# Patient Record
Sex: Male | Born: 1955
Health system: Southern US, Community
[De-identification: ages and names within clinical notes are randomized; demographics above are authoritative.]

## PROBLEM LIST (undated history)

## (undated) DIAGNOSIS — I420 Dilated cardiomyopathy: Secondary | ICD-10-CM

## (undated) DIAGNOSIS — R51 Headache: Secondary | ICD-10-CM

## (undated) DIAGNOSIS — Z9289 Personal history of other medical treatment: Secondary | ICD-10-CM

## (undated) DIAGNOSIS — F329 Major depressive disorder, single episode, unspecified: Secondary | ICD-10-CM

## (undated) DIAGNOSIS — R519 Headache, unspecified: Secondary | ICD-10-CM

## (undated) DIAGNOSIS — F32A Depression, unspecified: Secondary | ICD-10-CM

## (undated) DIAGNOSIS — E7800 Pure hypercholesterolemia, unspecified: Secondary | ICD-10-CM

## (undated) DIAGNOSIS — N184 Chronic kidney disease, stage 4 (severe): Secondary | ICD-10-CM

## (undated) DIAGNOSIS — Z5900 Homelessness unspecified: Secondary | ICD-10-CM

## (undated) DIAGNOSIS — Z9119 Patient's noncompliance with other medical treatment and regimen: Secondary | ICD-10-CM

## (undated) DIAGNOSIS — R3911 Hesitancy of micturition: Secondary | ICD-10-CM

## (undated) DIAGNOSIS — Z91199 Patient's noncompliance with other medical treatment and regimen due to unspecified reason: Secondary | ICD-10-CM

## (undated) DIAGNOSIS — J189 Pneumonia, unspecified organism: Secondary | ICD-10-CM

## (undated) DIAGNOSIS — F419 Anxiety disorder, unspecified: Secondary | ICD-10-CM

## (undated) DIAGNOSIS — M1990 Unspecified osteoarthritis, unspecified site: Secondary | ICD-10-CM

## (undated) DIAGNOSIS — I214 Non-ST elevation (NSTEMI) myocardial infarction: Secondary | ICD-10-CM

## (undated) DIAGNOSIS — F1910 Other psychoactive substance abuse, uncomplicated: Secondary | ICD-10-CM

## (undated) DIAGNOSIS — I255 Ischemic cardiomyopathy: Secondary | ICD-10-CM

## (undated) DIAGNOSIS — I219 Acute myocardial infarction, unspecified: Secondary | ICD-10-CM

## (undated) DIAGNOSIS — I10 Essential (primary) hypertension: Secondary | ICD-10-CM

## (undated) HISTORY — DX: Dilated cardiomyopathy: I42.0

---

## 2013-02-18 ENCOUNTER — Encounter (HOSPITAL_COMMUNITY): Payer: Self-pay | Admitting: *Deleted

## 2013-02-18 ENCOUNTER — Emergency Department (HOSPITAL_COMMUNITY)
Admission: EM | Admit: 2013-02-18 | Discharge: 2013-02-18 | Disposition: A | Discharge status: Home / Self Care | Payer: Self-pay | Attending: Emergency Medicine | Admitting: Emergency Medicine

## 2013-02-18 DIAGNOSIS — Z87891 Personal history of nicotine dependence: Secondary | ICD-10-CM | POA: Insufficient documentation

## 2013-02-18 DIAGNOSIS — R079 Chest pain, unspecified: Secondary | ICD-10-CM | POA: Insufficient documentation

## 2013-02-18 DIAGNOSIS — M25519 Pain in unspecified shoulder: Secondary | ICD-10-CM | POA: Insufficient documentation

## 2013-02-18 DIAGNOSIS — H9209 Otalgia, unspecified ear: Secondary | ICD-10-CM | POA: Insufficient documentation

## 2013-02-18 DIAGNOSIS — H9201 Otalgia, right ear: Secondary | ICD-10-CM

## 2013-02-18 DIAGNOSIS — M542 Cervicalgia: Secondary | ICD-10-CM | POA: Insufficient documentation

## 2013-02-18 DIAGNOSIS — I10 Essential (primary) hypertension: Secondary | ICD-10-CM | POA: Insufficient documentation

## 2013-02-18 DIAGNOSIS — Z7982 Long term (current) use of aspirin: Secondary | ICD-10-CM | POA: Insufficient documentation

## 2013-02-18 DIAGNOSIS — M25512 Pain in left shoulder: Secondary | ICD-10-CM

## 2013-02-18 DIAGNOSIS — Z79899 Other long term (current) drug therapy: Secondary | ICD-10-CM | POA: Insufficient documentation

## 2013-02-18 HISTORY — DX: Essential (primary) hypertension: I10

## 2013-02-18 LAB — POCT I-STAT TROPONIN I

## 2013-02-18 MED ORDER — HYDROCODONE-ACETAMINOPHEN 5-325 MG PO TABS
2.0000 | ORAL_TABLET | Freq: Once | ORAL | Status: AC
  Administered 2013-02-18: 2 via ORAL
  Filled 2013-02-18: qty 2

## 2013-02-18 MED ORDER — NAPROXEN 375 MG PO TABS: 375.0000 mg | ORAL_TABLET | Freq: Two times a day (BID) | ORAL | Status: DC

## 2013-02-18 MED ORDER — ONDANSETRON 4 MG PO TBDP
4.0000 mg | ORAL_TABLET | Freq: Once | ORAL | Status: AC
  Administered 2013-02-18: 4 mg via ORAL
  Filled 2013-02-18: qty 1

## 2013-02-18 NOTE — ED Provider Notes (Signed)
CSN: [id number]     Arrival date & time 02/18/13  1703 History     First MD Initiated Contact with Patient 02/18/13 1905     Chief Complaint  Patient presents with  . Otalgia  . Neck Pain  . Chest Pain   (Consider location/radiation/quality/duration/timing/severity/associated sxs/prior Treatment) HPI   [name] is a 57 y.o.male with a significant PMH of hypertension presents to the ER with complaints of pt complains of possible foreign body in ear from 4 weeks ago and left shoulder pain for 3-4 weeks.  Ear pain- patient was eating in his right ear with a plastic fork and a piece of it broke off. He did not have any bleeding him a discharge or decrease in hearing. He says that he does not know what happened to it intermittently his ear has pain.  Shoulder pain- patient is a Retail buyer and says that for the past 3-4 weeks his left side of his neck, shoulder, left arm, left back has been causing him discomfort only when he moves. He has no pain or discomfort when he is at rest. He is tried Aleve with some relief. He denies ever having this before but says he gets real sore after work. Denies any shortness of breath, wheezing, chest pain, nausea, diaphoresis, lethargy.  Past Medical History  Diagnosis Date  . Hypertension    History reviewed. No pertinent past surgical history. No family history on file. History  Substance Use Topics  . Smoking status: Former Smoker    Quit date: 02/19/2004  . Smokeless tobacco: Not on file  . Alcohol Use: Yes    Review of Systems ROS is negative unless otherwise stated in the HPI\  Allergies  Review of patient's allergies indicates no known allergies.  Home Medications   Current Outpatient Rx  Name  Route  Sig  Dispense  Refill  . aspirin EC 81 MG tablet   Oral   Take 81 mg by mouth daily.         . hydrochlorothiazide (HYDRODIURIL) 25 MG tablet   Oral   Take 25 mg by mouth daily.         . naproxen (NAPROSYN) 375 MG tablet  Oral   Take 1 tablet (375 mg total) by mouth 2 (two) times daily with a meal.   40 tablet   0    BP 178/97  Pulse 58  Temp(Src) 98 F (36.7 C) (Oral)  Resp 16  SpO2 100% Physical Exam  Nursing note and vitals reviewed. Constitutional: He appears well-developed and well-nourished. No distress.  HENT:  Head: Normocephalic and atraumatic.  Eyes: Pupils are equal, round, and reactive to light.  Neck: Normal range of motion. Neck supple.  Cardiovascular: Normal rate and regular rhythm.   Pulmonary/Chest: Effort normal.  Abdominal: Soft.  Musculoskeletal:       Left shoulder: He exhibits pain. He exhibits normal range of motion, no tenderness, no bony tenderness, no swelling, no effusion, no crepitus, no deformity, no laceration, no spasm, normal pulse and normal strength.  Pain with ROM  Neurological: He is alert.  Skin: Skin is warm and dry.    ED Course   Procedures (including critical care time)  Labs Reviewed  POCT I-STAT TROPONIN I   No results found. 1. Otalgia of right ear   2. Shoulder joint pain, left     MDM   Date: 02/18/2013  Rate: 60  Rhythm: sinus rhythm  QRS Axis: normal  Intervals: normal  ST/T Wave  abnormalities: normal  Conduction Disutrbances: right bundle branch block  Narrative Interpretation:   Old EKG Reviewed: unavailable  Patient given 2 Vicodin in the emergency department he said he has significantly helped his pain. Will start him on Naprosyn twice a day and he's been told to use food when he takes this otherwise he is at risk for stomach upset.   57 y.o.[name]'s evaluation in the Emergency Department is complete. It has been determined that no acute conditions requiring further emergency intervention are present at this time. The patient/guardian have been advised of the diagnosis and plan. We have discussed signs and symptoms that warrant return to the ED, such as changes or worsening in symptoms.  Vital signs are stable at  discharge. Filed Vitals:   02/18/13 2114  BP: 178/97  Pulse: 60  Temp:   Resp: 17    Patient/guardian has voiced understanding and agreed to follow-up with the PCP or specialist.    Linus Mako, PA-C 02/19/13 0012  Linus Mako, PA-C 02/19/13 [id number]

## 2013-02-18 NOTE — ED Notes (Signed)
Pt BP elevated; pt stated that he ran out of BP medication since Saturday. Will advise EDP

## 2013-02-18 NOTE — ED Notes (Signed)
Pt c/o R ear pain r/t when he was digging in his hear with the broken-off prong of a fork and it lodged in his ear (several weeks ago).  Pt also c/o L neck pain that radiates down shoulder and to L back (pain increases with movement).

## 2013-02-19 NOTE — ED Provider Notes (Signed)
Medical screening examination/treatment/procedure(s) were performed by non-physician practitioner and as supervising physician I was immediately available for consultation/collaboration.  Hoy Morn, MD 02/19/13 [phone]

## 2014-07-18 DIAGNOSIS — I214 Non-ST elevation (NSTEMI) myocardial infarction: Secondary | ICD-10-CM

## 2014-07-18 DIAGNOSIS — I420 Dilated cardiomyopathy: Secondary | ICD-10-CM

## 2014-07-18 DIAGNOSIS — J189 Pneumonia, unspecified organism: Secondary | ICD-10-CM

## 2014-07-18 HISTORY — DX: Pneumonia, unspecified organism: J18.9

## 2014-07-18 HISTORY — DX: Non-ST elevation (NSTEMI) myocardial infarction: I21.4

## 2014-07-18 HISTORY — DX: Dilated cardiomyopathy: I42.0

## 2014-08-13 ENCOUNTER — Encounter (HOSPITAL_COMMUNITY): Payer: Self-pay | Admitting: *Deleted

## 2014-08-13 ENCOUNTER — Emergency Department (HOSPITAL_COMMUNITY): Payer: Medicaid Other

## 2014-08-13 ENCOUNTER — Inpatient Hospital Stay (HOSPITAL_COMMUNITY)
Admission: EM | Admit: 2014-08-13 | Discharge: 2014-08-16 | DRG: 287 | Disposition: A | Discharge status: Home / Self Care | Payer: Medicaid Other | Attending: Cardiology | Admitting: Cardiology

## 2014-08-13 DIAGNOSIS — R0602 Shortness of breath: Secondary | ICD-10-CM

## 2014-08-13 DIAGNOSIS — Z590 Homelessness: Secondary | ICD-10-CM | POA: Diagnosis not present

## 2014-08-13 DIAGNOSIS — I249 Acute ischemic heart disease, unspecified: Secondary | ICD-10-CM | POA: Diagnosis present

## 2014-08-13 DIAGNOSIS — F1010 Alcohol abuse, uncomplicated: Secondary | ICD-10-CM | POA: Diagnosis present

## 2014-08-13 DIAGNOSIS — I739 Peripheral vascular disease, unspecified: Secondary | ICD-10-CM | POA: Diagnosis present

## 2014-08-13 DIAGNOSIS — F1410 Cocaine abuse, uncomplicated: Secondary | ICD-10-CM | POA: Diagnosis present

## 2014-08-13 DIAGNOSIS — I4510 Unspecified right bundle-branch block: Secondary | ICD-10-CM | POA: Diagnosis present

## 2014-08-13 DIAGNOSIS — I129 Hypertensive chronic kidney disease with stage 1 through stage 4 chronic kidney disease, or unspecified chronic kidney disease: Secondary | ICD-10-CM | POA: Diagnosis present

## 2014-08-13 DIAGNOSIS — R7989 Other specified abnormal findings of blood chemistry: Secondary | ICD-10-CM

## 2014-08-13 DIAGNOSIS — Z87891 Personal history of nicotine dependence: Secondary | ICD-10-CM | POA: Diagnosis not present

## 2014-08-13 DIAGNOSIS — F419 Anxiety disorder, unspecified: Secondary | ICD-10-CM | POA: Diagnosis present

## 2014-08-13 DIAGNOSIS — R778 Other specified abnormalities of plasma proteins: Secondary | ICD-10-CM

## 2014-08-13 DIAGNOSIS — N184 Chronic kidney disease, stage 4 (severe): Secondary | ICD-10-CM | POA: Diagnosis present

## 2014-08-13 DIAGNOSIS — N179 Acute kidney failure, unspecified: Secondary | ICD-10-CM | POA: Diagnosis present

## 2014-08-13 DIAGNOSIS — I420 Dilated cardiomyopathy: Secondary | ICD-10-CM | POA: Diagnosis present

## 2014-08-13 DIAGNOSIS — E119 Type 2 diabetes mellitus without complications: Secondary | ICD-10-CM | POA: Diagnosis present

## 2014-08-13 LAB — CBC WITH DIFFERENTIAL/PLATELET
Basophils Absolute: 0 10*3/uL (ref 0.0–0.1)
Basophils Relative: 1 % (ref 0–1)
EOS ABS: 0.1 10*3/uL (ref 0.0–0.7)
Eosinophils Relative: 1 % (ref 0–5)
HEMATOCRIT: 44.7 % (ref 39.0–52.0)
HEMOGLOBIN: 15.1 g/dL (ref 13.0–17.0)
LYMPHS ABS: 1.9 10*3/uL (ref 0.7–4.0)
LYMPHS PCT: 35 % (ref 12–46)
MCH: 34.4 pg — ABNORMAL HIGH (ref 26.0–34.0)
MCHC: 33.8 g/dL (ref 30.0–36.0)
MCV: 101.8 fL — ABNORMAL HIGH (ref 78.0–100.0)
MONOS PCT: 8 % (ref 3–12)
Monocytes Absolute: 0.5 10*3/uL (ref 0.1–1.0)
NEUTROS PCT: 55 % (ref 43–77)
Neutro Abs: 3.1 10*3/uL (ref 1.7–7.7)
PLATELETS: 215 10*3/uL (ref 150–400)
RBC: 4.39 MIL/uL (ref 4.22–5.81)
RDW: 12.3 % (ref 11.5–15.5)
WBC: 5.6 10*3/uL (ref 4.0–10.5)

## 2014-08-13 LAB — BASIC METABOLIC PANEL
Anion gap: 7 (ref 5–15)
Anion gap: 9 (ref 5–15)
Anion gap: 9 (ref 5–15)
BUN: 25 mg/dL — AB (ref 6–23)
BUN: 24 mg/dL — ABNORMAL HIGH (ref 6–23)
BUN: 27 mg/dL — AB (ref 6–23)
CO2: 23 mmol/L (ref 19–32)
CO2: 20 mmol/L (ref 19–32)
CO2: 20 mmol/L (ref 19–32)
CREATININE: 1.93 mg/dL — AB (ref 0.50–1.35)
CREATININE: 1.92 mg/dL — AB (ref 0.50–1.35)
Calcium: 8.9 mg/dL (ref 8.4–10.5)
Calcium: 8.8 mg/dL (ref 8.4–10.5)
Calcium: 8.8 mg/dL (ref 8.4–10.5)
Chloride: 107 mmol/L (ref 96–112)
Chloride: 108 mmol/L (ref 96–112)
Chloride: 108 mmol/L (ref 96–112)
Creatinine, Ser: 1.94 mg/dL — ABNORMAL HIGH (ref 0.50–1.35)
GFR calc Af Amer: 42 mL/min — ABNORMAL LOW (ref >90)
GFR calc Af Amer: 42 mL/min — ABNORMAL LOW (ref >90)
GFR calc Af Amer: 42 mL/min — ABNORMAL LOW (ref >90)
GFR calc non Af Amer: 36 mL/min — ABNORMAL LOW (ref >90)
GFR calc non Af Amer: 36 mL/min — ABNORMAL LOW (ref >90)
GFR, EST NON AFRICAN AMERICAN: 36 mL/min — AB (ref >90)
GLUCOSE: 108 mg/dL — AB (ref 70–99)
Glucose, Bld: 174 mg/dL — ABNORMAL HIGH (ref 70–99)
Glucose, Bld: 151 mg/dL — ABNORMAL HIGH (ref 70–99)
POTASSIUM: 4 mmol/L (ref 3.5–5.1)
POTASSIUM: 4.6 mmol/L (ref 3.5–5.1)
POTASSIUM: 4.6 mmol/L (ref 3.5–5.1)
SODIUM: 137 mmol/L (ref 135–145)
SODIUM: 137 mmol/L (ref 135–145)
SODIUM: 137 mmol/L (ref 135–145)

## 2014-08-13 LAB — APTT: aPTT: 26 seconds (ref 24–37)

## 2014-08-13 LAB — TROPONIN I
TROPONIN I: 1.74 ng/mL — AB (ref <0.031)
Troponin I: 1.64 ng/mL (ref <0.031)
Troponin I: 0.64 ng/mL (ref <0.031)

## 2014-08-13 LAB — HEPARIN LEVEL (UNFRACTIONATED): Heparin Unfractionated: <0.1 IU/mL — ABNORMAL LOW (ref 0.30–0.70)

## 2014-08-13 LAB — PROTIME-INR
INR: 1.16 (ref 0.00–1.49)
Prothrombin Time: 14.9 seconds (ref 11.6–15.2)

## 2014-08-13 LAB — MRSA PCR SCREENING: MRSA by PCR: NEGATIVE

## 2014-08-13 MED ORDER — NITROGLYCERIN 0.4 MG SL SUBL: 0.4000 mg | SUBLINGUAL_TABLET | SUBLINGUAL | Status: DC | PRN

## 2014-08-13 MED ORDER — MORPHINE SULFATE 2 MG/ML IJ SOLN: 2.0000 mg | Freq: Four times a day (QID) | INTRAMUSCULAR | Status: DC | PRN

## 2014-08-13 MED ORDER — METOPROLOL TARTRATE 50 MG PO TABS
50.0000 mg | ORAL_TABLET | Freq: Two times a day (BID) | ORAL | Status: DC
  Administered 2014-08-13 – 2014-08-14 (×2): 50 mg via ORAL
  Filled 2014-08-13 (×2): qty 1

## 2014-08-13 MED ORDER — HEPARIN (PORCINE) IN NACL 100-0.45 UNIT/ML-% IJ SOLN
950.0000 [IU]/h | INTRAMUSCULAR | Status: DC
  Administered 2014-08-13: 950 [IU]/h via INTRAVENOUS
  Filled 2014-08-13 (×2): qty 250

## 2014-08-13 MED ORDER — ASPIRIN 81 MG PO CHEW
324.0000 mg | CHEWABLE_TABLET | Freq: Once | ORAL | Status: DC
  Filled 2014-08-13: qty 4

## 2014-08-13 MED ORDER — ASPIRIN EC 81 MG PO TBEC
81.0000 mg | DELAYED_RELEASE_TABLET | Freq: Every day | ORAL | Status: DC
  Administered 2014-08-16: 81 mg via ORAL
  Filled 2014-08-13: qty 1

## 2014-08-13 MED ORDER — HEPARIN BOLUS VIA INFUSION
4000.0000 [IU] | Freq: Once | INTRAVENOUS | Status: AC
  Administered 2014-08-13: 4000 [IU] via INTRAVENOUS
  Filled 2014-08-13: qty 4000

## 2014-08-13 MED ORDER — SODIUM CHLORIDE 0.9 % IV SOLN
1.0000 mL/kg/h | INTRAVENOUS | Status: DC
  Administered 2014-08-13: 1 mL/kg/h via INTRAVENOUS

## 2014-08-13 MED ORDER — METOPROLOL TARTRATE 25 MG PO TABS
25.0000 mg | ORAL_TABLET | Freq: Two times a day (BID) | ORAL | Status: DC
  Administered 2014-08-13: 25 mg via ORAL
  Filled 2014-08-13: qty 1

## 2014-08-13 MED ORDER — HEPARIN (PORCINE) IN NACL 100-0.45 UNIT/ML-% IJ SOLN
1150.0000 [IU]/h | INTRAMUSCULAR | Status: DC
  Administered 2014-08-13 – 2014-08-14 (×2): 1150 [IU]/h via INTRAVENOUS
  Filled 2014-08-13: qty 250

## 2014-08-13 MED ORDER — ASPIRIN 81 MG PO CHEW
324.0000 mg | CHEWABLE_TABLET | ORAL | Status: AC
  Filled 2014-08-13: qty 4

## 2014-08-13 MED ORDER — LORAZEPAM 1 MG PO TABS
1.0000 mg | ORAL_TABLET | Freq: Four times a day (QID) | ORAL | Status: DC | PRN
  Administered 2014-08-13 (×2): 1 mg via ORAL
  Filled 2014-08-13 (×2): qty 1

## 2014-08-13 MED ORDER — ACETAMINOPHEN 325 MG PO TABS: 650.0000 mg | ORAL_TABLET | ORAL | Status: DC | PRN

## 2014-08-13 MED ORDER — NITROGLYCERIN IN D5W 200-5 MCG/ML-% IV SOLN
5.0000 ug/min | INTRAVENOUS | Status: DC
  Administered 2014-08-13 – 2014-08-14 (×2): 5 ug/min via INTRAVENOUS
  Filled 2014-08-13 (×2): qty 250

## 2014-08-13 MED ORDER — ASPIRIN 300 MG RE SUPP: 300.0000 mg | RECTAL | Status: AC

## 2014-08-13 MED ORDER — ASPIRIN 81 MG PO CHEW
81.0000 mg | CHEWABLE_TABLET | ORAL | Status: AC
  Administered 2014-08-14: 81 mg via ORAL
  Filled 2014-08-13: qty 1

## 2014-08-13 MED ORDER — CLOPIDOGREL BISULFATE 75 MG PO TABS
75.0000 mg | ORAL_TABLET | Freq: Every day | ORAL | Status: DC
Start: 2014-08-14 — End: 2014-08-15
  Administered 2014-08-14 – 2014-08-15 (×2): 75 mg via ORAL
  Filled 2014-08-13 (×3): qty 1

## 2014-08-13 MED ORDER — ONDANSETRON HCL 4 MG/2ML IJ SOLN: 4.0000 mg | Freq: Four times a day (QID) | INTRAMUSCULAR | Status: DC | PRN

## 2014-08-13 MED ORDER — SODIUM CHLORIDE 0.9 % IJ SOLN
3.0000 mL | Freq: Two times a day (BID) | INTRAMUSCULAR | Status: DC
  Administered 2014-08-14: 3 mL via INTRAVENOUS

## 2014-08-13 MED ORDER — ASPIRIN 81 MG PO CHEW
324.0000 mg | CHEWABLE_TABLET | Freq: Once | ORAL | Status: AC
  Administered 2014-08-13: 324 mg via ORAL

## 2014-08-13 MED ORDER — HEPARIN BOLUS VIA INFUSION
2000.0000 [IU] | Freq: Once | INTRAVENOUS | Status: AC
  Administered 2014-08-13: 2000 [IU] via INTRAVENOUS
  Filled 2014-08-13: qty 2000

## 2014-08-13 MED ORDER — SODIUM CHLORIDE 0.9 % IV SOLN: 250.0000 mL | INTRAVENOUS | Status: DC | PRN

## 2014-08-13 MED ORDER — ATORVASTATIN CALCIUM 80 MG PO TABS
80.0000 mg | ORAL_TABLET | Freq: Every day | ORAL | Status: DC
  Administered 2014-08-13 – 2014-08-15 (×3): 80 mg via ORAL
  Filled 2014-08-13 (×4): qty 1

## 2014-08-13 MED ORDER — ALPRAZOLAM 0.25 MG PO TABS
0.2500 mg | ORAL_TABLET | Freq: Two times a day (BID) | ORAL | Status: DC | PRN
  Administered 2014-08-13 (×2): 0.25 mg via ORAL
  Filled 2014-08-13 (×3): qty 1

## 2014-08-13 MED ORDER — SODIUM CHLORIDE 0.9 % IV SOLN
INTRAVENOUS | Status: DC
  Administered 2014-08-13: 10:00:00 via INTRAVENOUS

## 2014-08-13 MED ORDER — CLOPIDOGREL BISULFATE 75 MG PO TABS
300.0000 mg | ORAL_TABLET | Freq: Once | ORAL | Status: AC
  Administered 2014-08-13: 300 mg via ORAL
  Filled 2014-08-13: qty 4

## 2014-08-13 MED ORDER — SODIUM CHLORIDE 0.9 % IJ SOLN: 3.0000 mL | INTRAMUSCULAR | Status: DC | PRN

## 2014-08-13 MED ORDER — PANTOPRAZOLE SODIUM 40 MG PO TBEC
40.0000 mg | DELAYED_RELEASE_TABLET | Freq: Every day | ORAL | Status: DC
  Administered 2014-08-13 – 2014-08-16 (×3): 40 mg via ORAL
  Filled 2014-08-13 (×3): qty 1

## 2014-08-13 NOTE — ED Provider Notes (Signed)
CSN: [id number]     Arrival date & time 08/13/14  [id number] History   First MD Initiated Contact with Patient 08/13/14 [phone]     Chief Complaint  Patient presents with  . Shortness of Breath     (Consider location/radiation/quality/duration/timing/severity/associated sxs/prior Treatment) HPI Comments: This is 59 year old male.  He is been homeless for the past 6 months.  He's been intermittently staying with a friend or living in the car in the yard reports that for the past week he's had some shortness of breath, cough.  Denies any fever.  He states before being homeless.  He was taking blood pressure medicine on a regular basis.  He has not taken this in over 3 years.  Tonight presents with cough, shortness of breath with exertion, anxiety.  She is tearful, stating I'm afraid I'm going to die."  Patient is a 59 y.o. male presenting with anxiety. The history is provided by the patient.  Anxiety This is a new problem. The current episode started today. The problem occurs constantly. The problem has been unchanged. Associated symptoms include congestion and coughing. Pertinent negatives include no abdominal pain, chills, fever, headaches, nausea, rash or weakness. Associated symptoms comments: Cough, SOB. The symptoms are aggravated by exertion. He has tried nothing for the symptoms. The treatment provided no relief.    Past Medical History  Diagnosis Date  . Hypertension    History reviewed. No pertinent past surgical history. No family history on file. History  Substance Use Topics  . Smoking status: Former Smoker    Quit date: 02/19/2004  . Smokeless tobacco: Not on file  . Alcohol Use: Yes    Review of Systems  Constitutional: Negative for fever and chills.  HENT: Positive for congestion.   Respiratory: Positive for cough, chest tightness and shortness of breath. Negative for wheezing.   Cardiovascular: Negative for leg swelling.  Gastrointestinal: Negative for nausea and abdominal  pain.  Skin: Negative for rash.  Neurological: Negative for weakness and headaches.  Psychiatric/Behavioral: The patient is nervous/anxious.   All other systems reviewed and are negative.     Allergies  Review of patient's allergies indicates no known allergies.  Home Medications   Prior to Admission medications   Medication Sig Start Date End Date Taking? Authorizing Provider  aspirin 325 MG tablet Take 325 mg by mouth every 4 (four) hours as needed (for pain.).   Yes Historical Provider, MD  naproxen (NAPROSYN) 375 MG tablet Take 1 tablet (375 mg total) by mouth 2 (two) times daily with a meal. Patient not taking: Reported on 08/13/2014 02/18/13   Linus Mako, PA-C   BP 105/80 mmHg  Pulse 110  Temp(Src) 97.8 F (36.6 C) (Oral)  Resp 26  Ht 5\' 8"  (1.727 m)  Wt 174 lb 4.8 oz (79.062 kg)  BMI 26.51 kg/m2  SpO2 97% Physical Exam  Constitutional: He is oriented to person, place, and time. He appears well-developed and well-nourished.  HENT:  Head: Normocephalic.  Eyes: Pupils are equal, round, and reactive to light.  Neck: Normal range of motion.  Cardiovascular: Regular rhythm.  Tachycardia present.   Pulmonary/Chest: Effort normal and breath sounds normal. No respiratory distress. He has no wheezes. He exhibits no tenderness.  Musculoskeletal: He exhibits no edema or tenderness.  Neurological: He is alert and oriented to person, place, and time.  Skin: Skin is warm and dry.  Psychiatric: His speech is normal and behavior is normal. Judgment and thought content normal. His mood appears anxious. Cognition  and memory are normal.  Nursing note and vitals reviewed.   ED Course  Procedures (including critical care time) Labs Review Labs Reviewed  CBC WITH DIFFERENTIAL/PLATELET - Abnormal; Notable for the following:    MCV 101.8 (*)    MCH 34.4 (*)    All other components within normal limits  TROPONIN I - Abnormal; Notable for the following:    Troponin I 1.74 (*)     All other components within normal limits  BASIC METABOLIC PANEL - Abnormal; Notable for the following:    Glucose, Bld 108 (*)    BUN 25 (*)    Creatinine, Ser 1.93 (*)    GFR calc non Af Amer 36 (*)    GFR calc Af Amer 42 (*)    All other components within normal limits  TROPONIN I - Abnormal; Notable for the following:    Troponin I 1.64 (*)    All other components within normal limits  HEPARIN LEVEL (UNFRACTIONATED) - Abnormal; Notable for the following:    Heparin Unfractionated <0.10 (*)    All other components within normal limits  BASIC METABOLIC PANEL - Abnormal; Notable for the following:    Glucose, Bld 174 (*)    BUN 24 (*)    Creatinine, Ser 1.92 (*)    GFR calc non Af Amer 36 (*)    GFR calc Af Amer 42 (*)    All other components within normal limits  TROPONIN I - Abnormal; Notable for the following:    Troponin I 0.64 (*)    All other components within normal limits  MRSA PCR SCREENING  APTT  PROTIME-INR  HEPARIN LEVEL (UNFRACTIONATED)  COMPREHENSIVE METABOLIC PANEL  CBC WITH DIFFERENTIAL/PLATELET  HEMOGLOBIN A1C  HEPARIN LEVEL (UNFRACTIONATED)  CBC  BASIC METABOLIC PANEL  LIPID PANEL  HEPARIN LEVEL (UNFRACTIONATED)  BASIC METABOLIC PANEL    Imaging Review Dg Chest 2 View  08/13/2014   CLINICAL DATA:  Acute onset of shortness of breath and chest tightness. Initial encounter.  EXAM: CHEST  2 VIEW  COMPARISON:  None.  FINDINGS: The lungs are well-aerated. Peribronchial thickening is noted. Minimal bibasilar atelectasis is seen. There is no evidence of pleural effusion or pneumothorax.  The heart is mildly enlarged. No acute osseous abnormalities are seen.  IMPRESSION: Peribronchial thickening noted. Minimal bibasilar atelectasis seen. Mild cardiomegaly.   Electronically Signed   By: Garald Balding M.D.   On: 08/13/2014 03:09     EKG Interpretation   Date/Time:  Wednesday August 13 2014 05:36:57 EST Ventricular Rate:  98 PR Interval:  203 QRS Duration:  162 QT Interval:  397 QTC Calculation: 507 R Axis:   -112 Text Interpretation:  Sinus rhythm Borderline prolonged PR interval Left  atrial enlargement Right bundle branch block Anterior infarct, age  indeterminate Confirmed by Lita Mains  MD, DAVID ([id number]) on 08/13/2014  5:40:13 AM      MDM   Final diagnoses:  SOB (shortness of breath)  Elevated troponin         Garald Balding, NP 08/13/14 1956  Julianne Rice, MD 08/16/14 [phone]

## 2014-08-13 NOTE — Progress Notes (Addendum)
ANTICOAGULATION CONSULT NOTE - Follow Up Consult  Pharmacy Consult for heparin Indication: chest pain/ACS  No Known Allergies  Patient Measurements: Height: 5\' 8"  (172.7 cm) Weight: 174 lb 4.8 oz (79.062 kg) IBW/kg (Calculated) : 68.4  Vital Signs: BP: 141/95 mmHg (01/27 1225) Pulse Rate: 110 (01/27 1226)  Labs:  Recent Labs  08/13/14 0506 08/13/14 0622 08/13/14 0702 08/13/14 1400 08/13/14 1500  HGB 15.1  --   --   --   --   HCT 44.7  --   --   --   --   PLT 215  --   --   --   --   APTT  --   --  26  --   --   LABPROT  --   --  14.9  --   --   INR  --   --  1.16  --   --   HEPARINUNFRC  --   --   --   --  <0.10*  CREATININE 1.93*  --   --  1.92*  --   TROPONINI 1.74* 1.64*  --   --   --     Estimated Creatinine Clearance: 39.6 mL/min (by C-G formula based on Cr of 1.92).   Medications:  Scheduled:  . aspirin  324 mg Oral NOW   Or  . aspirin  300 mg Rectal NOW  . [START ON 08/14/2014] aspirin  81 mg Oral Pre-Cath  . [START ON 08/14/2014] aspirin EC  81 mg Oral Daily  . atorvastatin  80 mg Oral q1800  . [START ON 08/14/2014] clopidogrel  75 mg Oral Q breakfast  . metoprolol tartrate  50 mg Oral BID  . pantoprazole  40 mg Oral Q0600  . sodium chloride  3 mL Intravenous Q12H   Infusions:  . sodium chloride    . sodium chloride 75 mL/hr at 08/13/14 1013  . heparin 950 Units/hr (08/13/14 1200)  . nitroGLYCERIN 5 mcg/min (08/13/14 1211)    Assessment: 59 yo male with CP and positive troponins on heparin and at 950 units/hr and initial level is undetectable. Plans note for cath on 08/14/14. RN reports that the IV line has occluded a number of times due to frequent patient movement.  Goal of Therapy:  Heparin level 0.3-0.7 units/ml Monitor platelets by anticoagulation protocol: Yes   Plan:  -Heparin bolus 2000 units then increase infusion to 1150 units/hr -Heparin level in 8 hours and daily wth CBC daily  Hildred Laser, Pharm D 08/13/2014 4:54 PM

## 2014-08-13 NOTE — ED Notes (Signed)
Report given to [facility] 

## 2014-08-13 NOTE — ED Notes (Signed)
Per EMS, pt reports anxiety attack.  Has been homeless for 6 months now and has been going through a lot of stress.  Per EMS, pt was hyperventilating.

## 2014-08-13 NOTE — ED Notes (Signed)
Bed: [id number] Expected date:  Expected time:  Means of arrival:  Comments: EMS 59yo M, anxiety attack, increased stress

## 2014-08-13 NOTE — ED Provider Notes (Signed)
Pt care assumed from [place] at shift change. Pt with months of chest discomfort. EKG unchanged. XR negative for acute findings. Awaiting labs results.  Pt with positive troponin 1.74. Dr. Lita Mains to consult cardiologist and will admit.   Results for orders placed or performed during the hospital encounter of 08/13/14  CBC with Differential  Result Value Ref Range   WBC 5.6 4.0 - 10.5 K/uL   RBC 4.39 4.22 - 5.81 MIL/uL   Hemoglobin 15.1 13.0 - 17.0 g/dL   HCT 44.7 39.0 - 52.0 %   MCV 101.8 (H) 78.0 - 100.0 fL   MCH 34.4 (H) 26.0 - 34.0 pg   MCHC 33.8 30.0 - 36.0 g/dL   RDW 12.3 11.5 - 15.5 %   Platelets 215 150 - 400 K/uL   Neutrophils Relative % 55 43 - 77 %   Neutro Abs 3.1 1.7 - 7.7 K/uL   Lymphocytes Relative 35 12 - 46 %   Lymphs Abs 1.9 0.7 - 4.0 K/uL   Monocytes Relative 8 3 - 12 %   Monocytes Absolute 0.5 0.1 - 1.0 K/uL   Eosinophils Relative 1 0 - 5 %   Eosinophils Absolute 0.1 0.0 - 0.7 K/uL   Basophils Relative 1 0 - 1 %   Basophils Absolute 0.0 0.0 - 0.1 K/uL  Troponin I  Result Value Ref Range   Troponin I 1.74 (HH) <0.031 ng/mL  Basic metabolic panel  Result Value Ref Range   Sodium 137 135 - 145 mmol/L   Potassium 4.0 3.5 - 5.1 mmol/L   Chloride 107 96 - 112 mmol/L   CO2 23 19 - 32 mmol/L   Glucose, Bld 108 (H) 70 - 99 mg/dL   BUN 25 (H) 6 - 23 mg/dL   Creatinine, Ser 1.93 (H) 0.50 - 1.35 mg/dL   Calcium 8.9 8.4 - 10.5 mg/dL   GFR calc non Af Amer 36 (L) >90 mL/min   GFR calc Af Amer 42 (L) >90 mL/min   Anion gap 7 5 - 15   Dg Chest 2 View  08/13/2014   CLINICAL DATA:  Acute onset of shortness of breath and chest tightness. Initial encounter.  EXAM: CHEST  2 VIEW  COMPARISON:  None.  FINDINGS: The lungs are well-aerated. Peribronchial thickening is noted. Minimal bibasilar atelectasis is seen. There is no evidence of pleural effusion or pneumothorax.  The heart is mildly enlarged. No acute osseous abnormalities are seen.  IMPRESSION: Peribronchial  thickening noted. Minimal bibasilar atelectasis seen. Mild cardiomegaly.   Electronically Signed   By: Garald Balding M.D.   On: 08/13/2014 03:09    Harvie Heck, PA-C 08/13/14 0700  Julianne Rice, MD 08/13/14 [phone]

## 2014-08-13 NOTE — Progress Notes (Signed)
ANTICOAGULATION CONSULT NOTE - Initial Consult  Pharmacy Consult for heparin Indication: chest pain/ACS  No Known Allergies  Patient Measurements: Height: 5\' 8"  (172.7 cm) Weight: 170 lb (77.111 kg) IBW/kg (Calculated) : 68.4 Heparin Dosing Weight:   Vital Signs: Temp: 97.8 F (36.6 C) (01/27 0237) Temp Source: Oral (01/27 0237) BP: 134/93 mmHg (01/27 0626) Pulse Rate: 107 (01/27 0626)  Labs:  Recent Labs  08/13/14 0506  HGB 15.1  HCT 44.7  PLT 215  CREATININE 1.93*  TROPONINI 1.74*    Estimated Creatinine Clearance: 39.4 mL/min (by C-G formula based on Cr of 1.93).   Medical History: Past Medical History  Diagnosis Date  . Hypertension     Medications:  Infusions:  . heparin    . heparin      Assessment: Patient with + CE, and anxiety attack.  No oral anticoagulants noted on med rec.   Goal of Therapy:  Heparin level 0.3-0.7 units/ml Monitor platelets by anticoagulation protocol: Yes   Plan:  Heparin bolus 4000 units iv x1 Heparin drip at 950 units/hr Daily CBC Next heparin level at  [place], Myton Crowford 08/13/2014,6:31 AM

## 2014-08-13 NOTE — ED Notes (Signed)
[facility] notified of need for transport. °

## 2014-08-13 NOTE — H&P (Signed)
[name] is an 59 y.o. male.   Chief Complaint: Retrosternal chest pain/numbness associated with anxiety feeling HPI: Patient is 59 year old male with past mental history significant for hypertension history of tobacco abuse EtOH abuse cocaine abuse came to the ER complaining of retrosternal chest pain described as numbness yesterday morning which lasted approximately 40 minutes associated with mild shortness of breath and anxiety feeling and did not seek any medical attention yesterday came to the ER by EMS EKG done in the ED showed sinus tachycardia with right bundle branch block with questionable anterior infarct age undetermined and secondary ST-T wave changes. Patient was noted to have mildly elevated troponin I. Patient states he has been smoking cocaine off and on for a few years last cocaine use was yesterday. States he used to take blood pressure medicine which he has stopped as he cannot afford to buy and is homeless.  Past Medical History  Diagnosis Date  . Hypertension     History reviewed. No pertinent past surgical history.  No family history on file. Social History:  reports that he quit smoking about 10 years ago. He does not have any smokeless tobacco history on file. He reports that he drinks alcohol. He reports that he uses illicit drugs (Cocaine).  Allergies: No Known Allergies   (Not in a hospital admission)  Results for orders placed or performed during the hospital encounter of 08/13/14 (from the past 48 hour(s))  CBC with Differential     Status: Abnormal   Collection Time: 08/13/14  5:06 AM  Result Value Ref Range   WBC 5.6 4.0 - 10.5 K/uL   RBC 4.39 4.22 - 5.81 MIL/uL   Hemoglobin 15.1 13.0 - 17.0 g/dL   HCT 44.7 39.0 - 52.0 %   MCV 101.8 (H) 78.0 - 100.0 fL   MCH 34.4 (H) 26.0 - 34.0 pg   MCHC 33.8 30.0 - 36.0 g/dL   RDW 12.3 11.5 - 15.5 %   Platelets 215 150 - 400 K/uL   Neutrophils Relative % 55 43 - 77 %   Neutro Abs 3.1 1.7 - 7.7 K/uL    Lymphocytes Relative 35 12 - 46 %   Lymphs Abs 1.9 0.7 - 4.0 K/uL   Monocytes Relative 8 3 - 12 %   Monocytes Absolute 0.5 0.1 - 1.0 K/uL   Eosinophils Relative 1 0 - 5 %   Eosinophils Absolute 0.1 0.0 - 0.7 K/uL   Basophils Relative 1 0 - 1 %   Basophils Absolute 0.0 0.0 - 0.1 K/uL  Troponin I     Status: Abnormal   Collection Time: 08/13/14  5:06 AM  Result Value Ref Range   Troponin I 1.74 (HH) <0.031 ng/mL    Comment:        POSSIBLE MYOCARDIAL ISCHEMIA. SERIAL TESTING RECOMMENDED. CRITICAL RESULT CALLED TO, READ BACK BY AND VERIFIED WITH: Loleta Books [id number] @ 0600 BY J SCOTTON   Basic metabolic panel     Status: Abnormal   Collection Time: 08/13/14  5:06 AM  Result Value Ref Range   Sodium 137 135 - 145 mmol/L   Potassium 4.0 3.5 - 5.1 mmol/L   Chloride 107 96 - 112 mmol/L   CO2 23 19 - 32 mmol/L   Glucose, Bld 108 (H) 70 - 99 mg/dL   BUN 25 (H) 6 - 23 mg/dL   Creatinine, Ser 1.93 (H) 0.50 - 1.35 mg/dL   Calcium 8.9 8.4 - 10.5 mg/dL   GFR calc non Af Wyvonnia Lora  36 (L) >90 mL/min   GFR calc Af Amer 42 (L) >90 mL/min    Comment: (NOTE) The eGFR has been calculated using the CKD EPI equation. This calculation has not been validated in all clinical situations. eGFR's persistently <90 mL/min signify possible Chronic Kidney Disease.    Anion gap 7 5 - 15  Troponin I     Status: Abnormal   Collection Time: 08/13/14  6:22 AM  Result Value Ref Range   Troponin I 1.64 (HH) <0.031 ng/mL    Comment:        POSSIBLE MYOCARDIAL ISCHEMIA. SERIAL TESTING RECOMMENDED. CRITICAL VALUE NOTED.  VALUE IS CONSISTENT WITH PREVIOUSLY REPORTED AND CALLED VALUE.   APTT     Status: None   Collection Time: 08/13/14  7:02 AM  Result Value Ref Range   aPTT 26 24 - 37 seconds  Protime-INR     Status: None   Collection Time: 08/13/14  7:02 AM  Result Value Ref Range   Prothrombin Time 14.9 11.6 - 15.2 seconds   INR 1.16 0.00 - 1.49   Dg Chest 2 View  08/13/2014   CLINICAL DATA:  Acute  onset of shortness of breath and chest tightness. Initial encounter.  EXAM: CHEST  2 VIEW  COMPARISON:  None.  FINDINGS: The lungs are well-aerated. Peribronchial thickening is noted. Minimal bibasilar atelectasis is seen. There is no evidence of pleural effusion or pneumothorax.  The heart is mildly enlarged. No acute osseous abnormalities are seen.  IMPRESSION: Peribronchial thickening noted. Minimal bibasilar atelectasis seen. Mild cardiomegaly.   Electronically Signed   By: Garald Balding M.D.   On: 08/13/2014 03:09    Review of Systems  Constitutional: Negative for fever and chills.  Eyes: Negative for double vision and photophobia.  Respiratory: Positive for shortness of breath. Negative for cough, hemoptysis and sputum production.   Cardiovascular: Positive for chest pain. Negative for palpitations, orthopnea, claudication and leg swelling.  Gastrointestinal: Positive for nausea and vomiting. Negative for abdominal pain.  Genitourinary: Negative for dysuria.  Neurological: Negative for dizziness and headaches.    Blood pressure 134/93, pulse 107, temperature 97.8 F (36.6 C), temperature source Oral, resp. rate 30, height _0  (1.727 m), weight 77.111 kg (170 lb), SpO2 95 %. Physical Exam  Constitutional: He is oriented to person, place, and time.  HENT:  Head: Normocephalic and atraumatic.  Eyes: Conjunctivae are normal. Pupils are equal, round, and reactive to light. Left eye exhibits no discharge.  Neck: Normal range of motion. Neck supple. No JVD present. No tracheal deviation present. No thyromegaly present.  Cardiovascular: Normal rate and regular rhythm.   Murmur (soft systolic murmur and S4 gallop noted) heard. Respiratory: Effort normal and breath sounds normal. No respiratory distress. He has no wheezes. He has no rales.  GI: Soft. Bowel sounds are normal. He exhibits no distension. There is no tenderness.  Musculoskeletal: He exhibits no edema or tenderness.   Neurological: He is alert and oriented to person, place, and time.     Assessment/Plan Acute coronary syndrome in the setting of cocaine abuse Hypertension Tobacco abuse Cocaine abuse EtOH abuse Acute renal injury Plan As per orders Discussed with patient regarding cardiac catheterization and possible PTCA stenting its risk and benefits i.e. death MI stroke need for emergency CABG risk of restenosis local vascular complications worsening renal function etc. and consents for PCI. We'll monitor renal function closely today and schedule him for tomorrow Discussed with patient  Clent Demark 08/13/2014, 7:39 AM

## 2014-08-13 NOTE — ED Notes (Signed)
Bed: WA03 Expected date:  Expected time:  Means of arrival:  Comments: 

## 2014-08-13 NOTE — ED Notes (Signed)
Troponin 1.74 [name] from [employer]

## 2014-08-13 NOTE — Progress Notes (Signed)
CRITICAL VALUE ALERT  Critical value received:  Troponin   Date of notification:  08/13/14  Time of notification:  [id number]  Critical value read back:Yes.    Nurse who received alert:  Mickey Farber  MD notified (1st page):  Harwani  Time of first page:  18  MD notified (2nd page):  Time of second page:  Responding MD:    Time MD responded:

## 2014-08-14 LAB — BASIC METABOLIC PANEL
ANION GAP: 12 (ref 5–15)
Anion gap: 13 (ref 5–15)
BUN: 31 mg/dL — AB (ref 6–23)
BUN: 39 mg/dL — ABNORMAL HIGH (ref 6–23)
CALCIUM: 9.2 mg/dL (ref 8.4–10.5)
CHLORIDE: 106 mmol/L (ref 96–112)
CO2: 21 mmol/L (ref 19–32)
CO2: 18 mmol/L — ABNORMAL LOW (ref 19–32)
CREATININE: 2.06 mg/dL — AB (ref 0.50–1.35)
Calcium: 8.9 mg/dL (ref 8.4–10.5)
Chloride: 110 mmol/L (ref 96–112)
Creatinine, Ser: 2.27 mg/dL — ABNORMAL HIGH (ref 0.50–1.35)
GFR calc Af Amer: 39 mL/min — ABNORMAL LOW (ref >90)
GFR calc Af Amer: 34 mL/min — ABNORMAL LOW (ref >90)
GFR calc non Af Amer: 33 mL/min — ABNORMAL LOW (ref >90)
GFR calc non Af Amer: 30 mL/min — ABNORMAL LOW (ref >90)
GLUCOSE: 101 mg/dL — AB (ref 70–99)
Glucose, Bld: 119 mg/dL — ABNORMAL HIGH (ref 70–99)
Potassium: 4.9 mmol/L (ref 3.5–5.1)
Potassium: 5.4 mmol/L — ABNORMAL HIGH (ref 3.5–5.1)
Sodium: 139 mmol/L (ref 135–145)
Sodium: 141 mmol/L (ref 135–145)

## 2014-08-14 LAB — CBC
HCT: 48.6 % (ref 39.0–52.0)
HEMOGLOBIN: 16.3 g/dL (ref 13.0–17.0)
MCH: 33.7 pg (ref 26.0–34.0)
MCHC: 33.5 g/dL (ref 30.0–36.0)
MCV: 100.4 fL — ABNORMAL HIGH (ref 78.0–100.0)
PLATELETS: 235 10*3/uL (ref 150–400)
RBC: 4.84 MIL/uL (ref 4.22–5.81)
RDW: 12.3 % (ref 11.5–15.5)
WBC: 7.2 10*3/uL (ref 4.0–10.5)

## 2014-08-14 LAB — TROPONIN I: Troponin I: 0.59 ng/mL (ref <0.031)

## 2014-08-14 LAB — HEPARIN LEVEL (UNFRACTIONATED): Heparin Unfractionated: 0.51 IU/mL (ref 0.30–0.70)

## 2014-08-14 MED ORDER — HEPARIN BOLUS VIA INFUSION
2000.0000 [IU] | Freq: Once | INTRAVENOUS | Status: AC
  Administered 2014-08-14: 2000 [IU] via INTRAVENOUS
  Filled 2014-08-14: qty 2000

## 2014-08-14 MED ORDER — LORAZEPAM 2 MG/ML IJ SOLN: 1.0000 mg | Freq: Once | INTRAMUSCULAR | Status: DC

## 2014-08-14 MED ORDER — SODIUM CHLORIDE 0.9 % IV SOLN
INTRAVENOUS | Status: DC
  Administered 2014-08-14 (×2): via INTRAVENOUS

## 2014-08-14 MED ORDER — CARVEDILOL 3.125 MG PO TABS
3.1250 mg | ORAL_TABLET | Freq: Two times a day (BID) | ORAL | Status: DC
  Administered 2014-08-14 – 2014-08-16 (×4): 3.125 mg via ORAL
  Filled 2014-08-14 (×4): qty 1

## 2014-08-14 MED ORDER — LORAZEPAM 1 MG PO TABS
1.0000 mg | ORAL_TABLET | Freq: Four times a day (QID) | ORAL | Status: DC | PRN
  Administered 2014-08-15 – 2014-08-16 (×2): 1 mg via ORAL
  Filled 2014-08-14 (×2): qty 1

## 2014-08-14 MED ORDER — LORAZEPAM 2 MG/ML IJ SOLN: 1.0000 mg | Freq: Four times a day (QID) | INTRAMUSCULAR | Status: DC | PRN

## 2014-08-14 MED ORDER — LEVALBUTEROL HCL 1.25 MG/0.5ML IN NEBU
1.2500 mg | INHALATION_SOLUTION | Freq: Four times a day (QID) | RESPIRATORY_TRACT | Status: DC
  Filled 2014-08-14: qty 0.5

## 2014-08-14 MED ORDER — HALOPERIDOL LACTATE 5 MG/ML IJ SOLN
INTRAMUSCULAR | Status: AC
  Filled 2014-08-14: qty 1

## 2014-08-14 MED ORDER — SODIUM CHLORIDE 0.9 % IV SOLN: 250.0000 mL | INTRAVENOUS | Status: DC | PRN

## 2014-08-14 MED ORDER — HALOPERIDOL LACTATE 5 MG/ML IJ SOLN
1.0000 mg | INTRAMUSCULAR | Status: DC | PRN
  Administered 2014-08-14: 2 mg via INTRAMUSCULAR

## 2014-08-14 MED ORDER — RAMIPRIL 1.25 MG PO CAPS
1.2500 mg | ORAL_CAPSULE | Freq: Every day | ORAL | Status: DC
  Administered 2014-08-14 – 2014-08-16 (×3): 1.25 mg via ORAL
  Filled 2014-08-14 (×3): qty 1

## 2014-08-14 MED ORDER — LEVALBUTEROL HCL 1.25 MG/0.5ML IN NEBU
1.2500 mg | INHALATION_SOLUTION | Freq: Four times a day (QID) | RESPIRATORY_TRACT | Status: DC | PRN
  Administered 2014-08-14: 1.25 mg via RESPIRATORY_TRACT

## 2014-08-14 MED ORDER — ADULT MULTIVITAMIN W/MINERALS CH
1.0000 | ORAL_TABLET | Freq: Every day | ORAL | Status: DC
  Administered 2014-08-14 – 2014-08-16 (×3): 1 via ORAL
  Filled 2014-08-14 (×3): qty 1

## 2014-08-14 MED ORDER — FOLIC ACID 1 MG PO TABS
1.0000 mg | ORAL_TABLET | Freq: Every day | ORAL | Status: DC
  Administered 2014-08-14 – 2014-08-16 (×3): 1 mg via ORAL
  Filled 2014-08-14 (×3): qty 1

## 2014-08-14 MED ORDER — THIAMINE HCL 100 MG/ML IJ SOLN: 100.0000 mg | Freq: Every day | INTRAMUSCULAR | Status: DC

## 2014-08-14 MED ORDER — VITAMIN B-1 100 MG PO TABS
100.0000 mg | ORAL_TABLET | Freq: Every day | ORAL | Status: DC
  Administered 2014-08-14 – 2014-08-16 (×3): 100 mg via ORAL
  Filled 2014-08-14 (×3): qty 1

## 2014-08-14 NOTE — Progress Notes (Signed)
UR Completed Teriann Livingood Graves-Bigelow, RN,BSN [phone]  

## 2014-08-14 NOTE — Progress Notes (Signed)
  Echocardiogram 2D Echocardiogram has been performed.  Lysle Rubens 08/14/2014, 4:55 PM

## 2014-08-14 NOTE — Progress Notes (Signed)
Subjective:  Patient presently, denies any chest pain or shortness of breath had episodes of restlessness and agitation last night pulled out his IVs and refused for telemetry monitor did not receive IV hydration his creatinine has increased to 2.06. Cardiac enzymes are trending down   Objective:  Vital Signs in the last 24 hours: Temp:  [95.2 F (35.1 C)-96 F (35.6 C)] 96 F (35.6 C) (01/28 0648) Pulse Rate:  [67-112] 82 (01/28 0648) Resp:  [19-39] 22 (01/28 0648) BP: (105-141)/(63-95) 119/81 mmHg (01/28 0648) SpO2:  [93 %-99 %] 93 % (01/28 0648)  Intake/Output from previous day: 01/27 0701 - 01/28 0700 In: 705 [P.O.:480; I.V.:225] Out: -  Intake/Output from this shift:    Physical Exam: Neck: no adenopathy, no carotid bruit, no JVD and supple, symmetrical, trachea midline Lungs: clear to auscultation bilaterally Heart: regular rate and rhythm, S1, S2 normal and Soft systolic murmur noted Abdomen: soft, non-tender; bowel sounds normal; no masses,  no organomegaly Extremities: extremities normal, atraumatic, no cyanosis or edema  Lab Results:  Recent Labs  08/13/14 0506 08/14/14 0700  WBC 5.6 7.2  HGB 15.1 16.3  PLT 215 235    Recent Labs  08/13/14 2157 08/14/14 0700  NA 137 139  K 4.6 4.9  CL 108 106  CO2 20 21  GLUCOSE 151* 101*  BUN 27* 31*  CREATININE 1.94* 2.06*    Recent Labs  08/13/14 0622 08/13/14 1524  TROPONINI 1.64* 0.64*   Hepatic Function Panel No results for input(s): PROT, ALBUMIN, AST, ALT, ALKPHOS, BILITOT, BILIDIR, IBILI in the last 72 hours. No results for input(s): CHOL in the last 72 hours. No results for input(s): PROTIME in the last 72 hours.  Imaging: Imaging results have been reviewed and Dg Chest 2 View  08/13/2014   CLINICAL DATA:  Acute onset of shortness of breath and chest tightness. Initial encounter.  EXAM: CHEST  2 VIEW  COMPARISON:  None.  FINDINGS: The lungs are well-aerated. Peribronchial thickening is noted.  Minimal bibasilar atelectasis is seen. There is no evidence of pleural effusion or pneumothorax.  The heart is mildly enlarged. No acute osseous abnormalities are seen.  IMPRESSION: Peribronchial thickening noted. Minimal bibasilar atelectasis seen. Mild cardiomegaly.   Electronically Signed   By: Garald Balding M.D.   On: 08/13/2014 03:09    Cardiac Studies:  Assessment/Plan:  Acute coronary syndrome in the setting of cocaine abuse Hypertension Tobacco abuse Cocaine abuse EtOH abuse Probably chronic kidney disease stage IV New-onset diabetes mellitus Plan Continue IV hydration Monitor renal function Check hemoglobin A1c Discussed with patient regarding rescheduling cardiac catheterization possible PTCA stenting for tomorrow tomorrow and agrees.  LOS: 1 day    Billijo Dilling N 08/14/2014, 11:30 AM

## 2014-08-14 NOTE — Progress Notes (Signed)
ANTICOAGULATION CONSULT NOTE - Follow Up Consult  Pharmacy Consult for heparin Indication: chest pain/ACS  No Known Allergies  Patient Measurements: Height: 5\' 8"  (172.7 cm) Weight: 174 lb 4.8 oz (79.062 kg) IBW/kg (Calculated) : 68.4  Vital Signs: Temp: 96 F (35.6 C) (01/28 0648) Temp Source: Oral (01/28 0648) BP: 119/81 mmHg (01/28 0648) Pulse Rate: 82 (01/28 0648)  Labs:  Recent Labs  08/13/14 0506 08/13/14 0622 08/13/14 0702 08/13/14 1400 08/13/14 1500 08/13/14 1524 08/13/14 2157 08/14/14 0700  HGB 15.1  --   --   --   --   --   --  16.3  HCT 44.7  --   --   --   --   --   --  48.6  PLT 215  --   --   --   --   --   --  235  APTT  --   --  26  --   --   --   --   --   LABPROT  --   --  14.9  --   --   --   --   --   INR  --   --  1.16  --   --   --   --   --   HEPARINUNFRC  --   --   --   --  <0.10*  --   --   --   CREATININE 1.93*  --   --  1.92*  --   --  1.94* 2.06*  TROPONINI 1.74* 1.64*  --   --   --  0.64*  --   --     Estimated Creatinine Clearance: 36.9 mL/min (by C-G formula based on Cr of 2.06).   Medications:  Scheduled:  . aspirin EC  81 mg Oral Daily  . atorvastatin  80 mg Oral q1800  . clopidogrel  75 mg Oral Q breakfast  . haloperidol lactate      . heparin  2,000 Units Intravenous Once  . LORazepam  1 mg Intravenous Once  . metoprolol tartrate  50 mg Oral BID  . pantoprazole  40 mg Oral Q0600  . sodium chloride  3 mL Intravenous Q12H   Infusions:  . sodium chloride Stopped (08/14/14 0000)  . sodium chloride 200 mL/hr at 08/14/14 0825  . heparin Stopped (08/14/14 0000)  . nitroGLYCERIN Stopped (08/14/14 0000)    Assessment: 59 yo male with CP and positive troponins on heparin.  He pulled out his IVs last night.  IV line has been replaced and heparin will resume.Plan for cath 1/29.   Goal of Therapy:  Heparin level 0.3-0.7 units/ml Monitor platelets by anticoagulation protocol: Yes   Plan:  -Heparin bolus 2000 units and then  1150 units/hr -Heparin level in 6 hours and daily wth CBC daily  Thanks for allowing pharmacy to be a part of this patient's care.  Excell Seltzer, PharmD Clinical Pharmacist, [phone]  08/14/2014 10:16 AM

## 2014-08-14 NOTE — Progress Notes (Signed)
ANTICOAGULATION CONSULT NOTE - Follow Up Consult  Pharmacy Consult for heparin Indication: chest pain/ACS  No Known Allergies  Patient Measurements: Height: 5\' 8"  (172.7 cm) Weight: 174 lb 4.8 oz (79.062 kg) IBW/kg (Calculated) : 68.4  Vital Signs: Temp: 98.4 F (36.9 C) (01/28 1149) Temp Source: Axillary (01/28 1149) BP: 111/77 mmHg (01/28 1448) Pulse Rate: 64 (01/28 1448)  Labs:  Recent Labs  08/13/14 0506 08/13/14 0622 08/13/14 0702 08/13/14 1400 08/13/14 1500 08/13/14 1524 08/13/14 2157 08/14/14 0700 08/14/14 1500  HGB 15.1  --   --   --   --   --   --  16.3  --   HCT 44.7  --   --   --   --   --   --  48.6  --   PLT 215  --   --   --   --   --   --  235  --   APTT  --   --  26  --   --   --   --   --   --   LABPROT  --   --  14.9  --   --   --   --   --   --   INR  --   --  1.16  --   --   --   --   --   --   HEPARINUNFRC  --   --   --   --  <0.10*  --   --   --  0.51  CREATININE 1.93*  --   --  1.92*  --   --  1.94* 2.06*  --   TROPONINI 1.74* 1.64*  --   --   --  0.64*  --   --   --     Estimated Creatinine Clearance: 36.9 mL/min (by C-G formula based on Cr of 2.06).   Medications:  Scheduled:  . aspirin EC  81 mg Oral Daily  . atorvastatin  80 mg Oral q1800  . clopidogrel  75 mg Oral Q breakfast  . folic acid  1 mg Oral Daily  . LORazepam  1 mg Intravenous Once  . metoprolol tartrate  50 mg Oral BID  . multivitamin with minerals  1 tablet Oral Daily  . pantoprazole  40 mg Oral Q0600  . sodium chloride  3 mL Intravenous Q12H  . thiamine  100 mg Oral Daily   Or  . thiamine  100 mg Intravenous Daily   Infusions:  . sodium chloride Stopped (08/14/14 0000)  . sodium chloride 150 mL/hr at 08/14/14 1115  . heparin 1,150 Units/hr (08/14/14 1030)  . nitroGLYCERIN 5 mcg/min (08/14/14 1030)    Assessment: 59 yo male with CP and positive troponins in the setting of cocaine abuse on IV heparin.  He pulled out his IVs last night.  IV line has been  replaced and heparin resumed. Heparin level 0.51 in goal.  Goal of Therapy:  Heparin level 0.3-0.7 units/ml Monitor platelets by anticoagulation protocol: Yes   Plan:  Continue IV heparin at 1150 units/hr  cardiac catheterization possible PTCA stenting for tomorrow   Dathan Attia S. Alford Highland, PharmD, [facility] Clinical Staff Pharmacist Pager [phone]   08/14/2014 4:44 PM

## 2014-08-14 NOTE — Progress Notes (Signed)
At beginning of shift, pt exhibiting impulsive behavior. Pt getting out of bed and ambulating in room without regard to IV and obstacles. Pt became increasingly restless and anxious; 1 mg PRN ativan given and Dr. Terrence Dupont paged. Orders to give bid dose of 0.25 mg xanax. Pt continued to become increasingly agitated and impulsive, requiring a Air cabin crew. Pt began to pull at telemetry leads. Pt refused telemetry. Pt began to become combative when RN and NT were attempting to obtain a temperature. Pt refused rectal temp. Axillary temp 95.2, MD aware. MD paged about pt's increasing combative and agitated behavior. Orders received for 2 mg morphine. Rapid response nurse paged to assess pt. Before RN could administer morphine, pt removed IV. Heparin, NTG, and normal saline off. Pt refused further IV insertion attempts. Pt refused lab draws. MD aware. Rapid response nurse paged MD; orders for IM Haldol prn and IV ativan when new IV obtained. Pt continued to display agitated and combative behavior. IM Haldol given. Pt refused IV insertion so IV ativan was not given. Pt refused telemetry. Pt refused vital signs. Pt refused assistance when ambulating. Following haldol administration, pt became more calm and was able to go to sleep. Pt is becoming increasingly confused with hallucinations at times. Pt refuses to stay in bed; staff continues to educate pt about safety and fall prevention. Pt allows staff to walk next to him as he ambulates. Currently, pt is in bed asleep. Will continue to closely monitor pt.

## 2014-08-14 NOTE — Care Management Note (Signed)
    Page 1 of 1   08/17/2014     4:57:02 PM CARE MANAGEMENT NOTE 08/17/2014  Patient:  [name], [name]   Account Number:  [id number]  Date Initiated:  08/14/2014  Documentation initiated by:  GRAVES-BIGELOW,BRENDA  Subjective/Objective Assessment:   Pt admitted for cp. Increased troponin. Pt with increased Cr. adn plan for IV hydration. Per MD notes possible cath Friday 08-14-14.     Action/Plan:   CM will monitor for disposition needs. CM will provide pt information to the [facility] for PCP needs.   Anticipated DC Date:  08/16/2014   Anticipated DC Plan:  [place]  CM consult      Choice offered to / List presented to:             Status of service:  Completed, signed off Medicare Important Message given?  NO (If response is "NO", the following Medicare IM given date fields will be blank) Date Medicare IM given:   Medicare IM given by:   Date Additional Medicare IM given:   Additional Medicare IM given by:    Discharge Disposition:  HOME/SELF CARE  Per UR Regulation:  Reviewed for med. necessity/level of care/duration of stay  If discussed at [place] of Stay Meetings, dates discussed:    Comments:  08/16/14 13:00 Cm met with pt in room to discuss outpt PT for [facility]; pt refuses.  CM gave pt [place] pamphlet and pt verbalizes understanding he will go to the clinic any weekday of this week from 9-10am and ask for AN APPOINTMENT FOR A PRIMARY CARE PHYSICIAN; AN APPOINTMENT WITH A NAVIGATOR TO SECURE INSURANCE; AN APPOINTMENT FOR FOLLOW UP CARE. CM also gave pt [facility] letter with a list of participating pharmacies and pt verbalized all parameters of MATCH program. No other CM needs were communicated.  Tempie Hoist, BSN, [place].

## 2014-08-14 NOTE — Progress Notes (Signed)
Called to bedside per Charge RN for patient with increased restlessness, at this time patient is in process of withdrawal following polysubstance abuse. Floor RN has been in touch with Dr.Harwani tonight for patients increasingly erratic behavior. Pt at this time is sitting on side of bed, refusing hospital gown, refusing tele monitor, refusing VS check. Pt has been violent to staff prior to my arrival, walking about room with unsteady gait, sitter in room at this time. Upon my arrival to assess patient, patient jumps out of bed and walks quickly toward door without regard to safety and IV., IV removed by patient. Pt uncooperative, unwilling to lie in bed and refusing new IV. Dr.Harwani paged by myself. Updated on pt status and loss of IV access, Heparin and NTG gtt off.  Per MD patient is for heart cath in the am and is reluctant to sedate patient tonight. Haldol IM prn ordered and IV ativan once IV is reinserted.  Haldol given IM and long discussion had with patient concerning his safety, walking around the room and importance of wearing tele monitor. Pt refusing monitor and at this time is laying in bed. RN to continue to monitor pt closely, will attempt new IV if possible later tonight.

## 2014-08-14 NOTE — Clinical Documentation Improvement (Signed)
Supporting Information: Patient with increasing restlessness and anxiousness, became combative and agitated, required safety sitter to prevent injury to patient per 01/28 Nurse's progress note. Became confused with hallucinations per 01/28 progress notes.  Ativan, Xanax, and Haldol were administered for patient safety per 01/28 progress notes.    Possible Clinical Condition: . Document the etiology of the altered mental status as: --Delirium (including drug-induced) --Drowsiness/somnolence --Stupor/semi-coma --Encephalopathy Alcoholic Anoxic/hypoxic Drug-induced/toxic (specify drug) Hepatic Hypertensive Hypoglycemic Metabolic/septic Traumatic/post-concussion Wernicke Other (specify) . Document any associated diagnoses/conditions    Thank Sherian Maroon Documentation Specialist [phone] Kahleel [email or web address]

## 2014-08-15 ENCOUNTER — Encounter (HOSPITAL_COMMUNITY)
Admission: EM | Disposition: A | Discharge status: Home / Self Care | Payer: Self-pay | Source: Home / Self Care | Attending: Cardiology

## 2014-08-15 ENCOUNTER — Other Ambulatory Visit: Payer: Self-pay

## 2014-08-15 ENCOUNTER — Encounter (HOSPITAL_COMMUNITY): Payer: Self-pay | Admitting: Cardiology

## 2014-08-15 HISTORY — PX: LEFT HEART CATHETERIZATION WITH CORONARY ANGIOGRAM: SHX5451

## 2014-08-15 LAB — BASIC METABOLIC PANEL
Anion gap: 9 (ref 5–15)
BUN: 45 mg/dL — AB (ref 6–23)
CHLORIDE: 110 mmol/L (ref 96–112)
CO2: 20 mmol/L (ref 19–32)
Calcium: 9.1 mg/dL (ref 8.4–10.5)
Creatinine, Ser: 2.24 mg/dL — ABNORMAL HIGH (ref 0.50–1.35)
GFR calc Af Amer: 35 mL/min — ABNORMAL LOW (ref >90)
GFR, EST NON AFRICAN AMERICAN: 30 mL/min — AB (ref >90)
Glucose, Bld: 102 mg/dL — ABNORMAL HIGH (ref 70–99)
POTASSIUM: 5 mmol/L (ref 3.5–5.1)
Sodium: 139 mmol/L (ref 135–145)

## 2014-08-15 LAB — CBC
HCT: 44.3 % (ref 39.0–52.0)
HEMOGLOBIN: 14.9 g/dL (ref 13.0–17.0)
MCH: 33.7 pg (ref 26.0–34.0)
MCHC: 33.6 g/dL (ref 30.0–36.0)
MCV: 100.2 fL — ABNORMAL HIGH (ref 78.0–100.0)
PLATELETS: 212 10*3/uL (ref 150–400)
RBC: 4.42 MIL/uL (ref 4.22–5.81)
RDW: 12.6 % (ref 11.5–15.5)
WBC: 5.2 10*3/uL (ref 4.0–10.5)

## 2014-08-15 LAB — POCT ACTIVATED CLOTTING TIME: Activated Clotting Time: 122 seconds

## 2014-08-15 LAB — HEMOGLOBIN A1C
Hgb A1c MFr Bld: 5.4 % (ref 4.8–5.6)
Mean Plasma Glucose: 108 mg/dL

## 2014-08-15 SURGERY — LEFT HEART CATHETERIZATION WITH CORONARY ANGIOGRAM

## 2014-08-15 MED ORDER — SODIUM BICARBONATE 8.4 % IV SOLN
INTRAVENOUS | Status: DC
  Filled 2014-08-15: qty 1000

## 2014-08-15 MED ORDER — ACETAMINOPHEN 325 MG PO TABS: 650.0000 mg | ORAL_TABLET | ORAL | Status: DC | PRN

## 2014-08-15 MED ORDER — SODIUM CHLORIDE 0.9 % IJ SOLN: 3.0000 mL | INTRAMUSCULAR | Status: DC | PRN

## 2014-08-15 MED ORDER — FUROSEMIDE 10 MG/ML IJ SOLN
INTRAMUSCULAR | Status: AC
  Filled 2014-08-15: qty 4

## 2014-08-15 MED ORDER — SODIUM BICARBONATE BOLUS VIA INFUSION
INTRAVENOUS | Status: AC
  Administered 2014-08-15: 10:00:00 via INTRAVENOUS
  Filled 2014-08-15: qty 1

## 2014-08-15 MED ORDER — SODIUM CHLORIDE 0.9 % IV SOLN: 250.0000 mL | INTRAVENOUS | Status: DC | PRN

## 2014-08-15 MED ORDER — HEPARIN (PORCINE) IN NACL 2-0.9 UNIT/ML-% IJ SOLN
INTRAMUSCULAR | Status: AC
  Filled 2014-08-15: qty 1000

## 2014-08-15 MED ORDER — SODIUM CHLORIDE 0.9 % IV SOLN: 1.0000 mL/kg/h | INTRAVENOUS | Status: DC

## 2014-08-15 MED ORDER — NITROGLYCERIN 1 MG/10 ML FOR IR/CATH LAB
INTRA_ARTERIAL | Status: AC
  Filled 2014-08-15: qty 10

## 2014-08-15 MED ORDER — ONDANSETRON HCL 4 MG/2ML IJ SOLN: 4.0000 mg | Freq: Four times a day (QID) | INTRAMUSCULAR | Status: DC | PRN

## 2014-08-15 MED ORDER — SODIUM CHLORIDE 0.9 % IJ SOLN: 3.0000 mL | Freq: Two times a day (BID) | INTRAMUSCULAR | Status: DC

## 2014-08-15 MED ORDER — ASPIRIN 81 MG PO CHEW
81.0000 mg | CHEWABLE_TABLET | ORAL | Status: AC
  Administered 2014-08-15: 81 mg via ORAL
  Filled 2014-08-15: qty 1

## 2014-08-15 MED ORDER — SODIUM BICARBONATE 8.4 % IV SOLN
INTRAVENOUS | Status: AC
  Filled 2014-08-15: qty 1000

## 2014-08-15 MED ORDER — LIDOCAINE HCL (PF) 1 % IJ SOLN
INTRAMUSCULAR | Status: AC
  Filled 2014-08-15: qty 30

## 2014-08-15 MED ORDER — SODIUM BICARBONATE BOLUS VIA INFUSION
INTRAVENOUS | Status: DC
  Filled 2014-08-15: qty 1

## 2014-08-15 MED ORDER — SODIUM CHLORIDE 0.9 % IV SOLN: INTRAVENOUS | Status: DC

## 2014-08-15 NOTE — Progress Notes (Signed)
Pt brady into 30s-40s twice during the shift. Strips saved; Dr. Terrence Dupont made aware. Pt asymptomatic.

## 2014-08-15 NOTE — H&P (View-Only) (Signed)
Subjective:  Patient presently, denies any chest pain or shortness of breath had episodes of restlessness and agitation last night pulled out his IVs and refused for telemetry monitor did not receive IV hydration his creatinine has increased to 2.06. Cardiac enzymes are trending down   Objective:  Vital Signs in the last 24 hours: Temp:  [95.2 F (35.1 C)-96 F (35.6 C)] 96 F (35.6 C) (01/28 0648) Pulse Rate:  [67-112] 82 (01/28 0648) Resp:  [19-39] 22 (01/28 0648) BP: (105-141)/(63-95) 119/81 mmHg (01/28 0648) SpO2:  [93 %-99 %] 93 % (01/28 0648)  Intake/Output from previous day: 01/27 0701 - 01/28 0700 In: 705 [P.O.:480; I.V.:225] Out: -  Intake/Output from this shift:    Physical Exam: Neck: no adenopathy, no carotid bruit, no JVD and supple, symmetrical, trachea midline Lungs: clear to auscultation bilaterally Heart: regular rate and rhythm, S1, S2 normal and Soft systolic murmur noted Abdomen: soft, non-tender; bowel sounds normal; no masses,  no organomegaly Extremities: extremities normal, atraumatic, no cyanosis or edema  Lab Results:  Recent Labs  08/13/14 0506 08/14/14 0700  WBC 5.6 7.2  HGB 15.1 16.3  PLT 215 235    Recent Labs  08/13/14 2157 08/14/14 0700  NA 137 139  K 4.6 4.9  CL 108 106  CO2 20 21  GLUCOSE 151* 101*  BUN 27* 31*  CREATININE 1.94* 2.06*    Recent Labs  08/13/14 0622 08/13/14 1524  TROPONINI 1.64* 0.64*   Hepatic Function Panel No results for input(s): PROT, ALBUMIN, AST, ALT, ALKPHOS, BILITOT, BILIDIR, IBILI in the last 72 hours. No results for input(s): CHOL in the last 72 hours. No results for input(s): PROTIME in the last 72 hours.  Imaging: Imaging results have been reviewed and Dg Chest 2 View  08/13/2014   CLINICAL DATA:  Acute onset of shortness of breath and chest tightness. Initial encounter.  EXAM: CHEST  2 VIEW  COMPARISON:  None.  FINDINGS: The lungs are well-aerated. Peribronchial thickening is noted.  Minimal bibasilar atelectasis is seen. There is no evidence of pleural effusion or pneumothorax.  The heart is mildly enlarged. No acute osseous abnormalities are seen.  IMPRESSION: Peribronchial thickening noted. Minimal bibasilar atelectasis seen. Mild cardiomegaly.   Electronically Signed   By: Garald Balding M.D.   On: 08/13/2014 03:09    Cardiac Studies:  Assessment/Plan:  Acute coronary syndrome in the setting of cocaine abuse Hypertension Tobacco abuse Cocaine abuse EtOH abuse Probably chronic kidney disease stage IV New-onset diabetes mellitus Plan Continue IV hydration Monitor renal function Check hemoglobin A1c Discussed with patient regarding rescheduling cardiac catheterization possible PTCA stenting for tomorrow tomorrow and agrees.  LOS: 1 day    [name] N 08/14/2014, 11:30 AM

## 2014-08-15 NOTE — CV Procedure (Signed)
Left cardiac cath note dictated on 08/15/2014 dictation number is [id number]

## 2014-08-15 NOTE — Interval H&P Note (Signed)
Cath Lab Visit (complete for each Cath Lab visit)  Clinical Evaluation Leading to the Procedure:   ACS: Yes.    Non-ACS:    Anginal Classification: CCS IV  Anti-ischemic medical therapy: Maximal Therapy (2 or more classes of medications)  Non-Invasive Test Results: Intermediate-risk stress test findings: cardiac mortality 1-3%/year  Prior CABG: No previous CABG      History and Physical Interval Note:  08/15/2014 11:16 AM  [name]  has presented today for surgery, with the diagnosis of cp  The various methods of treatment have been discussed with the patient and family. After consideration of risks, benefits and other options for treatment, the patient has consented to  Procedure(s): LEFT HEART CATHETERIZATION WITH CORONARY ANGIOGRAM (N/A) as a surgical intervention .  The patient's history has been reviewed, patient examined, no change in status, stable for surgery.  I have reviewed the patient's chart and labs.  Questions were answered to the patient's satisfaction.     Clent Demark

## 2014-08-15 NOTE — Progress Notes (Signed)
Subjective:  Patient presently denies any chest pain or shortness of breath no further episodes of confusion. Had 2-D echo yesterday which showed global hypokinesia with EF of 20-25%. Tolerated cardiac cath today with no evidence of coronary artery disease.  Objective:  Vital Signs in the last 24 hours: Temp:  [97.5 F (36.4 C)-98.3 F (36.8 C)] 97.5 F (36.4 C) (01/29 0815) Pulse Rate:  [53-78] 64 (01/29 1057) Resp:  [20-24] 20 (01/29 0943) BP: (89-126)/(70-92) 112/72 mmHg (01/29 0943) SpO2:  [96 %-100 %] 96 % (01/29 0943)  Intake/Output from previous day: 01/28 0701 - 01/29 0700 In: 1398.5 [P.O.:240; I.V.:1158.5] Out: -  Intake/Output from this shift: Total I/O In: -  Out: 200 [Urine:200]  Physical Exam: Neck: no adenopathy, no carotid bruit, no JVD and supple, symmetrical, trachea midline Lungs: Decreased breath sound at bases Heart: regular rate and rhythm, S1, S2 normal and Soft systolic murmur noted Abdomen: soft, non-tender; bowel sounds normal; no masses,  no organomegaly Extremities: extremities normal, atraumatic, no cyanosis or edema  Lab Results:  Recent Labs  08/14/14 0700 08/15/14 0350  WBC 7.2 5.2  HGB 16.3 14.9  PLT 235 212    Recent Labs  08/14/14 1856 08/15/14 0350  NA 141 139  K 5.4* 5.0  CL 110 110  CO2 18* 20  GLUCOSE 119* 102*  BUN 39* 45*  CREATININE 2.27* 2.24*    Recent Labs  08/13/14 1524 08/14/14 1856  TROPONINI 0.64* 0.59*   Hepatic Function Panel No results for input(s): PROT, ALBUMIN, AST, ALT, ALKPHOS, BILITOT, BILIDIR, IBILI in the last 72 hours. No results for input(s): CHOL in the last 72 hours. No results for input(s): PROTIME in the last 72 hours.  Imaging: Imaging results have been reviewed and No results found.  Cardiac Studies:  Assessment/Plan:  Acute coronary syndrome in the setting of cocaine abuse secondary to vasospasm status post left cath Nonischemic dilated cardiomyopathy Hypertension Tobacco  abuse Cocaine abuse EtOH abuse Probably chronic kidney disease stage IV New-onset diabetes mellitus controlled by diet Status post Alcohol withdrawal/deliriums Plan Continue present management Will increase ACE inhibitor send beta blockers as tolerated as outpatient Dr. Doylene Canard on call for weekend. Hopefully discharge home tomorrow if renal function stable  LOS: 2 days    [name] N 08/15/2014, 12:07 PM

## 2014-08-15 NOTE — Progress Notes (Signed)
20mg  lasix given IV by Verdene Lennert at 1210pm

## 2014-08-15 NOTE — Progress Notes (Signed)
Site area: RFA Site Prior to Removal:  Level Geographical information systems officer For:75min Manual:   yes Patient Status During Pull:  stable Post Pull Site:  Level 0 Post Pull Instructions Given:  yes Post Pull Pulses Present: palpable Dressing Applied:  pressure Bedrest begins @ 1245pm Comments:

## 2014-08-16 LAB — BASIC METABOLIC PANEL
ANION GAP: 7 (ref 5–15)
BUN: 45 mg/dL — ABNORMAL HIGH (ref 6–23)
CALCIUM: 8.7 mg/dL (ref 8.4–10.5)
CO2: 24 mmol/L (ref 19–32)
Chloride: 109 mmol/L (ref 96–112)
Creatinine, Ser: 2.23 mg/dL — ABNORMAL HIGH (ref 0.50–1.35)
GFR calc Af Amer: 35 mL/min — ABNORMAL LOW (ref >90)
GFR, EST NON AFRICAN AMERICAN: 30 mL/min — AB (ref >90)
GLUCOSE: 115 mg/dL — AB (ref 70–99)
Potassium: 4.4 mmol/L (ref 3.5–5.1)
SODIUM: 140 mmol/L (ref 135–145)

## 2014-08-16 LAB — CBC
HCT: 40.8 % (ref 39.0–52.0)
Hemoglobin: 13.6 g/dL (ref 13.0–17.0)
MCH: 33.8 pg (ref 26.0–34.0)
MCHC: 33.3 g/dL (ref 30.0–36.0)
MCV: 101.5 fL — AB (ref 78.0–100.0)
PLATELETS: 209 10*3/uL (ref 150–400)
RBC: 4.02 MIL/uL — AB (ref 4.22–5.81)
RDW: 12.9 % (ref 11.5–15.5)
WBC: 5.6 10*3/uL (ref 4.0–10.5)

## 2014-08-16 LAB — LIPID PANEL
Cholesterol: 121 mg/dL (ref 0–200)
HDL: 36 mg/dL — ABNORMAL LOW (ref >39)
LDL CALC: 63 mg/dL (ref 0–99)
Total CHOL/HDL Ratio: 3.4 RATIO
Triglycerides: 109 mg/dL (ref <150)
VLDL: 22 mg/dL (ref 0–40)

## 2014-08-16 LAB — HEMOGLOBIN A1C
Hgb A1c MFr Bld: 5.4 % (ref 4.8–5.6)
Mean Plasma Glucose: 108 mg/dL

## 2014-08-16 MED ORDER — ATORVASTATIN CALCIUM 10 MG PO TABS: 80.0000 mg | ORAL_TABLET | Freq: Every day | ORAL | Status: DC

## 2014-08-16 MED ORDER — RAMIPRIL 1.25 MG PO CAPS: 1.2500 mg | ORAL_CAPSULE | Freq: Every day | ORAL | Status: DC

## 2014-08-16 MED ORDER — CARVEDILOL 3.125 MG PO TABS: 3.1250 mg | ORAL_TABLET | Freq: Two times a day (BID) | ORAL | Status: DC

## 2014-08-16 MED ORDER — NITROGLYCERIN 0.4 MG SL SUBL: 0.4000 mg | SUBLINGUAL_TABLET | SUBLINGUAL | Status: DC | PRN

## 2014-08-16 MED ORDER — ADULT MULTIVITAMIN W/MINERALS CH: 1.0000 | ORAL_TABLET | Freq: Every day | ORAL | Status: DC

## 2014-08-16 NOTE — Discharge Summary (Signed)
Physician Discharge Summary  Patient ID: [name] MRN: [id number] DOB/AGE: 1956/02/01 59 y.o.  Admit date: 08/13/2014 Discharge date: 08/16/2014  Admission Diagnoses: Acute coronary syndrome (in the setting of cocaine abuse secondary to vasospasm)  Status post left cath Nonischemic dilated cardiomyopathy Hypertension Tobacco abuse Cocaine abuse EtOH abuse Probably chronic kidney disease stage IV New-onset diabetes mellitus controlled by diet Status post Alcohol withdrawal/deliriums  Discharge Diagnoses:  Principle Problem: *Acute coronary syndrome (in the setting of cocaine abuse secondary to vasospasm)* Status post left heart cath Nonischemic dilated cardiomyopathy Hypertension Tobacco abuse Cocaine abuse EtOH abuse Probably chronic kidney disease stage III New-onset diabetes mellitus controlled by diet Status post Alcohol withdrawal/deliriums  Discharged Condition: fair  Hospital Course: Patient is 59 year old male with past mental history significant for hypertension history of tobacco abuse, EtOH abuse, cocaine abuse came to the ER complaining of retrosternal chest pain described as numbness day before admission, which lasted approximately 40 minutes associated with mild shortness of breath and anxiety feeling. EKG done in the ED showed sinus tachycardia with right bundle branch block with questionable anterior infarct age undetermined and secondary ST-T wave changes. Patient was noted to have mildly elevated troponin I. Patient states he has been smoking cocaine off and on for a few years last cocaine use was yesterday. States he used to take blood pressure medicine which he has stopped as he cannot afford to buy and is homeless.  His echocardiogram showed 20-25 % LV EF and normal coronaries. His renal function did not deteriorate post procedure. He was discharged home with emphasis on life style modification and discontinuing cocaine and taking medications regularly. He  will be followed by Dr. Terrence Dupont in 1 month.  Consults: cardiology  Significant Diagnostic Studies: labs: CBC was normal. BUN/Cr- 25/1.93 to 45/2.23 Glucose 108 mg/dL. Troponin-I 1.74  EKG: NSR, RBBB and antero-lateral ischemia.  Echocardiogram:  - Left ventricle: The cavity size was moderately dilated. Systolic function was severely reduced. The estimated ejection fraction was in the range of 20% to 25%. Diffuse hypokinesis. - Mitral valve: There was moderate regurgitation. - Left atrium: The atrium was mildly dilated. - Atrial septum: No defect or patent foramen ovale was identified. - Pulmonary arteries: Systolic pressure was moderately increased.PA peak pressure: 47 mm Hg (S).  Cardiac cath: Normal coronaries.  Treatments: cardiac meds: ramipril (Altace), carvedilol and atorvastatin.  Discharge Exam: Blood pressure 120/77, pulse 87, temperature 98.2 F (36.8 C), temperature source Oral, resp. rate 20, height 5\' 8"  (1.727 m), weight 79.062 kg (174 lb 4.8 oz), SpO2 100 %. Physical Exam  Constitutional: He is oriented to person, place, and time.  HENT: Normocephalic and atraumatic. Brown eyes, Conjunctivae are normal. Pupils are equal, round, and reactive to light.  Neck: Normal range of motion. No JVD present. No tracheal deviation present. No thyromegaly present.  Cardiovascular: Normal rate and regular rhythm.II/VI systolic murmur heard. Respiratory: Effort normal and breath sounds normal. No respiratory distress. He has no wheezes. He has no rales.  GI: Soft. Bowel sounds are normal. He exhibits no distension. There is no tenderness.  Musculoskeletal: He exhibits no edema or tenderness.  Neurological: He is alert and oriented to person, place, and time.   Disposition: 01-Home or Self Care     Medication List    STOP taking these medications        naproxen 375 MG tablet  Commonly known as:  NAPROSYN      TAKE these medications        aspirin 325  MG tablet   Take 325 mg by mouth every 4 (four) hours as needed (for pain.).     atorvastatin 10 MG tablet  Commonly known as:  LIPITOR  Take 8 tablets (80 mg total) by mouth daily at 6 PM.     carvedilol 3.125 MG tablet  Commonly known as:  COREG  Take 1 tablet (3.125 mg total) by mouth 2 (two) times daily with a meal.     multivitamin with minerals Tabs tablet  Take 1 tablet by mouth daily.     nitroGLYCERIN 0.4 MG SL tablet  Commonly known as:  NITROSTAT  Place 1 tablet (0.4 mg total) under the tongue every 5 (five) minutes x 3 doses as needed for chest pain.     ramipril 1.25 MG capsule  Commonly known as:  ALTACE  Take 1 capsule (1.25 mg total) by mouth daily.           Follow-up Information    Follow up with Clent Demark, MD. Schedule an appointment as soon as possible for a visit in 1 month.   Specialty:  Cardiology   Contact information:   [facility] [address] [place] [address] [phone]       Signed: Birdie Riddle 08/16/2014, 10:03 AM

## 2014-08-16 NOTE — Discharge Instructions (Signed)
Acute Coronary Syndrome  Acute coronary syndrome (ACS) is an urgent problem in which the blood and oxygen supply to the heart is critically deficient. ACS requires hospitalization because one or more coronary arteries may be blocked.  ACS represents a range of conditions including:  · Previous angina that is now unstable, lasts longer, happens at rest, or is more intense.  · A heart attack, with heart muscle cell injury and death.  There are three vital coronary arteries that supply the heart muscle with blood and oxygen so that it can pump blood effectively. If blockages to these arteries develop, blood flow to the heart muscle is reduced. If the heart does not get enough blood, angina may occur as the first warning sign.  SYMPTOMS   · The most common signs of angina include:  ¨ Tightness or squeezing in the chest.  ¨ Feeling of heaviness on the chest.  ¨ Discomfort in the arms, neck, back, or jaw.  ¨ Shortness of breath and nausea.  ¨ Cold, wet skin.  · Angina is usually brought on by physical effort or excitement which increase the oxygen needs of the heart. These states increase the blood flow needs of the heart beyond what can be delivered.  · Other symptoms that are not as common include:  ¨ Fatigue  ¨ Unexplained feelings of nervousness or anxiety  ¨ Weakness  ¨ Diarrhea  · Sometimes, you may not have noticed any symptoms at all but still suffered a cardiac injury.  TREATMENT   · Medicines to help discomfort may include nitroglycerin (nitro) in the form of tablets or a spray for rapid relief, or longer-acting forms such as cream, patches, or capsules. (Be aware that there are many side effects and possible interactions with other drugs).  · Other medicines may be used to help the heart pump better.  · Procedures to open blocked arteries including angioplasty or stent placement to keep the arteries open.  · Open heart surgery may be needed when there are many blockages or they are in critical locations that  are best treated with surgery.  HOME CARE INSTRUCTIONS   · Do not use any tobacco products including cigarettes, chewing tobacco, or electronic cigarettes.  · Take one baby or adult aspirin daily, if your health care provider advises. This helps reduce the risk of a heart attack.  · It is very important that you follow the angina treatment prescribed by your health care provider. Make arrangements for proper follow-up care.  · Eat a heart healthy diet with salt and fat restrictions as advised.  · Regular exercise is good for you as long as it does not cause discomfort. Do not begin any new type of exercise until you check with your health care provider.  · If you are overweight, you should lose weight.  · Try to maintain normal blood lipid levels.  · Keep your blood pressure under control as recommended by your health care provider.  · You should tell your health care provider right away about any increase in the severity or frequency of your chest discomfort or angina attacks. When you have angina, you should stop what you are doing and sit down. This may bring relief in 3 to 5 minutes. If your health care provider has prescribed nitro, take it as directed.  · If your health care provider has given you a follow-up appointment, it is very important to keep that appointment. Not keeping the appointment could result in a chronic or   of breath.  You feel faint, lightheaded, or pass out.  Your chest discomfort gets worse.  You are sweating or experience sudden profound fatigue.  You do not get relief of your chest pain after 3 doses of nitro.  Your discomfort lasts longer than 15 minutes. MAKE SURE YOU:   Understand these instructions.  Will watch your condition.  Will get help right  away if you are not doing well or get worse.  Take all medicines as directed by your health care provider. Document Released: 07/04/2005 Document Revised: 07/09/2013 Document Reviewed: 11/05/2013 [facility] Patient Information 2015 [place], [place]. This information is not intended to replace advice given to you by your health care provider. Make sure you discuss any questions you have with your health care provider.  Stimulant Use Disorder-Cocaine Cocaine is one of a group of powerful drugs called stimulants. Cocaine has medical uses for stopping nosebleeds and for pain control before minor nose or dental surgery. However, cocaine is misused because of the effects that it produces. These effects include:   A feeling of extreme pleasure.  Alertness.  High energy. Common street names for cocaine include coke, crack, blow, snow, and nose candy. Cocaine is snorted, dissolved in water and injected, or smoked.  Stimulants are addictive because they activate regions of the brain that produce both the pleasurable sensation of "reward" and psychological dependence. Together, these actions account for loss of control and the rapid development of drug dependence. This means you become ill without the drug (withdrawal) and need to keep using it to function.  Stimulant use disorder is use of stimulants that disrupts your daily life. It disrupts relationships with family and friends and how you do your job. Cocaine increases your blood pressure and heart rate. It can cause a heart attack or stroke. Cocaine can also cause death from irregular heart rate or seizures. SYMPTOMS Symptoms of stimulant use disorder with cocaine include:  Use of cocaine in larger amounts or over a longer period of time than intended.  Unsuccessful attempts to cut down or control cocaine use.  A lot of time spent obtaining, using, or recovering from the effects of cocaine.  A strong desire or urge to use cocaine  (craving).  Continued use of cocaine in spite of major problems at work, school, or home because of use.  Continued use of cocaine in spite of relationship problems because of use.  Giving up or cutting down on important life activities because of cocaine use.  Use of cocaine over and over in situations when it is physically hazardous, such as driving a car.  Continued use of cocaine in spite of a physical problem that is likely related to use. Physical problems can include:  Malnutrition.  Nosebleeds.  Chest pain.  High blood pressure.  A hole that develops between the part of your nose that separates your nostrils (perforated nasal septum).  Lung and kidney damage.  Continued use of cocaine in spite of a mental problem that is likely related to use. Mental problems can include:  Schizophrenia-like symptoms.  Depression.  Bipolar mood swings.  Anxiety.  Sleep problems.  Need to use more and more cocaine to get the same effect, or lessened effect over time with use of the same amount of cocaine (tolerance).  Having withdrawal symptoms when cocaine use is stopped, or using cocaine to reduce or avoid withdrawal symptoms. Withdrawal symptoms include:  Depressed or irritable mood.  Low energy or restlessness.  Bad dreams.  Poor or excessive  sleep.  Increased appetite. DIAGNOSIS Stimulant use disorder is diagnosed by your health care provider. You may be asked questions about your cocaine use and how it affects your life. A physical exam may be done. A drug screen may be ordered. You may be referred to a mental health professional. The diagnosis of stimulant use disorder requires at least two symptoms within 12 months. The type of stimulant use disorder depends on the number of signs and symptoms you have. The type may be:  Mild. Two or three signs and symptoms.  Moderate. Four or five signs and symptoms.  Severe. Six or more signs and  symptoms. TREATMENT Treatment for stimulant use disorder is usually provided by mental health professionals with training in substance use disorders. The following options are available:  Counseling or talk therapy. Talk therapy addresses the reasons you use cocaine and ways to keep you from using again. Goals of talk therapy include:  Identifying and avoiding triggers for use.  Handling cravings.  Replacing use with healthy activities.  Support groups. Support groups provide emotional support, advice, and guidance.  Medicine. Certain medicines may decrease cocaine cravings or withdrawal symptoms. HOME CARE INSTRUCTIONS  Take medicines only as directed by your health care provider.  Identify the people and activities that trigger your cocaine use and avoid them.  Keep all follow-up visits as directed by your health care provider. SEEK MEDICAL CARE IF:  Your symptoms get worse or you relapse.  You are not able to take medicines as directed. SEEK IMMEDIATE MEDICAL CARE IF:  You have serious thoughts about hurting yourself or others.  You have a seizure, chest pain, sudden weakness, or loss of speech or vision. [place] on Drug Abuse: motorcyclefax.com  Substance Abuse and Mental Health Services Administration: ktimeonline.com Document Released: 07/01/2000 Document Revised: 11/18/2013 Document Reviewed: 07/17/2013 [facility] Patient Information 2015 [place], [place]. This information is not intended to replace advice given to you by your health care provider. Make sure you discuss any questions you have with your health care provider.  Cardiac Diet A cardiac diet can help stop heart disease or a stroke from happening. It involves eating less unhealthy fats and eating more healthy fats.  FOODS TO AVOID OR LIMIT  Limit saturated fats. This type of fat is found in oils and dairy products, such as:  Coconut oil.  Palm oil.  Cocoa  butter.  Butter.  Avoid trans-fat or hydrogenated oils. These are found in fried or pre-made baked goods, such as:  Margarine.  Pre-made cookies, cakes, and crackers.  Limit processed meats (hot dogs, deli meats, sausage) to 3 ounces a week.  Limit high-fat meats (marbled meats, fried chicken, or chicken with skin) to 3 ounces a week.  Limit salt (sodium) to 1500 milligrams a day.   Limit sweets and drinks with added sugar to no more than 5 servings a week. One serving is:  1 tablespoon of sugar.  1 tablespoon of jelly or jam.   cup sorbet.  1 cup lemonade.   cup regular soda. EAT MORE OF THE FOLLOWING FOODS Fruit  Eat 4to 5 servings a day. One serving of fruit is:  1 medium whole fruit.   cup dried fruit.   cup of fresh, frozen, or canned fruit.   cup 100% fruit juice. Vegetables  Eat 4 to 5 servings a day. One serving is:  1 cup raw leafy vegetables.   cup raw or cooked, cut-up vegetables.   cup vegetable juice.  Whole Grains  Eat 3 servings a day (1 ounce equals 1 serving). Legumes (such as beans, peas, and lentils)   Eat at least 4 servings a week ( cup equals 1 serving). Nuts and Seeds   Eat at least 4 servings a week ( cup equals 1 serving). Dietary Fiber  Eat 20 to 30 grams a day. Some foods high in dietary fiber include:  Dried beans.  Citrus fruits.  Apples, bananas.  Broccoli, Brussels sprouts, and eggplant.  Oats. Omega-3 Fats  Eat food with omega-3 fats. You can also take a dietary pill (supplement) that has 1 gram of DHA and EPA. Have 3.5 ounces of fatty fish a week, such as:  Salmon.  Mackerel.  [place]bacore tuna.  Sardines.  Lake trout.  Herring. PREPARING YOUR FOOD  Broil, bake, steam, or roast foods. Do not fry food. Do not cook food in butter (fat).  Use non-stick cooking sprays.  Remove skin from poultry, such as chicken and [place].  Remove fat from meat.  Take the fat off the top of stews, soups,  and gravy.  Use lemon or herbs to flavor food instead of using butter or margarine.  Use nonfat yogurt, salsa, or low-fat dressings for salads. Document Released: 01/03/2012 Document Reviewed: 01/03/2012 [facility] Patient Information 2015 [place]. This information is not intended to replace advice given to you by your health care provider. Make sure you discuss any questions you have with your health care provider.  Groin Site Care:  -Do NOT lift anything heavier than 5lbs for one week.  -Do NOT drive for one week.    -Do NOT take a soaking bath for one week.  You may shower, but dry Right Groin well.  Do not use lotion, oils, or creams on site.   -If you see any swelling, bruising, drainage at your groin, call doctor right away.  If you develop a fever greater than 100.1, call the doctor.

## 2014-08-16 NOTE — Cardiovascular Report (Signed)
NAMETREVIONNE, SANTOR              ACCOUNT NO.:  [id number]  MEDICAL RECORD NO.:  [id number]  LOCATION:  [id number]                        FACILITY:  [facility]  PHYSICIAN:  Allegra Lai. Terrence Dupont, M.D. DATE OF BIRTH:  1956/05/29  DATE OF PROCEDURE:  08/15/2014 DATE OF DISCHARGE:                           CARDIAC CATHETERIZATION   PROCEDURE:  Left cardiac cath with selective left and right coronary angiography, measurement of LVEDP via right groin using Judkins technique.  INDICATION FOR THE PROCEDURE:  Mr. Lonardo is a 59 year old male with past medical history significant for hypertension, history of tobacco abuse, alcohol abuse, cocaine abuse.  He came to the ER complaining of retrosternal chest pain described as numbness yesterday morning which lasted approximately 40 minutes associated with mild shortness of breath, anxiety feeling, did not seek any medical attention.  Yesterday, he came to ER by EMS.  EKG done in the ED showed sinus tach with right bundle-branch block with questionable anterior infarct, age undetermined, and secondary ST-T wave changes.  The patient was noted to have mildly elevated troponin I of 1.9.  The patient states he has been smoking cocaine off and on for few years, last cocaine use was yesterday.  He states he used to take blood pressure medicine which he has stopped as he cannot afford to buy and is homeless, lives mostly in the car and occasionally with friends.  Due to chest pain, elevated cardiac enzymes. and recent cocaine use, discussed with patient regarding left cath, possible PTCA stenting, its risks and benefits, i.e., death, MI, stroke, need for emergency CABG, local vascular complications, worsening renal function  requiring dialysis, etc., and consented for PCI.  DESCRIPTION OF PROCEDURE:  After obtaining the informed consent, patient was brought to the cath lab and was placed on fluoroscopy table.  Right groin was prepped and draped in usual fashion.   1% Xylocaine was used for local anesthesia.  With the help of thin wall needle, 5-French arterial sheath was placed.  The sheath was aspirated and flushed. Next, 5-French left Judkins catheter was advanced over the wire under fluoroscopic guidance up to the ascending aorta.  Wire was pulled out. The catheter was aspirated and connected to the Manifold.  Catheter was further advanced and engaged into left coronary ostium.  Multiple views of the left system were taken.  Next, catheter was disengaged and was pulled out over the wire and was replaced with 5-French right Judkins catheter, which was advanced over the wire under fluoroscopic guidance up to the ascending aorta.  Wire was pulled out.  The catheter was aspirated and connected to the Manifold.  Catheter was further advanced and engaged into right coronary ostium.  A single view of right coronary artery was obtained.  Next, catheter was disengaged and was pulled out over the wire and was replaced with 5-French pigtail catheter, which was advanced over the wire under fluoroscopic guidance up to the ascending aorta.  Wire was pulled out.  The catheter was aspirated and connected to the Manifold.  Catheter was further advanced across the aortic valve into the LV.  LV pressures were recorded.  Next, pullback pressures were recorded from the aorta.  There was no gradient across aortic  valve. Next, pigtail catheter was pulled out over the wire.  Sheaths were aspirated and flushed.  FINDINGS:  LVEDP was elevated to 35 mmHg.  Patient will receive 20 of IV Lasix after the procedure.  Left main was patent.  LAD was patent. Diagonal 1 and 2 were small which were patent.  Diagonal 3 and 4 were very small which were patent.  Left circumflex was patent.  OM1 was very, very small which was patent.  OM2 was large which was patent.  RCA was patent.  PDA and PLV branches were patent. The patient tolerated the procedure well.  There were no  complications. Total amount of contrast used was approximately 18 mL.  The patient tolerated the procedure well and was transferred to recovery room in stable condition.     Allegra Lai. Terrence Dupont, M.D.     MNH/MEDQ  D:  08/15/2014  T:  08/16/2014  Job:  [id number]

## 2014-08-16 NOTE — Progress Notes (Signed)
Provided pt with SA resources, shelter list and bus pass.  Pt will d/c back to his girlfriend's house, until he can secure a better arrangement.  Pt has no income or job at this time and cannot return to [employer] x 6 months. Pt has sought SA counseling in the past at "a house" but cannot remember the name of the program.  Pt may f/u there at d/c.  Pt asking for med assistance.  RN to notify CM.

## 2014-09-03 ENCOUNTER — Emergency Department (HOSPITAL_COMMUNITY): Payer: Medicaid Other

## 2014-09-03 ENCOUNTER — Encounter (HOSPITAL_COMMUNITY): Payer: Self-pay | Admitting: *Deleted

## 2014-09-03 ENCOUNTER — Inpatient Hospital Stay (HOSPITAL_COMMUNITY)
Admission: EM | Admit: 2014-09-03 | Discharge: 2014-09-07 | DRG: 281 | Disposition: A | Discharge status: Home / Self Care | Payer: Medicaid Other | Attending: Cardiology | Admitting: Cardiology

## 2014-09-03 DIAGNOSIS — I5022 Chronic systolic (congestive) heart failure: Secondary | ICD-10-CM | POA: Diagnosis present

## 2014-09-03 DIAGNOSIS — R0600 Dyspnea, unspecified: Secondary | ICD-10-CM | POA: Diagnosis not present

## 2014-09-03 DIAGNOSIS — Z590 Homelessness: Secondary | ICD-10-CM | POA: Diagnosis not present

## 2014-09-03 DIAGNOSIS — E119 Type 2 diabetes mellitus without complications: Secondary | ICD-10-CM | POA: Diagnosis present

## 2014-09-03 DIAGNOSIS — I9589 Other hypotension: Secondary | ICD-10-CM | POA: Diagnosis present

## 2014-09-03 DIAGNOSIS — Z23 Encounter for immunization: Secondary | ICD-10-CM | POA: Diagnosis not present

## 2014-09-03 DIAGNOSIS — Z87891 Personal history of nicotine dependence: Secondary | ICD-10-CM

## 2014-09-03 DIAGNOSIS — N184 Chronic kidney disease, stage 4 (severe): Secondary | ICD-10-CM | POA: Diagnosis present

## 2014-09-03 DIAGNOSIS — I130 Hypertensive heart and chronic kidney disease with heart failure and stage 1 through stage 4 chronic kidney disease, or unspecified chronic kidney disease: Secondary | ICD-10-CM | POA: Diagnosis present

## 2014-09-03 DIAGNOSIS — Z79899 Other long term (current) drug therapy: Secondary | ICD-10-CM

## 2014-09-03 DIAGNOSIS — I5021 Acute systolic (congestive) heart failure: Secondary | ICD-10-CM

## 2014-09-03 DIAGNOSIS — I420 Dilated cardiomyopathy: Secondary | ICD-10-CM | POA: Diagnosis present

## 2014-09-03 DIAGNOSIS — I214 Non-ST elevation (NSTEMI) myocardial infarction: Secondary | ICD-10-CM | POA: Diagnosis not present

## 2014-09-03 LAB — COMPREHENSIVE METABOLIC PANEL
ALT: 43 U/L (ref 0–53)
AST: 38 U/L — ABNORMAL HIGH (ref 0–37)
Albumin: 3.4 g/dL — ABNORMAL LOW (ref 3.5–5.2)
Alkaline Phosphatase: 247 U/L — ABNORMAL HIGH (ref 39–117)
Anion gap: 6 (ref 5–15)
BUN: 25 mg/dL — ABNORMAL HIGH (ref 6–23)
CO2: 27 mmol/L (ref 19–32)
Calcium: 9.3 mg/dL (ref 8.4–10.5)
Chloride: 105 mmol/L (ref 96–112)
Creatinine, Ser: 1.86 mg/dL — ABNORMAL HIGH (ref 0.50–1.35)
GFR calc Af Amer: 44 mL/min — ABNORMAL LOW (ref >90)
GFR calc non Af Amer: 38 mL/min — ABNORMAL LOW (ref >90)
Glucose, Bld: 112 mg/dL — ABNORMAL HIGH (ref 70–99)
Potassium: 4.7 mmol/L (ref 3.5–5.1)
Sodium: 138 mmol/L (ref 135–145)
Total Bilirubin: 1.2 mg/dL (ref 0.3–1.2)
Total Protein: 6.6 g/dL (ref 6.0–8.3)

## 2014-09-03 LAB — CBC WITH DIFFERENTIAL/PLATELET
Basophils Absolute: 0.1 10*3/uL (ref 0.0–0.1)
Basophils Absolute: 0 10*3/uL (ref 0.0–0.1)
Basophils Relative: 1 % (ref 0–1)
Basophils Relative: 1 % (ref 0–1)
Eosinophils Absolute: 0.1 10*3/uL (ref 0.0–0.7)
Eosinophils Absolute: 0 10*3/uL (ref 0.0–0.7)
Eosinophils Relative: 2 % (ref 0–5)
Eosinophils Relative: 0 % (ref 0–5)
HCT: 46 % (ref 39.0–52.0)
HEMATOCRIT: 43.5 % (ref 39.0–52.0)
Hemoglobin: 14.9 g/dL (ref 13.0–17.0)
Hemoglobin: 15.8 g/dL (ref 13.0–17.0)
LYMPHS ABS: 1.8 10*3/uL (ref 0.7–4.0)
LYMPHS PCT: 28 % (ref 12–46)
Lymphocytes Relative: 15 % (ref 12–46)
Lymphs Abs: 1.1 10*3/uL (ref 0.7–4.0)
MCH: 35.5 pg — ABNORMAL HIGH (ref 26.0–34.0)
MCH: 35 pg — ABNORMAL HIGH (ref 26.0–34.0)
MCHC: 34.3 g/dL (ref 30.0–36.0)
MCHC: 34.3 g/dL (ref 30.0–36.0)
MCV: 103.6 fL — AB (ref 78.0–100.0)
MCV: 101.8 fL — ABNORMAL HIGH (ref 78.0–100.0)
MONOS PCT: 7 % (ref 3–12)
Monocytes Absolute: 0.4 10*3/uL (ref 0.1–1.0)
Monocytes Absolute: 0.4 10*3/uL (ref 0.1–1.0)
Monocytes Relative: 6 % (ref 3–12)
Neutro Abs: 4.1 10*3/uL (ref 1.7–7.7)
Neutro Abs: 5.5 10*3/uL (ref 1.7–7.7)
Neutrophils Relative %: 62 % (ref 43–77)
Neutrophils Relative %: 78 % — ABNORMAL HIGH (ref 43–77)
PLATELETS: 215 10*3/uL (ref 150–400)
Platelets: 220 10*3/uL (ref 150–400)
RBC: 4.2 MIL/uL — ABNORMAL LOW (ref 4.22–5.81)
RBC: 4.52 MIL/uL (ref 4.22–5.81)
RDW: 12.3 % (ref 11.5–15.5)
RDW: 12.2 % (ref 11.5–15.5)
WBC: 6.5 10*3/uL (ref 4.0–10.5)
WBC: 7.1 10*3/uL (ref 4.0–10.5)

## 2014-09-03 LAB — I-STAT TROPONIN, ED
Troponin i, poc: 1.81 ng/mL (ref 0.00–0.08)
Troponin i, poc: 1.38 ng/mL (ref 0.00–0.08)

## 2014-09-03 LAB — TROPONIN I: TROPONIN I: 2.48 ng/mL — AB (ref <0.031)

## 2014-09-03 LAB — I-STAT CHEM 8, ED
BUN: 27 mg/dL — AB (ref 6–23)
Calcium, Ion: 1.17 mmol/L (ref 1.13–1.30)
Chloride: 106 mmol/L (ref 96–112)
Creatinine, Ser: 1.9 mg/dL — ABNORMAL HIGH (ref 0.50–1.35)
Glucose, Bld: 125 mg/dL — ABNORMAL HIGH (ref 70–99)
HCT: 49 % (ref 39.0–52.0)
Hemoglobin: 16.7 g/dL (ref 13.0–17.0)
Potassium: 4.3 mmol/L (ref 3.5–5.1)
Sodium: 140 mmol/L (ref 135–145)
TCO2: 21 mmol/L (ref 0–100)

## 2014-09-03 LAB — I-STAT CG4 LACTIC ACID, ED
Lactic Acid, Venous: 2.78 mmol/L (ref 0.5–2.0)
Lactic Acid, Venous: 1.54 mmol/L (ref 0.5–2.0)

## 2014-09-03 LAB — BRAIN NATRIURETIC PEPTIDE
B NATRIURETIC PEPTIDE 5: 3572.8 pg/mL — AB (ref 0.0–100.0)
B Natriuretic Peptide: 3580.5 pg/mL — ABNORMAL HIGH (ref 0.0–100.0)

## 2014-09-03 MED ORDER — ADULT MULTIVITAMIN W/MINERALS CH
1.0000 | ORAL_TABLET | Freq: Every day | ORAL | Status: DC
  Administered 2014-09-04 – 2014-09-07 (×4): 1 via ORAL
  Filled 2014-09-03 (×4): qty 1

## 2014-09-03 MED ORDER — ACETAMINOPHEN 325 MG PO TABS
650.0000 mg | ORAL_TABLET | ORAL | Status: DC | PRN
  Administered 2014-09-05 – 2014-09-07 (×4): 650 mg via ORAL
  Filled 2014-09-03 (×4): qty 2

## 2014-09-03 MED ORDER — RAMIPRIL 1.25 MG PO CAPS
1.2500 mg | ORAL_CAPSULE | Freq: Every day | ORAL | Status: DC
  Administered 2014-09-04 – 2014-09-07 (×4): 1.25 mg via ORAL
  Filled 2014-09-03 (×5): qty 1

## 2014-09-03 MED ORDER — FUROSEMIDE 10 MG/ML IJ SOLN
40.0000 mg | Freq: Every day | INTRAMUSCULAR | Status: DC
  Administered 2014-09-04 – 2014-09-05 (×2): 40 mg via INTRAVENOUS
  Filled 2014-09-03 (×2): qty 4

## 2014-09-03 MED ORDER — ASPIRIN EC 81 MG PO TBEC
81.0000 mg | DELAYED_RELEASE_TABLET | Freq: Every day | ORAL | Status: DC
  Administered 2014-09-04 – 2014-09-07 (×4): 81 mg via ORAL
  Filled 2014-09-03 (×4): qty 1

## 2014-09-03 MED ORDER — CARVEDILOL 3.125 MG PO TABS
3.1250 mg | ORAL_TABLET | Freq: Two times a day (BID) | ORAL | Status: DC
  Administered 2014-09-04 – 2014-09-07 (×7): 3.125 mg via ORAL
  Filled 2014-09-03 (×8): qty 1

## 2014-09-03 MED ORDER — HEPARIN SODIUM (PORCINE) 5000 UNIT/ML IJ SOLN
5000.0000 [IU] | Freq: Three times a day (TID) | INTRAMUSCULAR | Status: DC
  Administered 2014-09-03 – 2014-09-07 (×11): 5000 [IU] via SUBCUTANEOUS
  Filled 2014-09-03 (×13): qty 1

## 2014-09-03 MED ORDER — SODIUM CHLORIDE 0.9 % IV BOLUS (SEPSIS): 1000.0000 mL | Freq: Once | INTRAVENOUS | Status: DC

## 2014-09-03 MED ORDER — ONDANSETRON HCL 4 MG/2ML IJ SOLN: 4.0000 mg | Freq: Four times a day (QID) | INTRAMUSCULAR | Status: DC | PRN

## 2014-09-03 MED ORDER — ASPIRIN 300 MG RE SUPP: 300.0000 mg | RECTAL | Status: AC

## 2014-09-03 MED ORDER — ATORVASTATIN CALCIUM 80 MG PO TABS
80.0000 mg | ORAL_TABLET | Freq: Every day | ORAL | Status: DC
  Administered 2014-09-04 – 2014-09-06 (×3): 80 mg via ORAL
  Filled 2014-09-03 (×3): qty 1

## 2014-09-03 MED ORDER — NITROGLYCERIN 0.4 MG SL SUBL: 0.4000 mg | SUBLINGUAL_TABLET | SUBLINGUAL | Status: DC | PRN

## 2014-09-03 MED ORDER — MILRINONE IN DEXTROSE 20 MG/100ML IV SOLN
0.1250 ug/kg/min | INTRAVENOUS | Status: DC
  Administered 2014-09-03 – 2014-09-06 (×5): 0.25 ug/kg/min via INTRAVENOUS
  Filled 2014-09-03 (×8): qty 100

## 2014-09-03 MED ORDER — SODIUM CHLORIDE 0.9 % IV SOLN
INTRAVENOUS | Status: DC
  Administered 2014-09-03: 23:00:00 via INTRAVENOUS

## 2014-09-03 MED ORDER — ASPIRIN 81 MG PO CHEW
324.0000 mg | CHEWABLE_TABLET | ORAL | Status: AC
  Administered 2014-09-03: 324 mg via ORAL
  Filled 2014-09-03: qty 4

## 2014-09-03 NOTE — ED Notes (Signed)
NOTIFIED DR. NANAVATTI FOR PATIENTS LAB RESULTS OF I-STAT CHEM8+ ,I-STAT TROPONIN ,CG4+LACTIC ACID.

## 2014-09-03 NOTE — ED Provider Notes (Signed)
CSN: [id number]     Arrival date & time 09/03/14  1738 History   First MD Initiated Contact with Patient 09/03/14 1752     Chief Complaint  Patient presents with  . Shortness of Breath     (Consider location/radiation/quality/duration/timing/severity/associated sxs/prior Treatment) HPI   59 yo M with PMHx of dilated, NICM with h/o recent ACS in setting of cocaine abuse, with EF 20-25% with patent coronaries, HTN, NIDDM, h/o tobacco and coaine abuse, EtOH abuse, CKD IV, recently discharged from the hospital who presents with severe SOB. Pt states he has had progressive onset of SOB over the past 2-3 days, which he describes as a sensation of being unable to catch his breath. It initially occurred with exertion and lying flat but now occurs at rest. He states his sx are similar to his prior ACS/CHF, though he denies any current CP. He has not taken anything for this. He has had a mild, nonproductive cough but denies any fevers. No sputum production. No syncope.  Past Medical History  Diagnosis Date  . Hypertension    Past Surgical History  Procedure Laterality Date  . Left heart catheterization with coronary angiogram N/A 08/15/2014    Procedure: LEFT HEART CATHETERIZATION WITH CORONARY ANGIOGRAM;  Surgeon: Clent Demark, MD;  Location: [facility] CATH LAB;  Service: Cardiovascular;  Laterality: N/A;   No family history on file. History  Substance Use Topics  . Smoking status: Former Smoker    Quit date: 02/19/2004  . Smokeless tobacco: Not on file  . Alcohol Use: Yes    Review of Systems  Constitutional: Negative for fever and chills.  HENT: Negative for congestion, rhinorrhea and sore throat.   Respiratory: Positive for cough and shortness of breath.   Cardiovascular: Positive for leg swelling. Negative for chest pain.  Gastrointestinal: Positive for nausea. Negative for vomiting, abdominal pain and diarrhea.  Genitourinary: Negative for dysuria.  Musculoskeletal: Negative for neck  pain.  Skin: Negative for rash.  Allergic/Immunologic: Negative for immunocompromised state.  Neurological: Negative for dizziness, weakness and headaches.      Allergies  Review of patient's allergies indicates no known allergies.  Home Medications   Prior to Admission medications   Medication Sig Start Date End Date Taking? Authorizing Provider  aspirin 325 MG tablet Take 325 mg by mouth every 4 (four) hours as needed (for pain.).    Historical Provider, MD  atorvastatin (LIPITOR) 10 MG tablet Take 8 tablets (80 mg total) by mouth daily at 6 PM. 08/16/14   Birdie Riddle, MD  carvedilol (COREG) 3.125 MG tablet Take 1 tablet (3.125 mg total) by mouth 2 (two) times daily with a meal. 08/16/14   Birdie Riddle, MD  Multiple Vitamin (MULTIVITAMIN WITH MINERALS) TABS tablet Take 1 tablet by mouth daily. 08/16/14   Birdie Riddle, MD  nitroGLYCERIN (NITROSTAT) 0.4 MG SL tablet Place 1 tablet (0.4 mg total) under the tongue every 5 (five) minutes x 3 doses as needed for chest pain. 08/16/14   Birdie Riddle, MD  ramipril (ALTACE) 1.25 MG capsule Take 1 capsule (1.25 mg total) by mouth daily. 08/16/14   Birdie Riddle, MD   BP 101/69 mmHg  Pulse 125  Temp(Src) 98.2 F (36.8 C) (Oral)  Resp 31  Ht 5\' 8"  (1.727 m)  Wt 175 lb (79.379 kg)  BMI 26.61 kg/m2  SpO2 100% Physical Exam  Constitutional: He is oriented to person, place, and time. Vital signs are normal. He has a sickly appearance.  He appears ill. He appears distressed.  HENT:  Head: Normocephalic.  Mouth/Throat: No oropharyngeal exudate.  Eyes: Conjunctivae are normal. Pupils are equal, round, and reactive to light.  Neck: Neck supple.  Cardiovascular: Regular rhythm.  Tachycardia present.  PMI is displaced.  Exam reveals no friction rub.   No murmur heard. Pulmonary/Chest: Tachypnea noted. He is in respiratory distress. He has rhonchi in the right lower field and the left lower field. He has rales in the right lower field and the  left lower field.  Abdominal: Soft. He exhibits no distension. There is no tenderness.  Musculoskeletal: He exhibits edema (1+ pitting).  Neurological: He is alert and oriented to person, place, and time.  Skin: Skin is warm. No rash noted.  Vitals reviewed.   ED Course  Procedures (including critical care time)   EMERGENCY DEPARTMENT [place] FAST EXAM INDICATIONS:Hypotension and Tachycardia PERFORMED BY: Myself IMAGES ARCHIVED?: Yes FINDINGS: All views negative LIMITATIONS:  Emergent procedure INTERPRETATION:  No abdominal free fluid and No pericardial effusion COMMENT:  No free fluid noted. No pericardial effusion. Dilated heart chambers noted with poor EF. No RV dilatation. IVC mildly distended.  Labs Review Labs Reviewed  CBC WITH DIFFERENTIAL/PLATELET - Abnormal; Notable for the following:    RBC 4.20 (*)    MCV 103.6 (*)    MCH 35.5 (*)    All other components within normal limits  I-STAT TROPOININ, ED - Abnormal; Notable for the following:    Troponin i, poc 1.81 (*)    All other components within normal limits  I-STAT CG4 LACTIC ACID, ED - Abnormal; Notable for the following:    Lactic Acid, Venous 2.78 (*)    All other components within normal limits  I-STAT CHEM 8, ED - Abnormal; Notable for the following:    BUN 27 (*)    Creatinine, Ser 1.90 (*)    Glucose, Bld 125 (*)    All other components within normal limits  BRAIN NATRIURETIC PEPTIDE  TROPONIN I  URINE RAPID DRUG SCREEN (HOSP PERFORMED)  I-STAT TROPOININ, ED  I-STAT CHEM 8, ED  I-STAT CG4 LACTIC ACID, ED    Imaging Review Dg Chest Portable 1 View  09/03/2014   CLINICAL DATA:  Shortness of breath for 1 month, worse today.  EXAM: PORTABLE CHEST - 1 VIEW  COMPARISON:  PA and lateral chest 08/13/2014.  FINDINGS: Again seen is cardiomegaly. There is no pulmonary edema. Lungs are clear. No pneumothorax or pleural effusion.  IMPRESSION: Cardiomegaly without acute disease.   Electronically Signed   By: Inge Rise M.D.   On: 09/03/2014 18:48     EKG Interpretation   Date/Time:  Wednesday September 03 2014 17:45:31 EST Ventricular Rate:  122 PR Interval:  144 QRS Duration: 161 QT Interval:  346 QTC Calculation: 493 R Axis:   -92 Text Interpretation:   Sinus tachycardia Right bundle branch block  Anterior infarct, age indeterminate Non-specific abnormality, ST segment,  and/or T-wave new t wave inversions in the anterior leads compared to  before Confirmed by Kathrynn Humble, MD, Thelma Comp [phone]) on 09/03/2014 5:59:51 PM      MDM   Final diagnoses:  Dyspnea  NSTEMI (non-ST elevated myocardial infarction)  Acute systolic congestive heart failure    59 yo M with PMHx of dilated, NICM with h/o recent ACS in setting of cocaine abuse, with EF 20-25% with patent coronaries, HTN, NIDDM, h/o tobacco and coaine abuse, EtOH abuse, CKD IV, recently discharged from the hospital who presents with severe SOB.  See HPI above. On arrival, pt AF, HR 130, RR 33, BP 68/palp, satting 99% on NRB. Exam as above, pt distressed with tachypnea, bibasilar rales, and weak radial and PT pulses (DP diminished BL).  Pt's presentation is most c/f acute CHF exacerbation. BSUS performed by myself negative for free fluid, making acute AAA rupture unlikely. No pericardial effusion or signs of tamponade. No RV dilatation noted and do not suspect subacute/massive PE at this time. EKG shows TWI in anterior leads, likely 2/2 strain but NSTEMI/ACS on DDx. Pt is s/p aspirin. Pt given albuterol by EMS, will hold at this time given no wheezing on exam, concern for possible cardiogenic shock. NS 250 cc given, with BP 100-110s. Fluids d/c'ed, will hold and consider dobutamine, levo if needed. Will send broad labs. No apparent infectious etiology, and suspect this is acute on chronic CHF exacerbation.  HR improved to 90-110, BP 100-110s, satting 97% on 2L [place] at this time. Pt improved with reassurance, gentle fluids. Labs above, with elevated TnI  and BNP, c/w acute CHF exacerbation and likely secondary demand ischemia. CKD at baseline. CXR without overt fluid overload. Lactate 2.78, likely 2/2 poor perfusion in setting of cardiogenic hypotension. D/w Cardiology, who will admit. Will start milrinone, admit to SDU. VSS and improving.  Clinical Impression: 1. NSTEMI (non-ST elevated myocardial infarction)   2. Dyspnea   3. Acute systolic congestive heart failure     Disposition: Admit  Condition: Stable  Pt seen in conjunction with Dr. Hassan Buckler, MD 09/04/14 [id number]  Varney Biles, MD 09/04/14 [id number]

## 2014-09-03 NOTE — ED Notes (Signed)
Cardiology at BS

## 2014-09-03 NOTE — ED Notes (Signed)
FAST exam completed at bedside.

## 2014-09-03 NOTE — ED Notes (Signed)
Flow called to inquire about bed request.  

## 2014-09-03 NOTE — H&P (Signed)
[name] is an 59 y.o. male.   Chief Complaint: Progressive increasing shortness of breath associated with leg swelling HPI: Patient is 59 year old male with past medical history significant for nonischemic dilated cardiomyopathy history of recent acute coronary syndrome in the setting of cocaine abuse noted to have serially depressed LV systolic function EF of 0000000 with patent coronary arteries, hypertension, non-insulin-dependent diabetes mellitus controlled by diet, history of tobacco abuse, cocaine abuse, EtOH abuse, chronic kidney disease stage IV, recently discharged from the hospital homeless layers mostly in the car and occasionally with a girlfriend came to the ER complaining of progressive increasing shortness of breath associated with leg swelling patient was noted to be hypotensive in the ED with blood pressure of 60/40 and heart rate in 130s sinus tach. Patient denies any using cocaine recently. Patient received IV fluid bolus with increase in his blood pressure to 118/87 with heart rate of 109 sinus tach on the monitor patient presently denies any chest pain. Patient was also noted to have mildly elevated troponin I. Urine drug screen is pending.  Past Medical History  Diagnosis Date  . Hypertension     Past Surgical History  Procedure Laterality Date  . Left heart catheterization with coronary angiogram N/A 08/15/2014    Procedure: LEFT HEART CATHETERIZATION WITH CORONARY ANGIOGRAM;  Surgeon: Clent Demark, MD;  Location: [facility] CATH LAB;  Service: Cardiovascular;  Laterality: N/A;    No family history on file. Social History:  reports that he quit smoking about 10 years ago. He does not have any smokeless tobacco history on file. He reports that he drinks alcohol. He reports that he uses illicit drugs (Cocaine).  Allergies: No Known Allergies   (Not in a hospital admission)  Results for orders placed or performed during the hospital encounter of 09/03/14 (from the past 48  hour(s))  CBC with Differential     Status: Abnormal   Collection Time: 09/03/14  5:58 PM  Result Value Ref Range   WBC 6.5 4.0 - 10.5 K/uL   RBC 4.20 (L) 4.22 - 5.81 MIL/uL   Hemoglobin 14.9 13.0 - 17.0 g/dL   HCT 43.5 39.0 - 52.0 %   MCV 103.6 (H) 78.0 - 100.0 fL   MCH 35.5 (H) 26.0 - 34.0 pg   MCHC 34.3 30.0 - 36.0 g/dL   RDW 12.3 11.5 - 15.5 %   Platelets 215 150 - 400 K/uL   Neutrophils Relative % 62 43 - 77 %   Neutro Abs 4.1 1.7 - 7.7 K/uL   Lymphocytes Relative 28 12 - 46 %   Lymphs Abs 1.8 0.7 - 4.0 K/uL   Monocytes Relative 7 3 - 12 %   Monocytes Absolute 0.4 0.1 - 1.0 K/uL   Eosinophils Relative 2 0 - 5 %   Eosinophils Absolute 0.1 0.0 - 0.7 K/uL   Basophils Relative 1 0 - 1 %   Basophils Absolute 0.1 0.0 - 0.1 K/uL  Troponin I     Status: Abnormal   Collection Time: 09/03/14  5:58 PM  Result Value Ref Range   Troponin I 2.48 (HH) <0.031 ng/mL    Comment:        POSSIBLE MYOCARDIAL ISCHEMIA. SERIAL TESTING RECOMMENDED. REPEATED TO VERIFY CRITICAL RESULT CALLED TO, READ BACK BY AND VERIFIED WITH: Jaci Standard [id number] 09/03/14 D BRADLEY   Brain natriuretic peptide     Status: Abnormal   Collection Time: 09/03/14  6:00 PM  Result Value Ref Range   B Natriuretic Peptide  3572.8 (H) 0.0 - 100.0 pg/mL  I-Stat Troponin, ED (not at [facility])     Status: Abnormal   Collection Time: 09/03/14  6:10 PM  Result Value Ref Range   Troponin i, poc 1.81 (HH) 0.00 - 0.08 ng/mL   Comment 3            Comment: Due to the release kinetics of cTnI, a negative result within the first hours of the onset of symptoms does not rule out myocardial infarction with certainty. If myocardial infarction is still suspected, repeat the test at appropriate intervals.   I-Stat CG4 Lactic Acid, ED     Status: Abnormal   Collection Time: 09/03/14  6:12 PM  Result Value Ref Range   Lactic Acid, Venous 2.78 (HH) 0.5 - 2.0 mmol/L  I-Stat Chem 8, ED     Status: Abnormal   Collection Time: 09/03/14   6:12 PM  Result Value Ref Range   Sodium 140 135 - 145 mmol/L   Potassium 4.3 3.5 - 5.1 mmol/L   Chloride 106 96 - 112 mmol/L   BUN 27 (H) 6 - 23 mg/dL   Creatinine, Ser 1.90 (H) 0.50 - 1.35 mg/dL   Glucose, Bld 125 (H) 70 - 99 mg/dL   Calcium, Ion 1.17 1.13 - 1.30 mmol/L   TCO2 21 0 - 100 mmol/L   Hemoglobin 16.7 13.0 - 17.0 g/dL   HCT 49.0 39.0 - 52.0 %   Dg Chest Portable 1 View  09/03/2014   CLINICAL DATA:  Shortness of breath for 1 month, worse today.  EXAM: PORTABLE CHEST - 1 VIEW  COMPARISON:  PA and lateral chest 08/13/2014.  FINDINGS: Again seen is cardiomegaly. There is no pulmonary edema. Lungs are clear. No pneumothorax or pleural effusion.  IMPRESSION: Cardiomegaly without acute disease.   Electronically Signed   By: Inge Rise M.D.   On: 09/03/2014 18:48    Review of Systems  Constitutional: Negative for fever and chills.  Eyes: Negative for double vision.  Respiratory: Positive for cough and shortness of breath. Negative for sputum production.   Gastrointestinal: Negative for nausea, vomiting and abdominal pain.  Genitourinary: Negative for dysuria.  Neurological: Negative for dizziness.    Blood pressure 108/77, pulse 113, temperature 98.2 F (36.8 C), temperature source Oral, resp. rate 36, height 5\' 8"  (1.727 m), weight 79.379 kg (175 lb), SpO2 98 %. Physical Exam  Constitutional: He is oriented to person, place, and time.  HENT:  Head: Normocephalic and atraumatic.  Eyes: Conjunctivae are normal. Pupils are equal, round, and reactive to light. Left eye exhibits no discharge. No scleral icterus.  Neck: Normal range of motion. Neck supple. JVD present. No tracheal deviation present. No thyromegaly present.  Cardiovascular:  Tachycardic S1 and S2 soft there is soft systolic murmur and S3 gallop noted  Respiratory:  Decreased breath sound at bases with faint bibasilar Rales noted  GI: Soft. Bowel sounds are normal. He exhibits no distension. There is no  tenderness.  Musculoskeletal:  No clubbing cyanosis 1+ edema noted  Neurological: He is alert and oriented to person, place, and time.     Assessment/Plan Probably very small type II MI secondary to hypotension/tachycardia Severe nonischemic dilated cardiomyopathy Hypertension Diabetes mellitus Chronic kidney disease stage IV History of cocaine alcohol and tobacco abuse Plan As per orders  [facility] N 09/03/2014, 8:36 PM

## 2014-09-03 NOTE — ED Notes (Signed)
Pt in from home c/o SOB onset x 1 mth worsening today, pt c/o mid cp, pt hypotensive upon arrival to ED, pt speaks in short sentences, labored breathing, A&O x4, follows commands, anxious, pt rcvd 324 mg ASA, 5 mg Albuterol, 0.5 mg Atrovent, #20 L hand, Bland Span, MD at bedside

## 2014-09-04 LAB — BASIC METABOLIC PANEL
Anion gap: 7 (ref 5–15)
BUN: 24 mg/dL — ABNORMAL HIGH (ref 6–23)
CALCIUM: 8.4 mg/dL (ref 8.4–10.5)
CO2: 22 mmol/L (ref 19–32)
Chloride: 109 mmol/L (ref 96–112)
Creatinine, Ser: 1.65 mg/dL — ABNORMAL HIGH (ref 0.50–1.35)
GFR calc Af Amer: 51 mL/min — ABNORMAL LOW (ref >90)
GFR calc non Af Amer: 44 mL/min — ABNORMAL LOW (ref >90)
Glucose, Bld: 116 mg/dL — ABNORMAL HIGH (ref 70–99)
POTASSIUM: 4.2 mmol/L (ref 3.5–5.1)
SODIUM: 138 mmol/L (ref 135–145)

## 2014-09-04 LAB — LIPID PANEL
CHOLESTEROL: 95 mg/dL (ref 0–200)
HDL: 24 mg/dL — ABNORMAL LOW (ref >39)
LDL CALC: 56 mg/dL (ref 0–99)
Total CHOL/HDL Ratio: 4 RATIO
Triglycerides: 76 mg/dL (ref <150)
VLDL: 15 mg/dL (ref 0–40)

## 2014-09-04 LAB — RAPID URINE DRUG SCREEN, HOSP PERFORMED
AMPHETAMINES: NOT DETECTED
BARBITURATES: NOT DETECTED
Benzodiazepines: NOT DETECTED
Cocaine: NOT DETECTED
Opiates: NOT DETECTED
TETRAHYDROCANNABINOL: POSITIVE — AB

## 2014-09-04 LAB — CBC
HCT: 37.5 % — ABNORMAL LOW (ref 39.0–52.0)
HEMOGLOBIN: 13.5 g/dL (ref 13.0–17.0)
MCH: 38.1 pg — ABNORMAL HIGH (ref 26.0–34.0)
MCHC: 36 g/dL (ref 30.0–36.0)
MCV: 105.9 fL — ABNORMAL HIGH (ref 78.0–100.0)
PLATELETS: 186 10*3/uL (ref 150–400)
RBC: 3.54 MIL/uL — AB (ref 4.22–5.81)
RDW: 14.1 % (ref 11.5–15.5)
WBC: 6.3 10*3/uL (ref 4.0–10.5)

## 2014-09-04 LAB — MRSA PCR SCREENING: MRSA by PCR: NEGATIVE

## 2014-09-04 LAB — TROPONIN I: Troponin I: 1.27 ng/mL (ref <0.031)

## 2014-09-04 MED ORDER — DIGOXIN 125 MCG PO TABS
0.1250 mg | ORAL_TABLET | Freq: Every day | ORAL | Status: DC
  Administered 2014-09-04 – 2014-09-06 (×3): 0.125 mg via ORAL
  Filled 2014-09-04 (×3): qty 1

## 2014-09-04 MED ORDER — LEVALBUTEROL HCL 1.25 MG/0.5ML IN NEBU
1.2500 mg | INHALATION_SOLUTION | Freq: Three times a day (TID) | RESPIRATORY_TRACT | Status: DC
Start: 2014-09-04 — End: 2014-09-05
  Administered 2014-09-04 – 2014-09-05 (×3): 1.25 mg via RESPIRATORY_TRACT
  Filled 2014-09-04 (×7): qty 0.5

## 2014-09-04 MED ORDER — WHITE PETROLATUM GEL
Status: AC
  Administered 2014-09-04: 0.2
  Filled 2014-09-04: qty 1

## 2014-09-04 MED ORDER — GUAIFENESIN ER 600 MG PO TB12
1200.0000 mg | ORAL_TABLET | Freq: Two times a day (BID) | ORAL | Status: DC
  Administered 2014-09-04 – 2014-09-07 (×7): 1200 mg via ORAL
  Filled 2014-09-04 (×8): qty 2

## 2014-09-04 NOTE — Progress Notes (Signed)
Subjective:  Patient denies any chest pain states breathing has improved. Complains of occasional chest congestion and cough no fever or chills Tolerating IV milrinone okay. Renal function improved with good diuresis now Objective:  Vital Signs in the last 24 hours: Temp:  [97.8 F (36.6 C)-98.2 F (36.8 C)] 97.9 F (36.6 C) (02/18 0700) Pulse Rate:  [87-130] 101 (02/18 0900) Resp:  [17-37] 28 (02/18 0900) BP: (93-128)/(50-97) 112/72 mmHg (02/18 0900) SpO2:  [92 %-100 %] 95 % (02/18 0900) FiO2 (%):  [28 %] 28 % (02/17 2243) Weight:  [77 kg (169 lb 12.1 oz)-79.379 kg (175 lb)] 77 kg (169 lb 12.1 oz) (02/18 0246)  Intake/Output from previous day: 02/17 0701 - 02/18 0700 In: 41.4 [I.V.:41.4] Out: 100 [Urine:100] Intake/Output from this shift: Total I/O In: 416 [P.O.:410; I.V.:6] Out: -   Physical Exam: Neck: no adenopathy, no carotid bruit, supple, symmetrical, trachea midline and Positive JVD Lungs: Decreased breath sounds at bases with fine rales Heart: regular rate and rhythm, S1, S2 normal and Soft systolic murmur and S3 gallop noted Abdomen: soft, non-tender; bowel sounds normal; no masses,  no organomegaly Extremities: No clubbing cyanosis trace edema noted  Lab Results:  Recent Labs  09/03/14 2248 09/04/14 0341  WBC 7.1 6.3  HGB 15.8 13.5  PLT 220 186    Recent Labs  09/03/14 2248 09/04/14 0341  NA 138 138  K 4.7 4.2  CL 105 109  CO2 27 22  GLUCOSE 112* 116*  BUN 25* 24*  CREATININE 1.86* 1.65*    Recent Labs  09/03/14 1758  TROPONINI 2.48*   Hepatic Function Panel  Recent Labs  09/03/14 2248  PROT 6.6  ALBUMIN 3.4*  AST 38*  ALT 43  ALKPHOS 247*  BILITOT 1.2    Recent Labs  09/04/14 0341  CHOL 95   No results for input(s): PROTIME in the last 72 hours.  Imaging: Imaging results have been reviewed and Dg Chest Portable 1 View  09/03/2014   CLINICAL DATA:  Shortness of breath for 1 month, worse today.  EXAM: PORTABLE CHEST - 1 VIEW   COMPARISON:  PA and lateral chest 08/13/2014.  FINDINGS: Again seen is cardiomegaly. There is no pulmonary edema. Lungs are clear. No pneumothorax or pleural effusion.  IMPRESSION: Cardiomegaly without acute disease.   Electronically Signed   By: Inge Rise M.D.   On: 09/03/2014 18:48    Cardiac Studies:  Assessment/Plan:  Probably very small type II MI secondary to hypotension/tachycardia Severe nonischemic dilated cardiomyopathy Hypertension Diabetes mellitus Chronic kidney disease stage IV improved with component of cardiorenal syndrome History of cocaine alcohol and tobacco abuse Plan As per orders Check labs in a.m.  LOS: 1 day    [name] N 09/04/2014, 10:51 AM

## 2014-09-04 NOTE — Progress Notes (Signed)
Utilization review completed.  

## 2014-09-04 NOTE — Progress Notes (Deleted)
Pt. HR 130-140's , per Baltazar Najjar, will give metoprolol now and reassess HR, then page her back if the HR does not respond.

## 2014-09-04 NOTE — Clinical Social Work Note (Signed)
CSW Consult Acknowledged:   CSW received a consult for homeless resoruce. CSW met the pt at the bed side. CSW introduce self and purpose of visit. Pt informed the CSW that he no longer needed CSW services. Pt reported he has a list of all the homeless resources the CSW attempted to provided. At this time no additional need are identified CSW will sign off.   [place], MSW, [place]

## 2014-09-05 ENCOUNTER — Encounter (HOSPITAL_COMMUNITY): Payer: Self-pay | Admitting: *Deleted

## 2014-09-05 LAB — CBC
HCT: 35.8 % — ABNORMAL LOW (ref 39.0–52.0)
HEMOGLOBIN: 12.6 g/dL — AB (ref 13.0–17.0)
MCH: 36.8 pg — ABNORMAL HIGH (ref 26.0–34.0)
MCHC: 35.2 g/dL (ref 30.0–36.0)
MCV: 104.7 fL — AB (ref 78.0–100.0)
PLATELETS: 192 10*3/uL (ref 150–400)
RBC: 3.42 MIL/uL — AB (ref 4.22–5.81)
RDW: 13.4 % (ref 11.5–15.5)
WBC: 5.3 10*3/uL (ref 4.0–10.5)

## 2014-09-05 LAB — BASIC METABOLIC PANEL
Anion gap: 3 — ABNORMAL LOW (ref 5–15)
BUN: 17 mg/dL (ref 6–23)
CHLORIDE: 108 mmol/L (ref 96–112)
CO2: 28 mmol/L (ref 19–32)
Calcium: 8.4 mg/dL (ref 8.4–10.5)
Creatinine, Ser: 1.6 mg/dL — ABNORMAL HIGH (ref 0.50–1.35)
GFR calc Af Amer: 52 mL/min — ABNORMAL LOW (ref >90)
GFR, EST NON AFRICAN AMERICAN: 45 mL/min — AB (ref >90)
Glucose, Bld: 84 mg/dL (ref 70–99)
Potassium: 4.2 mmol/L (ref 3.5–5.1)
Sodium: 139 mmol/L (ref 135–145)

## 2014-09-05 LAB — MAGNESIUM: MAGNESIUM: 2.1 mg/dL (ref 1.5–2.5)

## 2014-09-05 LAB — BRAIN NATRIURETIC PEPTIDE: B Natriuretic Peptide: 1123.5 pg/mL — ABNORMAL HIGH (ref 0.0–100.0)

## 2014-09-05 MED ORDER — FUROSEMIDE 10 MG/ML IJ SOLN
40.0000 mg | Freq: Two times a day (BID) | INTRAMUSCULAR | Status: DC
  Administered 2014-09-05 – 2014-09-06 (×2): 40 mg via INTRAVENOUS
  Filled 2014-09-05 (×2): qty 4

## 2014-09-05 MED ORDER — LEVALBUTEROL HCL 1.25 MG/0.5ML IN NEBU
1.2500 mg | INHALATION_SOLUTION | Freq: Four times a day (QID) | RESPIRATORY_TRACT | Status: DC | PRN
  Administered 2014-09-06 – 2014-09-07 (×2): 1.25 mg via RESPIRATORY_TRACT
  Filled 2014-09-05 (×3): qty 0.5

## 2014-09-05 NOTE — Progress Notes (Signed)
Subjective:  Patient denies any chest pain states breathing is slowly improving.  States has been drinking plenty of fluids.  Patient advised to restrict fluid to 1 L per 24 hours  Objective:  Vital Signs in the last 24 hours: Temp:  [97.5 F (36.4 C)-98.5 F (36.9 C)] 98 F (36.7 C) (02/19 0800) Pulse Rate:  [94-111] 100 (02/19 0800) Resp:  [19-33] 22 (02/19 0800) BP: (86-125)/(34-89) 125/86 mmHg (02/19 0800) SpO2:  [91 %-97 %] 92 % (02/19 0851) Weight:  [77.5 kg (170 lb 13.7 oz)] 77.5 kg (170 lb 13.7 oz) (02/19 0300)  Intake/Output from previous day: 02/18 0701 - 02/19 0700 In: 1539.5 [P.O.:1210; I.V.:329.5] Out: 2335 [Urine:2335] Intake/Output from this shift: Total I/O In: 392 [P.O.:240; I.V.:152] Out: 700 [Urine:700]  Physical Exam: Neck: no adenopathy, no carotid bruit and supple, symmetrical, trachea midline Lungs: bibasilar rales noted air entry improved Heart: regular rate and rhythm, S1, S2 normal and soft systolic murmur and S3 gallop noted Abdomen: soft, non-tender; bowel sounds normal; no masses,  no organomegaly Extremities: extremities normal, atraumatic, no cyanosis or edema  Lab Results:  Recent Labs  09/04/14 0341 09/05/14 0413  WBC 6.3 5.3  HGB 13.5 12.6*  PLT 186 192    Recent Labs  09/04/14 0341 09/05/14 0413  NA 138 139  K 4.2 4.2  CL 109 108  CO2 22 28  GLUCOSE 116* 84  BUN 24* 17  CREATININE 1.65* 1.60*    Recent Labs  09/03/14 1758 09/04/14 1655  TROPONINI 2.48* 1.27*   Hepatic Function Panel  Recent Labs  09/03/14 2248  PROT 6.6  ALBUMIN 3.4*  AST 38*  ALT 43  ALKPHOS 247*  BILITOT 1.2    Recent Labs  09/04/14 0341  CHOL 95   No results for input(s): PROTIME in the last 72 hours.  Imaging: Imaging results have been reviewed and Dg Chest Portable 1 View  09/03/2014   CLINICAL DATA:  Shortness of breath for 1 month, worse today.  EXAM: PORTABLE CHEST - 1 VIEW  COMPARISON:  PA and lateral chest 08/13/2014.   FINDINGS: Again seen is cardiomegaly. There is no pulmonary edema. Lungs are clear. No pneumothorax or pleural effusion.  IMPRESSION: Cardiomegaly without acute disease.   Electronically Signed   By: Inge Rise M.D.   On: 09/03/2014 18:48    Cardiac Studies:  Assessment/Plan:  Probably very small type II MI secondary to hypotension/tachycardia Severe nonischemic dilated cardiomyopathy Hypertension Diabetes mellitus Chronic kidney disease stage IV improved with component of cardiorenal syndrome History of cocaine alcohol and tobacco abuse Plan Increase Lasix as per orders Transfe to telemetry  LOS: 2 days    Annaleise Burger N 09/05/2014, 11:42 AM

## 2014-09-05 NOTE — Progress Notes (Signed)
Report called pt transferring to [id number] via w/c with belongings.

## 2014-09-06 LAB — BASIC METABOLIC PANEL
Anion gap: 5 (ref 5–15)
BUN: 18 mg/dL (ref 6–23)
CALCIUM: 8.5 mg/dL (ref 8.4–10.5)
CO2: 30 mmol/L (ref 19–32)
Chloride: 103 mmol/L (ref 96–112)
Creatinine, Ser: 1.37 mg/dL — ABNORMAL HIGH (ref 0.50–1.35)
GFR calc non Af Amer: 55 mL/min — ABNORMAL LOW (ref >90)
GFR, EST AFRICAN AMERICAN: 63 mL/min — AB (ref >90)
Glucose, Bld: 79 mg/dL (ref 70–99)
POTASSIUM: 4.1 mmol/L (ref 3.5–5.1)
SODIUM: 138 mmol/L (ref 135–145)

## 2014-09-06 MED ORDER — GUAIFENESIN-DM 100-10 MG/5ML PO SYRP
5.0000 mL | ORAL_SOLUTION | ORAL | Status: DC | PRN
  Administered 2014-09-06 – 2014-09-07 (×3): 5 mL via ORAL
  Filled 2014-09-06 (×3): qty 5

## 2014-09-06 MED ORDER — INFLUENZA VAC SPLIT QUAD 0.5 ML IM SUSY
0.5000 mL | PREFILLED_SYRINGE | INTRAMUSCULAR | Status: AC
  Administered 2014-09-07: 0.5 mL via INTRAMUSCULAR
  Filled 2014-09-06: qty 0.5

## 2014-09-06 MED ORDER — GUAIFENESIN 100 MG/5ML PO SYRP
200.0000 mg | ORAL_SOLUTION | ORAL | Status: DC | PRN
  Filled 2014-09-06 (×2): qty 10

## 2014-09-06 MED ORDER — DIGOXIN 250 MCG PO TABS
0.2500 mg | ORAL_TABLET | Freq: Every day | ORAL | Status: DC
  Administered 2014-09-07: 0.25 mg via ORAL
  Filled 2014-09-06: qty 1

## 2014-09-06 MED ORDER — FUROSEMIDE 40 MG PO TABS
40.0000 mg | ORAL_TABLET | Freq: Two times a day (BID) | ORAL | Status: DC
  Administered 2014-09-06 – 2014-09-07 (×2): 40 mg via ORAL
  Filled 2014-09-06 (×2): qty 1

## 2014-09-06 MED ORDER — CARVEDILOL 3.125 MG PO TABS: 3.1250 mg | ORAL_TABLET | Freq: Two times a day (BID) | ORAL | Status: DC

## 2014-09-06 MED ORDER — MENTHOL 3 MG MT LOZG
1.0000 | LOZENGE | OROMUCOSAL | Status: DC | PRN
  Filled 2014-09-06: qty 9

## 2014-09-06 MED ORDER — PNEUMOCOCCAL VAC POLYVALENT 25 MCG/0.5ML IJ INJ
0.5000 mL | INJECTION | INTRAMUSCULAR | Status: AC
  Administered 2014-09-07: 0.5 mL via INTRAMUSCULAR
  Filled 2014-09-06: qty 0.5

## 2014-09-06 NOTE — Progress Notes (Signed)
Subjective:  Denies any chest pain states breathing is slowly improving. Not yet ready to go home today wants to wait 1 more day.  Objective:  Vital Signs in the last 24 hours: Temp:  [98 F (36.7 C)-98.8 F (37.1 C)] 98.6 F (37 C) (02/20 0750) Pulse Rate:  [101-113] 109 (02/20 0750) Resp:  [16-28] 18 (02/20 0750) BP: (107-126)/(70-87) 126/81 mmHg (02/20 0750) SpO2:  [90 %-100 %] 100 % (02/20 0750) Weight:  [72.757 kg (160 lb 6.4 oz)] 72.757 kg (160 lb 6.4 oz) (02/20 0421)  Intake/Output from previous day: 02/19 0701 - 02/20 0700 In: 1594.7 [P.O.:1200; I.V.:394.7] Out: 4275 [Urine:4275] Intake/Output from this shift: Total I/O In: 240 [P.O.:240] Out: -   Physical Exam: Neck: no adenopathy, no carotid bruit, no JVD and supple, symmetrical, trachea midline Lungs: Decrease breath sound at bases Heart: regular rate and rhythm, S1, S2 normal and Soft systolic murmur noted Abdomen: soft, non-tender; bowel sounds normal; no masses,  no organomegaly Extremities: extremities normal, atraumatic, no cyanosis or edema  Lab Results:  Recent Labs  09/04/14 0341 09/05/14 0413  WBC 6.3 5.3  HGB 13.5 12.6*  PLT 186 192    Recent Labs  09/05/14 0413 09/06/14 0321  NA 139 138  K 4.2 4.1  CL 108 103  CO2 28 30  GLUCOSE 84 79  BUN 17 18  CREATININE 1.60* 1.37*    Recent Labs  09/03/14 1758 09/04/14 1655  TROPONINI 2.48* 1.27*   Hepatic Function Panel  Recent Labs  09/03/14 2248  PROT 6.6  ALBUMIN 3.4*  AST 38*  ALT 43  ALKPHOS 247*  BILITOT 1.2    Recent Labs  09/04/14 0341  CHOL 95   No results for input(s): PROTIME in the last 72 hours.  Imaging: Imaging results have been reviewed and No results found.  Cardiac Studies:  Assessment/Plan:  Probably very small type II MI secondary to hypotension/tachycardia Severe nonischemic dilated cardiomyopathy Hypertension Diabetes mellitus Chronic kidney disease stage IV improved with component of  cardiorenal syndrome History of cocaine alcohol and tobacco abuse Plan Wean off milrinone Switch Lasix to by mouth as per orders Increase carvedilol as per orders   LOS: 3 days    Demarkus Remmel N 09/06/2014, 10:33 AM

## 2014-09-07 LAB — BASIC METABOLIC PANEL
Anion gap: 6 (ref 5–15)
BUN: 19 mg/dL (ref 6–23)
CHLORIDE: 105 mmol/L (ref 96–112)
CO2: 28 mmol/L (ref 19–32)
Calcium: 8.7 mg/dL (ref 8.4–10.5)
Creatinine, Ser: 1.36 mg/dL — ABNORMAL HIGH (ref 0.50–1.35)
GFR calc Af Amer: 64 mL/min — ABNORMAL LOW (ref >90)
GFR calc non Af Amer: 55 mL/min — ABNORMAL LOW (ref >90)
Glucose, Bld: 88 mg/dL (ref 70–99)
Potassium: 4.3 mmol/L (ref 3.5–5.1)
SODIUM: 139 mmol/L (ref 135–145)

## 2014-09-07 MED ORDER — FUROSEMIDE 40 MG PO TABS: 40.0000 mg | ORAL_TABLET | Freq: Two times a day (BID) | ORAL | Status: DC

## 2014-09-07 MED ORDER — CARVEDILOL 6.25 MG PO TABS: 6.2500 mg | ORAL_TABLET | Freq: Two times a day (BID) | ORAL | Status: DC

## 2014-09-07 MED ORDER — ATORVASTATIN CALCIUM 20 MG PO TABS: 20.0000 mg | ORAL_TABLET | Freq: Every day | ORAL | Status: DC

## 2014-09-07 MED ORDER — DIGOXIN 250 MCG PO TABS: 0.2500 mg | ORAL_TABLET | Freq: Every day | ORAL | Status: DC

## 2014-09-07 NOTE — Discharge Summary (Signed)
Discharge summary dictated on 09/07/2014 dictation number is [phone]

## 2014-09-07 NOTE — Progress Notes (Signed)
Pt up to bathroom and back to bed without difficulty but then had a coughing spell that made his head and let chest and side hurt. Denies increased pain with palpation and deep breathes. VSS. Pt given tylenol, cough medication and Xopenex neb. Pt denies relief after neb. 12 lead EKG done to make sure not cardiac. No acute changes noted on Ekg. Pt going to try and rest to see if improves. Cont to monitor.

## 2014-09-07 NOTE — Discharge Instructions (Signed)

## 2014-09-08 NOTE — Discharge Summary (Signed)
NAME:  [name], [name]                   ACCOUNT NO.:  MEDICAL RECORD NO.:  [id number]  LOCATION:                                 FACILITY:  PHYSICIAN:  Angee Gupton N. Terrence Dupont, M.D. DATE OF BIRTH:  1955/12/12  DATE OF ADMISSION:  09/03/2014 DATE OF DISCHARGE:  09/07/2014                              DISCHARGE SUMMARY   ADMITTING DIAGNOSES: 1. Probably very small type 2 myocardial infarction  secondary to     hypotension/tachycardia. 2. Severe nonischemic dilated cardiomyopathy. 3. Hypertension. 4. Diabetes mellitus. 5. Chronic kidney disease, stage IV. 6. History of cocaine, alcohol, and tobacco abuse.  DISCHARGE DIAGNOSES: 1. Status post probably very small type 2 myocardial infarction     secondary to hypotension/tachycardia due to demand ischemia. 2. Severe nonischemic dilated cardiomyopathy. 3. Compensated systolic congestive heart failure. 4. Hypertension. 5. Diabetes mellitus. 6. Chronic kidney disease stage 4, improved component of cardiorenal     syndrome. 7. History of cocaine, alcohol, and tobacco abuse.  DISCHARGE HOME MEDICATIONS: 1. Digoxin 0.25 mg 1 tablet daily. 2. Lasix 40 mg twice daily. 3. Nitrostat sublingual p.r.n. as before. 4. Ramipril 1.25 mg 1 capsule daily. 5. Atorvastatin 20 mg daily. 6. Carvedilol 6.25 mg twice daily.  DIET:  Low salt, low cholesterol.  Activity as tolerated.  Heart failure instructions have been given.  The patient has been advised to restrict fluid to 1 L per 24 hours and monitor his weight daily.  If he has difficulty breathing, the patient advised to take extra dose of Lasix.  CONDITION AT DISCHARGE:  Stable.  FOLLOWUP:  Follow up with me in 1 week.  BRIEF HISTORY AND HOSPITAL COURSE:  [name] is a 59 year old male with past medical history significant for nonischemic dilated cardiomyopathy, history of recent acute coronary syndrome in the setting of cocaine abuse, noted to have severely depressed LV systolic  function with EF of 20%-25% with patent coronary arteries,  hypertension, non- insulin-dependent diabetes mellitus, controlled by diet, history of tobacco abuse, cocaine abuse, alcohol abuse, chronic kidney disease, stage IV, recently discharged from the hospital.  He is homeless.  He lives mostly in the car and occasionally with girlfriends.  He came to the ER complaining of progressive increasing shortness of breath associated with leg swelling and was noted to be hypotensive in the ED with blood  pressure of 60 over 40 and heart rate in 130s, sinus tach. The patient denies using any cocaine recently.  The patient received IV fluid bolus with increase of blood pressure to 118/87 with heart rate of 109, sinus tach on the monitor.  The patient  presently denies any chest pain, also was noted to have minimally elevated troponin I.  PHYSICAL EXAMINATION:  GENERAL:  He was alert, awake, and oriented. VITAL SIGNS:  Blood pressure when seen in the ED was 108/77, pulse was 113, sinus tach on the monitor. HEENT:  Conjunctivae pink. NECK:  Supple.  JVD was elevated to 10 cm. LUNGS:  Decreased breath sounds at bases with faint bibasilar rales noted after fluid bolus. CARDIOVASCULAR:  S1, S2 was soft.  He was tachycardic.  There was soft systolic murmur and S3 gallop noted.  ABDOMEN:  Soft.  Bowel sounds were present. EXTREMITIES:  There is no clubbing or cyanosis.  There was 1+ edema.  LABORATORY DATA:  His sodium was 140, potassium 4.3, BUN 27, creatinine was 1.90, his troponin I was 2.48, 1.81, 1.38, and 1.27, which is trending down.  ProBNP was 3572.  Repeat BNP was 3580, yesterday on 19th it was 1123, which is trending down.  His cholesterol was 95, triglycerides 76, HDL 24, and LDL was 56.  Repeat BUN was 24, creatinine 1.65 on 2/18.  On 2/20, BUN is 19 and creatinine 1.36.  His hemoglobin was 15.8, hematocrit 46, white count of 7.1.  Chest x-ray done in the ED showed cardiomegaly without  acute disease.  EKG showed sinus tach, right bundle-branch block, and left anterior fascicular block.  No significant changes from last stress.  BRIEF HOSPITAL COURSE:  The patient was admitted to step-down unit and was started on IV Milrinone and IV Lasix with good diuresis.  The patient was restarted on carvedilol and ACE inhibitors during the hospital stay, which he is tolerating it okay.  His renal function is stable.  Milrinone was slowly weaned off.  In fact his renal function improved with IV Milrinone suggesting there is component of cardiorenal syndrome.  Discussed with the patient regarding skilled nursing facility but wanted to go home and mostly lay with his girlfriend.  The patient was advised to refrain from any drug abuse.  Heart failure instructions have been given.  The patient also has been instructed to comply with the medication and followup.     Allegra Lai. Terrence Dupont, M.D.     MNH/MEDQ  D:  09/07/2014  T:  09/07/2014  Job:  [id number]

## 2014-10-27 ENCOUNTER — Encounter (HOSPITAL_COMMUNITY): Payer: Self-pay | Admitting: *Deleted

## 2014-12-17 DIAGNOSIS — Z9289 Personal history of other medical treatment: Secondary | ICD-10-CM

## 2014-12-17 HISTORY — DX: Personal history of other medical treatment: Z92.89

## 2014-12-23 ENCOUNTER — Encounter (HOSPITAL_COMMUNITY): Payer: Self-pay | Admitting: Emergency Medicine

## 2014-12-23 ENCOUNTER — Emergency Department (HOSPITAL_COMMUNITY): Payer: Medicaid Other

## 2014-12-23 ENCOUNTER — Emergency Department (HOSPITAL_COMMUNITY)
Admission: EM | Admit: 2014-12-23 | Discharge: 2014-12-23 | Disposition: A | Discharge status: Home / Self Care | Payer: Medicaid Other | Attending: Emergency Medicine | Admitting: Emergency Medicine

## 2014-12-23 DIAGNOSIS — Z9889 Other specified postprocedural states: Secondary | ICD-10-CM | POA: Insufficient documentation

## 2014-12-23 DIAGNOSIS — Z590 Homelessness: Secondary | ICD-10-CM | POA: Diagnosis not present

## 2014-12-23 DIAGNOSIS — R05 Cough: Secondary | ICD-10-CM | POA: Diagnosis present

## 2014-12-23 DIAGNOSIS — Z87891 Personal history of nicotine dependence: Secondary | ICD-10-CM | POA: Diagnosis not present

## 2014-12-23 DIAGNOSIS — J069 Acute upper respiratory infection, unspecified: Secondary | ICD-10-CM | POA: Diagnosis not present

## 2014-12-23 DIAGNOSIS — Z79899 Other long term (current) drug therapy: Secondary | ICD-10-CM | POA: Insufficient documentation

## 2014-12-23 DIAGNOSIS — I10 Essential (primary) hypertension: Secondary | ICD-10-CM | POA: Diagnosis not present

## 2014-12-23 DIAGNOSIS — R062 Wheezing: Secondary | ICD-10-CM

## 2014-12-23 HISTORY — DX: Homelessness: Z59.0

## 2014-12-23 HISTORY — DX: Homelessness unspecified: Z59.00

## 2014-12-23 LAB — BASIC METABOLIC PANEL
ANION GAP: 10 (ref 5–15)
BUN: 23 mg/dL — ABNORMAL HIGH (ref 6–20)
CHLORIDE: 106 mmol/L (ref 101–111)
CO2: 23 mmol/L (ref 22–32)
Calcium: 9.5 mg/dL (ref 8.9–10.3)
Creatinine, Ser: 1.85 mg/dL — ABNORMAL HIGH (ref 0.61–1.24)
GFR calc non Af Amer: 38 mL/min — ABNORMAL LOW (ref >60)
GFR, EST AFRICAN AMERICAN: 44 mL/min — AB (ref >60)
GLUCOSE: 95 mg/dL (ref 65–99)
Potassium: 4.7 mmol/L (ref 3.5–5.1)
Sodium: 139 mmol/L (ref 135–145)

## 2014-12-23 LAB — CBC WITH DIFFERENTIAL/PLATELET
Basophils Absolute: 0.1 10*3/uL (ref 0.0–0.1)
Basophils Relative: 1 % (ref 0–1)
Eosinophils Absolute: 0 10*3/uL (ref 0.0–0.7)
Eosinophils Relative: 0 % (ref 0–5)
HEMATOCRIT: 45.7 % (ref 39.0–52.0)
HEMOGLOBIN: 15.3 g/dL (ref 13.0–17.0)
LYMPHS ABS: 2.1 10*3/uL (ref 0.7–4.0)
Lymphocytes Relative: 26 % (ref 12–46)
MCH: 33.3 pg (ref 26.0–34.0)
MCHC: 33.5 g/dL (ref 30.0–36.0)
MCV: 99.3 fL (ref 78.0–100.0)
Monocytes Absolute: 0.9 10*3/uL (ref 0.1–1.0)
Monocytes Relative: 11 % (ref 3–12)
NEUTROS PCT: 62 % (ref 43–77)
Neutro Abs: 4.9 10*3/uL (ref 1.7–7.7)
Platelets: 209 10*3/uL (ref 150–400)
RBC: 4.6 MIL/uL (ref 4.22–5.81)
RDW: 12.8 % (ref 11.5–15.5)
WBC: 7.8 10*3/uL (ref 4.0–10.5)

## 2014-12-23 LAB — BRAIN NATRIURETIC PEPTIDE: B Natriuretic Peptide: 3468.5 pg/mL — ABNORMAL HIGH (ref 0.0–100.0)

## 2014-12-23 MED ORDER — ALBUTEROL (5 MG/ML) CONTINUOUS INHALATION SOLN
10.0000 mg/h | INHALATION_SOLUTION | RESPIRATORY_TRACT | Status: DC
  Administered 2014-12-23: 10 mg/h via RESPIRATORY_TRACT
  Filled 2014-12-23: qty 20

## 2014-12-23 MED ORDER — IPRATROPIUM BROMIDE 0.02 % IN SOLN
1.0000 mg | Freq: Once | RESPIRATORY_TRACT | Status: AC
  Administered 2014-12-23: 1 mg via RESPIRATORY_TRACT
  Filled 2014-12-23: qty 5

## 2014-12-23 MED ORDER — PREDNISONE 20 MG PO TABS
60.0000 mg | ORAL_TABLET | Freq: Once | ORAL | Status: AC
  Administered 2014-12-23: 60 mg via ORAL
  Filled 2014-12-23: qty 3

## 2014-12-23 MED ORDER — MAGNESIUM SULFATE 2 GM/50ML IV SOLN
2.0000 g | Freq: Once | INTRAVENOUS | Status: AC
  Administered 2014-12-23: 2 g via INTRAVENOUS
  Filled 2014-12-23: qty 50

## 2014-12-23 MED ORDER — PREDNISONE 20 MG PO TABS: 60.0000 mg | ORAL_TABLET | Freq: Every day | ORAL | Status: DC

## 2014-12-23 MED ORDER — ALBUTEROL SULFATE HFA 108 (90 BASE) MCG/ACT IN AERS
2.0000 | INHALATION_SPRAY | Freq: Once | RESPIRATORY_TRACT | Status: AC
  Administered 2014-12-23: 2 via RESPIRATORY_TRACT
  Filled 2014-12-23: qty 6.7

## 2014-12-23 NOTE — Discharge Instructions (Signed)
How to Use an Inhaler [name], use your albuterol inhaler every 6hours for the next 2 days, then only use it as needed.  Take steroids for 4 more days.  See a primary physician within 3 days for close follow up.  If symptoms worsen, come back to the ED immediately.  Thank you. Using your inhaler correctly is very important. Good technique will make sure that the medicine reaches your lungs.  HOW TO USE AN INHALER:  Take the cap off the inhaler.  If this is the first time using your inhaler, you need to prime it. Shake the inhaler for 5 seconds. Release four puffs into the air, away from your face. Ask your doctor for help if you have questions.  Shake the inhaler for 5 seconds.  Turn the inhaler so the bottle is above the mouthpiece.  Put your pointer finger on top of the bottle. Your thumb holds the bottom of the inhaler.  Open your mouth.  Either hold the inhaler away from your mouth (the width of 2 fingers) or place your lips tightly around the mouthpiece. Ask your doctor which way to use your inhaler.  Breathe out as much air as possible.  Breathe in and push down on the bottle 1 time to release the medicine. You will feel the medicine go in your mouth and throat.  Continue to take a deep breath in very slowly. Try to fill your lungs.  After you have breathed in completely, hold your breath for 10 seconds. This will help the medicine to settle in your lungs. If you cannot hold your breath for 10 seconds, hold it for as long as you can before you breathe out.  Breathe out slowly, through pursed lips. Whistling is an example of pursed lips.  If your doctor has told you to take more than 1 puff, wait at least 15-30 seconds between puffs. This will help you get the best results from your medicine. Do not use the inhaler more than your doctor tells you to.  Put the cap back on the inhaler.  Follow the directions from your doctor or from the inhaler package about cleaning the  inhaler. If you use more than one inhaler, ask your doctor which inhalers to use and what order to use them in. Ask your doctor to help you figure out when you will need to refill your inhaler.  If you use a steroid inhaler, always rinse your mouth with water after your last puff, gargle and spit out the water. Do not swallow the water. GET HELP IF:  The inhaler medicine only partially helps to stop wheezing or shortness of breath.  You are having trouble using your inhaler.  You have some increase in thick spit (phlegm). GET HELP RIGHT AWAY IF:  The inhaler medicine does not help your wheezing or shortness of breath or you have tightness in your chest.  You have dizziness, headaches, or fast heart rate.  You have chills, fever, or night sweats.  You have a large increase of thick spit, or your thick spit is bloody. MAKE SURE YOU:   Understand these instructions.  Will watch your condition.  Will get help right away if you are not doing well or get worse. Document Released: 04/12/2008 Document Revised: 04/24/2013 Document Reviewed: 01/31/2013 [facility] Patient Information 2015 [place], [place]. This information is not intended to replace advice given to you by your health care provider. Make sure you discuss any questions you have with your health care  provider. Bronchospasm A bronchospasm is when the tubes that carry air in and out of your lungs (airways) spasm or tighten. During a bronchospasm it is hard to breathe. This is because the airways get smaller. A bronchospasm can be triggered by:  Allergies. These may be to animals, pollen, food, or mold.  Infection. This is a common cause of bronchospasm.  Exercise.  Irritants. These include pollution, cigarette smoke, strong odors, aerosol sprays, and paint fumes.  Weather changes.  Stress.  Being emotional. HOME CARE   Always have a plan for getting help. Know when to call your doctor and local emergency services (911 in  the [place] Know where you can get emergency care.  Only take medicines as told by your doctor.  If you were prescribed an inhaler or nebulizer machine, ask your doctor how to use it correctly. Always use a spacer with your inhaler if you were given one.  Stay calm during an attack. Try to relax and breathe more slowly.  Control your home environment:  Change your heating and air conditioning filter at least once a month.  Limit your use of fireplaces and wood stoves.  Do not  smoke. Do not  allow smoking in your home.  Avoid perfumes and fragrances.  Get rid of pests (such as roaches and mice) and their droppings.  Throw away plants if you see mold on them.  Keep your house clean and dust free.  Replace carpet with wood, tile, or vinyl flooring. Carpet can trap dander and dust.  Use allergy-proof pillows, mattress covers, and box spring covers.  Wash bed sheets and blankets every week in hot water. Dry them in a dryer.  Use blankets that are made of polyester or cotton.  Wash hands frequently. GET HELP IF:  You have muscle aches.  You have chest pain.  The thick spit you spit or cough up (sputum) changes from clear or white to yellow, green, gray, or bloody.  The thick spit you spit or cough up gets thicker.  There are problems that may be related to the medicine you are given such as:  A rash.  Itching.  Swelling.  Trouble breathing. GET HELP RIGHT AWAY IF:  You feel you cannot breathe or catch your breath.  You cannot stop coughing.  Your treatment is not helping you breathe better.  You have very bad chest pain. MAKE SURE YOU:   Understand these instructions.  Will watch your condition.  Will get help right away if you are not doing well or get worse. Document Released: 05/01/2009 Document Revised: 07/09/2013 Document Reviewed: 12/25/2012 [facility] Patient Information 2015 [place], [place]. This information is not intended to replace advice given  to you by your health care provider. Make sure you discuss any questions you have with your health care provider. Upper Respiratory Infection, Adult An upper respiratory infection (URI) is also known as the common cold. It is often caused by a type of germ (virus). Colds are easily spread (contagious). You can pass it to others by kissing, coughing, sneezing, or drinking out of the same glass. Usually, you get better in 1 or 2 weeks.  HOME CARE   Only take medicine as told by your doctor.  Use a warm mist humidifier or breathe in steam from a hot shower.  Drink enough water and fluids to keep your pee (urine) clear or pale yellow.  Get plenty of rest.  Return to work when your temperature is back to normal or as told  by your doctor. You may use a face mask and wash your hands to stop your cold from spreading. GET HELP RIGHT AWAY IF:   After the first few days, you feel you are getting worse.  You have questions about your medicine.  You have chills, shortness of breath, or brown or red spit (mucus).  You have yellow or brown snot (nasal discharge) or pain in the face, especially when you bend forward.  You have a fever, puffy (swollen) neck, pain when you swallow, or white spots in the back of your throat.  You have a bad headache, ear pain, sinus pain, or chest pain.  You have a high-pitched whistling sound when you breathe in and out (wheezing).  You have a lasting cough or cough up blood.  You have sore muscles or a stiff neck. MAKE SURE YOU:   Understand these instructions.  Will watch your condition.  Will get help right away if you are not doing well or get worse. Document Released: 12/21/2007 Document Revised: 09/26/2011 Document Reviewed: 10/09/2013 [facility] Patient Information 2015 [place], [place]. This information is not intended to replace advice given to you by your health care provider. Make sure you discuss any questions you have with your health care  provider.

## 2014-12-23 NOTE — ED Provider Notes (Signed)
CSN: [id number]     Arrival date & time 12/23/14  0005 History   First MD Initiated Contact with Patient 12/23/14 0434     Chief Complaint  Patient presents with  . Cough     (Consider location/radiation/quality/duration/timing/severity/associated sxs/prior Treatment) HPI [name] is a 59 y.o. male with past medical history of hypertension presenting today with respiratory complaints. He states for the past 3 days he has had subjective fevers and productive cough of brown sputum. He has felt congestion as well. He admits to runny nose. He has sick contacts and his children with similar complaints. Patient also has a history of heart failure but states he has been taking his medication normally. He has sleep orthopnea and dyspnea on exertion. He denies swelling in his legs. He denies history of asthma COPD, he has no albuterol inhaler. Patient has no further complaints.  10 Systems reviewed and are negative for acute change except as noted in the HPI.      Past Medical History  Diagnosis Date  . Hypertension   . Homelessness    Past Surgical History  Procedure Laterality Date  . Left heart catheterization with coronary angiogram N/A 08/15/2014    Procedure: LEFT HEART CATHETERIZATION WITH CORONARY ANGIOGRAM;  Surgeon: Clent Demark, MD;  Location: [facility] CATH LAB;  Service: Cardiovascular;  Laterality: N/A;   No family history on file. History  Substance Use Topics  . Smoking status: Former Smoker    Quit date: 02/19/2004  . Smokeless tobacco: Not on file  . Alcohol Use: Yes    Review of Systems    Allergies  Review of patient's allergies indicates no known allergies.  Home Medications   Prior to Admission medications   Medication Sig Start Date End Date Taking? Authorizing Provider  atorvastatin (LIPITOR) 20 MG tablet Take 1 tablet (20 mg total) by mouth daily. 09/07/14   Charolette Forward, MD  carvedilol (COREG) 6.25 MG tablet Take 1 tablet (6.25 mg total) by mouth 2  (two) times daily with a meal. 09/07/14   Charolette Forward, MD  digoxin (LANOXIN) 0.25 MG tablet Take 1 tablet (0.25 mg total) by mouth daily. 09/07/14   Charolette Forward, MD  furosemide (LASIX) 40 MG tablet Take 1 tablet (40 mg total) by mouth 2 (two) times daily. 09/07/14   Charolette Forward, MD  nitroGLYCERIN (NITROSTAT) 0.4 MG SL tablet Place 1 tablet (0.4 mg total) under the tongue every 5 (five) minutes x 3 doses as needed for chest pain. 08/16/14   Dixie Dials, MD  ramipril (ALTACE) 1.25 MG capsule Take 1 capsule (1.25 mg total) by mouth daily. 08/16/14   Dixie Dials, MD   BP 121/98 mmHg  Pulse 98  Temp(Src) 99.2 F (37.3 C) (Oral)  Resp 14  SpO2 100% Physical Exam  Constitutional: He is oriented to person, place, and time. Vital signs are normal. He appears well-developed and well-nourished.  Non-toxic appearance. He does not appear ill. No distress.  HENT:  Head: Normocephalic and atraumatic.  Nose: Nose normal.  Mouth/Throat: Oropharynx is clear and moist. No oropharyngeal exudate.  Eyes: Conjunctivae and EOM are normal. Pupils are equal, round, and reactive to light. No scleral icterus.  Neck: Normal range of motion. Neck supple. No tracheal deviation, no edema, no erythema and normal range of motion present. No thyroid mass and no thyromegaly present.  Cardiovascular: Normal rate, regular rhythm, S1 normal, S2 normal, normal heart sounds, intact distal pulses and normal pulses.  Exam reveals no gallop and no  friction rub.   No murmur heard. Pulses:      Radial pulses are 2+ on the right side, and 2+ on the left side.       Dorsalis pedis pulses are 2+ on the right side, and 2+ on the left side.  Pulmonary/Chest: Effort normal. No respiratory distress. He has wheezes. He has no rhonchi. He has no rales.  Abdominal: Soft. Normal appearance and bowel sounds are normal. He exhibits no distension, no ascites and no mass. There is no hepatosplenomegaly. There is no tenderness. There is no rebound,  no guarding and no CVA tenderness.  Musculoskeletal: Normal range of motion. He exhibits no edema or tenderness.  Lymphadenopathy:    He has no cervical adenopathy.  Neurological: He is alert and oriented to person, place, and time. He has normal strength. No cranial nerve deficit or sensory deficit. He exhibits normal muscle tone.  Skin: Skin is warm, dry and intact. No petechiae and no rash noted. He is not diaphoretic. No erythema. No pallor.  Psychiatric: He has a normal mood and affect. His behavior is normal. Judgment normal.  Nursing note and vitals reviewed.   ED Course  Procedures (including critical care time) Labs Review Labs Reviewed  BASIC METABOLIC PANEL - Abnormal; Notable for the following:    BUN 23 (*)    Creatinine, Ser 1.85 (*)    GFR calc non Af Amer 38 (*)    GFR calc Af Amer 44 (*)    All other components within normal limits  CBC WITH DIFFERENTIAL/PLATELET  BRAIN NATRIURETIC PEPTIDE    Imaging Review Dg Chest 2 View  12/23/2014   CLINICAL DATA:  Cough, fever and shortness of breath for 2 days.  EXAM: CHEST  2 VIEW  COMPARISON:  09/03/2014  FINDINGS: Cardiomegaly is unchanged. No pulmonary edema, consolidation, pleural effusion or pneumothorax. No acute osseous abnormality.  IMPRESSION: Stable cardiomegaly without localizing pulmonary process.   Electronically Signed   By: Jeb Levering M.D.   On: 12/23/2014 01:37     EKG Interpretation None      MDM   Final diagnoses:  None  Patient presents to the emergency department for productive cough, subjective fevers and chest congestion for 3 days. Chest x-ray is negative for pneumonia. Physical exam reveals wheezing. We'll treat with albuterol treatment, prednisone, magnesium. Also obtain laboratory studies to evaluate for electrolytes and creatinine with Lasix use.  Labs unremarkable. Creatinine is 1.85, patient's baseline appears to be between 1.3 and 1.8. Upon repeat evaluation the patient, his wheezing  has resolved. He symptomatically feels better as well. We'll discharge with 4 more days of prednisone treatment as well as albuterol inhaler to be dispensed to the patient. Primary care follow-up was advised within the next 3 days. Patient is now tachycardic, likely due to albuterol treatment. Otherwise his vital signs were within his normal limits and he is safe for discharge.   Everlene Balls, MD 12/23/14 [phone]

## 2014-12-23 NOTE — ED Notes (Signed)
Pt. arrived with EMS from street reports persistent productive cough and chest congestion onset 3 days ago , denies fever or chills. Respirations unlabored .

## 2014-12-23 NOTE — ED Notes (Signed)
MD at bedside. 

## 2015-01-06 ENCOUNTER — Encounter (HOSPITAL_COMMUNITY): Payer: Self-pay | Admitting: Emergency Medicine

## 2015-01-06 ENCOUNTER — Inpatient Hospital Stay (HOSPITAL_COMMUNITY)
Admission: EM | Admit: 2015-01-06 | Discharge: 2015-01-09 | DRG: 917 | Disposition: A | Discharge status: Home / Self Care | Payer: Medicaid Other | Attending: Cardiology | Admitting: Cardiology

## 2015-01-06 ENCOUNTER — Emergency Department (HOSPITAL_COMMUNITY): Payer: Medicaid Other

## 2015-01-06 DIAGNOSIS — R52 Pain, unspecified: Secondary | ICD-10-CM

## 2015-01-06 DIAGNOSIS — T405X1A Poisoning by cocaine, accidental (unintentional), initial encounter: Principal | ICD-10-CM | POA: Diagnosis present

## 2015-01-06 DIAGNOSIS — E1122 Type 2 diabetes mellitus with diabetic chronic kidney disease: Secondary | ICD-10-CM | POA: Diagnosis present

## 2015-01-06 DIAGNOSIS — Z7982 Long term (current) use of aspirin: Secondary | ICD-10-CM

## 2015-01-06 DIAGNOSIS — I129 Hypertensive chronic kidney disease with stage 1 through stage 4 chronic kidney disease, or unspecified chronic kidney disease: Secondary | ICD-10-CM | POA: Diagnosis present

## 2015-01-06 DIAGNOSIS — N184 Chronic kidney disease, stage 4 (severe): Secondary | ICD-10-CM | POA: Diagnosis present

## 2015-01-06 DIAGNOSIS — Z7952 Long term (current) use of systemic steroids: Secondary | ICD-10-CM

## 2015-01-06 DIAGNOSIS — I5023 Acute on chronic systolic (congestive) heart failure: Secondary | ICD-10-CM | POA: Diagnosis present

## 2015-01-06 DIAGNOSIS — Z79899 Other long term (current) drug therapy: Secondary | ICD-10-CM

## 2015-01-06 DIAGNOSIS — Z590 Homelessness: Secondary | ICD-10-CM

## 2015-01-06 DIAGNOSIS — I249 Acute ischemic heart disease, unspecified: Secondary | ICD-10-CM | POA: Diagnosis present

## 2015-01-06 DIAGNOSIS — F1410 Cocaine abuse, uncomplicated: Secondary | ICD-10-CM | POA: Diagnosis present

## 2015-01-06 DIAGNOSIS — F1010 Alcohol abuse, uncomplicated: Secondary | ICD-10-CM | POA: Diagnosis present

## 2015-01-06 DIAGNOSIS — I452 Bifascicular block: Secondary | ICD-10-CM | POA: Diagnosis present

## 2015-01-06 DIAGNOSIS — I214 Non-ST elevation (NSTEMI) myocardial infarction: Secondary | ICD-10-CM

## 2015-01-06 DIAGNOSIS — F17210 Nicotine dependence, cigarettes, uncomplicated: Secondary | ICD-10-CM | POA: Diagnosis present

## 2015-01-06 DIAGNOSIS — I420 Dilated cardiomyopathy: Secondary | ICD-10-CM | POA: Diagnosis present

## 2015-01-06 HISTORY — DX: Headache: R51

## 2015-01-06 HISTORY — DX: Unspecified osteoarthritis, unspecified site: M19.90

## 2015-01-06 HISTORY — DX: Anxiety disorder, unspecified: F41.9

## 2015-01-06 HISTORY — DX: Pneumonia, unspecified organism: J18.9

## 2015-01-06 HISTORY — DX: Depression, unspecified: F32.A

## 2015-01-06 HISTORY — DX: Pure hypercholesterolemia, unspecified: E78.00

## 2015-01-06 HISTORY — DX: Major depressive disorder, single episode, unspecified: F32.9

## 2015-01-06 HISTORY — DX: Acute myocardial infarction, unspecified: I21.9

## 2015-01-06 HISTORY — DX: Non-ST elevation (NSTEMI) myocardial infarction: I21.4

## 2015-01-06 HISTORY — DX: Hesitancy of micturition: R39.11

## 2015-01-06 HISTORY — DX: Headache, unspecified: R51.9

## 2015-01-06 LAB — CBC WITH DIFFERENTIAL/PLATELET
Basophils Absolute: 0 10*3/uL (ref 0.0–0.1)
Basophils Relative: 1 % (ref 0–1)
Eosinophils Absolute: 0 10*3/uL (ref 0.0–0.7)
Eosinophils Relative: 1 % (ref 0–5)
HCT: 39.4 % (ref 39.0–52.0)
Hemoglobin: 13.1 g/dL (ref 13.0–17.0)
Lymphocytes Relative: 41 % (ref 12–46)
Lymphs Abs: 1.8 10*3/uL (ref 0.7–4.0)
MCH: 32.8 pg (ref 26.0–34.0)
MCHC: 33.2 g/dL (ref 30.0–36.0)
MCV: 98.7 fL (ref 78.0–100.0)
Monocytes Absolute: 0.4 10*3/uL (ref 0.1–1.0)
Monocytes Relative: 9 % (ref 3–12)
Neutro Abs: 2.1 10*3/uL (ref 1.7–7.7)
Neutrophils Relative %: 48 % (ref 43–77)
Platelets: 267 10*3/uL (ref 150–400)
RBC: 3.99 MIL/uL — ABNORMAL LOW (ref 4.22–5.81)
RDW: 12.7 % (ref 11.5–15.5)
WBC: 4.4 10*3/uL (ref 4.0–10.5)

## 2015-01-06 LAB — I-STAT CHEM 8, ED
BUN: 18 mg/dL (ref 6–20)
CALCIUM ION: 1.16 mmol/L (ref 1.13–1.30)
CHLORIDE: 107 mmol/L (ref 101–111)
Creatinine, Ser: 1.8 mg/dL — ABNORMAL HIGH (ref 0.61–1.24)
Glucose, Bld: 112 mg/dL — ABNORMAL HIGH (ref 65–99)
HEMATOCRIT: 41 % (ref 39.0–52.0)
Hemoglobin: 13.9 g/dL (ref 13.0–17.0)
Potassium: 3.8 mmol/L (ref 3.5–5.1)
SODIUM: 140 mmol/L (ref 135–145)
TCO2: 17 mmol/L (ref 0–100)

## 2015-01-06 LAB — I-STAT TROPONIN, ED: Troponin i, poc: 0.18 ng/mL (ref 0.00–0.08)

## 2015-01-06 LAB — DIGOXIN LEVEL: Digoxin Level: 0.3 ng/mL — ABNORMAL LOW (ref 0.8–2.0)

## 2015-01-06 MED ORDER — NITROGLYCERIN 0.4 MG SL SUBL
0.4000 mg | SUBLINGUAL_TABLET | SUBLINGUAL | Status: DC | PRN
  Filled 2015-01-06 (×2): qty 1

## 2015-01-06 MED ORDER — ALUM & MAG HYDROXIDE-SIMETH 200-200-20 MG/5ML PO SUSP
15.0000 mL | Freq: Once | ORAL | Status: AC
  Administered 2015-01-07: 15 mL via ORAL
  Filled 2015-01-06: qty 30

## 2015-01-06 NOTE — ED Notes (Signed)
Pt arrives from home via [facility], c/o CP beginning today, N/V yesterday and today, pt reports N/V resolved. Pt reports diarrhea x 2 days.  Pt reports diarrhea resolved with diarrhea. Pt reports 4/10 substernal CP.  Resp e/u.

## 2015-01-06 NOTE — ED Provider Notes (Addendum)
CSN: [id number]     Arrival date & time 01/06/15  2307 History  This chart was scribed for Whitney Bingaman, MD by Delphia Grates, ED Scribe. This patient was seen in room A05C/A05C and the patient's care was started at 11:12 PM.    Chief Complaint  Patient presents with  . Chest Pain    Patient is a 59 y.o. male presenting with chest pain. The history is provided by the patient. No language interpreter was used.  Chest Pain Pain location:  Substernal area Pain quality: aching and burning   Pain radiates to:  Does not radiate Pain radiates to the back: no   Pain severity now: 6-7/10. Onset quality:  Unable to specify Duration:  1 hour Timing:  Constant Progression:  Improving Chronicity:  New Context comment:  Ambulating in house Relieved by:  Nitroglycerin Worsened by:  Nothing tried Associated symptoms: cough, lower extremity edema, shortness of breath (chronic) and vomiting   Risk factors: hypertension      HPI Comments: [name] is a 59 y.o. male, brought in by ambulance, who presents to the Emergency Department complaining of burning, aching, non-radiating, 6-7/10, substernal chest pain that began 1 hour ago while walking around the house. There is associated 5 episodes vomiting 12-24 hours ago, in addition to diarrhea and cough. He also mentions BLE swelling 2-3 days ago.He states is always SOB when ambulating and lying down and states he has to elevate his head while sleeping. Patient has taken a total 3 nitroglycerin, 2 given by ems which improved his pain from a 7/10 to a 3/10. Patient has history of a left heart catheterization approximately 5 months ago. He states he is currently out of some of his medications. Patient states his cardiologist, Dr. Terrence Dupont, is on vacation at this time and has not been able to seen. Patient states he is a nonsmoker.   Past Medical History  Diagnosis Date  . Hypertension   . Homelessness    Past Surgical History  Procedure Laterality  Date  . Left heart catheterization with coronary angiogram N/A 08/15/2014    Procedure: LEFT HEART CATHETERIZATION WITH CORONARY ANGIOGRAM;  Surgeon: Clent Demark, MD;  Location: [facility] CATH LAB;  Service: Cardiovascular;  Laterality: N/A;   History reviewed. No pertinent family history. History  Substance Use Topics  . Smoking status: Former Smoker    Quit date: 02/19/2004  . Smokeless tobacco: Not on file  . Alcohol Use: 8.4 oz/week    14 Cans of beer per week     Comment: pt reports daily beer drinker    Review of Systems  Respiratory: Positive for cough and shortness of breath (chronic).   Cardiovascular: Positive for chest pain.  Gastrointestinal: Positive for vomiting and diarrhea.  All other systems reviewed and are negative.     Allergies  Review of patient's allergies indicates no known allergies.  Home Medications   Prior to Admission medications   Medication Sig Start Date End Date Taking? Authorizing Provider  atorvastatin (LIPITOR) 20 MG tablet Take 1 tablet (20 mg total) by mouth daily. 09/07/14   Charolette Forward, MD  carvedilol (COREG) 6.25 MG tablet Take 1 tablet (6.25 mg total) by mouth 2 (two) times daily with a meal. 09/07/14   Charolette Forward, MD  digoxin (LANOXIN) 0.25 MG tablet Take 1 tablet (0.25 mg total) by mouth daily. 09/07/14   Charolette Forward, MD  furosemide (LASIX) 40 MG tablet Take 1 tablet (40 mg total) by mouth 2 (two) times daily.  09/07/14   Charolette Forward, MD  nitroGLYCERIN (NITROSTAT) 0.4 MG SL tablet Place 1 tablet (0.4 mg total) under the tongue every 5 (five) minutes x 3 doses as needed for chest pain. 08/16/14   Dixie Dials, MD  predniSONE (DELTASONE) 20 MG tablet Take 3 tablets (60 mg total) by mouth daily. 12/23/14   Everlene Balls, MD  ramipril (ALTACE) 1.25 MG capsule Take 1 capsule (1.25 mg total) by mouth daily. 08/16/14   Dixie Dials, MD   Ht 5\' 8"  (1.727 m)  Wt 154 lb (69.854 kg)  BMI 23.42 kg/m2 Physical Exam  Constitutional: He is oriented to  person, place, and time. He appears well-developed and well-nourished. No distress.  HENT:  Head: Normocephalic and atraumatic.  Mouth/Throat: Oropharynx is clear and moist. No oropharyngeal exudate.  Mucous membranes are moist.  Eyes: Conjunctivae and EOM are normal. Pupils are equal, round, and reactive to light.  Neck: Normal range of motion. Neck supple. No tracheal deviation present.  Trachea is midline  Cardiovascular: Normal rate, regular rhythm and normal heart sounds.   Pulmonary/Chest: Effort normal. No respiratory distress. He has decreased breath sounds.  Diminished at the bases.  Abdominal: Soft. Bowel sounds are normal. He exhibits no mass. There is tenderness in the epigastric area. There is no rebound and no guarding.  Epigastric tenderness; mild  Musculoskeletal: Normal range of motion. He exhibits no edema.  No edema  Neurological: He is alert and oriented to person, place, and time. He has normal reflexes.  Lower DTRs intact.  Skin: Skin is warm and dry.  Psychiatric: He has a normal mood and affect. His behavior is normal.  Nursing note and vitals reviewed.   ED Course  Procedures (including critical care time)    COORDINATION OF CARE: At 2318 Discussed treatment plan with patient. Patient agrees.   Labs Review Labs Reviewed  CBC WITH DIFFERENTIAL/PLATELET  DIGOXIN LEVEL  URINE RAPID DRUG SCREEN, HOSP PERFORMED  I-STAT CHEM 8, ED  I-STAT TROPOININ, ED    Imaging Review No results found.     EKG Interpretation  Date/Time:  Tuesday January 06 2015 23:17:07 EDT Ventricular Rate:  91 PR Interval:  221 QRS Duration: 176 QT Interval:  418 QTC Calculation: 514 R Axis:   -85 Text Interpretation:  Sinus rhythm Prolonged PR interval RBBB and LAFB Confirmed by [facility]  MD, Zareena Willis ([id number]) on 01/06/2015 11:25:19 PM        MDM   Final diagnoses:  Pain    Pain free in the ED  Case d/w Dr. Terrence Dupont by phone.      Results for orders placed or  performed during the hospital encounter of 01/06/15  CBC with Differential/Platelet  Result Value Ref Range   WBC 4.4 4.0 - 10.5 K/uL   RBC 3.99 (L) 4.22 - 5.81 MIL/uL   Hemoglobin 13.1 13.0 - 17.0 g/dL   HCT 39.4 39.0 - 52.0 %   MCV 98.7 78.0 - 100.0 fL   MCH 32.8 26.0 - 34.0 pg   MCHC 33.2 30.0 - 36.0 g/dL   RDW 12.7 11.5 - 15.5 %   Platelets 267 150 - 400 K/uL   Neutrophils Relative % 48 43 - 77 %   Neutro Abs 2.1 1.7 - 7.7 K/uL   Lymphocytes Relative 41 12 - 46 %   Lymphs Abs 1.8 0.7 - 4.0 K/uL   Monocytes Relative 9 3 - 12 %   Monocytes Absolute 0.4 0.1 - 1.0 K/uL   Eosinophils Relative 1 0 -  5 %   Eosinophils Absolute 0.0 0.0 - 0.7 K/uL   Basophils Relative 1 0 - 1 %   Basophils Absolute 0.0 0.0 - 0.1 K/uL  Digoxin level  Result Value Ref Range   Digoxin Level 0.3 (L) 0.8 - 2.0 ng/mL  Urine rapid drug screen (hosp performed)  Result Value Ref Range   Opiates NONE DETECTED NONE DETECTED   Cocaine POSITIVE (A) NONE DETECTED   Benzodiazepines NONE DETECTED NONE DETECTED   Amphetamines NONE DETECTED NONE DETECTED   Tetrahydrocannabinol NONE DETECTED NONE DETECTED   Barbiturates NONE DETECTED NONE DETECTED  Brain natriuretic peptide  Result Value Ref Range   B Natriuretic Peptide 4399.7 (H) 0.0 - 100.0 pg/mL  I-stat chem 8, ed  Result Value Ref Range   Sodium 140 135 - 145 mmol/L   Potassium 3.8 3.5 - 5.1 mmol/L   Chloride 107 101 - 111 mmol/L   BUN 18 6 - 20 mg/dL   Creatinine, Ser 1.80 (H) 0.61 - 1.24 mg/dL   Glucose, Bld 112 (H) 65 - 99 mg/dL   Calcium, Ion 1.16 1.13 - 1.30 mmol/L   TCO2 17 0 - 100 mmol/L   Hemoglobin 13.9 13.0 - 17.0 g/dL   HCT 41.0 39.0 - 52.0 %  I-stat troponin, ED  Result Value Ref Range   Troponin i, poc 0.18 (HH) 0.00 - 0.08 ng/mL   Comment NOTIFIED PHYSICIAN    Comment 3          I-stat troponin, ED  Result Value Ref Range   Troponin i, poc 0.54 (HH) 0.00 - 0.08 ng/mL   Comment NOTIFIED PHYSICIAN    Comment 3          I-stat  troponin, ED  Result Value Ref Range   Troponin i, poc 2.09 (HH) 0.00 - 0.08 ng/mL   Comment NOTIFIED PHYSICIAN    Comment 3           Dg Chest 2 View  01/07/2015   CLINICAL DATA:  Acute onset of shortness of breath, generalized chest pain, vomiting and diarrhea. Initial encounter.  EXAM: CHEST  2 VIEW  COMPARISON:  Chest radiograph performed 12/23/2014  FINDINGS: The lungs are well-aerated. Peribronchial thickening is seen. There is no evidence of focal opacification, pleural effusion or pneumothorax.  The heart is borderline enlarged. No acute osseous abnormalities are seen.  IMPRESSION: Peribronchial thickening noted.  Borderline cardiomegaly.   Electronically Signed   By: Garald Balding M.D.   On: 01/07/2015 00:30   Dg Chest 2 View  12/23/2014   CLINICAL DATA:  Cough, fever and shortness of breath for 2 days.  EXAM: CHEST  2 VIEW  COMPARISON:  09/03/2014  FINDINGS: Cardiomegaly is unchanged. No pulmonary edema, consolidation, pleural effusion or pneumothorax. No acute osseous abnormality.  IMPRESSION: Stable cardiomegaly without localizing pulmonary process.   Electronically Signed   By: Jeb Levering M.D.   On: 12/23/2014 01:37    Medications  nitroGLYCERIN (NITROSTAT) SL tablet 0.4 mg (not administered)  heparin ADULT infusion 100 units/mL (25000 units/250 mL) (not administered)  alum & mag hydroxide-simeth (MAALOX/MYLANTA) 200-200-20 MG/5ML suspension 15 mL (15 mLs Oral Given 01/07/15 0039)  LORazepam (ATIVAN) tablet 0.5 mg (0.5 mg Oral Given 01/07/15 0142)    I personally performed the services described in this documentation, which was scribed in my presence. The recorded information has been reviewed and is accurate.    Veatrice Kells, MD 01/07/15 0353  Nelda Luckey, MD 01/07/15 [phone]

## 2015-01-07 ENCOUNTER — Encounter (HOSPITAL_COMMUNITY): Payer: Self-pay | Admitting: General Practice

## 2015-01-07 DIAGNOSIS — I452 Bifascicular block: Secondary | ICD-10-CM | POA: Diagnosis present

## 2015-01-07 DIAGNOSIS — F1010 Alcohol abuse, uncomplicated: Secondary | ICD-10-CM | POA: Diagnosis present

## 2015-01-07 DIAGNOSIS — I129 Hypertensive chronic kidney disease with stage 1 through stage 4 chronic kidney disease, or unspecified chronic kidney disease: Secondary | ICD-10-CM | POA: Diagnosis present

## 2015-01-07 DIAGNOSIS — I214 Non-ST elevation (NSTEMI) myocardial infarction: Secondary | ICD-10-CM | POA: Diagnosis present

## 2015-01-07 DIAGNOSIS — I249 Acute ischemic heart disease, unspecified: Secondary | ICD-10-CM | POA: Diagnosis present

## 2015-01-07 DIAGNOSIS — Z7982 Long term (current) use of aspirin: Secondary | ICD-10-CM | POA: Diagnosis not present

## 2015-01-07 DIAGNOSIS — E1122 Type 2 diabetes mellitus with diabetic chronic kidney disease: Secondary | ICD-10-CM | POA: Diagnosis present

## 2015-01-07 DIAGNOSIS — F1410 Cocaine abuse, uncomplicated: Secondary | ICD-10-CM | POA: Diagnosis present

## 2015-01-07 DIAGNOSIS — F17210 Nicotine dependence, cigarettes, uncomplicated: Secondary | ICD-10-CM | POA: Diagnosis present

## 2015-01-07 DIAGNOSIS — Z79899 Other long term (current) drug therapy: Secondary | ICD-10-CM | POA: Diagnosis not present

## 2015-01-07 DIAGNOSIS — I420 Dilated cardiomyopathy: Secondary | ICD-10-CM | POA: Diagnosis present

## 2015-01-07 DIAGNOSIS — I5023 Acute on chronic systolic (congestive) heart failure: Secondary | ICD-10-CM | POA: Diagnosis present

## 2015-01-07 DIAGNOSIS — T405X1A Poisoning by cocaine, accidental (unintentional), initial encounter: Secondary | ICD-10-CM | POA: Diagnosis present

## 2015-01-07 DIAGNOSIS — Z590 Homelessness: Secondary | ICD-10-CM | POA: Diagnosis not present

## 2015-01-07 DIAGNOSIS — N184 Chronic kidney disease, stage 4 (severe): Secondary | ICD-10-CM | POA: Diagnosis present

## 2015-01-07 DIAGNOSIS — Z7952 Long term (current) use of systemic steroids: Secondary | ICD-10-CM | POA: Diagnosis not present

## 2015-01-07 LAB — I-STAT TROPONIN, ED
Troponin i, poc: 0.54 ng/mL (ref 0.00–0.08)
Troponin i, poc: 2.09 ng/mL (ref 0.00–0.08)

## 2015-01-07 LAB — HEPARIN LEVEL (UNFRACTIONATED)
HEPARIN UNFRACTIONATED: 0.14 [IU]/mL — AB (ref 0.30–0.70)
Heparin Unfractionated: 0.33 IU/mL (ref 0.30–0.70)

## 2015-01-07 LAB — COMPREHENSIVE METABOLIC PANEL
ALT: 25 U/L (ref 17–63)
AST: 44 U/L — AB (ref 15–41)
Albumin: 3 g/dL — ABNORMAL LOW (ref 3.5–5.0)
Alkaline Phosphatase: 99 U/L (ref 38–126)
Anion gap: 7 (ref 5–15)
BUN: 17 mg/dL (ref 6–20)
CALCIUM: 8.9 mg/dL (ref 8.9–10.3)
CO2: 27 mmol/L (ref 22–32)
CREATININE: 1.73 mg/dL — AB (ref 0.61–1.24)
Chloride: 107 mmol/L (ref 101–111)
GFR calc non Af Amer: 41 mL/min — ABNORMAL LOW (ref >60)
GFR, EST AFRICAN AMERICAN: 48 mL/min — AB (ref >60)
GLUCOSE: 102 mg/dL — AB (ref 65–99)
Potassium: 4.1 mmol/L (ref 3.5–5.1)
Sodium: 141 mmol/L (ref 135–145)
Total Bilirubin: 1.5 mg/dL — ABNORMAL HIGH (ref 0.3–1.2)
Total Protein: 6.3 g/dL — ABNORMAL LOW (ref 6.5–8.1)

## 2015-01-07 LAB — CBC WITH DIFFERENTIAL/PLATELET
BASOS PCT: 1 % (ref 0–1)
Basophils Absolute: 0.1 10*3/uL (ref 0.0–0.1)
Eosinophils Absolute: 0.1 10*3/uL (ref 0.0–0.7)
Eosinophils Relative: 1 % (ref 0–5)
HEMATOCRIT: 42.2 % (ref 39.0–52.0)
Hemoglobin: 13.9 g/dL (ref 13.0–17.0)
LYMPHS PCT: 34 % (ref 12–46)
Lymphs Abs: 1.6 10*3/uL (ref 0.7–4.0)
MCH: 33.1 pg (ref 26.0–34.0)
MCHC: 32.9 g/dL (ref 30.0–36.0)
MCV: 100.5 fL — AB (ref 78.0–100.0)
MONO ABS: 0.3 10*3/uL (ref 0.1–1.0)
Monocytes Relative: 6 % (ref 3–12)
Neutro Abs: 2.7 10*3/uL (ref 1.7–7.7)
Neutrophils Relative %: 58 % (ref 43–77)
Platelets: 263 10*3/uL (ref 150–400)
RBC: 4.2 MIL/uL — ABNORMAL LOW (ref 4.22–5.81)
RDW: 13.1 % (ref 11.5–15.5)
WBC: 4.7 10*3/uL (ref 4.0–10.5)

## 2015-01-07 LAB — RAPID URINE DRUG SCREEN, HOSP PERFORMED
AMPHETAMINES: NOT DETECTED
BARBITURATES: NOT DETECTED
Benzodiazepines: NOT DETECTED
Cocaine: POSITIVE — AB
OPIATES: NOT DETECTED
TETRAHYDROCANNABINOL: NOT DETECTED

## 2015-01-07 LAB — TROPONIN I
TROPONIN I: 4.89 ng/mL — AB (ref <0.031)
Troponin I: 3.09 ng/mL (ref <0.031)
Troponin I: 4.19 ng/mL (ref <0.031)

## 2015-01-07 LAB — DIGOXIN LEVEL: Digoxin Level: 0.5 ng/mL — ABNORMAL LOW (ref 0.8–2.0)

## 2015-01-07 LAB — BRAIN NATRIURETIC PEPTIDE: B NATRIURETIC PEPTIDE 5: 4399.7 pg/mL — AB (ref 0.0–100.0)

## 2015-01-07 MED ORDER — ASPIRIN EC 81 MG PO TBEC
81.0000 mg | DELAYED_RELEASE_TABLET | Freq: Every day | ORAL | Status: DC
  Administered 2015-01-07 – 2015-01-09 (×3): 81 mg via ORAL
  Filled 2015-01-07 (×3): qty 1

## 2015-01-07 MED ORDER — ALUM & MAG HYDROXIDE-SIMETH 200-200-20 MG/5ML PO SUSP
30.0000 mL | Freq: Once | ORAL | Status: AC
  Administered 2015-01-07: 30 mL via ORAL
  Filled 2015-01-07: qty 30

## 2015-01-07 MED ORDER — ONDANSETRON HCL 4 MG/2ML IJ SOLN: 4.0000 mg | Freq: Four times a day (QID) | INTRAMUSCULAR | Status: DC | PRN

## 2015-01-07 MED ORDER — HEPARIN (PORCINE) IN NACL 100-0.45 UNIT/ML-% IJ SOLN
1200.0000 [IU]/h | INTRAMUSCULAR | Status: DC
  Administered 2015-01-07: 850 [IU]/h via INTRAVENOUS
  Filled 2015-01-07 (×2): qty 250

## 2015-01-07 MED ORDER — NITROGLYCERIN 0.4 MG SL SUBL
0.4000 mg | SUBLINGUAL_TABLET | SUBLINGUAL | Status: DC | PRN
  Administered 2015-01-08: 0.4 mg via SUBLINGUAL

## 2015-01-07 MED ORDER — DIGOXIN 250 MCG PO TABS
0.2500 mg | ORAL_TABLET | Freq: Every day | ORAL | Status: DC
  Administered 2015-01-07: 0.25 mg via ORAL
  Filled 2015-01-07: qty 1

## 2015-01-07 MED ORDER — LORAZEPAM 1 MG PO TABS
0.5000 mg | ORAL_TABLET | Freq: Once | ORAL | Status: AC
  Administered 2015-01-07: 0.5 mg via ORAL
  Filled 2015-01-07: qty 1

## 2015-01-07 MED ORDER — RAMIPRIL 1.25 MG PO CAPS
1.2500 mg | ORAL_CAPSULE | Freq: Every day | ORAL | Status: DC
  Administered 2015-01-07 – 2015-01-09 (×3): 1.25 mg via ORAL
  Filled 2015-01-07 (×3): qty 1

## 2015-01-07 MED ORDER — HEPARIN BOLUS VIA INFUSION
4000.0000 [IU] | Freq: Once | INTRAVENOUS | Status: AC
  Administered 2015-01-07: 4000 [IU] via INTRAVENOUS
  Filled 2015-01-07: qty 4000

## 2015-01-07 MED ORDER — FUROSEMIDE 40 MG PO TABS
40.0000 mg | ORAL_TABLET | Freq: Two times a day (BID) | ORAL | Status: DC
  Administered 2015-01-07 – 2015-01-09 (×5): 40 mg via ORAL
  Filled 2015-01-07 (×5): qty 1

## 2015-01-07 MED ORDER — NITROGLYCERIN 2 % TD OINT
0.5000 [in_us] | TOPICAL_OINTMENT | Freq: Four times a day (QID) | TRANSDERMAL | Status: DC
  Administered 2015-01-07 – 2015-01-09 (×8): 0.5 [in_us] via TOPICAL
  Filled 2015-01-07: qty 30

## 2015-01-07 MED ORDER — ASPIRIN 81 MG PO CHEW
324.0000 mg | CHEWABLE_TABLET | ORAL | Status: AC
  Administered 2015-01-07: 324 mg via ORAL
  Filled 2015-01-07: qty 4

## 2015-01-07 MED ORDER — ASPIRIN 300 MG RE SUPP: 300.0000 mg | RECTAL | Status: AC

## 2015-01-07 MED ORDER — ACETAMINOPHEN 325 MG PO TABS
650.0000 mg | ORAL_TABLET | ORAL | Status: DC | PRN
  Administered 2015-01-08: 650 mg via ORAL
  Filled 2015-01-07: qty 2

## 2015-01-07 MED ORDER — NITROGLYCERIN 0.4 MG SL SUBL: 0.4000 mg | SUBLINGUAL_TABLET | SUBLINGUAL | Status: DC | PRN

## 2015-01-07 MED ORDER — CARVEDILOL 3.125 MG PO TABS
3.1250 mg | ORAL_TABLET | Freq: Two times a day (BID) | ORAL | Status: DC
  Administered 2015-01-07 – 2015-01-09 (×5): 3.125 mg via ORAL
  Filled 2015-01-07 (×5): qty 1

## 2015-01-07 MED ORDER — ASPIRIN EC 81 MG PO TBEC: 81.0000 mg | DELAYED_RELEASE_TABLET | Freq: Every day | ORAL | Status: DC

## 2015-01-07 MED ORDER — HEPARIN BOLUS VIA INFUSION
2000.0000 [IU] | Freq: Once | INTRAVENOUS | Status: AC
  Administered 2015-01-07: 2000 [IU] via INTRAVENOUS
  Filled 2015-01-07: qty 2000

## 2015-01-07 MED ORDER — ATORVASTATIN CALCIUM 20 MG PO TABS
20.0000 mg | ORAL_TABLET | Freq: Every day | ORAL | Status: DC
  Administered 2015-01-07 – 2015-01-09 (×3): 20 mg via ORAL
  Filled 2015-01-07 (×3): qty 1

## 2015-01-07 MED ORDER — SODIUM CHLORIDE 0.9 % IV SOLN
INTRAVENOUS | Status: DC
  Administered 2015-01-07: 11:00:00 via INTRAVENOUS

## 2015-01-07 NOTE — ED Notes (Signed)
Dr Randal Buba given a copy of troponin results 2.09

## 2015-01-07 NOTE — Progress Notes (Signed)
ANTICOAGULATION CONSULT NOTE - Initial Consult  Pharmacy Consult for Heparin  Indication: chest pain/ACS  No Known Allergies  Patient Measurements: Height: 5\' 8"  (172.7 cm) Weight: 154 lb (69.854 kg) IBW/kg (Calculated) : 68.4  Vital Signs: BP: 110/78 mmHg (06/22 0530) Pulse Rate: 78 (06/22 0530)  Labs:  Recent Labs  01/06/15 2320 01/06/15 2327  HGB 13.1 13.9  HCT 39.4 41.0  PLT 267  --   CREATININE  --  1.80*    Estimated Creatinine Clearance: 42.2 mL/min (by C-G formula based on Cr of 1.8).   Medical History: Past Medical History  Diagnosis Date  . Hypertension   . Homelessness     Assessment: Heparin per pharmacy for chest pain. CBC good, noted renal dysfunction, troponin elevated, other labs as above.   Goal of Therapy:  Heparin level 0.3-0.7 units/ml Monitor platelets by anticoagulation protocol: Yes   Plan:  -Heparin 4000 units BOLUS -Start heparin drip at 850 units/hr -1500 HL -Daily CBC/HL -Monitor for bleeding  Narda Bonds 01/07/2015,5:57 AM

## 2015-01-07 NOTE — Progress Notes (Signed)
ANTICOAGULATION CONSULT NOTE - Follow Up Consult  Pharmacy Consult for Heparin Indication: chest pain/ACS  No Known Allergies  Patient Measurements: Height: 5\' 8"  (172.7 cm) Weight: 151 lb (68.493 kg) IBW/kg (Calculated) : 68.4 Heparin Dosing Weight: 68.5 kg  Vital Signs: Temp: 97.5 F (36.4 C) (06/22 1333) Temp Source: Oral (06/22 1333) BP: 119/91 mmHg (06/22 1333) Pulse Rate: 80 (06/22 1333)  Labs:  Recent Labs  01/06/15 2320 01/06/15 2327 01/07/15 1105 01/07/15 1437  HGB 13.1 13.9 13.9  --   HCT 39.4 41.0 42.2  --   PLT 267  --  263  --   HEPARINUNFRC  --   --   --  0.14*  CREATININE  --  1.80* 1.73*  --   TROPONINI  --   --  3.09*  --     Estimated Creatinine Clearance: 43.9 mL/min (by C-G formula based on Cr of 1.73).  Assessment:  Assessment: CP, SOB, palpitations with cocaine abuse. Ran out of medications Troponin elevated. Recent hospital discharge.  PMH: NICM, ACS with cocaine abuse, LV dysfunction with EF 20-25%, HTN, DM, polysubstance abuse, CKD IV, homelessness  Anticoagulation: ACS with cocaine. Patient would like to be treated medically. Heparin level 0.14 less than goal. CBC WNL   Goal of Therapy:  Heparin level 0.3-0.7 units/ml Monitor platelets by anticoagulation protocol: Yes   Plan:  Heparin 2000 unit IV bolus Increase heparin infusion to 1000 units/hr Recheck HL in 6-8 hrs and daily   Sloan Takagi S. Alford Highland, PharmD, BCPS Clinical Staff Pharmacist Pager [phone]  Eilene Ghazi Stillinger 01/07/2015,3:58 PM

## 2015-01-07 NOTE — ED Notes (Signed)
Dr. Terrence Dupont repaged to [phone] @0548 .

## 2015-01-07 NOTE — ED Notes (Signed)
Dr. Terrence Dupont paged to [phone]@0102 .

## 2015-01-07 NOTE — Progress Notes (Signed)
CRITICAL VALUE ALERT  Critical value received:  Troponin 3.09  Date of notification:  01/07/2015  Time of notification:  [id number]  Critical value read back:Yes.    Nurse who received alert:  Edward Qualia RN  MD notified (1st page):  Harwani  Time of first page:  [phone]  MD notified (2nd page):  Time of second page:  Responding MD:  Terrence Dupont  Time MD responded:  [id number]

## 2015-01-07 NOTE — Progress Notes (Signed)
UR Completed Joyia Riehle Graves-Bigelow, RN,BSN [phone]  

## 2015-01-07 NOTE — H&P (Signed)
[name] is an 59 y.o. male.   Chief Complaint: Chest pain in the setting of cocaine abuse with mildly elevated troponin I HPI: Patient is 59 year old male with past medical history significant for nonischemic dilated cardiomyopathy history of acute coronary syndrome in the setting of cocaine abuse noted to have severe Lee depressed LV systolic function EF approximately 20-25% with patent coronary arteries and recent past, hypertension, non-insulin-dependent diabetes mellitus controlled by diet, history of tobacco abuse, cocaine abuse, EtOH abuse, chronic kidney disease stage IV, recently discharged from the hospital and came to the ER by EMS complaining of retrosternal chest pain associated with shortness of breath and palpitation off and on since yesterday. Patient states he ran out of all his medication and has been using cocaine for last 2 days. Patient was noted to have mildly elevated troponin I EKG done in the ER showed normal sinus rhythm with left anterior fascicular block and right bundle branch block no new acute ischemic changes were noted. Patient presently denies any chest pain.   Past Medical History  Diagnosis Date  . Hypertension   . Homelessness     Past Surgical History  Procedure Laterality Date  . Left heart catheterization with coronary angiogram N/A 08/15/2014    Procedure: LEFT HEART CATHETERIZATION WITH CORONARY ANGIOGRAM;  Surgeon: Clent Demark, MD;  Location: [facility] CATH LAB;  Service: Cardiovascular;  Laterality: N/A;    History reviewed. No pertinent family history. Social History:  reports that he quit smoking about 10 years ago. He does not have any smokeless tobacco history on file. He reports that he drinks about 8.4 oz of alcohol per week. He reports that he uses illicit drugs (Cocaine).  Allergies: No Known Allergies  Medications Prior to Admission  Medication Sig Dispense Refill  . aspirin EC 81 MG tablet Take 81 mg by mouth daily.    Marland Kitchen atorvastatin  (LIPITOR) 20 MG tablet Take 1 tablet (20 mg total) by mouth daily. 30 tablet 3  . carvedilol (COREG) 6.25 MG tablet Take 1 tablet (6.25 mg total) by mouth 2 (two) times daily with a meal. 60 tablet 3  . digoxin (LANOXIN) 0.25 MG tablet Take 1 tablet (0.25 mg total) by mouth daily. 30 tablet 3  . furosemide (LASIX) 40 MG tablet Take 1 tablet (40 mg total) by mouth 2 (two) times daily. 60 tablet 3  . nitroGLYCERIN (NITROSTAT) 0.4 MG SL tablet Place 1 tablet (0.4 mg total) under the tongue every 5 (five) minutes x 3 doses as needed for chest pain. 25 tablet 1  . ramipril (ALTACE) 1.25 MG capsule Take 1 capsule (1.25 mg total) by mouth daily. 30 capsule 1  . predniSONE (DELTASONE) 20 MG tablet Take 3 tablets (60 mg total) by mouth daily. (Patient not taking: Reported on 01/07/2015) 12 tablet 0    Results for orders placed or performed during the hospital encounter of 01/06/15 (from the past 48 hour(s))  CBC with Differential/Platelet     Status: Abnormal   Collection Time: 01/06/15 11:20 PM  Result Value Ref Range   WBC 4.4 4.0 - 10.5 K/uL   RBC 3.99 (L) 4.22 - 5.81 MIL/uL   Hemoglobin 13.1 13.0 - 17.0 g/dL   HCT 39.4 39.0 - 52.0 %   MCV 98.7 78.0 - 100.0 fL   MCH 32.8 26.0 - 34.0 pg   MCHC 33.2 30.0 - 36.0 g/dL   RDW 12.7 11.5 - 15.5 %   Platelets 267 150 - 400 K/uL   Neutrophils  Relative % 48 43 - 77 %   Neutro Abs 2.1 1.7 - 7.7 K/uL   Lymphocytes Relative 41 12 - 46 %   Lymphs Abs 1.8 0.7 - 4.0 K/uL   Monocytes Relative 9 3 - 12 %   Monocytes Absolute 0.4 0.1 - 1.0 K/uL   Eosinophils Relative 1 0 - 5 %   Eosinophils Absolute 0.0 0.0 - 0.7 K/uL   Basophils Relative 1 0 - 1 %   Basophils Absolute 0.0 0.0 - 0.1 K/uL  Digoxin level     Status: Abnormal   Collection Time: 01/06/15 11:20 PM  Result Value Ref Range   Digoxin Level 0.3 (L) 0.8 - 2.0 ng/mL  Brain natriuretic peptide     Status: Abnormal   Collection Time: 01/06/15 11:20 PM  Result Value Ref Range   B Natriuretic Peptide  4399.7 (H) 0.0 - 100.0 pg/mL  I-stat troponin, ED     Status: Abnormal   Collection Time: 01/06/15 11:26 PM  Result Value Ref Range   Troponin i, poc 0.18 (HH) 0.00 - 0.08 ng/mL   Comment NOTIFIED PHYSICIAN    Comment 3            Comment: Due to the release kinetics of cTnI, a negative result within the first hours of the onset of symptoms does not rule out myocardial infarction with certainty. If myocardial infarction is still suspected, repeat the test at appropriate intervals.   I-stat chem 8, ed     Status: Abnormal   Collection Time: 01/06/15 11:27 PM  Result Value Ref Range   Sodium 140 135 - 145 mmol/L   Potassium 3.8 3.5 - 5.1 mmol/L   Chloride 107 101 - 111 mmol/L   BUN 18 6 - 20 mg/dL   Creatinine, Ser 1.80 (H) 0.61 - 1.24 mg/dL   Glucose, Bld 112 (H) 65 - 99 mg/dL   Calcium, Ion 1.16 1.13 - 1.30 mmol/L   TCO2 17 0 - 100 mmol/L   Hemoglobin 13.9 13.0 - 17.0 g/dL   HCT 41.0 39.0 - 52.0 %  Urine rapid drug screen (hosp performed)     Status: Abnormal   Collection Time: 01/06/15 11:35 PM  Result Value Ref Range   Opiates NONE DETECTED NONE DETECTED   Cocaine POSITIVE (A) NONE DETECTED   Benzodiazepines NONE DETECTED NONE DETECTED   Amphetamines NONE DETECTED NONE DETECTED   Tetrahydrocannabinol NONE DETECTED NONE DETECTED   Barbiturates NONE DETECTED NONE DETECTED    Comment:        DRUG SCREEN FOR MEDICAL PURPOSES ONLY.  IF CONFIRMATION IS NEEDED FOR ANY PURPOSE, NOTIFY LAB WITHIN 5 DAYS.        LOWEST DETECTABLE LIMITS FOR URINE DRUG SCREEN Drug Class       Cutoff (ng/mL) Amphetamine      1000 Barbiturate      200 Benzodiazepine   [id number] Tricyclics       [id number] Opiates          300 Cocaine          300 THC              50   I-stat troponin, ED     Status: Abnormal   Collection Time: 01/07/15  2:52 AM  Result Value Ref Range   Troponin i, poc 0.54 (HH) 0.00 - 0.08 ng/mL   Comment NOTIFIED PHYSICIAN    Comment 3            Comment: Due to the  release  kinetics of cTnI, a negative result within the first hours of the onset of symptoms does not rule out myocardial infarction with certainty. If myocardial infarction is still suspected, repeat the test at appropriate intervals.   I-stat troponin, ED     Status: Abnormal   Collection Time: 01/07/15  5:26 AM  Result Value Ref Range   Troponin i, poc 2.09 (HH) 0.00 - 0.08 ng/mL   Comment NOTIFIED PHYSICIAN    Comment 3            Comment: Due to the release kinetics of cTnI, a negative result within the first hours of the onset of symptoms does not rule out myocardial infarction with certainty. If myocardial infarction is still suspected, repeat the test at appropriate intervals.    Dg Chest 2 View  01/07/2015   CLINICAL DATA:  Acute onset of shortness of breath, generalized chest pain, vomiting and diarrhea. Initial encounter.  EXAM: CHEST  2 VIEW  COMPARISON:  Chest radiograph performed 12/23/2014  FINDINGS: The lungs are well-aerated. Peribronchial thickening is seen. There is no evidence of focal opacification, pleural effusion or pneumothorax.  The heart is borderline enlarged. No acute osseous abnormalities are seen.  IMPRESSION: Peribronchial thickening noted.  Borderline cardiomegaly.   Electronically Signed   By: Garald Balding M.D.   On: 01/07/2015 00:30    Review of Systems  Constitutional: Negative for fever and chills.  Eyes: Negative for double vision.  Cardiovascular: Positive for palpitations and leg swelling.  Gastrointestinal: Positive for vomiting.  Genitourinary: Negative for dysuria.  Neurological: Negative for dizziness, tingling and headaches.    Blood pressure 133/97, pulse 86, temperature 98.2 F (36.8 C), temperature source Oral, resp. rate 22, height 5\' 8"  (1.727 m), weight 68.493 kg (151 lb), SpO2 100 %. Physical Exam  Constitutional: He is oriented to person, place, and time.  HENT:  Head: Normocephalic and atraumatic.  Eyes: Conjunctivae are normal.  Left eye exhibits no discharge.  Neck: Normal range of motion. Neck supple. JVD present. No tracheal deviation present. No thyromegaly present.  Cardiovascular: Regular rhythm.   Murmur ( soft systolic murmur and S3 gallop noted) heard. Respiratory:  Decreased breath sound at bases  GI: Soft. Bowel sounds are normal. He exhibits no distension. There is no tenderness. There is no rebound.  Musculoskeletal: He exhibits no edema or tenderness.  Neurological: He is alert and oriented to person, place, and time.     Assessment/Plan Acute coronary syndrome in the setting of cocaine abuse Severe nonischemic dilated cardiomyopathy Acute decompensated systolic heart failure Hypertension Diabetes mellitus controlled by diet Chronic kidney disease stage IV Cocaine abuse Tobacco abuse History of alcohol abuse  Plan As per orders Discussed with patient various options of treatment i.e. medical versus invasive left cath this risk and benefits states he wanted to be treated medically. Discussed with patient regarding lifestyle changes and compliance with medication and reframing from cocaine abuse.   Charolette Forward 01/07/2015, 7:44 AM

## 2015-01-07 NOTE — Progress Notes (Signed)
ANTICOAGULATION CONSULT NOTE - Follow Up Consult  Pharmacy Consult for heparin Indication: ACS in setting of cocaine abuse   Labs:  Recent Labs  01/06/15 2320 01/06/15 2327 01/07/15 1105 01/07/15 1437 01/07/15 2043 01/07/15 2303  HGB 13.1 13.9 13.9  --   --   --   HCT 39.4 41.0 42.2  --   --   --   PLT 267  --  263  --   --   --   HEPARINUNFRC  --   --   --  0.14*  --  0.33  CREATININE  --  1.80* 1.73*  --   --   --   TROPONINI  --   --  3.09* 4.19* 4.89*  --       Assessment/Plan:  60yo male therapeutic on heparin after rate change. Will continue gtt at current rate and confirm stable with am labs.   Wynona Neat, PharmD, BCPS  01/07/2015,11:39 PM

## 2015-01-07 NOTE — Progress Notes (Signed)
CRITICAL VALUE ALERT  Critical value received:  Troponin 4.10  Date of notification:  01/07/2015  Time of notification:  [id number]  Critical value read back:Yes.    Nurse who received alert:  Edward Qualia RN  MD notified (1st page):  Harwani  Time of first page:  [phone]  MD notified (2nd page):  Time of second page:  Responding MD:  Terrence Dupont  Time MD responded:

## 2015-01-08 ENCOUNTER — Inpatient Hospital Stay (HOSPITAL_COMMUNITY): Payer: Medicaid Other

## 2015-01-08 LAB — CBC
HEMATOCRIT: 41.6 % (ref 39.0–52.0)
Hemoglobin: 13.7 g/dL (ref 13.0–17.0)
MCH: 32.8 pg (ref 26.0–34.0)
MCHC: 32.9 g/dL (ref 30.0–36.0)
MCV: 99.5 fL (ref 78.0–100.0)
Platelets: 273 10*3/uL (ref 150–400)
RBC: 4.18 MIL/uL — ABNORMAL LOW (ref 4.22–5.81)
RDW: 13 % (ref 11.5–15.5)
WBC: 4.2 10*3/uL (ref 4.0–10.5)

## 2015-01-08 LAB — TROPONIN I: TROPONIN I: 4.72 ng/mL — AB (ref <0.031)

## 2015-01-08 LAB — HEPARIN LEVEL (UNFRACTIONATED): HEPARIN UNFRACTIONATED: 0.19 [IU]/mL — AB (ref 0.30–0.70)

## 2015-01-08 MED ORDER — GI COCKTAIL ~~LOC~~
30.0000 mL | Freq: Once | ORAL | Status: AC
Start: 2015-01-08 — End: 2015-01-08
  Administered 2015-01-08: 30 mL via ORAL
  Filled 2015-01-08: qty 30

## 2015-01-08 MED ORDER — CLOPIDOGREL BISULFATE 75 MG PO TABS
75.0000 mg | ORAL_TABLET | Freq: Every day | ORAL | Status: DC
  Administered 2015-01-08 – 2015-01-09 (×2): 75 mg via ORAL
  Filled 2015-01-08 (×2): qty 1

## 2015-01-08 MED ORDER — HEPARIN SODIUM (PORCINE) 5000 UNIT/ML IJ SOLN
5000.0000 [IU] | Freq: Three times a day (TID) | INTRAMUSCULAR | Status: DC
  Administered 2015-01-08 – 2015-01-09 (×3): 5000 [IU] via SUBCUTANEOUS
  Filled 2015-01-08 (×3): qty 1

## 2015-01-08 NOTE — Progress Notes (Signed)
CSW (Clinical Education officer, museum) received consult. Consult more appropriate for RNCM. RNCM aware of pt needs. Please reconsult if social work needs arise.  [place], [place]

## 2015-01-08 NOTE — Progress Notes (Signed)
ANTICOAGULATION CONSULT NOTE - Follow Up Consult  Pharmacy Consult for heparin Indication: ACS in setting of cocaine abuse   Labs:  Recent Labs  01/06/15 2320 01/06/15 2327 01/07/15 1105 01/07/15 1437 01/07/15 2043 01/07/15 2303 01/08/15 0501  HGB 13.1 13.9 13.9  --   --   --  13.7  HCT 39.4 41.0 42.2  --   --   --  41.6  PLT 267  --  263  --   --   --  273  HEPARINUNFRC  --   --   --  0.14*  --  0.33 0.19*  CREATININE  --  1.80* 1.73*  --   --   --   --   TROPONINI  --   --  3.09* 4.19* 4.89*  --   --     Assessment: 59yo male now subtherapeutic on heparin after one level at low end of goal, level likely d/t bolus.  Goal of Therapy:  Heparin level 0.3-0.7 units/ml   Plan:  Will increase heparin gtt by 3 units/kg/hr to 1200 units/hr and check level in [place], PharmD, BCPS  01/08/2015,6:55 AM

## 2015-01-08 NOTE — Care Management Note (Addendum)
Case Management Note  Patient Details  Name: [name] MRN: [id number] Date of Birth: Nov 25, 1955  Subjective/Objective:     Pt admitted for cp positive enzymes.  Pt has opted not to do cath and will do medical management. Pt has insurance Medicaid-active. Per pt he has been utilizing [facility] [name] as PCP. CM will set pt up with the [facility] for PCP needs. Pt is aware that Pharmacy is on site. Per pt he is homeless @ times when he is not able to stay with his friend. CSW to assist with providing Shelter List and Medicaid Transportation List. Contact Number for pt will be his brother [name] @ [phone].      Action/Plan:  CM will not be able to assist with medications at this time due to pt having insurance. Per pt he was getting medications at [facility] and having his brother assist with payment. CM did call [facility] and he is eligible to be followed up outpatient. Hospital f/u set up for : Wednesday June 29 at 2:30 pm.   No further needs from CM at this time.   01-09-15 CM did call the Transitional Care Case Manager [name] and she will see if pt is candidate for Transitional Care. No further needs from CM @ this time.   Expected Discharge Date:                  Expected Discharge Plan:  Home/Self Care  In-House Referral:  Clinical Social Worker  Discharge [facility] Follow Up Established, Indigent Clinic and Medication Assistance.   Post Acute Care Choice:  NA Choice offered to:  NA  DME Arranged:  N/A DME Agency:  NA  HH Arranged:  NA HH Agency:  NA  Status of Service:  Completed, signed off  Medicare Important Message Given:  No Date Medicare IM Given:    Medicare IM give by:    Date Additional Medicare IM Given:    Additional Medicare Important Message give by:     If discussed at [place] of Stay Meetings, dates discussed:    Additional Comments:  Bethena Roys,  RN 01/08/2015, 11:04 AM

## 2015-01-08 NOTE — Progress Notes (Signed)
Patient c/o aching pain to mid upper chest. Pain level 7/10. Nitro 0.4mg  SL given with relief noted. No c/o shob, diaphoresis, or n/v noted. Will continue to monitor. VSS. [facility] [employer]

## 2015-01-08 NOTE — Clinical Social Work Note (Signed)
Clinical Social Work Assessment  Patient Details  Name: [name] MRN: [id number] Date of Birth: [birth year]  Date of referral:  01/08/15               Reason for consult:  Substance Use/ETOH Abuse                 Housing/Transportation Living arrangements for the past 2 months:  No permanent address Source of Information:  Patient, Medical Team Patient Interpreter Needed:  None Criminal Activity/Legal Involvement Pertinent to Current Situation/Hospitalization:  No - Comment as needed Significant Relationships:  Friend Lives with:  Self Do you feel safe going back to the place where you live?  Yes Need for family participation in patient care:  No (Coment)  Care giving concerns:  CSW asked to speak with pt about multiple issues including Substance Abuse-Cocaine, Homelessness, Transportation Issues (follow-up medical appointments)   Social Worker assessment / plan:  CSW visited pt room and discussed above concerns with pt. Pt reports he was brought into the hospital because he was out in the sun for too long. Pt denies cocaine or any other substance use. Pt declined to speak any further about this, but was agreeable to accepting resources for treatment programs. Pt reported to CSW that he is currently staying "here and there" with friends. Pt reports he is familiar with area shelters but also agreeable to accepting shelter list and [place] for meals. Pt stated that he usually walks, uses public transportation, or asks friends for a ride when he needs to go somewhere. CSW spoke with pt about Medicaid transportation. CSW explained to pt that he will need to call number provided on transportation handout and be deemed eligible first. Pt aware that he should do this as soon as possible. Pt reports that he should be able to have a friend pick him up at discharge. Pt has no further concerns and thanked CSW. CSW signing off.  Employment status:  Unemployed Forensic scientist:   Medicaid In [place] PT Recommendations:  Not assessed at this time Information / Referral to community resources:  Residential Substance Abuse Treatment Options, Outpatient Substance Abuse Treatment Options, Shelter, Other (Comment Required) ([employer] [facility] [employer] in [place]; Transport planner for Older Adults in [place])  Patient/Family's Response to care:  Pt accepting of CSW visit but not forthcoming with CSW about substance use.  Patient/Family's Understanding of and Emotional Response to Diagnosis, Current Treatment, and Prognosis: Pt understanding of medical condition unclear. CSW unable to assess if pt is understanding of link between substance use and current diagnosis since he is currently denying use.   Emotional Assessment Appearance:  Appears stated age, Well-Groomed Attitude/Demeanor/Rapport:  Other (Cooperative) Affect (typically observed):  Calm, Appropriate Orientation:  Oriented to Self, Oriented to Place, Oriented to  Time, Oriented to Situation Alcohol / Substance use:  Illicit Drugs (Pt denies use. SBIRT not completed) Psych involvement (Current and /or in the community):  No (Comment)  Discharge Needs  Concerns to be addressed:  Homelessness, Substance Abuse Concerns, Other (Comment Required (Transportation) Readmission within the last 30 days:  No Current discharge risk:  Substance Abuse, Homeless Barriers to Discharge:  Continued Medical Work up   [employer], [place]

## 2015-01-08 NOTE — Progress Notes (Signed)
Subjective:  Patient denies any further chest pains.  Discussed again with patient regarding left cardiac catheterization but wanted to be treated medically.   Objective:  Vital Signs in the last 24 hours: Temp:  [97.5 F (36.4 C)-98.9 F (37.2 C)] 98.9 F (37.2 C) (06/23 0618) Pulse Rate:  [80-93] 91 (06/23 0618) Resp:  [16] 16 (06/22 1333) BP: (108-119)/(78-91) 113/78 mmHg (06/23 0618) SpO2:  [99 %-100 %] 99 % (06/23 0618) Weight:  [68.629 kg (151 lb 4.8 oz)] 68.629 kg (151 lb 4.8 oz) (06/23 0618)  Intake/Output from previous day: 06/22 0701 - 06/23 0700 In: 520 [P.O.:520] Out: 1050 [Urine:1050] Intake/Output from this shift: Total I/O In: 240 [P.O.:240] Out: -   Physical Exam: Neck: no adenopathy, no carotid bruit, no JVD and supple, symmetrical, trachea midline Lungs: decreased breath sounds at bases Heart: regular rate and rhythm, S1, S2 normal and soft systolic murmur noted Abdomen: soft, non-tender; bowel sounds normal; no masses,  no organomegaly Extremities: extremities normal, atraumatic, no cyanosis or edema  Lab Results:  Recent Labs  01/07/15 1105 01/08/15 0501  WBC 4.7 4.2  HGB 13.9 13.7  PLT 263 273    Recent Labs  01/06/15 2327 01/07/15 1105  NA 140 141  K 3.8 4.1  CL 107 107  CO2  --  27  GLUCOSE 112* 102*  BUN 18 17  CREATININE 1.80* 1.73*    Recent Labs  01/07/15 2043 01/08/15 0800  TROPONINI 4.89* 4.72*   Hepatic Function Panel  Recent Labs  01/07/15 1105  PROT 6.3*  ALBUMIN 3.0*  AST 44*  ALT 25  ALKPHOS 99  BILITOT 1.5*   No results for input(s): CHOL in the last 72 hours. No results for input(s): PROTIME in the last 72 hours.  Imaging: Imaging results have been reviewed and Dg Chest 2 View  01/07/2015   CLINICAL DATA:  Acute onset of shortness of breath, generalized chest pain, vomiting and diarrhea. Initial encounter.  EXAM: CHEST  2 VIEW  COMPARISON:  Chest radiograph performed 12/23/2014  FINDINGS: The lungs are  well-aerated. Peribronchial thickening is seen. There is no evidence of focal opacification, pleural effusion or pneumothorax.  The heart is borderline enlarged. No acute osseous abnormalities are seen.  IMPRESSION: Peribronchial thickening noted.  Borderline cardiomegaly.   Electronically Signed   By: Garald Balding M.D.   On: 01/07/2015 00:30    Cardiac Studies:  Assessment/Plan:  Acute coronary syndrome in the setting of cocaine abuse Severe nonischemic dilated cardiomyopathy Acute decompensated systolic heart failure Hypertension Diabetes mellitus controlled by diet Chronic kidney disease stage IV Cocaine abuse Tobacco abuse History of alcohol abuse  Plan DC heparin. Start Plavix as per orders. Check labs in a.m.  LOS: 1 day    Charolette Forward 01/08/2015, 10:32 AM

## 2015-01-08 NOTE — Progress Notes (Signed)
*  PRELIMINARY RESULTS* Echocardiogram 2D Echocardiogram has been performed.  Leavy Cella 01/08/2015, 10:06 AM

## 2015-01-09 LAB — BASIC METABOLIC PANEL
ANION GAP: 9 (ref 5–15)
BUN: 21 mg/dL — ABNORMAL HIGH (ref 6–20)
CHLORIDE: 101 mmol/L (ref 101–111)
CO2: 27 mmol/L (ref 22–32)
Calcium: 9.2 mg/dL (ref 8.9–10.3)
Creatinine, Ser: 1.86 mg/dL — ABNORMAL HIGH (ref 0.61–1.24)
GFR calc Af Amer: 44 mL/min — ABNORMAL LOW (ref >60)
GFR calc non Af Amer: 38 mL/min — ABNORMAL LOW (ref >60)
Glucose, Bld: 92 mg/dL (ref 65–99)
POTASSIUM: 4.4 mmol/L (ref 3.5–5.1)
Sodium: 137 mmol/L (ref 135–145)

## 2015-01-09 LAB — CBC
HCT: 39 % (ref 39.0–52.0)
Hemoglobin: 12.9 g/dL — ABNORMAL LOW (ref 13.0–17.0)
MCH: 32.9 pg (ref 26.0–34.0)
MCHC: 33.1 g/dL (ref 30.0–36.0)
MCV: 99.5 fL (ref 78.0–100.0)
Platelets: 254 10*3/uL (ref 150–400)
RBC: 3.92 MIL/uL — ABNORMAL LOW (ref 4.22–5.81)
RDW: 13 % (ref 11.5–15.5)
WBC: 3.4 10*3/uL — ABNORMAL LOW (ref 4.0–10.5)

## 2015-01-09 LAB — TROPONIN I: TROPONIN I: 3.34 ng/mL — AB (ref <0.031)

## 2015-01-09 MED ORDER — ISOSORBIDE MONONITRATE ER 30 MG PO TB24: 30.0000 mg | ORAL_TABLET | Freq: Every day | ORAL | Status: DC

## 2015-01-09 MED ORDER — CLOPIDOGREL BISULFATE 75 MG PO TABS: 75.0000 mg | ORAL_TABLET | Freq: Every day | ORAL | Status: DC

## 2015-01-09 NOTE — Progress Notes (Signed)
Patient c/o aching and burning pain in mid upper chest at 2320. Tylenol and nitropaste schedule was given and Dr. Terrence Dupont was paged. He returned call with order for GI cocktail which was given at 2345. At 0005 patient states pain has subsided. Will continue to monitor. [facility] [employer]

## 2015-01-09 NOTE — Discharge Summary (Signed)
[name], [name]              ACCOUNT NO.:  [id number]  MEDICAL RECORD NO.:  [id number]  LOCATION:  3W21C                        FACILITY:  [place]  PHYSICIAN:  Allegra Lai. Terrence Dupont, M.D. DATE OF BIRTH:  Mar 15, 1956  DATE OF ADMISSION:  01/07/2015 DATE OF DISCHARGE:  01/09/2015                              DISCHARGE SUMMARY   ADMITTING DIAGNOSES: 1. Acute coronary syndrome in the setting of cocaine abuse. 2. Severe nonischemic dilated cardiomyopathy. 3. Acute decompensated systolic heart failure. 4. Hypertension. 5. Diabetes mellitus controlled by diet. 6. Chronic kidney disease, stage 4. 7. Cocaine abuse. 8. Tobacco abuse. 9. History of alcohol abuse.  DISCHARGE MEDICATIONS: 1. Clopidogrel 75 mg 1 tablet daily. 2. Isosorbide mononitrate 30 mg daily. 3. Aspirin 81 mg daily. 4. Atorvastatin 20 mg daily. 5. Digoxin 0.25 mg daily. 6. Furosemide 40 mg twice daily. 7. Nitrostat 0.4 mg sublingual use as directed. 8. Ramipril 1.25 mg 1 capsule daily.  The patient has been advised to stop prednisone.  DIET:  Low salt, low cholesterol.  ACTIVITY:  Increase activity slowly as tolerated.  CONDITION AT DISCHARGE:  Stable.  FOLLOWUP:  Follow up with me in 1 week.  The patient has been advised to refrain from alcohol, smoking, and cocaine abuse.  The patient also has appointment at [facility] on January 14, 2015.  BRIEF HISTORY AND HOSPITAL COURSE:  Mr. [name] is 59 year old male with past medical history significant for nonischemic dilated cardiomyopathy, history of acute coronary syndrome in the setting of cocaine abuse in the past noted to have severely depressed LV systolic function, EF approximately 20-25% with patent coronary arteries in recent past, hypertension, non-insulin-dependent diabetes mellitus controlled by diet, history of tobacco abuse, cocaine abuse, alcohol abuse, chronic kidney disease stage 4, recently discharged from the hospital.  He came to the ER by  EMS complaining of retrosternal chest pain associated with shortness of breath and palpitation off and on since yesterday.  The patient states he ran out of all his medications and has been using cocaine for last 2 days.  The patient was noted to have mildly elevated troponin I.  EKG done in the ER showed normal sinus rhythm with left anterior fascicular block and right bundle-branch block.  No new acute ischemic changes were noted.  When seen in the ED, the patient denies any chest pain.  PHYSICAL EXAMINATION:  VITAL SIGNS:  His blood pressure was 133/97, pulse 86, he was afebrile. HEENT:  Conjunctivae pink. NECK:  Supple.  Positive JVD. CARDIOVASCULAR:  S1, S2 was normal.  There was soft systolic murmur and S3 gallop. LUNGS:  He had decreased breath sounds at bases. ABDOMEN:  Soft.  Bowel sounds were present.  Nontender. EXTREMITIES:  There was no clubbing, cyanosis, or edema.  LABORATORY DATA:  His sodium was 139, potassium 4.7, BUN 23, creatinine 1.85.  His proBNP was 4399.  Troponin I was 0.18, 0.54, 2.09, 3.09, 4.19, 4.89, and then 4.72, and today is 3.34 which is trending down. Today, sodium is 137, potassium 4.4, BUN 21, creatinine 1.86. Hemoglobin is 12.9, hematocrit 39, white count of 4.4.  A 2D echo showed global hypokinesia with EF of 20-25%.  BRIEF HOSPITAL COURSE:  The  patient was admitted to step-down unit.  The patient was ruled in for non-Q-wave myocardial infarction in the setting of cocaine abuse.  Urine drug screen was positive for cocaine.  The patient states he was doing cocaine for last few days.  Denies any IVDA. The patient also states he ran out of his medication and is living with his girlfriend and occasionally in the car.  Discussed with the patient regarding repeat cardiac catheterization versus medical management, the patient refused for invasive treatment.  The patient did not have any further episodes of chest pain during the hospital stay.  The  patient is up, walking in the room and hallway without any problems.  We will maximize his ACE inhibitors and beta-blockers as blood pressure tolerates as outpatient.  The patient was also discussed at length regarding refraining from alcohol, tobacco, and cocaine abuse to which he agrees.  The patient will be followed up in my office in 1 week and also at [facility] as scheduled.     Allegra Lai. Terrence Dupont, M.D.     MNH/MEDQ  D:  01/09/2015  T:  01/09/2015  Job:  [id number]

## 2015-01-09 NOTE — Discharge Instructions (Signed)
Acute Coronary Syndrome °Acute coronary syndrome (ACS) is an urgent problem in which the blood and oxygen supply to the heart is critically deficient. ACS requires hospitalization because one or more coronary arteries may be blocked. °ACS represents a range of conditions including: °· Previous angina that is now unstable, lasts longer, happens at rest, or is more intense. °· A heart attack, with heart muscle cell injury and death. °There are three vital coronary arteries that supply the heart muscle with blood and oxygen so that it can pump blood effectively. If blockages to these arteries develop, blood flow to the heart muscle is reduced. If the heart does not get enough blood, angina may occur as the first warning sign. °SYMPTOMS  °· The most common signs of angina include: °¨ Tightness or squeezing in the chest. °¨ Feeling of heaviness on the chest. °¨ Discomfort in the arms, neck, back, or jaw. °¨ Shortness of breath and nausea. °¨ Cold, wet skin. °· Angina is usually brought on by physical effort or excitement which increase the oxygen needs of the heart. These states increase the blood flow needs of the heart beyond what can be delivered. °· Other symptoms that are not as common include: °¨ Fatigue °¨ Unexplained feelings of nervousness or anxiety °¨ Weakness °¨ Diarrhea °· Sometimes, you may not have noticed any symptoms at all but still suffered a cardiac injury. °TREATMENT  °· Medicines to help discomfort may include nitroglycerin (nitro) in the form of tablets or a spray for rapid relief, or longer-acting forms such as cream, patches, or capsules. (Be aware that there are many side effects and possible interactions with other drugs). °· Other medicines may be used to help the heart pump better. °· Procedures to open blocked arteries including angioplasty or stent placement to keep the arteries open. °· Open heart surgery may be needed when there are many blockages or they are in critical locations that  are best treated with surgery. °HOME CARE INSTRUCTIONS  °· Do not use any tobacco products including cigarettes, chewing tobacco, or electronic cigarettes. °· Take one baby or adult aspirin daily, if your health care provider advises. This helps reduce the risk of a heart attack. °· It is very important that you follow the angina treatment prescribed by your health care provider. Make arrangements for proper follow-up care. °· Eat a heart healthy diet with salt and fat restrictions as advised. °· Regular exercise is good for you as long as it does not cause discomfort. Do not begin any new type of exercise until you check with your health care provider. °· If you are overweight, you should lose weight. °· Try to maintain normal blood lipid levels. °· Keep your blood pressure under control as recommended by your health care provider. °· You should tell your health care provider right away about any increase in the severity or frequency of your chest discomfort or angina attacks. When you have angina, you should stop what you are doing and sit down. This may bring relief in 3 to 5 minutes. If your health care provider has prescribed nitro, take it as directed. °· If your health care provider has given you a follow-up appointment, it is very important to keep that appointment. Not keeping the appointment could result in a chronic or permanent injury, pain, and disability. If there is any problem keeping the appointment, you must call back to this facility for assistance. °SEEK IMMEDIATE MEDICAL CARE IF:  °· You develop nausea, vomiting, or shortness   of breath.  You feel faint, lightheaded, or pass out.  Your chest discomfort gets worse.  You are sweating or experience sudden profound fatigue.  You do not get relief of your chest pain after 3 doses of nitro.  Your discomfort lasts longer than 15 minutes. MAKE SURE YOU:   Understand these instructions.  Will watch your condition.  Will get help right  away if you are not doing well or get worse.  Take all medicines as directed by your health care provider. Document Released: 07/04/2005 Document Revised: 07/09/2013 Document Reviewed: 11/05/2013 [facility] Patient Information 2015 [place], [place]. This information is not intended to replace advice given to you by your health care provider. Make sure you discuss any questions you have with your health care provider.  Cardiac Diet This diet can help prevent heart disease and stroke. Many factors influence your heart health, including eating and exercise habits. Coronary risk rises a lot with abnormal blood fat (lipid) levels. Cardiac meal planning includes limiting unhealthy fats, increasing healthy fats, and making other small dietary changes. General guidelines are as follows:  Adjust calorie intake to reach and maintain desirable body weight.  Limit total fat intake to less than 30% of total calories. Saturated fat should be less than 7% of calories.  Saturated fats are found in animal products and in some vegetable products. Saturated vegetable fats are found in coconut oil, cocoa butter, palm oil, and palm kernel oil. Read labels carefully to avoid these products as much as possible. Use butter in moderation. Choose tub margarines and oils that have 2 grams of fat or less. Good cooking oils are canola and olive oils.  Practice low-fat cooking techniques. Do not fry food. Instead, broil, bake, boil, steam, grill, roast on a rack, stir-fry, or microwave it. Other fat reducing suggestions include:  Remove the skin from poultry.  Remove all visible fat from meats.  Skim the fat off stews, soups, and gravies before serving them.  Steam vegetables in water or broth instead of sauting them in fat.  Avoid foods with trans fat (or hydrogenated oils), such as commercially fried foods and commercially baked goods. Commercial shortening and deep-frying fats will contain trans fat.  Increase intake  of fruits, vegetables, whole grains, and legumes to replace foods high in fat.  Increase consumption of nuts, legumes, and seeds to at least 4 servings weekly. One serving of a legume equals  cup, and 1 serving of nuts or seeds equals  cup.  Choose whole grains more often. Have 3 servings per day (a serving is 1 ounce [oz]).  Eat 4 to 5 servings of vegetables per day. A serving of vegetables is 1 cup of raw leafy vegetables;  cup of raw or cooked cut-up vegetables;  cup of vegetable juice.  Eat 4 to 5 servings of fruit per day. A serving of fruit is 1 medium whole fruit;  cup of dried fruit;  cup of fresh, frozen, or canned fruit;  cup of 100% fruit juice.  Increase your intake of dietary fiber to 20 to 30 grams per day. Insoluble fiber may help lower your risk of heart disease and may help curb your appetite.  Soluble fiber binds cholesterol to be removed from the blood. Foods high in soluble fiber are dried beans, citrus fruits, oats, apples, bananas, broccoli, Brussels sprouts, and eggplant.  Try to include foods fortified with plant sterols or stanols, such as yogurt, breads, juices, or margarines. Choose several fortified foods to achieve a daily  intake of 2 to 3 grams of plant sterols or stanols.  Foods with omega-3 fats can help reduce your risk of heart disease. Aim to have a 3.5 oz portion of fatty fish twice per week, such as salmon, mackerel, albacore tuna, sardines, lake trout, or herring. If you wish to take a fish oil supplement, choose one that contains 1 gram of both DHA and EPA.  Limit processed meats to 2 servings (3 oz portion) weekly.  Limit the sodium in your diet to 1500 milligrams (mg) per day. If you have high blood pressure, talk to a registered dietitian about a DASH (Dietary Approaches to Stop Hypertension) eating plan.  Limit sweets and beverages with added sugar, such as soda, to no more than 5 servings per week. One serving is:   1 tablespoon sugar.  1  tablespoon jelly or jam.   cup sorbet.  1 cup lemonade.   cup regular soda. CHOOSING FOODS Starches  Allowed: Breads: All kinds (wheat, rye, raisin, white, oatmeal, [place], [place], and English muffin bread). Low-fat rolls: English muffins, frankfurter and hamburger buns, bagels, pita bread, tortillas (not fried). Pancakes, waffles, biscuits, and muffins made with recommended oil.  Avoid: Products made with saturated or trans fats, oils, or whole milk products. Butter rolls, cheese breads, croissants. Commercial doughnuts, muffins, sweet rolls, biscuits, waffles, pancakes, store-bought mixes. Crackers  Allowed: Low-fat crackers and snacks: Animal, graham, rye, saltine (with recommended oil, no lard), oyster, and matzo crackers. Bread sticks, melba toast, rusks, flatbread, pretzels, and light popcorn.  Avoid: High-fat crackers: cheese crackers, butter crackers, and those made with coconut, palm oil, or trans fat (hydrogenated oils). Buttered popcorn. Cereals  Allowed: Hot or cold whole-grain cereals.  Avoid: Cereals containing coconut, hydrogenated vegetable fat, or animal fat. Potatoes / Pasta / Rice  Allowed: All kinds of potatoes, rice, and pasta (such as macaroni, spaghetti, and noodles).  Avoid: Pasta or rice prepared with cream sauce or high-fat cheese. Chow mein noodles, [place] fries. Vegetables  Allowed: All vegetables and vegetable juices.  Avoid: Fried vegetables. Vegetables in cream, butter, or high-fat cheese sauces. Limit coconut. Fruit in cream or custard. Protein  Allowed: Limit your intake of meat, seafood, and poultry to no more than 6 oz (cooked weight) per day. All lean, well-trimmed beef, veal, pork, and lamb. All chicken and [place] without skin. All fish and shellfish. Wild game: wild duck, rabbit, pheasant, and venison. Egg whites or low-cholesterol egg substitutes may be used as desired. Meatless dishes: recipes with dried beans, peas, lentils, and tofu  (soybean curd). Seeds and nuts: all seeds and most nuts.  Avoid: Prime grade and other heavily marbled and fatty meats, such as short ribs, spare ribs, rib eye roast or steak, frankfurters, sausage, bacon, and high-fat luncheon meats, mutton. Caviar. Commercially fried fish. Domestic duck, goose, venison sausage. Organ meats: liver, gizzard, heart, chitterlings, brains, kidney, sweetbreads. Dairy  Allowed: Low-fat cheeses: nonfat or low-fat cottage cheese (1% or 2% fat), cheeses made with part skim milk, such as mozzarella, farmers, string, or ricotta. (Cheeses should be labeled no more than 2 to 6 grams fat per oz.). Skim (or 1%) milk: liquid, powdered, or evaporated. Buttermilk made with low-fat milk. Drinks made with skim or low-fat milk or cocoa. Chocolate milk or cocoa made with skim or low-fat (1%) milk. Nonfat or low-fat yogurt.  Avoid: Whole milk cheeses, including colby, cheddar, mu[place]ster, Monterey Jack, [place], [place], [place], American, Swiss, and blue. Creamed cottage cheese, cream cheese. Whole milk and whole milk products,  including buttermilk or yogurt made from whole milk, drinks made from whole milk. Condensed milk, evaporated whole milk, and 2% milk. Soups and Combination Foods  Allowed: Low-fat low-sodium soups: broth, dehydrated soups, homemade broth, soups with the fat removed, homemade cream soups made with skim or low-fat milk. Low-fat spaghetti, lasagna, chili, and Spanish rice if low-fat ingredients and low-fat cooking techniques are used.  Avoid: Cream soups made with whole milk, cream, or high-fat cheese. All other soups. Desserts and Sweets  Allowed: Sherbet, fruit ices, gelatins, meringues, and angel food cake. Homemade desserts with recommended fats, oils, and milk products. Jam, jelly, honey, marmalade, sugars, and syrups. Pure sugar candy, such as gum drops, hard candy, jelly beans, marshmallows, mints, and small amounts of dark chocolate.  Avoid: Commercially  prepared cakes, pies, cookies, frosting, pudding, or mixes for these products. Desserts containing whole milk products, chocolate, coconut, lard, palm oil, or palm kernel oil. Ice cream or ice cream drinks. Candy that contains chocolate, coconut, butter, hydrogenated fat, or unknown ingredients. Buttered syrups. Fats and Oils  Allowed: Vegetable oils: safflower, sunflower, corn, soybean, cottonseed, sesame, canola, olive, or peanut. Non-hydrogenated margarines. Salad dressing or mayonnaise: homemade or commercial, made with a recommended oil. Low or nonfat salad dressing or mayonnaise.  Limit added fats and oils to 6 to 8 tsp per day (includes fats used in cooking, baking, salads, and spreads on bread). Remember to count the "hidden fats" in foods.  Avoid: Solid fats and shortenings: butter, lard, salt pork, bacon drippings. Gravy containing meat fat, shortening, or suet. Cocoa butter, coconut. Coconut oil, palm oil, palm kernel oil, or hydrogenated oils: these ingredients are often used in bakery products, nondairy creamers, whipped toppings, candy, and commercially fried foods. Read labels carefully. Salad dressings made of unknown oils, sour cream, or cheese, such as blue cheese and Roquefort. Cream, all kinds: half-and-half, light, heavy, or whipping. Sour cream or cream cheese (even if "light" or low-fat). Nondairy cream substitutes: coffee creamers and sour cream substitutes made with palm, palm kernel, hydrogenated oils, or coconut oil. Beverages  Allowed: Coffee (regular or decaffeinated), tea. Diet carbonated beverages, mineral water. Alcohol: Check with your caregiver. Moderation is recommended.  Avoid: Whole milk, regular sodas, and juice drinks with added sugar. Condiments  Allowed: All seasonings and condiments. Cocoa powder. "Cream" sauces made with recommended ingredients.  Avoid: Carob powder made with hydrogenated fats. SAMPLE MENU Breakfast   cup orange juice   cup  oatmeal  1 slice toast  1 tsp margarine  1 cup skim milk Lunch  [place] sandwich with 2 oz [place], 2 slices bread  Lettuce and tomato slices  Fresh fruit  Carrot sticks  Coffee or tea Snack  Fresh fruit or low-fat crackers Dinner  3 oz lean ground beef  1 baked potato  1 tsp margarine   cup asparagus  Lettuce salad  1 tbs non-creamy dressing   cup peach slices  1 cup skim milk Document Released: 04/12/2008 Document Revised: 01/03/2012 Document Reviewed: 09/03/2013 [employer] Patient Information 2015 [place], [address]. This information is not intended to replace advice given to you by your health care provider. Make sure you discuss any questions you have with your health care provider.

## 2015-01-09 NOTE — Hospital Discharge Follow-Up (Signed)
Transitional Care Clinic Care Coordination Note:  Admit date:  01/07/2015 Discharge date: 01/09/2015 Discharge Disposition: Home to friend's home Patient contact: no active phone Emergency contact(s): Kees Idrovo (brother)-[phone] Patient indicates his brother can be called after discharge, and his brother will be able to get patient to return call to Case Manager  This Case Manager reviewed patient's EMR and determined patient would benefit from post-discharge medical management and chronic care management services through the [facility]. Patient has a history of acute decompensated systolic heart failure, hypertension, diabetes mellitus, CKD Stage IV, Cocaine abuse. Patient hospitalized for acute coronary syndrome in the setting of cocaine abuse. Patient has had 3 admissions and 1 ED visit in the last 6 months.  This Case Manager met with patient to discuss the services and medical management that can be provided at the [facility]. Patient verbalized understanding and agreed to receive post-discharge care at the [facility].   Patient scheduled for Transitional Care appointment on 01/14/15 at 1430.  Clinic information and appointment time provided to patient. Appointment information also placed on AVS.  Assessment:       Home Environment: Patient currently homeless, but he plans to stay at a friend's sons house after discharge (Address: [address], [place], [place]). Patient indicates he is aware of other shelter arrangements if his planned housing does not work out.       Support System: Candice Lunney (brother)       Level of functioning: independent with daily activities       Home DME: cane       Home care services: none       Transportation: Patient indicates he usually uses a bus for transportation to appointments.  He indicates he has never used Medicaid transportation though he indicates he was given information by Inpatient Social  Worker about Medicaid transportation and is aware he should call and do a transportation assessment to determine if he is eligible for services. Reiterated this information. Patient agreeable though he does indicate he plans to use a bus if he is unable to use Medicaid transportation.        Food/Nutrition: Patient indicates he receives $196/month in [employer].  His brother provides patient with food if he needs additional food. Patient indicates he has access to the food he needs.        Medications: Patient typically uses [facility] [employer]) for medications.  He indicates if he is unable to afford his copays his brother pays for his medications. Discussed medication resources available at [facility].        Identified Barriers: Homelessness-discussed community resources ([facility], [employer]). Patient aware of these community resources. Transportation also identified as a barrier.        PCP: Dr. Charolette Forward [phone]. [address], [address] Ph# [phone])             Arranged services:        Services communicated to Jacqlyn Krauss, RN CM

## 2015-01-09 NOTE — Discharge Summary (Signed)
Discharge summary dictated on 01/09/2015 dictation number is [phone]

## 2015-01-12 ENCOUNTER — Telehealth: Payer: Self-pay

## 2015-01-12 NOTE — Telephone Encounter (Signed)
Attempted to contact the patient at # [phone] (his brother, Tami Tardo phone #) to check on his status, confirm his [place] clinic appointment and to and review his discharge instructions.  The patient had requested that he be contacted via his brother's phone #.  Voice  Mail message left requesting a call back.

## 2015-01-13 ENCOUNTER — Telehealth: Payer: Self-pay | Admitting: General Practice

## 2015-01-13 ENCOUNTER — Telehealth: Payer: Self-pay

## 2015-01-13 NOTE — Telephone Encounter (Signed)
Called patient's brother, Legrand Como, to obtain a number to contact the patient.  Legrand Como noted that the patient could be reached at # [phone].   Transitional Care Clinic Post-discharge Follow-Up Phone Call:  Date of Discharge:01/09/2015 Principal Discharge Diagnosis(es): acute coronary syndrome in the setting of cocaine abuse Post-discharge Communication:  Called the patient at # [phone].   The call was disconnected while CM was talking to him.  He said that he was doing "ok."Multiple attempts were made to contact him at the same number and the message noted that the person was unavailable and instructions were to call back later.  Call Completed: Disconnected                  With Whom: Patient Interpreter Needed: No              Language/Dialect: English      Please check all that apply: - unable to complete as the call was disconnected and the follow up calls did not go through. .   ? Patient is knowledgeable of his/her condition(s) and/or treatment. ? Patient is caring for self at home.  ? Patient is receiving assist at home from family and/or caregiver. Family and/or caregiver is knowledgeable of patient's condition(s) and/or treatment. ? Patient is receiving home health services. If so, name of agency.     Medication Reconciliation:  ? Medication list reviewed with patient. X  Patient obtained all discharge medications. He said that his brother helps him pay for his medications when needed.    Activities of Daily Living:  unable to complete as the call was disconnected and the follow up calls did not go through. .    ?   Independent ? Needs assist (describe; ? home DME used) ? [facility] (describe, ? home DME used)   Community resources in place for patient: -  unable to complete as the call was disconnected and the follow up calls did not go through. .   ? None  ? Home Health/Home DME ? Assisted Living? Support Group  The patient may need transportation to his  appointment tomorrow. Rolanda Lundborg, [place] has been trying to contact the patient to determine his need for transportation.

## 2015-01-13 NOTE — Telephone Encounter (Signed)
Attempted to contact the patient again at # [phone]  to confirm his appointment for tomorrow.  The message said that the person is unavailable.  No option to leave a voice mail message.

## 2015-01-13 NOTE — Telephone Encounter (Signed)
I called patient at [phone] and brother answered, he stated that he was driving and would call me back with a phone number where I could reach the patient so I could ask if he was going to use the bus or Medicaid transportation. I also called medicaid but patient has never used their transportation so the patient would have to do the assessment first. The patient would have to either ride the bus or [facility] can arrange transportation until assessment is is done.

## 2015-01-14 ENCOUNTER — Telehealth: Payer: Self-pay

## 2015-01-14 ENCOUNTER — Inpatient Hospital Stay: Payer: Medicaid Other | Admitting: Family Medicine

## 2015-01-14 NOTE — Telephone Encounter (Signed)
Attempted to contact the patient again at # [phone] to confirm his appointment for tomorrow. The message said that the person is unavailable. No option to leave a voice mail message.  Called his brother, Legrand Como, # [phone] and informed him that this CM would like to speak to the patient.  He said that he would get the message to him.  Also informed Legrand Como that the phone # that CM was given to contact the patient is now now working.

## 2015-01-15 ENCOUNTER — Telehealth: Payer: Self-pay

## 2015-01-15 NOTE — Telephone Encounter (Signed)
Return call received from patient.  Patient indicates he did not come to [facility] appointment on 01/14/15 because he had an appointment with Dr. Terrence Dupont. Patient indicates he uses Dr. Terrence Dupont as his PCP and plans to continue to use him as PCP.  Transitional Care Clinic services not needed as patient plans to continue to follow with Dr. Terrence Dupont closely after discharge.

## 2015-01-15 NOTE — Telephone Encounter (Signed)
Transitional Care Clinic Post-discharge Follow-Up Phone Call:  Date of Discharge: 01/09/15 Principal Discharge Diagnosis(es): Acute coronary syndrome in setting of cocaine abuse, severe nonischemic dilated cardiomyopathy, acute decompensated CHF, HTN Post-discharge Communication: Attempt #5 to reach patient for discharge follow-up phone call. Unable to reach patient at [phone].  Call placed to emergency contact, Fielding Lagrone (brother) at [phone] who indicates he will ask patient to return call.

## 2015-03-01 ENCOUNTER — Encounter (HOSPITAL_COMMUNITY): Payer: Self-pay | Admitting: Emergency Medicine

## 2015-03-01 ENCOUNTER — Emergency Department (HOSPITAL_COMMUNITY): Payer: Medicaid Other

## 2015-03-01 ENCOUNTER — Emergency Department (HOSPITAL_COMMUNITY)
Admission: EM | Admit: 2015-03-01 | Discharge: 2015-03-01 | Disposition: A | Discharge status: Home / Self Care | Payer: Medicaid Other | Attending: Emergency Medicine | Admitting: Emergency Medicine

## 2015-03-01 DIAGNOSIS — Z87891 Personal history of nicotine dependence: Secondary | ICD-10-CM | POA: Insufficient documentation

## 2015-03-01 DIAGNOSIS — Z7902 Long term (current) use of antithrombotics/antiplatelets: Secondary | ICD-10-CM | POA: Insufficient documentation

## 2015-03-01 DIAGNOSIS — N289 Disorder of kidney and ureter, unspecified: Secondary | ICD-10-CM

## 2015-03-01 DIAGNOSIS — E780 Pure hypercholesterolemia: Secondary | ICD-10-CM | POA: Insufficient documentation

## 2015-03-01 DIAGNOSIS — Z590 Homelessness: Secondary | ICD-10-CM | POA: Insufficient documentation

## 2015-03-01 DIAGNOSIS — I252 Old myocardial infarction: Secondary | ICD-10-CM | POA: Diagnosis not present

## 2015-03-01 DIAGNOSIS — Z7982 Long term (current) use of aspirin: Secondary | ICD-10-CM | POA: Insufficient documentation

## 2015-03-01 DIAGNOSIS — N189 Chronic kidney disease, unspecified: Secondary | ICD-10-CM | POA: Insufficient documentation

## 2015-03-01 DIAGNOSIS — R1010 Upper abdominal pain, unspecified: Secondary | ICD-10-CM

## 2015-03-01 DIAGNOSIS — Z8659 Personal history of other mental and behavioral disorders: Secondary | ICD-10-CM | POA: Insufficient documentation

## 2015-03-01 DIAGNOSIS — M1990 Unspecified osteoarthritis, unspecified site: Secondary | ICD-10-CM | POA: Diagnosis not present

## 2015-03-01 DIAGNOSIS — I129 Hypertensive chronic kidney disease with stage 1 through stage 4 chronic kidney disease, or unspecified chronic kidney disease: Secondary | ICD-10-CM | POA: Insufficient documentation

## 2015-03-01 DIAGNOSIS — I10 Essential (primary) hypertension: Secondary | ICD-10-CM

## 2015-03-01 DIAGNOSIS — R1013 Epigastric pain: Secondary | ICD-10-CM | POA: Diagnosis present

## 2015-03-01 LAB — URINE MICROSCOPIC-ADD ON

## 2015-03-01 LAB — CBC
HEMATOCRIT: 45.1 % (ref 39.0–52.0)
Hemoglobin: 14.9 g/dL (ref 13.0–17.0)
MCH: 34.4 pg — ABNORMAL HIGH (ref 26.0–34.0)
MCHC: 33 g/dL (ref 30.0–36.0)
MCV: 104.2 fL — ABNORMAL HIGH (ref 78.0–100.0)
Platelets: 186 10*3/uL (ref 150–400)
RBC: 4.33 MIL/uL (ref 4.22–5.81)
RDW: 14.4 % (ref 11.5–15.5)
WBC: 5.5 10*3/uL (ref 4.0–10.5)

## 2015-03-01 LAB — COMPREHENSIVE METABOLIC PANEL
ALBUMIN: 3.9 g/dL (ref 3.5–5.0)
ALT: 39 U/L (ref 17–63)
AST: 47 U/L — AB (ref 15–41)
Alkaline Phosphatase: 132 U/L — ABNORMAL HIGH (ref 38–126)
Anion gap: 9 (ref 5–15)
BUN: 19 mg/dL (ref 6–20)
CO2: 21 mmol/L — AB (ref 22–32)
CREATININE: 1.86 mg/dL — AB (ref 0.61–1.24)
Calcium: 9.4 mg/dL (ref 8.9–10.3)
Chloride: 107 mmol/L (ref 101–111)
GFR calc Af Amer: 44 mL/min — ABNORMAL LOW (ref >60)
GFR calc non Af Amer: 38 mL/min — ABNORMAL LOW (ref >60)
GLUCOSE: 135 mg/dL — AB (ref 65–99)
POTASSIUM: 4.7 mmol/L (ref 3.5–5.1)
SODIUM: 137 mmol/L (ref 135–145)
TOTAL PROTEIN: 6.9 g/dL (ref 6.5–8.1)
Total Bilirubin: 1.5 mg/dL — ABNORMAL HIGH (ref 0.3–1.2)

## 2015-03-01 LAB — LIPASE, BLOOD: LIPASE: 18 U/L — AB (ref 22–51)

## 2015-03-01 LAB — URINALYSIS, ROUTINE W REFLEX MICROSCOPIC
BILIRUBIN URINE: NEGATIVE
GLUCOSE, UA: NEGATIVE mg/dL
KETONES UR: NEGATIVE mg/dL
NITRITE: NEGATIVE
Protein, ur: 30 mg/dL — AB
Specific Gravity, Urine: 1.013 (ref 1.005–1.030)
Urobilinogen, UA: 1 mg/dL (ref 0.0–1.0)
pH: 5.5 (ref 5.0–8.0)

## 2015-03-01 LAB — TROPONIN I

## 2015-03-01 LAB — ETHANOL: Alcohol, Ethyl (B): <5 mg/dL (ref <5)

## 2015-03-01 MED ORDER — FAMOTIDINE 20 MG PO TABS
20.0000 mg | ORAL_TABLET | Freq: Once | ORAL | Status: AC
Start: 2015-03-01 — End: 2015-03-01
  Administered 2015-03-01: 20 mg via ORAL
  Filled 2015-03-01: qty 1

## 2015-03-01 MED ORDER — GI COCKTAIL ~~LOC~~
30.0000 mL | Freq: Once | ORAL | Status: AC
  Administered 2015-03-01: 30 mL via ORAL
  Filled 2015-03-01: qty 30

## 2015-03-01 MED ORDER — PANTOPRAZOLE SODIUM 40 MG IV SOLR: 40.0000 mg | Freq: Once | INTRAVENOUS | Status: DC

## 2015-03-01 MED ORDER — HYDROCODONE-ACETAMINOPHEN 5-325 MG PO TABS
1.0000 | ORAL_TABLET | Freq: Once | ORAL | Status: AC
  Administered 2015-03-01: 1 via ORAL
  Filled 2015-03-01: qty 1

## 2015-03-01 MED ORDER — SODIUM CHLORIDE 0.9 % IV SOLN: INTRAVENOUS | Status: DC

## 2015-03-01 NOTE — Discharge Instructions (Signed)
It was our pleasure to provide your ER care today - we hope that you feel better.  Follow up with primary care doctor in the coming week - see referral to [facility] - they may also be able to help you with your medications.  Also have them recheck your blood pressure, as it is high today.   Also, for community resources, go to the [facility]) - [address] [phone].  Return to ER if worse, new symptoms, chest pain, trouble breathing, fevers, worsening or severe abdominal pain, other concern.   You were given pain medication in the ER - no driving for the next 4 hours.           Abdominal Pain Many things can cause abdominal pain. Usually, abdominal pain is not caused by a disease and will improve without treatment. It can often be observed and treated at home. Your health care provider will do a physical exam and possibly order blood tests and X-rays to help determine the seriousness of your pain. However, in many cases, more time must pass before a clear cause of the pain can be found. Before that point, your health care provider may not know if you need more testing or further treatment. HOME CARE INSTRUCTIONS  Monitor your abdominal pain for any changes. The following actions may help to alleviate any discomfort you are experiencing:  Only take over-the-counter or prescription medicines as directed by your health care provider.  Do not take laxatives unless directed to do so by your health care provider.  Try a clear liquid diet (broth, tea, or water) as directed by your health care provider. Slowly move to a bland diet as tolerated. SEEK MEDICAL CARE IF:  You have unexplained abdominal pain.  You have abdominal pain associated with nausea or diarrhea.  You have pain when you urinate or have a bowel movement.  You experience abdominal pain that wakes you in the night.  You have abdominal pain that is worsened or improved by eating  food.  You have abdominal pain that is worsened with eating fatty foods.  You have a fever. SEEK IMMEDIATE MEDICAL CARE IF:   Your pain does not go away within 2 hours.  You keep throwing up (vomiting).  Your pain is felt only in portions of the abdomen, such as the right side or the left lower portion of the abdomen.  You pass bloody or black tarry stools. MAKE SURE YOU:  Understand these instructions.   Will watch your condition.   Will get help right away if you are not doing well or get worse.  Document Released: 04/13/2005 Document Revised: 07/09/2013 Document Reviewed: 03/13/2013 [facility] Patient Information 2015 [place], [place]. This information is not intended to replace advice given to you by your health care provider. Make sure you discuss any questions you have with your health care provider.    Hypertension Hypertension, commonly called high blood pressure, is when the force of blood pumping through your arteries is too strong. Your arteries are the blood vessels that carry blood from your heart throughout your body. A blood pressure reading consists of a higher number over a lower number, such as 110/72. The higher number (systolic) is the pressure inside your arteries when your heart pumps. The lower number (diastolic) is the pressure inside your arteries when your heart relaxes. Ideally you want your blood pressure below 120/80. Hypertension forces your heart to work harder to pump blood. Your arteries may become  narrow or stiff. Having hypertension puts you at risk for heart disease, stroke, and other problems.  RISK FACTORS Some risk factors for high blood pressure are controllable. Others are not.  Risk factors you cannot control include:   Race. You may be at higher risk if you are African American.  Age. Risk increases with age.  Gender. Men are at higher risk than women before age 16 years. After age 35, women are at higher risk than men. Risk factors you  can control include:  Not getting enough exercise or physical activity.  Being overweight.  Getting too much fat, sugar, calories, or salt in your diet.  Drinking too much alcohol. SIGNS AND SYMPTOMS Hypertension does not usually cause signs or symptoms. Extremely high blood pressure (hypertensive crisis) may cause headache, anxiety, shortness of breath, and nosebleed. DIAGNOSIS  To check if you have hypertension, your health care provider will measure your blood pressure while you are seated, with your arm held at the level of your heart. It should be measured at least twice using the same arm. Certain conditions can cause a difference in blood pressure between your right and left arms. A blood pressure reading that is higher than normal on one occasion does not mean that you need treatment. If one blood pressure reading is high, ask your health care provider about having it checked again. TREATMENT  Treating high blood pressure includes making lifestyle changes and possibly taking medicine. Living a healthy lifestyle can help lower high blood pressure. You may need to change some of your habits. Lifestyle changes may include:  Following the DASH diet. This diet is high in fruits, vegetables, and whole grains. It is low in salt, red meat, and added sugars.  Getting at least 2 hours of brisk physical activity every week.  Losing weight if necessary.  Not smoking.  Limiting alcoholic beverages.  Learning ways to reduce stress. If lifestyle changes are not enough to get your blood pressure under control, your health care provider may prescribe medicine. You may need to take more than one. Work closely with your health care provider to understand the risks and benefits. HOME CARE INSTRUCTIONS  Have your blood pressure rechecked as directed by your health care provider.   Take medicines only as directed by your health care provider. Follow the directions carefully. Blood pressure  medicines must be taken as prescribed. The medicine does not work as well when you skip doses. Skipping doses also puts you at risk for problems.   Do not smoke.   Monitor your blood pressure at home as directed by your health care provider. SEEK MEDICAL CARE IF:   You think you are having a reaction to medicines taken.  You have recurrent headaches or feel dizzy.  You have swelling in your ankles.  You have trouble with your vision. SEEK IMMEDIATE MEDICAL CARE IF:  You develop a severe headache or confusion.  You have unusual weakness, numbness, or feel faint.  You have severe chest or abdominal pain.  You vomit repeatedly.  You have trouble breathing. MAKE SURE YOU:   Understand these instructions.  Will watch your condition.  Will get help right away if you are not doing well or get worse. Document Released: 07/04/2005 Document Revised: 11/18/2013 Document Reviewed: 04/26/2013 [facility] Patient Information 2015 [place], [place]. This information is not intended to replace advice given to you by your health care provider. Make sure you discuss any questions you have with your health care provider.  Emergency Department Resource Guide 1) Find a Doctor and Pay Out of Pocket Although you won't have to find out who is covered by your insurance plan, it is a good idea to ask around and get recommendations. You will then need to call the office and see if the doctor you have chosen will accept you as a new patient and what types of options they offer for patients who are self-pay. Some doctors offer discounts or will set up payment plans for their patients who do not have insurance, but you will need to ask so you aren't surprised when you get to your appointment.  2) Contact Your Local Health Department Not all health departments have doctors that can see patients for sick visits, but many do, so it is worth a call to see if yours does. If you don't know where your local  health department is, you can check in your phone book. The CDC also has a tool to help you locate your state's health department, and many state websites also have listings of all of their local health departments.  3) Find a [facility] If your illness is not likely to be very severe or complicated, you may want to try a walk in clinic. These are popping up all over the country in pharmacies, drugstores, and shopping centers. They're usually staffed by nurse practitioners or physician assistants that have been trained to treat common illnesses and complaints. They're usually fairly quick and inexpensive. However, if you have serious medical issues or chronic medical problems, these are probably not your best option.  No Primary Care Doctor: - Call Health Connect at  [phone] - they can help you locate a primary care doctor that  accepts your insurance, provides certain services, etc. - Physician Referral Service- [phone]  Chronic Pain Problems: Organization         Address  Phone   Notes  [facility]  [phone] Patients need to be referred by their primary care doctor.   Medication Assistance: Organization         Address  Phone   Notes  [facility] Medication [facility] [address] [place], [place] [address] [phone] --Must be a resident of [facility] -- Must have NO insurance coverage whatsoever (no Medicaid/ Medicare, etc.) -- The pt. MUST have a primary care doctor that directs their care regularly and follows them in the community   Med[facility]ist  [phone]   [employer]  [phone]    Agencies that provide inexpensive medical care: Organization         Address  Phone   Notes  [place]  [phone]   [facility]    [phone]   [facility] [place], [place] [address] [phone]   [place] [address] [place] [phone]   Planned Parenthood    [phone]   [facility]    [phone]   [place] [address], [place] Phone:  [phone], Fax:  [phone] Hours of Operation:  9 am - 6 pm, M-F.  Also accepts Medicaid/Medicare and self-pay.  [facility] for [place], [address], [place] Phone: [phone], Fax: [phone]. Hours of Operation:  8:30 am - 5:30 pm, M-F.  Also accepts Medicaid and self-pay.  [facility] [place]  [address] [employer] Phone: [phone]   [place], [place], [place] [phone], Ext. [phone] Mondays & Thursdays: 7-9 AM.  First 15 patients are seen on a first come, first serve basis.    [place] Providers:  Organization         Address  Phone   Notes  [facility] [address], [place] [phone] Also accepts self-pay patients.  [facility] [id number] [place], [place]  [phone]   [place], [address], [place] [phone]   [facility] [address] [place] [phone]   Lucianne Lei [address], [place]   [phone] Only accepts [place] Access [place] patients after they have their name applied to their card.   Self-Pay (no insurance) in [facility]:  Organization         Address  Phone   Notes  Sickle Cell Patients, [facility] [place] [phone]   [facility] [place] [phone]   [facility]  [place], [place],  [phone]   [facility]/Dr. Osei-Bonsu  [address], [place] or [address], [place] [phone] Phone number for both [place] and  [place] locations is the same.  [facility] [address], [place] [phone]   [facility] [address] [place] or [address] [place] [address] [phone]   [facility] [address], [place] [phone], phone; [phone], fax Sees patients 1st and 3rd Saturday of every month.  Must not qualify for public or private insurance (i.e. Medicaid, Medicare, [place] Health Choice, Veterans' Benefits)  Household income should be no more than 200% of the poverty level The clinic cannot treat you if you are pregnant or think you are pregnant  Sexually transmitted diseases are not treated at the clinic.    Dental Care: Organization         Address  Phone  Notes  [facility] Department of [facility] [phone] Accepts children up to age 66 who are enrolled in [place] or [place]; pregnant women with a Medicaid card; and children who have applied for Medicaid or [place] Health Choice, but were declined, whose parents can pay a reduced fee at time of service.  [facility] Department of [facility]  [address], [place] [phone] Accepts children up to age 73 who are enrolled in [place] or [place]; pregnant women with a Medicaid card; and children who have applied for Medicaid or [place] Health Choice, but were declined, whose parents can pay a reduced fee at time of service.  [place] [facility] Adult Dental Access PROGRAM  [place] [phone] Patients are seen by appointment only. Walk-ins are not accepted. [place] will see patients 34 years of age and older. Monday - Tuesday (8am-5pm) Most Wednesdays (8:30-5pm) $30 per visit, cash only  [facility] Adult Dental Access PROGRAM  [address], [facility] [phone] Patients are seen by appointment only. Walk-ins are not accepted.  [place] will see patients 32 years of age and older.  One Wednesday Evening (Monthly: Volunteer Based).  $30 per visit, cash only  [place]  [phone] for adults; Children under age 31, call Graduate Pediatric Dentistry at [phone]. Children aged 36-14, please call [phone] to request a pediatric application.  Dental services are provided in all areas of dental care including fillings, crowns and bridges, complete and partial dentures, implants, gum treatment, root canals, and extractions. Preventive care is also provided. Treatment is provided to both adults and children. Patients are selected via a lottery and there is often a waiting list.   [facility] [address] [place]  [phone] [email or web address]   [facility] [address] [place], [place] [phone], Ext. [phone] Second and Fourth Thursday of each month, opens at 6:30 AM; Clinic ends at 9 AM.  Patients are seen on a first-come first-served basis, and a limited number are seen during each clinic.   [facility]  [address] Hillard Danker [place], [place] [phone]   Eligibility Requirements You must have lived in [place], [place], or [place] counties for at least the last three months.   You cannot be eligible for state or federal sponsored [employer], including [employer], [place], or [employer].   You generally cannot be eligible for healthcare insurance through your employer.    How to apply: Eligibility screenings are held every Tuesday and Wednesday afternoon from 1:00 pm until 4:00 pm. You do not need an appointment for the interview!  [facility] [address], [place], [place]   [place] Department  [place]  [phone]    Behavioral Health Resources in the  Community: Intensive Outpatient Programs Organization         Address  Phone  Notes  [place]. [address], [place], [place] [phone]   [facility] [address] [place], [place]   ADS: [facility] [address] [place], [place]   [place] [address],  [place], [place] or [phone]   Substance Abuse Resources Organization         Address  Phone  Notes  Alcohol and Drug Services  [phone]   [place]  [phone]   [facility] [place]   Chinita Pester  [phone]   Residential & Outpatient Substance Abuse Program  [phone]   Psychological Services Organization         Address  Phone  Notes  [facility] [place]  [place]  [phone]   [place] [address] [place] or [phone]    Mobile Crisis Teams Organization         Address  Phone  Notes  [facility], Mobile Crisis Care Unit  [phone]   [facility]  [address] [place], [place]   [place] Levels [address], [place] [phone]    Self-Help/Support Groups Organization         Address  Phone             Notes  [place]. of [place] - variety of support groups  [place] Call for more information  Narcotics Anonymous (NA), [facility] [address], [employer] [place]  2 meetings at this location  Residential Treatment Programs Organization         Address  Phone  Notes  [facility] [address],    [place]  [phone]   [facility]  [address], [place] [id number], [place], [place]   [place], [place] [phone] Admissions: 8am-3pm M-F  Incentives Substance [place] [address]    [place], [place] [phone]   [facility] [address] [place], [place], [place]   [facility] [address],  [place], [place]   [facility] - Intensive Outpatient [address] [address] Kristeen Mans [address], [place], [place]   [facility] ([place] [place].,  [place], [place] [phone] or [phone]   [facility] Services (RTS) [address] [place], [place] Accepts Medicaid  [facility] [place] [address],  [place] [phone] Substance Abuse/Addiction Treatment   [facility] Organization         Address  Phone  Notes  [facility]  [phone]   Domenic Schwab, PhD [address] Arlis Porta [place], [place]   [phone] or [phone]   [place], [place] [phone]   [facility] [address], [place], [place] [phone] Insurance/Medicaid/sponsorship through [facility] and Families [address]                                    [place], [place] [phone] [place] [address], [place] [phone]    Dr. Adele Schilder  [phone]   [facility] Dept. 1) [address], [place] 2) [place] 3)  [address], [place] [phone]  [phone]   [place] [phone] or [phone] (After Hours)

## 2015-03-01 NOTE — ED Notes (Signed)
Patient transported to X-ray 

## 2015-03-01 NOTE — ED Notes (Signed)
Patient here via EMS with complaint of abdominal pain that prevents him from taking a deep breath. Endorses emesis over the past 2 days.

## 2015-03-01 NOTE — ED Provider Notes (Signed)
CSN: [id number]     Arrival date & time 03/01/15  0446 History   First MD Initiated Contact with Patient 03/01/15 0759     Chief Complaint  Patient presents with  . Abdominal Pain     (Consider location/radiation/quality/duration/timing/severity/associated sxs/prior Treatment) Patient is a 59 y.o. male presenting with abdominal pain. The history is provided by the patient.  Abdominal Pain Associated symptoms: no chest pain, no chills, no constipation, no cough, no diarrhea, no fever, no shortness of breath, no sore throat and no vomiting   pt c/o epigastric pain for the past few days. Pain constant, dull, non radiating. No specific exacerbating or alleviating factors. States same pain in past, but unsure of cause. Denies hx pud, pancreatitis or gallstones. No prior abd surgery. Had normal bm today. No abd distension. No nv. No chest pain or discomfort. No sob. No fever or chills.       Past Medical History  Diagnosis Date  . Hypertension   . Homelessness   . Urinary hesitancy   . Hypercholesterolemia   . NSTEMI (non-ST elevated myocardial infarction) 01/07/2015  . Heart attack 07/2014  . Walking pneumonia 07/2014  . Headache     "probably weekly" (01/07/2015)  . Arthritis     "all over" (01/07/2015)  . Anxiety   . Depression    Past Surgical History  Procedure Laterality Date  . Left heart catheterization with coronary angiogram N/A 08/15/2014    Procedure: LEFT HEART CATHETERIZATION WITH CORONARY ANGIOGRAM;  Surgeon: Clent Demark, MD;  Location: [facility] CATH LAB;  Service: Cardiovascular;  Laterality: N/A;  . Cardiac catheterization     History reviewed. No pertinent family history. Social History  Substance Use Topics  . Smoking status: Former Smoker -- 1.50 packs/day for 30 years    Types: Cigarettes    Quit date: 02/19/2004  . Smokeless tobacco: Never Used  . Alcohol Use: 2.4 oz/week    4 Cans of beer per week    Review of Systems  Constitutional: Negative for fever  and chills.  HENT: Negative for sore throat.   Eyes: Negative for redness.  Respiratory: Negative for cough and shortness of breath.   Cardiovascular: Negative for chest pain, palpitations and leg swelling.  Gastrointestinal: Positive for abdominal pain. Negative for vomiting, diarrhea, constipation and abdominal distention.  Endocrine: Negative for polyuria.  Genitourinary: Negative for flank pain.  Musculoskeletal: Negative for back pain and neck pain.  Skin: Negative for rash.  Neurological: Negative for headaches.  Hematological: Does not bruise/bleed easily.  Psychiatric/Behavioral: Negative for confusion.      Allergies  Review of patient's allergies indicates no known allergies.  Home Medications   Prior to Admission medications   Medication Sig Start Date End Date Taking? Authorizing Provider  aspirin EC 81 MG tablet Take 81 mg by mouth daily.    Historical Provider, MD  atorvastatin (LIPITOR) 20 MG tablet Take 1 tablet (20 mg total) by mouth daily. 09/07/14   Charolette Forward, MD  carvedilol (COREG) 6.25 MG tablet Take 1 tablet (6.25 mg total) by mouth 2 (two) times daily with a meal. 09/07/14   Charolette Forward, MD  clopidogrel (PLAVIX) 75 MG tablet Take 1 tablet (75 mg total) by mouth daily. 01/09/15   Charolette Forward, MD  digoxin (LANOXIN) 0.25 MG tablet Take 1 tablet (0.25 mg total) by mouth daily. 09/07/14   Charolette Forward, MD  furosemide (LASIX) 40 MG tablet Take 1 tablet (40 mg total) by mouth 2 (two) times daily. 09/07/14  Charolette Forward, MD  isosorbide mononitrate (IMDUR) 30 MG 24 hr tablet Take 1 tablet (30 mg total) by mouth daily. 01/09/15   Charolette Forward, MD  nitroGLYCERIN (NITROSTAT) 0.4 MG SL tablet Place 1 tablet (0.4 mg total) under the tongue every 5 (five) minutes x 3 doses as needed for chest pain. 08/16/14   Dixie Dials, MD  ramipril (ALTACE) 1.25 MG capsule Take 1 capsule (1.25 mg total) by mouth daily. 08/16/14   Dixie Dials, MD   BP 125/94 mmHg  Pulse 91   Temp(Src) 97.5 F (36.4 C) (Oral)  Resp 16  Ht 5\' 8"  (1.727 m)  Wt 155 lb (70.308 kg)  BMI 23.57 kg/m2  SpO2 98% Physical Exam  Constitutional: He is oriented to person, place, and time. He appears well-developed and well-nourished. No distress.  HENT:  Mouth/Throat: Oropharynx is clear and moist.  Eyes: Conjunctivae are normal. No scleral icterus.  Neck: Neck supple. No tracheal deviation present.  Cardiovascular: Normal rate, regular rhythm, normal heart sounds and intact distal pulses.  Exam reveals no gallop and no friction rub.   No murmur heard. Pulmonary/Chest: Effort normal and breath sounds normal. No accessory muscle usage. No respiratory distress.  Abdominal: Soft. Bowel sounds are normal. He exhibits no distension and no mass. There is tenderness. There is no rebound and no guarding.  Epigastric tenderness. No rebound or guarding.   Genitourinary:  No cva tenderness  Musculoskeletal: Normal range of motion. He exhibits no edema or tenderness.  Neurological: He is alert and oriented to person, place, and time.  Skin: Skin is warm and dry. He is not diaphoretic.  Psychiatric: He has a normal mood and affect.  Nursing note and vitals reviewed.   ED Course  Procedures (including critical care time) Labs Review   Results for orders placed or performed during the hospital encounter of 03/01/15  Lipase, blood  Result Value Ref Range   Lipase 18 (L) 22 - 51 U/L  Comprehensive metabolic panel  Result Value Ref Range   Sodium 137 135 - 145 mmol/L   Potassium 4.7 3.5 - 5.1 mmol/L   Chloride 107 101 - 111 mmol/L   CO2 21 (L) 22 - 32 mmol/L   Glucose, Bld 135 (H) 65 - 99 mg/dL   BUN 19 6 - 20 mg/dL   Creatinine, Ser 1.86 (H) 0.61 - 1.24 mg/dL   Calcium 9.4 8.9 - 10.3 mg/dL   Total Protein 6.9 6.5 - 8.1 g/dL   Albumin 3.9 3.5 - 5.0 g/dL   AST 47 (H) 15 - 41 U/L   ALT 39 17 - 63 U/L   Alkaline Phosphatase 132 (H) 38 - 126 U/L   Total Bilirubin 1.5 (H) 0.3 - 1.2 mg/dL    GFR calc non Af Amer 38 (L) >60 mL/min   GFR calc Af Amer 44 (L) >60 mL/min   Anion gap 9 5 - 15  CBC  Result Value Ref Range   WBC 5.5 4.0 - 10.5 K/uL   RBC 4.33 4.22 - 5.81 MIL/uL   Hemoglobin 14.9 13.0 - 17.0 g/dL   HCT 45.1 39.0 - 52.0 %   MCV 104.2 (H) 78.0 - 100.0 fL   MCH 34.4 (H) 26.0 - 34.0 pg   MCHC 33.0 30.0 - 36.0 g/dL   RDW 14.4 11.5 - 15.5 %   Platelets 186 150 - 400 K/uL  Urinalysis, Routine w reflex microscopic (not at [facility])  Result Value Ref Range   Color, Urine YELLOW YELLOW  APPearance CLOUDY (A) CLEAR   Specific Gravity, Urine 1.013 1.005 - 1.030   pH 5.5 5.0 - 8.0   Glucose, UA NEGATIVE NEGATIVE mg/dL   Hgb urine dipstick SMALL (A) NEGATIVE   Bilirubin Urine NEGATIVE NEGATIVE   Ketones, ur NEGATIVE NEGATIVE mg/dL   Protein, ur 30 (A) NEGATIVE mg/dL   Urobilinogen, UA 1.0 0.0 - 1.0 mg/dL   Nitrite NEGATIVE NEGATIVE   Leukocytes, UA TRACE (A) NEGATIVE  Urine microscopic-add on  Result Value Ref Range   Squamous Epithelial / LPF FEW (A) RARE   WBC, UA 7-10 <3 WBC/hpf   RBC / HPF 7-10 <3 RBC/hpf   Bacteria, UA FEW (A) RARE  Troponin I  Result Value Ref Range   Troponin I <0.03 <0.031 ng/mL  Ethanol  Result Value Ref Range   Alcohol, Ethyl (B) <5 <5 mg/dL   Dg Chest 2 View  03/01/2015   CLINICAL DATA:  59 year old male with severe epigastric abdominal pain, vomiting, shortness breath and constipation since December 2015.  EXAM: CHEST  2 VIEW  COMPARISON:  Chest x-ray 01/06/2015.  FINDINGS: Lungs are mildly hyperexpanded with increased retrosternal airspace and mild pruning of the pulmonary vasculature in the periphery, suggesting underlying emphysema. No acute consolidative airspace disease. No pleural effusions. No suspicious appearing pulmonary nodules or masses. No pneumothorax. No evidence of pulmonary edema. Mild cardiomegaly with prominence of the left ventricular contour, suggesting underlying left ventricular hypertrophy. Upper mediastinal  contours are within normal limits. Atherosclerosis in the thoracic aorta.  IMPRESSION: 1. No radiographic evidence of acute cardiopulmonary disease. 2. Cardiomegaly with probable left ventricular hypertrophy. 3. Atherosclerosis. 4. Emphysema.   Electronically Signed   By: Vinnie Langton M.D.   On: 03/01/2015 09:02      I, Isebella Upshur E, personally reviewed and evaluated these images and lab results as part of my medical decision-making.   EKG Interpretation   Date/Time:  Sunday March 01 2015 08:46:25 EDT Ventricular Rate:  90 PR Interval:  216 QRS Duration: 173 QT Interval:  412 QTC Calculation: 504 R Axis:   -86 Text Interpretation:  Sinus rhythm Prolonged PR interval RBBB and LAFB No  significant change since last tracing Confirmed by Tristin Gladman  MD, Lennette Bihari  ([id number]) on 03/01/2015 8:50:25 AM      MDM   Iv ns. Labs.  Reviewed nursing notes and prior charts for additional history.   Recheck pt, indicates is hungry/homeless.  Meal tray ordered.  Recheck, no increased wob or sob.  No chest pain. No headache. Afeb.  abd soft nt. Tolerating po.  Pt currently appears stable for d/c.     Lajean Saver, MD 03/01/15 1027

## 2015-03-07 ENCOUNTER — Emergency Department (HOSPITAL_COMMUNITY): Payer: Medicaid Other

## 2015-03-07 ENCOUNTER — Encounter (HOSPITAL_COMMUNITY): Payer: Self-pay | Admitting: Emergency Medicine

## 2015-03-07 ENCOUNTER — Inpatient Hospital Stay (HOSPITAL_COMMUNITY)
Admission: EM | Admit: 2015-03-07 | Discharge: 2015-03-10 | DRG: 292 | Disposition: A | Discharge status: Home / Self Care | Payer: Medicaid Other | Attending: Cardiology | Admitting: Cardiology

## 2015-03-07 DIAGNOSIS — I5021 Acute systolic (congestive) heart failure: Secondary | ICD-10-CM | POA: Diagnosis not present

## 2015-03-07 DIAGNOSIS — Z590 Homelessness: Secondary | ICD-10-CM | POA: Diagnosis not present

## 2015-03-07 DIAGNOSIS — N184 Chronic kidney disease, stage 4 (severe): Secondary | ICD-10-CM | POA: Diagnosis present

## 2015-03-07 DIAGNOSIS — Z87891 Personal history of nicotine dependence: Secondary | ICD-10-CM

## 2015-03-07 DIAGNOSIS — E780 Pure hypercholesterolemia: Secondary | ICD-10-CM | POA: Diagnosis present

## 2015-03-07 DIAGNOSIS — R51 Headache: Secondary | ICD-10-CM | POA: Diagnosis present

## 2015-03-07 DIAGNOSIS — I509 Heart failure, unspecified: Secondary | ICD-10-CM

## 2015-03-07 DIAGNOSIS — F1010 Alcohol abuse, uncomplicated: Secondary | ICD-10-CM | POA: Diagnosis present

## 2015-03-07 DIAGNOSIS — I420 Dilated cardiomyopathy: Secondary | ICD-10-CM | POA: Diagnosis present

## 2015-03-07 DIAGNOSIS — I129 Hypertensive chronic kidney disease with stage 1 through stage 4 chronic kidney disease, or unspecified chronic kidney disease: Secondary | ICD-10-CM | POA: Diagnosis present

## 2015-03-07 DIAGNOSIS — R112 Nausea with vomiting, unspecified: Secondary | ICD-10-CM

## 2015-03-07 DIAGNOSIS — Z9114 Patient's other noncompliance with medication regimen: Secondary | ICD-10-CM | POA: Diagnosis present

## 2015-03-07 DIAGNOSIS — F419 Anxiety disorder, unspecified: Secondary | ICD-10-CM | POA: Diagnosis present

## 2015-03-07 DIAGNOSIS — E1122 Type 2 diabetes mellitus with diabetic chronic kidney disease: Secondary | ICD-10-CM | POA: Diagnosis present

## 2015-03-07 DIAGNOSIS — F1410 Cocaine abuse, uncomplicated: Secondary | ICD-10-CM | POA: Diagnosis present

## 2015-03-07 DIAGNOSIS — R197 Diarrhea, unspecified: Secondary | ICD-10-CM | POA: Diagnosis not present

## 2015-03-07 DIAGNOSIS — I252 Old myocardial infarction: Secondary | ICD-10-CM | POA: Diagnosis not present

## 2015-03-07 DIAGNOSIS — F1290 Cannabis use, unspecified, uncomplicated: Secondary | ICD-10-CM | POA: Diagnosis present

## 2015-03-07 DIAGNOSIS — F329 Major depressive disorder, single episode, unspecified: Secondary | ICD-10-CM | POA: Diagnosis present

## 2015-03-07 DIAGNOSIS — M1389 Other specified arthritis, multiple sites: Secondary | ICD-10-CM | POA: Diagnosis present

## 2015-03-07 LAB — BASIC METABOLIC PANEL
ANION GAP: 10 (ref 5–15)
BUN: 22 mg/dL — ABNORMAL HIGH (ref 6–20)
CHLORIDE: 109 mmol/L (ref 101–111)
CO2: 19 mmol/L — ABNORMAL LOW (ref 22–32)
CREATININE: 1.89 mg/dL — AB (ref 0.61–1.24)
Calcium: 9.4 mg/dL (ref 8.9–10.3)
GFR calc non Af Amer: 37 mL/min — ABNORMAL LOW (ref >60)
GFR, EST AFRICAN AMERICAN: 43 mL/min — AB (ref >60)
Glucose, Bld: 124 mg/dL — ABNORMAL HIGH (ref 65–99)
POTASSIUM: 4.7 mmol/L (ref 3.5–5.1)
SODIUM: 138 mmol/L (ref 135–145)

## 2015-03-07 LAB — I-STAT TROPONIN, ED
Troponin i, poc: 0.03 ng/mL (ref 0.00–0.08)
Troponin i, poc: 0.03 ng/mL (ref 0.00–0.08)

## 2015-03-07 LAB — GLUCOSE, CAPILLARY: Glucose-Capillary: 112 mg/dL — ABNORMAL HIGH (ref 65–99)

## 2015-03-07 LAB — CBC WITH DIFFERENTIAL/PLATELET
BASOS PCT: 1 % (ref 0–1)
Basophils Absolute: 0.1 10*3/uL (ref 0.0–0.1)
EOS ABS: 0.1 10*3/uL (ref 0.0–0.7)
Eosinophils Relative: 1 % (ref 0–5)
HCT: 44.6 % (ref 39.0–52.0)
Hemoglobin: 14.9 g/dL (ref 13.0–17.0)
Lymphocytes Relative: 43 % (ref 12–46)
Lymphs Abs: 2.6 10*3/uL (ref 0.7–4.0)
MCH: 34.9 pg — AB (ref 26.0–34.0)
MCHC: 33.4 g/dL (ref 30.0–36.0)
MCV: 104.4 fL — ABNORMAL HIGH (ref 78.0–100.0)
MONO ABS: 0.6 10*3/uL (ref 0.1–1.0)
MONOS PCT: 11 % (ref 3–12)
Neutro Abs: 2.6 10*3/uL (ref 1.7–7.7)
Neutrophils Relative %: 44 % (ref 43–77)
Platelets: 167 10*3/uL (ref 150–400)
RBC: 4.27 MIL/uL (ref 4.22–5.81)
RDW: 14.6 % (ref 11.5–15.5)
WBC: 6 10*3/uL (ref 4.0–10.5)

## 2015-03-07 LAB — HEPATIC FUNCTION PANEL
ALBUMIN: 3.9 g/dL (ref 3.5–5.0)
ALK PHOS: 101 U/L (ref 38–126)
ALT: 30 U/L (ref 17–63)
AST: 40 U/L (ref 15–41)
Bilirubin, Direct: 0.6 mg/dL — ABNORMAL HIGH (ref 0.1–0.5)
Indirect Bilirubin: 1.1 mg/dL — ABNORMAL HIGH (ref 0.3–0.9)
TOTAL PROTEIN: 6.6 g/dL (ref 6.5–8.1)
Total Bilirubin: 1.7 mg/dL — ABNORMAL HIGH (ref 0.3–1.2)

## 2015-03-07 LAB — CREATININE, SERUM
Creatinine, Ser: 2.09 mg/dL — ABNORMAL HIGH (ref 0.61–1.24)
GFR, EST AFRICAN AMERICAN: 38 mL/min — AB (ref >60)
GFR, EST NON AFRICAN AMERICAN: 33 mL/min — AB (ref >60)

## 2015-03-07 LAB — CBC
HEMATOCRIT: 45.3 % (ref 39.0–52.0)
HEMOGLOBIN: 15.1 g/dL (ref 13.0–17.0)
MCH: 35 pg — ABNORMAL HIGH (ref 26.0–34.0)
MCHC: 33.3 g/dL (ref 30.0–36.0)
MCV: 104.9 fL — AB (ref 78.0–100.0)
PLATELETS: 177 10*3/uL (ref 150–400)
RBC: 4.32 MIL/uL (ref 4.22–5.81)
RDW: 14.5 % (ref 11.5–15.5)
WBC: 5.1 10*3/uL (ref 4.0–10.5)

## 2015-03-07 LAB — TSH: TSH: 2.864 u[IU]/mL (ref 0.350–4.500)

## 2015-03-07 LAB — BRAIN NATRIURETIC PEPTIDE: B Natriuretic Peptide: 4150 pg/mL — ABNORMAL HIGH (ref 0.0–100.0)

## 2015-03-07 LAB — RAPID URINE DRUG SCREEN, HOSP PERFORMED
Amphetamines: NOT DETECTED
BARBITURATES: POSITIVE — AB
BENZODIAZEPINES: NOT DETECTED
COCAINE: POSITIVE — AB
Opiates: NOT DETECTED
Tetrahydrocannabinol: POSITIVE — AB

## 2015-03-07 LAB — MRSA PCR SCREENING: MRSA BY PCR: NEGATIVE

## 2015-03-07 LAB — DIGOXIN LEVEL

## 2015-03-07 LAB — TROPONIN I
Troponin I: 0.03 ng/mL (ref <0.031)
Troponin I: 0.04 ng/mL — ABNORMAL HIGH (ref <0.031)

## 2015-03-07 LAB — LIPASE, BLOOD: LIPASE: 17 U/L — AB (ref 22–51)

## 2015-03-07 LAB — ETHANOL: Alcohol, Ethyl (B): <5 mg/dL (ref <5)

## 2015-03-07 MED ORDER — NITROGLYCERIN 0.4 MG SL SUBL: 0.4000 mg | SUBLINGUAL_TABLET | SUBLINGUAL | Status: DC | PRN

## 2015-03-07 MED ORDER — SODIUM CHLORIDE 0.9 % IJ SOLN
3.0000 mL | Freq: Two times a day (BID) | INTRAMUSCULAR | Status: DC
  Administered 2015-03-07 – 2015-03-09 (×5): 3 mL via INTRAVENOUS

## 2015-03-07 MED ORDER — SODIUM CHLORIDE 0.9 % IJ SOLN: 3.0000 mL | INTRAMUSCULAR | Status: DC | PRN

## 2015-03-07 MED ORDER — ATORVASTATIN CALCIUM 20 MG PO TABS
20.0000 mg | ORAL_TABLET | Freq: Every day | ORAL | Status: DC
  Administered 2015-03-08 – 2015-03-10 (×3): 20 mg via ORAL
  Filled 2015-03-07 (×3): qty 1

## 2015-03-07 MED ORDER — ONDANSETRON HCL 4 MG/2ML IJ SOLN
4.0000 mg | Freq: Once | INTRAMUSCULAR | Status: AC
  Administered 2015-03-07: 4 mg via INTRAVENOUS
  Filled 2015-03-07: qty 2

## 2015-03-07 MED ORDER — PROMETHAZINE HCL 25 MG/ML IJ SOLN
12.5000 mg | Freq: Once | INTRAMUSCULAR | Status: AC
  Administered 2015-03-07: 12.5 mg via INTRAVENOUS
  Filled 2015-03-07: qty 1

## 2015-03-07 MED ORDER — ACETAMINOPHEN 325 MG PO TABS
650.0000 mg | ORAL_TABLET | ORAL | Status: DC | PRN
  Administered 2015-03-09 – 2015-03-10 (×2): 650 mg via ORAL
  Filled 2015-03-07 (×2): qty 2

## 2015-03-07 MED ORDER — MILRINONE IN DEXTROSE 20 MG/100ML IV SOLN
0.1250 ug/kg/min | INTRAVENOUS | Status: DC
  Administered 2015-03-07 – 2015-03-09 (×3): 0.25 ug/kg/min via INTRAVENOUS
  Filled 2015-03-07 (×3): qty 100

## 2015-03-07 MED ORDER — ONDANSETRON HCL 4 MG/2ML IJ SOLN: 4.0000 mg | Freq: Four times a day (QID) | INTRAMUSCULAR | Status: DC | PRN

## 2015-03-07 MED ORDER — ASPIRIN EC 81 MG PO TBEC
81.0000 mg | DELAYED_RELEASE_TABLET | Freq: Every day | ORAL | Status: DC
  Administered 2015-03-08 – 2015-03-10 (×3): 81 mg via ORAL
  Filled 2015-03-07 (×3): qty 1

## 2015-03-07 MED ORDER — SODIUM CHLORIDE 0.9 % IV SOLN
250.0000 mL | INTRAVENOUS | Status: DC | PRN
  Administered 2015-03-07 – 2015-03-09 (×2): 250 mL via INTRAVENOUS

## 2015-03-07 MED ORDER — NITROGLYCERIN 2 % TD OINT
0.5000 [in_us] | TOPICAL_OINTMENT | Freq: Three times a day (TID) | TRANSDERMAL | Status: DC
  Administered 2015-03-07 – 2015-03-08 (×2): 0.5 [in_us] via TOPICAL
  Filled 2015-03-07: qty 30

## 2015-03-07 MED ORDER — CARVEDILOL 3.125 MG PO TABS
3.1250 mg | ORAL_TABLET | Freq: Two times a day (BID) | ORAL | Status: DC
  Administered 2015-03-07 – 2015-03-10 (×6): 3.125 mg via ORAL
  Filled 2015-03-07 (×6): qty 1

## 2015-03-07 MED ORDER — DIGOXIN 250 MCG PO TABS
0.2500 mg | ORAL_TABLET | Freq: Every day | ORAL | Status: DC
  Administered 2015-03-08 – 2015-03-10 (×3): 0.25 mg via ORAL
  Filled 2015-03-07 (×3): qty 1

## 2015-03-07 MED ORDER — HEPARIN SODIUM (PORCINE) 5000 UNIT/ML IJ SOLN
5000.0000 [IU] | Freq: Three times a day (TID) | INTRAMUSCULAR | Status: DC
  Administered 2015-03-07 – 2015-03-10 (×8): 5000 [IU] via SUBCUTANEOUS
  Filled 2015-03-07 (×7): qty 1

## 2015-03-07 MED ORDER — PANTOPRAZOLE SODIUM 40 MG IV SOLR: 40.0000 mg | Freq: Every day | INTRAVENOUS | Status: DC

## 2015-03-07 MED ORDER — INSULIN ASPART 100 UNIT/ML ~~LOC~~ SOLN: 0.0000 [IU] | Freq: Three times a day (TID) | SUBCUTANEOUS | Status: DC

## 2015-03-07 MED ORDER — FUROSEMIDE 10 MG/ML IJ SOLN
40.0000 mg | Freq: Two times a day (BID) | INTRAMUSCULAR | Status: DC
  Administered 2015-03-07 – 2015-03-09 (×4): 40 mg via INTRAVENOUS
  Filled 2015-03-07 (×4): qty 4

## 2015-03-07 MED ORDER — FUROSEMIDE 10 MG/ML IJ SOLN
80.0000 mg | Freq: Once | INTRAMUSCULAR | Status: AC
  Administered 2015-03-07: 80 mg via INTRAVENOUS
  Filled 2015-03-07: qty 8

## 2015-03-07 MED ORDER — NITROGLYCERIN 2 % TD OINT
1.0000 [in_us] | TOPICAL_OINTMENT | Freq: Once | TRANSDERMAL | Status: AC
  Administered 2015-03-07: 1 [in_us] via TOPICAL
  Filled 2015-03-07: qty 1

## 2015-03-07 NOTE — Progress Notes (Signed)
Attempted Report x2, waiting on RN to call back

## 2015-03-07 NOTE — H&P (Signed)
[name] is an 59 y.o. male.   Chief Complaint: Progressive increasing shortness of breath associated with vague abdominal pain HPI: Patient is 59 year old male with past rectal history significant for nonischemic dilated cardiomyopathy, history of acute coronary syndrome in the setting of cocaine abuse noted to have severe liver depressed LV systolic function EF of 95-18% with patent coronary arteries in the past, hypertension, non-insulin-dependent diabetes mellitus controlled by diet and, history of tobacco abuse, history of cocaine abuse, history of alcohol abuse, chronic kidney disease stage IV, came to the ER complaining of vague retrosternal chest pain associated shortness of breath and abdominal pain since last night patient states he ran out of all medications last Sunday. Patient does give history of PND orthopnea. Patient was noted to be in mild heart failure with markedly elevated BNP and received 80 mg IV Lasix with minimal improvement in his breathing. Patient noncompliant to medication and follow-up. Denies any recent cocaine or alcohol abuse.  Past Medical History  Diagnosis Date  . Hypertension   . Homelessness   . Urinary hesitancy   . Hypercholesterolemia   . NSTEMI (non-ST elevated myocardial infarction) 01/07/2015  . Heart attack 07/2014  . Walking pneumonia 07/2014  . Headache     "probably weekly" (01/07/2015)  . Arthritis     "all over" (01/07/2015)  . Anxiety   . Depression     Past Surgical History  Procedure Laterality Date  . Left heart catheterization with coronary angiogram N/A 08/15/2014    Procedure: LEFT HEART CATHETERIZATION WITH CORONARY ANGIOGRAM;  Surgeon: Clent Demark, MD;  Location: [facility] CATH LAB;  Service: Cardiovascular;  Laterality: N/A;  . Cardiac catheterization      No family history on file. Social History:  reports that he quit smoking about 11 years ago. His smoking use included Cigarettes. He has a 45 pack-year smoking history. He has  never used smokeless tobacco. He reports that he drinks about 2.4 oz of alcohol per week. He reports that he uses illicit drugs (Cocaine).  Allergies: No Known Allergies   (Not in a hospital admission)  Results for orders placed or performed during the hospital encounter of 03/07/15 (from the past 48 hour(s))  Basic metabolic panel     Status: Abnormal   Collection Time: 03/07/15 12:00 PM  Result Value Ref Range   Sodium 138 135 - 145 mmol/L   Potassium 4.7 3.5 - 5.1 mmol/L   Chloride 109 101 - 111 mmol/L   CO2 19 (L) 22 - 32 mmol/L   Glucose, Bld 124 (H) 65 - 99 mg/dL   BUN 22 (H) 6 - 20 mg/dL   Creatinine, Ser 1.89 (H) 0.61 - 1.24 mg/dL   Calcium 9.4 8.9 - 10.3 mg/dL   GFR calc non Af Amer 37 (L) >60 mL/min   GFR calc Af Amer 43 (L) >60 mL/min    Comment: (NOTE) The eGFR has been calculated using the CKD EPI equation. This calculation has not been validated in all clinical situations. eGFR's persistently <60 mL/min signify possible Chronic Kidney Disease.    Anion gap 10 5 - 15  CBC     Status: Abnormal   Collection Time: 03/07/15 12:00 PM  Result Value Ref Range   WBC 5.1 4.0 - 10.5 K/uL   RBC 4.32 4.22 - 5.81 MIL/uL   Hemoglobin 15.1 13.0 - 17.0 g/dL   HCT 45.3 39.0 - 52.0 %   MCV 104.9 (H) 78.0 - 100.0 fL   MCH 35.0 (H) 26.0 -  34.0 pg   MCHC 33.3 30.0 - 36.0 g/dL   RDW 14.5 11.5 - 15.5 %   Platelets 177 150 - 400 K/uL  Hepatic function panel     Status: Abnormal   Collection Time: 03/07/15 12:00 PM  Result Value Ref Range   Total Protein 6.6 6.5 - 8.1 g/dL   Albumin 3.9 3.5 - 5.0 g/dL   AST 40 15 - 41 U/L   ALT 30 17 - 63 U/L   Alkaline Phosphatase 101 38 - 126 U/L   Total Bilirubin 1.7 (H) 0.3 - 1.2 mg/dL   Bilirubin, Direct 0.6 (H) 0.1 - 0.5 mg/dL   Indirect Bilirubin 1.1 (H) 0.3 - 0.9 mg/dL  Lipase, blood     Status: Abnormal   Collection Time: 03/07/15 12:00 PM  Result Value Ref Range   Lipase 17 (L) 22 - 51 U/L  Brain natriuretic peptide     Status:  Abnormal   Collection Time: 03/07/15 12:00 PM  Result Value Ref Range   B Natriuretic Peptide 4150.0 (H) 0.0 - 100.0 pg/mL  Ethanol     Status: None   Collection Time: 03/07/15 12:00 PM  Result Value Ref Range   Alcohol, Ethyl (B) <5 <5 mg/dL    Comment:        LOWEST DETECTABLE LIMIT FOR SERUM ALCOHOL IS 5 mg/dL FOR MEDICAL PURPOSES ONLY   Troponin I     Status: None   Collection Time: 03/07/15 12:00 PM  Result Value Ref Range   Troponin I 0.03 <0.031 ng/mL    Comment:        NO INDICATION OF MYOCARDIAL INJURY.   I-stat troponin, ED     Status: None   Collection Time: 03/07/15 12:11 PM  Result Value Ref Range   Troponin i, poc 0.03 0.00 - 0.08 ng/mL   Comment 3            Comment: Due to the release kinetics of cTnI, a negative result within the first hours of the onset of symptoms does not rule out myocardial infarction with certainty. If myocardial infarction is still suspected, repeat the test at appropriate intervals.   Urine rapid drug screen (hosp performed)     Status: Abnormal   Collection Time: 03/07/15 12:43 PM  Result Value Ref Range   Opiates NONE DETECTED NONE DETECTED   Cocaine POSITIVE (A) NONE DETECTED   Benzodiazepines NONE DETECTED NONE DETECTED   Amphetamines NONE DETECTED NONE DETECTED   Tetrahydrocannabinol POSITIVE (A) NONE DETECTED   Barbiturates POSITIVE (A) NONE DETECTED    Comment:        DRUG SCREEN FOR MEDICAL PURPOSES ONLY.  IF CONFIRMATION IS NEEDED FOR ANY PURPOSE, NOTIFY LAB WITHIN 5 DAYS.        LOWEST DETECTABLE LIMITS FOR URINE DRUG SCREEN Drug Class       Cutoff (ng/mL) Amphetamine      1000 Barbiturate      200 Benzodiazepine   841 Tricyclics       660 Opiates          300 Cocaine          300 THC              50   I-stat troponin, ED     Status: None   Collection Time: 03/07/15  4:03 PM  Result Value Ref Range   Troponin i, poc 0.03 0.00 - 0.08 ng/mL   Comment 3  Comment: Due to the release kinetics of  cTnI, a negative result within the first hours of the onset of symptoms does not rule out myocardial infarction with certainty. If myocardial infarction is still suspected, repeat the test at appropriate intervals.    Dg Chest 2 View  03/07/2015   CLINICAL DATA:  Chest pain. Shortness of breath. Onset of chest pain 1 day ago. Epigastric pain.  EXAM: CHEST  2 VIEW  COMPARISON:  03/01/2015.  08/13/2014.  FINDINGS: Cardiopericardial silhouette is chronically enlarged. There is no pulmonary edema. There may be a small LEFT pleural effusion versus scarring with blunting of the LEFT costophrenic angle. Mild aortic arch atherosclerosis. Visible upper abdomen appears normal.  IMPRESSION: Chronic cardiomegaly.  Possible tiny LEFT effusion.   Electronically Signed   By: Dereck Ligas M.D.   On: 03/07/2015 13:51    Review of Systems  Constitutional: Negative for fever and chills.  Eyes: Negative for double vision.  Respiratory: Positive for shortness of breath.   Cardiovascular: Positive for chest pain and PND. Negative for palpitations and orthopnea.  Gastrointestinal: Positive for abdominal pain.  Genitourinary: Negative for dysuria.  Neurological: Negative for dizziness and headaches.    Blood pressure 106/71, pulse 92, temperature 97.4 F (36.3 C), temperature source Oral, resp. rate 28, height _0  (1.727 m), weight 69.854 kg (154 lb), SpO2 100 %. Physical Exam  Constitutional: He is oriented to person, place, and time.  Eyes: Conjunctivae are normal. Pupils are equal, round, and reactive to light.  Neck: Normal range of motion. Neck supple. No JVD present. No tracheal deviation present. No thyromegaly present.  Cardiovascular: Normal rate and regular rhythm.   Murmur (Soft systolic murmur and S3 gallop noted) heard. Respiratory:  Decreased breath sound at bases with faint rales noted  GI: Soft. Bowel sounds are normal.  Mild generalized tenderness noted no guarding or rebound   Musculoskeletal:  No clubbing cyanosis trace edema noted  Neurological: He is alert and oriented to person, place, and time.     Assessment/Plan Atypical chest pain rule out MI Mild decompensated acute systolic heart failure secondary to noncompliance to medication Severe nonischemic dilated myopathy Hypertension Diabetes mellitus Chronic kidney disease stage IV History of cocaine and alcohol and tobacco abuse in the past Plan As per orders  Charolette Forward 03/07/2015, 4:52 PM

## 2015-03-07 NOTE — Progress Notes (Signed)
Pharmacy Consult for Milrinone (Primacor) Initiation  Indication:   Acute Decompensated Heart Failure with volume overload and low cardiac output  No Known Allergies  Temp:  [97.4 F (36.3 C)-98.5 F (36.9 C)] 98.5 F (36.9 C) (08/20 2001) Pulse Rate:  [88-102] 102 (08/20 2001) Cardiac Rhythm:  [-] Normal sinus rhythm (08/20 1954) Resp:  [18-31] 31 (08/20 2001) BP: (102-139)/(71-99) 115/86 mmHg (08/20 2001) SpO2:  [91 %-100 %] 93 % (08/20 2001) Weight:  [154 lb (69.854 kg)-156 lb 4.8 oz (70.897 kg)] 156 lb 4.8 oz (70.897 kg) (08/20 2001)  LABS    Component Value Date/Time   NA 138 03/07/2015 1200   K 4.7 03/07/2015 1200   CL 109 03/07/2015 1200   CO2 19* 03/07/2015 1200   GLUCOSE 124* 03/07/2015 1200   BUN 22* 03/07/2015 1200   CREATININE 1.89* 03/07/2015 1200   CALCIUM 9.4 03/07/2015 1200   GFRNONAA 37* 03/07/2015 1200   GFRAA 43* 03/07/2015 1200   Last magnesium:  Lab Results  Component Value Date   MG 2.1 09/05/2014   Estimated Creatinine Clearance: 40.2 mL/min (by C-G formula based on Cr of 1.89). CREATININE: 1.89 mg/dL ABNORMAL (03/07/15 1200) Estimated creatinine clearance - 40.2 mL/min estimated creatinine clearance is 40.2 mL/min (by C-G formula based on Cr of 1.89).   Intake/Output Summary (Last 24 hours) at 03/07/15 2015 Last data filed at 03/07/15 2014  Gross per 24 hour  Intake      0 ml  Output    875 ml  Net   -875 ml    Filed Weights   03/07/15 1144 03/07/15 2001  Weight: 154 lb (69.854 kg) 156 lb 4.8 oz (70.897 kg)    Assessment:   59 y.o. male admitted 03/07/2015 with acute decompensated congestive heart failure to be initiated on milrinone.  Patient with EF 20 to 25% and 2 day history of SOB at rest.  Potassium, Magnesium, SCr, and vital signs are stable. Call physician for replacement if potassium is < 4 or magnesium is < 2 and replacement has not already been ordered.  Milrinone can cause arrhythmias.  Monitor patient for ECG changes.   Plan is to initiate milrinone for inotropic support.  Plan:  1. Initiate milrinone based on renal function: (Consider starting dose of 0.125 - 0.25 for patients with SBP <198mmHg) [employer] [facility] Calculated CrCl Dose  []  > 50 ml/min 0.375 mcg/kg/min  [x]  20-49 ml/min 0.250 mcg/kg/min  []  < 20 ml/min 0.125 mcg/kg/min   2. Nursing to monitor vital signs per milrinone protocol and physician parameters. 3. Pharmacy to follow peripherally, please reconsult if needed or there is further questions. 4.  Please contact MD for further dosing instructions.  Thank you for allowing [place] to be a part of this patient's care.  Tad Moore  8:15 PM 03/07/2015

## 2015-03-07 NOTE — ED Notes (Signed)
Onset of chest pain one day ago continued today with shortness of breath. Took own nitro with some relief. Pain currently 0/10 chest pain however points to epigastric area and pain is currently 10/10 sharp. History of MI 07/2014.

## 2015-03-07 NOTE — ED Provider Notes (Signed)
CSN: [id number]     Arrival date & time 03/07/15  1138 History   First MD Initiated Contact with Patient 03/07/15 1151     Chief Complaint  Patient presents with  . Chest Pain  . Shortness of Breath     (Consider location/radiation/quality/duration/timing/severity/associated sxs/prior Treatment) HPI Comments: Patient is a 59 yo M PMHx significant for CAD, CHF, HTN, cocaine abuse presenting to the ED for evaluation of acute onset chest pressure that began last night at [facility] and ended one hour prior to arrival. Patient states this feels like his last MI in June. His pain was relieved by nitroglycerin. He is chest pain free now, but still complaining of epigastric pain, states his epigastric pain was not "fixed" at his last ER visit. Refused cath in June.   Patient is a 59 y.o. male presenting with chest pain and shortness of breath.  Chest Pain Associated symptoms: abdominal pain, diaphoresis, nausea and shortness of breath   Shortness of Breath Associated symptoms: abdominal pain, chest pain and diaphoresis     Past Medical History  Diagnosis Date  . Hypertension   . Homelessness   . Urinary hesitancy   . Hypercholesterolemia   . NSTEMI (non-ST elevated myocardial infarction) 01/07/2015  . Heart attack 07/2014  . Walking pneumonia 07/2014  . Headache     "probably weekly" (01/07/2015)  . Arthritis     "all over" (01/07/2015)  . Anxiety   . Depression    Past Surgical History  Procedure Laterality Date  . Left heart catheterization with coronary angiogram N/A 08/15/2014    Procedure: LEFT HEART CATHETERIZATION WITH CORONARY ANGIOGRAM;  Surgeon: Clent Demark, MD;  Location: [facility] CATH LAB;  Service: Cardiovascular;  Laterality: N/A;  . Cardiac catheterization     No family history on file. Social History  Substance Use Topics  . Smoking status: Former Smoker -- 1.50 packs/day for 30 years    Types: Cigarettes    Quit date: 02/19/2004  . Smokeless tobacco: Never Used  . Alcohol  Use: 2.4 oz/week    4 Cans of beer per week    Review of Systems  Constitutional: Positive for diaphoresis.  Respiratory: Positive for shortness of breath.   Cardiovascular: Positive for chest pain.  Gastrointestinal: Positive for nausea and abdominal pain.  All other systems reviewed and are negative.     Allergies  Review of patient's allergies indicates no known allergies.  Home Medications   Prior to Admission medications   Medication Sig Start Date End Date Taking? Authorizing Provider  aspirin EC 81 MG tablet Take 81 mg by mouth daily.   Yes Historical Provider, MD  nitroGLYCERIN (NITROSTAT) 0.4 MG SL tablet Place 1 tablet (0.4 mg total) under the tongue every 5 (five) minutes x 3 doses as needed for chest pain. 08/16/14  Yes Dixie Dials, MD  atorvastatin (LIPITOR) 20 MG tablet Take 1 tablet (20 mg total) by mouth daily. Patient not taking: Reported on 03/01/2015 09/07/14   Charolette Forward, MD  carvedilol (COREG) 6.25 MG tablet Take 1 tablet (6.25 mg total) by mouth 2 (two) times daily with a meal. Patient not taking: Reported on 03/01/2015 09/07/14   Charolette Forward, MD  clopidogrel (PLAVIX) 75 MG tablet Take 1 tablet (75 mg total) by mouth daily. Patient not taking: Reported on 03/01/2015 01/09/15   Charolette Forward, MD  digoxin (LANOXIN) 0.25 MG tablet Take 1 tablet (0.25 mg total) by mouth daily. Patient not taking: Reported on 03/01/2015 09/07/14   Charolette Forward,  MD  furosemide (LASIX) 40 MG tablet Take 1 tablet (40 mg total) by mouth 2 (two) times daily. Patient not taking: Reported on 03/01/2015 09/07/14   Charolette Forward, MD  isosorbide mononitrate (IMDUR) 30 MG 24 hr tablet Take 1 tablet (30 mg total) by mouth daily. Patient not taking: Reported on 03/01/2015 01/09/15   Charolette Forward, MD  ramipril (ALTACE) 1.25 MG capsule Take 1 capsule (1.25 mg total) by mouth daily. Patient not taking: Reported on 03/01/2015 08/16/14   Dixie Dials, MD   BP 118/81 mmHg  Pulse 81  Temp(Src) 98.6 F  (37 C) (Oral)  Resp 24  Ht 5\' 8"  (1.727 m)  Wt 156 lb 4.8 oz (70.897 kg)  BMI 23.77 kg/m2  SpO2 92% Physical Exam  Constitutional: He is oriented to person, place, and time. He appears well-developed and well-nourished.  HENT:  Head: Normocephalic and atraumatic.  Right Ear: External ear normal.  Left Ear: External ear normal.  Nose: Nose normal.  Mouth/Throat: Oropharynx is clear and moist. No oropharyngeal exudate.  Eyes: Conjunctivae are normal.  Neck: Neck supple.  Cardiovascular: Normal rate, regular rhythm, normal heart sounds and intact distal pulses.   Pulmonary/Chest: Effort normal and breath sounds normal. No respiratory distress.  Abdominal: Soft. Bowel sounds are normal. There is tenderness (epigastric). There is no rebound and no guarding.  Musculoskeletal: He exhibits edema (1+ bilateral ankle).  Neurological: He is alert and oriented to person, place, and time.  Skin: Skin is warm and dry.  Nursing note and vitals reviewed.   ED Course  Procedures (including critical care time) Medications  aspirin EC tablet 81 mg (not administered)  atorvastatin (LIPITOR) tablet 20 mg (not administered)  digoxin (LANOXIN) tablet 0.25 mg (not administered)  nitroGLYCERIN (NITROSTAT) SL tablet 0.4 mg (not administered)  sodium chloride 0.9 % injection 3 mL (3 mLs Intravenous Given 03/07/15 2200)  sodium chloride 0.9 % injection 3 mL (not administered)  0.9 %  sodium chloride infusion (250 mLs Intravenous New Bag/Given 03/07/15 2121)  acetaminophen (TYLENOL) tablet 650 mg (not administered)  ondansetron (ZOFRAN) injection 4 mg (not administered)  heparin injection 5,000 Units (5,000 Units Subcutaneous Given 03/08/15 0533)  furosemide (LASIX) injection 40 mg (40 mg Intravenous Given by Other 03/07/15 2111)  carvedilol (COREG) tablet 3.125 mg (3.125 mg Oral Given by Other 03/07/15 2111)  insulin aspart (novoLOG) injection 0-9 Units (not administered)  nitroGLYCERIN (NITROGLYN) 2 %  ointment 0.5 inch (0.5 inches Topical Given 03/08/15 0533)  pantoprazole (PROTONIX) injection 40 mg (not administered)  milrinone (PRIMACOR) 20 MG/100ML (0.2 mg/mL) infusion (0.25 mcg/kg/min  70.9 kg Intravenous New Bag/Given 03/07/15 2121)  nitroGLYCERIN (NITROGLYN) 2 % ointment 1 inch (1 inch Topical Given 03/07/15 1241)  ondansetron (ZOFRAN) injection 4 mg (4 mg Intravenous Given 03/07/15 1344)  furosemide (LASIX) injection 80 mg (80 mg Intravenous Given 03/07/15 1621)  promethazine (PHENERGAN) injection 12.5 mg (12.5 mg Intravenous Given 03/07/15 1633)    Labs Review Labs Reviewed  BASIC METABOLIC PANEL - Abnormal; Notable for the following:    CO2 19 (*)    Glucose, Bld 124 (*)    BUN 22 (*)    Creatinine, Ser 1.89 (*)    GFR calc non Af Amer 37 (*)    GFR calc Af Amer 43 (*)    All other components within normal limits  CBC - Abnormal; Notable for the following:    MCV 104.9 (*)    MCH 35.0 (*)    All other components within normal limits  HEPATIC FUNCTION PANEL - Abnormal; Notable for the following:    Total Bilirubin 1.7 (*)    Bilirubin, Direct 0.6 (*)    Indirect Bilirubin 1.1 (*)    All other components within normal limits  LIPASE, BLOOD - Abnormal; Notable for the following:    Lipase 17 (*)    All other components within normal limits  BRAIN NATRIURETIC PEPTIDE - Abnormal; Notable for the following:    B Natriuretic Peptide 4150.0 (*)    All other components within normal limits  URINE RAPID DRUG SCREEN, HOSP PERFORMED - Abnormal; Notable for the following:    Cocaine POSITIVE (*)    Tetrahydrocannabinol POSITIVE (*)    Barbiturates POSITIVE (*)    All other components within normal limits  BASIC METABOLIC PANEL - Abnormal; Notable for the following:    Glucose, Bld 108 (*)    BUN 29 (*)    Creatinine, Ser 2.08 (*)    GFR calc non Af Amer 33 (*)    GFR calc Af Amer 38 (*)    All other components within normal limits  CBC WITH DIFFERENTIAL/PLATELET - Abnormal;  Notable for the following:    MCV 104.4 (*)    MCH 34.9 (*)    All other components within normal limits  BRAIN NATRIURETIC PEPTIDE - Abnormal; Notable for the following:    B Natriuretic Peptide >4500.0 (*)    All other components within normal limits  DIGOXIN LEVEL - Abnormal; Notable for the following:    Digoxin Level <0.2 (*)    All other components within normal limits  TROPONIN I - Abnormal; Notable for the following:    Troponin I 0.04 (*)    All other components within normal limits  TROPONIN I - Abnormal; Notable for the following:    Troponin I 0.04 (*)    All other components within normal limits  CREATININE, SERUM - Abnormal; Notable for the following:    Creatinine, Ser 2.09 (*)    GFR calc non Af Amer 33 (*)    GFR calc Af Amer 38 (*)    All other components within normal limits  GLUCOSE, CAPILLARY - Abnormal; Notable for the following:    Glucose-Capillary 112 (*)    All other components within normal limits  GLUCOSE, CAPILLARY - Abnormal; Notable for the following:    Glucose-Capillary 107 (*)    All other components within normal limits  MRSA PCR SCREENING  ETHANOL  TROPONIN I  TSH  TROPONIN I  CBC  BASIC METABOLIC PANEL  I-STAT TROPOININ, ED  I-STAT TROPOININ, ED    Imaging Review Ct Abdomen Pelvis Wo Contrast  03/07/2015   CLINICAL DATA:  59 year old with chronic 7-8 month history of generalized and epigastric abdominal pain which has acutely worsened over the past 2 days, associated with nausea and vomiting. Episode of rectal bleeding approximately 3-4 months ago. No abdominal surgical history. Prior history of MI. Hyperbilirubinemia on current laboratory studies.  EXAM: CT ABDOMEN AND PELVIS WITHOUT CONTRAST  TECHNIQUE: Multidetector CT imaging of the abdomen and pelvis was performed following the standard protocol without IV contrast. Intravenous contrast was not administered due to an elevated serum creatinine of 1.89 and estimated GFR of 43. Oral  contrast was administered.  COMPARISON:  None.  FINDINGS: Hepatobiliary: Liver normal in size and appearance for the unenhanced technique. Very small gallstones layering dependently in the gallbladder. No evidence of acute cholecystitis. No biliary ductal dilation.  Spleen:  Normal in size and appearance for the  unenhanced technique.  Pancreas:  Normal in appearance.  No pancreatic ductal dilation.  Adrenal glands: Normal-appearing right adrenal gland. Approximate 1.9 x 3.3 x 2.7 cm mass arising from the left adrenal gland.  Genitourinary: Normal unenhanced appearance of both kidneys. No hydronephrosis. No urinary tract calculi on either side. Nonspecific perinephric stranding/edema bilaterally. Normal-appearing urinary bladder.  Marked prostate gland enlargement, the gland measuring approximately 5.8 x 5.9 x 5.6 cm. Normal seminal vesicles.  Gastrointestinal: Normal-appearing decompressed stomach. Normal-appearing small bowel. Moderate stool burden throughout normal appearing colon. Normal appendix in the right mid abdomen.  Ascites: Small amount of ascites in the perihepatic and perisplenic region, the right paracolic gutter, and dependently in the pelvis.  Vascular: Mild to moderate aortoiliofemoral atherosclerosis without aneurysm.  Lymphatic:  No pathologic lymphadenopathy in the abdomen or pelvis.  Other findings: Edema versus soft tissue stranding in the fat of the lesser sac and soft tissue thickening of the lateral conal fascia bilaterally.  Musculoskeletal: Degenerative disc disease and spondylosis at L2-3 and L3-4.  Visualized lower thorax: Small bilateral pleural effusions, right greater than left. Heart enlarged with left ventricular predominance.  IMPRESSION: 1. Cholelithiasis without CT evidence of acute cholecystitis. No biliary ductal dilation. 2. Mass involving the left adrenal gland, measured above. 3. Small amount of intra-abdominal and pelvic ascites. 4. Edema versus soft tissue stranding in the  fat of the lesser sac and soft tissue thickening of the lateral conal fascia bilaterally. While this may be due to fluid overload and/or CHF, malignancy is not entirely excluded based on this appearance. 5. Marked enlargement of the prostate gland, measured above. 6. Small bilateral pleural effusions, right greater than left. Cardiomegaly with left ventricular predominance.   Electronically Signed   By: Evangeline Dakin M.D.   On: 03/07/2015 17:52   Dg Chest 2 View  03/07/2015   CLINICAL DATA:  Chest pain. Shortness of breath. Onset of chest pain 1 day ago. Epigastric pain.  EXAM: CHEST  2 VIEW  COMPARISON:  03/01/2015.  08/13/2014.  FINDINGS: Cardiopericardial silhouette is chronically enlarged. There is no pulmonary edema. There may be a small LEFT pleural effusion versus scarring with blunting of the LEFT costophrenic angle. Mild aortic arch atherosclerosis. Visible upper abdomen appears normal.  IMPRESSION: Chronic cardiomegaly.  Possible tiny LEFT effusion.   Electronically Signed   By: Dereck Ligas M.D.   On: 03/07/2015 13:51   I have personally reviewed and evaluated these images and lab results as part of my medical decision-making.   EKG Interpretation   Date/Time:  Saturday March 07 2015 11:40:53 EDT Ventricular Rate:  101 PR Interval:  180 QRS Duration: 166 QT Interval:  410 QTC Calculation: 531 R Axis:   -88 Text Interpretation:  Sinus tachycardia Possible Left atrial enlargement  Left axis deviation Right bundle branch block Possible Anteroseptal  infarct , age undetermined Abnormal ECG No significant change was found  Confirmed by Wyvonnia Dusky  MD, STEPHEN [phone]) on 03/07/2015 12:24:15 PM      Discussed with Dr. Terrence Dupont who will see and admit patient.   MDM   Final diagnoses:  CHF exacerbation  Nausea and vomiting in adult   Filed Vitals:   03/08/15 0728  BP:   Pulse:   Temp: 98.6 F (37 C)  Resp:    Afebrile, NAD, non-toxic appearing, AAOx4.  I have reviewed  nursing notes, vital signs, and all lab and imaging results as noted above. Patient will be admitted to Dr. Terrence Dupont for CHF exacerbation as well as nausea  and vomiting.  Patient d/w with Dr. Wyvonnia Dusky, agrees with plan.    Baron Sane, PA-C 03/08/15 [id number]  Ezequiel Essex, MD 03/08/15 [phone]

## 2015-03-07 NOTE — ED Notes (Addendum)
Pt returns from xray. Pt had a panic attack and was attempting to get out of bed. Pt states he couldn't breathe laying down and had to get up. Pt sitting up in bed. Will continue to monitor the pt with door open.

## 2015-03-08 LAB — TROPONIN I
TROPONIN I: 0.03 ng/mL (ref <0.031)
Troponin I: 0.04 ng/mL — ABNORMAL HIGH (ref <0.031)

## 2015-03-08 LAB — BASIC METABOLIC PANEL
ANION GAP: 8 (ref 5–15)
Anion gap: 8 (ref 5–15)
BUN: 29 mg/dL — AB (ref 6–20)
BUN: 27 mg/dL — ABNORMAL HIGH (ref 6–20)
CALCIUM: 9.2 mg/dL (ref 8.9–10.3)
CALCIUM: 9.1 mg/dL (ref 8.9–10.3)
CO2: 24 mmol/L (ref 22–32)
CO2: 24 mmol/L (ref 22–32)
CREATININE: 2.08 mg/dL — AB (ref 0.61–1.24)
CREATININE: 2.09 mg/dL — AB (ref 0.61–1.24)
Chloride: 106 mmol/L (ref 101–111)
Chloride: 106 mmol/L (ref 101–111)
GFR calc Af Amer: 38 mL/min — ABNORMAL LOW (ref >60)
GFR calc non Af Amer: 33 mL/min — ABNORMAL LOW (ref >60)
GFR, EST AFRICAN AMERICAN: 38 mL/min — AB (ref >60)
GFR, EST NON AFRICAN AMERICAN: 33 mL/min — AB (ref >60)
GLUCOSE: 108 mg/dL — AB (ref 65–99)
GLUCOSE: 96 mg/dL (ref 65–99)
Potassium: 4.3 mmol/L (ref 3.5–5.1)
Potassium: 4.1 mmol/L (ref 3.5–5.1)
Sodium: 138 mmol/L (ref 135–145)
Sodium: 138 mmol/L (ref 135–145)

## 2015-03-08 LAB — GLUCOSE, CAPILLARY
GLUCOSE-CAPILLARY: 116 mg/dL — AB (ref 65–99)
GLUCOSE-CAPILLARY: 107 mg/dL — AB (ref 65–99)
GLUCOSE-CAPILLARY: 104 mg/dL — AB (ref 65–99)
Glucose-Capillary: 107 mg/dL — ABNORMAL HIGH (ref 65–99)

## 2015-03-08 MED ORDER — ISOSORB DINITRATE-HYDRALAZINE 20-37.5 MG PO TABS
1.0000 | ORAL_TABLET | Freq: Two times a day (BID) | ORAL | Status: DC
  Administered 2015-03-08 – 2015-03-10 (×5): 1 via ORAL
  Filled 2015-03-08 (×5): qty 1

## 2015-03-08 MED ORDER — PANTOPRAZOLE SODIUM 40 MG PO TBEC
40.0000 mg | DELAYED_RELEASE_TABLET | Freq: Every day | ORAL | Status: DC
  Administered 2015-03-08 – 2015-03-10 (×3): 40 mg via ORAL
  Filled 2015-03-08 (×3): qty 1

## 2015-03-08 NOTE — Progress Notes (Signed)
Subjective:  No further chest pain states breathing has improved diuresing well urine drug screen positive for cocaine but patient denies using cocaine states he has been smoking marijuana. Abdominal pain resolved.  Objective:  Vital Signs in the last 24 hours: Temp:  [97.4 F (36.3 C)-98.6 F (37 C)] 98.6 F (37 C) (08/21 0728) Pulse Rate:  [78-102] 81 (08/21 0640) Resp:  [13-31] 24 (08/21 0640) BP: (102-139)/(70-99) 118/81 mmHg (08/21 0640) SpO2:  [87 %-100 %] 92 % (08/21 0640) Weight:  [69.854 kg (154 lb)-70.897 kg (156 lb 4.8 oz)] 70.897 kg (156 lb 4.8 oz) (08/20 2001)  Intake/Output from previous day: 08/20 0701 - 08/21 0700 In: 333.7 [P.O.:240; I.V.:93.7] Out: 3425 [Urine:3425] Intake/Output from this shift: Total I/O In: 360 [P.O.:360] Out: -   Physical Exam: Neck: no adenopathy, no carotid bruit and supple, symmetrical, trachea midline Lungs: Decreased breath sound at bases with fine rales Heart: regular rate and rhythm, S1, S2 normal and Soft systolic murmur and S3 gallop noted Abdomen: soft, non-tender; bowel sounds normal; no masses,  no organomegaly Extremities: extremities normal, atraumatic, no cyanosis or edema  Lab Results:  Recent Labs  03/07/15 1200 03/07/15 2032  WBC 5.1 6.0  HGB 15.1 14.9  PLT 177 167    Recent Labs  03/07/15 1200 03/07/15 2032 03/08/15 0132  NA 138  --  138  K 4.7  --  4.3  CL 109  --  106  CO2 19*  --  24  GLUCOSE 124*  --  108*  BUN 22*  --  29*  CREATININE 1.89* 2.09* 2.08*    Recent Labs  03/07/15 2032 03/08/15 0132  TROPONINI 0.04* 0.04*   Hepatic Function Panel  Recent Labs  03/07/15 1200  PROT 6.6  ALBUMIN 3.9  AST 40  ALT 30  ALKPHOS 101  BILITOT 1.7*  BILIDIR 0.6*  IBILI 1.1*   No results for input(s): CHOL in the last 72 hours. No results for input(s): PROTIME in the last 72 hours.  Imaging: Imaging results have been reviewed and Ct Abdomen Pelvis Wo Contrast  03/07/2015   CLINICAL DATA:   59 year old with chronic 7-8 month history of generalized and epigastric abdominal pain which has acutely worsened over the past 2 days, associated with nausea and vomiting. Episode of rectal bleeding approximately 3-4 months ago. No abdominal surgical history. Prior history of MI. Hyperbilirubinemia on current laboratory studies.  EXAM: CT ABDOMEN AND PELVIS WITHOUT CONTRAST  TECHNIQUE: Multidetector CT imaging of the abdomen and pelvis was performed following the standard protocol without IV contrast. Intravenous contrast was not administered due to an elevated serum creatinine of 1.89 and estimated GFR of 43. Oral contrast was administered.  COMPARISON:  None.  FINDINGS: Hepatobiliary: Liver normal in size and appearance for the unenhanced technique. Very small gallstones layering dependently in the gallbladder. No evidence of acute cholecystitis. No biliary ductal dilation.  Spleen:  Normal in size and appearance for the unenhanced technique.  Pancreas:  Normal in appearance.  No pancreatic ductal dilation.  Adrenal glands: Normal-appearing right adrenal gland. Approximate 1.9 x 3.3 x 2.7 cm mass arising from the left adrenal gland.  Genitourinary: Normal unenhanced appearance of both kidneys. No hydronephrosis. No urinary tract calculi on either side. Nonspecific perinephric stranding/edema bilaterally. Normal-appearing urinary bladder.  Marked prostate gland enlargement, the gland measuring approximately 5.8 x 5.9 x 5.6 cm. Normal seminal vesicles.  Gastrointestinal: Normal-appearing decompressed stomach. Normal-appearing small bowel. Moderate stool burden throughout normal appearing colon. Normal appendix in the  right mid abdomen.  Ascites: Small amount of ascites in the perihepatic and perisplenic region, the right paracolic gutter, and dependently in the pelvis.  Vascular: Mild to moderate aortoiliofemoral atherosclerosis without aneurysm.  Lymphatic:  No pathologic lymphadenopathy in the abdomen or pelvis.   Other findings: Edema versus soft tissue stranding in the fat of the lesser sac and soft tissue thickening of the lateral conal fascia bilaterally.  Musculoskeletal: Degenerative disc disease and spondylosis at L2-3 and L3-4.  Visualized lower thorax: Small bilateral pleural effusions, right greater than left. Heart enlarged with left ventricular predominance.  IMPRESSION: 1. Cholelithiasis without CT evidence of acute cholecystitis. No biliary ductal dilation. 2. Mass involving the left adrenal gland, measured above. 3. Small amount of intra-abdominal and pelvic ascites. 4. Edema versus soft tissue stranding in the fat of the lesser sac and soft tissue thickening of the lateral conal fascia bilaterally. While this may be due to fluid overload and/or CHF, malignancy is not entirely excluded based on this appearance. 5. Marked enlargement of the prostate gland, measured above. 6. Small bilateral pleural effusions, right greater than left. Cardiomegaly with left ventricular predominance.   Electronically Signed   By: Evangeline Dakin M.D.   On: 03/07/2015 17:52   Dg Chest 2 View  03/07/2015   CLINICAL DATA:  Chest pain. Shortness of breath. Onset of chest pain 1 day ago. Epigastric pain.  EXAM: CHEST  2 VIEW  COMPARISON:  03/01/2015.  08/13/2014.  FINDINGS: Cardiopericardial silhouette is chronically enlarged. There is no pulmonary edema. There may be a small LEFT pleural effusion versus scarring with blunting of the LEFT costophrenic angle. Mild aortic arch atherosclerosis. Visible upper abdomen appears normal.  IMPRESSION: Chronic cardiomegaly.  Possible tiny LEFT effusion.   Electronically Signed   By: Dereck Ligas M.D.   On: 03/07/2015 13:51    Cardiac Studies:  Assessment/Plan:  Atypical chest pain MI ruled out Cocaine abuse Mild decompensated acute systolic heart failure secondary to noncompliance to medication Severe nonischemic dilated myopathy Hypertension Diabetes mellitus Chronic kidney  disease stage IV History of cocaine and alcohol and tobacco abuse in the past Plan DC Nitropaste start BiDil as per orders Continue rest of the medicines Check labs in a.m.  LOS: 1 day    Charolette Forward 03/08/2015, 9:03 AM

## 2015-03-08 NOTE — Progress Notes (Signed)
Per pt he has been having loose stools, RN has not seen stools. MD was notified. Pt placed on enteric precautions per MD, and also a C-diff PCR was ordered. Will continue to monitor. Etta Quill, RN

## 2015-03-08 NOTE — Progress Notes (Signed)
Utilization Review Completed.Reyanna Baley T8/21/2016  

## 2015-03-08 NOTE — Progress Notes (Signed)
Pt has had 3 loose stools in the last 5 hours. RN will notify MD

## 2015-03-09 LAB — BASIC METABOLIC PANEL
Anion gap: 7 (ref 5–15)
BUN: 26 mg/dL — AB (ref 6–20)
CO2: 30 mmol/L (ref 22–32)
Calcium: 8.8 mg/dL — ABNORMAL LOW (ref 8.9–10.3)
Chloride: 99 mmol/L — ABNORMAL LOW (ref 101–111)
Creatinine, Ser: 2.05 mg/dL — ABNORMAL HIGH (ref 0.61–1.24)
GFR calc Af Amer: 39 mL/min — ABNORMAL LOW (ref >60)
GFR, EST NON AFRICAN AMERICAN: 34 mL/min — AB (ref >60)
GLUCOSE: 92 mg/dL (ref 65–99)
POTASSIUM: 4.2 mmol/L (ref 3.5–5.1)
Sodium: 136 mmol/L (ref 135–145)

## 2015-03-09 LAB — GLUCOSE, CAPILLARY
GLUCOSE-CAPILLARY: 87 mg/dL (ref 65–99)
GLUCOSE-CAPILLARY: 111 mg/dL — AB (ref 65–99)
GLUCOSE-CAPILLARY: 91 mg/dL (ref 65–99)
GLUCOSE-CAPILLARY: 117 mg/dL — AB (ref 65–99)

## 2015-03-09 LAB — BRAIN NATRIURETIC PEPTIDE: B Natriuretic Peptide: 967.5 pg/mL — ABNORMAL HIGH (ref 0.0–100.0)

## 2015-03-09 LAB — CBC
HEMATOCRIT: 40.7 % (ref 39.0–52.0)
HEMOGLOBIN: 13.6 g/dL (ref 13.0–17.0)
MCH: 33.8 pg (ref 26.0–34.0)
MCHC: 33.4 g/dL (ref 30.0–36.0)
MCV: 101.2 fL — ABNORMAL HIGH (ref 78.0–100.0)
Platelets: 157 10*3/uL (ref 150–400)
RBC: 4.02 MIL/uL — ABNORMAL LOW (ref 4.22–5.81)
RDW: 13.7 % (ref 11.5–15.5)
WBC: 4.3 10*3/uL (ref 4.0–10.5)

## 2015-03-09 LAB — C DIFFICILE QUICK SCREEN W PCR REFLEX
C DIFFICLE (CDIFF) ANTIGEN: NEGATIVE
C Diff interpretation: NEGATIVE
C Diff toxin: NEGATIVE

## 2015-03-09 MED ORDER — FUROSEMIDE 40 MG PO TABS
40.0000 mg | ORAL_TABLET | Freq: Two times a day (BID) | ORAL | Status: DC
  Administered 2015-03-09 – 2015-03-10 (×2): 40 mg via ORAL
  Filled 2015-03-09 (×2): qty 1

## 2015-03-09 NOTE — Progress Notes (Signed)
Assumed care from off going RN @ 0730; patient alert & oriented times 4; patient ambulates in room with no c/o's at this time; milrinone @0 .125 mcg/min; possible d/c tomorrow; will give report to on coming RN @ 1900

## 2015-03-09 NOTE — Progress Notes (Signed)
Subjective:  Patient denies any chest pain.  States breathing has improved.  He agrees he was doing drugs, but states he will not do any more.  Objective:  Vital Signs in the last 24 hours: Temp:  [97.9 F (36.6 C)-98.7 F (37.1 C)] 97.9 F (36.6 C) (08/22 0719) Pulse Rate:  [78-86] 86 (08/22 0719) Resp:  [20-28] 20 (08/22 0719) BP: (111-131)/(64-75) 131/72 mmHg (08/22 0719) SpO2:  [94 %-99 %] 97 % (08/22 0719) Weight:  [67.268 kg (148 lb 4.8 oz)] 67.268 kg (148 lb 4.8 oz) (08/22 0441)  Intake/Output from previous day: 08/21 0701 - 08/22 0700 In: 1934 [P.O.:1800; I.V.:134] Out: 3753 [Urine:3750; Stool:3] Intake/Output from this shift: Total I/O In: 240 [P.O.:240] Out: 250 [Urine:250]  Physical Exam: Neck: no adenopathy, no carotid bruit, no JVD and supple, symmetrical, trachea midline Lungs: decreased breath sounds at bases Heart: regular rate and rhythm, S1, S2 normal and soft systolic murmur noted Abdomen: soft, non-tender; bowel sounds normal; no masses,  no organomegaly Extremities: extremities normal, atraumatic, no cyanosis or edema  Lab Results:  Recent Labs  03/07/15 2032 03/09/15 0409  WBC 6.0 4.3  HGB 14.9 13.6  PLT 167 157    Recent Labs  03/08/15 0900 03/09/15 0409  NA 138 136  K 4.1 4.2  CL 106 99*  CO2 24 30  GLUCOSE 96 92  BUN 27* 26*  CREATININE 2.09* 2.05*    Recent Labs  03/08/15 0132 03/08/15 0900  TROPONINI 0.04* 0.03   Hepatic Function Panel  Recent Labs  03/07/15 1200  PROT 6.6  ALBUMIN 3.9  AST 40  ALT 30  ALKPHOS 101  BILITOT 1.7*  BILIDIR 0.6*  IBILI 1.1*   No results for input(s): CHOL in the last 72 hours. No results for input(s): PROTIME in the last 72 hours.  Imaging: Imaging results have been reviewed and Ct Abdomen Pelvis Wo Contrast  03/07/2015   CLINICAL DATA:  58 year old with chronic 7-8 month history of generalized and epigastric abdominal pain which has acutely worsened over the past 2 days,  associated with nausea and vomiting. Episode of rectal bleeding approximately 3-4 months ago. No abdominal surgical history. Prior history of MI. Hyperbilirubinemia on current laboratory studies.  EXAM: CT ABDOMEN AND PELVIS WITHOUT CONTRAST  TECHNIQUE: Multidetector CT imaging of the abdomen and pelvis was performed following the standard protocol without IV contrast. Intravenous contrast was not administered due to an elevated serum creatinine of 1.89 and estimated GFR of 43. Oral contrast was administered.  COMPARISON:  None.  FINDINGS: Hepatobiliary: Liver normal in size and appearance for the unenhanced technique. Very small gallstones layering dependently in the gallbladder. No evidence of acute cholecystitis. No biliary ductal dilation.  Spleen:  Normal in size and appearance for the unenhanced technique.  Pancreas:  Normal in appearance.  No pancreatic ductal dilation.  Adrenal glands: Normal-appearing right adrenal gland. Approximate 1.9 x 3.3 x 2.7 cm mass arising from the left adrenal gland.  Genitourinary: Normal unenhanced appearance of both kidneys. No hydronephrosis. No urinary tract calculi on either side. Nonspecific perinephric stranding/edema bilaterally. Normal-appearing urinary bladder.  Marked prostate gland enlargement, the gland measuring approximately 5.8 x 5.9 x 5.6 cm. Normal seminal vesicles.  Gastrointestinal: Normal-appearing decompressed stomach. Normal-appearing small bowel. Moderate stool burden throughout normal appearing colon. Normal appendix in the right mid abdomen.  Ascites: Small amount of ascites in the perihepatic and perisplenic region, the right paracolic gutter, and dependently in the pelvis.  Vascular: Mild to moderate aortoiliofemoral atherosclerosis without  aneurysm.  Lymphatic:  No pathologic lymphadenopathy in the abdomen or pelvis.  Other findings: Edema versus soft tissue stranding in the fat of the lesser sac and soft tissue thickening of the lateral conal fascia  bilaterally.  Musculoskeletal: Degenerative disc disease and spondylosis at L2-3 and L3-4.  Visualized lower thorax: Small bilateral pleural effusions, right greater than left. Heart enlarged with left ventricular predominance.  IMPRESSION: 1. Cholelithiasis without CT evidence of acute cholecystitis. No biliary ductal dilation. 2. Mass involving the left adrenal gland, measured above. 3. Small amount of intra-abdominal and pelvic ascites. 4. Edema versus soft tissue stranding in the fat of the lesser sac and soft tissue thickening of the lateral conal fascia bilaterally. While this may be due to fluid overload and/or CHF, malignancy is not entirely excluded based on this appearance. 5. Marked enlargement of the prostate gland, measured above. 6. Small bilateral pleural effusions, right greater than left. Cardiomegaly with left ventricular predominance.   Electronically Signed   By: Evangeline Dakin M.D.   On: 03/07/2015 17:52   Dg Chest 2 View  03/07/2015   CLINICAL DATA:  Chest pain. Shortness of breath. Onset of chest pain 1 day ago. Epigastric pain.  EXAM: CHEST  2 VIEW  COMPARISON:  03/01/2015.  08/13/2014.  FINDINGS: Cardiopericardial silhouette is chronically enlarged. There is no pulmonary edema. There may be a small LEFT pleural effusion versus scarring with blunting of the LEFT costophrenic angle. Mild aortic arch atherosclerosis. Visible upper abdomen appears normal.  IMPRESSION: Chronic cardiomegaly.  Possible tiny LEFT effusion.   Electronically Signed   By: Dereck Ligas M.D.   On: 03/07/2015 13:51    Cardiac Studies:  Assessment/Plan:  Status postAtypical chest pain MI ruled out Cocaine abuse resolvingMild decompensated acute systolic heart failure secondary to noncompliance to medication Severe nonischemic dilated myopathy Hypertension Diabetes mellitus Chronic kidney disease stage IV History of cocaine and alcohol and tobacco abuse in the past Status post diarrhea Plan Wean off  milrinone. Change IV Lasix to by mouth Possible discharge tomorrow if stable  LOS: 2 days    Charolette Forward 03/09/2015, 10:58 AM

## 2015-03-09 NOTE — Progress Notes (Signed)
Initial Nutrition Assessment  DOCUMENTATION CODES:   Not applicable  INTERVENTION:   None at this time.   NUTRITION DIAGNOSIS:   Inadequate oral intake related to inability to eat, poor appetite, other (see comment) (homelessness) as evidenced by per patient/family report.    GOAL:   Patient will meet greater than or equal to 90% of their needs    MONITOR:   PO intake, I & O's, Labs, Skin, Weight trends  REASON FOR ASSESSMENT:   Malnutrition Screening Tool    ASSESSMENT:   Pt presents with mild systolic HF, h/o cocaine abuse, CKD Stg IV & T2DM. Pt reported to be homeless, will meet with SW prior to discharge. Came in with fluid accumulation due to CHF exacerbation.  Pt is 97% of IBW. Reports drop in weight from 170 lbs. Post-heart attack in January. Physical exam findings yield some evidence of mild-moderate malnutrition with depressed temples, muscle depletion at legs. When I asked pt about T2DM control he claimed he did not have diabetes.  Does not eat consistent meals though he says he has a girlfriend who cooks for him. Monitor weight change for signs of diuresis to indicate true weight, pt did not know usual weight as of recent.   Diet Order:  Diet heart healthy/carb modified Room service appropriate?: Yes; Fluid consistency:: Thin  Skin:  Reviewed, no issues  Last BM:  03/08/2015  Height:   Ht Readings from Last 1 Encounters:  03/07/15 5\' 8"  (1.727 m)    Weight:   Wt Readings from Last 1 Encounters:  03/09/15 148 lb 4.8 oz (67.268 kg)    Ideal Body Weight:  70 kg  BMI:  Body mass index is 22.55 kg/(m^2).  Estimated Nutritional Needs:   Kcal:  1600-1800  Protein:  67-80  Fluid:  >=/ 1.6L  EDUCATION NEEDS:   No education needs identified at this time  Satira Anis. Lusero Nordlund, MS, RD LDN After Hours/Weekend Pager [phone]

## 2015-03-10 LAB — BASIC METABOLIC PANEL
ANION GAP: 7 (ref 5–15)
BUN: 20 mg/dL (ref 6–20)
CALCIUM: 8.7 mg/dL — AB (ref 8.9–10.3)
CO2: 28 mmol/L (ref 22–32)
CREATININE: 1.87 mg/dL — AB (ref 0.61–1.24)
Chloride: 101 mmol/L (ref 101–111)
GFR, EST AFRICAN AMERICAN: 43 mL/min — AB (ref >60)
GFR, EST NON AFRICAN AMERICAN: 37 mL/min — AB (ref >60)
Glucose, Bld: 115 mg/dL — ABNORMAL HIGH (ref 65–99)
Potassium: 4.5 mmol/L (ref 3.5–5.1)
SODIUM: 136 mmol/L (ref 135–145)

## 2015-03-10 LAB — GLUCOSE, CAPILLARY: GLUCOSE-CAPILLARY: 85 mg/dL (ref 65–99)

## 2015-03-10 MED ORDER — ISOSORB DINITRATE-HYDRALAZINE 20-37.5 MG PO TABS: 1.0000 | ORAL_TABLET | Freq: Two times a day (BID) | ORAL | Status: DC

## 2015-03-10 NOTE — Discharge Summary (Signed)
NAMEJOLEN, [name]              ACCOUNT NO.:  [id number]  MEDICAL RECORD NO.:  [id number]  LOCATION:  3W27C                        FACILITY:  [facility]  PHYSICIAN:  Allegra Lai. Terrence Dupont, M.D. DATE OF BIRTH:  04/10/1956  DATE OF ADMISSION:  03/07/2015 DATE OF DISCHARGE:  03/10/2015                              DISCHARGE SUMMARY   ADMITTING DIAGNOSES: 1. Atypical chest pain, rule out myocardial infarction. 2. Mild, decompensated, acute systolic heart failure secondary to     noncompliance to medication. 3. Severe, nonischemic, dilated cardiomyopathy. 4. Hypertension. 5. Diabetes mellitus. 6. Chronic kidney disease, stage IV. 7. History of cocaine, alcohol and tobacco abuse in the past.  DISCHARGE DIAGNOSES: 1. Status post atypical chest pain, myocardial infarction ruled out in     the setting of cocaine abuse. 2. Compensated systolic heart failure. 3. Severe, nonischemic, dilated cardiomyopathy. 4. Hypertension. 5. Diabetes mellitus. 6. Chronic kidney disease, stage IV. 7. History of cocaine abuse, alcohol abuse and tobacco abuse.  DISCHARGED HOME MEDICATIONS: 1. Isosorbide/hydralazine 20/37.5 mg one tablet twice daily. 2. Aspirin 81 mg one tablet daily. 3. Atorvastatin 20 mg daily. 4. Carvedilol 6.25 mg twice daily. 5. Clopidogrel 75 mg one tablet daily. 6. Digoxin 0.25 mg daily. 7. Furosemide 40 mg twice daily. 8. Nitrostat 0.4 mg sublingual p.r.n.  The patient has been advised to     stop isosorbide mononitrate and ramipril.  Ramipril was     discontinued in view of progressive worsening renal function.  DIET:  Low salt, low cholesterol, 1800 calories ADA diet.  The patient has been advised to restrict fluid to 1 L per 24 hours.  The patient has been advised to comply with his medication, heart failure instructions have been given.  FOLLOWUP:  Follow up with me in 1 week.  CONDITION AT DISCHARGE:  Stable.  The patient has been advised to refrain from drugs including  cocaine.  BRIEF HISTORY AND HOSPITAL COURSE:  [name] is 59 year old male with past medical history significant for nonischemic dilated cardiomyopathy; history of acute coronary syndrome in the setting of cocaine abuse, noted to have severely depressed LV systolic function, EF of [id number] with patent coronary arteries in the past; hypertension; non-insulin- dependent diabetes mellitus, controlled by diet; history of tobacco abuse; cocaine abuse; alcohol abuse; chronic kidney disease, stage IV. He came to the ER, complaining of vague retrosternal chest pain associated with shortness of breath and abdominal pain since last night. The patient states, he ran out of all his medication last Sunday.  The patient does give history of PND, orthopnea, and was noted to be in mild heart failure with markedly elevated BNP.  The patient received 80 mg IV Lasix with minimal improvement in his breathing.  The patient noncompliant to medication and follow up.  Denies any recent cocaine or alcohol abuse.  Subsequently, urine drug screen came positive for cocaine abuse and agreed, he was doing cocaine.  No history of IVDA.  EXAMINATION:  VITAL SIGNS:  His blood pressure was 106/71, pulse 92, he was afebrile. HEENT:  Conjunctiva was pink. NECK:  Supple.  No JVD. LUNGS:  Decreased breath sounds at bases with faint rales were noted. CARDIOVASCULAR:  S1, S2 was  normal.  There was soft, systolic murmur and S3 gallop. ABDOMEN:  Soft.  There was mild generalized tenderness.  No guarding or rebound. EXTREMITIES:  There was no clubbing, cyanosis.  There was trace edema.  LABORATORY DATA:  Sodium was 138, potassium 4.7, BUN 22, creatinine 1.89.  Hemoglobin 15.1, hematocrit 45.3, white count of 5.1.  Three sets of troponin-I were negative.  Urine drug screen was positive for cocaine.  BNP was 4150, repeat BNP was above 4500.  Yesterday, BNP was 967, which is trending down.  Today, his sodium is 136, potassium  4.5, BUN 20, creatinine 1.87, glucose remains between 85-117.  EKG showed sinus tachycardia, possible left atrial enlargement, right bundle-branch block, possible anteroseptal infarct, age undetermined.  BRIEF HOSPITAL COURSE:  The patient was admitted to Telemetry Unit.  MI was ruled out by serial enzymes and EKG.  The patient was also started on IV milrinone and IV Lasix with good diuresis.  The patient did not have any further episodes of chest pain during the hospital stay.  His ACE inhibitors were held in view of worsening renal function, was started on BiDil, which he is tolerating it well.  His leg edema has completely resolved.  BNP is trending down.  Milrinone has been weaned off.  The patient will be discharged home on above medications and will be followed up in my office in 1 week.  We will follow his renal function as outpatient.  If his renal function improves, we will start him back on ACE inhibitors as outpatient.  The patient has been advised to comply with medication and follow up, and refrain from doing alcohol and drugs and smoking, to which, he agrees.  Heart failure instructions also have been given.  The patient has been advised to restrict fluid to 1 L per 24 hours.     Allegra Lai. Terrence Dupont, M.D.     MNH/MEDQ  D:  03/10/2015  T:  03/10/2015  Job:  [id number]

## 2015-03-10 NOTE — Progress Notes (Signed)
Assumed care from off going RN @ [phone]; patient alert & oriented times 4; ambulates in room with no signs of acute distress; no c/o's pain ; case worker spoke with patient; cane will be given; discharge patient this AM

## 2015-03-10 NOTE — Discharge Instructions (Signed)

## 2015-03-10 NOTE — Discharge Summary (Signed)
Discharge summary dictated on 03/10/2015 dictation number is [phone]

## 2015-03-10 NOTE — Care Management Note (Addendum)
Case Management Note  Patient Details  Name: Gal Lefton MRN: [id number] Date of Birth: 1956/02/25  Subjective/Objective:    Pt admitted for Acute systolic Heart Failure: Plan is for d/c today.                 Action/Plan: CM did provide pt with bus pass. He has a f/u appointment with Dr, Terrence Dupont. CM did make a call to [facility] to see if he could be a candidate for services. No further needs from CM at this time.    Bethena Roys, RN 03/10/2015, 10:44 AM

## 2015-03-11 ENCOUNTER — Encounter (HOSPITAL_COMMUNITY): Payer: Self-pay | Admitting: *Deleted

## 2015-03-11 ENCOUNTER — Emergency Department (INDEPENDENT_AMBULATORY_CARE_PROVIDER_SITE_OTHER)
Admission: EM | Admit: 2015-03-11 | Discharge: 2015-03-11 | Disposition: A | Discharge status: Other Acute Inpatient Hospital | Payer: Medicaid Other | Source: Home / Self Care | Attending: Family Medicine | Admitting: Family Medicine

## 2015-03-11 ENCOUNTER — Emergency Department (HOSPITAL_COMMUNITY): Payer: Medicaid Other

## 2015-03-11 ENCOUNTER — Emergency Department (HOSPITAL_COMMUNITY)
Admission: EM | Admit: 2015-03-11 | Discharge: 2015-03-11 | Disposition: A | Discharge status: Home / Self Care | Payer: Medicaid Other | Attending: Emergency Medicine | Admitting: Emergency Medicine

## 2015-03-11 DIAGNOSIS — Z7982 Long term (current) use of aspirin: Secondary | ICD-10-CM | POA: Insufficient documentation

## 2015-03-11 DIAGNOSIS — I252 Old myocardial infarction: Secondary | ICD-10-CM | POA: Diagnosis not present

## 2015-03-11 DIAGNOSIS — M10072 Idiopathic gout, left ankle and foot: Secondary | ICD-10-CM | POA: Diagnosis not present

## 2015-03-11 DIAGNOSIS — I129 Hypertensive chronic kidney disease with stage 1 through stage 4 chronic kidney disease, or unspecified chronic kidney disease: Secondary | ICD-10-CM | POA: Insufficient documentation

## 2015-03-11 DIAGNOSIS — M25572 Pain in left ankle and joints of left foot: Secondary | ICD-10-CM | POA: Diagnosis present

## 2015-03-11 DIAGNOSIS — N184 Chronic kidney disease, stage 4 (severe): Secondary | ICD-10-CM | POA: Diagnosis not present

## 2015-03-11 DIAGNOSIS — Z8701 Personal history of pneumonia (recurrent): Secondary | ICD-10-CM | POA: Insufficient documentation

## 2015-03-11 DIAGNOSIS — Z590 Homelessness: Secondary | ICD-10-CM | POA: Insufficient documentation

## 2015-03-11 DIAGNOSIS — F329 Major depressive disorder, single episode, unspecified: Secondary | ICD-10-CM | POA: Diagnosis not present

## 2015-03-11 DIAGNOSIS — M25579 Pain in unspecified ankle and joints of unspecified foot: Secondary | ICD-10-CM | POA: Diagnosis not present

## 2015-03-11 DIAGNOSIS — M109 Gout, unspecified: Secondary | ICD-10-CM

## 2015-03-11 DIAGNOSIS — Z8639 Personal history of other endocrine, nutritional and metabolic disease: Secondary | ICD-10-CM | POA: Insufficient documentation

## 2015-03-11 DIAGNOSIS — Z87891 Personal history of nicotine dependence: Secondary | ICD-10-CM | POA: Diagnosis not present

## 2015-03-11 DIAGNOSIS — Z79899 Other long term (current) drug therapy: Secondary | ICD-10-CM | POA: Diagnosis not present

## 2015-03-11 DIAGNOSIS — F419 Anxiety disorder, unspecified: Secondary | ICD-10-CM | POA: Diagnosis not present

## 2015-03-11 DIAGNOSIS — M1990 Unspecified osteoarthritis, unspecified site: Secondary | ICD-10-CM | POA: Insufficient documentation

## 2015-03-11 HISTORY — DX: Patient's noncompliance with other medical treatment and regimen due to unspecified reason: Z91.199

## 2015-03-11 HISTORY — DX: Patient's noncompliance with other medical treatment and regimen: Z91.19

## 2015-03-11 HISTORY — DX: Dilated cardiomyopathy: I25.5

## 2015-03-11 HISTORY — DX: Other psychoactive substance abuse, uncomplicated: F19.10

## 2015-03-11 HISTORY — DX: Chronic kidney disease, stage 4 (severe): N18.4

## 2015-03-11 HISTORY — DX: Personal history of other medical treatment: Z92.89

## 2015-03-11 HISTORY — DX: Ischemic cardiomyopathy: I42.0

## 2015-03-11 LAB — URIC ACID: URIC ACID, SERUM: 8.7 mg/dL — AB (ref 4.4–7.6)

## 2015-03-11 MED ORDER — PREDNISONE 20 MG PO TABS: 40.0000 mg | ORAL_TABLET | Freq: Every day | ORAL | Status: DC

## 2015-03-11 NOTE — Discharge Instructions (Signed)
Take the prescribed medication as directed.  Make sure to pick up your medications from the pharmacy tomorrow. Follow-up with your primary care physician. Return to the ED for new or worsening symptoms.   Emergency Department Resource Guide 1) Find a Doctor and Pay Out of Pocket Although you won't have to find out who is covered by your insurance plan, it is a good idea to ask around and get recommendations. You will then need to call the office and see if the doctor you have chosen will accept you as a new patient and what types of options they offer for patients who are self-pay. Some doctors offer discounts or will set up payment plans for their patients who do not have insurance, but you will need to ask so you aren't surprised when you get to your appointment.  2) Contact Your Local Health Department Not all health departments have doctors that can see patients for sick visits, but many do, so it is worth a call to see if yours does. If you don't know where your local health department is, you can check in your phone book. The CDC also has a tool to help you locate your state's health department, and many state websites also have listings of all of their local health departments.  3) Find a [facility] If your illness is not likely to be very severe or complicated, you may want to try a walk in clinic. These are popping up all over the country in pharmacies, drugstores, and shopping centers. They're usually staffed by nurse practitioners or physician assistants that have been trained to treat common illnesses and complaints. They're usually fairly quick and inexpensive. However, if you have serious medical issues or chronic medical problems, these are probably not your best option.  No Primary Care Doctor: - Call Health Connect at  [phone] - they can help you locate a primary care doctor that  accepts your insurance, provides certain services, etc. - Physician Referral Service-  [phone]  Chronic Pain Problems: Organization         Address  Phone   Notes  [facility]  [phone] Patients need to be referred by their primary care doctor.   Medication Assistance: Organization         Address  Phone   Notes  [facility] Medication [facility] [address] [place], [place] [address] [phone] --Must be a resident of [facility] -- Must have NO insurance coverage whatsoever (no Medicaid/ Medicare, etc.) -- The pt. MUST have a primary care doctor that directs their care regularly and follows them in the community   [facility]ist  [phone]   [employer]  [phone]    Agencies that provide inexpensive medical care: Organization         Address  Phone   Notes  [place]  [phone]   [facility]    [phone]   [facility] [place], [place] [address] [phone]   [place] [address] [place] [phone]   Planned Parenthood    [phone]   [facility]    [phone]   [place] and [address],  Phone:  [phone], Fax:  [phone] Hours of Operation:  9 am - 6 pm, M-F.  Also accepts Medicaid/Medicare and self-pay.  [facility]  [place], [address], [place] Phone: [phone], Fax: [phone]. Hours of Operation:  8:30 am - 5:30 pm, M-F.  Also accepts Medicaid and self-pay.  [facility] [address], [place] Phone: [phone]   [place], [place], [place] [phone], Ext. [phone] Mondays & Thursdays: 7-9 AM.  First 15 patients are seen on a first come, first serve basis.    [place] Providers:  Organization         Address  Phone   Notes  [facility] [address],  [place] [phone] Also accepts self-pay patients.  [facility] [address], [place]  [phone]   [place], [address], [place] [phone]   [facility] [address] [place] [phone]   Lucianne Lei [address], [place]   [phone] Only accepts [place] Access [place] patients after they have their name applied to their card.   Self-Pay (no insurance) in [facility]:  Organization         Address  Phone   Notes  Sickle Cell Patients, [facility] [place] [phone]   [facility] [place] [phone]   [facility] [place]  [place], [place], [place] [phone]   [facility]/Dr. Osei-Bonsu  [address], [place] or [address], [place] [phone] Phone number for both [place] and [place] locations is the same.  Urgent Medical [facility] [address], [place] [phone]   [facility] [address] [place] or [address] [phone]   [facility] [address] [place] [phone], phone; [phone], fax Sees patients 1st and 3rd Saturday of every month.  Must not qualify for public or private insurance (i.e. Medicaid, Medicare, [place] Health Choice, Veterans' Benefits)  Household income should be no more than 200% of the poverty level The clinic cannot treat you if you are pregnant or think you are pregnant  Sexually transmitted diseases are not treated at the clinic.    Dental Care: Organization         Address  Phone  Notes  [facility] Department of [facility] [place] [phone] Accepts children up to age 50 who are enrolled in  [place] or [place]; pregnant women with a Medicaid card; and children who have applied for Medicaid or [place] Health Choice, but were declined, whose parents can pay a reduced fee at time of service.  [facility] Department of [facility]  [address], [place] [phone] Accepts children up to age 26 who are enrolled in [place] or [place]; pregnant women with a Medicaid card; and children who have applied for Medicaid or [place] Health Choice, but were declined, whose parents can pay a reduced fee at time of service.  [place] Adult Dental Access PROGRAM  [place] [phone] Patients are seen by appointment only. Walk-ins are not accepted. [place] will see patients 70 years of age and older. Monday - Tuesday (8am-5pm) Most Wednesdays (8:30-5pm) $30 per visit, cash only  [place] Adult  Dental Access PROGRAM  [address], [facility] [phone] Patients are seen by appointment only. Walk-ins are not accepted. [place] will see patients 42 years of age and older. One Wednesday Evening (Monthly: Volunteer Based).  $30 per visit, cash only  [place]  [phone] for adults; Children under age 54, call Graduate Pediatric Dentistry at [phone]. Children aged 65-14, please call [phone] to request a pediatric application.  Dental services are provided in all areas of dental care including fillings, crowns and bridges, complete and partial dentures, implants, gum treatment, root canals, and extractions. Preventive care is also provided. Treatment is provided to both adults and children. Patients are selected via a lottery and there is often a waiting list.   [facility] [address] [place]  [phone] [email or web address]   [facility] [address] [place], [place] [phone], Ext. [phone] Second and Fourth Thursday of each month, opens at 6:30  AM; Clinic ends at 9 AM.  Patients are seen on a first-come first-served basis, and a limited number are seen during each clinic.   [facility]  [address] Hillard Danker [place], [place] [phone]   Eligibility Requirements You must have lived in [place], [place], or [place] counties for at least the last three months.   You cannot be eligible for state or federal sponsored [employer], including [employer], [place], or [employer].   You generally cannot be eligible for healthcare insurance through your employer.    How to apply: Eligibility screenings are held every Tuesday and Wednesday afternoon from 1:00 pm until 4:00 pm. You do not need an appointment for the interview!  [facility] [address] [place], [place]   [place]  [place] Department  [place]  [phone]    Behavioral Health Resources in the Community: Intensive Outpatient Programs Organization         Address  Phone  Notes  [place]. [address], [place], [place] [phone]   [facility] Outpatient [address], [place], [place]   ADS: Alcohol & Drug Svcs [address] [place], [place]   [place] [address]  [place], [place] or [phone]   Substance Abuse Resources Organization         Address  Phone  Notes  Alcohol and Drug Services  [phone]   [place]  [phone]   [facility] [place]   Chinita Pester  [phone]   Residential & Outpatient Substance Abuse Program  [phone]   Psychological Services Organization         Address  Phone  Notes  [facility] [place]  [place]  [phone]   [place] [address], [place] [phone] or  [phone]    Mobile Crisis Teams Organization         Address  Phone  Notes  [facility], Mobile Crisis Care Unit  [phone]   [facility]  [address] [place], [place]   [place] [address] [place] [phone]    Self-Help/Support Groups Organization         Address  Phone             Notes  Mental  Health Assoc. of [place] - variety of support groups  [place] Call for more information  Narcotics Anonymous (NA), [facility] [address], [employer] [place]  2 meetings at this location   Special educational needs teacher         Address  Phone  Notes  [facility] Residential Treatment [place],    [place]  [phone]   [facility]  [address] [place] [address], [place], [place]   [place], [place] [phone] Admissions: 8am-3pm M-F  Incentives Substance [place] [address],    [place], [place] [phone]   [facility] [address] [place], [place], [place]   [facility] [place] [facility] [address]  [place], [place]   [facility] Programs - Intensive Outpatient [address], [place], [place]   [facility] [place].,  [place], [place] [phone] or [phone]   [facility]idential Treatment Services (RTS) [address] [place], [place] Accepts Medicaid  [facility] [place] [address]  [place] [phone] Substance Abuse/Addiction Treatment   [facility] Organization         Address  Phone  Notes  [facility]  [phone]   Domenic Schwab, PhD [address] [place], [place]   [phone] or [phone]   [facility] [place], [place] [phone]   [facility] [address]  [place], [place] [phone] Insurance/Medicaid/sponsorship through [facility] and Families [address] [place] [address]                                    [place], [place] [phone] [place] [address], [place] [phone]    Dr. Adele Schilder  [phone]   [facility] Dept. 1) [address], [place] 2) [place] 3)  [place] [address], [place] [phone]  [phone]   [place] [phone] or [phone] (After Hours)

## 2015-03-11 NOTE — Care Management (Signed)
ED CM consulted for medication assistance. Patient was dischared yesterday. ED CM met with patient he states, his medications was called into [facility] on [place] but he does not have the $3 co-pay patient has Medicaid. ED CM contacted [facility],Pharmacist states she will waive the co-pay. Patient made aware that he can go tonight up unitl 11:45. L. Mathews [place]  and Valarie Merino  RN on [address] made aware. Patient also reminded to keep follow up appointment with Dr. Peggye Ley, patient verbalized understanding. No further CM needs identified. Patient given a bus pass for transportation. No further ED CM needs identified.

## 2015-03-11 NOTE — ED Notes (Signed)
Pt just dc from hospital for cp/sob. Pt reports being homeless and walking a lot. Now having pain to ankles and swelling. Hx of chf, pt has not filled his meds since being discharged and has sob on exertion. No acute distress noted at this time.

## 2015-03-11 NOTE — ED Provider Notes (Addendum)
CSN: [id number]     Arrival date & time 03/11/15  1602 History   First MD Initiated Contact with Patient 03/11/15 1719     Chief Complaint  Patient presents with  . Leg Pain   (Consider location/radiation/quality/duration/timing/severity/associated sxs/prior Treatment) Patient is a 59 y.o. male presenting with leg pain. The history is provided by the patient.  Leg Pain Location:  Ankle Time since incident:  2 days Injury: no   Ankle location:  L ankle and R ankle Pain details:    Quality:  Sharp   Severity:  Moderate   Onset quality:  Gradual   Progression:  Worsening Chronicity:  New Dislocation: no   Prior injury to area:  No   Past Medical History  Diagnosis Date  . Hypertension   . Homelessness   . Urinary hesitancy   . Hypercholesterolemia   . NSTEMI (non-ST elevated myocardial infarction) 01/07/2015  . Heart attack 07/2014  . Walking pneumonia 07/2014  . Headache     "probably weekly" (01/07/2015)  . Arthritis     "all over" (01/07/2015)  . Anxiety   . Depression   . Noncompliance   . Polysubstance abuse     etoh, cocaine  . Ischemic dilated cardiomyopathy   . CKD (chronic kidney disease), stage IV   . History of echocardiogram 12/2014    EF 20-25%   Past Surgical History  Procedure Laterality Date  . Left heart catheterization with coronary angiogram N/A 08/15/2014    Procedure: LEFT HEART CATHETERIZATION WITH CORONARY ANGIOGRAM;  Surgeon: Clent Demark, MD;  [facility];  Service: Cardiovascular;  Laterality: N/A;  . Cardiac catheterization     No family history on file. Social History  Substance Use Topics  . Smoking status: Former Smoker -- 1.50 packs/day for 30 years    Types: Cigarettes    Quit date: 02/19/2004  . Smokeless tobacco: Never Used  . Alcohol Use: 2.4 oz/week    4 Cans of beer per week    Review of Systems  Constitutional: Negative.   Musculoskeletal: Positive for joint swelling and gait problem.  Skin: Negative.      Allergies  Review of patient's allergies indicates no known allergies.  Home Medications   Prior to Admission medications   Medication Sig Start Date End Date Taking? Authorizing Provider  aspirin EC 81 MG tablet Take 81 mg by mouth daily.    Historical Provider, MD  atorvastatin (LIPITOR) 20 MG tablet Take 1 tablet (20 mg total) by mouth daily. Patient not taking: Reported on 03/01/2015 09/07/14   Charolette Forward, MD  carvedilol (COREG) 6.25 MG tablet Take 1 tablet (6.25 mg total) by mouth 2 (two) times daily with a meal. Patient not taking: Reported on 03/01/2015 09/07/14   Charolette Forward, MD  clopidogrel (PLAVIX) 75 MG tablet Take 1 tablet (75 mg total) by mouth daily. Patient not taking: Reported on 03/01/2015 01/09/15   Charolette Forward, MD  digoxin (LANOXIN) 0.25 MG tablet Take 1 tablet (0.25 mg total) by mouth daily. Patient not taking: Reported on 03/01/2015 09/07/14   Charolette Forward, MD  furosemide (LASIX) 40 MG tablet Take 1 tablet (40 mg total) by mouth 2 (two) times daily. Patient not taking: Reported on 03/01/2015 09/07/14   Charolette Forward, MD  isosorbide-hydrALAZINE (BIDIL) 20-37.5 MG per tablet Take 1 tablet by mouth 2 (two) times daily. 03/10/15   Charolette Forward, MD  nitroGLYCERIN (NITROSTAT) 0.4 MG SL tablet Place 1 tablet (0.4 mg total) under the tongue every  5 (five) minutes x 3 doses as needed for chest pain. 08/16/14   Dixie Dials, MD   BP 152/103 mmHg  Pulse 111  Temp(Src) 100.3 F (37.9 C) (Oral)  Resp 20  SpO2 96% Physical Exam  Constitutional: He is oriented to person, place, and time. He appears well-developed and well-nourished.  Cardiovascular: Regular rhythm.  Tachycardia present.  Exam reveals distant heart sounds and decreased pulses.   Pulmonary/Chest: Effort normal and breath sounds normal.  Musculoskeletal: He exhibits tenderness.  Neurological: He is alert and oriented to person, place, and time.  Skin: Skin is warm and dry.  Nursing note and vitals  reviewed.   ED Course  Procedures (including critical care time) Labs Review Labs Reviewed - No data to display  Imaging Review No results found.   MDM   1. Acute ankle pain, unspecified laterality    Pt sent for eval of ankle pain not addressed during hosp, , limited ability to ambulate, poss gout, has not taken meds recently rx'd with h/o chf.     Billy Fischer, MD 03/11/15 1737  Billy Fischer, MD 03/11/15 2031

## 2015-03-11 NOTE — ED Provider Notes (Signed)
CSN: [id number]     Arrival date & time 03/11/15  1803 History   First MD Initiated Contact with Patient 03/11/15 2000     Chief Complaint  Patient presents with  . Ankle Pain  . Shortness of Breath     (Consider location/radiation/quality/duration/timing/severity/associated sxs/prior Treatment) Patient is a 59 y.o. male presenting with ankle pain and shortness of breath. The history is provided by the patient and medical records.  Ankle Pain Shortness of Breath   This is a 59 year old male with history of hypertension, hyperlipidemia, depression, polysubstance abuse, ischemic cardiomyopathy, chronic kidney disease, presenting to the ED for left ankle pain. Patient was evaluated at urgent care and sent here for further evaluation. Patient is homeless and states he has been having left ankle pain for the past week. He was recently hospitalized for CHF exacerbation and was released yesterday. He states he has no chest pain or shortness of breath currently.  No reported fever or chills. Patient states given that he is homeless he does walk for several hours throughout the day. He has no known history of gout. He states his diet is very poor, he does consume alcohol regularly.  VSS.  Past Medical History  Diagnosis Date  . Hypertension   . Homelessness   . Urinary hesitancy   . Hypercholesterolemia   . NSTEMI (non-ST elevated myocardial infarction) 01/07/2015  . Heart attack 07/2014  . Walking pneumonia 07/2014  . Headache     "probably weekly" (01/07/2015)  . Arthritis     "all over" (01/07/2015)  . Anxiety   . Depression   . Noncompliance   . Polysubstance abuse     etoh, cocaine  . Ischemic dilated cardiomyopathy   . CKD (chronic kidney disease), stage IV   . History of echocardiogram 12/2014    EF 20-25%   Past Surgical History  Procedure Laterality Date  . Left heart catheterization with coronary angiogram N/A 08/15/2014    Procedure: LEFT HEART CATHETERIZATION WITH CORONARY  ANGIOGRAM;  Surgeon: Clent Demark, MD;  Location: [facility] CATH LAB;  Service: Cardiovascular;  Laterality: N/A;  . Cardiac catheterization     History reviewed. No pertinent family history. Social History  Substance Use Topics  . Smoking status: Former Smoker -- 1.50 packs/day for 30 years    Types: Cigarettes    Quit date: 02/19/2004  . Smokeless tobacco: Never Used  . Alcohol Use: 2.4 oz/week    4 Cans of beer per week    Review of Systems  Musculoskeletal: Positive for arthralgias.  All other systems reviewed and are negative.     Allergies  Review of patient's allergies indicates no known allergies.  Home Medications   Prior to Admission medications   Medication Sig Start Date End Date Taking? Authorizing Provider  aspirin EC 81 MG tablet Take 81 mg by mouth daily.    Historical Provider, MD  atorvastatin (LIPITOR) 20 MG tablet Take 1 tablet (20 mg total) by mouth daily. Patient not taking: Reported on 03/01/2015 09/07/14   Charolette Forward, MD  carvedilol (COREG) 6.25 MG tablet Take 1 tablet (6.25 mg total) by mouth 2 (two) times daily with a meal. Patient not taking: Reported on 03/01/2015 09/07/14   Charolette Forward, MD  clopidogrel (PLAVIX) 75 MG tablet Take 1 tablet (75 mg total) by mouth daily. Patient not taking: Reported on 03/01/2015 01/09/15   Charolette Forward, MD  digoxin (LANOXIN) 0.25 MG tablet Take 1 tablet (0.25 mg total) by mouth daily. Patient not taking:  Reported on 03/01/2015 09/07/14   Charolette Forward, MD  furosemide (LASIX) 40 MG tablet Take 1 tablet (40 mg total) by mouth 2 (two) times daily. Patient not taking: Reported on 03/01/2015 09/07/14   Charolette Forward, MD  isosorbide-hydrALAZINE (BIDIL) 20-37.5 MG per tablet Take 1 tablet by mouth 2 (two) times daily. 03/10/15   Charolette Forward, MD  nitroGLYCERIN (NITROSTAT) 0.4 MG SL tablet Place 1 tablet (0.4 mg total) under the tongue every 5 (five) minutes x 3 doses as needed for chest pain. 08/16/14   Dixie Dials, MD   BP 142/95  mmHg  Pulse 109  Temp(Src) 98.8 F (37.1 C) (Oral)  Resp 18  SpO2 100%   Physical Exam  Constitutional: He is oriented to person, place, and time. He appears well-developed and well-nourished. No distress.  HENT:  Head: Normocephalic and atraumatic.  Mouth/Throat: Oropharynx is clear and moist.  Eyes: Conjunctivae and EOM are normal. Pupils are equal, round, and reactive to light.  Neck: Normal range of motion. Neck supple.  Cardiovascular: Normal rate, regular rhythm and normal heart sounds.   Pulmonary/Chest: Effort normal and breath sounds normal. No respiratory distress. He has no wheezes.  Respirations unlabored, no distress, lungs clear bilaterally, O2 sats 99% on RA  Abdominal: Soft. Bowel sounds are normal. There is no tenderness. There is no guarding.  Musculoskeletal: Normal range of motion.  Left ankle with generalized swelling and tenderness, worse along medial aspect; no acute bony deformity; warmth to touch noted without overlying skin changes; no skin crepitus; no open wounds/ulcers; foot is NVI  Neurological: He is alert and oriented to person, place, and time.  Skin: Skin is warm and dry. He is not diaphoretic.  Psychiatric: He has a normal mood and affect.  Nursing note and vitals reviewed.   ED Course  Procedures (including critical care time) Labs Review Labs Reviewed  URIC ACID - Abnormal; Notable for the following:    Uric Acid, Serum 8.7 (*)    All other components within normal limits    Imaging Review Dg Ankle Complete Left  03/11/2015   CLINICAL DATA:  Left ankle pain and swelling for 2 days. No known injury. Question gout.  EXAM: LEFT ANKLE COMPLETE - 3+ VIEW  COMPARISON:  None.  FINDINGS: No fracture or dislocation. The alignment and joint spaces are maintained. Small well corticated osseous densities adjacent to the medial malleolus as well as cortical undulation suggestive of prior injury. No erosion or periosteal reaction. The ankle mortise is  preserved. Mild soft tissue edema most prominent medially. No radiopaque foreign bodies. Small plantar calcaneal spur.  IMPRESSION: 1. No acute bony abnormality or finding of inflammatory arthropathy. 2. Mild soft tissue edema most significant medially. 3. Probable remote prior injury to the medial ankle.   Electronically Signed   By: Jeb Levering M.D.   On: 03/11/2015 21:19   I have personally reviewed and evaluated these images and lab results as part of my medical decision-making.   EKG Interpretation None      MDM   Final diagnoses:  Acute gout of left ankle, unspecified cause   59 year old male here with left ankle pain. Patient adamantly denies any chest pain or shortness of breath. He was recently hospitalized for CHF exacerbation. Patient states he is only here for left ankle pain. He is afebrile, nontoxic. He does have generalized swelling about the left ankle and some warmth to touch. Range of motion maintained, some pain noted. No overlying skin changes or evidence of  cellulitis. Given his history of EtOH use, do have concern for gout. Patient will consent to x-ray of left ankle and uric acid, however will not consent to any further workup in regards to chest pain or shortness of breath as he states "i just had all that done, they said i was fine".   Again, patient very adamant that he does not have CP/SOB at present.  Ankle films negative for acute bony findings. Uric acid is elevated at 8.7. I suspect his symptoms are due to gout which has likely been exacerbated by his alcohol use. Lower suspicion for septic joint.  Patient continues to deny any chest pain or shortness of breath. Patient has stage IV chronic kidney disease, therefore will not start on NSAIDs. Patient will be started on course of prednisone. Case management has also met with patient, his medications are ready for pickup at [facility].  Patient given bus passes so that he may go pick up medications.  Patient is very  appreciative of his medical care today.  Encouraged to FU with PCP.  Discussed plan with patient, he/she acknowledged understanding and agreed with plan of care.  Return precautions given for new or worsening symptoms.  Larene Pickett, PA-C 03/11/15 [place], DO 03/13/15 1400

## 2015-03-11 NOTE — ED Notes (Signed)
Pt  Released  From  [place]            Did  Not    Get  meds  Filled  He       Stated       Pt         Has  A  History  Of  chf     And  His  Blood  pressrure  Is  High  At this  Time          He  States         Gets  Short  Of  Breath    On  Exertion

## 2015-03-11 NOTE — ED Notes (Signed)
Patient denies any SOB states " my ankle just hurts. "

## 2015-03-11 NOTE — ED Notes (Signed)
Pt refuses having EKG done at triage for ankle swelling, states he only wants to be seen today for the pain.

## 2015-03-15 ENCOUNTER — Emergency Department (HOSPITAL_COMMUNITY)
Admission: EM | Admit: 2015-03-15 | Discharge: 2015-03-15 | Disposition: A | Discharge status: Home / Self Care | Payer: Medicaid Other | Attending: Emergency Medicine | Admitting: Emergency Medicine

## 2015-03-15 ENCOUNTER — Emergency Department (HOSPITAL_BASED_OUTPATIENT_CLINIC_OR_DEPARTMENT_OTHER)
Admit: 2015-03-15 | Discharge: 2015-03-15 | Disposition: A | Discharge status: Home / Self Care | Payer: Medicaid Other | Attending: Emergency Medicine | Admitting: Emergency Medicine

## 2015-03-15 ENCOUNTER — Encounter (HOSPITAL_COMMUNITY): Payer: Self-pay | Admitting: Nurse Practitioner

## 2015-03-15 DIAGNOSIS — F419 Anxiety disorder, unspecified: Secondary | ICD-10-CM | POA: Diagnosis not present

## 2015-03-15 DIAGNOSIS — Z8701 Personal history of pneumonia (recurrent): Secondary | ICD-10-CM | POA: Insufficient documentation

## 2015-03-15 DIAGNOSIS — Z9119 Patient's noncompliance with other medical treatment and regimen: Secondary | ICD-10-CM | POA: Diagnosis not present

## 2015-03-15 DIAGNOSIS — M1990 Unspecified osteoarthritis, unspecified site: Secondary | ICD-10-CM | POA: Diagnosis not present

## 2015-03-15 DIAGNOSIS — M10072 Idiopathic gout, left ankle and foot: Secondary | ICD-10-CM | POA: Diagnosis not present

## 2015-03-15 DIAGNOSIS — N184 Chronic kidney disease, stage 4 (severe): Secondary | ICD-10-CM | POA: Diagnosis not present

## 2015-03-15 DIAGNOSIS — I252 Old myocardial infarction: Secondary | ICD-10-CM | POA: Diagnosis not present

## 2015-03-15 DIAGNOSIS — Z7982 Long term (current) use of aspirin: Secondary | ICD-10-CM | POA: Insufficient documentation

## 2015-03-15 DIAGNOSIS — Z590 Homelessness: Secondary | ICD-10-CM | POA: Insufficient documentation

## 2015-03-15 DIAGNOSIS — I129 Hypertensive chronic kidney disease with stage 1 through stage 4 chronic kidney disease, or unspecified chronic kidney disease: Secondary | ICD-10-CM | POA: Insufficient documentation

## 2015-03-15 DIAGNOSIS — M7989 Other specified soft tissue disorders: Secondary | ICD-10-CM

## 2015-03-15 DIAGNOSIS — R609 Edema, unspecified: Secondary | ICD-10-CM

## 2015-03-15 DIAGNOSIS — F329 Major depressive disorder, single episode, unspecified: Secondary | ICD-10-CM | POA: Diagnosis not present

## 2015-03-15 DIAGNOSIS — Z79899 Other long term (current) drug therapy: Secondary | ICD-10-CM | POA: Insufficient documentation

## 2015-03-15 DIAGNOSIS — Z87891 Personal history of nicotine dependence: Secondary | ICD-10-CM | POA: Insufficient documentation

## 2015-03-15 DIAGNOSIS — M109 Gout, unspecified: Secondary | ICD-10-CM

## 2015-03-15 DIAGNOSIS — M25572 Pain in left ankle and joints of left foot: Secondary | ICD-10-CM | POA: Diagnosis present

## 2015-03-15 DIAGNOSIS — R339 Retention of urine, unspecified: Secondary | ICD-10-CM | POA: Diagnosis not present

## 2015-03-15 LAB — BASIC METABOLIC PANEL
Anion gap: 7 (ref 5–15)
BUN: 22 mg/dL — AB (ref 6–20)
CHLORIDE: 101 mmol/L (ref 101–111)
CO2: 31 mmol/L (ref 22–32)
CREATININE: 1.81 mg/dL — AB (ref 0.61–1.24)
Calcium: 9.6 mg/dL (ref 8.9–10.3)
GFR calc Af Amer: 45 mL/min — ABNORMAL LOW (ref >60)
GFR calc non Af Amer: 39 mL/min — ABNORMAL LOW (ref >60)
Glucose, Bld: 82 mg/dL (ref 65–99)
Potassium: 4.2 mmol/L (ref 3.5–5.1)
Sodium: 139 mmol/L (ref 135–145)

## 2015-03-15 LAB — CBC WITH DIFFERENTIAL/PLATELET
Basophils Absolute: 0 10*3/uL (ref 0.0–0.1)
Basophils Relative: 1 % (ref 0–1)
Eosinophils Absolute: 0.1 10*3/uL (ref 0.0–0.7)
Eosinophils Relative: 3 % (ref 0–5)
HEMATOCRIT: 41.8 % (ref 39.0–52.0)
HEMOGLOBIN: 14 g/dL (ref 13.0–17.0)
LYMPHS ABS: 1.5 10*3/uL (ref 0.7–4.0)
LYMPHS PCT: 34 % (ref 12–46)
MCH: 34.7 pg — AB (ref 26.0–34.0)
MCHC: 33.5 g/dL (ref 30.0–36.0)
MCV: 103.5 fL — AB (ref 78.0–100.0)
MONOS PCT: 12 % (ref 3–12)
Monocytes Absolute: 0.5 10*3/uL (ref 0.1–1.0)
NEUTROS ABS: 2.3 10*3/uL (ref 1.7–7.7)
NEUTROS PCT: 50 % (ref 43–77)
Platelets: 254 10*3/uL (ref 150–400)
RBC: 4.04 MIL/uL — ABNORMAL LOW (ref 4.22–5.81)
RDW: 13.3 % (ref 11.5–15.5)
WBC: 4.5 10*3/uL (ref 4.0–10.5)

## 2015-03-15 LAB — URINALYSIS, ROUTINE W REFLEX MICROSCOPIC
BILIRUBIN URINE: NEGATIVE
Glucose, UA: NEGATIVE mg/dL
Hgb urine dipstick: NEGATIVE
Ketones, ur: NEGATIVE mg/dL
Leukocytes, UA: NEGATIVE
NITRITE: NEGATIVE
PH: 6.5 (ref 5.0–8.0)
Protein, ur: NEGATIVE mg/dL
SPECIFIC GRAVITY, URINE: 1.013 (ref 1.005–1.030)
Urobilinogen, UA: 4 mg/dL — ABNORMAL HIGH (ref 0.0–1.0)

## 2015-03-15 LAB — PROTIME-INR
INR: 1.07 (ref 0.00–1.49)
Prothrombin Time: 14.1 seconds (ref 11.6–15.2)

## 2015-03-15 MED ORDER — PREDNISONE 20 MG PO TABS: 40.0000 mg | ORAL_TABLET | Freq: Every day | ORAL | Status: DC

## 2015-03-15 MED ORDER — TRAMADOL HCL 50 MG PO TABS: 50.0000 mg | ORAL_TABLET | Freq: Two times a day (BID) | ORAL | Status: DC | PRN

## 2015-03-15 NOTE — ED Notes (Addendum)
Pt was here last week for gout and has been trying to take the medication they gave him but swelling and pain persists. He has also been elevating the extremity when able. He denies any injuries to the extremity.  He is homeless and has visual impairment. He also reports difficulty urinating, trouble starting to void,  painful urination for past 6 months.

## 2015-03-15 NOTE — Discharge Instructions (Signed)

## 2015-03-15 NOTE — Progress Notes (Signed)
VASCULAR LAB PRELIMINARY  PRELIMINARY  PRELIMINARY  PRELIMINARY  Left lower extremity venous duplex completed.    Preliminary report:  There is no DVT or SVT noted in the left lower extremity.  Daneli Butkiewicz, RVT 03/15/2015, 2:22 PM

## 2015-03-15 NOTE — ED Provider Notes (Signed)
CSN: [id number]     Arrival date & time 03/15/15  1150 History   First MD Initiated Contact with Patient 03/15/15 1208     Chief Complaint  Patient presents with  . Ankle Pain  . Urinary Retention   [name] is a 59 y.o. male with a history of polysubstance abuse, homelessness, hypertension, hyperlipidemia, cardiomyopathy, chronic kidney disease who presents to the emergency department complaining of left ankle and foot pain and swelling. Patient reports he has been having this pain and swelling ongoing for about the past week and a half now. He reports the swelling has worsened and now not just includes his ankle but also his leg. He reports he was given a gout medicine 2 days ago in the emergency department which has not helped his swelling. He reports he has not been taking prednisone.  He reports he has no pain without movement but has some pain with movement. He denies current pain. He reports he has not been drinking alcohol in the past 3 weeks. He denies history of seizures. He reports he also has been having trouble initiating his stream of urine and has some dysuria over the past 6 months. He reports that he stays at [facility]. The patient denies fevers, chills, chest pain, shortness of breath, abdominal pain, nausea, vomiting, numbness, tingling, weakness, hematuria, penile discharge, testicular pain. He denies personal or) history of DVTs or PEs. He denies recent surgeries or recent long travel.  (Consider location/radiation/quality/duration/timing/severity/associated sxs/prior Treatment) HPI  Past Medical History  Diagnosis Date  . Hypertension   . Homelessness   . Urinary hesitancy   . Hypercholesterolemia   . NSTEMI (non-ST elevated myocardial infarction) 01/07/2015  . Heart attack 07/2014  . Walking pneumonia 07/2014  . Headache     "probably weekly" (01/07/2015)  . Arthritis     "all over" (01/07/2015)  . Anxiety   . Depression   . Noncompliance   . Polysubstance  abuse     etoh, cocaine  . Ischemic dilated cardiomyopathy   . CKD (chronic kidney disease), stage IV   . History of echocardiogram 12/2014    EF 20-25%   Past Surgical History  Procedure Laterality Date  . Left heart catheterization with coronary angiogram N/A 08/15/2014    Procedure: LEFT HEART CATHETERIZATION WITH CORONARY ANGIOGRAM;  Surgeon: Clent Demark, MD;  Location: [facility] CATH LAB;  Service: Cardiovascular;  Laterality: N/A;  . Cardiac catheterization     History reviewed. No pertinent family history. Social History  Substance Use Topics  . Smoking status: Former Smoker -- 1.50 packs/day for 30 years    Types: Cigarettes    Quit date: 02/19/2004  . Smokeless tobacco: Never Used  . Alcohol Use: No    Review of Systems  Constitutional: Negative for fever and chills.  HENT: Negative for congestion and sore throat.   Eyes: Negative for visual disturbance.  Respiratory: Negative for cough, shortness of breath and wheezing.   Cardiovascular: Positive for leg swelling. Negative for chest pain and palpitations.  Gastrointestinal: Negative for nausea, vomiting, abdominal pain and diarrhea.  Genitourinary: Positive for difficulty urinating. Negative for dysuria, frequency, hematuria, flank pain, decreased urine volume, discharge, penile swelling, scrotal swelling, genital sores, penile pain and testicular pain.  Musculoskeletal: Positive for joint swelling and arthralgias. Negative for back pain and neck pain.  Skin: Negative for color change and rash.  Neurological: Negative for light-headedness and headaches.      Allergies  Review of patient's allergies indicates no  known allergies.  Home Medications   Prior to Admission medications   Medication Sig Start Date End Date Taking? Authorizing Provider  aspirin EC 81 MG tablet Take 81 mg by mouth daily.    Historical Provider, MD  atorvastatin (LIPITOR) 20 MG tablet Take 1 tablet (20 mg total) by mouth daily. Patient not  taking: Reported on 03/01/2015 09/07/14   Charolette Forward, MD  carvedilol (COREG) 6.25 MG tablet Take 1 tablet (6.25 mg total) by mouth 2 (two) times daily with a meal. Patient not taking: Reported on 03/01/2015 09/07/14   Charolette Forward, MD  clopidogrel (PLAVIX) 75 MG tablet Take 1 tablet (75 mg total) by mouth daily. Patient not taking: Reported on 03/01/2015 01/09/15   Charolette Forward, MD  digoxin (LANOXIN) 0.25 MG tablet Take 1 tablet (0.25 mg total) by mouth daily. Patient not taking: Reported on 03/01/2015 09/07/14   Charolette Forward, MD  furosemide (LASIX) 40 MG tablet Take 1 tablet (40 mg total) by mouth 2 (two) times daily. Patient not taking: Reported on 03/01/2015 09/07/14   Charolette Forward, MD  isosorbide-hydrALAZINE (BIDIL) 20-37.5 MG per tablet Take 1 tablet by mouth 2 (two) times daily. 03/10/15   Charolette Forward, MD  nitroGLYCERIN (NITROSTAT) 0.4 MG SL tablet Place 1 tablet (0.4 mg total) under the tongue every 5 (five) minutes x 3 doses as needed for chest pain. 08/16/14   Dixie Dials, MD  predniSONE (DELTASONE) 20 MG tablet Take 2 tablets (40 mg total) by mouth daily. 03/15/15   Waynetta Pean, PA-C  traMADol (ULTRAM) 50 MG tablet Take 1 tablet (50 mg total) by mouth every 12 (twelve) hours as needed. 03/15/15   Waynetta Pean, PA-C   BP 123/72 mmHg  Pulse 97  Temp(Src) 98.9 F (37.2 C) (Oral)  Resp 17  SpO2 97% Physical Exam  Constitutional: He appears well-developed and well-nourished. No distress.  Nontoxic appearing.  HENT:  Head: Normocephalic and atraumatic.  Mouth/Throat: Oropharynx is clear and moist.  Eyes: Conjunctivae are normal. Pupils are equal, round, and reactive to light. Right eye exhibits no discharge. Left eye exhibits no discharge.  Neck: Neck supple.  Cardiovascular: Normal rate, regular rhythm, normal heart sounds and intact distal pulses.  Exam reveals no gallop and no friction rub.   No murmur heard. Pulmonary/Chest: Effort normal and breath sounds normal. No  respiratory distress. He has no wheezes. He has no rales.  Abdominal: Soft. Bowel sounds are normal. He exhibits no distension. There is no tenderness. There is no guarding.  Abdomen is soft and nontender to palpation.  Musculoskeletal: Normal range of motion. He exhibits edema and tenderness.  Patient has left ankle, pedal and leg edema. He has calf tenderness to palpation. He has good range of motion of his left ankle. No erythema or warmth.  Lymphadenopathy:    He has no cervical adenopathy.  Neurological: He is alert. Coordination normal.  Skin: Skin is warm and dry. No rash noted. He is not diaphoretic. No erythema. No pallor.  Psychiatric: He has a normal mood and affect. His behavior is normal.  Nursing note and vitals reviewed.   ED Course  Procedures (including critical care time) Labs Review Labs Reviewed  URINALYSIS, ROUTINE W REFLEX MICROSCOPIC (NOT AT [facility]) - Abnormal; Notable for the following:    Urobilinogen, UA 4.0 (*)    All other components within normal limits  CBC WITH DIFFERENTIAL/PLATELET - Abnormal; Notable for the following:    RBC 4.04 (*)    MCV 103.5 (*)  MCH 34.7 (*)    All other components within normal limits  BASIC METABOLIC PANEL - Abnormal; Notable for the following:    BUN 22 (*)    Creatinine, Ser 1.81 (*)    GFR calc non Af Amer 39 (*)    GFR calc Af Amer 45 (*)    All other components within normal limits  PROTIME-INR    Imaging Review No results found. I have personally reviewed and evaluated these images and lab results as part of my medical decision-making.   EKG Interpretation None      Filed Vitals:   03/15/15 1230 03/15/15 1245 03/15/15 1526 03/15/15 1719  BP: 109/70 105/74 114/72 123/72  Pulse: 99 102 95 97  Temp:      TempSrc:      Resp:   18 17  SpO2: 100% 100% 99% 97%     MDM   Meds given in ED:  Medications - No data to display  Discharge Medication List as of 03/15/2015  5:13 PM    START taking these  medications   Details  traMADol (ULTRAM) 50 MG tablet Take 1 tablet (50 mg total) by mouth every 12 (twelve) hours as needed., Starting 03/15/2015, Until Discontinued, Print        Final diagnoses:  Acute gout of left ankle, unspecified cause  Leg swelling   This is a 59 y.o. male with a history of polysubstance abuse, homelessness, hypertension, hyperlipidemia, cardiomyopathy, chronic kidney disease who presents to the emergency department complaining of left ankle and foot pain and swelling. Patient reports he has been having this pain and swelling ongoing for about the past week and a half now. He reports the swelling has worsened and now not just includes his ankle but also his leg. He reports he was given a gout medicine 2 days ago in the emergency department which has not helped his swelling. He reports he has not been taking prednisone.  He reports he has no pain without movement but has some pain with movement. He denies current pain. He reports he has not been drinking alcohol in the past 3 weeks. He denies history of seizures. He denies chest pain or shortness of breath.  On exam the patient is afebrile nontoxic appearing. Patient's abdomen is soft and nontender to palpation. The patient does have moderate left ankle and pedal edema as well as some mild lower leg edema. He has calf tenderness with palpation. There is no erythema or warmth of his leg or ankle. He has good range of motion of his left ankle. No concern for septic joint. Patient had a left ankle x-ray which was negative for acute bony abnormality 2 days ago. The patient also had an elevated uric acid level II days ago. I suspect gout, however I feel there is a need to rule out DVT with his increased swelling to his leg and calf tenderness today. DVT study was negative for DVT. Patient's CBC is unremarkable. His BMP shows a creatinine of 1.81 which is around his baseline. His urinalysis is negative for infection.  As the patient is  unable to take NSAIDs due to his creatinine clearance will provide the patient with a small amount of tramadol and start him on prednisone for acute gout flare. Education on diet for gout provided. Encourage the patient to have close follow-up with her primary care provider for further evaluation of his difficulty in initiating his urine stream. Strict return precautions provided. I advised the patient to follow-up  with their primary care provider this week. I advised the patient to return to the emergency department with new or worsening symptoms or new concerns. The patient verbalized understanding and agreement with plan.    This patient was discussed with Dr. Tamera Punt who agrees with assessment and plan.   Waynetta Pean, PA-C 03/15/15 [place], MD 03/16/15 [phone]

## 2015-03-20 ENCOUNTER — Emergency Department (HOSPITAL_COMMUNITY)
Admission: EM | Admit: 2015-03-20 | Discharge: 2015-03-20 | Disposition: A | Discharge status: Home / Self Care | Payer: Medicaid Other | Attending: Emergency Medicine | Admitting: Emergency Medicine

## 2015-03-20 ENCOUNTER — Encounter (HOSPITAL_COMMUNITY): Payer: Self-pay | Admitting: *Deleted

## 2015-03-20 ENCOUNTER — Emergency Department (HOSPITAL_COMMUNITY): Payer: Medicaid Other

## 2015-03-20 DIAGNOSIS — Z8701 Personal history of pneumonia (recurrent): Secondary | ICD-10-CM | POA: Insufficient documentation

## 2015-03-20 DIAGNOSIS — R0981 Nasal congestion: Secondary | ICD-10-CM | POA: Insufficient documentation

## 2015-03-20 DIAGNOSIS — R1110 Vomiting, unspecified: Secondary | ICD-10-CM | POA: Diagnosis not present

## 2015-03-20 DIAGNOSIS — Z590 Homelessness: Secondary | ICD-10-CM | POA: Diagnosis not present

## 2015-03-20 DIAGNOSIS — Z9119 Patient's noncompliance with other medical treatment and regimen: Secondary | ICD-10-CM | POA: Insufficient documentation

## 2015-03-20 DIAGNOSIS — I252 Old myocardial infarction: Secondary | ICD-10-CM | POA: Diagnosis not present

## 2015-03-20 DIAGNOSIS — K1121 Acute sialoadenitis: Secondary | ICD-10-CM | POA: Diagnosis not present

## 2015-03-20 DIAGNOSIS — M1990 Unspecified osteoarthritis, unspecified site: Secondary | ICD-10-CM | POA: Insufficient documentation

## 2015-03-20 DIAGNOSIS — E780 Pure hypercholesterolemia: Secondary | ICD-10-CM | POA: Diagnosis not present

## 2015-03-20 DIAGNOSIS — Z9889 Other specified postprocedural states: Secondary | ICD-10-CM | POA: Insufficient documentation

## 2015-03-20 DIAGNOSIS — R599 Enlarged lymph nodes, unspecified: Secondary | ICD-10-CM | POA: Diagnosis present

## 2015-03-20 DIAGNOSIS — F419 Anxiety disorder, unspecified: Secondary | ICD-10-CM | POA: Diagnosis not present

## 2015-03-20 DIAGNOSIS — I129 Hypertensive chronic kidney disease with stage 1 through stage 4 chronic kidney disease, or unspecified chronic kidney disease: Secondary | ICD-10-CM | POA: Diagnosis not present

## 2015-03-20 DIAGNOSIS — F329 Major depressive disorder, single episode, unspecified: Secondary | ICD-10-CM | POA: Diagnosis not present

## 2015-03-20 DIAGNOSIS — Z7982 Long term (current) use of aspirin: Secondary | ICD-10-CM | POA: Insufficient documentation

## 2015-03-20 DIAGNOSIS — N184 Chronic kidney disease, stage 4 (severe): Secondary | ICD-10-CM | POA: Diagnosis not present

## 2015-03-20 DIAGNOSIS — Z720 Tobacco use: Secondary | ICD-10-CM | POA: Diagnosis not present

## 2015-03-20 LAB — CBC WITH DIFFERENTIAL/PLATELET
BASOS ABS: 0 10*3/uL (ref 0.0–0.1)
BASOS PCT: 1 % (ref 0–1)
Eosinophils Absolute: 0 10*3/uL (ref 0.0–0.7)
Eosinophils Relative: 1 % (ref 0–5)
HEMATOCRIT: 46 % (ref 39.0–52.0)
HEMOGLOBIN: 15.3 g/dL (ref 13.0–17.0)
LYMPHS PCT: 46 % (ref 12–46)
Lymphs Abs: 2.8 10*3/uL (ref 0.7–4.0)
MCH: 34.7 pg — ABNORMAL HIGH (ref 26.0–34.0)
MCHC: 33.3 g/dL (ref 30.0–36.0)
MCV: 104.3 fL — AB (ref 78.0–100.0)
Monocytes Absolute: 0.3 10*3/uL (ref 0.1–1.0)
Monocytes Relative: 6 % (ref 3–12)
NEUTROS ABS: 2.7 10*3/uL (ref 1.7–7.7)
NEUTROS PCT: 46 % (ref 43–77)
Platelets: 350 10*3/uL (ref 150–400)
RBC: 4.41 MIL/uL (ref 4.22–5.81)
RDW: 13.2 % (ref 11.5–15.5)
WBC: 5.9 10*3/uL (ref 4.0–10.5)

## 2015-03-20 LAB — BASIC METABOLIC PANEL
ANION GAP: 8 (ref 5–15)
BUN: 28 mg/dL — ABNORMAL HIGH (ref 6–20)
CALCIUM: 9.7 mg/dL (ref 8.9–10.3)
CO2: 27 mmol/L (ref 22–32)
Chloride: 106 mmol/L (ref 101–111)
Creatinine, Ser: 1.37 mg/dL — ABNORMAL HIGH (ref 0.61–1.24)
GFR, EST NON AFRICAN AMERICAN: 55 mL/min — AB (ref >60)
Glucose, Bld: 101 mg/dL — ABNORMAL HIGH (ref 65–99)
POTASSIUM: 4.3 mmol/L (ref 3.5–5.1)
Sodium: 141 mmol/L (ref 135–145)

## 2015-03-20 MED ORDER — ALBUTEROL SULFATE HFA 108 (90 BASE) MCG/ACT IN AERS
1.0000 | INHALATION_SPRAY | Freq: Once | RESPIRATORY_TRACT | Status: AC
  Administered 2015-03-20: 1 via RESPIRATORY_TRACT
  Filled 2015-03-20: qty 6.7

## 2015-03-20 NOTE — ED Notes (Signed)
Bed: [id number] Expected date:  Expected time:  Means of arrival:  Comments: Neck swelling

## 2015-03-20 NOTE — ED Notes (Addendum)
Pt arrives to the ER via EMS for complaints of swollen lymph to his neck; pt states that he noticed it approx an hour ago while he was taking his "water pill"; pt denies recent illness or feeling sick; pt denies difficulty breathing or swallowing; pt with minimal puffiness noted to right side of neck / lower jaw area

## 2015-03-20 NOTE — ED Provider Notes (Signed)
CSN: [id number]     Arrival date & time 03/20/15  0223 History  This chart was scribed for Sherwood Gambler, MD by [name], ED Scribe. This patient was seen in room WA23/WA23 and the patient's care was started 2:46 AM.    Chief Complaint  Patient presents with  . Adenopathy   The history is provided by the patient. No language interpreter was used.    HPI Comments:  [name] is a 59 y.o. male who presents to the Emergency Department complaining of sudden onset swelling in his right neck that started about 40-50 minutes ago. Pt reports taking his water pill shortly before symptom onset and also notes he vomited once right before and then swelling started. He states the swelling improved since onset. Pt denies pain to the site, sore throat, difficulty swallowing and SOB. No alleviating factors noted. No dental pain.   Past Medical History  Diagnosis Date  . Hypertension   . Homelessness   . Urinary hesitancy   . Hypercholesterolemia   . NSTEMI (non-ST elevated myocardial infarction) 01/07/2015  . Heart attack 07/2014  . Walking pneumonia 07/2014  . Headache     "probably weekly" (01/07/2015)  . Arthritis     "all over" (01/07/2015)  . Anxiety   . Depression   . Noncompliance   . Polysubstance abuse     etoh, cocaine  . Ischemic dilated cardiomyopathy   . CKD (chronic kidney disease), stage IV   . History of echocardiogram 12/2014    EF 20-25%   Past Surgical History  Procedure Laterality Date  . Left heart catheterization with coronary angiogram N/A 08/15/2014    Procedure: LEFT HEART CATHETERIZATION WITH CORONARY ANGIOGRAM;  Surgeon: Clent Demark, MD;  Location: [facility] CATH LAB;  Service: Cardiovascular;  Laterality: N/A;  . Cardiac catheterization     No family history on file. Social History  Substance Use Topics  . Smoking status: Former Smoker -- 1.50 packs/day for 30 years    Types: Cigarettes    Quit date: 02/19/2004  . Smokeless tobacco: Never Used  . Alcohol  Use: No    Review of Systems  Constitutional: Negative for fever.  HENT: Positive for congestion. Negative for sore throat and trouble swallowing.   Respiratory: Negative for shortness of breath.   Gastrointestinal: Positive for vomiting.  Musculoskeletal: Negative for neck pain.  Hematological: Positive for adenopathy.  All other systems reviewed and are negative.   Allergies  Review of patient's allergies indicates no known allergies.  Home Medications   Prior to Admission medications   Medication Sig Start Date End Date Taking? Authorizing Provider  aspirin EC 81 MG tablet Take 81 mg by mouth daily.    Historical Provider, MD  atorvastatin (LIPITOR) 20 MG tablet Take 1 tablet (20 mg total) by mouth daily. Patient not taking: Reported on 03/01/2015 09/07/14   Charolette Forward, MD  carvedilol (COREG) 6.25 MG tablet Take 1 tablet (6.25 mg total) by mouth 2 (two) times daily with a meal. Patient not taking: Reported on 03/01/2015 09/07/14   Charolette Forward, MD  clopidogrel (PLAVIX) 75 MG tablet Take 1 tablet (75 mg total) by mouth daily. Patient not taking: Reported on 03/01/2015 01/09/15   Charolette Forward, MD  digoxin (LANOXIN) 0.25 MG tablet Take 1 tablet (0.25 mg total) by mouth daily. Patient not taking: Reported on 03/01/2015 09/07/14   Charolette Forward, MD  furosemide (LASIX) 40 MG tablet Take 1 tablet (40 mg total) by mouth 2 (two) times daily.  Patient not taking: Reported on 03/01/2015 09/07/14   Charolette Forward, MD  isosorbide-hydrALAZINE (BIDIL) 20-37.5 MG per tablet Take 1 tablet by mouth 2 (two) times daily. 03/10/15   Charolette Forward, MD  nitroGLYCERIN (NITROSTAT) 0.4 MG SL tablet Place 1 tablet (0.4 mg total) under the tongue every 5 (five) minutes x 3 doses as needed for chest pain. 08/16/14   Dixie Dials, MD  predniSONE (DELTASONE) 20 MG tablet Take 2 tablets (40 mg total) by mouth daily. 03/15/15   Waynetta Pean, PA-C  traMADol (ULTRAM) 50 MG tablet Take 1 tablet (50 mg total) by mouth every  12 (twelve) hours as needed. 03/15/15   Waynetta Pean, PA-C   BP 132/100 mmHg  Pulse 76  Temp(Src) 97.3 F (36.3 C) (Oral)  Resp 18  SpO2 99% Physical Exam  Constitutional: He is oriented to person, place, and time. He appears well-developed and well-nourished.  HENT:  Head: Normocephalic and atraumatic.  Right Ear: External ear normal.  Left Ear: External ear normal.  Nose: Nose normal.  No oropharyngeal swelling No floor of mouth swelling  Eyes: Right eye exhibits no discharge. Left eye exhibits no discharge.  Neck: Normal range of motion. Neck supple.  Mild right sided swelling just under mandible over submandibular gland; no tenderness; no crepitus FROM without pain  No suprclavicular lympadenopathy  Cardiovascular: Normal rate, regular rhythm, normal heart sounds and intact distal pulses.   Pulmonary/Chest: Effort normal and breath sounds normal.  Abdominal: He exhibits no distension.  Musculoskeletal: He exhibits no edema.  Neurological: He is alert and oriented to person, place, and time.  Skin: Skin is warm and dry.  Nursing note and vitals reviewed.   ED Course  Procedures   DIAGNOSTIC STUDIES:  Oxygen Saturation is 99% on RA, normal by my interpretation.    COORDINATION OF CARE:  2:51 AM Discussed treatment plan with pt at bedside and pt agreed to plan.  Labs Review Labs Reviewed  BASIC METABOLIC PANEL - Abnormal; Notable for the following:    Glucose, Bld 101 (*)    BUN 28 (*)    Creatinine, Ser 1.37 (*)    GFR calc non Af Amer 55 (*)    All other components within normal limits  CBC WITH DIFFERENTIAL/PLATELET - Abnormal; Notable for the following:    MCV 104.3 (*)    MCH 34.7 (*)    All other components within normal limits    Imaging Review Ct Soft Tissue Neck Wo Contrast  03/20/2015   CLINICAL DATA:  Initial evaluation for right-sided neck swelling.  EXAM: CT NECK WITHOUT CONTRAST  TECHNIQUE: Multidetector CT imaging of the neck was performed  following the standard protocol without intravenous contrast.  COMPARISON:  None.  FINDINGS: Visualized portions of the brain are unremarkable.  Chris enteric  Partial visualization of well-circumscribed someone cresenteric soft tissue lesion measuring 3.9 x 1.0 cm within the left occipital scalp (series 2, image 1), indeterminate.  No acute abnormality about the orbits.  Visualized paranasal sinuses and mastoid air cells are clear.  Parotid glands and left submandibular gland are normal. Right submandibular gland mildly enlarged and slightly edematous in appearance. Suggestion of mild stranding within the adjacent left submandibular space with shotty right level 1B low subcentimeter lymph nodes. Mild thickening of the overlying right platysmas. Finding suggestive of possible developing/early acute sialoadenitis. No obstructive radiopaque stone.  Oral cavity normal. Palatine tonsils symmetric and within normal limits. Parapharyngeal fat preserved. No retropharyngeal fluid collection. Epiglottis normal. Vallecula clear. Hypopharynx and  supraglottic larynx grossly normal on this noncontrast examination. Subglottic airway clear.  Thyroid gland normal.  No pathologically enlarged lymph nodes identified within the neck. At  Visualized superior mediastinum within normal limits.  Visualized lungs are clear.  No acute osseous abnormality. No worrisome lytic or blastic osseous lesions. Moderate multilevel degenerative spondylolysis present within the visualized spine.  IMPRESSION: 1. Asymmetric enlargement of the right submandibular gland with subtle surrounding hazy inflammatory stranding within the adjacent right submandibular space. Finding suggestive of possible acute sialoadenitis. No obstructive radiopaque calculus. 2. 3.9 x 1.0 cm cresenteric soft tissue lesion within the subcutaneous fat of the left occipital scalp, indeterminate, and incompletely visualized on this exam. Correlation with physical exam recommended.    Electronically Signed   By: Jeannine Boga M.D.   On: 03/20/2015 03:42   I have personally reviewed and evaluated these images and lab results as part of my medical decision-making.   EKG Interpretation None      MDM   Final diagnoses:  Acute sialoadenitis    Given his symptoms started after vomiting a CT was ordered to r/o acute injury or other pathology. Patient appears to have acute sialoadenitis. No erythema, fever, or elevated white blood cell count. Given his symptoms just started a do not feel he needs antibiotics but more supportive care. Discussed supportive care such as ice as well as tart candy. Discussed strict return precautions. Follow-up with PCP.  I personally performed the services described in this documentation, which was scribed in my presence. The recorded information has been reviewed and is accurate.    Sherwood Gambler, MD 03/20/15 [phone]

## 2015-03-22 ENCOUNTER — Encounter (HOSPITAL_COMMUNITY): Payer: Self-pay | Admitting: Emergency Medicine

## 2015-03-22 ENCOUNTER — Emergency Department (HOSPITAL_COMMUNITY)
Admission: EM | Admit: 2015-03-22 | Discharge: 2015-03-23 | Disposition: A | Discharge status: Home / Self Care | Payer: Medicaid Other | Attending: Emergency Medicine | Admitting: Emergency Medicine

## 2015-03-22 DIAGNOSIS — Z87891 Personal history of nicotine dependence: Secondary | ICD-10-CM | POA: Diagnosis not present

## 2015-03-22 DIAGNOSIS — Z9889 Other specified postprocedural states: Secondary | ICD-10-CM | POA: Insufficient documentation

## 2015-03-22 DIAGNOSIS — Z590 Homelessness: Secondary | ICD-10-CM | POA: Insufficient documentation

## 2015-03-22 DIAGNOSIS — Z7902 Long term (current) use of antithrombotics/antiplatelets: Secondary | ICD-10-CM | POA: Insufficient documentation

## 2015-03-22 DIAGNOSIS — R2243 Localized swelling, mass and lump, lower limb, bilateral: Secondary | ICD-10-CM | POA: Diagnosis not present

## 2015-03-22 DIAGNOSIS — Z7952 Long term (current) use of systemic steroids: Secondary | ICD-10-CM | POA: Diagnosis not present

## 2015-03-22 DIAGNOSIS — M1990 Unspecified osteoarthritis, unspecified site: Secondary | ICD-10-CM | POA: Insufficient documentation

## 2015-03-22 DIAGNOSIS — Z8701 Personal history of pneumonia (recurrent): Secondary | ICD-10-CM | POA: Insufficient documentation

## 2015-03-22 DIAGNOSIS — E780 Pure hypercholesterolemia: Secondary | ICD-10-CM | POA: Insufficient documentation

## 2015-03-22 DIAGNOSIS — R079 Chest pain, unspecified: Secondary | ICD-10-CM | POA: Diagnosis present

## 2015-03-22 DIAGNOSIS — Z79899 Other long term (current) drug therapy: Secondary | ICD-10-CM | POA: Insufficient documentation

## 2015-03-22 DIAGNOSIS — R1013 Epigastric pain: Secondary | ICD-10-CM | POA: Insufficient documentation

## 2015-03-22 DIAGNOSIS — F329 Major depressive disorder, single episode, unspecified: Secondary | ICD-10-CM | POA: Diagnosis not present

## 2015-03-22 DIAGNOSIS — R0789 Other chest pain: Secondary | ICD-10-CM | POA: Diagnosis not present

## 2015-03-22 DIAGNOSIS — Z9119 Patient's noncompliance with other medical treatment and regimen: Secondary | ICD-10-CM | POA: Insufficient documentation

## 2015-03-22 DIAGNOSIS — F419 Anxiety disorder, unspecified: Secondary | ICD-10-CM | POA: Diagnosis not present

## 2015-03-22 DIAGNOSIS — N184 Chronic kidney disease, stage 4 (severe): Secondary | ICD-10-CM | POA: Insufficient documentation

## 2015-03-22 DIAGNOSIS — R0609 Other forms of dyspnea: Secondary | ICD-10-CM

## 2015-03-22 DIAGNOSIS — I129 Hypertensive chronic kidney disease with stage 1 through stage 4 chronic kidney disease, or unspecified chronic kidney disease: Secondary | ICD-10-CM | POA: Insufficient documentation

## 2015-03-22 DIAGNOSIS — I252 Old myocardial infarction: Secondary | ICD-10-CM | POA: Insufficient documentation

## 2015-03-22 DIAGNOSIS — Z7982 Long term (current) use of aspirin: Secondary | ICD-10-CM | POA: Insufficient documentation

## 2015-03-22 LAB — I-STAT TROPONIN, ED: TROPONIN I, POC: 0.05 ng/mL (ref 0.00–0.08)

## 2015-03-22 MED ORDER — GI COCKTAIL ~~LOC~~
30.0000 mL | Freq: Once | ORAL | Status: AC
  Administered 2015-03-23: 30 mL via ORAL
  Filled 2015-03-22: qty 30

## 2015-03-22 NOTE — ED Provider Notes (Signed)
CSN: [id number]   Arrival date & time 03/22/15 2306  History  This chart was scribed for Julianne Rice, MD by Altamease Oiler, ED Scribe. This patient was seen in room A12C/A12C and the patient's care was started at 11:20 PM.  Chief Complaint  Patient presents with  . Chest Pain  . Shortness of Breath  . Emesis    HPI The history is provided by the patient. No language interpreter was used.   Brought in by EMS, [name] is a 59 y.o. male with PMHx of NSTEMI, HTN, hypercholesteremia, CKD, and pneumonia who presents to the Emergency Department complaining of constant SOB with gradual onset around noon yesterday while walking around. The SOB is worse with exertion. Associated symptoms include intermittent right lower chest pain that he describes as tightness/pressure, cough, epigastric pain, nausea, and 2-3 episodes of emesis. His stools have been soft. 2 sublingual NTGs, 8 mg of Zofran, and 324 mg of aspirin given by EMS en route.   Past Medical History  Diagnosis Date  . Hypertension   . Homelessness   . Urinary hesitancy   . Hypercholesterolemia   . NSTEMI (non-ST elevated myocardial infarction) 01/07/2015  . Heart attack 07/2014  . Walking pneumonia 07/2014  . Headache     "probably weekly" (01/07/2015)  . Arthritis     "all over" (01/07/2015)  . Anxiety   . Depression   . Noncompliance   . Polysubstance abuse     etoh, cocaine  . Ischemic dilated cardiomyopathy   . CKD (chronic kidney disease), stage IV   . History of echocardiogram 12/2014    EF 20-25%    Past Surgical History  Procedure Laterality Date  . Left heart catheterization with coronary angiogram N/A 08/15/2014    Procedure: LEFT HEART CATHETERIZATION WITH CORONARY ANGIOGRAM;  Surgeon: Clent Demark, MD;  Location: [facility] CATH LAB;  Service: Cardiovascular;  Laterality: N/A;  . Cardiac catheterization      History reviewed. No pertinent family history.  Social History  Substance Use Topics  . Smoking status:  Former Smoker -- 1.50 packs/day for 30 years    Types: Cigarettes    Quit date: 02/19/2004  . Smokeless tobacco: Never Used  . Alcohol Use: No     Review of Systems  Constitutional: Negative for fever and chills.  Respiratory: Positive for chest tightness and shortness of breath. Negative for cough.   Cardiovascular: Positive for chest pain. Negative for palpitations and leg swelling.  Gastrointestinal: Positive for nausea, vomiting and abdominal pain. Negative for diarrhea, constipation and blood in stool.  Musculoskeletal: Negative for back pain, neck pain and neck stiffness.  Skin: Negative for rash and wound.  Neurological: Negative for dizziness, weakness, light-headedness, numbness and headaches.  All other systems reviewed and are negative.   Home Medications   Prior to Admission medications   Medication Sig Start Date End Date Taking? Authorizing Provider  aspirin EC 81 MG tablet Take 81 mg by mouth daily.    Historical Provider, MD  atorvastatin (LIPITOR) 20 MG tablet Take 1 tablet (20 mg total) by mouth daily. Patient not taking: Reported on 03/20/2015 09/07/14   Charolette Forward, MD  atorvastatin (LIPITOR) 80 MG tablet Take 80 mg by mouth daily.    Historical Provider, MD  carvedilol (COREG) 6.25 MG tablet Take 1 tablet (6.25 mg total) by mouth 2 (two) times daily with a meal. 09/07/14   Charolette Forward, MD  clopidogrel (PLAVIX) 75 MG tablet Take 1 tablet (75 mg total) by  mouth daily. 01/09/15   Charolette Forward, MD  digoxin (LANOXIN) 0.25 MG tablet Take 1 tablet (0.25 mg total) by mouth daily. 09/07/14   Charolette Forward, MD  furosemide (LASIX) 40 MG tablet Take 1 tablet (40 mg total) by mouth 2 (two) times daily. 09/07/14   Charolette Forward, MD  isosorbide-hydrALAZINE (BIDIL) 20-37.5 MG per tablet Take 1 tablet by mouth 2 (two) times daily. 03/10/15   Charolette Forward, MD  nitroGLYCERIN (NITROSTAT) 0.4 MG SL tablet Place 1 tablet (0.4 mg total) under the tongue every 5 (five) minutes x 3 doses as  needed for chest pain. 08/16/14   Dixie Dials, MD  predniSONE (DELTASONE) 20 MG tablet Take 2 tablets (40 mg total) by mouth daily. 03/15/15   Waynetta Pean, PA-C  ramipril (ALTACE) 1.25 MG capsule Take 1.25 mg by mouth daily.    Historical Provider, MD  traMADol (ULTRAM) 50 MG tablet Take 1 tablet (50 mg total) by mouth every 12 (twelve) hours as needed. 03/15/15   Waynetta Pean, PA-C    Allergies  Review of patient's allergies indicates no known allergies.  Triage Vitals: BP 147/98 mmHg  Pulse 111  Temp(Src) 97.9 F (36.6 C) (Oral)  Resp 18  Ht 5\' 8"  (1.727 m)  Wt 150 lb (68.04 kg)  BMI 22.81 kg/m2  SpO2 100%  Physical Exam  Constitutional: He is oriented to person, place, and time. He appears well-developed and well-nourished. No distress.  HENT:  Head: Normocephalic and atraumatic.  Mouth/Throat: Oropharynx is clear and moist.  Eyes: EOM are normal. Pupils are equal, round, and reactive to light.  Neck: Normal range of motion. Neck supple. JVD present.  Cardiovascular: Normal rate and regular rhythm.   Pulmonary/Chest: Effort normal and breath sounds normal. No respiratory distress. He has no wheezes. He has no rales.  Diminished bilateral bases  Abdominal: Soft. Bowel sounds are normal. There is tenderness (epigastric tenderness with palpation.).  Musculoskeletal: Normal range of motion. He exhibits edema. He exhibits no tenderness.  2+ bilateral pitting edema to lower extremities.  Neurological: He is alert and oriented to person, place, and time.  Skin: Skin is warm and dry. No rash noted. No erythema.  Psychiatric: He has a normal mood and affect. His behavior is normal.  Nursing note and vitals reviewed.   ED Course  Procedures   DIAGNOSTIC STUDIES: Oxygen Saturation is 100% on RA, normal by my interpretation.    COORDINATION OF CARE: 11:24 PM Discussed treatment plan which includes CXR, EKG, lab work, and a GI cocktail with pt at bedside and pt agreed to  plan.  Labs Reviewed  BASIC METABOLIC PANEL - Abnormal; Notable for the following:    Glucose, Bld 111 (*)    Creatinine, Ser 1.35 (*)    GFR calc non Af Amer 56 (*)    All other components within normal limits  CBC - Abnormal; Notable for the following:    RBC 3.89 (*)    MCV 104.6 (*)    All other components within normal limits  LIPASE, BLOOD - Abnormal; Notable for the following:    Lipase 19 (*)    All other components within normal limits  BRAIN NATRIURETIC PEPTIDE - Abnormal; Notable for the following:    B Natriuretic Peptide 3096.9 (*)    All other components within normal limits  HEPATIC FUNCTION PANEL  I-STAT TROPOININ, ED  Randolm Idol, ED    I, Julianne Rice, MD, personally reviewed and evaluated these images and lab results as part of  my medical decision-making.  Imaging Review Dg Chest 2 View  03/23/2015   CLINICAL DATA:  Chest pain and shortness of breath today. Previous heart attacks.  EXAM: CHEST  2 VIEW  COMPARISON:  03/07/2015  FINDINGS: Cardiac enlargement without pulmonary vascular congestion. No focal airspace disease or consolidation in the lungs. No blunting of costophrenic angles. No pneumothorax. Mediastinal contours appear intact. Tortuous aorta with mild calcification.  IMPRESSION: Cardiac enlargement.  No active cardiopulmonary disease.   Electronically Signed   By: Lucienne Capers M.D.   On: 03/23/2015 01:20    EKG Interpretation  Date/Time:  Sunday March 22 2015 23:20:23 EDT Ventricular Rate:  106 PR Interval:  174 QRS Duration: 146 QT Interval:  385 QTC Calculation: 511 R Axis:   -85 Text Interpretation:  Sinus tachycardia Nonspecific IVCD with LAD Repol abnrm, severe global ischemia (LM/MVD) Confirmed by Lita Mains  MD, Lorain Fettes ([id number]) on 03/23/2015 3:51:47 AM       MDM   Final diagnoses:  Dyspnea on exertion  Atypical chest pain     I personally performed the services described in this documentation, which was scribed in my  presence. The recorded information has been reviewed and is accurate.   Patient remains chest pain-free. He is able living in the emergency department maintaining high 90s saturations. Chest x-ray with without any active cardiopulmonary disease. Troponin 2 is normal. Patient continues to have a elevated BNP but this appears at his baseline. Patient is in agreement with plan to be discharged home to follow-up with his cardiologist as outpatient. He's been given return precautions and is voiced understanding.  Julianne Rice, MD 03/23/15 [phone]

## 2015-03-22 NOTE — ED Notes (Signed)
Pt presents with [facility] for CP with SOB and N/V since yesterday; pt reports pain as right sided lower chest pain and epigastric pain that radiates to the neck; pt was given 324 ASA, 2 SL nitro, 8mg  zofran enroute with some relief; pt reports the pain had a gradual onset; EMS reports RBBB on ECG strip; pt reports hx of MI in January 2016

## 2015-03-23 ENCOUNTER — Other Ambulatory Visit (HOSPITAL_COMMUNITY): Payer: Medicaid Other

## 2015-03-23 ENCOUNTER — Emergency Department (HOSPITAL_COMMUNITY): Payer: Medicaid Other

## 2015-03-23 LAB — CBC
HEMATOCRIT: 40.7 % (ref 39.0–52.0)
Hemoglobin: 13.2 g/dL (ref 13.0–17.0)
MCH: 33.9 pg (ref 26.0–34.0)
MCHC: 32.4 g/dL (ref 30.0–36.0)
MCV: 104.6 fL — AB (ref 78.0–100.0)
Platelets: 316 10*3/uL (ref 150–400)
RBC: 3.89 MIL/uL — ABNORMAL LOW (ref 4.22–5.81)
RDW: 13.4 % (ref 11.5–15.5)
WBC: 6.5 10*3/uL (ref 4.0–10.5)

## 2015-03-23 LAB — BASIC METABOLIC PANEL
Anion gap: 8 (ref 5–15)
BUN: 18 mg/dL (ref 6–20)
CHLORIDE: 106 mmol/L (ref 101–111)
CO2: 26 mmol/L (ref 22–32)
CREATININE: 1.35 mg/dL — AB (ref 0.61–1.24)
Calcium: 9.4 mg/dL (ref 8.9–10.3)
GFR calc Af Amer: >60 mL/min (ref >60)
GFR calc non Af Amer: 56 mL/min — ABNORMAL LOW (ref >60)
GLUCOSE: 111 mg/dL — AB (ref 65–99)
POTASSIUM: 4.1 mmol/L (ref 3.5–5.1)
Sodium: 140 mmol/L (ref 135–145)

## 2015-03-23 LAB — HEPATIC FUNCTION PANEL
ALK PHOS: 106 U/L (ref 38–126)
ALT: 32 U/L (ref 17–63)
AST: 29 U/L (ref 15–41)
Albumin: 3.6 g/dL (ref 3.5–5.0)
BILIRUBIN DIRECT: 0.3 mg/dL (ref 0.1–0.5)
BILIRUBIN INDIRECT: 0.3 mg/dL (ref 0.3–0.9)
BILIRUBIN TOTAL: 0.6 mg/dL (ref 0.3–1.2)
Total Protein: 6.7 g/dL (ref 6.5–8.1)

## 2015-03-23 LAB — I-STAT TROPONIN, ED: Troponin i, poc: 0.06 ng/mL (ref 0.00–0.08)

## 2015-03-23 LAB — LIPASE, BLOOD: Lipase: 19 U/L — ABNORMAL LOW (ref 22–51)

## 2015-03-23 LAB — BRAIN NATRIURETIC PEPTIDE: B NATRIURETIC PEPTIDE 5: 3096.9 pg/mL — AB (ref 0.0–100.0)

## 2015-03-23 NOTE — ED Notes (Signed)
Patient ambulated with stand by assistance with steady gait around nurses station; initial Sp02 was 100% and ending Sp02 was 99%; pt denied lightheadedness or dizziness; pt reported slight shortness of breath but improved since arrival

## 2015-03-23 NOTE — Discharge Instructions (Signed)
Call and make appointment to follow-up with your cardiologist. Return immediately for worsening pain, difficulty breathing or any concerns.  Chest Pain (Nonspecific) It is often hard to give a specific diagnosis for the cause of chest pain. There is always a chance that your pain could be related to something serious, such as a heart attack or a blood clot in the lungs. You need to follow up with your health care provider for further evaluation. CAUSES   Heartburn.  Pneumonia or bronchitis.  Anxiety or stress.  Inflammation around your heart (pericarditis) or lung (pleuritis or pleurisy).  A blood clot in the lung.  A collapsed lung (pneumothorax). It can develop suddenly on its own (spontaneous pneumothorax) or from trauma to the chest.  Shingles infection (herpes zoster virus). The chest wall is composed of bones, muscles, and cartilage. Any of these can be the source of the pain.  The bones can be bruised by injury.  The muscles or cartilage can be strained by coughing or overwork.  The cartilage can be affected by inflammation and become sore (costochondritis). DIAGNOSIS  Lab tests or other studies may be needed to find the cause of your pain. Your health care provider may have you take a test called an ambulatory electrocardiogram (ECG). An ECG records your heartbeat patterns over a 24-hour period. You may also have other tests, such as:  Transthoracic echocardiogram (TTE). During echocardiography, sound waves are used to evaluate how blood flows through your heart.  Transesophageal echocardiogram (TEE).  Cardiac monitoring. This allows your health care provider to monitor your heart rate and rhythm in real time.  Holter monitor. This is a portable device that records your heartbeat and can help diagnose heart arrhythmias. It allows your health care provider to track your heart activity for several days, if needed.  Stress tests by exercise or by giving medicine that makes  the heart beat faster. TREATMENT   Treatment depends on what may be causing your chest pain. Treatment may include:  Acid blockers for heartburn.  Anti-inflammatory medicine.  Pain medicine for inflammatory conditions.  Antibiotics if an infection is present.  You may be advised to change lifestyle habits. This includes stopping smoking and avoiding alcohol, caffeine, and chocolate.  You may be advised to keep your head raised (elevated) when sleeping. This reduces the chance of acid going backward from your stomach into your esophagus. Most of the time, nonspecific chest pain will improve within 2-3 days with rest and mild pain medicine.  HOME CARE INSTRUCTIONS   If antibiotics were prescribed, take them as directed. Finish them even if you start to feel better.  For the next few days, avoid physical activities that bring on chest pain. Continue physical activities as directed.  Do not use any tobacco products, including cigarettes, chewing tobacco, or electronic cigarettes.  Avoid drinking alcohol.  Only take medicine as directed by your health care provider.  Follow your health care provider's suggestions for further testing if your chest pain does not go away.  Keep any follow-up appointments you made. If you do not go to an appointment, you could develop lasting (chronic) problems with pain. If there is any problem keeping an appointment, call to reschedule. SEEK MEDICAL CARE IF:   Your chest pain does not go away, even after treatment.  You have a rash with blisters on your chest.  You have a fever. SEEK IMMEDIATE MEDICAL CARE IF:   You have increased chest pain or pain that spreads to your arm,  neck, jaw, back, or abdomen.  You have shortness of breath.  You have an increasing cough, or you cough up blood.  You have severe back or abdominal pain.  You feel nauseous or vomit.  You have severe weakness.  You faint.  You have chills. This is an emergency.  Do not wait to see if the pain will go away. Get medical help at once. Call your local emergency services (911 in [place] Do not drive yourself to the hospital. MAKE SURE YOU:   Understand these instructions.  Will watch your condition.  Will get help right away if you are not doing well or get worse. Document Released: 04/13/2005 Document Revised: 07/09/2013 Document Reviewed: 02/07/2008 [facility] Patient Information 2015 [place], [place]. This information is not intended to replace advice given to you by your health care provider. Make sure you discuss any questions you have with your health care provider.  Shortness of Breath Shortness of breath means you have trouble breathing. It could also mean that you have a medical problem. You should get immediate medical care for shortness of breath. CAUSES   Not enough oxygen in the air such as with high altitudes or a smoke-filled room.  Certain lung diseases, infections, or problems.  Heart disease or conditions, such as angina or heart failure.  Low red blood cells (anemia).  Poor physical fitness, which can cause shortness of breath when you exercise.  Chest or back injuries or stiffness.  Being overweight.  Smoking.  Anxiety, which can make you feel like you are not getting enough air. DIAGNOSIS  Serious medical problems can often be found during your physical exam. Tests may also be done to determine why you are having shortness of breath. Tests may include:  Chest X-rays.  Lung function tests.  Blood tests.  An electrocardiogram (ECG).  An ambulatory electrocardiogram. An ambulatory ECG records your heartbeat patterns over a 24-hour period.  Exercise testing.  A transthoracic echocardiogram (TTE). During echocardiography, sound waves are used to evaluate how blood flows through your heart.  A transesophageal echocardiogram (TEE).  Imaging scans. Your health care provider may not be able to find a cause for your  shortness of breath after your exam. In this case, it is important to have a follow-up exam with your health care provider as directed.  TREATMENT  Treatment for shortness of breath depends on the cause of your symptoms and can vary greatly. HOME CARE INSTRUCTIONS   Do not smoke. Smoking is a common cause of shortness of breath. If you smoke, ask for help to quit.  Avoid being around chemicals or things that may bother your breathing, such as paint fumes and dust.  Rest as needed. Slowly resume your usual activities.  If medicines were prescribed, take them as directed for the full length of time directed. This includes oxygen and any inhaled medicines.  Keep all follow-up appointments as directed by your health care provider. SEEK MEDICAL CARE IF:   Your condition does not improve in the time expected.  You have a hard time doing your normal activities even with rest.  You have any new symptoms. SEEK IMMEDIATE MEDICAL CARE IF:   Your shortness of breath gets worse.  You feel light-headed, faint, or develop a cough not controlled with medicines.  You start coughing up blood.  You have pain with breathing.  You have chest pain or pain in your arms, shoulders, or abdomen.  You have a fever.  You are unable to walk up  stairs or exercise the way you normally do. MAKE SURE YOU:  Understand these instructions.  Will watch your condition.  Will get help right away if you are not doing well or get worse. Document Released: 03/29/2001 Document Revised: 07/09/2013 Document Reviewed: 09/19/2011 [facility] Patient Information 2015 [place], [place]. This information is not intended to replace advice given to you by your health care provider. Make sure you discuss any questions you have with your health care provider.

## 2015-03-24 ENCOUNTER — Encounter: Payer: Self-pay | Admitting: Family Medicine

## 2015-03-24 ENCOUNTER — Encounter (HOSPITAL_BASED_OUTPATIENT_CLINIC_OR_DEPARTMENT_OTHER): Payer: Medicaid Other | Admitting: Clinical

## 2015-03-24 ENCOUNTER — Ambulatory Visit: Payer: Medicaid Other | Attending: Family Medicine | Admitting: Family Medicine

## 2015-03-24 VITALS — BP 135/87 | HR 105 | Temp 98.1°F | Ht 68.0 in | Wt 171.4 lb

## 2015-03-24 DIAGNOSIS — I10 Essential (primary) hypertension: Secondary | ICD-10-CM | POA: Insufficient documentation

## 2015-03-24 DIAGNOSIS — I5021 Acute systolic (congestive) heart failure: Secondary | ICD-10-CM | POA: Insufficient documentation

## 2015-03-24 DIAGNOSIS — Z5900 Homelessness unspecified: Secondary | ICD-10-CM | POA: Insufficient documentation

## 2015-03-24 DIAGNOSIS — M10072 Idiopathic gout, left ankle and foot: Secondary | ICD-10-CM | POA: Diagnosis not present

## 2015-03-24 DIAGNOSIS — Z87891 Personal history of nicotine dependence: Secondary | ICD-10-CM | POA: Diagnosis not present

## 2015-03-24 DIAGNOSIS — G44219 Episodic tension-type headache, not intractable: Secondary | ICD-10-CM

## 2015-03-24 DIAGNOSIS — M109 Gout, unspecified: Secondary | ICD-10-CM | POA: Insufficient documentation

## 2015-03-24 DIAGNOSIS — F411 Generalized anxiety disorder: Secondary | ICD-10-CM

## 2015-03-24 MED ORDER — COLCHICINE 0.6 MG PO TABS: ORAL_TABLET | ORAL | Status: DC

## 2015-03-24 MED ORDER — FUROSEMIDE 10 MG/ML IJ SOLN
40.0000 mg | Freq: Once | INTRAMUSCULAR | Status: AC
  Administered 2015-03-24: 40 mg via INTRAMUSCULAR

## 2015-03-24 MED ORDER — FUROSEMIDE 40 MG PO TABS: 40.0000 mg | ORAL_TABLET | Freq: Once | ORAL | Status: DC

## 2015-03-24 MED ORDER — ALLOPURINOL 300 MG PO TABS: 300.0000 mg | ORAL_TABLET | Freq: Every day | ORAL | Status: DC

## 2015-03-24 MED ORDER — CARVEDILOL 3.125 MG PO TABS: 3.1250 mg | ORAL_TABLET | Freq: Two times a day (BID) | ORAL | Status: DC

## 2015-03-24 NOTE — Progress Notes (Signed)
Subjective:  Patient ID: [name], male    DOB: 12/10/1955  Age: 59 y.o. MRN: [id number]  CC: Hospitalization Follow-up   HPI [name] presents for follow-up of gout flare. Medical history is notable for systolic CHF (EF 0000000), hypertension, gout. He has had multiple ED visits in the last 1 month, the last of which was 2 days ago when he had presented to the ED at [facility] with worsening shortness of breath and was brought in via EMS. He was saturating at 100% on presentation, troponins were 0.04, 0.04, 0.03; he had a BNP of 3096.9, EKG revealed non specific IVCD with LAD, repolarization abnormality, severe global ischemia. CXR was negative for active cardiopulmonary disease; he received GI cocktail and was subsequently discharged to follow-up with his cardiologist. 2 days Prior to that presentation he was treated in the ED for a gout flare and placed on tramadol and prednisone.  Today he reports being short of breath with mild exertion and has been compliant with his medications except for today because he was in a hurry. He continues to have left ankle pain and swelling from his gout flare. He has been compliant with his prednisone. He does not have a primary care physician but follows up with his cardiologist whom he saw a month ago- Dr Terrence Dupont.  Outpatient Prescriptions Prior to Visit  Medication Sig Dispense Refill  . aspirin EC 81 MG tablet Take 81 mg by mouth daily.    Marland Kitchen atorvastatin (LIPITOR) 20 MG tablet Take 1 tablet (20 mg total) by mouth daily. 30 tablet 3  . atorvastatin (LIPITOR) 80 MG tablet Take 80 mg by mouth daily.    . clopidogrel (PLAVIX) 75 MG tablet Take 1 tablet (75 mg total) by mouth daily. 30 tablet 3  . digoxin (LANOXIN) 0.25 MG tablet Take 1 tablet (0.25 mg total) by mouth daily. 30 tablet 3  . furosemide (LASIX) 40 MG tablet Take 1 tablet (40 mg total) by mouth 2 (two) times daily. 60 tablet 3  . isosorbide-hydrALAZINE (BIDIL) 20-37.5 MG per tablet  Take 1 tablet by mouth 2 (two) times daily. 60 tablet 3  . nitroGLYCERIN (NITROSTAT) 0.4 MG SL tablet Place 1 tablet (0.4 mg total) under the tongue every 5 (five) minutes x 3 doses as needed for chest pain. 25 tablet 1  . predniSONE (DELTASONE) 20 MG tablet Take 2 tablets (40 mg total) by mouth daily. 10 tablet 0  . ramipril (ALTACE) 1.25 MG capsule Take 1.25 mg by mouth daily.    . traMADol (ULTRAM) 50 MG tablet Take 1 tablet (50 mg total) by mouth every 12 (twelve) hours as needed. 12 tablet 0  . carvedilol (COREG) 6.25 MG tablet Take 1 tablet (6.25 mg total) by mouth 2 (two) times daily with a meal. 60 tablet 3   No facility-administered medications prior to visit.    ROS Review of Systems  Constitutional: Negative for activity change and appetite change.  HENT: Negative for sinus pressure and sore throat.   Eyes: Negative for visual disturbance.  Respiratory: Positive for shortness of breath. Negative for cough and chest tightness.   Cardiovascular: Positive for leg swelling. Negative for chest pain.  Gastrointestinal: Negative for abdominal pain, diarrhea, constipation and abdominal distention.  Endocrine: Negative.   Genitourinary: Negative for dysuria.  Musculoskeletal: Positive for joint swelling (Left ankle). Negative for myalgias.  Skin: Negative for rash.  Allergic/Immunologic: Negative.   Neurological: Positive for headaches. Negative for weakness, light-headedness and numbness.  Psychiatric/Behavioral: Negative  for suicidal ideas and dysphoric mood.     Objective:  BP 124/95 mmHg  Pulse 105  Temp(Src) 98.1 F (36.7 C)  Ht 5\' 8"  (1.727 m)  Wt 171 lb 6.4 oz (77.747 kg)  BMI 26.07 kg/m2  SpO2 99%  BP/Weight 03/24/2015 [id number]  Systolic BP [id number] -  Diastolic BP 95 94 -  Wt. (Lbs) 171.4 - 150  BMI 26.07 - 22.81   Wt Readings from Last 3 Encounters:  03/24/15 171 lb 6.4 oz (77.747 kg)  03/22/15 150 lb (68.04 kg)  03/10/15 148 lb 12.8 oz (67.495 kg)      Lab Results  Component Value Date   WBC 6.5 03/22/2015   HGB 13.2 03/22/2015   HCT 40.7 03/22/2015   PLT 316 03/22/2015   GLUCOSE 111* 03/22/2015   CHOL 95 09/04/2014   TRIG 76 09/04/2014   HDL 24* 09/04/2014   LDLCALC 56 09/04/2014   ALT 32 03/22/2015   AST 29 03/22/2015   NA 140 03/22/2015   K 4.1 03/22/2015   CL 106 03/22/2015   CREATININE 1.35* 03/22/2015   BUN 18 03/22/2015   CO2 26 03/22/2015   TSH 2.864 03/07/2015   INR 1.07 03/15/2015   HGBA1C 5.4 08/14/2014   BNP (last 3 results)  Recent Labs  03/07/15 2032 03/09/15 0407 03/22/15 2331  BNP >4500.0* 967.5* 3096.9*      Physical Exam  Constitutional: He is oriented to person, place, and time. He appears well-developed and well-nourished.  Dyspneic  HENT:  Head: Normocephalic and atraumatic.  Right Ear: External ear normal.  Left Ear: External ear normal.  Eyes: Conjunctivae and EOM are normal. Pupils are equal, round, and reactive to light.  Periorbital edema.  Neck: Normal range of motion. Neck supple. JVD present. No tracheal deviation present.  Cardiovascular: Regular rhythm and normal heart sounds.  Tachycardia present.   No murmur heard. Pulmonary/Chest: Effort normal and breath sounds normal. No respiratory distress. He has no wheezes. He exhibits no tenderness.  Abdominal: Soft. Bowel sounds are normal. He exhibits no mass. There is no tenderness.  Musculoskeletal: He exhibits edema (2+ left ankle edema which is nonpitting; no edema of right ankle.) and tenderness (left ankle tenderness; right ankle is normal.).  Neurological: He is alert and oriented to person, place, and time.  Skin: Skin is warm and dry.  Psychiatric: He has a normal mood and affect.     Assessment & Plan:  59 year old male with a history of acute systolic heart failure, hypertension, gout who presents today with a gout flare and worsening dyspnea; review of chart indicates recent treatment for gout flare with  prednisone.  1. Acute systolic heart failure Has gained 23 pounds in the last 3 weeks and BNP has trended up from 967.5 to 3096 in the last 2 weeks. Evidence of acute fluid overload. 40 mg IV Lasix administered in the clinic and patient observed prior to discharge. And vitals repeated Recent use of prednisone for gout flare likely contributory. We'll reassess at next office visit and may need to taper off dose of Lasix if blood pressure tolerates holds up   2. Essential hypertension Blood pressure is on the low side and so I have decreased his Coreg from 6.25 to 3.125 mg twice daily  3. Acute idiopathic gout of left ankle Patient advised to take last dose of prednisone and discontinue after that due to worsening of heart failure. Commenced on colchicine for acute attacks and allopurinol for chronic  4.  Homelessness LCSW called in to see patient to discuss community resources.  5. Episodic tension-type headache, not intractable Likely tension headache due to current stressor. Tylenol given in the clinic.   Meds ordered this encounter  Medications  . carvedilol (COREG) 3.125 MG tablet    Sig: Take 1 tablet (3.125 mg total) by mouth 2 (two) times daily with a meal. Discontinue 6.25mg     Dispense:  60 tablet    Refill:  1  . allopurinol (ZYLOPRIM) 300 MG tablet    Sig: Take 1 tablet (300 mg total) by mouth daily.    Dispense:  30 tablet    Refill:  6  . colchicine 0.6 MG tablet    Sig: Take 2 tabs (1.2mg ) PO at the onset of a gout attack, may repeat 1 tab(0.6mg ) an hour later, max 1.8mg     Dispense:  30 tablet    Refill:  1    Follow-up:  1 week with Dr Jarold Song for follow up of CHF   [name] MD   Patient ID: [name], male   DOB: 15-Oct-1955, 59 y.o.   MRN: [id number]

## 2015-03-24 NOTE — Patient Instructions (Signed)

## 2015-03-24 NOTE — Progress Notes (Signed)
ASSESSMENT: Pt currently experiencing symptoms of anxiety, as well as some symptoms of depression. Pt needs to f/u with PCP, and would benefit from psychoeducation and supportive counseling regarding coping with symptoms of anxiety and depression.  Stage of Change: contemplative  PLAN: 1. F/U with behavioral health consultant in as needed 2. Psychiatric Medications: none. 3. Behavioral recommendation(s):   -Continue reading books as long as they help to cope w anxiety -Consider reading educational materials regarding coping with symptoms of anxiety and depression -Consider daily relaxation breathing exercises, as practiced in office visit SUBJECTIVE: Pt. referred by Dr Jarold Song for symptoms of anxiety: Pt. reports the following symptoms/concerns: Pt states that he sometimes feels depressed about life, but mostly feels that anxiety is the bigger problem. He now has more stable housing, but if his roommate is too loud, he has trouble sleeping, and often has to sit up to sleep; he is afraid he may stop breathing and tends to overbreathe when he feels stressed.  Duration of problem: undetermined number of years Severity: moderate  OBJECTIVE: Orientation & Cognition: Oriented x3. Thought processes normal and appropriate to situation. Mood: appropriate. Affect: appropriate Appearance: appropriate Risk of harm to self or others: no risk of harm to self or others Substance use: tobacco, alcohol Assessments administered: n/a  Diagnosis: Anxiety state CPT Code: F41.1 -------------------------------------------- Other(s) present in the room: none  Time spent with patient in exam room: 16 minutes

## 2015-03-24 NOTE — Progress Notes (Signed)
Patient here follow up on his gout He states Lrft foot pain 5/10 aching pain He has gained 20 lbs since last weight taken and describes trouble with his breathing He has been taking all medications but did not take them yet today

## 2015-03-31 ENCOUNTER — Ambulatory Visit: Payer: Medicaid Other | Attending: Family Medicine | Admitting: Family Medicine

## 2015-03-31 ENCOUNTER — Encounter: Payer: Self-pay | Admitting: Family Medicine

## 2015-03-31 VITALS — BP 139/80 | HR 97 | Temp 98.2°F | Ht 68.0 in | Wt 155.4 lb

## 2015-03-31 DIAGNOSIS — R51 Headache: Secondary | ICD-10-CM | POA: Diagnosis not present

## 2015-03-31 DIAGNOSIS — M10072 Idiopathic gout, left ankle and foot: Secondary | ICD-10-CM

## 2015-03-31 DIAGNOSIS — I10 Essential (primary) hypertension: Secondary | ICD-10-CM | POA: Diagnosis not present

## 2015-03-31 DIAGNOSIS — I5021 Acute systolic (congestive) heart failure: Secondary | ICD-10-CM | POA: Insufficient documentation

## 2015-03-31 MED ORDER — FUROSEMIDE 40 MG PO TABS: 60.0000 mg | ORAL_TABLET | Freq: Two times a day (BID) | ORAL | Status: DC

## 2015-03-31 MED ORDER — TRAMADOL HCL 50 MG PO TABS: 50.0000 mg | ORAL_TABLET | Freq: Two times a day (BID) | ORAL | Status: DC | PRN

## 2015-03-31 NOTE — Patient Instructions (Signed)

## 2015-03-31 NOTE — Progress Notes (Signed)
Follow  Up on left foot gout Pain is 2/3 in his ankle/foot He is almost out of Tramadol He reports taking all medications He is no longer homeless and is living in a motel He reports no headaches and is seeing a neurologist on the 27th of September

## 2015-03-31 NOTE — Progress Notes (Signed)
Subjective:    Patient ID: [name], male    DOB: 02/10/1956, 59 y.o.   MRN: [id number]  HPI  59 year old male with a history of acute systolic CHF (EF 0000000) hypertension, gout here for a follow up visit. At his last office visit he had been on prednisone for a gout flare and had been dyspneic for which we had given him 40 mg IV Lasix in the clinic;His BNP at the time was 3096.  His carvedilol was also decreased from 6.25 to 3.125 mg due to his blood pressure being in the hyportensive range with resulting improvement in his blood pressure today. He was commenced on colchicine and tramadol for acute gout flare and reports significant improvement in symptoms with this pain in the left ankle at 3/10 at this time and reduction in edema of his left ankle.  He does complain of orthopnea and paroxysmal nocturnal dyspnea but denies any dyspnea on mild exertion. He does have a cardiologist whom he saw her recently and has a follow-up appointment in 3 months.   Past Medical History  Diagnosis Date  . Hypertension   . Homelessness   . Urinary hesitancy   . Hypercholesterolemia   . NSTEMI (non-ST elevated myocardial infarction) 01/07/2015  . Heart attack 07/2014  . Walking pneumonia 07/2014  . Headache     "probably weekly" (01/07/2015)  . Arthritis     "all over" (01/07/2015)  . Anxiety   . Depression   . Noncompliance   . Polysubstance abuse     etoh, cocaine  . Ischemic dilated cardiomyopathy   . CKD (chronic kidney disease), stage IV   . History of echocardiogram 12/2014    EF 20-25%    Past Surgical History  Procedure Laterality Date  . Left heart catheterization with coronary angiogram N/A 08/15/2014    Procedure: LEFT HEART CATHETERIZATION WITH CORONARY ANGIOGRAM;  Surgeon: Clent Demark, MD;  Location: [facility] CATH LAB;  Service: Cardiovascular;  Laterality: N/A;  . Cardiac catheterization      Social History   Social History  . Marital Status: Single    Spouse Name: N/A    . Number of Children: N/A  . Years of Education: N/A   Occupational History  . Not on file.   Social History Main Topics  . Smoking status: Former Smoker -- 1.50 packs/day for 30 years    Types: Cigarettes    Quit date: 02/19/2004  . Smokeless tobacco: Never Used  . Alcohol Use: No  . Drug Use: Yes    Special: Cocaine     Comment: former  . Sexual Activity: No     Comment: crack   Other Topics Concern  . Not on file   Social History Narrative    No Known Allergies  Current Outpatient Prescriptions on File Prior to Visit  Medication Sig Dispense Refill  . allopurinol (ZYLOPRIM) 300 MG tablet Take 1 tablet (300 mg total) by mouth daily. 30 tablet 6  . aspirin EC 81 MG tablet Take 81 mg by mouth daily.    Marland Kitchen atorvastatin (LIPITOR) 20 MG tablet Take 1 tablet (20 mg total) by mouth daily. 30 tablet 3  . atorvastatin (LIPITOR) 80 MG tablet Take 80 mg by mouth daily.    . carvedilol (COREG) 3.125 MG tablet Take 1 tablet (3.125 mg total) by mouth 2 (two) times daily with a meal. Discontinue 6.25mg  60 tablet 1  . clopidogrel (PLAVIX) 75 MG tablet Take 1 tablet (75 mg total) by mouth  daily. 30 tablet 3  . colchicine 0.6 MG tablet Take 2 tabs (1.2mg ) PO at the onset of a gout attack, may repeat 1 tab(0.6mg ) an hour later, max 1.8mg  30 tablet 1  . digoxin (LANOXIN) 0.25 MG tablet Take 1 tablet (0.25 mg total) by mouth daily. 30 tablet 3  . isosorbide-hydrALAZINE (BIDIL) 20-37.5 MG per tablet Take 1 tablet by mouth 2 (two) times daily. 60 tablet 3  . nitroGLYCERIN (NITROSTAT) 0.4 MG SL tablet Place 1 tablet (0.4 mg total) under the tongue every 5 (five) minutes x 3 doses as needed for chest pain. 25 tablet 1  . predniSONE (DELTASONE) 20 MG tablet Take 2 tablets (40 mg total) by mouth daily. 10 tablet 0  . ramipril (ALTACE) 1.25 MG capsule Take 1.25 mg by mouth daily.     Current Facility-Administered Medications on File Prior to Visit  Medication Dose Route Frequency Provider Last Rate  Last Dose  . furosemide (LASIX) tablet 40 mg  40 mg Oral Once [name], MD   40 mg at 03/24/15 1615      Review of Systems  Constitutional: Negative for activity change and appetite change.  HENT: Negative for sinus pressure and sore throat.   Eyes: Negative for visual disturbance.  Respiratory: Negative for cough and chest tightness.        Orthopnea, paroxysmal nocturnal dyspnea.  Cardiovascular: Positive for leg swelling. Negative for chest pain.  Gastrointestinal: Negative for abdominal pain, diarrhea, constipation and abdominal distention.  Endocrine: Negative.   Genitourinary: Negative for dysuria.  Musculoskeletal: Positive for joint swelling (Left ankle). Negative for myalgias.  Skin: Negative for rash.  Allergic/Immunologic: Negative.   Neurological: Negative for weakness, light-headedness, numbness and headaches.  Psychiatric/Behavioral: Negative for suicidal ideas and dysphoric mood.       Objective: Filed Vitals:   03/31/15 1142  BP: 139/80  Pulse: 97  Temp: 98.2 F (36.8 C)  Height: 5\' 8"  (1.727 m)  Weight: 155 lb 6.4 oz (70.489 kg)  SpO2: 97%       Physical Exam  Constitutional: He is oriented to person, place, and time. He appears well-developed and well-nourished.  HENT:  Head: Normocephalic and atraumatic.  Right Ear: External ear normal.  Left Ear: External ear normal.  Eyes: Conjunctivae and EOM are normal. Pupils are equal, round, and reactive to light.  Neck: Normal range of motion. Neck supple. JVD present. No tracheal deviation present.  Cardiovascular: Regular rhythm and normal heart sounds.  Tachycardia present.   No murmur heard. Pulmonary/Chest: Effort normal and breath sounds normal. No respiratory distress. He has no wheezes. He exhibits no tenderness.  Abdominal: Soft. Bowel sounds are normal. He exhibits no mass. There is no tenderness.  Musculoskeletal: He exhibits edema (1+ left ankle nonpitting edema.). He exhibits no tenderness.    Neurological: He is alert and oriented to person, place, and time.  Skin: Skin is warm and dry.  Psychiatric: He has a normal mood and affect.     CMP Latest Ref Rng 03/22/2015 03/20/2015 03/15/2015  Glucose 65 - 99 mg/dL 111(H) 101(H) 82  BUN 6 - 20 mg/dL 18 28(H) 22(H)  Creatinine 0.61 - 1.24 mg/dL 1.35(H) 1.37(H) 1.81(H)  Sodium 135 - 145 mmol/L 140 141 139  Potassium 3.5 - 5.1 mmol/L 4.1 4.3 4.2  Chloride 101 - 111 mmol/L 106 106 101  CO2 22 - 32 mmol/L 26 27 31   Calcium 8.9 - 10.3 mg/dL 9.4 9.7 9.6  Total Protein 6.5 - 8.1 g/dL 6.7 - -  Total Bilirubin 0.3 - 1.2 mg/dL 0.6 - -  Alkaline Phos 38 - 126 U/L 106 - -  AST 15 - 41 U/L 29 - -  ALT 17 - 63 U/L 32 - -     BNP (last 3 results)  Recent Labs  03/07/15 2032 03/09/15 0407 03/22/15 2331  BNP >4500.0* 967.5* 3096.9*    ProBNP (last 3 results) No results for input(s): PROBNP in the last 8760 hours.        Assessment & Plan:  59 year old male with a history of acute systolic heart failure  (EF 20-25%) hypertension, gout  Here for follow-up of gout flare.  1. Acute systolic heart failure  received IV Lasix in the clinic last week. No evidence of acute fluid overload. His weight reveals improvement and he is down from 171 to 155 lbs Increased Lasix from 40 to 60mg  bid BNP and Digoxin at next visit Scheduled to see cardiology in 05/2015.   2. Essential hypertension Blood pressure has improved since Coreg dose was decreased. Low sodium, DASH diet.  3. Acute idiopathic gout of left ankle Gout flare has resolved Advised to commence Allopurinol and hold off on Colchicine. Refilled Tramadol   4. Episodic tension-type headache, not intractable Asymptomatic at this time.

## 2015-04-08 ENCOUNTER — Telehealth: Payer: Self-pay

## 2015-04-08 NOTE — Telephone Encounter (Signed)
Call received from the patient stating that his brother received a call from the [facility] confirming an appointment for him ( patient) for next week. Confirmed with the patient that his appointment is for 04/13/15 @ 0930 with Dr Jarold Song.  He also said that he was at the [facility] pharmacy today and he is trying to transfer his prescriptions from [facility] to [facility].  This CM checked with Kerby Moors, Pharmacist and they are still waiting for the transfer but noted that the prescriptions should be ready tomorrow. This CM informed the patient to call the pharmacy tomorrow to confirm thet the prescpriptions are ready before coming to pick them up and he verbalized understanding.

## 2015-04-09 ENCOUNTER — Telehealth: Payer: Self-pay | Admitting: Cardiology

## 2015-04-09 NOTE — Telephone Encounter (Signed)
Patient presents inquiring about a prescription for a cane. He no longer has the one the hospital gave you. Please f/u with patient to let him know about his options.

## 2015-04-13 ENCOUNTER — Encounter: Payer: Self-pay | Admitting: Family Medicine

## 2015-04-13 ENCOUNTER — Ambulatory Visit: Payer: Medicaid Other | Attending: Family Medicine | Admitting: Family Medicine

## 2015-04-13 VITALS — BP 119/84 | HR 99 | Temp 98.1°F | Ht 68.0 in | Wt 152.0 lb

## 2015-04-13 DIAGNOSIS — I129 Hypertensive chronic kidney disease with stage 1 through stage 4 chronic kidney disease, or unspecified chronic kidney disease: Secondary | ICD-10-CM | POA: Insufficient documentation

## 2015-04-13 DIAGNOSIS — M10072 Idiopathic gout, left ankle and foot: Secondary | ICD-10-CM | POA: Diagnosis not present

## 2015-04-13 DIAGNOSIS — Z87891 Personal history of nicotine dependence: Secondary | ICD-10-CM | POA: Insufficient documentation

## 2015-04-13 DIAGNOSIS — E780 Pure hypercholesterolemia: Secondary | ICD-10-CM | POA: Diagnosis not present

## 2015-04-13 DIAGNOSIS — I10 Essential (primary) hypertension: Secondary | ICD-10-CM

## 2015-04-13 DIAGNOSIS — I5021 Acute systolic (congestive) heart failure: Secondary | ICD-10-CM | POA: Insufficient documentation

## 2015-04-13 DIAGNOSIS — N184 Chronic kidney disease, stage 4 (severe): Secondary | ICD-10-CM | POA: Insufficient documentation

## 2015-04-13 LAB — BASIC METABOLIC PANEL
BUN: 30 mg/dL — AB (ref 7–25)
CO2: 25 mmol/L (ref 20–31)
Calcium: 9.9 mg/dL (ref 8.6–10.3)
Chloride: 101 mmol/L (ref 98–110)
Creat: 1.75 mg/dL — ABNORMAL HIGH (ref 0.70–1.25)
GLUCOSE: 90 mg/dL (ref 65–99)
POTASSIUM: 4 mmol/L (ref 3.5–5.3)
Sodium: 136 mmol/L (ref 135–146)

## 2015-04-13 NOTE — Progress Notes (Signed)
Subjective:    Patient ID: [name], male    DOB: [birth year], 59 y.o.   MRN: [id number]  HPI  [name] is a 59 year old male with a history of systolic CHF (EF 0000000), hypertension, gout here for follow-up of CHF. He had complained of persisting orthopnea and dyspnea and his JVP was elevated and so Lasix was increased from 40 mg twice daily to 60 mg twice daily with resulting improvement in his symptoms. He complains of dyspnea on mild to moderate exertion which is his baseline and is requesting a cane to assist him especially when he goes uphill.  He is compliant with his antihypertensive and his blood pressure is normal today; he is also adhering to a low sodium diet. There are no complaints of gout flare as he has been taking his allopurinol as prescribed.  Past Medical History  Diagnosis Date  . Hypertension   . Homelessness   . Urinary hesitancy   . Hypercholesterolemia   . NSTEMI (non-ST elevated myocardial infarction) 01/07/2015  . Heart attack 07/2014  . Walking pneumonia 07/2014  . Headache     "probably weekly" (01/07/2015)  . Arthritis     "all over" (01/07/2015)  . Anxiety   . Depression   . Noncompliance   . Polysubstance abuse     etoh, cocaine  . Ischemic dilated cardiomyopathy   . CKD (chronic kidney disease), stage IV   . History of echocardiogram 12/2014    EF 20-25%    Past Surgical History  Procedure Laterality Date  . Left heart catheterization with coronary angiogram N/A 08/15/2014    Procedure: LEFT HEART CATHETERIZATION WITH CORONARY ANGIOGRAM;  Surgeon: Clent Demark, MD;  Location: [facility] CATH LAB;  Service: Cardiovascular;  Laterality: N/A;  . Cardiac catheterization      Social History   Social History  . Marital Status: Single    Spouse Name: N/A  . Number of Children: N/A  . Years of Education: N/A   Occupational History  . Not on file.   Social History Main Topics  . Smoking status: Former Smoker -- 1.50 packs/day for 30  years    Types: Cigarettes    Quit date: 02/19/2004  . Smokeless tobacco: Never Used  . Alcohol Use: Yes     Comment: social  . Drug Use: No     Comment: former  . Sexual Activity: No     Comment: crack   Other Topics Concern  . Not on file   Social History Narrative    Scheduled Meds: . furosemide  40 mg Oral Once   Continuous Infusions:  PRN Meds:.   Review of Systems  Constitutional: Negative for activity change and appetite change.  HENT: Negative for sinus pressure and sore throat.   Eyes: Negative for visual disturbance.  Respiratory: Negative for cough, chest tightness and shortness of breath.   Cardiovascular: Negative for chest pain and leg swelling.  Gastrointestinal: Negative for abdominal pain, diarrhea, constipation and abdominal distention.  Endocrine: Negative.   Genitourinary: Negative for dysuria.  Musculoskeletal: Negative for myalgias and joint swelling.  Skin: Negative for rash.  Allergic/Immunologic: Negative.   Neurological: Negative for weakness, light-headedness and numbness.  Psychiatric/Behavioral: Negative for suicidal ideas and dysphoric mood.       Objective: Filed Vitals:   04/13/15 0953  BP: 119/84  Pulse: 99  Temp: 98.1 F (36.7 C)  Height: 5\' 8"  (1.727 m)  Weight: 152 lb (68.947 kg)  SpO2: 100%    Wt  Readings from Last 3 Encounters:  04/13/15 152 lb (68.947 kg)  03/31/15 155 lb 6.4 oz (70.489 kg)  03/24/15 171 lb 6.4 oz (77.747 kg)      Physical Exam  Constitutional: He is oriented to person, place, and time. He appears well-developed and well-nourished.  Neck: Normal range of motion. No JVD present.  Cardiovascular: Normal rate, normal heart sounds and intact distal pulses.   No murmur heard. Pulmonary/Chest: Effort normal and breath sounds normal. He has no wheezes. He has no rales. He exhibits no tenderness.  Abdominal: Soft. Bowel sounds are normal. He exhibits no distension and no mass. There is no tenderness.    Musculoskeletal: Normal range of motion. He exhibits no edema or tenderness.  Neurological: He is alert and oriented to person, place, and time.  Psychiatric: He has a normal mood and affect.          Assessment & Plan:  59 year old male with a history of acute systolic heart failure  (EF 20-25%) hypertension, gout  Here for follow-up of CHF exacerbation  1. Acute systolic heart failure, NYHA III  No evidence of fluid overload His weight reveals improvement and he is down by 3 pounds in the last 2 weeks Continue Lasix 60mg  twice daily BNP, digoxin level and basic metabolic panel checked today and if creatinine is trending up I will reduce his dose of Lasix. Scheduled to see cardiology in 05/2015.   2. Essential hypertension Blood pressure has improved since Coreg dose was decreased. Low sodium, DASH diet.  3. Acute idiopathic gout of left ankle Gout flare has resolved Advised to commence Allopurinol and hold off on Colchicine.

## 2015-04-13 NOTE — Progress Notes (Signed)
Patient states his gout has improved He needs to pick up his medication refills today in pharmacy He took all meds this am He sees neurology tomorrow

## 2015-04-13 NOTE — Patient Instructions (Signed)

## 2015-04-14 LAB — PRO B NATRIURETIC PEPTIDE: Pro B Natriuretic peptide (BNP): 3040 pg/mL — ABNORMAL HIGH (ref <126)

## 2015-04-14 LAB — DIGOXIN LEVEL: Digoxin Level: 0.8 ug/L (ref 0.8–2.0)

## 2015-04-17 ENCOUNTER — Telehealth: Payer: Self-pay | Admitting: *Deleted

## 2015-04-17 NOTE — Telephone Encounter (Signed)
Patient is homeless and phone listed is for his brother.  Spoke to brother who stated patient was not around at the moment.  He said he saw my number on his cell phone and would have his brother call.

## 2015-04-17 NOTE — Telephone Encounter (Signed)
-----   Message from [name], MD sent at 04/14/2015  8:44 AM EDT ----- Please advise him to remain on Lasix 60mg  bid as his labs reveal evidence of acute CHF

## 2015-04-29 ENCOUNTER — Other Ambulatory Visit (HOSPITAL_COMMUNITY): Payer: Self-pay | Admitting: Urology

## 2015-04-29 DIAGNOSIS — D49519 Neoplasm of unspecified behavior of unspecified kidney: Secondary | ICD-10-CM

## 2015-04-30 ENCOUNTER — Emergency Department (HOSPITAL_COMMUNITY): Payer: Medicaid Other

## 2015-04-30 ENCOUNTER — Emergency Department (HOSPITAL_COMMUNITY)
Admission: EM | Admit: 2015-04-30 | Discharge: 2015-04-30 | Disposition: A | Discharge status: Home / Self Care | Payer: Medicaid Other | Attending: Emergency Medicine | Admitting: Emergency Medicine

## 2015-04-30 ENCOUNTER — Encounter (HOSPITAL_COMMUNITY): Payer: Self-pay | Admitting: Emergency Medicine

## 2015-04-30 DIAGNOSIS — R7989 Other specified abnormal findings of blood chemistry: Secondary | ICD-10-CM | POA: Insufficient documentation

## 2015-04-30 DIAGNOSIS — R1013 Epigastric pain: Secondary | ICD-10-CM | POA: Insufficient documentation

## 2015-04-30 DIAGNOSIS — I129 Hypertensive chronic kidney disease with stage 1 through stage 4 chronic kidney disease, or unspecified chronic kidney disease: Secondary | ICD-10-CM | POA: Insufficient documentation

## 2015-04-30 DIAGNOSIS — L500 Allergic urticaria: Secondary | ICD-10-CM | POA: Insufficient documentation

## 2015-04-30 DIAGNOSIS — M1990 Unspecified osteoarthritis, unspecified site: Secondary | ICD-10-CM | POA: Diagnosis not present

## 2015-04-30 DIAGNOSIS — F329 Major depressive disorder, single episode, unspecified: Secondary | ICD-10-CM | POA: Diagnosis not present

## 2015-04-30 DIAGNOSIS — L299 Pruritus, unspecified: Secondary | ICD-10-CM | POA: Insufficient documentation

## 2015-04-30 DIAGNOSIS — Z8674 Personal history of sudden cardiac arrest: Secondary | ICD-10-CM | POA: Diagnosis not present

## 2015-04-30 DIAGNOSIS — Z590 Homelessness: Secondary | ICD-10-CM | POA: Insufficient documentation

## 2015-04-30 DIAGNOSIS — I252 Old myocardial infarction: Secondary | ICD-10-CM | POA: Diagnosis not present

## 2015-04-30 DIAGNOSIS — F419 Anxiety disorder, unspecified: Secondary | ICD-10-CM | POA: Diagnosis not present

## 2015-04-30 DIAGNOSIS — Z79899 Other long term (current) drug therapy: Secondary | ICD-10-CM | POA: Insufficient documentation

## 2015-04-30 DIAGNOSIS — Z9889 Other specified postprocedural states: Secondary | ICD-10-CM | POA: Insufficient documentation

## 2015-04-30 DIAGNOSIS — L509 Urticaria, unspecified: Secondary | ICD-10-CM

## 2015-04-30 DIAGNOSIS — R197 Diarrhea, unspecified: Secondary | ICD-10-CM | POA: Insufficient documentation

## 2015-04-30 DIAGNOSIS — E7800 Pure hypercholesterolemia, unspecified: Secondary | ICD-10-CM | POA: Insufficient documentation

## 2015-04-30 DIAGNOSIS — R945 Abnormal results of liver function studies: Secondary | ICD-10-CM

## 2015-04-30 DIAGNOSIS — R21 Rash and other nonspecific skin eruption: Secondary | ICD-10-CM | POA: Diagnosis present

## 2015-04-30 DIAGNOSIS — R112 Nausea with vomiting, unspecified: Secondary | ICD-10-CM | POA: Insufficient documentation

## 2015-04-30 DIAGNOSIS — Z7982 Long term (current) use of aspirin: Secondary | ICD-10-CM | POA: Diagnosis not present

## 2015-04-30 DIAGNOSIS — J069 Acute upper respiratory infection, unspecified: Secondary | ICD-10-CM | POA: Diagnosis not present

## 2015-04-30 DIAGNOSIS — N184 Chronic kidney disease, stage 4 (severe): Secondary | ICD-10-CM | POA: Diagnosis not present

## 2015-04-30 DIAGNOSIS — Z87891 Personal history of nicotine dependence: Secondary | ICD-10-CM | POA: Diagnosis not present

## 2015-04-30 DIAGNOSIS — Z8701 Personal history of pneumonia (recurrent): Secondary | ICD-10-CM | POA: Diagnosis not present

## 2015-04-30 LAB — COMPREHENSIVE METABOLIC PANEL
ALT: 130 U/L — ABNORMAL HIGH (ref 17–63)
AST: 169 U/L — ABNORMAL HIGH (ref 15–41)
Albumin: 4 g/dL (ref 3.5–5.0)
Alkaline Phosphatase: 116 U/L (ref 38–126)
Anion gap: 10 (ref 5–15)
BUN: 26 mg/dL — ABNORMAL HIGH (ref 6–20)
CO2: 22 mmol/L (ref 22–32)
Calcium: 9.2 mg/dL (ref 8.9–10.3)
Chloride: 102 mmol/L (ref 101–111)
Creatinine, Ser: 1.94 mg/dL — ABNORMAL HIGH (ref 0.61–1.24)
GFR calc Af Amer: 42 mL/min — ABNORMAL LOW (ref >60)
GFR calc non Af Amer: 36 mL/min — ABNORMAL LOW (ref >60)
Glucose, Bld: 108 mg/dL — ABNORMAL HIGH (ref 65–99)
Potassium: 4.2 mmol/L (ref 3.5–5.1)
Sodium: 134 mmol/L — ABNORMAL LOW (ref 135–145)
Total Bilirubin: 2 mg/dL — ABNORMAL HIGH (ref 0.3–1.2)
Total Protein: 7.2 g/dL (ref 6.5–8.1)

## 2015-04-30 LAB — CBC WITH DIFFERENTIAL/PLATELET
BASOS ABS: 0.1 10*3/uL (ref 0.0–0.1)
Basophils Relative: 1 %
EOS PCT: 7 %
Eosinophils Absolute: 0.5 10*3/uL (ref 0.0–0.7)
HEMATOCRIT: 43.4 % (ref 39.0–52.0)
Hemoglobin: 14.9 g/dL (ref 13.0–17.0)
LYMPHS PCT: 23 %
Lymphs Abs: 1.6 10*3/uL (ref 0.7–4.0)
MCH: 34.4 pg — ABNORMAL HIGH (ref 26.0–34.0)
MCHC: 34.3 g/dL (ref 30.0–36.0)
MCV: 100.2 fL — AB (ref 78.0–100.0)
MONO ABS: 0.7 10*3/uL (ref 0.1–1.0)
MONOS PCT: 10 %
Neutro Abs: 4.2 10*3/uL (ref 1.7–7.7)
Neutrophils Relative %: 59 %
PLATELETS: 212 10*3/uL (ref 150–400)
RBC: 4.33 MIL/uL (ref 4.22–5.81)
RDW: 13.3 % (ref 11.5–15.5)
WBC: 7.1 10*3/uL (ref 4.0–10.5)

## 2015-04-30 LAB — DIGOXIN LEVEL: Digoxin Level: <0.2 ng/mL — ABNORMAL LOW (ref 0.8–2.0)

## 2015-04-30 LAB — BRAIN NATRIURETIC PEPTIDE: B Natriuretic Peptide: 1557.1 pg/mL — ABNORMAL HIGH (ref 0.0–100.0)

## 2015-04-30 LAB — LIPASE, BLOOD: Lipase: 15 U/L — ABNORMAL LOW (ref 22–51)

## 2015-04-30 MED ORDER — ATORVASTATIN CALCIUM 20 MG PO TABS
20.0000 mg | ORAL_TABLET | Freq: Every day | ORAL | Status: DC
  Administered 2015-04-30: 20 mg via ORAL
  Filled 2015-04-30: qty 1

## 2015-04-30 MED ORDER — PREDNISONE 10 MG PO TABS: 40.0000 mg | ORAL_TABLET | Freq: Every day | ORAL | Status: AC

## 2015-04-30 MED ORDER — PREDNISONE 20 MG PO TABS
60.0000 mg | ORAL_TABLET | Freq: Once | ORAL | Status: AC
  Administered 2015-04-30: 60 mg via ORAL
  Filled 2015-04-30: qty 3

## 2015-04-30 MED ORDER — RANITIDINE HCL 150 MG PO CAPS
150.0000 mg | ORAL_CAPSULE | Freq: Two times a day (BID) | ORAL | Status: DC
Start: 2015-04-30 — End: 2015-05-29

## 2015-04-30 MED ORDER — DIPHENHYDRAMINE HCL 25 MG PO TABS: 25.0000 mg | ORAL_TABLET | Freq: Four times a day (QID) | ORAL | Status: DC

## 2015-04-30 MED ORDER — ONDANSETRON 4 MG PO TBDP
4.0000 mg | ORAL_TABLET | Freq: Once | ORAL | Status: AC
  Administered 2015-04-30: 4 mg via ORAL
  Filled 2015-04-30: qty 1

## 2015-04-30 MED ORDER — MENTHOL 3 MG MT LOZG
1.0000 | LOZENGE | OROMUCOSAL | Status: DC | PRN
  Filled 2015-04-30: qty 9

## 2015-04-30 MED ORDER — RANITIDINE HCL 150 MG PO CAPS: 150.0000 mg | ORAL_CAPSULE | Freq: Two times a day (BID) | ORAL | Status: DC

## 2015-04-30 MED ORDER — ASPIRIN 81 MG PO CHEW
81.0000 mg | CHEWABLE_TABLET | Freq: Once | ORAL | Status: AC
  Administered 2015-04-30: 81 mg via ORAL
  Filled 2015-04-30: qty 1

## 2015-04-30 MED ORDER — CARVEDILOL 3.125 MG PO TABS
3.1250 mg | ORAL_TABLET | Freq: Two times a day (BID) | ORAL | Status: DC
  Administered 2015-04-30: 3.125 mg via ORAL
  Filled 2015-04-30 (×2): qty 1

## 2015-04-30 MED ORDER — RANITIDINE HCL 150 MG/10ML PO SYRP
150.0000 mg | ORAL_SOLUTION | Freq: Once | ORAL | Status: AC
  Administered 2015-04-30: 150 mg via ORAL
  Filled 2015-04-30: qty 10

## 2015-04-30 MED ORDER — SODIUM CHLORIDE 0.9 % IV BOLUS (SEPSIS)
1000.0000 mL | Freq: Once | INTRAVENOUS | Status: AC
  Administered 2015-04-30: 1000 mL via INTRAVENOUS

## 2015-04-30 MED ORDER — CLOPIDOGREL BISULFATE 75 MG PO TABS
75.0000 mg | ORAL_TABLET | Freq: Once | ORAL | Status: AC
  Administered 2015-04-30: 75 mg via ORAL
  Filled 2015-04-30: qty 1

## 2015-04-30 MED ORDER — DIGOXIN 250 MCG PO TABS: 0.2500 mg | ORAL_TABLET | Freq: Once | ORAL | Status: DC

## 2015-04-30 NOTE — ED Provider Notes (Signed)
CSN: [id number]     Arrival date & time 04/30/15  1345 History   First MD Initiated Contact with Patient 04/30/15 1353     Chief Complaint  Patient presents with  . Allergic Reaction     (Consider location/radiation/quality/duration/timing/severity/associated sxs/prior Treatment) HPI Comments: Chronic emesis/cough, no change recently Rashx3 days, worsening, pruritic Welts over whole body Improved with benadryl en route No throat swelling, no change in voice, no drooling Sore throat x3 days +runny nose x3 days Cough chronic, all night long, no worse recently SOB chronic and unchanged Abdominal pain diffuse, worse epigastrium over last 2-3 days, however improved today Diarrhea this AM Stays at hotel, reports they wash sheets 1time/week    Past Medical History  Diagnosis Date  . Hypertension   . Homelessness   . Urinary hesitancy   . Hypercholesterolemia   . NSTEMI (non-ST elevated myocardial infarction) ([place]) 01/07/2015  . Heart attack ([place]) 07/2014  . Walking pneumonia 07/2014  . Headache     "probably weekly" (01/07/2015)  . Arthritis     "all over" (01/07/2015)  . Anxiety   . Depression   . Noncompliance   . Polysubstance abuse     etoh, cocaine  . Ischemic dilated cardiomyopathy   . CKD (chronic kidney disease), stage IV ([place])   . History of echocardiogram 12/2014    EF 20-25%   Past Surgical History  Procedure Laterality Date  . Left heart catheterization with coronary angiogram N/A 08/15/2014    Procedure: LEFT HEART CATHETERIZATION WITH CORONARY ANGIOGRAM;  Surgeon: Clent Demark, MD;  Location: [facility] CATH LAB;  Service: Cardiovascular;  Laterality: N/A;  . Cardiac catheterization     No family history on file. Social History  Substance Use Topics  . Smoking status: Former Smoker -- 1.50 packs/day for 30 years    Types: Cigarettes    Quit date: 02/19/2004  . Smokeless tobacco: Never Used  . Alcohol Use: Yes     Comment: social    Review of Systems   Constitutional: Negative for fever and appetite change.  HENT: Positive for congestion, rhinorrhea and sore throat. Negative for trouble swallowing and voice change.   Eyes: Negative for visual disturbance.  Respiratory: Positive for cough and shortness of breath (no change from chronic).   Cardiovascular: Negative for chest pain.  Gastrointestinal: Positive for nausea, vomiting (reports unchanged frmo baseline), abdominal pain and diarrhea. Negative for constipation and blood in stool.  Genitourinary: Negative for difficulty urinating.  Musculoskeletal: Negative for back pain and neck stiffness.  Skin: Negative for rash.  Neurological: Negative for syncope and headaches.      Allergies  Review of patient's allergies indicates no known allergies.  Home Medications   Prior to Admission medications   Medication Sig Start Date End Date Taking? Authorizing Provider  allopurinol (ZYLOPRIM) 300 MG tablet Take 1 tablet (300 mg total) by mouth daily. 03/24/15  Yes [name], MD  aspirin EC 81 MG tablet Take 81 mg by mouth daily.   Yes Historical Provider, MD  atorvastatin (LIPITOR) 80 MG tablet Take 80 mg by mouth daily.   Yes Historical Provider, MD  atorvastatin (LIPITOR) 80 MG tablet Take 80 mg by mouth daily.   Yes Historical Provider, MD  carvedilol (COREG) 3.125 MG tablet Take 1 tablet (3.125 mg total) by mouth 2 (two) times daily with a meal. Discontinue 6.25mg  03/24/15  Yes [name], MD  clopidogrel (PLAVIX) 75 MG tablet Take 1 tablet (75 mg total) by mouth daily. 01/09/15  Yes Charolette Forward, MD  colchicine 0.6 MG tablet Take 2 tabs (1.2mg ) PO at the onset of a gout attack, may repeat 1 tab(0.6mg ) an hour later, max 1.8mg  Patient taking differently: Take 1.2 mg by mouth daily as needed (gout). Take 2 tabs (1.2mg ) PO at the onset of a gout attack, may repeat 1 tab(0.6mg ) an hour later, max 1.8mg  03/24/15  Yes [name], MD  digoxin (LANOXIN) 0.25 MG tablet Take 1 tablet (0.25 mg  total) by mouth daily. 09/07/14  Yes Charolette Forward, MD  furosemide (LASIX) 40 MG tablet Take 1.5 tablets (60 mg total) by mouth 2 (two) times daily. 03/31/15  Yes [name], MD  isosorbide mononitrate (IMDUR) 30 MG 24 hr tablet Take 30 mg by mouth daily.   Yes Historical Provider, MD  isosorbide-hydrALAZINE (BIDIL) 20-37.5 MG per tablet Take 1 tablet by mouth 2 (two) times daily. 03/10/15  Yes Charolette Forward, MD  nitroGLYCERIN (NITROSTAT) 0.4 MG SL tablet Place 1 tablet (0.4 mg total) under the tongue every 5 (five) minutes x 3 doses as needed for chest pain. 08/16/14  Yes Dixie Dials, MD  ramipril (ALTACE) 1.25 MG capsule Take 1.25 mg by mouth daily.   Yes Historical Provider, MD  traMADol (ULTRAM) 50 MG tablet Take 1 tablet (50 mg total) by mouth every 12 (twelve) hours as needed. Patient taking differently: Take 50 mg by mouth every 12 (twelve) hours as needed for moderate pain.  03/31/15  Yes [name], MD  atorvastatin (LIPITOR) 20 MG tablet Take 1 tablet (20 mg total) by mouth daily. Patient not taking: Reported on 04/30/2015 09/07/14   Charolette Forward, MD  diphenhydrAMINE (BENADRYL) 25 MG tablet Take 1 tablet (25 mg total) by mouth every 6 (six) hours. 04/30/15 05/02/15  Gareth Morgan, MD  predniSONE (DELTASONE) 10 MG tablet Take 4 tablets (40 mg total) by mouth daily. 04/30/15 05/02/15  Gareth Morgan, MD  ranitidine (ZANTAC) 150 MG capsule Take 1 capsule (150 mg total) by mouth 2 (two) times daily. 04/30/15   Gareth Morgan, MD   BP 107/66 mmHg  Pulse 92  Temp(Src) 99.4 F (37.4 C) (Oral)  Resp 18  Ht 5\' 8"  (1.727 m)  Wt 151 lb (68.493 kg)  BMI 22.96 kg/m2  SpO2 99% Physical Exam  Constitutional: He is oriented to person, place, and time. He appears well-developed and well-nourished. No distress.  HENT:  Head: Normocephalic and atraumatic.  Mouth/Throat: Oropharynx is clear and moist. No oropharyngeal exudate.  Eyes: Conjunctivae and EOM are normal. Pupils are equal, round, and  reactive to light.  Neck: Normal range of motion.  Cardiovascular: Normal rate, regular rhythm, normal heart sounds and intact distal pulses.  Exam reveals no gallop and no friction rub.   No murmur heard. Pulmonary/Chest: Effort normal and breath sounds normal. No respiratory distress. He has no wheezes. He has no rales.  Abdominal: Soft. He exhibits no distension. There is tenderness (mild epigastric). There is no guarding.  Musculoskeletal: He exhibits no edema.  Neurological: He is alert and oriented to person, place, and time.  Skin: Skin is warm and dry. Rash (small urticaria over bilateral neck, erythematous papules over legs) noted. He is not diaphoretic.  Nursing note and vitals reviewed.   ED Course  Procedures (including critical care time) Labs Review Labs Reviewed  CBC WITH DIFFERENTIAL/PLATELET - Abnormal; Notable for the following:    MCV 100.2 (*)    MCH 34.4 (*)    All other components within normal limits  COMPREHENSIVE METABOLIC  PANEL - Abnormal; Notable for the following:    Sodium 134 (*)    Glucose, Bld 108 (*)    BUN 26 (*)    Creatinine, Ser 1.94 (*)    AST 169 (*)    ALT 130 (*)    Total Bilirubin 2.0 (*)    GFR calc non Af Amer 36 (*)    GFR calc Af Amer 42 (*)    All other components within normal limits  BRAIN NATRIURETIC PEPTIDE - Abnormal; Notable for the following:    B Natriuretic Peptide 1557.1 (*)    All other components within normal limits  DIGOXIN LEVEL - Abnormal; Notable for the following:    Digoxin Level <0.2 (*)    All other components within normal limits  LIPASE, BLOOD - Abnormal; Notable for the following:    Lipase 15 (*)    All other components within normal limits  HEPATITIS PANEL, ACUTE    Imaging Review Dg Chest 2 View  04/30/2015  CLINICAL DATA:  Cough, sore throat for 2 days EXAM: CHEST  2 VIEW COMPARISON:  03/22/2015 FINDINGS: There is no focal parenchymal opacity. There is no pleural effusion or pneumothorax. There is  stable cardiomegaly. The osseous structures are unremarkable. IMPRESSION: No active cardiopulmonary disease. Electronically Signed   By: Kathreen Devoid   On: 04/30/2015 15:35   US Abdomen Limited Ruq  04/30/2015  CLINICAL DATA:  Elevated liver function tests. EXAM: US ABDOMEN LIMITED - RIGHT UPPER QUADRANT COMPARISON:  04/23/2015 FINDINGS: Gallbladder: Mildly thickened gallbladder wall at 3 mm but probably due to nondistention given the small caliber of the gallbladder. Sonographic Murphy sign indeterminate due to pain medication. No discrete gallstone identified. Common bile duct: Diameter: 3 mm Liver: No focal lesion identified. Within normal limits in parenchymal echogenicity. IMPRESSION: 1. The mild gallbladder wall thickening is probably attributable 2 nondistention/contraction. No gallstones are observed. Sonographic Murphy's sign is technically indeterminate due to pain medication. Electronically Signed   By: Van Clines M.D.   On: 04/30/2015 18:57   I have personally reviewed and evaluated these images and lab results as part of my medical decision-making.   EKG Interpretation   Date/Time:  Thursday April 30 2015 15:41:53 EDT Ventricular Rate:  109 PR Interval:  183 QRS Duration: 109 QT Interval:  360 QTC Calculation: 485 R Axis:   -95 Text Interpretation:  Sinus tachycardia Probable left atrial enlargement  Left anterior fascicular block Abnormal R-wave progression, early  transition No significant change since last tracing Confirmed by  [facility] MD, Keonda Dow ([id number]) on 04/30/2015 3:47:09 PM      MDM   Final diagnoses:  Elevated LFTs  Urticaria  Itching  URI (upper respiratory infection)   59 year old male with a history of ACS, CHF with an ejection fraction of 20%, hypertension, gout presents with concern for an diffuse pruritic rash, URI symptoms and fatigue.  Patient also reports diffuse abdominal pain over last 3 days which has now improved. Patient received 50 mg  of Benadryl with EMS reports improvement of rash. While patient does describe symptoms of nausea, vomiting and shortness of breath reports that these are chronic symptoms that are unchanged since the beginning of his rash. Given these symptoms are unchanged from patient's prior with hx of heart disease, have decreased suspicion for anaphylaxis, and will not give epinephrine at this time. Patient's rash continued to improve in the emergency department. He is given 40 mg of prednisone, and Zantac.  He was observed for 4 hours without  development of further signs of anaphylaxis.  As patient describes some worsening fatigue, over the last few days, labs are obtained showing elevated AST to 169, AST 1:30, bili of 2, which are increased from previous. Patient has no known history of liver disease, no history of acetaminophen use. We'll send hepatitis panel,  right upper quadrant ultrasound given patient's description of recent abdominal pain.   BNP is decreased from prior, chest x-ray shows no signs of acute pulmonary edema.    Right upper quadrant ultrasound obtained shows contraction, with thickening likely secondary to this. There is no sign of RUQ fluid, , no sign of biliary ductal dilation, and patient continues to be free of abdominal pain.  Have low suspicion for cholecystitis based off of combination of ultrasound findings and exam.  Recommend close PCP follow-up regarding AST/ALT and bilirubin, and patient states understanding of this.  Regarding pruritus, recommended scheduling Benadryl every 6 hours, continuing Zantac and prednisone for 2 days.  Discussed symptoms of anaphylaxis with patient in detail and reasons to return to the emergency department. Patient did not develop any worsening of symptoms, and is hemodynamically stable prior to discharge.  Recommend treatment or urticaria, and follow up with PCP for elevated AST/ALT. Patient discharged in stable condition with understanding of reasons to  return.     Gareth Morgan, MD 04/30/15 1946

## 2015-04-30 NOTE — ED Notes (Signed)
Bed: [id number] Expected date:  Expected time:  Means of arrival:  Comments: 66-ems,weakness, hives

## 2015-04-30 NOTE — ED Notes (Signed)
Per EMS patient complains of cough and weakness, and hives all over his body since yesterday. Pt does not know source of hives. Nausea/vomiting upon EMS arrival. 50 mg PO Benadryl with no relief PTA. BP 110/70 Right bundle branch block on EMS EKG.

## 2015-05-01 LAB — HEPATITIS PANEL, ACUTE
HCV Ab: <0.1 s/co ratio (ref 0.0–0.9)
Hep A IgM: NEGATIVE
Hep B C IgM: NEGATIVE
Hepatitis B Surface Ag: NEGATIVE

## 2015-05-03 ENCOUNTER — Emergency Department (HOSPITAL_COMMUNITY): Payer: Medicaid Other

## 2015-05-03 ENCOUNTER — Inpatient Hospital Stay (HOSPITAL_COMMUNITY)
Admission: EM | Admit: 2015-05-03 | Discharge: 2015-05-29 | DRG: 853 | Disposition: A | Discharge status: Home / Self Care | Payer: Medicaid Other | Attending: Internal Medicine | Admitting: Internal Medicine

## 2015-05-03 ENCOUNTER — Encounter (HOSPITAL_COMMUNITY): Payer: Self-pay

## 2015-05-03 DIAGNOSIS — L03221 Cellulitis of neck: Secondary | ICD-10-CM | POA: Diagnosis present

## 2015-05-03 DIAGNOSIS — J40 Bronchitis, not specified as acute or chronic: Secondary | ICD-10-CM | POA: Diagnosis present

## 2015-05-03 DIAGNOSIS — Z7902 Long term (current) use of antithrombotics/antiplatelets: Secondary | ICD-10-CM

## 2015-05-03 DIAGNOSIS — D72829 Elevated white blood cell count, unspecified: Secondary | ICD-10-CM | POA: Diagnosis present

## 2015-05-03 DIAGNOSIS — R319 Hematuria, unspecified: Secondary | ICD-10-CM | POA: Diagnosis not present

## 2015-05-03 DIAGNOSIS — T380X5A Adverse effect of glucocorticoids and synthetic analogues, initial encounter: Secondary | ICD-10-CM | POA: Diagnosis not present

## 2015-05-03 DIAGNOSIS — Z79899 Other long term (current) drug therapy: Secondary | ICD-10-CM

## 2015-05-03 DIAGNOSIS — R6521 Severe sepsis with septic shock: Secondary | ICD-10-CM | POA: Insufficient documentation

## 2015-05-03 DIAGNOSIS — R1310 Dysphagia, unspecified: Secondary | ICD-10-CM

## 2015-05-03 DIAGNOSIS — D649 Anemia, unspecified: Secondary | ICD-10-CM | POA: Diagnosis present

## 2015-05-03 DIAGNOSIS — I2510 Atherosclerotic heart disease of native coronary artery without angina pectoris: Secondary | ICD-10-CM | POA: Diagnosis present

## 2015-05-03 DIAGNOSIS — J9600 Acute respiratory failure, unspecified whether with hypoxia or hypercapnia: Secondary | ICD-10-CM | POA: Insufficient documentation

## 2015-05-03 DIAGNOSIS — K1120 Sialoadenitis, unspecified: Secondary | ICD-10-CM | POA: Insufficient documentation

## 2015-05-03 DIAGNOSIS — B952 Enterococcus as the cause of diseases classified elsewhere: Secondary | ICD-10-CM | POA: Diagnosis present

## 2015-05-03 DIAGNOSIS — R778 Other specified abnormalities of plasma proteins: Secondary | ICD-10-CM | POA: Insufficient documentation

## 2015-05-03 DIAGNOSIS — I10 Essential (primary) hypertension: Secondary | ICD-10-CM | POA: Diagnosis present

## 2015-05-03 DIAGNOSIS — L89152 Pressure ulcer of sacral region, stage 2: Secondary | ICD-10-CM | POA: Diagnosis present

## 2015-05-03 DIAGNOSIS — N179 Acute kidney failure, unspecified: Secondary | ICD-10-CM | POA: Insufficient documentation

## 2015-05-03 DIAGNOSIS — R7989 Other specified abnormal findings of blood chemistry: Secondary | ICD-10-CM | POA: Insufficient documentation

## 2015-05-03 DIAGNOSIS — R509 Fever, unspecified: Secondary | ICD-10-CM | POA: Insufficient documentation

## 2015-05-03 DIAGNOSIS — R1110 Vomiting, unspecified: Secondary | ICD-10-CM

## 2015-05-03 DIAGNOSIS — L0390 Cellulitis, unspecified: Secondary | ICD-10-CM

## 2015-05-03 DIAGNOSIS — N184 Chronic kidney disease, stage 4 (severe): Secondary | ICD-10-CM | POA: Diagnosis present

## 2015-05-03 DIAGNOSIS — I340 Nonrheumatic mitral (valve) insufficiency: Secondary | ICD-10-CM | POA: Diagnosis present

## 2015-05-03 DIAGNOSIS — M109 Gout, unspecified: Secondary | ICD-10-CM | POA: Diagnosis present

## 2015-05-03 DIAGNOSIS — J9811 Atelectasis: Secondary | ICD-10-CM | POA: Diagnosis not present

## 2015-05-03 DIAGNOSIS — A4159 Other Gram-negative sepsis: Principal | ICD-10-CM | POA: Diagnosis present

## 2015-05-03 DIAGNOSIS — N189 Chronic kidney disease, unspecified: Secondary | ICD-10-CM

## 2015-05-03 DIAGNOSIS — F1910 Other psychoactive substance abuse, uncomplicated: Secondary | ICD-10-CM | POA: Diagnosis present

## 2015-05-03 DIAGNOSIS — R21 Rash and other nonspecific skin eruption: Secondary | ICD-10-CM | POA: Insufficient documentation

## 2015-05-03 DIAGNOSIS — E871 Hypo-osmolality and hyponatremia: Secondary | ICD-10-CM | POA: Diagnosis present

## 2015-05-03 DIAGNOSIS — I129 Hypertensive chronic kidney disease with stage 1 through stage 4 chronic kidney disease, or unspecified chronic kidney disease: Secondary | ICD-10-CM | POA: Diagnosis present

## 2015-05-03 DIAGNOSIS — J9601 Acute respiratory failure with hypoxia: Secondary | ICD-10-CM | POA: Diagnosis not present

## 2015-05-03 DIAGNOSIS — R7881 Bacteremia: Secondary | ICD-10-CM | POA: Insufficient documentation

## 2015-05-03 DIAGNOSIS — E785 Hyperlipidemia, unspecified: Secondary | ICD-10-CM | POA: Diagnosis present

## 2015-05-03 DIAGNOSIS — Z09 Encounter for follow-up examination after completed treatment for conditions other than malignant neoplasm: Secondary | ICD-10-CM

## 2015-05-03 DIAGNOSIS — E875 Hyperkalemia: Secondary | ICD-10-CM | POA: Diagnosis present

## 2015-05-03 DIAGNOSIS — R609 Edema, unspecified: Secondary | ICD-10-CM | POA: Diagnosis present

## 2015-05-03 DIAGNOSIS — D721 Eosinophilia, unspecified: Secondary | ICD-10-CM | POA: Insufficient documentation

## 2015-05-03 DIAGNOSIS — R7401 Elevation of levels of liver transaminase levels: Secondary | ICD-10-CM

## 2015-05-03 DIAGNOSIS — E162 Hypoglycemia, unspecified: Secondary | ICD-10-CM | POA: Diagnosis not present

## 2015-05-03 DIAGNOSIS — Z5900 Homelessness unspecified: Secondary | ICD-10-CM

## 2015-05-03 DIAGNOSIS — Z6824 Body mass index (BMI) 24.0-24.9, adult: Secondary | ICD-10-CM

## 2015-05-03 DIAGNOSIS — T360X5A Adverse effect of penicillins, initial encounter: Secondary | ICD-10-CM | POA: Diagnosis not present

## 2015-05-03 DIAGNOSIS — M1990 Unspecified osteoarthritis, unspecified site: Secondary | ICD-10-CM | POA: Diagnosis present

## 2015-05-03 DIAGNOSIS — M869 Osteomyelitis, unspecified: Secondary | ICD-10-CM | POA: Diagnosis present

## 2015-05-03 DIAGNOSIS — D696 Thrombocytopenia, unspecified: Secondary | ICD-10-CM | POA: Diagnosis not present

## 2015-05-03 DIAGNOSIS — L509 Urticaria, unspecified: Secondary | ICD-10-CM | POA: Insufficient documentation

## 2015-05-03 DIAGNOSIS — N183 Chronic kidney disease, stage 3 unspecified: Secondary | ICD-10-CM | POA: Diagnosis present

## 2015-05-03 DIAGNOSIS — N170 Acute kidney failure with tubular necrosis: Secondary | ICD-10-CM | POA: Diagnosis not present

## 2015-05-03 DIAGNOSIS — R109 Unspecified abdominal pain: Secondary | ICD-10-CM

## 2015-05-03 DIAGNOSIS — Z9119 Patient's noncompliance with other medical treatment and regimen: Secondary | ICD-10-CM

## 2015-05-03 DIAGNOSIS — R221 Localized swelling, mass and lump, neck: Secondary | ICD-10-CM | POA: Diagnosis not present

## 2015-05-03 DIAGNOSIS — Z452 Encounter for adjustment and management of vascular access device: Secondary | ICD-10-CM

## 2015-05-03 DIAGNOSIS — Z7982 Long term (current) use of aspirin: Secondary | ICD-10-CM

## 2015-05-03 DIAGNOSIS — N320 Bladder-neck obstruction: Secondary | ICD-10-CM | POA: Diagnosis not present

## 2015-05-03 DIAGNOSIS — R682 Dry mouth, unspecified: Secondary | ICD-10-CM | POA: Insufficient documentation

## 2015-05-03 DIAGNOSIS — Z91199 Patient's noncompliance with other medical treatment and regimen due to unspecified reason: Secondary | ICD-10-CM

## 2015-05-03 DIAGNOSIS — E874 Mixed disorder of acid-base balance: Secondary | ICD-10-CM | POA: Diagnosis present

## 2015-05-03 DIAGNOSIS — IMO0002 Reserved for concepts with insufficient information to code with codable children: Secondary | ICD-10-CM | POA: Insufficient documentation

## 2015-05-03 DIAGNOSIS — I252 Old myocardial infarction: Secondary | ICD-10-CM

## 2015-05-03 DIAGNOSIS — R0600 Dyspnea, unspecified: Secondary | ICD-10-CM

## 2015-05-03 DIAGNOSIS — Z8249 Family history of ischemic heart disease and other diseases of the circulatory system: Secondary | ICD-10-CM

## 2015-05-03 DIAGNOSIS — A498 Other bacterial infections of unspecified site: Secondary | ICD-10-CM | POA: Insufficient documentation

## 2015-05-03 DIAGNOSIS — N19 Unspecified kidney failure: Secondary | ICD-10-CM

## 2015-05-03 DIAGNOSIS — L8990 Pressure ulcer of unspecified site, unspecified stage: Secondary | ICD-10-CM | POA: Insufficient documentation

## 2015-05-03 DIAGNOSIS — J9690 Respiratory failure, unspecified, unspecified whether with hypoxia or hypercapnia: Secondary | ICD-10-CM

## 2015-05-03 DIAGNOSIS — R740 Nonspecific elevation of levels of transaminase and lactic acid dehydrogenase [LDH]: Secondary | ICD-10-CM

## 2015-05-03 DIAGNOSIS — A419 Sepsis, unspecified organism: Secondary | ICD-10-CM | POA: Diagnosis present

## 2015-05-03 DIAGNOSIS — I5042 Chronic combined systolic (congestive) and diastolic (congestive) heart failure: Secondary | ICD-10-CM | POA: Diagnosis present

## 2015-05-03 DIAGNOSIS — I255 Ischemic cardiomyopathy: Secondary | ICD-10-CM | POA: Diagnosis present

## 2015-05-03 DIAGNOSIS — G9340 Encephalopathy, unspecified: Secondary | ICD-10-CM | POA: Diagnosis not present

## 2015-05-03 DIAGNOSIS — E43 Unspecified severe protein-calorie malnutrition: Secondary | ICD-10-CM | POA: Diagnosis present

## 2015-05-03 DIAGNOSIS — Z978 Presence of other specified devices: Secondary | ICD-10-CM | POA: Insufficient documentation

## 2015-05-03 DIAGNOSIS — Z87891 Personal history of nicotine dependence: Secondary | ICD-10-CM

## 2015-05-03 DIAGNOSIS — K7200 Acute and subacute hepatic failure without coma: Secondary | ICD-10-CM | POA: Diagnosis present

## 2015-05-03 DIAGNOSIS — R945 Abnormal results of liver function studies: Secondary | ICD-10-CM

## 2015-05-03 LAB — COMPREHENSIVE METABOLIC PANEL
ALBUMIN: 3 g/dL — AB (ref 3.5–5.0)
ALK PHOS: 104 U/L (ref 38–126)
ALT: 283 U/L — ABNORMAL HIGH (ref 17–63)
ALT: 229 U/L — ABNORMAL HIGH (ref 17–63)
ANION GAP: 12 (ref 5–15)
ANION GAP: 8 (ref 5–15)
AST: 299 U/L — ABNORMAL HIGH (ref 15–41)
AST: 226 U/L — ABNORMAL HIGH (ref 15–41)
Albumin: 3.7 g/dL (ref 3.5–5.0)
Alkaline Phosphatase: 78 U/L (ref 38–126)
BILIRUBIN TOTAL: 3.9 mg/dL — AB (ref 0.3–1.2)
BILIRUBIN TOTAL: 3 mg/dL — AB (ref 0.3–1.2)
BUN: 40 mg/dL — ABNORMAL HIGH (ref 6–20)
BUN: 45 mg/dL — ABNORMAL HIGH (ref 6–20)
CALCIUM: 8.9 mg/dL (ref 8.9–10.3)
CO2: 18 mmol/L — ABNORMAL LOW (ref 22–32)
CO2: 20 mmol/L — ABNORMAL LOW (ref 22–32)
Calcium: 8 mg/dL — ABNORMAL LOW (ref 8.9–10.3)
Chloride: 101 mmol/L (ref 101–111)
Chloride: 103 mmol/L (ref 101–111)
Creatinine, Ser: 2.26 mg/dL — ABNORMAL HIGH (ref 0.61–1.24)
Creatinine, Ser: 2.31 mg/dL — ABNORMAL HIGH (ref 0.61–1.24)
GFR calc Af Amer: 34 mL/min — ABNORMAL LOW (ref >60)
GFR, EST AFRICAN AMERICAN: 35 mL/min — AB (ref >60)
GFR, EST NON AFRICAN AMERICAN: 30 mL/min — AB (ref >60)
GFR, EST NON AFRICAN AMERICAN: 29 mL/min — AB (ref >60)
GLUCOSE: 98 mg/dL (ref 65–99)
Glucose, Bld: 63 mg/dL — ABNORMAL LOW (ref 65–99)
POTASSIUM: 4.4 mmol/L (ref 3.5–5.1)
Potassium: 4.8 mmol/L (ref 3.5–5.1)
SODIUM: 131 mmol/L — AB (ref 135–145)
Sodium: 131 mmol/L — ABNORMAL LOW (ref 135–145)
TOTAL PROTEIN: 6.9 g/dL (ref 6.5–8.1)
TOTAL PROTEIN: 5.5 g/dL — AB (ref 6.5–8.1)

## 2015-05-03 LAB — ETHANOL

## 2015-05-03 LAB — CBC WITH DIFFERENTIAL/PLATELET
BASOS ABS: 0.2 10*3/uL — AB (ref 0.0–0.1)
BASOS ABS: 0.2 10*3/uL — AB (ref 0.0–0.1)
Basophils Relative: 1 %
Basophils Relative: 1 %
EOS ABS: 1.6 10*3/uL — AB (ref 0.0–0.7)
Eosinophils Absolute: 1.8 10*3/uL — ABNORMAL HIGH (ref 0.0–0.7)
Eosinophils Relative: 11 %
Eosinophils Relative: 9 %
HCT: 41.7 % (ref 39.0–52.0)
HEMATOCRIT: 51.4 % (ref 39.0–52.0)
HEMOGLOBIN: 17.8 g/dL — AB (ref 13.0–17.0)
Hemoglobin: 14.4 g/dL (ref 13.0–17.0)
LYMPHS ABS: 3.4 10*3/uL (ref 0.7–4.0)
Lymphocytes Relative: 20 %
Lymphocytes Relative: 17 %
Lymphs Abs: 3 10*3/uL (ref 0.7–4.0)
MCH: 34.6 pg — AB (ref 26.0–34.0)
MCH: 34.6 pg — ABNORMAL HIGH (ref 26.0–34.0)
MCHC: 34.6 g/dL (ref 30.0–36.0)
MCHC: 34.5 g/dL (ref 30.0–36.0)
MCV: 99.8 fL (ref 78.0–100.0)
MCV: 100.2 fL — ABNORMAL HIGH (ref 78.0–100.0)
MONO ABS: 1.2 10*3/uL — AB (ref 0.1–1.0)
MONOS PCT: 6 %
Monocytes Absolute: 1 10*3/uL (ref 0.1–1.0)
Monocytes Relative: 7 %
NEUTROS ABS: 10.4 10*3/uL — AB (ref 1.7–7.7)
NEUTROS PCT: 66 %
Neutro Abs: 11.4 10*3/uL — ABNORMAL HIGH (ref 1.7–7.7)
Neutrophils Relative %: 62 %
PLATELETS: 230 10*3/uL (ref 150–400)
Platelets: 253 10*3/uL (ref 150–400)
RBC: 5.15 MIL/uL (ref 4.22–5.81)
RBC: 4.16 MIL/uL — AB (ref 4.22–5.81)
RDW: 13.5 % (ref 11.5–15.5)
RDW: 13.6 % (ref 11.5–15.5)
WBC: 16.8 10*3/uL — ABNORMAL HIGH (ref 4.0–10.5)
WBC: 17.4 10*3/uL — AB (ref 4.0–10.5)

## 2015-05-03 LAB — TSH: TSH: 3.213 u[IU]/mL (ref 0.350–4.500)

## 2015-05-03 LAB — RAPID URINE DRUG SCREEN, HOSP PERFORMED
AMPHETAMINES: NOT DETECTED
BENZODIAZEPINES: NOT DETECTED
Barbiturates: NOT DETECTED
Cocaine: NOT DETECTED
Opiates: NOT DETECTED
Tetrahydrocannabinol: NOT DETECTED

## 2015-05-03 LAB — I-STAT TROPONIN, ED: TROPONIN I, POC: 0.05 ng/mL (ref 0.00–0.08)

## 2015-05-03 LAB — LIPASE, BLOOD: LIPASE: 16 U/L — AB (ref 22–51)

## 2015-05-03 LAB — MAGNESIUM: MAGNESIUM: 1.9 mg/dL (ref 1.7–2.4)

## 2015-05-03 LAB — BRAIN NATRIURETIC PEPTIDE: B Natriuretic Peptide: 752.1 pg/mL — ABNORMAL HIGH (ref 0.0–100.0)

## 2015-05-03 LAB — APTT: APTT: 32 s (ref 24–37)

## 2015-05-03 LAB — PHOSPHORUS: PHOSPHORUS: 3.8 mg/dL (ref 2.5–4.6)

## 2015-05-03 MED ORDER — CLINDAMYCIN PHOSPHATE 600 MG/50ML IV SOLN
600.0000 mg | Freq: Once | INTRAVENOUS | Status: AC
  Administered 2015-05-03: 600 mg via INTRAVENOUS
  Filled 2015-05-03: qty 50

## 2015-05-03 MED ORDER — TRAMADOL HCL 50 MG PO TABS: 50.0000 mg | ORAL_TABLET | Freq: Two times a day (BID) | ORAL | Status: DC | PRN

## 2015-05-03 MED ORDER — CARVEDILOL 3.125 MG PO TABS
3.1250 mg | ORAL_TABLET | Freq: Two times a day (BID) | ORAL | Status: DC
  Administered 2015-05-03 – 2015-05-09 (×9): 3.125 mg via ORAL
  Filled 2015-05-03 (×12): qty 1

## 2015-05-03 MED ORDER — ASPIRIN EC 81 MG PO TBEC
81.0000 mg | DELAYED_RELEASE_TABLET | Freq: Every day | ORAL | Status: DC
  Administered 2015-05-03 – 2015-05-13 (×11): 81 mg via ORAL
  Filled 2015-05-03 (×12): qty 1

## 2015-05-03 MED ORDER — SODIUM CHLORIDE 0.9 % IJ SOLN
3.0000 mL | Freq: Two times a day (BID) | INTRAMUSCULAR | Status: DC
  Administered 2015-05-03 – 2015-05-20 (×17): 3 mL via INTRAVENOUS
  Administered 2015-05-22: 10 mL via INTRAVENOUS
  Administered 2015-05-24 – 2015-05-29 (×6): 3 mL via INTRAVENOUS

## 2015-05-03 MED ORDER — COLCHICINE 0.6 MG PO TABS
1.2000 mg | ORAL_TABLET | Freq: Every day | ORAL | Status: DC | PRN
  Filled 2015-05-03: qty 2

## 2015-05-03 MED ORDER — ACETAMINOPHEN 650 MG RE SUPP: 650.0000 mg | Freq: Four times a day (QID) | RECTAL | Status: DC | PRN

## 2015-05-03 MED ORDER — MIRABEGRON ER 25 MG PO TB24
25.0000 mg | ORAL_TABLET | Freq: Every day | ORAL | Status: DC
  Administered 2015-05-03 – 2015-05-16 (×12): 25 mg via ORAL
  Filled 2015-05-03 (×19): qty 1

## 2015-05-03 MED ORDER — FAMOTIDINE 20 MG PO TABS
20.0000 mg | ORAL_TABLET | Freq: Every day | ORAL | Status: DC
  Administered 2015-05-03 – 2015-05-22 (×20): 20 mg via ORAL
  Filled 2015-05-03 (×20): qty 1

## 2015-05-03 MED ORDER — ONDANSETRON HCL 4 MG PO TABS
4.0000 mg | ORAL_TABLET | Freq: Four times a day (QID) | ORAL | Status: DC | PRN
  Administered 2015-05-13: 4 mg via ORAL
  Filled 2015-05-03: qty 1

## 2015-05-03 MED ORDER — CLOPIDOGREL BISULFATE 75 MG PO TABS
75.0000 mg | ORAL_TABLET | Freq: Every day | ORAL | Status: DC
  Administered 2015-05-03 – 2015-05-24 (×22): 75 mg via ORAL
  Filled 2015-05-03 (×21): qty 1

## 2015-05-03 MED ORDER — CLINDAMYCIN PHOSPHATE 600 MG/50ML IV SOLN
600.0000 mg | Freq: Three times a day (TID) | INTRAVENOUS | Status: DC
  Administered 2015-05-03: 600 mg via INTRAVENOUS
  Filled 2015-05-03: qty 50

## 2015-05-03 MED ORDER — ONDANSETRON HCL 4 MG/2ML IJ SOLN
4.0000 mg | Freq: Four times a day (QID) | INTRAMUSCULAR | Status: DC | PRN
  Administered 2015-05-07 – 2015-05-21 (×3): 4 mg via INTRAVENOUS
  Filled 2015-05-03 (×3): qty 2

## 2015-05-03 MED ORDER — SODIUM CHLORIDE 0.9 % IV BOLUS (SEPSIS)
1000.0000 mL | Freq: Once | INTRAVENOUS | Status: AC
  Administered 2015-05-03: 1000 mL via INTRAVENOUS

## 2015-05-03 MED ORDER — ATORVASTATIN CALCIUM 40 MG PO TABS
80.0000 mg | ORAL_TABLET | Freq: Every day | ORAL | Status: DC
  Administered 2015-05-03 – 2015-05-06 (×4): 80 mg via ORAL
  Filled 2015-05-03 (×4): qty 2

## 2015-05-03 MED ORDER — DIPHENHYDRAMINE HCL 50 MG/ML IJ SOLN
12.5000 mg | Freq: Once | INTRAMUSCULAR | Status: AC
  Administered 2015-05-03: 12.5 mg via INTRAVENOUS
  Filled 2015-05-03: qty 1

## 2015-05-03 MED ORDER — ACETAMINOPHEN 325 MG PO TABS
650.0000 mg | ORAL_TABLET | Freq: Four times a day (QID) | ORAL | Status: DC | PRN
  Administered 2015-05-04 – 2015-05-14 (×6): 650 mg via ORAL
  Filled 2015-05-03 (×6): qty 2

## 2015-05-03 MED ORDER — DIPHENHYDRAMINE HCL 25 MG PO CAPS
25.0000 mg | ORAL_CAPSULE | Freq: Four times a day (QID) | ORAL | Status: DC
  Administered 2015-05-03 – 2015-05-16 (×44): 25 mg via ORAL
  Filled 2015-05-03 (×48): qty 1

## 2015-05-03 NOTE — ED Notes (Signed)
Pt reports he has not taken his daily meds today.

## 2015-05-03 NOTE — ED Provider Notes (Signed)
CSN: [id number]     Arrival date & time 05/03/15  [id number] History   First MD Initiated Contact with Patient 05/03/15 [phone]     Chief Complaint  Patient presents with  . Allergic Reaction     (Consider location/radiation/quality/duration/timing/severity/associated sxs/prior Treatment) The history is provided by the patient.  [name] is a 59 y.o. male hx of HTN, homeless, CHF, CKD, here presenting with rash, neck swelling. Patient states that he came here a week ago for rash. Workup in the ER showed elevated LFTs in the 100s, bilirubin 2.0. Patient had normal right upper quadrant ultrasound as well as normal acute hepatitis series. Has been taking benadryl with no relief. Noticed that neck more swelling for 2-3 days. Denies trouble breathing but has baseline shortness of breath from CHF. Denies chest pain.    Past Medical History  Diagnosis Date  . Hypertension   . Homelessness   . Urinary hesitancy   . Hypercholesterolemia   . NSTEMI (non-ST elevated myocardial infarction) ([place]) 01/07/2015  . Heart attack ([place]) 07/2014  . Walking pneumonia 07/2014  . Headache     "probably weekly" (01/07/2015)  . Arthritis     "all over" (01/07/2015)  . Anxiety   . Depression   . Noncompliance   . Polysubstance abuse     etoh, cocaine  . Ischemic dilated cardiomyopathy   . CKD (chronic kidney disease), stage IV ([place])   . History of echocardiogram 12/2014    EF 20-25%   Past Surgical History  Procedure Laterality Date  . Left heart catheterization with coronary angiogram N/A 08/15/2014    Procedure: LEFT HEART CATHETERIZATION WITH CORONARY ANGIOGRAM;  Surgeon: Clent Demark, MD;  Location: [facility] CATH LAB;  Service: Cardiovascular;  Laterality: N/A;  . Cardiac catheterization     History reviewed. No pertinent family history. Social History  Substance Use Topics  . Smoking status: Former Smoker -- 1.50 packs/day for 30 years    Types: Cigarettes    Quit date: 02/19/2004  . Smokeless tobacco:  Never Used  . Alcohol Use: Yes     Comment: social    Review of Systems  HENT:       Neck swelling   Skin: Positive for rash.  All other systems reviewed and are negative.     Allergies  Review of patient's allergies indicates no known allergies.  Home Medications   Prior to Admission medications   Medication Sig Start Date End Date Taking? Authorizing Provider  aspirin EC 81 MG tablet Take 81 mg by mouth daily.   Yes Historical Provider, MD  atorvastatin (LIPITOR) 80 MG tablet Take 80 mg by mouth daily.   Yes Historical Provider, MD  carvedilol (COREG) 3.125 MG tablet Take 1 tablet (3.125 mg total) by mouth 2 (two) times daily with a meal. Discontinue 6.25mg  03/24/15  Yes [name], MD  clopidogrel (PLAVIX) 75 MG tablet Take 1 tablet (75 mg total) by mouth daily. 01/09/15  Yes Charolette Forward, MD  colchicine 0.6 MG tablet Take 2 tabs (1.2mg ) PO at the onset of a gout attack, may repeat 1 tab(0.6mg ) an hour later, max 1.8mg  Patient taking differently: Take 1.2 mg by mouth daily as needed (gout). Take 2 tabs (1.2mg ) PO at the onset of a gout attack, may repeat 1 tab(0.6mg ) an hour later, max 1.8mg  03/24/15  Yes [name], MD  diphenhydrAMINE (BENADRYL) 25 MG tablet Take 1 tablet (25 mg total) by mouth every 6 (six) hours. 04/30/15 05/03/15 Yes Gareth Morgan, MD  furosemide (LASIX) 40 MG tablet Take 1.5 tablets (60 mg total) by mouth 2 (two) times daily. 03/31/15  Yes [name], MD  isosorbide mononitrate (IMDUR) 30 MG 24 hr tablet Take 15-30 mg by mouth daily.    Yes Historical Provider, MD  isosorbide-hydrALAZINE (BIDIL) 20-37.5 MG per tablet Take 1 tablet by mouth 2 (two) times daily. 03/10/15  Yes Charolette Forward, MD  mirabegron ER (MYRBETRIQ) 25 MG TB24 tablet Take 25 mg by mouth daily.   Yes Historical Provider, MD  nitroGLYCERIN (NITROSTAT) 0.4 MG SL tablet Place 1 tablet (0.4 mg total) under the tongue every 5 (five) minutes x 3 doses as needed for chest pain. 08/16/14  Yes Dixie Dials, MD  ramipril (ALTACE) 1.25 MG capsule Take 1.25 mg by mouth daily.   Yes Historical Provider, MD  ranitidine (ZANTAC) 150 MG capsule Take 1 capsule (150 mg total) by mouth 2 (two) times daily. 04/30/15  Yes Gareth Morgan, MD  traMADol (ULTRAM) 50 MG tablet Take 1 tablet (50 mg total) by mouth every 12 (twelve) hours as needed. Patient taking differently: Take 50 mg by mouth every 12 (twelve) hours as needed for moderate pain.  03/31/15  Yes [name], MD  allopurinol (ZYLOPRIM) 300 MG tablet Take 1 tablet (300 mg total) by mouth daily. Patient not taking: Reported on 05/03/2015 03/24/15   [name], MD  atorvastatin (LIPITOR) 20 MG tablet Take 1 tablet (20 mg total) by mouth daily. Patient not taking: Reported on 04/30/2015 09/07/14   Charolette Forward, MD  digoxin (LANOXIN) 0.25 MG tablet Take 1 tablet (0.25 mg total) by mouth daily. Patient not taking: Reported on 05/03/2015 09/07/14   Charolette Forward, MD   BP 97/63 mmHg  Pulse 91  Temp(Src) 98.8 F (37.1 C) (Oral)  Resp 16  Ht 5\' 8"  (1.727 m)  Wt 152 lb (68.947 kg)  BMI 23.12 kg/m2  SpO2 96% Physical Exam  Constitutional: He is oriented to person, place, and time.  Chronically ill   HENT:  Head: Normocephalic.  Mouth/Throat: Oropharynx is clear and moist.  Eyes: Conjunctivae are normal. Pupils are equal, round, and reactive to light.  Neck:  Bilateral neck swelling, worse on R side. No stridor   Cardiovascular: Normal rate, regular rhythm and normal heart sounds.   Pulmonary/Chest: Effort normal.  Diminished bilateral bases   Abdominal: Soft. Bowel sounds are normal. He exhibits no distension. There is no tenderness. There is no rebound.  Musculoskeletal:  1+ edema   Neurological: He is alert and oriented to person, place, and time.  Skin: Skin is warm and dry.  Diffuse urticaria mostly on arms   Psychiatric: He has a normal mood and affect. His behavior is normal. Judgment and thought content normal.  Nursing note  and vitals reviewed.   ED Course  Procedures (including critical care time) Labs Review Labs Reviewed  CBC WITH DIFFERENTIAL/PLATELET - Abnormal; Notable for the following:    WBC 16.8 (*)    Hemoglobin 17.8 (*)    MCH 34.6 (*)    Neutro Abs 10.4 (*)    Eosinophils Absolute 1.8 (*)    Basophils Absolute 0.2 (*)    All other components within normal limits  COMPREHENSIVE METABOLIC PANEL - Abnormal; Notable for the following:    Sodium 131 (*)    CO2 18 (*)    Glucose, Bld 63 (*)    BUN 40 (*)    Creatinine, Ser 2.26 (*)    AST 299 (*)    ALT  283 (*)    Total Bilirubin 3.9 (*)    GFR calc non Af Amer 30 (*)    GFR calc Af Amer 35 (*)    All other components within normal limits  LIPASE, BLOOD - Abnormal; Notable for the following:    Lipase 16 (*)    All other components within normal limits  BRAIN NATRIURETIC PEPTIDE - Abnormal; Notable for the following:    B Natriuretic Peptide 752.1 (*)    All other components within normal limits  I-STAT TROPOININ, ED    Imaging Review Dg Neck Soft Tissue  05/03/2015  CLINICAL DATA:  Neck pain and swelling. EXAM: NECK SOFT TISSUES - 1+ VIEW COMPARISON:  None. FINDINGS: There is no evidence of retropharyngeal soft tissue swelling or epiglottic enlargement. The cervical airway is unremarkable and no radio-opaque foreign body identified. Cervical spine degenerative changes noted. IMPRESSION: No acute findings. Electronically Signed   By: Earle Gell M.D.   On: 05/03/2015 11:06   Dg Chest 2 View  05/03/2015  CLINICAL DATA:  Pt c/o productive cough, SOB and weakness x 2days. HTN, former smoker (quit in 2005). Hx of MI and pneumonia EXAM: CHEST  2 VIEW COMPARISON:  Chest radiograph of 04/30/2015 FINDINGS: Midline trachea. Normal heart size and mediastinal contours. No pleural effusion or pneumothorax. Clear lungs. IMPRESSION: No acute cardiopulmonary disease. Electronically Signed   By: Abigail Miyamoto M.D.   On: 05/03/2015 11:06   Ct Soft  Tissue Neck Wo Contrast  05/03/2015  CLINICAL DATA:  Throat swelling and productive cough, worsening. Facial swelling. EXAM: CT NECK WITHOUT CONTRAST TECHNIQUE: Multidetector CT imaging of the neck was performed following the standard protocol without intravenous contrast. COMPARISON:  None. FINDINGS: Lack of IV contrast material limits the exam. There is diffuse infiltration of subcutaneous fatty tissues which may be due to edema or less likely cellulitis. The right submandibular gland is asymmetrically enlarged relative to the left with subtle low attenuation within it and mild haziness of its borders. Major salivary glands are otherwise unremarkable. No lymphadenopathy is seen. No abscess is identified. The lung apices are clear. No lytic or sclerotic bony lesion is seen. Cervical spondylosis is noted. IMPRESSION: Asymmetric enlargement of the right submandibular gland relative to the left is nonspecific but could be due to infection, inflammation or possibly tumor. MRI of the neck with contrast if possible after the patient's acute episode has passed is recommended for further evaluation. Negative for abscess. Diffuse subcutaneous edema is nonspecific and likely due to anasarca rather than cellulitis. Electronically Signed   By: Inge Rise M.D.   On: 05/03/2015 13:18   Ct Renal Stone Study  05/03/2015  CLINICAL DATA:  Mid abdominal pain. Elevated liver function tests. Renal failure. EXAM: CT ABDOMEN AND PELVIS WITHOUT CONTRAST TECHNIQUE: Multidetector CT imaging of the abdomen and pelvis was performed following the standard protocol without IV contrast. COMPARISON:  04/23/2015 FINDINGS: Lower chest:  No acute findings. Hepatobiliary: No mass visualized on this un-enhanced exam. Gallbladder is unremarkable. No evidence of biliary ductal dilatation on this unenhanced exam. Pancreas: No mass or inflammatory process identified on this un-enhanced exam. Spleen: Within normal limits in size.  Adrenals/Urinary Tract: No evidence of urolithiasis or hydronephrosis. Mild bilateral perinephric stranding is nonspecific and stable in appearance since previous study. Stomach/Bowel: No evidence of obstruction, inflammatory process, or abnormal fluid collections. Normal appendix visualized. Vascular/Lymphatic: No pathologically enlarged lymph nodes. No evidence of abdominal aortic aneurysm. Reproductive: Stable moderately enlarged prostate Other: None. Musculoskeletal:  No  suspicious bone lesions identified. IMPRESSION: No evidence of urolithiasis, hydronephrosis, or other acute findings. Stable moderately enlarged prostate. Electronically Signed   By: Earle Gell M.D.   On: 05/03/2015 13:13   I have personally reviewed and evaluated these images and lab results as part of my medical decision-making.   EKG Interpretation   Date/Time:  Sunday May 03 2015 10:08:49 EDT Ventricular Rate:  94 PR Interval:  188 QRS Duration: 184 QT Interval:  426 QTC Calculation: 533 R Axis:   -122 Text Interpretation:  Sinus rhythm Right atrial enlargement Nonspecific  intraventricular conduction delay No significant change since last tracing  Confirmed by Cole Eastridge  MD, Lavel Rieman ([id number]) on 05/03/2015 10:10:53 AM      MDM   Final diagnoses:  Renal failure   Verdie Cham is a 59 y.o. male here with neck swelling, rash. Consider [place] vs neck infection. Rash can be allergic vs from rising LFTs.   1:55 PM LFTs increased (AST and ALT in 300s, bili 4 now). CT unremarkable. Worsening renal failure. WBC 16. CT neck showed possible inflammation of the R submandibular gland. Consulted Dr. Constance Holster, who recommend clinda and formal consult if swelling not improved. Will admit.    Wandra Arthurs, MD 05/03/15 1356

## 2015-05-03 NOTE — ED Notes (Signed)
Bed: [id number] Expected date:  Expected time:  Means of arrival:  Comments: Allergic rxn

## 2015-05-03 NOTE — ED Notes (Signed)
MD at bedside. 

## 2015-05-03 NOTE — ED Notes (Signed)
Spoke with Janett Billow, Agricultural consultant. Patient can be transported @ 1435.

## 2015-05-03 NOTE — H&P (Signed)
[facility] History and Physical  Treveion Recio [id number] DOB: 13-Sep-1955 DOA: 05/03/2015  Referring physician: ER physician: Dr. Shirlyn Goltz  PCP: Charolette Forward, MD  Chief Complaint: neck swelling   HPI:  59 year old male with past medical history of hypertension, chronic combined CHF, CAD, NSTEMI (on aspirin and plavix), CKD stage 3 who presented to [facility] ED with reports of neck swelling which has been there for some time but has gotten worse in past few days. No respiratory distress, no difficulty swallowing. No stridor. No chest pain, shortness of breath or palpitations. No fevers or cough. No abdominal pain, nausea or vomiting. No diarrhea or constipation. No blood in stool or urine. No lightheadedness or loss of consciousness.   In ED, pt was hemodynamically stable. BP was 91/61, HR 92. Blood work revealed WBC count of 16.8, creatinine 2.26 9 (baseline 8 months ago 1.9). LFT's were as follows: AST 299, ALT 283, TB 3.9. His recent abd [place] 04/30/15 showed mild gallbladder wall thickening probably attributable to nondistention/contraction. No gallstones  observed. CXR showed no acute findings. X ray of the neck showed no acute findings. CT neck showed asymmetric enlargement of the right submandibular gland relative to the left is nonspecific but could be due to infection, inflammation or possibly tumor. CT renal stone study showed no hydronephrosis or urolithiasis.   Assessment & Plan    Principal Problem:   Neck swelling, cellulitis  /  Leukocytosis / SIRS - Based on CT neck likely anasarca rather than cellulitis. Pt however meets SIRS criteria with tachycardia, leukocytosis so we will treat empirically for cellulitis  - Continue empiric clinda which was started in ED, per cellulitis protocol  - Monitor daily CBC  Active Problems:   Acute renal failure superimposed on stage 3 chronic kidney disease (HCC) - Baseline creatinine about 8 months ago 1.9 - Creatinine on this  admission 2.26, possibly from lasix, lisinopril which was placed on hold - Continue to monitor renal function    Chronic combined systolic (congestive) and diastolic (congestive) heart failure ([place]) - last 2 D ECHO in 12/2014 with EF of 20% - Compensated     Benign essential HTN - Resume only coreg since BP on soft side - Hold imdur and ramipril    History of non-ST elevation myocardial infarction (NSTEMI) - Continue aspirin and plavix - Continue statin therapy - No chest pain    Hyponatremia - Likely CHF etiology - Continue to monitor     Abnormal LFTs - Recent US done 10/13 with no acute findings, no gallstone seen - Trend LFT's for now - May need HIDA scan if LFT's trend up     Polysubstance abuse / Noncompliance / Homelessness - SW consulted  - Check UDS and alcohol level   DVT prophylaxis:  - SCD's bilaterally - Also takes aspirin and plavix   Radiological Exams on Admission: Dg Neck Soft Tissue 05/03/2015   No acute findings.   Dg Chest 2 View 05/03/2015  No acute cardiopulmonary disease.   Ct Soft Tissue Neck Wo Contrast 05/03/2015  Asymmetric enlargement of the right submandibular gland relative to the left is nonspecific but could be due to infection, inflammation or possibly tumor. MRI of the neck with contrast if possible after the patient's acute episode has passed is recommended for further evaluation. Negative for abscess. Diffuse subcutaneous edema is nonspecific and likely due to anasarca rather than cellulitis.   Ct Renal Stone Study 05/03/2015   No evidence of urolithiasis, hydronephrosis, or other  acute findings. Stable moderately enlarged prostate. Electronically Signed   By: Earle Gell M.D.   On: 05/03/2015 13:13    Code Status: Full Family Communication: Family not at the bedside  Disposition Plan: Admit for further evaluation, observation, telemetry   DEVINE, Dedra Skeens, MD  [facility] Pager [phone]  Time spent in minutes: 55  minutes  Review of Systems:  Constitutional: Negative for fever, chills and malaise/fatigue. Negative for diaphoresis.  HENT: Negative for hearing loss, ear pain, nosebleeds, congestion, sore throat, tinnitus and ear discharge.   Eyes: Negative for blurred vision, double vision, photophobia, pain, discharge and redness.  Respiratory: Negative for cough, hemoptysis, sputum production, shortness of breath, wheezing and stridor.   Cardiovascular: Negative for chest pain, palpitations, orthopnea, claudication and leg swelling.  Gastrointestinal: Negative for nausea, vomiting and abdominal pain. Negative for heartburn, constipation, blood in stool and melena.  Genitourinary: Negative for dysuria, urgency, frequency, hematuria and flank pain.  Musculoskeletal: Negative for myalgias, back pain, joint pain and falls.  Skin: Negative for itching and rash.  Neurological: Negative for dizziness and weakness. Negative for tingling, tremors, sensory change, speech change, focal weakness, loss of consciousness and headaches.  Endo/Heme/Allergies: Negative for environmental allergies and polydipsia. Does not bruise/bleed easily.  Psychiatric/Behavioral: Negative for suicidal ideas. The patient is not nervous/anxious.      Past Medical History  Diagnosis Date  . Hypertension   . Homelessness   . Urinary hesitancy   . Hypercholesterolemia   . NSTEMI (non-ST elevated myocardial infarction) ([place]) 01/07/2015  . Heart attack ([place]) 07/2014  . Walking pneumonia 07/2014  . Headache     "probably weekly" (01/07/2015)  . Arthritis     "all over" (01/07/2015)  . Anxiety   . Depression   . Noncompliance   . Polysubstance abuse     etoh, cocaine  . Ischemic dilated cardiomyopathy   . CKD (chronic kidney disease), stage IV ([place])   . History of echocardiogram 12/2014    EF 20-25%   Past Surgical History  Procedure Laterality Date  . Left heart catheterization with coronary angiogram N/A 08/15/2014    Procedure:  LEFT HEART CATHETERIZATION WITH CORONARY ANGIOGRAM;  Surgeon: Clent Demark, MD;  Location: [facility] CATH LAB;  Service: Cardiovascular;  Laterality: N/A;  . Cardiac catheterization     Social History:  reports that he quit smoking about 11 years ago. His smoking use included Cigarettes. He has a 45 pack-year smoking history. He has never used smokeless tobacco. He reports that he drinks alcohol. He reports that he does not use illicit drugs.  No Known Allergies  Family History: htn in family    Prior to Admission medications   Medication Sig Start Date End Date Taking? Authorizing Provider  aspirin EC 81 MG tablet Take 81 mg by mouth daily.   Yes Historical Provider, MD  atorvastatin (LIPITOR) 80 MG tablet Take 80 mg by mouth daily.   Yes Historical Provider, MD  carvedilol (COREG) 3.125 MG tablet Take 1 tablet (3.125 mg total) by mouth 2 (two) times daily with a meal. Discontinue 6.25mg  03/24/15  Yes [name], MD  clopidogrel (PLAVIX) 75 MG tablet Take 1 tablet (75 mg total) by mouth daily. 01/09/15  Yes Charolette Forward, MD  colchicine 0.6 MG tablet Take 1.2 mg by mouth daily as needed (gout). Take 2 tabs (1.2mg ) PO at the onset of a gout attack, may repeat 1 tab(0.6mg ) an hour later, max 1.8mg  03/24/15  Yes [name], MD  diphenhydrAMINE (BENADRYL) 25  MG tablet Take 1 tablet (25 mg total) by mouth every 6 (six) hours. 04/30/15 05/03/15 Yes Gareth Morgan, MD  furosemide (LASIX) 40 MG tablet Take 1.5 tablets (60 mg total) by mouth 2 (two) times daily. 03/31/15  Yes [name], MD  isosorbide mononitrate (IMDUR) 30 MG 24 hr tablet Take 15-30 mg by mouth daily.    Yes Historical Provider, MD  isosorbide-hydrALAZINE (BIDIL) 20-37.5 MG per tablet Take 1 tablet by mouth 2 (two) times daily. 03/10/15  Yes Charolette Forward, MD  mirabegron ER (MYRBETRIQ) 25 MG TB24 tablet Take 25 mg by mouth daily.   Yes Historical Provider, MD  nitroGLYCERIN (NITROSTAT) 0.4 MG SL tablet Place 1 tablet (0.4 mg total) under  the tongue every 5 (five) minutes x 3 doses as needed for chest pain. 08/16/14  Yes Dixie Dials, MD  ramipril (ALTACE) 1.25 MG capsule Take 1.25 mg by mouth daily.   Yes Historical Provider, MD  ranitidine (ZANTAC) 150 MG capsule Take 1 capsule (150 mg total) by mouth 2 (two) times daily. 04/30/15  Yes Gareth Morgan, MD  traMADol (ULTRAM) 50 MG tablet Take 1 tablet (50 mg total) by mouth every 12 (twelve) hours as needed. Patient taking differently: Take 50 mg by mouth every 12 (twelve) hours as needed for moderate pain.  03/31/15  Yes [name], MD   Physical Exam: Filed Vitals:   05/03/15 0951 05/03/15 0957 05/03/15 1213 05/03/15 1414  BP:  104/62 97/63 91/61   Pulse:  100 91 92  Temp: 98.8 F (37.1 C)     TempSrc: Oral     Resp:  18 16 16   Height:  5\' 8"  (1.727 m)    Weight:  68.947 kg (152 lb)    SpO2:  100% 96% 98%    Physical Exam  Constitutional: Appears well-developed and well-nourished. No distress.  HENT: Normocephalic. No tonsillar erythema or exudates Eyes: Conjunctivae are normal. No scleral icterus.  Neck: Normal ROM. Swelling in neck area. No tracheal deviation. No thyromegaly.  CVS: RRR, S1/S2 appreciated  Pulmonary: Effort and breath sounds normal, no stridor, rhonchi, wheezes, rales.  Abdominal: Soft. BS +,  no distension, tenderness, rebound or guarding.  Musculoskeletal: Normal range of motion. LE edema appreciated but no tenderness.  Lymphadenopathy: No lymphadenopathy noted, cervical, inguinal. Neuro: Alert. Normal reflexes, muscle tone coordination. No focal neurologic deficits. Skin: Skin is warm and dry. No rash noted.  No erythema. No pallor.  Psychiatric: Normal mood and affect. Behavior, judgment, thought content normal.   Labs on Admission:  Basic Metabolic Panel:  Recent Labs Lab 04/30/15 1459 05/03/15 1009  NA 134* 131*  K 4.2 4.8  CL 102 101  CO2 22 18*  GLUCOSE 108* 63*  BUN 26* 40*  CREATININE 1.94* 2.26*  CALCIUM 9.2 8.9   Liver  Function Tests:  Recent Labs Lab 04/30/15 1459 05/03/15 1009  AST 169* 299*  ALT 130* 283*  ALKPHOS 116 104  BILITOT 2.0* 3.9*  PROT 7.2 6.9  ALBUMIN 4.0 3.7    Recent Labs Lab 04/30/15 1724 05/03/15 1009  LIPASE 15* 16*   No results for input(s): AMMONIA in the last 168 hours. CBC:  Recent Labs Lab 04/30/15 1459 05/03/15 1009  WBC 7.1 16.8*  NEUTROABS 4.2 10.4*  HGB 14.9 17.8*  HCT 43.4 51.4  MCV 100.2* 99.8  PLT 212 253   Cardiac Enzymes: No results for input(s): CKTOTAL, CKMB, CKMBINDEX, TROPONINI in the last 168 hours. BNP: Invalid input(s): POCBNP CBG: No results for input(s): GLUCAP  in the last 168 hours.  If 7PM-7AM, please contact night-coverage [email or web address] Password [email or web address] 05/03/2015, 2:45 PM

## 2015-05-03 NOTE — ED Notes (Signed)
Pt brought in by EMS. Complaining of facial swelling, coughing up yellow frothy sputum, and hives. No trouble breathing, lung sounds clear. Throat sore 7/10. Throat red upon inspection. EMS gave one dose of Benadryl in route. No new foods. He did mention a new medication from the urologist, but started that a couple of weeks ago.

## 2015-05-04 ENCOUNTER — Inpatient Hospital Stay (HOSPITAL_COMMUNITY): Payer: Medicaid Other

## 2015-05-04 DIAGNOSIS — A4150 Gram-negative sepsis, unspecified: Secondary | ICD-10-CM | POA: Diagnosis not present

## 2015-05-04 DIAGNOSIS — K709 Alcoholic liver disease, unspecified: Secondary | ICD-10-CM | POA: Diagnosis not present

## 2015-05-04 DIAGNOSIS — I5042 Chronic combined systolic (congestive) and diastolic (congestive) heart failure: Secondary | ICD-10-CM | POA: Diagnosis present

## 2015-05-04 DIAGNOSIS — Z789 Other specified health status: Secondary | ICD-10-CM | POA: Diagnosis not present

## 2015-05-04 DIAGNOSIS — M869 Osteomyelitis, unspecified: Secondary | ICD-10-CM | POA: Diagnosis present

## 2015-05-04 DIAGNOSIS — Z9119 Patient's noncompliance with other medical treatment and regimen: Secondary | ICD-10-CM | POA: Diagnosis not present

## 2015-05-04 DIAGNOSIS — Z8249 Family history of ischemic heart disease and other diseases of the circulatory system: Secondary | ICD-10-CM | POA: Diagnosis not present

## 2015-05-04 DIAGNOSIS — E873 Alkalosis: Secondary | ICD-10-CM | POA: Diagnosis not present

## 2015-05-04 DIAGNOSIS — Z87891 Personal history of nicotine dependence: Secondary | ICD-10-CM | POA: Diagnosis not present

## 2015-05-04 DIAGNOSIS — B952 Enterococcus as the cause of diseases classified elsewhere: Secondary | ICD-10-CM | POA: Diagnosis present

## 2015-05-04 DIAGNOSIS — A498 Other bacterial infections of unspecified site: Secondary | ICD-10-CM | POA: Diagnosis not present

## 2015-05-04 DIAGNOSIS — D721 Eosinophilia: Secondary | ICD-10-CM | POA: Diagnosis not present

## 2015-05-04 DIAGNOSIS — N170 Acute kidney failure with tubular necrosis: Secondary | ICD-10-CM | POA: Diagnosis not present

## 2015-05-04 DIAGNOSIS — D72829 Elevated white blood cell count, unspecified: Secondary | ICD-10-CM | POA: Diagnosis not present

## 2015-05-04 DIAGNOSIS — J40 Bronchitis, not specified as acute or chronic: Secondary | ICD-10-CM | POA: Diagnosis present

## 2015-05-04 DIAGNOSIS — A419 Sepsis, unspecified organism: Secondary | ICD-10-CM | POA: Diagnosis present

## 2015-05-04 DIAGNOSIS — L03221 Cellulitis of neck: Secondary | ICD-10-CM | POA: Diagnosis present

## 2015-05-04 DIAGNOSIS — Z79899 Other long term (current) drug therapy: Secondary | ICD-10-CM | POA: Diagnosis not present

## 2015-05-04 DIAGNOSIS — F1910 Other psychoactive substance abuse, uncomplicated: Secondary | ICD-10-CM | POA: Diagnosis not present

## 2015-05-04 DIAGNOSIS — I10 Essential (primary) hypertension: Secondary | ICD-10-CM | POA: Diagnosis not present

## 2015-05-04 DIAGNOSIS — E874 Mixed disorder of acid-base balance: Secondary | ICD-10-CM | POA: Diagnosis present

## 2015-05-04 DIAGNOSIS — N179 Acute kidney failure, unspecified: Secondary | ICD-10-CM | POA: Diagnosis not present

## 2015-05-04 DIAGNOSIS — N184 Chronic kidney disease, stage 4 (severe): Secondary | ICD-10-CM | POA: Diagnosis present

## 2015-05-04 DIAGNOSIS — J80 Acute respiratory distress syndrome: Secondary | ICD-10-CM | POA: Diagnosis not present

## 2015-05-04 DIAGNOSIS — R000 Tachycardia, unspecified: Secondary | ICD-10-CM | POA: Diagnosis not present

## 2015-05-04 DIAGNOSIS — K1120 Sialoadenitis, unspecified: Secondary | ICD-10-CM | POA: Diagnosis not present

## 2015-05-04 DIAGNOSIS — Z992 Dependence on renal dialysis: Secondary | ICD-10-CM | POA: Diagnosis not present

## 2015-05-04 DIAGNOSIS — Z7902 Long term (current) use of antithrombotics/antiplatelets: Secondary | ICD-10-CM | POA: Diagnosis not present

## 2015-05-04 DIAGNOSIS — R0600 Dyspnea, unspecified: Secondary | ICD-10-CM | POA: Diagnosis not present

## 2015-05-04 DIAGNOSIS — R220 Localized swelling, mass and lump, head: Secondary | ICD-10-CM | POA: Diagnosis not present

## 2015-05-04 DIAGNOSIS — N320 Bladder-neck obstruction: Secondary | ICD-10-CM | POA: Diagnosis not present

## 2015-05-04 DIAGNOSIS — I255 Ischemic cardiomyopathy: Secondary | ICD-10-CM | POA: Diagnosis present

## 2015-05-04 DIAGNOSIS — E43 Unspecified severe protein-calorie malnutrition: Secondary | ICD-10-CM | POA: Diagnosis present

## 2015-05-04 DIAGNOSIS — D649 Anemia, unspecified: Secondary | ICD-10-CM | POA: Diagnosis present

## 2015-05-04 DIAGNOSIS — K759 Inflammatory liver disease, unspecified: Secondary | ICD-10-CM | POA: Diagnosis not present

## 2015-05-04 DIAGNOSIS — R221 Localized swelling, mass and lump, neck: Secondary | ICD-10-CM | POA: Diagnosis present

## 2015-05-04 DIAGNOSIS — R7881 Bacteremia: Secondary | ICD-10-CM | POA: Diagnosis not present

## 2015-05-04 DIAGNOSIS — R319 Hematuria, unspecified: Secondary | ICD-10-CM | POA: Diagnosis not present

## 2015-05-04 DIAGNOSIS — T380X5A Adverse effect of glucocorticoids and synthetic analogues, initial encounter: Secondary | ICD-10-CM | POA: Diagnosis not present

## 2015-05-04 DIAGNOSIS — L0390 Cellulitis, unspecified: Secondary | ICD-10-CM | POA: Diagnosis not present

## 2015-05-04 DIAGNOSIS — I2510 Atherosclerotic heart disease of native coronary artery without angina pectoris: Secondary | ICD-10-CM | POA: Diagnosis present

## 2015-05-04 DIAGNOSIS — I340 Nonrheumatic mitral (valve) insufficiency: Secondary | ICD-10-CM | POA: Diagnosis present

## 2015-05-04 DIAGNOSIS — E871 Hypo-osmolality and hyponatremia: Secondary | ICD-10-CM | POA: Diagnosis present

## 2015-05-04 DIAGNOSIS — K7200 Acute and subacute hepatic failure without coma: Secondary | ICD-10-CM | POA: Diagnosis present

## 2015-05-04 DIAGNOSIS — J9601 Acute respiratory failure with hypoxia: Secondary | ICD-10-CM | POA: Diagnosis not present

## 2015-05-04 DIAGNOSIS — T360X5A Adverse effect of penicillins, initial encounter: Secondary | ICD-10-CM | POA: Diagnosis not present

## 2015-05-04 DIAGNOSIS — R21 Rash and other nonspecific skin eruption: Secondary | ICD-10-CM | POA: Diagnosis not present

## 2015-05-04 DIAGNOSIS — G9340 Encephalopathy, unspecified: Secondary | ICD-10-CM | POA: Diagnosis not present

## 2015-05-04 DIAGNOSIS — A4159 Other Gram-negative sepsis: Secondary | ICD-10-CM | POA: Diagnosis present

## 2015-05-04 DIAGNOSIS — I252 Old myocardial infarction: Secondary | ICD-10-CM | POA: Diagnosis not present

## 2015-05-04 DIAGNOSIS — A4181 Sepsis due to Enterococcus: Secondary | ICD-10-CM | POA: Diagnosis not present

## 2015-05-04 DIAGNOSIS — E785 Hyperlipidemia, unspecified: Secondary | ICD-10-CM | POA: Diagnosis present

## 2015-05-04 DIAGNOSIS — R1310 Dysphagia, unspecified: Secondary | ICD-10-CM | POA: Diagnosis present

## 2015-05-04 DIAGNOSIS — Z590 Homelessness: Secondary | ICD-10-CM | POA: Diagnosis not present

## 2015-05-04 DIAGNOSIS — L89152 Pressure ulcer of sacral region, stage 2: Secondary | ICD-10-CM | POA: Diagnosis present

## 2015-05-04 DIAGNOSIS — I129 Hypertensive chronic kidney disease with stage 1 through stage 4 chronic kidney disease, or unspecified chronic kidney disease: Secondary | ICD-10-CM | POA: Diagnosis present

## 2015-05-04 DIAGNOSIS — M109 Gout, unspecified: Secondary | ICD-10-CM | POA: Diagnosis present

## 2015-05-04 DIAGNOSIS — Z7982 Long term (current) use of aspirin: Secondary | ICD-10-CM | POA: Diagnosis not present

## 2015-05-04 DIAGNOSIS — J9811 Atelectasis: Secondary | ICD-10-CM | POA: Diagnosis not present

## 2015-05-04 DIAGNOSIS — L509 Urticaria, unspecified: Secondary | ICD-10-CM | POA: Diagnosis present

## 2015-05-04 DIAGNOSIS — E875 Hyperkalemia: Secondary | ICD-10-CM | POA: Diagnosis present

## 2015-05-04 DIAGNOSIS — N183 Chronic kidney disease, stage 3 (moderate): Secondary | ICD-10-CM | POA: Diagnosis not present

## 2015-05-04 DIAGNOSIS — R740 Nonspecific elevation of levels of transaminase and lactic acid dehydrogenase [LDH]: Secondary | ICD-10-CM | POA: Diagnosis not present

## 2015-05-04 DIAGNOSIS — D759 Disease of blood and blood-forming organs, unspecified: Secondary | ICD-10-CM | POA: Diagnosis not present

## 2015-05-04 DIAGNOSIS — Z6824 Body mass index (BMI) 24.0-24.9, adult: Secondary | ICD-10-CM | POA: Diagnosis not present

## 2015-05-04 DIAGNOSIS — R6521 Severe sepsis with septic shock: Secondary | ICD-10-CM | POA: Diagnosis not present

## 2015-05-04 DIAGNOSIS — R509 Fever, unspecified: Secondary | ICD-10-CM | POA: Diagnosis not present

## 2015-05-04 DIAGNOSIS — R6520 Severe sepsis without septic shock: Secondary | ICD-10-CM | POA: Diagnosis not present

## 2015-05-04 DIAGNOSIS — B9620 Unspecified Escherichia coli [E. coli] as the cause of diseases classified elsewhere: Secondary | ICD-10-CM | POA: Diagnosis not present

## 2015-05-04 DIAGNOSIS — E162 Hypoglycemia, unspecified: Secondary | ICD-10-CM | POA: Diagnosis not present

## 2015-05-04 DIAGNOSIS — M1990 Unspecified osteoarthritis, unspecified site: Secondary | ICD-10-CM | POA: Diagnosis present

## 2015-05-04 DIAGNOSIS — J9600 Acute respiratory failure, unspecified whether with hypoxia or hypercapnia: Secondary | ICD-10-CM | POA: Diagnosis not present

## 2015-05-04 DIAGNOSIS — D696 Thrombocytopenia, unspecified: Secondary | ICD-10-CM | POA: Diagnosis not present

## 2015-05-04 DIAGNOSIS — R7989 Other specified abnormal findings of blood chemistry: Secondary | ICD-10-CM | POA: Diagnosis not present

## 2015-05-04 LAB — GLUCOSE, CAPILLARY: Glucose-Capillary: 112 mg/dL — ABNORMAL HIGH (ref 65–99)

## 2015-05-04 LAB — URINALYSIS, ROUTINE W REFLEX MICROSCOPIC
Glucose, UA: NEGATIVE mg/dL
Ketones, ur: NEGATIVE mg/dL
Nitrite: NEGATIVE
Protein, ur: 100 mg/dL — AB
Specific Gravity, Urine: 1.024 (ref 1.005–1.030)
Urobilinogen, UA: 4 mg/dL — ABNORMAL HIGH (ref 0.0–1.0)
pH: 5.5 (ref 5.0–8.0)

## 2015-05-04 LAB — URINE MICROSCOPIC-ADD ON

## 2015-05-04 LAB — CBC
HEMATOCRIT: 39.3 % (ref 39.0–52.0)
HEMOGLOBIN: 13.4 g/dL (ref 13.0–17.0)
MCH: 34 pg (ref 26.0–34.0)
MCHC: 34.1 g/dL (ref 30.0–36.0)
MCV: 99.7 fL (ref 78.0–100.0)
Platelets: 225 10*3/uL (ref 150–400)
RBC: 3.94 MIL/uL — ABNORMAL LOW (ref 4.22–5.81)
RDW: 13.4 % (ref 11.5–15.5)
WBC: 16.2 10*3/uL — ABNORMAL HIGH (ref 4.0–10.5)

## 2015-05-04 LAB — COMPREHENSIVE METABOLIC PANEL
ALBUMIN: 2.8 g/dL — AB (ref 3.5–5.0)
ALT: 218 U/L — ABNORMAL HIGH (ref 17–63)
ANION GAP: 7 (ref 5–15)
AST: 195 U/L — ABNORMAL HIGH (ref 15–41)
Alkaline Phosphatase: 93 U/L (ref 38–126)
BUN: 50 mg/dL — ABNORMAL HIGH (ref 6–20)
CO2: 20 mmol/L — AB (ref 22–32)
Calcium: 7.9 mg/dL — ABNORMAL LOW (ref 8.9–10.3)
Chloride: 104 mmol/L (ref 101–111)
Creatinine, Ser: 2.36 mg/dL — ABNORMAL HIGH (ref 0.61–1.24)
GFR calc Af Amer: 33 mL/min — ABNORMAL LOW (ref >60)
GFR calc non Af Amer: 28 mL/min — ABNORMAL LOW (ref >60)
GLUCOSE: 136 mg/dL — AB (ref 65–99)
POTASSIUM: 4.9 mmol/L (ref 3.5–5.1)
SODIUM: 131 mmol/L — AB (ref 135–145)
Total Bilirubin: 2.7 mg/dL — ABNORMAL HIGH (ref 0.3–1.2)
Total Protein: 5 g/dL — ABNORMAL LOW (ref 6.5–8.1)

## 2015-05-04 LAB — LACTIC ACID, PLASMA
LACTIC ACID, VENOUS: 3.6 mmol/L — AB (ref 0.5–2.0)
Lactic Acid, Venous: 2.7 mmol/L (ref 0.5–2.0)
Lactic Acid, Venous: 2.9 mmol/L (ref 0.5–2.0)
Lactic Acid, Venous: 3.6 mmol/L (ref 0.5–2.0)

## 2015-05-04 LAB — PROCALCITONIN: PROCALCITONIN: 3.23 ng/mL

## 2015-05-04 MED ORDER — SODIUM CHLORIDE 0.9 % IV SOLN
INTRAVENOUS | Status: DC
  Administered 2015-05-04: 06:00:00 via INTRAVENOUS

## 2015-05-04 MED ORDER — SODIUM CHLORIDE 0.9 % IV SOLN
INTRAVENOUS | Status: DC
  Administered 2015-05-04 – 2015-05-05 (×2): via INTRAVENOUS

## 2015-05-04 MED ORDER — PIPERACILLIN-TAZOBACTAM 3.375 G IVPB
3.3750 g | Freq: Three times a day (TID) | INTRAVENOUS | Status: DC
  Administered 2015-05-04 – 2015-05-08 (×13): 3.375 g via INTRAVENOUS
  Filled 2015-05-04 (×11): qty 50

## 2015-05-04 MED ORDER — GADOBENATE DIMEGLUMINE 529 MG/ML IV SOLN
10.0000 mL | Freq: Once | INTRAVENOUS | Status: AC | PRN
  Administered 2015-05-04: 7 mL via INTRAVENOUS

## 2015-05-04 MED ORDER — VANCOMYCIN HCL IN DEXTROSE 750-5 MG/150ML-% IV SOLN
750.0000 mg | INTRAVENOUS | Status: DC
  Administered 2015-05-04 – 2015-05-06 (×3): 750 mg via INTRAVENOUS
  Filled 2015-05-04 (×4): qty 150

## 2015-05-04 MED ORDER — SODIUM CHLORIDE 0.9 % IV BOLUS (SEPSIS)
1000.0000 mL | Freq: Once | INTRAVENOUS | Status: AC
  Administered 2015-05-04: 1000 mL via INTRAVENOUS

## 2015-05-04 NOTE — Progress Notes (Signed)
CRITICAL VALUE ALERT  Critical value received:  LActic Acid- 3.6  Date of notification:  05/04/15  Time of notification:  1222  Critical value read back:Yes.    Nurse who received alert:  Sheffield Slider   MD notified (1st page):  Dr. Charlies Silvers  Time of first page:  53  MD notified (2nd page):  Time of second page:  Responding MD:  None  Time MD responded:

## 2015-05-04 NOTE — Progress Notes (Signed)
ANTIBIOTIC CONSULT NOTE - INITIAL  Pharmacy Consult for Vancomycin  Indication: Cellulitis  No Known Allergies  Patient Measurements: Height: 5\' 8"  (172.7 cm) Weight: 160 lb 15 oz (73 kg) IBW/kg (Calculated) : 68.4 Adjusted Body Weight:   Vital Signs: Temp: 98.6 F (37 C) (10/17 0529) Temp Source: Oral (10/17 0529) BP: 88/52 mmHg (10/17 0529) Pulse Rate: 91 (10/17 0529) Intake/Output from previous day: 10/16 0701 - 10/17 0700 In: 410 [P.O.:360; IV Piggyback:50] Out: -  Intake/Output from this shift: Total I/O In: 410 [P.O.:360; IV Piggyback:50] Out: -   Labs:  Recent Labs  05/03/15 1009 05/03/15 1542 05/04/15 0255  WBC 16.8* 17.4* 16.2*  HGB 17.8* 14.4 13.4  PLT 253 230 225  CREATININE 2.26* 2.31* 2.36*   Estimated Creatinine Clearance: 32.2 mL/min (by C-G formula based on Cr of 2.36). No results for input(s): VANCOTROUGH, VANCOPEAK, VANCORANDOM, GENTTROUGH, GENTPEAK, GENTRANDOM, TOBRATROUGH, TOBRAPEAK, TOBRARND, AMIKACINPEAK, AMIKACINTROU, AMIKACIN in the last 72 hours.   Microbiology: No results found for this or any previous visit (from the past 720 hour(s)).  Medical History: Past Medical History  Diagnosis Date  . Hypertension   . Homelessness   . Urinary hesitancy   . Hypercholesterolemia   . NSTEMI (non-ST elevated myocardial infarction) ([place]) 01/07/2015  . Heart attack ([place]) 07/2014  . Walking pneumonia 07/2014  . Headache     "probably weekly" (01/07/2015)  . Arthritis     "all over" (01/07/2015)  . Anxiety   . Depression   . Noncompliance   . Polysubstance abuse     etoh, cocaine  . Ischemic dilated cardiomyopathy   . CKD (chronic kidney disease), stage IV ([place])   . History of echocardiogram 12/2014    EF 20-25%    Medications:  Anti-infectives    Start     Dose/Rate Route Frequency Ordered Stop   05/04/15 0600  vancomycin (VANCOCIN) IVPB 750 mg/150 ml premix     750 mg 150 mL/hr over 60 Minutes Intravenous Every 24 hours 05/04/15 0557      05/03/15 2200  clindamycin (CLEOCIN) IVPB 600 mg  Status:  Discontinued     600 mg 100 mL/hr over 30 Minutes Intravenous 3 times per day 05/03/15 1524 05/04/15 0225   05/03/15 1330  clindamycin (CLEOCIN) IVPB 600 mg     600 mg 100 mL/hr over 30 Minutes Intravenous  Once 05/03/15 1326 05/03/15 1457     Assessment: Patient with cellulitis was being treated with clindamycin but MD change to vancomycin per pharmacy.  Goal of Therapy:  Vancomycin trough level 10-15 mcg/ml  Plan:  Measure antibiotic drug levels at steady state Follow up culture results Vancomycin 750mg  iv q24hr  Tyler Deis, Shea Stakes [place] 05/04/2015,5:58 AM

## 2015-05-04 NOTE — Progress Notes (Addendum)
BP 87/58 after 1 liter NS bolus. HR 115-120, temp 99.6. Patient also stated he had small amount of blood in stool. NP on call notified. New orders placed. Will continue to monitor closely.

## 2015-05-04 NOTE — Progress Notes (Signed)
CRITICAL VALUE ALERT  Critical value received:  Lactic Acid2.9   Date of notification:  05/03/17   Time of notification:  0719  Critical value read back:Yes.    Nurse who received alert:  C. Addison  MD notified (1st page):  Dr. Charlies Silvers  Time of first page:  0725  MD notified (2nd page):  Time of second page:  Responding MD:  Dr. Charlies Silvers  Time MD responded:  0800

## 2015-05-04 NOTE — Progress Notes (Signed)
Patient ID: [name], male   DOB: June 20, 1956, 59 y.o.   MRN: [id number] [facility] PROGRESS NOTE  Rube Sanchez FWY:[id number] DOB: 03-19-1956 DOA: 05/03/2015 PCP: Charolette Forward, MD  Brief narrative:    59 year old male with past medical history of hypertension, chronic combined CHF, CAD, NSTEMI (on aspirin and plavix), CKD stage 3 who presented to [facility] ED with reports of neck swelling which has been there for some time but has gotten worse in past few days. No respiratory distress, no difficulty swallowing. No stridor.    In ED, pt was hemodynamically stable. BP was 91/61, HR 92. Blood work revealed WBC count of 16.8, creatinine 2.26 (baseline 8 months ago 1.9). CT renal stone study showed no hydronephrosis or urolithiasis.  LFT's were as follows: AST 299, ALT 283, TB 3.9. His recent abd [place] 04/30/15 showed mild gallbladder wall thickening probably attributable to nondistention/contraction. No gallstones..  Other imaging studies included, CXR which showed no acute findings. X ray of the neck showed no acute findings. CT neck showed asymmetric enlargement of the right submandibular gland relative to the left, nonspecific but could be due to infection, inflammation or possibly tumor.   Anticipated discharge: home once neck swelling improves. Likely by 10/19.  Assessment/Plan:    Principal Problem: Sepsis due to neck cellulitis ([place]) /   Neck swelling / Leukocytosis - Sepsis criteria met 10/17 with hypotension, fever, tachycardia, leukocytosis and lactic acidosis so sepsis work up initiated. Source of infection is likely neck cellulitis. Pt was given clinda on admission but this was changed to broad spectrum abx, vanco and zosyn for sepsis. - CT neck on admission demonstrated asymmetric enlargement of the right submandibular gland relative to the left, nonspecific but could be due to infection, inflammation or possibly tumor. Order placed for MRI of the neck for further evaluation. -  Follow up blood culture results. Follow up procalcitonin level.  - Follow up CBC tomorrow am  Active Problems:  Acute renal failure superimposed on stage 3 chronic kidney disease (HCC) - Baseline creatinine about 8 months ago 1.9 - Creatinine on this admission 2.26, possibly from lasix and lisinopril and both of those meds placed on hold. - CT renal stone protocol showed no hydronephrosis or urolithiasis.  - Creatinine is 2.36 this am - We will give NS at 30 cc/hr considering pt has recent 2 D ECHO with EF of 20-25%; please reassess in next 24 hours if fluids still needed - Check BMP tomorrow am    Chronic combined systolic (congestive) and diastolic (congestive) heart failure (HCC) - Last 2 D ECHO in 12/2014 with EF of 20% - Compensated    Benign essential HTN - BP 94/53 so BP meds on hold: coreg, ramipril, imdur, lasix)   History of non-ST elevation myocardial infarction (NSTEMI) - Stable, no reports of chest pain  - Sinus rhythm on 12 lead EKG - Continue aspirin and plavix - Continue statin therapy   Hyponatremia - Likely due to CHF etiology - Sodium stable at 131   Abnormal LFTs - Recent US done 10/13 showed no acute findings, no gallstone seen - AST 299, ALT 283, TB 3.9 on 10/16 and this am AST 195, ALT 218 and TB 2.7 - Will continue to trend LFT's for now.  - May need HIDA scan if no significant improvement in LFT's   Polysubstance abuse / Noncompliance / Homelessness - SW consulted  - UDS and alcohol level WNL  DVT Prophylaxis  - SCD's bilaterally, aspirin, plavix  Code Status: Full.  Family Communication:  plan of care discussed with the patient Disposition Plan: Home once neck swelling improves. Likely by 10/19.  IV access:  Peripheral IV  Procedures and diagnostic studies:    Dg Neck Soft Tissue 05/03/2015  No acute findings. Electronically Signed   By: Earle Gell M.D.   On: 05/03/2015 11:06   Dg Chest 2 View 05/03/2015  No acute  cardiopulmonary disease. Electronically Signed   By: Abigail Miyamoto M.D.   On: 05/03/2015 11:06   Ct Soft Tissue Neck Wo Contrast 05/03/2015   Asymmetric enlargement of the right submandibular gland relative to the left is nonspecific but could be due to infection, inflammation or possibly tumor. MRI of the neck with contrast if possible after the patient's acute episode has passed is recommended for further evaluation. Negative for abscess. Diffuse subcutaneous edema is nonspecific and likely due to anasarca rather than cellulitis. Electronically Signed   By: Inge Rise M.D.   On: 05/03/2015 13:18   Ct Renal Stone Study 05/03/2015 No evidence of urolithiasis, hydronephrosis, or other acute findings. Stable moderately enlarged prostate. Electronically Signed   By: Earle Gell M.D.   On: 05/03/2015 13:13   US Abdomen Limited Ruq 04/30/2015  1. The mild gallbladder wall thickening is probably attributable 2 nondistention/contraction. No gallstones are observed. Sonographic Murphy's sign is technically indeterminate due to pain medication. Electronically Signed   By: Van Clines M.D.   On: 04/30/2015 18:57    Medical Consultants:  None   Other Consultants:  Physical therapy Nutrition SLP  IAnti-Infectives:   Vanco 05/04/2015 -->  Zosyn 05/04/2015 --> Clindamycin 10/16 --> 10/17    Leisa Lenz, MD  [facility] Pager [phone]  Time spent in minutes: 25 minutes  If 7PM-7AM, please contact night-coverage [email or web address] Password [email or web address] 05/04/2015, 10:50 AM   LOS: 0 days    HPI/Subjective: No acute overnight events. Patient reports now feeling more swelling in neck area, no trouble swallowing and no breathing difficulty.   Objective: Filed Vitals:   05/04/15 0156 05/04/15 0529 05/04/15 0700 05/04/15 0825  BP: '87/58 88/52 98/55 ' 94/53  Pulse: 109 91  86  Temp: 99.6 F (37.6 C) 98.6 F (37 C)    TempSrc: Oral Oral    Resp: 16 16    Height:      Weight:  73 kg  (160 lb 15 oz)    SpO2: 100% 96%      Intake/Output Summary (Last 24 hours) at 05/04/15 1050 Last data filed at 05/04/15 0900  Gross per 24 hour  Intake    920 ml  Output      0 ml  Net    920 ml    Exam:   General:  Pt is alert, not in acute distress, still with neck swelling  Cardiovascular: Regular rate and rhythm, S1/S2, no murmurs  Respiratory: Clear to auscultation bilaterally, no wheezing, no crackles, no rhonchi  Abdomen: Soft, non tender, non distended, bowel sounds present  Extremities: No edema, pulses DP and PT palpable bilaterally  Neuro: Grossly nonfocal  Data Reviewed: Basic Metabolic Panel:  Recent Labs Lab 04/30/15 1459 05/03/15 1009 05/03/15 1542 05/04/15 0255  NA 134* 131* 131* 131*  K 4.2 4.8 4.4 4.9  CL 102 101 103 104  CO2 22 18* 20* 20*  GLUCOSE 108* 63* 98 136*  BUN 26* 40* 45* 50*  CREATININE 1.94* 2.26* 2.31* 2.36*  CALCIUM 9.2 8.9 8.0* 7.9*  MG  --   --  1.9  --   PHOS  --   --  3.8  --    Liver Function Tests:  Recent Labs Lab 04/30/15 1459 05/03/15 1009 05/03/15 1542 05/04/15 0255  AST 169* 299* 226* 195*  ALT 130* 283* 229* 218*  ALKPHOS 116 104 78 93  BILITOT 2.0* 3.9* 3.0* 2.7*  PROT 7.2 6.9 5.5* 5.0*  ALBUMIN 4.0 3.7 3.0* 2.8*    Recent Labs Lab 04/30/15 1724 05/03/15 1009  LIPASE 15* 16*   No results for input(s): AMMONIA in the last 168 hours. CBC:  Recent Labs Lab 04/30/15 1459 05/03/15 1009 05/03/15 1542 05/04/15 0255  WBC 7.1 16.8* 17.4* 16.2*  NEUTROABS 4.2 10.4* 11.4*  --   HGB 14.9 17.8* 14.4 13.4  HCT 43.4 51.4 41.7 39.3  MCV 100.2* 99.8 100.2* 99.7  PLT 212 253 230 225   Cardiac Enzymes: No results for input(s): CKTOTAL, CKMB, CKMBINDEX, TROPONINI in the last 168 hours. BNP: Invalid input(s): POCBNP CBG:  Recent Labs Lab 05/04/15 0719  GLUCAP 112*    No results found for this or any previous visit (from the past 240 hour(s)).   Scheduled Meds: . aspirin EC  81 mg Oral Daily   . atorvastatin  80 mg Oral Daily  . carvedilol  3.125 mg Oral BID WC  . clopidogrel  75 mg Oral Daily  . diphenhydrAMINE  25 mg Oral Q6H  . famotidine  20 mg Oral Daily  . mirabegron ER  25 mg Oral Daily  . piperacillin-tazobactam (ZOSYN)  IV  3.375 g Intravenous 3 times per day  . sodium chloride  3 mL Intravenous Q12H  . vancomycin  750 mg Intravenous Q24H   Continuous Infusions: . sodium chloride 50 mL/hr at 05/04/15 [phone]

## 2015-05-04 NOTE — Progress Notes (Signed)
Patient's BP 94/53 coreg due to be given, Dr. Charlies Silvers notified and stated not to give it

## 2015-05-04 NOTE — Progress Notes (Signed)
CRITICAL VALUE ALERT  Critical value received:  Lactic acid 2.7  Date of notification:  05/04/15  Time of notification:  0349  Critical value read back:Yes.    Nurse who received alert:  Virgina Norfolk   MD notified (1st page):  [place]  Time of first page: 0411  MD notified (2nd page):  Time of second page:  Responding MD:  [place]  Time MD responded:  [phone]

## 2015-05-04 NOTE — Clinical Documentation Improvement (Signed)
Internal Medicine  Can the diagnosis of hypotension be further specified? Please update your documentation within the medical record to reflect your response to this query. Thank you   Other  Clinically Undetermined  Supporting Information:  Sepsis POA documented on problem list  Serial lactates elevated: 2.7, 2.9 and 3.6  BP's running 81/53, 88/52, 87/58; no MAPS are available  Please exercise your independent, professional judgment when responding. A specific answer is not anticipated or expected.  Thank You, Zoila Shutter BSN, [place] [phone]

## 2015-05-04 NOTE — Progress Notes (Signed)
BP 81/51. Patient stated he was experiencing dizziness when standing prior to admission. NP on call notified. New orders placed. Will continue to monitor closely.

## 2015-05-04 NOTE — Progress Notes (Signed)
ANTIBIOTIC CONSULT NOTE - INITIAL  Pharmacy Consult for Vancomycin and zosyn Indication: Cellulitis  No Known Allergies  Patient Measurements: Height: 5\' 8"  (172.7 cm) Weight: 160 lb 15 oz (73 kg) IBW/kg (Calculated) : 68.4 Adjusted Body Weight:   Vital Signs: Temp: 98.6 F (37 C) (10/17 0529) Temp Source: Oral (10/17 0529) BP: 94/53 mmHg (10/17 0825) Pulse Rate: 86 (10/17 0825) Intake/Output from previous day: 10/16 0701 - 10/17 0700 In: 680 [P.O.:480; IV Piggyback:200] Out: -  Intake/Output from this shift:    Labs:  Recent Labs  05/03/15 1009 05/03/15 1542 05/04/15 0255  WBC 16.8* 17.4* 16.2*  HGB 17.8* 14.4 13.4  PLT 253 230 225  CREATININE 2.26* 2.31* 2.36*   Estimated Creatinine Clearance: 32.2 mL/min (by C-G formula based on Cr of 2.36). No results for input(s): VANCOTROUGH, VANCOPEAK, VANCORANDOM, GENTTROUGH, GENTPEAK, GENTRANDOM, TOBRATROUGH, TOBRAPEAK, TOBRARND, AMIKACINPEAK, AMIKACINTROU, AMIKACIN in the last 72 hours.   Microbiology: No results found for this or any previous visit (from the past 720 hour(s)).  Medical History: Past Medical History  Diagnosis Date  . Hypertension   . Homelessness   . Urinary hesitancy   . Hypercholesterolemia   . NSTEMI (non-ST elevated myocardial infarction) ([place]) 01/07/2015  . Heart attack ([place]) 07/2014  . Walking pneumonia 07/2014  . Headache     "probably weekly" (01/07/2015)  . Arthritis     "all over" (01/07/2015)  . Anxiety   . Depression   . Noncompliance   . Polysubstance abuse     etoh, cocaine  . Ischemic dilated cardiomyopathy   . CKD (chronic kidney disease), stage IV ([place])   . History of echocardiogram 12/2014    EF 20-25%    Medications:  Anti-infectives    Start     Dose/Rate Route Frequency Ordered Stop   05/04/15 0600  vancomycin (VANCOCIN) IVPB 750 mg/150 ml premix     750 mg 150 mL/hr over 60 Minutes Intravenous Every 24 hours 05/04/15 0557     05/03/15 2200  clindamycin (CLEOCIN)  IVPB 600 mg  Status:  Discontinued     600 mg 100 mL/hr over 30 Minutes Intravenous 3 times per day 05/03/15 1524 05/04/15 0225   05/03/15 1330  clindamycin (CLEOCIN) IVPB 600 mg     600 mg 100 mL/hr over 30 Minutes Intravenous  Once 05/03/15 1326 05/03/15 1457     Assessment: Patient with cellulitis was being treated with clindamycin but MD change to vancomycin per pharmacy.  Zosyn added later on 10/17 for increased lactic acid with  hypotension and concerns for sepsis.   10/17 >> pip/tazo >> 10/17 >> vanco >>  Renal: Scr is increased and treding up WBC elevated  Goal of Therapy:  Vancomycin trough level 10-15 mcg/ml  Plan:   Continue vancomyin as previously ordered, 750mg  q24h  Start zosyn 3.375gm IV q8h over 4h infusion   Doreene Eland, PharmD, BCPS.   Pager: [phone] 05/04/2015,10:00 AM

## 2015-05-04 NOTE — Evaluation (Signed)
Clinical/Bedside Swallow Evaluation Patient Details  Name: [name] MRN: [id number] Date of Birth: 02-10-1956  Today's Date: 05/04/2015 Time: SLP Start Time (ACUTE ONLY): 1140 SLP Stop Time (ACUTE ONLY): 1215 SLP Time Calculation (min) (ACUTE ONLY): 35 min  Past Medical History:  Past Medical History  Diagnosis Date  . Hypertension   . Homelessness   . Urinary hesitancy   . Hypercholesterolemia   . NSTEMI (non-ST elevated myocardial infarction) ([place]) 01/07/2015  . Heart attack ([place]) 07/2014  . Walking pneumonia 07/2014  . Headache     "probably weekly" (01/07/2015)  . Arthritis     "all over" (01/07/2015)  . Anxiety   . Depression   . Noncompliance   . Polysubstance abuse     etoh, cocaine  . Ischemic dilated cardiomyopathy   . CKD (chronic kidney disease), stage IV ([place])   . History of echocardiogram 12/2014    EF 20-25%   Past Surgical History:  Past Surgical History  Procedure Laterality Date  . Left heart catheterization with coronary angiogram N/A 08/15/2014    Procedure: LEFT HEART CATHETERIZATION WITH CORONARY ANGIOGRAM;  Surgeon: Clent Demark, MD;  Location: [facility] CATH LAB;  Service: Cardiovascular;  Laterality: N/A;  . Cardiac catheterization     HPI:  59 yo male adm to [facility] with neck edema.  Pt with h/o HAs, pna, NSTEMI, leukocytosis.  CT showed right submandibular gland asymmetrical enlargement.  Plan is for MRI of neck today.  CXR upon admit was negative.  Pt had difficulties with throat swelling in September as well as pt had xray completed at that time.  Pt reports difficulty swallowing foods and frequent expectoration of secretions.  He also pointed to midesophagus to indicate area of slow clearance.  Per doc flowsheets and pt report, he is consuming 100% of intake.     Assessment / Plan / Recommendation Clinical Impression  Pt presents with report of odynophagia and inconsistent symptoms of dysphagia.  He reports sensation to decreased clearance of foods at  times pointing to mid-sternum as well as feeling a "knot" in said region.  No overt indication of airway compromise or severe dysphagia noted with po observed, however edema and mass may impact pharyngeal clearance.    After intake of 4 ounces applesauce, 3 graham crackers and 2 ounces juice, pt with coughing and expectoration of viscous yellow tinged secretions.  He denies sensation of food/liquid infiltrating airway.  Advised pt to aspiration precautions to mitigate his risk and provided information in writing.    Fortunately pt has maintained his weight and has not had pneumonia therefore he appears to be tolerating dysphagia that has been intermittently present x one month.    SLP will follow up next date to assure pt managing well and to determine indication for instrumental swallow evaluation.  Pt assured SLP that he would monitor how well he does today with his intake.   Recommend he order softer foods to decrease discomfort with intake.  Note MRI of neck pending today.     Aspiration Risk  Mild    Diet Recommendation Dysphagia 3 (Mech soft);Thin   Medication Administration: Whole meds with liquid Compensations: Slow rate;Small sips/bites (rest if sense residuals with poor clearance)    Other  Recommendations Oral Care Recommendations: Oral care QID   Follow Up Recommendations     tbd  Frequency and Duration        Pertinent Vitals/Pain Low grade temp, decreased      Swallow Study Prior Functional Status  see hhx    General Date of Onset: 05/04/15 Other Pertinent Information: 59 yo male adm to [facility] with neck edema.  Pt with h/o HAs, pna, NSTEMI, leukocytosis.  CT showed right submandibular gland asymmetrical enlargement.  Plan is for MRI of neck today.  CXR upon admit was negative.  Pt had difficulties with throat swelling in September as well as pt had xray completed at that time.  Pt reports difficulty swallowing foods and frequent expectoration of secretions.  He also  pointed to midesophagus to indicate area of slow clearance.  Per doc flowsheets and pt report, he is consuming 100% of intake.   Type of Study: Bedside swallow evaluation Diet Prior to this Study: Regular;Thin liquids Temperature Spikes Noted: No Respiratory Status: Room air History of Recent Intubation: No Behavior/Cognition: Alert;Cooperative;Pleasant mood Oral Cavity - Dentition: Adequate natural dentition/normal for age Self-Feeding Abilities: Able to feed self Patient Positioning: Upright in bed Baseline Vocal Quality: Normal Volitional Cough: Strong Volitional Swallow: Able to elicit    Oral/Motor/Sensory Function Overall Oral Motor/Sensory Function: Appears within functional limits for tasks assessed Velum:  (sluggish palatal elevation)   Ice Chips Ice chips: Not tested   Thin Liquid Thin Liquid: Impaired Presentation: Cup;Self Fed Other Comments: pt report globus, clears with further swallows     Nectar Thick Nectar Thick Liquid: Not tested   Honey Thick Honey Thick Liquid: Not tested   Puree Puree: Within functional limits Presentation: Self Fed;Spoon   Solid   GO    Solid: Impaired Pharyngeal Phase Impairments: Multiple swallows       Claudie Fisherman, [facility] SLP [phone]

## 2015-05-04 NOTE — Progress Notes (Signed)
CRITICAL VALUE ALERT  Critical value received: Lactic Acid- 3.6  Date of notification:  05/04/15  Time of notification:  [id number]  Critical value read back:Yes.    Nurse who received alert:  Sheffield Slider  MD notified (1st page):  Dr. Charlies Silvers   Time of first page:  1445  MD notified (2nd page):  Time of second page:  Responding MD:  Dr. Charlies Silvers  Time MD responded:  [phone]

## 2015-05-04 NOTE — Progress Notes (Signed)
Patient c/o blood in urine. NP on call notified. Order for urine culture and UA placed. Specimen collected and sent to lab

## 2015-05-04 NOTE — Progress Notes (Signed)
PT Cancellation Note  Patient Details Name: [name] MRN: [id number] DOB: 21-Jul-1955   Cancelled Treatment:    Reason Eval/Treat Not Completed: PT screened, no needs identified, will sign off Pt declines.  He reports no needs.  Pt not willing to mobilize stating he is here to rest. Since pt declines, PT TO SIGN OFF.   Dorman Calderwood,KATHrine E 05/04/2015, 10:24 AM Carmelia Bake, PT, DPT 05/04/2015 Pager: [phone]

## 2015-05-04 NOTE — Progress Notes (Signed)
Pharmacist Heart Failure Core Measure Documentation  Assessment: [name] has an EF documented as 20-25% on 12/29/2014 by ECHO.  Rationale: Heart failure patients with left ventricular systolic dysfunction (LVSD) and an EF < 40% should be prescribed an angiotensin converting enzyme inhibitor (ACEI) or angiotensin receptor blocker (ARB) at discharge unless a contraindication is documented in the medical record.  This patient is not currently on an ACEI or ARB for HF.  This note is being placed in the record in order to provide documentation that a contraindication to the use of these agents is present for this encounter.  ACE Inhibitor or Angiotensin Receptor Blocker is contraindicated (specify all that apply)  []   ACEI allergy AND ARB allergy []   Angioedema []   Moderate or severe aortic stenosis []   Hyperkalemia []   Hypotension []   Renal artery stenosis [x]   Worsening renal function, preexisting renal disease or dysfunction. Patient is on ramipril prior to admission but is currently on hold for AKI,   Clovis Riley 05/04/2015 10:26 AM

## 2015-05-04 NOTE — Progress Notes (Signed)
Nutrition Education Note  RD consulted for nutrition education regarding new onset CHF.  RD provided "Low Sodium Nutrition Therapy," "Sodium Free Flavoring," and "Sodium Content of Foods" handouts from the Academy of Nutrition and Dietetics. Reviewed patient's dietary recall. Provided examples on ways to decrease sodium intake in diet. Discouraged intake of processed foods and use of salt shaker. Encouraged fresh fruits and vegetables as well as whole grain sources of carbohydrates to maximize fiber intake.   RD discussed why it is important for patient to adhere to diet recommendations, and emphasized the role of fluids, foods to avoid, and importance of weighing self daily. Teach back method used.  Pt states that RN briefly covered this topic with him. Pt very interested in the topic and making changes to improve his health. He has a fair understanding of high-sodium containing foods and when asked why limiting sodium in his diet is important, pt reports "because if I do not it will kill me." Pt often cooks at home and does not add salt to his food.  Expect good compliance.  Body mass index is 24.48 kg/(m^2). Pt meets criteria for normal weight based on current BMI.  Current diet order is Regular, patient is consuming approximately 100% of meals at this time. Labs and medications reviewed. No further nutrition interventions warranted at this time. RD contact information provided. If additional nutrition issues arise, please re-consult RD.      [name], RD, LDN Inpatient Clinical Dietitian Pager # [phone] After hours/weekend pager # [phone]

## 2015-05-05 DIAGNOSIS — R1310 Dysphagia, unspecified: Secondary | ICD-10-CM

## 2015-05-05 LAB — CBC
HEMATOCRIT: 42.1 % (ref 39.0–52.0)
Hemoglobin: 14.1 g/dL (ref 13.0–17.0)
MCH: 33.3 pg (ref 26.0–34.0)
MCHC: 33.5 g/dL (ref 30.0–36.0)
MCV: 99.5 fL (ref 78.0–100.0)
Platelets: 209 10*3/uL (ref 150–400)
RBC: 4.23 MIL/uL (ref 4.22–5.81)
RDW: 13.6 % (ref 11.5–15.5)
WBC: 15.4 10*3/uL — AB (ref 4.0–10.5)

## 2015-05-05 LAB — BASIC METABOLIC PANEL
ANION GAP: 11 (ref 5–15)
BUN: 38 mg/dL — ABNORMAL HIGH (ref 6–20)
CO2: 18 mmol/L — AB (ref 22–32)
Calcium: 8.3 mg/dL — ABNORMAL LOW (ref 8.9–10.3)
Chloride: 103 mmol/L (ref 101–111)
Creatinine, Ser: 2.05 mg/dL — ABNORMAL HIGH (ref 0.61–1.24)
GFR calc Af Amer: 39 mL/min — ABNORMAL LOW (ref >60)
GFR calc non Af Amer: 33 mL/min — ABNORMAL LOW (ref >60)
GLUCOSE: 88 mg/dL (ref 65–99)
POTASSIUM: 4.2 mmol/L (ref 3.5–5.1)
Sodium: 132 mmol/L — ABNORMAL LOW (ref 135–145)

## 2015-05-05 LAB — GLUCOSE, CAPILLARY
Glucose-Capillary: 66 mg/dL (ref 65–99)
Glucose-Capillary: 75 mg/dL (ref 65–99)

## 2015-05-05 MED ORDER — MAGIC MOUTHWASH
5.0000 mL | Freq: Four times a day (QID) | ORAL | Status: DC
  Administered 2015-05-05 – 2015-05-14 (×35): 5 mL via ORAL
  Filled 2015-05-05 (×40): qty 5

## 2015-05-05 NOTE — Progress Notes (Signed)
Speech Language Pathology Treatment: Dysphagia  Patient Details Name: [name] MRN: [id number] DOB: [birth year] Today's Date: 05/05/2015 Time: 9753-0051 SLP Time Calculation (min) (ACUTE ONLY): 33 min  Assessment / Plan / Recommendation Clinical Impression  Today is pt's birthday!  He reports his swallowing to be the same.  Secretions appear to be large concern as pt coughed and expectorated viscous secretions with HOB elevation.  Pt was observed consuming ginger- ale, water, and ice cream.  NO indications of aspiration or dysphagia with po observed.  Pt did demonstrate overt odynophagia with intake of gingerale - advised avoid carbonation and acidic items.    SLP to sign off as good intake documented and pt is managing his dysphagia independently.     HPI Other Pertinent Information: 59 yo male adm to [facility] with neck edema.  Pt with h/o HAs, pna, NSTEMI, leukocytosis.  CT showed right submandibular gland asymmetrical enlargement.  Plan is for MRI of neck today.  CXR upon admit was negative.  Pt had difficulties with throat swelling in September as well as pt had xray completed at that time.  Pt reports difficulty swallowing foods and frequent expectoration of secretions.  He also pointed to midesophagus to indicate area of slow clearance.  Per doc flowsheets and pt report, he is consuming 100% of intake.     Pertinent Vitals Pain Assessment: No/denies pain  SLP Plan  All goals met    Recommendations Diet recommendations: Regular;Thin liquid (avoid acidic or carbonated beverages) Liquids provided via: Cup;Straw Medication Administration: Whole meds with liquid Supervision: Patient able to self feed Compensations: Small sips/bites;Slow rate;Follow solids with liquid Postural Changes and/or Swallow Maneuvers: Seated upright 90 degrees;Upright 30-60 min after meal              Oral Care Recommendations: Oral care BID Follow up Recommendations: None Plan: All goals met    [place], [place] [facility] SLP [phone]

## 2015-05-05 NOTE — Progress Notes (Signed)
Progress Note   [name] XMI:[id number] DOB: [birth year] DOA: 05/03/2015 PCP: Charolette Forward, MD   Brief Narrative:   [name] is an 59 y.o. male with a PMH of hypertension, chronic combined CHF, CAD, NSTEMI (on aspirin and plavix), CKD stage 3 who was admitted 05/03/15 with chief complaint of gradually worsening neck swelling. In ED, pt was hemodynamically stable. BP was 91/61, HR 92. Blood work revealed WBC count of 16.8, creatinine 2.26 (baseline 8 months ago 1.9). CT renal stone study showed no hydronephrosis or urolithiasis. LFT's were as follows: AST 299, ALT 283, TB 3.9. His recent abd [place] 04/30/15 showed mild gallbladder wall thickening probably attributable to nondistention/contraction. No gallstones. Other imaging studies included, CXR which showed no acute findings. X ray of the neck showed no acute findings. CT neck showed asymmetric enlargement of the right submandibular gland relative to the left, nonspecific but could be due to infection, inflammation or possibly tumor.   Assessment/Plan:   Principal Problem:   Sepsis due to cellulitis [facility]) with sepsis associated hypotension, next well and leukocytosis - Sepsis criteria met 10/17 with hypotension, fever, tachycardia, leukocytosis and lactic acidosis. - Source of infection is likely neck cellulitis.  - Pt was given clinda on admission but this was changed to broad spectrum abx, vanco and zosyn for sepsis. - CT neck on admission demonstrated asymmetric enlargement of the right submandibular gland relative to the left. - MRI of the neck 05/04/15 consistent with cellulitis. No evidence of salivary gland mass or other abnormalities. - Follow up blood culture results. Pro calcitonin elevation consistent with infection.   Active Problems:  Acute renal failure superimposed on stage 3 chronic kidney disease (HCC) - Baseline creatinine about 8 months ago 1.9. - Creatinine on this admission 2.26, possibly from lasix  and lisinopril and both of those meds placed on hold. - CT renal stone protocol showed no hydronephrosis or urolithiasis.  - Provided with gentle hydration. Creatinine slowly trending back down and is almost back to baseline values.   Chronic combined systolic (congestive) and diastolic (congestive) heart failure (HCC) - Last 2 D ECHO in 12/2014 with EF of 20%. - Compensated.    Dysphagia - Evaluated by speech therapy 05/04/15: Dysphagia 3 diet recommended.  Oral care TID.  MMW ordered. - Chest x-ray clear on admission.    Benign essential HTN - BP low in the setting of sepsis, so BP meds on hold: coreg, ramipril, imdur, lasix.   History of non-ST elevation myocardial infarction (NSTEMI) - Stable, no reports of chest pain.  - Sinus rhythm on 12 lead EKG. - Continue aspirin and plavix. - Continue statin therapy.   Hyponatremia - Likely due to CHF etiology. Sodium 132 today.   Abnormal LFTs - Recent US done 10/13 showed no acute findings, no gallstones seen. - LFTs trending down, maybe secondary to shock liver given hypotension. - Will continue to trend LFT's for now.  - May need HIDA scan if no significant improvement in LFT's.   Polysubstance abuse / Noncompliance / Homelessness - SW consulted.  - UDS and alcohol level WNL    DVT Prophylaxis  - SCD's bilaterally, aspirin, plavix.   Code Status: Full.  Family Communication: No family at the bedside. Disposition Plan: Home once neck swelling improves. Likely by 05/06/15.  IV Access:    Peripheral IV   Procedures and diagnostic studies:   Dg Neck Soft Tissue  05/03/2015  CLINICAL DATA:  Neck pain and swelling. EXAM: NECK SOFT TISSUES -  1+ VIEW COMPARISON:  None. FINDINGS: There is no evidence of retropharyngeal soft tissue swelling or epiglottic enlargement. The cervical airway is unremarkable and no radio-opaque foreign body identified. Cervical spine degenerative changes noted. IMPRESSION: No acute  findings. Electronically Signed   By: Earle Gell M.D.   On: 05/03/2015 11:06   Dg Chest 2 View  05/03/2015  CLINICAL DATA:  Pt c/o productive cough, SOB and weakness x 2days. HTN, former smoker (quit in 2005). Hx of MI and pneumonia EXAM: CHEST  2 VIEW COMPARISON:  Chest radiograph of 04/30/2015 FINDINGS: Midline trachea. Normal heart size and mediastinal contours. No pleural effusion or pneumothorax. Clear lungs. IMPRESSION: No acute cardiopulmonary disease. Electronically Signed   By: Abigail Miyamoto M.D.   On: 05/03/2015 11:06   Ct Soft Tissue Neck Wo Contrast  05/03/2015  CLINICAL DATA:  Throat swelling and productive cough, worsening. Facial swelling. EXAM: CT NECK WITHOUT CONTRAST TECHNIQUE: Multidetector CT imaging of the neck was performed following the standard protocol without intravenous contrast. COMPARISON:  None. FINDINGS: Lack of IV contrast material limits the exam. There is diffuse infiltration of subcutaneous fatty tissues which may be due to edema or less likely cellulitis. The right submandibular gland is asymmetrically enlarged relative to the left with subtle low attenuation within it and mild haziness of its borders. Major salivary glands are otherwise unremarkable. No lymphadenopathy is seen. No abscess is identified. The lung apices are clear. No lytic or sclerotic bony lesion is seen. Cervical spondylosis is noted. IMPRESSION: Asymmetric enlargement of the right submandibular gland relative to the left is nonspecific but could be due to infection, inflammation or possibly tumor. MRI of the neck with contrast if possible after the patient's acute episode has passed is recommended for further evaluation. Negative for abscess. Diffuse subcutaneous edema is nonspecific and likely due to anasarca rather than cellulitis. Electronically Signed   By: Inge Rise M.D.   On: 05/03/2015 13:18   Mr Neck Soft Tissue Only W Wo Contrast  05/04/2015  CLINICAL DATA:  Initial evaluation for  throat and facial swelling, productive cough, recently worsened. Patient also with leukocytosis and concern for cellulitis of neck. Evaluate submandibular gland which was question to be asymmetrically enlarged on prior CT. EXAM: MR NECK SOFT TISSUE ONLY WITHOUT AND WITH CONTRAST TECHNIQUE: Multiplanar, multisequence MR imaging was performed both before and after administration of intravenous contrast. CONTRAST:  36m MULTIHANCE GADOBENATE DIMEGLUMINE 529 MG/ML IV SOLN COMPARISON:  Prior neck CT from 05/03/2015. FINDINGS: Study is degraded by motion artifact. Visualized portions of the brain demonstrate no acute abnormality. Mucosal thickening within the visualized paranasal sinuses. No air-fluid levels to suggest active sinus infection. Mastoid air cells are grossly clear. The salivary glands including the parotid glands and submandibular glands are relatively symmetric and normal in appearance bilaterally. On today's study, the submandibular glands are fairly symmetric in appearance bilaterally, although the right may be slightly larger than the left, felt to be within normal limits for normal size variation. No asymmetric enhancement or focal mass lesion identified within the right submandibular gland to suggest underlying neoplasm. The submandibular glands enhance in a symmetric fashion bilaterally. Scattered edema involving primarily the submandibular spaces bilaterally, similar to findings seen on prior CT. Given the provided history, finding is concerning for possible cellulitis. No drainable fluid collection or abscess. This is better appreciated on prior CT. No significant enhancement. Oral cavity demonstrates a normal appearance. Palatine tonsils normal. Parapharyngeal fat well-preserved. Nasopharynx and oropharynx within normal limits. Epiglottis is normal.  Vallecula is clear. Hypopharynx and supraglottic larynx demonstrate no acute abnormality. Thyroid gland grossly unremarkable. No pathologically enlarged  lymph nodes identified within the neck. There are 3 mildly prominent level 1A submental nodes measuring up to 7 mm, likely reactive. Mildly prominent left level 2 lymph nodes measure at the upper limits of normal at approximately 1 cm in short axis. Partially visualized superior mediastinum demonstrates no acute abnormality. Partially visualized lungs are grossly clear. Mild multilevel degenerative changes noted within the visualized cervical spine. Marrow signal intensity within normal limits. Signal intensity within the visualized cord is normal. IMPRESSION: 1. Motion degraded study with no discrete salivary gland mass or other abnormality identified. 2. Grossly similar subcutaneous edema involving primarily the bilateral submandibular spaces. Again, while this finding is nonspecific, possible infection could have this appearance given the history of leukocytosis and concern for cellulitis. This is better evaluated on prior CT. No discrete abscess. Electronically Signed   By: Jeannine Boga M.D.   On: 05/04/2015 22:42   Ct Renal Stone Study  05/03/2015  CLINICAL DATA:  Mid abdominal pain. Elevated liver function tests. Renal failure. EXAM: CT ABDOMEN AND PELVIS WITHOUT CONTRAST TECHNIQUE: Multidetector CT imaging of the abdomen and pelvis was performed following the standard protocol without IV contrast. COMPARISON:  04/23/2015 FINDINGS: Lower chest:  No acute findings. Hepatobiliary: No mass visualized on this un-enhanced exam. Gallbladder is unremarkable. No evidence of biliary ductal dilatation on this unenhanced exam. Pancreas: No mass or inflammatory process identified on this un-enhanced exam. Spleen: Within normal limits in size. Adrenals/Urinary Tract: No evidence of urolithiasis or hydronephrosis. Mild bilateral perinephric stranding is nonspecific and stable in appearance since previous study. Stomach/Bowel: No evidence of obstruction, inflammatory process, or abnormal fluid collections. Normal  appendix visualized. Vascular/Lymphatic: No pathologically enlarged lymph nodes. No evidence of abdominal aortic aneurysm. Reproductive: Stable moderately enlarged prostate Other: None. Musculoskeletal:  No suspicious bone lesions identified. IMPRESSION: No evidence of urolithiasis, hydronephrosis, or other acute findings. Stable moderately enlarged prostate. Electronically Signed   By: Earle Gell M.D.   On: 05/03/2015 13:13     Medical Consultants:    None.  Anti-Infectives:   Anti-infectives    Start     Dose/Rate Route Frequency Ordered Stop   05/04/15 1100  piperacillin-tazobactam (ZOSYN) IVPB 3.375 g     3.375 g 12.5 mL/hr over 240 Minutes Intravenous 3 times per day 05/04/15 1003     05/04/15 0600  vancomycin (VANCOCIN) IVPB 750 mg/150 ml premix     750 mg 150 mL/hr over 60 Minutes Intravenous Every 24 hours 05/04/15 0557     05/03/15 2200  clindamycin (CLEOCIN) IVPB 600 mg  Status:  Discontinued     600 mg 100 mL/hr over 30 Minutes Intravenous 3 times per day 05/03/15 1524 05/04/15 0225   05/03/15 1330  clindamycin (CLEOCIN) IVPB 600 mg     600 mg 100 mL/hr over 30 Minutes Intravenous  Once 05/03/15 1326 05/03/15 1457      Subjective:   [name] complains of difficulty swallowing and odynophagia.  Still has some neck swelling. Has a cough with thick secretions.  Objective:    Filed Vitals:   05/04/15 1444 05/04/15 1809 05/04/15 2219 05/05/15 0523  BP: '90/54 88/54 96/52 ' 102/60  Pulse: 105 98 110 96  Temp: 98.7 F (37.1 C)  98.1 F (36.7 C) 99.2 F (37.3 C)  TempSrc: Oral  Oral Oral  Resp: '16  18 18  ' Height:      Weight:  75.3 kg (166 lb 0.1 oz)  SpO2: 95%  97% 100%    Intake/Output Summary (Last 24 hours) at 05/05/15 0823 Last data filed at 05/05/15 0700  Gross per 24 hour  Intake   1658 ml  Output    550 ml  Net   1108 ml   Filed Weights   05/03/15 1459 05/04/15 0529 05/05/15 0523  Weight: 70.6 kg (155 lb 10.3 oz) 73 kg (160 lb 15 oz) 75.3 kg  (166 lb 0.1 oz)    Exam: Gen:  NAD Neck: Swelling noted. Cardiovascular:  RRR, No M/R/G Respiratory:  Lungs CTAB Gastrointestinal:  Abdomen soft, NT/ND, + BS Extremities:  No C/E/C   Data Reviewed:    Labs: Basic Metabolic Panel:  Recent Labs Lab 04/30/15 1459 05/03/15 1009 05/03/15 1542 05/04/15 0255 05/05/15 0525  NA 134* 131* 131* 131* 132*  K 4.2 4.8 4.4 4.9 4.2  CL 102 101 103 104 103  CO2 22 18* 20* 20* 18*  GLUCOSE 108* 63* 98 136* 88  BUN 26* 40* 45* 50* 38*  CREATININE 1.94* 2.26* 2.31* 2.36* 2.05*  CALCIUM 9.2 8.9 8.0* 7.9* 8.3*  MG  --   --  1.9  --   --   PHOS  --   --  3.8  --   --    GFR Estimated Creatinine Clearance: 36.6 mL/min (by C-G formula based on Cr of 2.05). Liver Function Tests:  Recent Labs Lab 04/30/15 1459 05/03/15 1009 05/03/15 1542 05/04/15 0255  AST 169* 299* 226* 195*  ALT 130* 283* 229* 218*  ALKPHOS 116 104 78 93  BILITOT 2.0* 3.9* 3.0* 2.7*  PROT 7.2 6.9 5.5* 5.0*  ALBUMIN 4.0 3.7 3.0* 2.8*    Recent Labs Lab 04/30/15 1724 05/03/15 1009  LIPASE 15* 16*   CBC:  Recent Labs Lab 04/30/15 1459 05/03/15 1009 05/03/15 1542 05/04/15 0255 05/05/15 0525  WBC 7.1 16.8* 17.4* 16.2* 15.4*  NEUTROABS 4.2 10.4* 11.4*  --   --   HGB 14.9 17.8* 14.4 13.4 14.1  HCT 43.4 51.4 41.7 39.3 42.1  MCV 100.2* 99.8 100.2* 99.7 99.5  PLT 212 253 230 225 209   BNP (last 3 results)  Recent Labs  04/13/15 1012  PROBNP 3040.00*   CBG:  Recent Labs Lab 05/04/15 0719 05/05/15 0738 05/05/15 0810  GLUCAP 112* 66 75   Thyroid function studies:  Recent Labs  05/03/15 1542  TSH 3.213   Sepsis Labs:  Recent Labs Lab 05/03/15 1009 05/03/15 1542 05/04/15 0255 05/04/15 0541 05/04/15 1130 05/04/15 1400 05/05/15 0525  PROCALCITON  --   --   --   --  3.23  --   --   WBC 16.8* 17.4* 16.2*  --   --   --  15.4*  LATICACIDVEN  --   --  2.7* 2.9* 3.6* 3.6*  --    Microbiology No results found for this or any previous  visit (from the past 240 hour(s)).   Medications:   . aspirin EC  81 mg Oral Daily  . atorvastatin  80 mg Oral Daily  . carvedilol  3.125 mg Oral BID WC  . clopidogrel  75 mg Oral Daily  . diphenhydrAMINE  25 mg Oral Q6H  . famotidine  20 mg Oral Daily  . mirabegron ER  25 mg Oral Daily  . piperacillin-tazobactam (ZOSYN)  IV  3.375 g Intravenous 3 times per day  . sodium chloride  3 mL Intravenous Q12H  . vancomycin  750  mg Intravenous Q24H   Continuous Infusions: . sodium chloride 30 mL/hr at 05/04/15 1204    Time spent: 25 minutes.   LOS: 1 day   Lindzee Gouge  [facility] Pager [phone]. If unable to reach me by pager, please call my cell phone at [phone].  *Please refer to [email or web address], password [email or web address] to get updated schedule on who will round on this patient, as hospitalists switch teams weekly. If 7PM-7AM, please contact night-coverage at [email or web address], password [email or web address] for any overnight needs.  05/05/2015, 8:23 AM

## 2015-05-06 LAB — COMPREHENSIVE METABOLIC PANEL
ALT: 419 U/L — ABNORMAL HIGH (ref 17–63)
AST: 547 U/L — AB (ref 15–41)
Albumin: 2.5 g/dL — ABNORMAL LOW (ref 3.5–5.0)
Alkaline Phosphatase: 155 U/L — ABNORMAL HIGH (ref 38–126)
Anion gap: 10 (ref 5–15)
BUN: 33 mg/dL — ABNORMAL HIGH (ref 6–20)
CHLORIDE: 105 mmol/L (ref 101–111)
CO2: 17 mmol/L — AB (ref 22–32)
Calcium: 8 mg/dL — ABNORMAL LOW (ref 8.9–10.3)
Creatinine, Ser: 2.1 mg/dL — ABNORMAL HIGH (ref 0.61–1.24)
GFR, EST AFRICAN AMERICAN: 37 mL/min — AB (ref >60)
GFR, EST NON AFRICAN AMERICAN: 32 mL/min — AB (ref >60)
Glucose, Bld: 69 mg/dL (ref 65–99)
POTASSIUM: 4.3 mmol/L (ref 3.5–5.1)
SODIUM: 132 mmol/L — AB (ref 135–145)
Total Bilirubin: 3 mg/dL — ABNORMAL HIGH (ref 0.3–1.2)
Total Protein: 4.7 g/dL — ABNORMAL LOW (ref 6.5–8.1)

## 2015-05-06 LAB — CBC
HEMATOCRIT: 35.6 % — AB (ref 39.0–52.0)
HEMOGLOBIN: 12.2 g/dL — AB (ref 13.0–17.0)
MCH: 33.8 pg (ref 26.0–34.0)
MCHC: 34.3 g/dL (ref 30.0–36.0)
MCV: 98.6 fL (ref 78.0–100.0)
Platelets: 199 10*3/uL (ref 150–400)
RBC: 3.61 MIL/uL — ABNORMAL LOW (ref 4.22–5.81)
RDW: 13.6 % (ref 11.5–15.5)
WBC: 19.9 10*3/uL — AB (ref 4.0–10.5)

## 2015-05-06 LAB — URINE CULTURE: Culture: NO GROWTH

## 2015-05-06 LAB — GLUCOSE, CAPILLARY: Glucose-Capillary: 45 mg/dL — ABNORMAL LOW (ref 65–99)

## 2015-05-06 MED ORDER — SODIUM CHLORIDE 0.9 % IV BOLUS (SEPSIS): 500.0000 mL | Freq: Once | INTRAVENOUS | Status: DC

## 2015-05-06 MED ORDER — SODIUM CHLORIDE 0.9 % IV BOLUS (SEPSIS)
500.0000 mL | Freq: Once | INTRAVENOUS | Status: AC
  Administered 2015-05-06: 500 mL via INTRAVENOUS

## 2015-05-06 MED ORDER — FUROSEMIDE 10 MG/ML IJ SOLN
40.0000 mg | Freq: Once | INTRAMUSCULAR | Status: AC
  Administered 2015-05-06: 40 mg via INTRAVENOUS
  Filled 2015-05-06: qty 4

## 2015-05-06 NOTE — Progress Notes (Signed)
CSW received referral for Current Substance Abuse.   CSW attempted to meet with pt to complete assessment, but pt sleeping soundly at this time.   CSW to follow up at a later time to complete assessment.  CSW to continue to follow.  Alison Murray, MSW, [place] [employer] [phone]

## 2015-05-06 NOTE — Progress Notes (Addendum)
Progress Note   Savian Mazon HWY:[id number] DOB: 1956-01-05 DOA: 05/03/2015 PCP: Charolette Forward, MD   Brief Narrative:   [name] is an 59 y.o. male with a PMH of hypertension, chronic combined CHF, CAD, NSTEMI (on aspirin and plavix), CKD stage 3 who was admitted 05/03/15 with chief complaint of gradually worsening neck swelling. In ED, pt was hemodynamically stable. BP was 91/61, HR 92. Blood work revealed WBC count of 16.8, creatinine 2.26 (baseline 8 months ago 1.9). CT renal stone study showed no hydronephrosis or urolithiasis. LFT's were as follows: AST 299, ALT 283, TB 3.9. His recent abd [place] 04/30/15 showed mild gallbladder wall thickening probably attributable to nondistention/contraction. No gallstones. Other imaging studies included, CXR which showed no acute findings. X ray of the neck showed no acute findings. CT neck showed asymmetric enlargement of the right submandibular gland relative to the left, nonspecific but could be due to infection, inflammation or possibly tumor.   Assessment/Plan:   Principal Problem:   Sepsis due to cellulitis [facility]) with sepsis associated hypotension, next well and leukocytosis - Sepsis criteria met 05/04/15 with hypotension, fever, tachycardia, leukocytosis and lactic acidosis. - Source of infection is likely neck cellulitis.  - Pt was given clindamycin on admission but this was changed to broad spectrum abx, vanco and zosyn for sepsis. - CT neck on admission demonstrated asymmetric enlargement of the right submandibular gland relative to the left. - MRI of the neck 05/04/15 consistent with cellulitis. No evidence of salivary gland mass or other abnormalities. - Follow up blood culture results. Pro calcitonin elevation consistent with infection.   Active Problems:  Acute renal failure superimposed on stage 3 chronic kidney disease (HCC) - Baseline creatinine about 8 months ago 1.9. - Creatinine on this admission 2.26, possibly  from lasix and lisinopril and both of those meds remain on hold. - CT renal stone protocol showed no hydronephrosis or urolithiasis.  - Provided with gentle hydration. Creatinine slowly trending back down and is almost back to baseline values.   Chronic combined systolic (congestive) and diastolic (congestive) heart failure (HCC) - Last 2 D ECHO in 12/2014 with EF of 20%. - Compensated.    Dysphagia - Evaluated by speech therapy 05/04/15: Dysphagia 3 diet recommended.  Oral care TID.  MMW ordered. - Chest x-ray clear on admission.    Benign essential HTN - BP low in the setting of sepsis, so BP meds on hold: coreg, ramipril, imdur, lasix.   History of non-ST elevation myocardial infarction (NSTEMI) - Stable, no reports of chest pain.  - Sinus rhythm on 12 lead EKG. - Continue aspirin and plavix. - Continue statin therapy.   Hyponatremia - Likely due to CHF etiology. Sodium stable at 132.   Abnormal LFTs - Recent US done 10/13 showed no acute findings, no gallstones seen. - LFTs worse today. Will check HIDA scan & acute hepatitis serologies. - Hold Lipitor.   Polysubstance abuse / Noncompliance / Homelessness - SW consulted.  - UDS and alcohol level WNL    DVT Prophylaxis  - SCD's bilaterally, aspirin, plavix.   Code Status: Full.  Family Communication: No family at the bedside. Disposition Plan: Home once source of persistent elevation of LFTs elucidated, SW to assist with disposition given homelessness.  IV Access:    Peripheral IV   Procedures and diagnostic studies:   Dg Neck Soft Tissue  05/03/2015  CLINICAL DATA:  Neck pain and swelling. EXAM: NECK SOFT TISSUES - 1+ VIEW COMPARISON:  None. FINDINGS:  There is no evidence of retropharyngeal soft tissue swelling or epiglottic enlargement. The cervical airway is unremarkable and no radio-opaque foreign body identified. Cervical spine degenerative changes noted. IMPRESSION: No acute findings.  Electronically Signed   By: Earle Gell M.D.   On: 05/03/2015 11:06   Dg Chest 2 View  05/03/2015  CLINICAL DATA:  Pt c/o productive cough, SOB and weakness x 2days. HTN, former smoker (quit in 2005). Hx of MI and pneumonia EXAM: CHEST  2 VIEW COMPARISON:  Chest radiograph of 04/30/2015 FINDINGS: Midline trachea. Normal heart size and mediastinal contours. No pleural effusion or pneumothorax. Clear lungs. IMPRESSION: No acute cardiopulmonary disease. Electronically Signed   By: Abigail Miyamoto M.D.   On: 05/03/2015 11:06   Ct Soft Tissue Neck Wo Contrast  05/03/2015  CLINICAL DATA:  Throat swelling and productive cough, worsening. Facial swelling. EXAM: CT NECK WITHOUT CONTRAST TECHNIQUE: Multidetector CT imaging of the neck was performed following the standard protocol without intravenous contrast. COMPARISON:  None. FINDINGS: Lack of IV contrast material limits the exam. There is diffuse infiltration of subcutaneous fatty tissues which may be due to edema or less likely cellulitis. The right submandibular gland is asymmetrically enlarged relative to the left with subtle low attenuation within it and mild haziness of its borders. Major salivary glands are otherwise unremarkable. No lymphadenopathy is seen. No abscess is identified. The lung apices are clear. No lytic or sclerotic bony lesion is seen. Cervical spondylosis is noted. IMPRESSION: Asymmetric enlargement of the right submandibular gland relative to the left is nonspecific but could be due to infection, inflammation or possibly tumor. MRI of the neck with contrast if possible after the patient's acute episode has passed is recommended for further evaluation. Negative for abscess. Diffuse subcutaneous edema is nonspecific and likely due to anasarca rather than cellulitis. Electronically Signed   By: Inge Rise M.D.   On: 05/03/2015 13:18   Mr Neck Soft Tissue Only W Wo Contrast  05/04/2015  CLINICAL DATA:  Initial evaluation for throat and  facial swelling, productive cough, recently worsened. Patient also with leukocytosis and concern for cellulitis of neck. Evaluate submandibular gland which was question to be asymmetrically enlarged on prior CT. EXAM: MR NECK SOFT TISSUE ONLY WITHOUT AND WITH CONTRAST TECHNIQUE: Multiplanar, multisequence MR imaging was performed both before and after administration of intravenous contrast. CONTRAST:  6m MULTIHANCE GADOBENATE DIMEGLUMINE 529 MG/ML IV SOLN COMPARISON:  Prior neck CT from 05/03/2015. FINDINGS: Study is degraded by motion artifact. Visualized portions of the brain demonstrate no acute abnormality. Mucosal thickening within the visualized paranasal sinuses. No air-fluid levels to suggest active sinus infection. Mastoid air cells are grossly clear. The salivary glands including the parotid glands and submandibular glands are relatively symmetric and normal in appearance bilaterally. On today's study, the submandibular glands are fairly symmetric in appearance bilaterally, although the right may be slightly larger than the left, felt to be within normal limits for normal size variation. No asymmetric enhancement or focal mass lesion identified within the right submandibular gland to suggest underlying neoplasm. The submandibular glands enhance in a symmetric fashion bilaterally. Scattered edema involving primarily the submandibular spaces bilaterally, similar to findings seen on prior CT. Given the provided history, finding is concerning for possible cellulitis. No drainable fluid collection or abscess. This is better appreciated on prior CT. No significant enhancement. Oral cavity demonstrates a normal appearance. Palatine tonsils normal. Parapharyngeal fat well-preserved. Nasopharynx and oropharynx within normal limits. Epiglottis is normal. Vallecula is clear. Hypopharynx and supraglottic  larynx demonstrate no acute abnormality. Thyroid gland grossly unremarkable. No pathologically enlarged lymph  nodes identified within the neck. There are 3 mildly prominent level 1A submental nodes measuring up to 7 mm, likely reactive. Mildly prominent left level 2 lymph nodes measure at the upper limits of normal at approximately 1 cm in short axis. Partially visualized superior mediastinum demonstrates no acute abnormality. Partially visualized lungs are grossly clear. Mild multilevel degenerative changes noted within the visualized cervical spine. Marrow signal intensity within normal limits. Signal intensity within the visualized cord is normal. IMPRESSION: 1. Motion degraded study with no discrete salivary gland mass or other abnormality identified. 2. Grossly similar subcutaneous edema involving primarily the bilateral submandibular spaces. Again, while this finding is nonspecific, possible infection could have this appearance given the history of leukocytosis and concern for cellulitis. This is better evaluated on prior CT. No discrete abscess. Electronically Signed   By: Jeannine Boga M.D.   On: 05/04/2015 22:42   Ct Renal Stone Study  05/03/2015  CLINICAL DATA:  Mid abdominal pain. Elevated liver function tests. Renal failure. EXAM: CT ABDOMEN AND PELVIS WITHOUT CONTRAST TECHNIQUE: Multidetector CT imaging of the abdomen and pelvis was performed following the standard protocol without IV contrast. COMPARISON:  04/23/2015 FINDINGS: Lower chest:  No acute findings. Hepatobiliary: No mass visualized on this un-enhanced exam. Gallbladder is unremarkable. No evidence of biliary ductal dilatation on this unenhanced exam. Pancreas: No mass or inflammatory process identified on this un-enhanced exam. Spleen: Within normal limits in size. Adrenals/Urinary Tract: No evidence of urolithiasis or hydronephrosis. Mild bilateral perinephric stranding is nonspecific and stable in appearance since previous study. Stomach/Bowel: No evidence of obstruction, inflammatory process, or abnormal fluid collections. Normal  appendix visualized. Vascular/Lymphatic: No pathologically enlarged lymph nodes. No evidence of abdominal aortic aneurysm. Reproductive: Stable moderately enlarged prostate Other: None. Musculoskeletal:  No suspicious bone lesions identified. IMPRESSION: No evidence of urolithiasis, hydronephrosis, or other acute findings. Stable moderately enlarged prostate. Electronically Signed   By: Earle Gell M.D.   On: 05/03/2015 13:13     Medical Consultants:    None.  Anti-Infectives:   Anti-infectives    Start     Dose/Rate Route Frequency Ordered Stop   05/04/15 1100  piperacillin-tazobactam (ZOSYN) IVPB 3.375 g     3.375 g 12.5 mL/hr over 240 Minutes Intravenous 3 times per day 05/04/15 1003     05/04/15 0600  vancomycin (VANCOCIN) IVPB 750 mg/150 ml premix     750 mg 150 mL/hr over 60 Minutes Intravenous Every 24 hours 05/04/15 0557     05/03/15 2200  clindamycin (CLEOCIN) IVPB 600 mg  Status:  Discontinued     600 mg 100 mL/hr over 30 Minutes Intravenous 3 times per day 05/03/15 1524 05/04/15 0225   05/03/15 1330  clindamycin (CLEOCIN) IVPB 600 mg     600 mg 100 mL/hr over 30 Minutes Intravenous  Once 05/03/15 1326 05/03/15 1457      Subjective:   Forde Radon reports swelling in left hand and around foreskin of penis.  No significant SOB, but continues to have a dry cough.  Objective:    Filed Vitals:   05/05/15 2300 05/06/15 0000 05/06/15 0500 05/06/15 0600  BP: '85/52 98/48 82/49 ' 91/45  Pulse:  60 75 62  Temp:   99.1 F (37.3 C)   TempSrc:   Oral   Resp:   26   Height:      Weight:   80.6 kg (177 lb 11.1 oz)   SpO2:  100%     Intake/Output Summary (Last 24 hours) at 05/06/15 0830 Last data filed at 05/05/15 1850  Gross per 24 hour  Intake    885 ml  Output    125 ml  Net    760 ml   Filed Weights   05/04/15 0529 05/05/15 0523 05/06/15 0500  Weight: 73 kg (160 lb 15 oz) 75.3 kg (166 lb 0.1 oz) 80.6 kg (177 lb 11.1 oz)    Exam: Gen:  NAD Neck: Swelling  improved. Cardiovascular:  RRR, No M/R/G Respiratory:  Lungs CTAB Gastrointestinal:  Abdomen soft, NT/ND, + BS GU: Swelling around foreskin Extremities:  Left hand swollen, no swelling BLE   Data Reviewed:    Labs: Basic Metabolic Panel:  Recent Labs Lab 05/03/15 1009 05/03/15 1542 05/04/15 0255 05/05/15 0525 05/06/15 0519  NA 131* 131* 131* 132* 132*  K 4.8 4.4 4.9 4.2 4.3  CL 101 103 104 103 105  CO2 18* 20* 20* 18* 17*  GLUCOSE 63* 98 136* 88 69  BUN 40* 45* 50* 38* 33*  CREATININE 2.26* 2.31* 2.36* 2.05* 2.10*  CALCIUM 8.9 8.0* 7.9* 8.3* 8.0*  MG  --  1.9  --   --   --   PHOS  --  3.8  --   --   --    GFR Estimated Creatinine Clearance: 35.7 mL/min (by C-G formula based on Cr of 2.1). Liver Function Tests:  Recent Labs Lab 04/30/15 1459 05/03/15 1009 05/03/15 1542 05/04/15 0255 05/06/15 0519  AST 169* 299* 226* 195* 547*  ALT 130* 283* 229* 218* 419*  ALKPHOS 116 104 78 93 155*  BILITOT 2.0* 3.9* 3.0* 2.7* 3.0*  PROT 7.2 6.9 5.5* 5.0* 4.7*  ALBUMIN 4.0 3.7 3.0* 2.8* 2.5*    Recent Labs Lab 04/30/15 1724 05/03/15 1009  LIPASE 15* 16*   CBC:  Recent Labs Lab 04/30/15 1459 05/03/15 1009 05/03/15 1542 05/04/15 0255 05/05/15 0525 05/06/15 0519  WBC 7.1 16.8* 17.4* 16.2* 15.4* 19.9*  NEUTROABS 4.2 10.4* 11.4*  --   --   --   HGB 14.9 17.8* 14.4 13.4 14.1 12.2*  HCT 43.4 51.4 41.7 39.3 42.1 35.6*  MCV 100.2* 99.8 100.2* 99.7 99.5 98.6  PLT 212 253 230 225 209 199   BNP (last 3 results)  Recent Labs  04/13/15 1012  PROBNP 3040.00*   CBG:  Recent Labs Lab 05/04/15 0719 05/05/15 0738 05/05/15 0810 05/06/15 0740  GLUCAP 112* 66 75 45*   Thyroid function studies:  Recent Labs  05/03/15 1542  TSH 3.213   Sepsis Labs:  Recent Labs Lab 05/03/15 1542 05/04/15 0255 05/04/15 0541 05/04/15 1130 05/04/15 1400 05/05/15 0525 05/06/15 0519  PROCALCITON  --   --   --  3.23  --   --   --   WBC 17.4* 16.2*  --   --   --  15.4*  19.9*  LATICACIDVEN  --  2.7* 2.9* 3.6* 3.6*  --   --    Microbiology Recent Results (from the past 240 hour(s))  Culture, blood (routine x 2)     Status: None (Preliminary result)   Collection Time: 05/04/15  2:50 AM  Result Value Ref Range Status   Specimen Description BLOOD RIGHT ARM  Final   Special Requests BOTTLES DRAWN AEROBIC AND ANAEROBIC [place]  Final   Culture   Final    NO GROWTH 1 DAY Performed at [facility]    Report Status PENDING  Incomplete  Culture,  blood (routine x 2)     Status: None (Preliminary result)   Collection Time: 05/04/15  2:55 AM  Result Value Ref Range Status   Specimen Description BLOOD RIGHT HAND  Final   Special Requests BOTTLES DRAWN AEROBIC ONLY 10CC  Final   Culture   Final    NO GROWTH 1 DAY Performed at [facility]    Report Status PENDING  Incomplete     Medications:   . aspirin EC  81 mg Oral Daily  . atorvastatin  80 mg Oral Daily  . carvedilol  3.125 mg Oral BID WC  . clopidogrel  75 mg Oral Daily  . diphenhydrAMINE  25 mg Oral Q6H  . famotidine  20 mg Oral Daily  . magic mouthwash  5 mL Oral QID  . mirabegron ER  25 mg Oral Daily  . piperacillin-tazobactam (ZOSYN)  IV  3.375 g Intravenous 3 times per day  . sodium chloride  3 mL Intravenous Q12H  . vancomycin  750 mg Intravenous Q24H   Continuous Infusions: . sodium chloride 30 mL/hr at 05/05/15 2050    Time spent: 25 minutes.   LOS: 2 days   RAMA,CHRISTINA  [facility] Pager [phone]. If unable to reach me by pager, please call my cell phone at [phone].  *Please refer to [email or web address], password [email or web address] to get updated schedule on who will round on this patient, as hospitalists switch teams weekly. If 7PM-7AM, please contact night-coverage at [email or web address], password [email or web address] for any overnight needs.  05/06/2015, 8:30 AM

## 2015-05-06 NOTE — Progress Notes (Signed)
ANTIBIOTIC CONSULT NOTE - INITIAL  Pharmacy Consult for Vancomycin and zosyn Indication: neck Cellulitis, sepsis  No Known Allergies  Patient Measurements: Height: 5\' 8"  (172.7 cm) Weight: 177 lb 11.1 oz (80.6 kg) IBW/kg (Calculated) : 68.4  Vital Signs: Temp: 99.1 F (37.3 C) (10/19 0500) Temp Source: Oral (10/19 0500) BP: 91/45 mmHg (10/19 0600) Pulse Rate: 62 (10/19 0600) Intake/Output from previous day: 10/18 0701 - 10/19 0700 In: 885 [P.O.:480; I.V.:355; IV Piggyback:50] Out: 125 [Urine:125] Intake/Output from this shift: Total I/O In: -  Out: 300 [Urine:300]  Labs:  Recent Labs  05/04/15 0255 05/05/15 0525 05/06/15 0519  WBC 16.2* 15.4* 19.9*  HGB 13.4 14.1 12.2*  PLT 225 209 199  CREATININE 2.36* 2.05* 2.10*   Estimated Creatinine Clearance: 35.7 mL/min (by C-G formula based on Cr of 2.1). No results for input(s): VANCOTROUGH, VANCOPEAK, VANCORANDOM, GENTTROUGH, GENTPEAK, GENTRANDOM, TOBRATROUGH, TOBRAPEAK, TOBRARND, AMIKACINPEAK, AMIKACINTROU, AMIKACIN in the last 72 hours.   Microbiology: Recent Results (from the past 720 hour(s))  Culture, blood (routine x 2)     Status: None (Preliminary result)   Collection Time: 05/04/15  2:50 AM  Result Value Ref Range Status   Specimen Description BLOOD RIGHT ARM  Final   Special Requests BOTTLES DRAWN AEROBIC AND ANAEROBIC [place]  Final   Culture   Final    NO GROWTH 2 DAYS Performed at [facility]    Report Status PENDING  Incomplete  Culture, blood (routine x 2)     Status: None (Preliminary result)   Collection Time: 05/04/15  2:55 AM  Result Value Ref Range Status   Specimen Description BLOOD RIGHT HAND  Final   Special Requests BOTTLES DRAWN AEROBIC ONLY 10CC  Final   Culture   Final    NO GROWTH 1 DAY Performed at [facility]    Report Status PENDING  Incomplete  Culture, Urine     Status: None   Collection Time: 05/04/15 10:57 PM  Result Value Ref Range Status   Specimen  Description URINE, RANDOM  Final   Special Requests NONE  Final   Culture   Final    NO GROWTH 1 DAY Performed at [facility]    Report Status 05/06/2015 FINAL  Final    Medical History: Past Medical History  Diagnosis Date  . Hypertension   . Homelessness   . Urinary hesitancy   . Hypercholesterolemia   . NSTEMI (non-ST elevated myocardial infarction) ([place]) 01/07/2015  . Heart attack ([place]) 07/2014  . Walking pneumonia 07/2014  . Headache     "probably weekly" (01/07/2015)  . Arthritis     "all over" (01/07/2015)  . Anxiety   . Depression   . Noncompliance   . Polysubstance abuse     etoh, cocaine  . Ischemic dilated cardiomyopathy   . CKD (chronic kidney disease), stage IV ([place])   . History of echocardiogram 12/2014    EF 20-25%    Medications:  Anti-infectives    Start     Dose/Rate Route Frequency Ordered Stop   05/04/15 1100  piperacillin-tazobactam (ZOSYN) IVPB 3.375 g     3.375 g 12.5 mL/hr over 240 Minutes Intravenous 3 times per day 05/04/15 1003     05/04/15 0600  vancomycin (VANCOCIN) IVPB 750 mg/150 ml premix     750 mg 150 mL/hr over 60 Minutes Intravenous Every 24 hours 05/04/15 0557     05/03/15 2200  clindamycin (CLEOCIN) IVPB 600 mg  Status:  Discontinued  600 mg 100 mL/hr over 30 Minutes Intravenous 3 times per day 05/03/15 1524 05/04/15 0225   05/03/15 1330  clindamycin (CLEOCIN) IVPB 600 mg     600 mg 100 mL/hr over 30 Minutes Intravenous  Once 05/03/15 1326 05/03/15 1457     Assessment: Patient's a 59 y.o M on vancomycin and zosyn for neck cellulitis/sepsis.  - afeb, wbc up 19.9, scr elevated at 2.10 but relatively stable today (crcl~35)  10/17 >> pip/tazo >> 10/17 >> vanco >>  10/17 bcx x2: pending 10/17 urine: neg FINAL   Goal of Therapy:  Vancomycin trough level 10-15 mcg/ml  Plan:   Continue vancomyin 750mg  q24h.  With renal insufficiency, will check trough level on 10/20 to assess current dose and to r/o  accumulation  Continue zosyn 3.375gm IV q8h over 4h infusion  F/u renal function and cultures  Dia Sitter, PharmD, BCPS 05/06/2015 11:28 AM

## 2015-05-07 ENCOUNTER — Inpatient Hospital Stay (HOSPITAL_COMMUNITY): Payer: Medicaid Other

## 2015-05-07 DIAGNOSIS — K709 Alcoholic liver disease, unspecified: Secondary | ICD-10-CM

## 2015-05-07 DIAGNOSIS — F1910 Other psychoactive substance abuse, uncomplicated: Secondary | ICD-10-CM

## 2015-05-07 DIAGNOSIS — R7989 Other specified abnormal findings of blood chemistry: Secondary | ICD-10-CM

## 2015-05-07 LAB — PROTIME-INR
INR: 1.41 (ref 0.00–1.49)
Prothrombin Time: 17.3 seconds — ABNORMAL HIGH (ref 11.6–15.2)

## 2015-05-07 LAB — COMPREHENSIVE METABOLIC PANEL
ALK PHOS: 197 U/L — AB (ref 38–126)
ALT: 461 U/L — ABNORMAL HIGH (ref 17–63)
ANION GAP: 7 (ref 5–15)
AST: 561 U/L — ABNORMAL HIGH (ref 15–41)
Albumin: 2.4 g/dL — ABNORMAL LOW (ref 3.5–5.0)
BILIRUBIN TOTAL: 3.2 mg/dL — AB (ref 0.3–1.2)
BUN: 32 mg/dL — ABNORMAL HIGH (ref 6–20)
CALCIUM: 8.1 mg/dL — AB (ref 8.9–10.3)
CO2: 19 mmol/L — ABNORMAL LOW (ref 22–32)
Chloride: 108 mmol/L (ref 101–111)
Creatinine, Ser: 2.19 mg/dL — ABNORMAL HIGH (ref 0.61–1.24)
GFR calc non Af Amer: 31 mL/min — ABNORMAL LOW (ref >60)
GFR, EST AFRICAN AMERICAN: 36 mL/min — AB (ref >60)
Glucose, Bld: 83 mg/dL (ref 65–99)
POTASSIUM: 4.2 mmol/L (ref 3.5–5.1)
Sodium: 134 mmol/L — ABNORMAL LOW (ref 135–145)
TOTAL PROTEIN: 4.5 g/dL — AB (ref 6.5–8.1)

## 2015-05-07 LAB — GLUCOSE, CAPILLARY
GLUCOSE-CAPILLARY: 35 mg/dL — AB (ref 65–99)
GLUCOSE-CAPILLARY: 123 mg/dL — AB (ref 65–99)
Glucose-Capillary: 121 mg/dL — ABNORMAL HIGH (ref 65–99)

## 2015-05-07 LAB — VANCOMYCIN, TROUGH

## 2015-05-07 LAB — LACTIC ACID, PLASMA: Lactic Acid, Venous: 2.6 mmol/L (ref 0.5–2.0)

## 2015-05-07 LAB — HEPATITIS PANEL, ACUTE
HEP A IGM: NEGATIVE
HEP B S AG: NEGATIVE
Hep B C IgM: NEGATIVE

## 2015-05-07 LAB — PROCALCITONIN: Procalcitonin: 1.86 ng/mL

## 2015-05-07 LAB — BRAIN NATRIURETIC PEPTIDE: B Natriuretic Peptide: 1402.2 pg/mL — ABNORMAL HIGH (ref 0.0–100.0)

## 2015-05-07 MED ORDER — SODIUM CHLORIDE 0.9 % IV BOLUS (SEPSIS)
250.0000 mL | Freq: Once | INTRAVENOUS | Status: DC
Start: 2015-05-07 — End: 2015-05-13

## 2015-05-07 MED ORDER — SODIUM CHLORIDE 0.9 % IV BOLUS (SEPSIS)
250.0000 mL | Freq: Once | INTRAVENOUS | Status: AC
  Administered 2015-05-07: 250 mL via INTRAVENOUS

## 2015-05-07 MED ORDER — VANCOMYCIN HCL IN DEXTROSE 1-5 GM/200ML-% IV SOLN
1000.0000 mg | INTRAVENOUS | Status: DC
  Administered 2015-05-07 – 2015-05-08 (×2): 1000 mg via INTRAVENOUS
  Filled 2015-05-07 (×3): qty 200

## 2015-05-07 MED ORDER — DEXTROSE 50 % IV SOLN
50.0000 mL | Freq: Once | INTRAVENOUS | Status: AC
  Administered 2015-05-07: 50 mL via INTRAVENOUS

## 2015-05-07 MED ORDER — TECHNETIUM TC 99M MEBROFENIN IV KIT
5.0000 | PACK | Freq: Once | INTRAVENOUS | Status: DC | PRN
  Administered 2015-05-07: 7.3 via INTRAVENOUS
  Filled 2015-05-07: qty 6

## 2015-05-07 NOTE — Progress Notes (Signed)
Pt Lactic acid 2.6  Notified MD.

## 2015-05-07 NOTE — Progress Notes (Addendum)
Progress Note   [name] WUJ:[id number] DOB: [birth year] DOA: 05/03/2015 PCP: Charolette Forward, MD   Brief Narrative:   [name] is an 59 y.o. male with a PMH of hypertension, chronic combined CHF, CAD, NSTEMI (on aspirin and plavix), CKD stage 3 who was admitted 05/03/15 with chief complaint of gradually worsening neck swelling. In ED, pt was hemodynamically stable. BP was 91/61, HR 92. Blood work revealed WBC count of 16.8, creatinine 2.26 (baseline 8 months ago 1.9). CT renal stone study showed no hydronephrosis or urolithiasis. LFT's were as follows: AST 299, ALT 283, TB 3.9. His recent abd [place] 04/30/15 showed mild gallbladder wall thickening probably attributable to nondistention/contraction. No gallstones. Other imaging studies included, CXR which showed no acute findings. X ray of the neck showed no acute findings. CT neck showed asymmetric enlargement of the right submandibular gland relative to the left, nonspecific but could be due to infection, inflammation or possibly tumor.   Assessment/Plan:   Principal Problem:   Sepsis due to cellulitis [facility]) with sepsis associated hypotension, neck swelling and leukocytosis - Sepsis criteria met 05/04/15 with hypotension, fever, tachycardia, leukocytosis and lactic acidosis. - Source of infection felt to be from neck cellulitis.  - Pt was given clindamycin on admission but this was changed to broad spectrum abx, vanco and zosyn for sepsis. - CT neck on admission demonstrated asymmetric enlargement of the right submandibular gland relative to the left. - MRI of the neck 05/04/15 consistent with cellulitis. No evidence of salivary gland mass or other abnormalities. - Blood cultures negative to date. Pro calcitonin elevation consistent with infection.   Active Problems:  Acute renal failure superimposed on stage 3 chronic kidney disease (HCC) - Baseline creatinine about 8 months ago 1.9. - Creatinine on this admission 2.26,  possibly from lasix and lisinopril and both of those meds remain on hold. - CT renal stone protocol showed no hydronephrosis or urolithiasis.  - Provided with gentle hydration. Creatinine slowly trending back down and is almost back to baseline values.   Chronic combined systolic (congestive) and diastolic (congestive) heart failure (HCC) - Last 2 D ECHO in 12/2014 with EF of 20%. - Compensated.    Dysphagia - Evaluated by speech therapy 05/04/15: Dysphagia 3 diet recommended.  Oral care TID.  MMW ordered. - Chest x-ray clear on admission.    Benign essential HTN - BP low in the setting of sepsis, so BP meds on hold: coreg, ramipril, imdur, lasix.   History of non-ST elevation myocardial infarction (NSTEMI) - Stable, no reports of chest pain.  - Sinus rhythm on 12 lead EKG. - Continue aspirin and plavix. - Continue statin therapy.   Hyponatremia - Likely due to CHF etiology. Sodium stable at 132.   Abnormal LFTs - Recent US done 10/13 showed no acute findings, no gallstones seen. - LFTs worse today. HIDA scan & acute hepatitis serologies negative.  Check PT.  Will get GI consult. - Hold Lipitor. - Check ANA, Anti-mitochondrial antibodies, Anti-smooth muscle antibodies, and liver/kidney microsomal antibodies &  IgG levels if PT abnormal..    Polysubstance abuse / Noncompliance / Homelessness - SW consulted.  - UDS and alcohol level WNL    DVT Prophylaxis  - SCD's bilaterally, aspirin, plavix.   Code Status: Full.  Family Communication: No family at the bedside. Disposition Plan: Home once source of persistent elevation of LFTs elucidated, SW to assist with disposition given homelessness.  IV Access:    Peripheral IV   Procedures and  diagnostic studies:   Dg Neck Soft Tissue  05/03/2015  CLINICAL DATA:  Neck pain and swelling. EXAM: NECK SOFT TISSUES - 1+ VIEW COMPARISON:  None. FINDINGS: There is no evidence of retropharyngeal soft tissue swelling or  epiglottic enlargement. The cervical airway is unremarkable and no radio-opaque foreign body identified. Cervical spine degenerative changes noted. IMPRESSION: No acute findings. Electronically Signed   By: Earle Gell M.D.   On: 05/03/2015 11:06   Dg Chest 2 View  05/03/2015  CLINICAL DATA:  Pt c/o productive cough, SOB and weakness x 2days. HTN, former smoker (quit in 2005). Hx of MI and pneumonia EXAM: CHEST  2 VIEW COMPARISON:  Chest radiograph of 04/30/2015 FINDINGS: Midline trachea. Normal heart size and mediastinal contours. No pleural effusion or pneumothorax. Clear lungs. IMPRESSION: No acute cardiopulmonary disease. Electronically Signed   By: Abigail Miyamoto M.D.   On: 05/03/2015 11:06   Ct Soft Tissue Neck Wo Contrast  05/03/2015  CLINICAL DATA:  Throat swelling and productive cough, worsening. Facial swelling. EXAM: CT NECK WITHOUT CONTRAST TECHNIQUE: Multidetector CT imaging of the neck was performed following the standard protocol without intravenous contrast. COMPARISON:  None. FINDINGS: Lack of IV contrast material limits the exam. There is diffuse infiltration of subcutaneous fatty tissues which may be due to edema or less likely cellulitis. The right submandibular gland is asymmetrically enlarged relative to the left with subtle low attenuation within it and mild haziness of its borders. Major salivary glands are otherwise unremarkable. No lymphadenopathy is seen. No abscess is identified. The lung apices are clear. No lytic or sclerotic bony lesion is seen. Cervical spondylosis is noted. IMPRESSION: Asymmetric enlargement of the right submandibular gland relative to the left is nonspecific but could be due to infection, inflammation or possibly tumor. MRI of the neck with contrast if possible after the patient's acute episode has passed is recommended for further evaluation. Negative for abscess. Diffuse subcutaneous edema is nonspecific and likely due to anasarca rather than cellulitis.  Electronically Signed   By: Inge Rise M.D.   On: 05/03/2015 13:18   Mr Neck Soft Tissue Only W Wo Contrast  05/04/2015  CLINICAL DATA:  Initial evaluation for throat and facial swelling, productive cough, recently worsened. Patient also with leukocytosis and concern for cellulitis of neck. Evaluate submandibular gland which was question to be asymmetrically enlarged on prior CT. EXAM: MR NECK SOFT TISSUE ONLY WITHOUT AND WITH CONTRAST TECHNIQUE: Multiplanar, multisequence MR imaging was performed both before and after administration of intravenous contrast. CONTRAST:  34m MULTIHANCE GADOBENATE DIMEGLUMINE 529 MG/ML IV SOLN COMPARISON:  Prior neck CT from 05/03/2015. FINDINGS: Study is degraded by motion artifact. Visualized portions of the brain demonstrate no acute abnormality. Mucosal thickening within the visualized paranasal sinuses. No air-fluid levels to suggest active sinus infection. Mastoid air cells are grossly clear. The salivary glands including the parotid glands and submandibular glands are relatively symmetric and normal in appearance bilaterally. On today's study, the submandibular glands are fairly symmetric in appearance bilaterally, although the right may be slightly larger than the left, felt to be within normal limits for normal size variation. No asymmetric enhancement or focal mass lesion identified within the right submandibular gland to suggest underlying neoplasm. The submandibular glands enhance in a symmetric fashion bilaterally. Scattered edema involving primarily the submandibular spaces bilaterally, similar to findings seen on prior CT. Given the provided history, finding is concerning for possible cellulitis. No drainable fluid collection or abscess. This is better appreciated on prior CT. No  significant enhancement. Oral cavity demonstrates a normal appearance. Palatine tonsils normal. Parapharyngeal fat well-preserved. Nasopharynx and oropharynx within normal limits.  Epiglottis is normal. Vallecula is clear. Hypopharynx and supraglottic larynx demonstrate no acute abnormality. Thyroid gland grossly unremarkable. No pathologically enlarged lymph nodes identified within the neck. There are 3 mildly prominent level 1A submental nodes measuring up to 7 mm, likely reactive. Mildly prominent left level 2 lymph nodes measure at the upper limits of normal at approximately 1 cm in short axis. Partially visualized superior mediastinum demonstrates no acute abnormality. Partially visualized lungs are grossly clear. Mild multilevel degenerative changes noted within the visualized cervical spine. Marrow signal intensity within normal limits. Signal intensity within the visualized cord is normal. IMPRESSION: 1. Motion degraded study with no discrete salivary gland mass or other abnormality identified. 2. Grossly similar subcutaneous edema involving primarily the bilateral submandibular spaces. Again, while this finding is nonspecific, possible infection could have this appearance given the history of leukocytosis and concern for cellulitis. This is better evaluated on prior CT. No discrete abscess. Electronically Signed   By: Jeannine Boga M.D.   On: 05/04/2015 22:42   Ct Renal Stone Study  05/03/2015  CLINICAL DATA:  Mid abdominal pain. Elevated liver function tests. Renal failure. EXAM: CT ABDOMEN AND PELVIS WITHOUT CONTRAST TECHNIQUE: Multidetector CT imaging of the abdomen and pelvis was performed following the standard protocol without IV contrast. COMPARISON:  04/23/2015 FINDINGS: Lower chest:  No acute findings. Hepatobiliary: No mass visualized on this un-enhanced exam. Gallbladder is unremarkable. No evidence of biliary ductal dilatation on this unenhanced exam. Pancreas: No mass or inflammatory process identified on this un-enhanced exam. Spleen: Within normal limits in size. Adrenals/Urinary Tract: No evidence of urolithiasis or hydronephrosis. Mild bilateral perinephric  stranding is nonspecific and stable in appearance since previous study. Stomach/Bowel: No evidence of obstruction, inflammatory process, or abnormal fluid collections. Normal appendix visualized. Vascular/Lymphatic: No pathologically enlarged lymph nodes. No evidence of abdominal aortic aneurysm. Reproductive: Stable moderately enlarged prostate Other: None. Musculoskeletal:  No suspicious bone lesions identified. IMPRESSION: No evidence of urolithiasis, hydronephrosis, or other acute findings. Stable moderately enlarged prostate. Electronically Signed   By: Earle Gell M.D.   On: 05/03/2015 13:13     Medical Consultants:    None.  Anti-Infectives:   Anti-infectives    Start     Dose/Rate Route Frequency Ordered Stop   05/07/15 0700  vancomycin (VANCOCIN) IVPB 1000 mg/200 mL premix     1,000 mg 200 mL/hr over 60 Minutes Intravenous Every 24 hours 05/07/15 0611     05/04/15 1100  piperacillin-tazobactam (ZOSYN) IVPB 3.375 g     3.375 g 12.5 mL/hr over 240 Minutes Intravenous 3 times per day 05/04/15 1003     05/04/15 0600  vancomycin (VANCOCIN) IVPB 750 mg/150 ml premix  Status:  Discontinued     750 mg 150 mL/hr over 60 Minutes Intravenous Every 24 hours 05/04/15 0557 05/07/15 0610   05/03/15 2200  clindamycin (CLEOCIN) IVPB 600 mg  Status:  Discontinued     600 mg 100 mL/hr over 30 Minutes Intravenous 3 times per day 05/03/15 1524 05/04/15 0225   05/03/15 1330  clindamycin (CLEOCIN) IVPB 600 mg     600 mg 100 mL/hr over 30 Minutes Intravenous  Once 05/03/15 1326 05/03/15 1457      Subjective:   Forde Radon reports ongoing swelling in left hand and around foreskin of penis.  No significant SOB.  Occasional abdominal discomfort.  Objective:    Filed Vitals:  05/06/15 1439 05/06/15 2055 05/07/15 0425 05/07/15 0458  BP: _0   Pulse:  86 87   Temp: 99 F (37.2 C) 98.4 F (36.9 C) 101.2 F (38.4 C)   TempSrc: Oral Oral Oral   Resp: _1 Height:       Weight:    75 kg (165 lb 5.5 oz)  SpO2: 100% 100% 100%     Intake/Output Summary (Last 24 hours) at 05/07/15 0753 Last data filed at 05/06/15 2300  Gross per 24 hour  Intake    773 ml  Output   1375 ml  Net   -602 ml   Filed Weights   05/05/15 0523 05/06/15 0500 05/07/15 0458  Weight: 75.3 kg (166 lb 0.1 oz) 80.6 kg (177 lb 11.1 oz) 75 kg (165 lb 5.5 oz)    Exam: Gen:  NAD Neck: Swelling improved. Cardiovascular:  RRR, No M/R/G Respiratory:  Lungs CTAB Gastrointestinal:  Abdomen soft, NT/ND, + BS GU: Swelling around foreskin Extremities:  Left hand swollen, no swelling BLE   Data Reviewed:    Labs: Basic Metabolic Panel:  Recent Labs Lab 05/03/15 1542 05/04/15 0255 05/05/15 0525 05/06/15 0519 05/07/15 0457  NA 131* 131* 132* 132* 134*  K 4.4 4.9 4.2 4.3 4.2  CL 103 104 103 105 108  CO2 20* 20* 18* 17* 19*  GLUCOSE 98 136* 88 69 83  BUN 45* 50* 38* 33* 32*  CREATININE 2.31* 2.36* 2.05* 2.10* 2.19*  CALCIUM 8.0* 7.9* 8.3* 8.0* 8.1*  MG 1.9  --   --   --   --   PHOS 3.8  --   --   --   --    GFR Estimated Creatinine Clearance: 34.3 mL/min (by C-G formula based on Cr of 2.19). Liver Function Tests:  Recent Labs Lab 05/03/15 1009 05/03/15 1542 05/04/15 0255 05/06/15 0519 05/07/15 0457  AST 299* 226* 195* 547* 561*  ALT 283* 229* 218* 419* 461*  ALKPHOS 104 78 93 155* 197*  BILITOT 3.9* 3.0* 2.7* 3.0* 3.2*  PROT 6.9 5.5* 5.0* 4.7* 4.5*  ALBUMIN 3.7 3.0* 2.8* 2.5* 2.4*    Recent Labs Lab 04/30/15 1724 05/03/15 1009  LIPASE 15* 16*   CBC:  Recent Labs Lab 04/30/15 1459 05/03/15 1009 05/03/15 1542 05/04/15 0255 05/05/15 0525 05/06/15 0519  WBC 7.1 16.8* 17.4* 16.2* 15.4* 19.9*  NEUTROABS 4.2 10.4* 11.4*  --   --   --   HGB 14.9 17.8* 14.4 13.4 14.1 12.2*  HCT 43.4 51.4 41.7 39.3 42.1 35.6*  MCV 100.2* 99.8 100.2* 99.7 99.5 98.6  PLT 212 253 230 225 209 199   BNP (last 3 results)  Recent Labs  04/13/15 1012  PROBNP 3040.00*    CBG:  Recent Labs Lab 05/04/15 0719 05/05/15 0738 05/05/15 0810 05/06/15 0740  GLUCAP 112* 66 75 45*   Thyroid function studies: No results for input(s): TSH, T4TOTAL, T3FREE, THYROIDAB in the last 72 hours.  Invalid input(s): FREET3 Sepsis Labs:  Recent Labs Lab 05/03/15 1542 05/04/15 0255 05/04/15 0541 05/04/15 1130 05/04/15 1400 05/05/15 0525 05/06/15 0519  PROCALCITON  --   --   --  3.23  --   --   --   WBC 17.4* 16.2*  --   --   --  15.4* 19.9*  LATICACIDVEN  --  2.7* 2.9* 3.6* 3.6*  --   --    Microbiology Recent Results (from the past 240 hour(s))  Culture, blood (routine x  2)     Status: None (Preliminary result)   Collection Time: 05/04/15  2:50 AM  Result Value Ref Range Status   Specimen Description BLOOD RIGHT ARM  Final   Special Requests BOTTLES DRAWN AEROBIC AND ANAEROBIC [place]  Final   Culture   Final    NO GROWTH 2 DAYS Performed at [facility]    Report Status PENDING  Incomplete  Culture, blood (routine x 2)     Status: None (Preliminary result)   Collection Time: 05/04/15  2:55 AM  Result Value Ref Range Status   Specimen Description BLOOD RIGHT HAND  Final   Special Requests BOTTLES DRAWN AEROBIC ONLY 10CC  Final   Culture   Final    NO GROWTH 2 DAYS Performed at [facility]    Report Status PENDING  Incomplete  Culture, Urine     Status: None   Collection Time: 05/04/15 10:57 PM  Result Value Ref Range Status   Specimen Description URINE, RANDOM  Final   Special Requests NONE  Final   Culture   Final    NO GROWTH 1 DAY Performed at [facility]    Report Status 05/06/2015 FINAL  Final     Medications:   . aspirin EC  81 mg Oral Daily  . carvedilol  3.125 mg Oral BID WC  . clopidogrel  75 mg Oral Daily  . diphenhydrAMINE  25 mg Oral Q6H  . famotidine  20 mg Oral Daily  . magic mouthwash  5 mL Oral QID  . mirabegron ER  25 mg Oral Daily  . piperacillin-tazobactam (ZOSYN)  IV  3.375 g Intravenous 3  times per day  . sodium chloride  3 mL Intravenous Q12H  . vancomycin  1,000 mg Intravenous Q24H   Continuous Infusions:    Time spent: 35 minutes with > 50% of time discussing current diagnostic test results, clinical impression and plan of care, coordination of care with GI to evaluate rising LFTs.    LOS: 3 days   [facility] Pager [phone]. If unable to reach me by pager, please call my cell phone at [phone].  *Please refer to [email or web address], password [email or web address] to get updated schedule on who will round on this patient, as hospitalists switch teams weekly. If 7PM-7AM, please contact night-coverage at [email or web address], password [email or web address] for any overnight needs.  05/07/2015, 7:53 AM

## 2015-05-07 NOTE — Consult Note (Signed)
Referring Provider: [facility] Primary Care Physician:  Charolette Forward, MD Primary Gastroenterologist:  unassigned  Reason for Consultation:   Abnormal LFTs  HPI: [name] is a 59 y.o. male with a history of ischemic dilated cardiomyopathy, acute systolic CHF (EF 0000000), hypertension, gout, CAD, NSTEMI (on plavix and ASA), depression, arthritis, anxiety, polysubstance abuse ( EtOH and cocaine), and homelessness who was admitted on October 16 with complaints of neck swelling. Patient had been seen in the emergency room October 13 with a three-day history of a pruritic rash with "hives all over my body". He also had a cough and sore throat and some epigastric pain. He received 50 mg of Benadryl from EMS with improvement of the rash. He was also given prednisone and Zantac in the ER. At that visit he complained of a week's worth of fatigue. Labs showed an elevated AST to 169 ALT130, bilirubin 2 which reportedly were all increased from previous blood work. Patient denied a prior history of liver disease. He reports that he has used intravenous drugs in the remote past but not recently. He has used cocaine intermittently. He denies use of Tylenol. He had an abdominal ultrasound that showed contracted gallbladder with thickening likely secondary to this. It is no right upper quadrant fluid and no signs of biliary ductal dilatation. Patient was advised to follow up with his PCP regarding his abnormal liver enzymes. He presented to the emergency room on the 16th with Swallowing that he says has become progressively worse over several days prior to admission. He had no associated stridor with respiratory distress. Repeat LFTs showed an AST 299, ALT 283, total bili 3.9. X-ray of the neck had no acute findings but CT showed asymmetric enlargement of the right submandibular gland was nonspecific but could possibly due to inflammation, infection, or tumor. He was admitted and treated empirically for  cellulitis. He was also noted to have an exacerbation of his chronic kidney disease with a admission creatinine of 2.26 up from 1.98 months ago. On the 17th his LFTs were trending down, but on the 19th he was noted to have a total bili of 3, ALT 419, AST 547, and alkaline phosphatase 155. Earlier today he was noted to have total bili 3.2, ALT 461, AST 561, alkaline phosphatase 197. UDS and alcohol level on admission were normal. Hepatitis A, B, and C serologies were nonrevealing.HIDA scan showed normal filling of the gallbladder with radiotracer, indicating cystic duct patency, which is not consistent with acute cholecystitis. Prompt radiotracer excretion into the common bile duct and small bowel, with no evidence of biliary obstruction. Normal liver radiotracer uptake and excretion.  Patient reports that he had been drinking prior to admission he has several beers daily and vodka. He reports that he had been having some epigastric and right upper quadrant pain for several days prior to admission. This was associated with nausea which tended to be worse on an empty stomach, and occasional vomiting. He had no radiation of this pain into his back. He has been having heartburn for 5 days a week for months for which he will use Rolaids. He is unaware of family history of gallbladder disease. He denied use of herbal supplements. Patient also reported that he was having some difficulty swallowing foods and frequently had to spit up secretions. He felt that food was sometimes getting stuck in the mid chest. He was evaluated by speech and language pathology on October 17, and it was noted that after intake of crackers applesauce  juice a seated had some coughing. He does not feel as if food was pooling in his throat. He was advised to adhere to aspiration precautions.   Past Medical History  Diagnosis Date  . Hypertension   . Homelessness   . Urinary hesitancy   . Hypercholesterolemia   . NSTEMI (non-ST elevated  myocardial infarction) ([place]) 01/07/2015  . Heart attack ([place]) 07/2014  . Walking pneumonia 07/2014  . Headache     "probably weekly" (01/07/2015)  . Arthritis     "all over" (01/07/2015)  . Anxiety   . Depression   . Noncompliance   . Polysubstance abuse     etoh, cocaine  . Ischemic dilated cardiomyopathy   . CKD (chronic kidney disease), stage IV ([employer])   . History of echocardiogram 12/2014    EF 20-25%    Past Surgical History  Procedure Laterality Date  . Left heart catheterization with coronary angiogram N/A 08/15/2014    Procedure: LEFT HEART CATHETERIZATION WITH CORONARY ANGIOGRAM;  Surgeon: Clent Demark, MD;  Location: [facility] CATH LAB;  Service: Cardiovascular;  Laterality: N/A;  . Cardiac catheterization      Prior to Admission medications   Medication Sig Start Date End Date Taking? Authorizing Provider  aspirin EC 81 MG tablet Take 81 mg by mouth daily.   Yes Historical Provider, MD  atorvastatin (LIPITOR) 80 MG tablet Take 80 mg by mouth daily.   Yes Historical Provider, MD  carvedilol (COREG) 3.125 MG tablet Take 1 tablet (3.125 mg total) by mouth 2 (two) times daily with a meal. Discontinue 6.25mg  03/24/15  Yes [name], MD  clopidogrel (PLAVIX) 75 MG tablet Take 1 tablet (75 mg total) by mouth daily. 01/09/15  Yes Charolette Forward, MD  colchicine 0.6 MG tablet Take 2 tabs (1.2mg ) PO at the onset of a gout attack, may repeat 1 tab(0.6mg ) an hour later, max 1.8mg  Patient taking differently: Take 1.2 mg by mouth daily as needed (gout). Take 2 tabs (1.2mg ) PO at the onset of a gout attack, may repeat 1 tab(0.6mg ) an hour later, max 1.8mg  03/24/15  Yes [name], MD  diphenhydrAMINE (BENADRYL) 25 MG tablet Take 1 tablet (25 mg total) by mouth every 6 (six) hours. 04/30/15 05/03/15 Yes Gareth Morgan, MD  furosemide (LASIX) 40 MG tablet Take 1.5 tablets (60 mg total) by mouth 2 (two) times daily. 03/31/15  Yes [name], MD  isosorbide mononitrate (IMDUR) 30 MG 24 hr tablet Take  15-30 mg by mouth daily.    Yes Historical Provider, MD  isosorbide-hydrALAZINE (BIDIL) 20-37.5 MG per tablet Take 1 tablet by mouth 2 (two) times daily. 03/10/15  Yes Charolette Forward, MD  mirabegron ER (MYRBETRIQ) 25 MG TB24 tablet Take 25 mg by mouth daily.   Yes Historical Provider, MD  nitroGLYCERIN (NITROSTAT) 0.4 MG SL tablet Place 1 tablet (0.4 mg total) under the tongue every 5 (five) minutes x 3 doses as needed for chest pain. 08/16/14  Yes Dixie Dials, MD  ramipril (ALTACE) 1.25 MG capsule Take 1.25 mg by mouth daily.   Yes Historical Provider, MD  ranitidine (ZANTAC) 150 MG capsule Take 1 capsule (150 mg total) by mouth 2 (two) times daily. 04/30/15  Yes Gareth Morgan, MD  traMADol (ULTRAM) 50 MG tablet Take 1 tablet (50 mg total) by mouth every 12 (twelve) hours as needed. Patient taking differently: Take 50 mg by mouth every 12 (twelve) hours as needed for moderate pain.  03/31/15  Yes [name], MD  allopurinol (ZYLOPRIM) 300  MG tablet Take 1 tablet (300 mg total) by mouth daily. Patient not taking: Reported on 05/03/2015 03/24/15   [name], MD  atorvastatin (LIPITOR) 20 MG tablet Take 1 tablet (20 mg total) by mouth daily. Patient not taking: Reported on 04/30/2015 09/07/14   Charolette Forward, MD  digoxin (LANOXIN) 0.25 MG tablet Take 1 tablet (0.25 mg total) by mouth daily. Patient not taking: Reported on 05/03/2015 09/07/14   Charolette Forward, MD    Current Facility-Administered Medications  Medication Dose Route Frequency Provider Last Rate Last Dose  . acetaminophen (TYLENOL) tablet 650 mg  650 mg Oral Q6H PRN Robbie Lis, MD   650 mg at 05/04/15 2309   Or  . acetaminophen (TYLENOL) suppository 650 mg  650 mg Rectal Q6H PRN Robbie Lis, MD      . aspirin EC tablet 81 mg  81 mg Oral Daily Robbie Lis, MD   81 mg at 05/07/15 1033  . carvedilol (COREG) tablet 3.125 mg  3.125 mg Oral BID WC Robbie Lis, MD   3.125 mg at 05/07/15 1033  . clopidogrel (PLAVIX) tablet 75 mg  75  mg Oral Daily Robbie Lis, MD   75 mg at 05/07/15 1033  . colchicine tablet 1.2 mg  1.2 mg Oral Daily PRN Robbie Lis, MD      . diphenhydrAMINE (BENADRYL) capsule 25 mg  25 mg Oral Q6H Robbie Lis, MD   25 mg at 05/06/15 1828  . famotidine (PEPCID) tablet 20 mg  20 mg Oral Daily Robbie Lis, MD   20 mg at 05/07/15 1033  . magic mouthwash  5 mL Oral QID Venetia Maxon Rama, MD   5 mL at 05/07/15 1034  . mirabegron ER (MYRBETRIQ) tablet 25 mg  25 mg Oral Daily Robbie Lis, MD   25 mg at 05/07/15 1033  . ondansetron (ZOFRAN) tablet 4 mg  4 mg Oral Q6H PRN Robbie Lis, MD       Or  . ondansetron [facility]) injection 4 mg  4 mg Intravenous Q6H PRN Robbie Lis, MD   4 mg at 05/07/15 1030  . piperacillin-tazobactam (ZOSYN) IVPB 3.375 g  3.375 g Intravenous 3 times per day Berton Mount, RPH   3.375 g at 05/07/15 1049  . sodium chloride 0.9 % injection 3 mL  3 mL Intravenous Q12H Robbie Lis, MD   3 mL at 05/06/15 2106  . technetium TC 24M mebrofenin (CHOLETEC) injection 5 milli Curie  5 milli Curie Intravenous Once PRN Medication Radiologist, MD   7.3 milli Curie at 05/07/15 0730  . traMADol (ULTRAM) tablet 50 mg  50 mg Oral Q12H PRN Robbie Lis, MD      . vancomycin (VANCOCIN) IVPB 1000 mg/200 mL premix  1,000 mg Intravenous Q24H Nilda Simmer, RPH   1,000 mg at 05/07/15 1046    Allergies as of 05/03/2015  . (No Known Allergies)    History reviewed. No pertinent family history.  Social History   Social History  . Marital Status: Single    Spouse Name: N/A  . Number of Children: N/A  . Years of Education: N/A   Occupational History  . Not on file.   Social History Main Topics  . Smoking status: Former Smoker -- 1.50 packs/day for 30 years    Types: Cigarettes    Quit date: 02/19/2004  . Smokeless tobacco: Never Used  . Alcohol Use: Yes  Comment: social  . Drug Use: No     Comment: former  . Sexual Activity: No     Comment: crack   Other Topics Concern    . Not on file   Social History Narrative    Review of Systems: Gen: Denies any fever, chills, sweats, anorexia, fatigue, weakness, malaise, weight loss, and sleep disorder CV: Denies chest pain, angina, palpitations, syncope, orthopnea, PND, peripheral edema, and claudication. Resp: Denies dyspnea at rest, dyspnea with exercise, cough, sputum, wheezing, coughing up blood, and pleurisy. GI: Denies vomiting blood, jaundice, and fecal incontinence.  Occasional dysphagia to solids. Reports intermittent epigastric pain and nausea. GU : Denies urinary burning, blood in urine, urinary frequency, urinary hesitancy, nocturnal urination, and urinary incontinence. MS: Denies joint pain, limitation of movement, and swelling, stiffness, low back pain, extremity pain. Denies muscle weakness, cramps, atrophy.  Derm: Has had itching and hives over the past week. Psych: Admits to a history of depression and anxiety. Heme: Denies bruising, bleeding, and enlarged lymph nodes. Neuro:  Denies any  dizziness, paresthesias. Reports frequent headaches Endo:  Denies any problems with DM, thyroid, adrenal function.  Physical Exam: Vital signs in last 24 hours: Temp:  [98.4 F (36.9 C)-101.2 F (38.4 C)] 101.2 F (38.4 C) (10/20 0425) Pulse Rate:  [86-87] 87 (10/20 0425) Resp:  [16-24] 24 (10/20 0425) BP: (90-93)/(49-61) 93/53 mmHg (10/20 0425) SpO2:  [100 %] 100 % (10/20 0425) Weight:  [165 lb 5.5 oz (75 kg)] 165 lb 5.5 oz (75 kg) (10/20 0458) Last BM Date: 05/06/15 General:   Alert,  Well-developed, well-nourished, pleasant and cooperative in NAD Head:  Normocephalic and atraumatic. Eyes:  Sclera clear, no icterus.   Conjunctiva pink. Ears:  Normal auditory acuity. Nose:  No deformity, discharge,  or lesions. Mouth:  No deformity or lesions.   Neck:  Supple; no masses or thyromegaly. Lungs:  Clear throughout to auscultation.   No wheezes, crackles, or rhonchi.  Heart:  Regular rate and rhythm; no  murmurs, clicks, rubs,  or gallops. Abdomen:  Soft,nontender, BS active,nonpalp mass or hsm.   Rectal:  Deferred  Msk:  Symmetrical without gross deformities. . Pulses:  Normal pulses noted. Extremities: Without clubbing or edema. Neurologic:  Alert and  oriented x4;  grossly normal neurologically. Skin:  Intact without significant lesions or rashes.. Psych: Alert and cooperative. Normal mood and affect.  Intake/Output from previous day: 10/19 0701 - 10/20 0700 In: 773 [P.O.:720; I.V.:3; IV Piggyback:50] Out: [id number] [Urine:1375] Intake/Output this shift: Total I/O In: 240 [P.O.:240] Out: 300 [Urine:300]  Lab Results:  Recent Labs  05/05/15 0525 05/06/15 0519  WBC 15.4* 19.9*  HGB 14.1 12.2*  HCT 42.1 35.6*  PLT 209 199   BMET  Recent Labs  05/05/15 0525 05/06/15 0519 05/07/15 0457  NA 132* 132* 134*  K 4.2 4.3 4.2  CL 103 105 108  CO2 18* 17* 19*  GLUCOSE 88 69 83  BUN 38* 33* 32*  CREATININE 2.05* 2.10* 2.19*  CALCIUM 8.3* 8.0* 8.1*   LFT  Recent Labs  05/07/15 0457  PROT 4.5*  ALBUMIN 2.4*  AST 561*  ALT 461*  ALKPHOS 197*  BILITOT 3.2*   PT/INR  Recent Labs  05/07/15 1205  LABPROT 17.3*  INR 1.41   Hepatitis Panel  Recent Labs  05/06/15 1134  HEPBSAG Negative  HCVAB <0.1  HEPAIGM Negative  HEPBIGM Negative   Urine culture 05/04/15   Studies/Results: Nm Hepatobiliary Including Gb  05/07/2015  CLINICAL DATA:  59 year old  male in patient with 2 weeks of abdominal pain. Elevated liver function tests. EXAM: NUCLEAR MEDICINE HEPATOBILIARY IMAGING TECHNIQUE: Sequential images of the abdomen were obtained out to 60 minutes following intravenous administration of radiopharmaceutical. RADIOPHARMACEUTICALS:  7.3 mCi Tc-51m  Choletec IV COMPARISON:  05/03/2015 CT abdomen/ pelvis. FINDINGS: There is prompt radiotracer uptake by the liver with normal blood pool clearance. There is prompt radiotracer excretion into the common bile duct and small  bowel beginning at 5 minutes. There is filling of the gallbladder with radiotracer beginning at 30 minutes, indicating cystic duct patency. There is no evidence of a bile leak. IMPRESSION: 1. Normal filling of the gallbladder with radiotracer, indicating cystic duct patency, which is not consistent with acute cholecystitis. 2. Prompt radiotracer excretion into the common bile duct and small bowel, with no evidence of biliary obstruction. 3. Normal liver radiotracer uptake and excretion. Electronically Signed   By: Ilona Sorrel M.D.   On: 05/07/2015 09:00      Vitals     Height Weight BMI (Calculated)    5\' 8"  (1.727 m) 165 lb 5.5 oz (75 kg) 23.7      Interpretation Summary     CLINICAL DATA: Elevated liver function tests.  EXAM: US ABDOMEN LIMITED - RIGHT UPPER QUADRANT  COMPARISON: 04/23/2015  FINDINGS: Gallbladder:  Mildly thickened gallbladder wall at 3 mm but probably due to nondistention given the small caliber of the gallbladder. Sonographic Murphy sign indeterminate due to pain medication. No discrete gallstone identified.  Common bile duct:  Diameter: 3 mm  Liver:  No focal lesion identified. Within normal limits in parenchymal echogenicity.  IMPRESSION: 1. The mild gallbladder wall thickening is probably attributable 2 nondistention/contraction. No gallstones are observed. Sonographic Murphy's sign is technically indeterminate due to pain medication.   Electronically Signed  By: Van Clines M.D.  On: 04/30/2015 18:57     IMPRESSION/PLAN: 59 yo ale admitted with sepsis due to cellulitis, noted to have an upward trend of LFTs since hospitalization. Pt has had elevated LFTs prior to admission--likely secondary to ETOH. Current elevation likely secondary to sepsis, shock liver/hypotension.  Pt was initially on clinda on admission, but this was changed to Zosyn and vancomycin. INR remains normal, prothrombin time 17.3 today. Platelets  nl, acute hepatitis serologies negative. Abdominal ultrasound and HIDA scan nonrevealing. Abdominal MRI has been ordered earlier today. Would trend LFTs.   AKI/CKD--continue gentle hydration combined systolic and diastolic heart failure-EF 20% Hypertension History NSTEMI--on aspirin and Plavix Hyponatremia in setting of CHF Dysphagia. Patient was seen by speech pathology on October 17 and dysphagia 3 diet was recommended. Homelessness--SW has been consulted. Polysubstance abuse. Intermittent heartburn--currently on famotidine which he says is providing good relief.   Hvozdovic, Deloris Ping 05/07/2015,  Pager [phone]  Mon-Fri 8a-5p [phone] after 5p, weekends, holidays  GI ATTENDING  History, laboratories, x-rays all reviewed. Patient personally seen and examined. Agree with extensive history and physical examination as outlined above. Complicated patient with multiple significant medical problems. We're asked to see him for abnormal liver tests. He has abnormal liver test baseline in a pattern consistent with alcohol injury. Presents with sepsis and hypotension. Known to have congestive heart failure. Liver tests significantly elevated as outlined above. Imaging does not suggest obstructive process, but rather hepatocellular. HIDA negative. Acute hepatitis serologies negative. Patient looks well and his abdomen is benign. Most likely cause for his liver test abnormalities are secondary response to systemic infection and transient hypotension. Would expect improvement with time assuming that there are  no additional clinical insults or setbacks. No evidence that his liver is failing. Normal INR. We recommend that you continue to treat his primary problems and trend LFTs. If LFTs have not returned back to baseline (or close to it) by discharge, he should have outpatient liver tests with his PCP, Dr. Terrence Dupont. We're available for further assistant or questions. Thank you, will sign off.  Docia Chuck.  Geri Seminole., M.D. [facility] Division of Gastroenterology

## 2015-05-07 NOTE — Progress Notes (Signed)
BP 81/54 HR 81.  MD notified. Will continue to monitor.

## 2015-05-07 NOTE — Progress Notes (Signed)
CSW received referral for Current Substance abuse.  CSW attempted again today to meet with pt at bedside. Pt sleeping soundly at this time and did not awake to CSW entering room.   CSW to continue attempts to meet with pt to assess for Current Substance Abuse.   Alison Murray, MSW, [place] [employer] [phone]

## 2015-05-07 NOTE — Progress Notes (Signed)
ANTIBIOTIC CONSULT NOTE - FOLLOW UP  Pharmacy Consult for Vancomycin and zosyn Indication: neck Cellulitis, sepsis  No Known Allergies  Patient Measurements: Height: 5\' 8"  (172.7 cm) Weight: 165 lb 5.5 oz (75 kg) IBW/kg (Calculated) : 68.4  Vital Signs: Temp: 101.2 F (38.4 C) (10/20 0425) Temp Source: Oral (10/20 0425) BP: 93/53 mmHg (10/20 0425) Pulse Rate: 87 (10/20 0425) Intake/Output from previous day: 10/19 0701 - 10/20 0700 In: 773 [P.O.:720; I.V.:3; IV Piggyback:50] Out: 1375 [id number] Intake/Output from this shift: Total I/O In: 53 [I.V.:3; IV Piggyback:50] Out: 275 [Urine:275]  Labs:  Recent Labs  05/05/15 0525 05/06/15 0519 05/07/15 0457  WBC 15.4* 19.9*  --   HGB 14.1 12.2*  --   PLT 209 199  --   CREATININE 2.05* 2.10* 2.19*   Estimated Creatinine Clearance: 34.3 mL/min (by C-G formula based on Cr of 2.19).  Recent Labs  05/07/15 0457  Caban <4*     Microbiology: Recent Results (from the past 720 hour(s))  Culture, blood (routine x 2)     Status: None (Preliminary result)   Collection Time: 05/04/15  2:50 AM  Result Value Ref Range Status   Specimen Description BLOOD RIGHT ARM  Final   Special Requests BOTTLES DRAWN AEROBIC AND ANAEROBIC [place]  Final   Culture   Final    NO GROWTH 2 DAYS Performed at [facility]    Report Status PENDING  Incomplete  Culture, blood (routine x 2)     Status: None (Preliminary result)   Collection Time: 05/04/15  2:55 AM  Result Value Ref Range Status   Specimen Description BLOOD RIGHT HAND  Final   Special Requests BOTTLES DRAWN AEROBIC ONLY 10CC  Final   Culture   Final    NO GROWTH 2 DAYS Performed at [facility]    Report Status PENDING  Incomplete  Culture, Urine     Status: None   Collection Time: 05/04/15 10:57 PM  Result Value Ref Range Status   Specimen Description URINE, RANDOM  Final   Special Requests NONE  Final   Culture   Final    NO GROWTH 1 DAY Performed at  [facility]    Report Status 05/06/2015 FINAL  Final    Medical History: Past Medical History  Diagnosis Date  . Hypertension   . Homelessness   . Urinary hesitancy   . Hypercholesterolemia   . NSTEMI (non-ST elevated myocardial infarction) ([place]) 01/07/2015  . Heart attack ([place]) 07/2014  . Walking pneumonia 07/2014  . Headache     "probably weekly" (01/07/2015)  . Arthritis     "all over" (01/07/2015)  . Anxiety   . Depression   . Noncompliance   . Polysubstance abuse     etoh, cocaine  . Ischemic dilated cardiomyopathy   . CKD (chronic kidney disease), stage IV ([place])   . History of echocardiogram 12/2014    EF 20-25%    Medications:  Anti-infectives    Start     Dose/Rate Route Frequency Ordered Stop   05/07/15 0700  vancomycin (VANCOCIN) IVPB 1000 mg/200 mL premix     1,000 mg 200 mL/hr over 60 Minutes Intravenous Every 24 hours 05/07/15 0611     05/04/15 1100  piperacillin-tazobactam (ZOSYN) IVPB 3.375 g     3.375 g 12.5 mL/hr over 240 Minutes Intravenous 3 times per day 05/04/15 1003     05/04/15 0600  vancomycin (VANCOCIN) IVPB 750 mg/150 ml premix  Status:  Discontinued  750 mg 150 mL/hr over 60 Minutes Intravenous Every 24 hours 05/04/15 0557 05/07/15 0610   05/03/15 2200  clindamycin (CLEOCIN) IVPB 600 mg  Status:  Discontinued     600 mg 100 mL/hr over 30 Minutes Intravenous 3 times per day 05/03/15 1524 05/04/15 0225   05/03/15 1330  clindamycin (CLEOCIN) IVPB 600 mg     600 mg 100 mL/hr over 30 Minutes Intravenous  Once 05/03/15 1326 05/03/15 1457     Assessment: Patient's a 59 y.o [name] on vancomycin and zosyn for neck cellulitis/sepsis.  - afeb, wbc up 19.9, scr elevated at 2.19 but relatively stable today (crcl~35)  10/17 >> pip/tazo >> 10/17 >> vanco >>  10/17 bcx x2: pending 10/17 urine: neg FINAL  Trough Levels/dose changes 10/20 @ 04:57 = VT < 4 mcg/ml on 750mg  IV q24h --> inc to 1gm IV q24h   Goal of Therapy:  Vancomycin trough  level 10-15 mcg/ml  Plan:   Increase vancomyin to 1000mg  q24h.    Continue zosyn 3.375gm IV q8h over 4h infusion  F/u renal function and cultures  Leone Haven, PharmD 05/07/2015 6:14 AM

## 2015-05-07 NOTE — Progress Notes (Signed)
Hypoglycemic Event  CBG: 35  Treatment: D50 IV 50 mL  Symptoms: None  Follow-up CBG: Time:1023 CBG Result:1023  Possible Reasons for Event: Inadequate meal intake  Comments/MD notified:hypoglycemic protocol    Illa Level

## 2015-05-08 ENCOUNTER — Inpatient Hospital Stay (HOSPITAL_COMMUNITY): Payer: Medicaid Other

## 2015-05-08 DIAGNOSIS — E873 Alkalosis: Secondary | ICD-10-CM

## 2015-05-08 DIAGNOSIS — R740 Nonspecific elevation of levels of transaminase and lactic acid dehydrogenase [LDH]: Secondary | ICD-10-CM

## 2015-05-08 DIAGNOSIS — R220 Localized swelling, mass and lump, head: Secondary | ICD-10-CM

## 2015-05-08 DIAGNOSIS — D72829 Elevated white blood cell count, unspecified: Secondary | ICD-10-CM

## 2015-05-08 DIAGNOSIS — E872 Acidosis: Secondary | ICD-10-CM

## 2015-05-08 DIAGNOSIS — F1421 Cocaine dependence, in remission: Secondary | ICD-10-CM

## 2015-05-08 LAB — CBC
HEMATOCRIT: 31.3 % — AB (ref 39.0–52.0)
HEMOGLOBIN: 10.8 g/dL — AB (ref 13.0–17.0)
MCH: 34.8 pg — ABNORMAL HIGH (ref 26.0–34.0)
MCHC: 34.5 g/dL (ref 30.0–36.0)
MCV: 101 fL — AB (ref 78.0–100.0)
PLATELETS: 213 10*3/uL (ref 150–400)
RBC: 3.1 MIL/uL — AB (ref 4.22–5.81)
RDW: 14.3 % (ref 11.5–15.5)
WBC: 23.8 10*3/uL — AB (ref 4.0–10.5)

## 2015-05-08 LAB — COMPREHENSIVE METABOLIC PANEL
ALT: 460 U/L — ABNORMAL HIGH (ref 17–63)
AST: 517 U/L — ABNORMAL HIGH (ref 15–41)
Albumin: 2.5 g/dL — ABNORMAL LOW (ref 3.5–5.0)
Alkaline Phosphatase: 237 U/L — ABNORMAL HIGH (ref 38–126)
Anion gap: 7 (ref 5–15)
BUN: 33 mg/dL — ABNORMAL HIGH (ref 6–20)
CHLORIDE: 106 mmol/L (ref 101–111)
CO2: 20 mmol/L — AB (ref 22–32)
Calcium: 8 mg/dL — ABNORMAL LOW (ref 8.9–10.3)
Creatinine, Ser: 2.35 mg/dL — ABNORMAL HIGH (ref 0.61–1.24)
GFR, EST AFRICAN AMERICAN: 33 mL/min — AB (ref >60)
GFR, EST NON AFRICAN AMERICAN: 28 mL/min — AB (ref >60)
Glucose, Bld: 116 mg/dL — ABNORMAL HIGH (ref 65–99)
POTASSIUM: 4.5 mmol/L (ref 3.5–5.1)
SODIUM: 133 mmol/L — AB (ref 135–145)
Total Bilirubin: 3.3 mg/dL — ABNORMAL HIGH (ref 0.3–1.2)
Total Protein: 4.9 g/dL — ABNORMAL LOW (ref 6.5–8.1)

## 2015-05-08 LAB — PROCALCITONIN: Procalcitonin: 1.56 ng/mL

## 2015-05-08 LAB — GLUCOSE, CAPILLARY: Glucose-Capillary: 68 mg/dL (ref 65–99)

## 2015-05-08 MED ORDER — IOHEXOL 300 MG/ML  SOLN
25.0000 mL | INTRAMUSCULAR | Status: AC
  Administered 2015-05-08: 25 mL via ORAL

## 2015-05-08 NOTE — Progress Notes (Signed)
Pt states he likes the lavender aromatherapy. Madelaine Etienne visited with patient. Lind Guest, RN

## 2015-05-08 NOTE — Progress Notes (Addendum)
Progress Note   [name] BFX:[id number] DOB: 27-Jul-1955 DOA: 05/03/2015 PCP: Charolette Forward, MD   Brief Narrative:   [name] is an 59 y.o. male with a PMH of hypertension, chronic combined CHF, CAD, NSTEMI (on aspirin and plavix), CKD stage 3 who was admitted 05/03/15 with chief complaint of gradually worsening neck swelling. In ED, pt was hemodynamically stable. BP was 91/61, HR 92. Blood work revealed WBC count of 16.8, creatinine 2.26 (baseline 8 months ago 1.9). CT renal stone study showed no hydronephrosis or urolithiasis. LFT's were as follows: AST 299, ALT 283, TB 3.9. His recent abd [place] 04/30/15 showed mild gallbladder wall thickening probably attributable to nondistention/contraction. No gallstones. Other imaging studies included, CXR which showed no acute findings. X ray of the neck showed no acute findings. CT neck showed asymmetric enlargement of the right submandibular gland relative to the left, nonspecific but could be due to infection, inflammation or possibly tumor.   Assessment/Plan:   Principal Problem:   Sepsis due to cellulitis [facility]) with sepsis associated hypotension, neck swelling and leukocytosis - Sepsis criteria met 05/04/15 with hypotension, fever, tachycardia, leukocytosis and lactic acidosis. - Source of infection felt to be from neck cellulitis. Blood cultures negative to date. - Pt was given clindamycin on admission but this was changed to broad spectrum abx, vanco and zosyn for sepsis. - CT neck on admission demonstrated asymmetric enlargement of the right submandibular gland relative to the left. - MRI of the neck 05/04/15 consistent with cellulitis. No evidence of salivary gland mass or other abnormalities. - Blood cultures negative to date. Pro calcitonin elevation consistent with infection.  - Given lack of resolution of WBC elevation, will ask ID to evaluate.  Active Problems:  Acute renal failure superimposed on stage 3 chronic  kidney disease (HCC) - Baseline creatinine about 8 months ago 1.9. - Creatinine on this admission 2.26, possibly from lasix and lisinopril and both of those meds remain on hold. - CT renal stone protocol showed no hydronephrosis or urolithiasis.  - Provided with gentle hydration. Creatinine slowly trending back down and is almost back to baseline values.   Chronic combined systolic (congestive) and diastolic (congestive) heart failure (HCC) - Last 2 D ECHO in 12/2014 with EF of 20%. - Check CXR.  Sees Harwani, may need consult.   Dysphagia - Evaluated by speech therapy 05/04/15: Dysphagia 3 diet recommended.  Oral care TID.  MMW ordered. - Chest x-ray clear on admission.  Repeat, R/O aspiration PNA given ongoing WBC elevation.    Benign essential HTN - BP low in the setting of sepsis, so BP meds on hold: ramipril, imdur, lasix. Give Coreg with parameters.   History of non-ST elevation myocardial infarction (NSTEMI) - Stable, no reports of chest pain.  - Sinus rhythm on 12 lead EKG. - Continue aspirin and plavix. Statin on hold secondary to elevated LFTs.   Hyponatremia - Likely due to CHF etiology. Sodium stable at 132.   Abnormal LFTs - Recent US done 10/13 showed no acute findings, no gallstones seen. - LFTs worse today. HIDA scan & acute hepatitis serologies negative.  - Holding Lipitor. - PT/INR reassuring, platelet count OK. - Evaluated by GI 05/08/15, no further GI evaluation recommended.    Polysubstance abuse / Noncompliance / Homelessness - SW consulted.  - UDS and alcohol level WNL    DVT Prophylaxis  - SCD's bilaterally, aspirin, plavix.   Code Status: Full.  Family Communication: No family at the bedside. Disposition Plan:  Home once source of persistent elevation of LFTs elucidated, SW to assist with disposition given homelessness.  Will likely need several more days in the hospital, with ongoing work up being pursued, ID consultation  requested.  IV Access:    Peripheral IV   Procedures and diagnostic studies:   Nm Hepatobiliary Including Gb  05/07/2015  CLINICAL DATA:  59 year old male in patient with 2 weeks of abdominal pain. Elevated liver function tests. EXAM: NUCLEAR MEDICINE HEPATOBILIARY IMAGING TECHNIQUE: Sequential images of the abdomen were obtained out to 60 minutes following intravenous administration of radiopharmaceutical. RADIOPHARMACEUTICALS:  7.3 mCi Tc-73m Choletec IV COMPARISON:  05/03/2015 CT abdomen/ pelvis. FINDINGS: There is prompt radiotracer uptake by the liver with normal blood pool clearance. There is prompt radiotracer excretion into the common bile duct and small bowel beginning at 5 minutes. There is filling of the gallbladder with radiotracer beginning at 30 minutes, indicating cystic duct patency. There is no evidence of a bile leak. IMPRESSION: 1. Normal filling of the gallbladder with radiotracer, indicating cystic duct patency, which is not consistent with acute cholecystitis. 2. Prompt radiotracer excretion into the common bile duct and small bowel, with no evidence of biliary obstruction. 3. Normal liver radiotracer uptake and excretion. Electronically Signed   By: JIlona SorrelM.D.   On: 05/07/2015 09:00   Mr Neck Soft Tissue Only W Wo Contrast  05/04/2015  CLINICAL DATA:  Initial evaluation for throat and facial swelling, productive cough, recently worsened. Patient also with leukocytosis and concern for cellulitis of neck. Evaluate submandibular gland which was question to be asymmetrically enlarged on prior CT. EXAM: MR NECK SOFT TISSUE ONLY WITHOUT AND WITH CONTRAST TECHNIQUE: Multiplanar, multisequence MR imaging was performed both before and after administration of intravenous contrast. CONTRAST:  732mMULTIHANCE GADOBENATE DIMEGLUMINE 529 MG/ML IV SOLN COMPARISON:  Prior neck CT from 05/03/2015. FINDINGS: Study is degraded by motion artifact. Visualized portions of the brain  demonstrate no acute abnormality. Mucosal thickening within the visualized paranasal sinuses. No air-fluid levels to suggest active sinus infection. Mastoid air cells are grossly clear. The salivary glands including the parotid glands and submandibular glands are relatively symmetric and normal in appearance bilaterally. On today's study, the submandibular glands are fairly symmetric in appearance bilaterally, although the right may be slightly larger than the left, felt to be within normal limits for normal size variation. No asymmetric enhancement or focal mass lesion identified within the right submandibular gland to suggest underlying neoplasm. The submandibular glands enhance in a symmetric fashion bilaterally. Scattered edema involving primarily the submandibular spaces bilaterally, similar to findings seen on prior CT. Given the provided history, finding is concerning for possible cellulitis. No drainable fluid collection or abscess. This is better appreciated on prior CT. No significant enhancement. Oral cavity demonstrates a normal appearance. Palatine tonsils normal. Parapharyngeal fat well-preserved. Nasopharynx and oropharynx within normal limits. Epiglottis is normal. Vallecula is clear. Hypopharynx and supraglottic larynx demonstrate no acute abnormality. Thyroid gland grossly unremarkable. No pathologically enlarged lymph nodes identified within the neck. There are 3 mildly prominent level 1A submental nodes measuring up to 7 mm, likely reactive. Mildly prominent left level 2 lymph nodes measure at the upper limits of normal at approximately 1 cm in short axis. Partially visualized superior mediastinum demonstrates no acute abnormality. Partially visualized lungs are grossly clear. Mild multilevel degenerative changes noted within the visualized cervical spine. Marrow signal intensity within normal limits. Signal intensity within the visualized cord is normal. IMPRESSION: 1. Motion degraded study with  no discrete salivary gland mass or other abnormality identified. 2. Grossly similar subcutaneous edema involving primarily the bilateral submandibular spaces. Again, while this finding is nonspecific, possible infection could have this appearance given the history of leukocytosis and concern for cellulitis. This is better evaluated on prior CT. No discrete abscess. Electronically Signed   By: Jeannine Boga M.D.   On: 05/04/2015 22:42     Medical Consultants:    GI: Irene Shipper, MD  ID: Scharlene Gloss, MD  Anti-Infectives:   Anti-infectives    Start     Dose/Rate Route Frequency Ordered Stop   05/07/15 0700  vancomycin (VANCOCIN) IVPB 1000 mg/200 mL premix     1,000 mg 200 mL/hr over 60 Minutes Intravenous Every 24 hours 05/07/15 0611     05/04/15 1100  piperacillin-tazobactam (ZOSYN) IVPB 3.375 g     3.375 g 12.5 mL/hr over 240 Minutes Intravenous 3 times per day 05/04/15 1003     05/04/15 0600  vancomycin (VANCOCIN) IVPB 750 mg/150 ml premix  Status:  Discontinued     750 mg 150 mL/hr over 60 Minutes Intravenous Every 24 hours 05/04/15 0557 05/07/15 0610   05/03/15 2200  clindamycin (CLEOCIN) IVPB 600 mg  Status:  Discontinued     600 mg 100 mL/hr over 30 Minutes Intravenous 3 times per day 05/03/15 1524 05/04/15 0225   05/03/15 1330  clindamycin (CLEOCIN) IVPB 600 mg     600 mg 100 mL/hr over 30 Minutes Intravenous  Once 05/03/15 1326 05/03/15 1457      Subjective:   Forde Radon reports ongoing swelling in left hand and around foreskin of penis.  Some mild SOB.  Occasional abdominal discomfort.  Occasional cough.    Objective:    Filed Vitals:   05/07/15 1724 05/07/15 2027 05/08/15 0500 05/08/15 0657  BP: 80/57 97/83  85/69  Pulse: 62 111  95  Temp:  99.2 F (37.3 C)  98.4 F (36.9 C)  TempSrc:  Oral  Oral  Resp:  18  18  Height:      Weight:   77.5 kg (170 lb 13.7 oz)   SpO2:  100%  100%    Intake/Output Summary (Last 24 hours) at 05/08/15 0739 Last  data filed at 05/07/15 2300  Gross per 24 hour  Intake 909.67 ml  Output    300 ml  Net 609.67 ml   Filed Weights   05/06/15 0500 05/07/15 0458 05/08/15 0500  Weight: 80.6 kg (177 lb 11.1 oz) 75 kg (165 lb 5.5 oz) 77.5 kg (170 lb 13.7 oz)    Exam: Gen:  NAD Neck: Swelling improved. Cardiovascular:  RRR, No M/R/G Respiratory:  Lungs CTAB Gastrointestinal:  Abdomen soft, NT/ND, + BS GU: Swelling around foreskin, no sores/erythema Extremities:  Left hand swollen, +swelling BLE, 1+   Data Reviewed:    Labs: Basic Metabolic Panel:  Recent Labs Lab 05/03/15 1542 05/04/15 0255 05/05/15 0525 05/06/15 0519 05/07/15 0457 05/08/15 0520  NA 131* 131* 132* 132* 134* 133*  K 4.4 4.9 4.2 4.3 4.2 4.5  CL 103 104 103 105 108 106  CO2 20* 20* 18* 17* 19* 20*  GLUCOSE 98 136* 88 69 83 116*  BUN 45* 50* 38* 33* 32* 33*  CREATININE 2.31* 2.36* 2.05* 2.10* 2.19* 2.35*  CALCIUM 8.0* 7.9* 8.3* 8.0* 8.1* 8.0*  MG 1.9  --   --   --   --   --   PHOS 3.8  --   --   --   --   --  GFR Estimated Creatinine Clearance: 31.9 mL/min (by C-G formula based on Cr of 2.35). Liver Function Tests:  Recent Labs Lab 05/03/15 1542 05/04/15 0255 05/06/15 0519 05/07/15 0457 05/08/15 0520  AST 226* 195* 547* 561* 517*  ALT 229* 218* 419* 461* 460*  ALKPHOS 78 93 155* 197* 237*  BILITOT 3.0* 2.7* 3.0* 3.2* 3.3*  PROT 5.5* 5.0* 4.7* 4.5* 4.9*  ALBUMIN 3.0* 2.8* 2.5* 2.4* 2.5*    Recent Labs Lab 05/03/15 1009  LIPASE 16*   CBC:  Recent Labs Lab 05/03/15 1009 05/03/15 1542 05/04/15 0255 05/05/15 0525 05/06/15 0519 05/08/15 0520  WBC 16.8* 17.4* 16.2* 15.4* 19.9* 23.8*  NEUTROABS 10.4* 11.4*  --   --   --   --   HGB 17.8* 14.4 13.4 14.1 12.2* 10.8*  HCT 51.4 41.7 39.3 42.1 35.6* 31.3*  MCV 99.8 100.2* 99.7 99.5 98.6 101.0*  PLT 253 230 225 209 199 213   BNP (last 3 results)  Recent Labs  04/13/15 1012  PROBNP 3040.00*   CBG:  Recent Labs Lab 05/05/15 0810  05/06/15 0740 05/07/15 0908 05/07/15 1023 05/07/15 1723  GLUCAP 75 45* 35* 121* 123*   Sepsis Labs:  Recent Labs Lab 05/04/15 0255 05/04/15 0541 05/04/15 1130 05/04/15 1400 05/05/15 0525 05/06/15 0519 05/07/15 1439 05/08/15 0520  PROCALCITON  --   --  3.23  --   --   --  1.86 1.56  WBC 16.2*  --   --   --  15.4* 19.9*  --  23.8*  LATICACIDVEN 2.7* 2.9* 3.6* 3.6*  --   --  2.6*  --    Microbiology Recent Results (from the past 240 hour(s))  Culture, blood (routine x 2)     Status: None (Preliminary result)   Collection Time: 05/04/15  2:50 AM  Result Value Ref Range Status   Specimen Description BLOOD RIGHT ARM  Final   Special Requests BOTTLES DRAWN AEROBIC AND ANAEROBIC [place]  Final   Culture   Final    NO GROWTH 3 DAYS Performed at [facility]    Report Status PENDING  Incomplete  Culture, blood (routine x 2)     Status: None (Preliminary result)   Collection Time: 05/04/15  2:55 AM  Result Value Ref Range Status   Specimen Description BLOOD RIGHT HAND  Final   Special Requests BOTTLES DRAWN AEROBIC ONLY 10CC  Final   Culture   Final    NO GROWTH 3 DAYS Performed at [facility]    Report Status PENDING  Incomplete  Culture, Urine     Status: None   Collection Time: 05/04/15 10:57 PM  Result Value Ref Range Status   Specimen Description URINE, RANDOM  Final   Special Requests NONE  Final   Culture   Final    NO GROWTH 1 DAY Performed at [facility]    Report Status 05/06/2015 FINAL  Final     Medications:   . aspirin EC  81 mg Oral Daily  . carvedilol  3.125 mg Oral BID WC  . clopidogrel  75 mg Oral Daily  . diphenhydrAMINE  25 mg Oral Q6H  . famotidine  20 mg Oral Daily  . magic mouthwash  5 mL Oral QID  . mirabegron ER  25 mg Oral Daily  . piperacillin-tazobactam (ZOSYN)  IV  3.375 g Intravenous 3 times per day  . sodium chloride  250 mL Intravenous Once  . sodium chloride  3 mL Intravenous Q12H  .  vancomycin  1,000 mg  Intravenous Q24H   Continuous Infusions:    Time spent: 35 minutes with > 50% of time discussing current diagnostic test results, clinical impression and plan of care, coordination of care with multiple specialists;  ongoing diagnostic work up needed.    LOS: 4 days   [facility] Pager [phone]. If unable to reach me by pager, please call my cell phone at [phone].  *Please refer to [email or web address], password [email or web address] to get updated schedule on who will round on this patient, as hospitalists switch teams weekly. If 7PM-7AM, please contact night-coverage at [email or web address], password [email or web address] for any overnight needs.  05/08/2015, 7:39 AM

## 2015-05-08 NOTE — Progress Notes (Signed)
Pt tearful. States he "just wants to get better". Emotional support given and offered Chaplain visit. Also offered aromatherapy. Pt agreed to both. Chaplain paged and informed. States he will visit pt. 2 drops lavender eo placed on gauze in paper med cup and given to pt for aromatherapy. Lind Guest, RN

## 2015-05-08 NOTE — Consult Note (Addendum)
[place] for Infectious Disease      Reason for Consult:leukocytosis    Referring Physician: Dr. Rockne Menghini  Principal Problem:   Sepsis due to cellulitis [facility]) Active Problems:   Homelessness   Neck swelling   Acute renal failure superimposed on stage 3 chronic kidney disease (HCC)   Chronic combined systolic (congestive) and diastolic (congestive) heart failure (HCC)   Benign essential HTN   History of non-ST elevation myocardial infarction (NSTEMI)   Hyponatremia   Abnormal LFTs   Polysubstance abuse   Noncompliance   Leukocytosis   Cellulitis of neck   Dysphagia   . aspirin EC  81 mg Oral Daily  . carvedilol  3.125 mg Oral BID WC  . clopidogrel  75 mg Oral Daily  . diphenhydrAMINE  25 mg Oral Q6H  . famotidine  20 mg Oral Daily  . magic mouthwash  5 mL Oral QID  . mirabegron ER  25 mg Oral Daily  . piperacillin-tazobactam (ZOSYN)  IV  3.375 g Intravenous 3 times per day  . sodium chloride  250 mL Intravenous Once  . sodium chloride  3 mL Intravenous Q12H  . vancomycin  1,000 mg Intravenous Q24H    Recommendations: Hold antibiotics pending a source Advised him to abstain from drugs and alcohol HIV, CMV, EBV Consider holding carvedilol due to hypotension  Assessment: He has transaminitis and some mild bili elevation more consistent with a hepatic insult.  DDx includes mediacations, alcohol (level negative), infection such as acute CMV, EBV, HIV.     He also has a mild submandibular mass noted in September and again during this hospitalization.  It is mild and with the chronicity, I do not suspect acute infection.     Leukocytosis of unknown etiology, rising.  DDx includes occult infection, SBP, leukemoid reaction.    Eosinophilia with rash, transaminitis.  ? HIV related.  ?Vitiligo.  Malignancy or infection with adrenal mass (such as mycobacterial) of left adrenal gland (8/16 CT)? Addison's (very mild hyponatremia though).     Metabolic alkalosis and lactic  acidosis.    Dr. Tommy Medal to follow up over the weekend  Antibiotics: Vancomycin and zosyn  HPI: [name] is a 59 y.o. male with CHF, CAD and history of NSTEMI, CKD stage 3 who came in with a complaint according to him of rash, swelling and general malaise.  He also reported worsening neck swelling to the admitting physician that worsened.  No sore throat, no dysphagia, no SOB. Pururitic rash diffusely.  History of cocaine use.  Neck swelling has been going on for several weeks and had CT neck Sept 2, 2016 and mild submandibular swelling similar to CT this time.  He reports his symptoms have been ongoing for months.  His WBC here has been increasing from the initial 16.8 (previously normal) and notable for eosinophilia.  CT now of abd pelvis with no significant findings, anasarca.  He also reports being on colchicine bid prior to admission.  CT neck noted and not significant submandibular swelling.   Review of previous ED visits and history with previous CT finding of submandibular swelling, CT abdomen with adrenal gland nodules, previous cocaine use.  No previous eosinophilia.    Review of Systems:  Constitutional: positive for fatigue and malaise Cardiovascular: negative for chest pain, palpitations Gastrointestinal: positive for abdominal pain All other systems reviewed and are negative   Past Medical History  Diagnosis Date  . Hypertension   . Homelessness   . Urinary hesitancy   .  Hypercholesterolemia   . NSTEMI (non-ST elevated myocardial infarction) ([place]) 01/07/2015  . Heart attack ([place]) 07/2014  . Walking pneumonia 07/2014  . Headache     "probably weekly" (01/07/2015)  . Arthritis     "all over" (01/07/2015)  . Anxiety   . Depression   . Noncompliance   . Polysubstance abuse     etoh, cocaine  . Ischemic dilated cardiomyopathy   . CKD (chronic kidney disease), stage IV ([place])   . History of echocardiogram 12/2014    EF 20-25%    Social History  Substance Use Topics    . Smoking status: Former Smoker -- 1.50 packs/day for 30 years    Types: Cigarettes    Quit date: 02/19/2004  . Smokeless tobacco: Never Used  . Alcohol Use: Yes     Comment: social    FMHx: no autoimmune diseases  No Known Allergies  OBJECTIVE: Blood pressure 88/68, pulse 66, temperature 98.8 F (37.1 C), temperature source Oral, resp. rate 18, height 5\' 8"  (1.727 m), weight 170 lb 13.7 oz (77.5 kg), SpO2 99 %. General: awake, alert, nad Eyes: anicteric ENTM: no thrush Skin: bilateral scaley rash, dry, pruritic Lungs: CTA B Cor: RRR Abdomen: soft, some mild tenderness diffusely but no rebound, no guarding Ext: left hand edema, bilateral foot edema Neuro: non-focal  Lab Results  Component Value Date   WBC 23.8* 05/08/2015   HGB 10.8* 05/08/2015   HCT 31.3* 05/08/2015   MCV 101.0* 05/08/2015   PLT 213 05/08/2015    Lab Results  Component Value Date   CREATININE 2.35* 05/08/2015   BUN 33* 05/08/2015   NA 133* 05/08/2015   K 4.5 05/08/2015   CL 106 05/08/2015   CO2 20* 05/08/2015    Lab Results  Component Value Date   ALT 460* 05/08/2015   AST 517* 05/08/2015   ALKPHOS 237* 05/08/2015     Microbiology: Recent Results (from the past 240 hour(s))  Culture, blood (routine x 2)     Status: None (Preliminary result)   Collection Time: 05/04/15  2:50 AM  Result Value Ref Range Status   Specimen Description BLOOD RIGHT ARM  Final   Special Requests BOTTLES DRAWN AEROBIC AND ANAEROBIC [place]  Final   Culture   Final    NO GROWTH 3 DAYS Performed at [facility]    Report Status PENDING  Incomplete  Culture, blood (routine x 2)     Status: None (Preliminary result)   Collection Time: 05/04/15  2:55 AM  Result Value Ref Range Status   Specimen Description BLOOD RIGHT HAND  Final   Special Requests BOTTLES DRAWN AEROBIC ONLY 10CC  Final   Culture   Final    NO GROWTH 3 DAYS Performed at [facility]    Report Status PENDING  Incomplete  Culture,  Urine     Status: None   Collection Time: 05/04/15 10:57 PM  Result Value Ref Range Status   Specimen Description URINE, RANDOM  Final   Special Requests NONE  Final   Culture   Final    NO GROWTH 1 DAY Performed at [facility]    Report Status 05/06/2015 FINAL  Final    Scharlene Gloss, [place] for Infectious Disease [facility] [email or web address] [phone] pager  [phone] cell 05/08/2015, 1:58 PM

## 2015-05-08 NOTE — Clinical Social Work Note (Signed)
Clinical Social Work Assessment-Late Entry  Patient Details  Name: [name] MRN: [id number] Date of Birth: 1956-04-26  Date of referral:  05/07/15               Reason for consult:  Substance Use/ETOH Abuse                Permission sought to share information with:    Permission granted to share information::     Name::        Agency::     Relationship::     Contact Information:     Housing/Transportation Living arrangements for the past 2 months:  Hotel/Motel Source of Information:  Patient Patient Interpreter Needed:  None Criminal Activity/Legal Involvement Pertinent to Current Situation/Hospitalization:  No - Comment as needed Significant Relationships:  Friend Lives with:  Self Do you feel safe going back to the place where you live?  Yes Need for family participation in patient care:  No (Coment)  Care giving concerns:  CSW received referral for Current Substance Abuse.   Social Worker assessment / plan:  CSW received referral for current substance abuse.  CSW met with pt at bedside. BSW intern present at this time. CSW introduced self and explained role.   Pt reports that he currently lives in a hotel in which is paid by the government as pt is in a program. Pt unable to recall program. CSW discussed with pt if pt has any concerns surround substance use. Pt states that he does not have any current concerns has he has not used in a month and a half to two months. Upon chart review, UDS was within normal limits and no substances detected. Pt acknowledged that he has had a problem with substance use in the past, but became sober since living in the hotel and attending the meetings provided by the program. Pt states that he is very active in the program and has been proud of his progress. CSW provided positive reinforcement for pt surrounding his sobriety. Pt open to receiving resources to have in case needed in the future.  SBIRT not completed as pt has been sober for  past month and a half to two months.   No further social work needs identified at this time.  CSW signing off.   Employment status:  Retired Forensic scientist:  Medicaid In [place] PT Recommendations:  Not assessed at this time Information / Referral to community resources:  Outpatient Substance Abuse Treatment Options  Patient/Family's Response to care:  Pt alert and oriented x 4. Pt pleasant and actively engaged in assessment. Pt open to discussing his past issues with substance abuse, but does not have any current concerns.  Patient/Family's Understanding of and Emotional Response to Diagnosis, Current Treatment, and Prognosis:  Pt expressed understanding how past substance use may be impacting his medical issues today. Pt plans to continue his sobriety.   Emotional Assessment Appearance:  Appears stated age Attitude/Demeanor/Rapport:  Other (pt appropriate) Affect (typically observed):  Appropriate Orientation:  Oriented to Self, Oriented to Place, Oriented to  Time, Oriented to Situation Alcohol / Substance use:  Not Applicable Psych involvement (Current and /or in the community):  No (Comment)  Discharge Needs  Concerns to be addressed:  Substance Abuse Concerns Readmission within the last 30 days:  No Current discharge risk:  None Barriers to Discharge:  No Barriers Identified   [place], D'Lo, LCSW 05/08/2015, 10:09 AM  [phone]

## 2015-05-09 DIAGNOSIS — R21 Rash and other nonspecific skin eruption: Secondary | ICD-10-CM | POA: Insufficient documentation

## 2015-05-09 DIAGNOSIS — R682 Dry mouth, unspecified: Secondary | ICD-10-CM | POA: Insufficient documentation

## 2015-05-09 DIAGNOSIS — K1120 Sialoadenitis, unspecified: Secondary | ICD-10-CM | POA: Insufficient documentation

## 2015-05-09 DIAGNOSIS — R945 Abnormal results of liver function studies: Secondary | ICD-10-CM

## 2015-05-09 DIAGNOSIS — L509 Urticaria, unspecified: Secondary | ICD-10-CM | POA: Insufficient documentation

## 2015-05-09 DIAGNOSIS — R7989 Other specified abnormal findings of blood chemistry: Secondary | ICD-10-CM | POA: Insufficient documentation

## 2015-05-09 LAB — CBC
HEMATOCRIT: 31.2 % — AB (ref 39.0–52.0)
HEMOGLOBIN: 10.9 g/dL — AB (ref 13.0–17.0)
MCH: 36.2 pg — ABNORMAL HIGH (ref 26.0–34.0)
MCHC: 34.9 g/dL (ref 30.0–36.0)
MCV: 103.7 fL — ABNORMAL HIGH (ref 78.0–100.0)
Platelets: 229 10*3/uL (ref 150–400)
RBC: 3.01 MIL/uL — ABNORMAL LOW (ref 4.22–5.81)
RDW: 14.7 % (ref 11.5–15.5)
WBC: 26.8 10*3/uL — AB (ref 4.0–10.5)

## 2015-05-09 LAB — COMPREHENSIVE METABOLIC PANEL
ALK PHOS: 241 U/L — AB (ref 38–126)
ALT: 510 U/L — ABNORMAL HIGH (ref 17–63)
ANION GAP: 9 (ref 5–15)
AST: 599 U/L — ABNORMAL HIGH (ref 15–41)
Albumin: 2.6 g/dL — ABNORMAL LOW (ref 3.5–5.0)
BILIRUBIN TOTAL: 3.9 mg/dL — AB (ref 0.3–1.2)
BUN: 34 mg/dL — ABNORMAL HIGH (ref 6–20)
CALCIUM: 8.2 mg/dL — AB (ref 8.9–10.3)
CO2: 20 mmol/L — ABNORMAL LOW (ref 22–32)
Chloride: 105 mmol/L (ref 101–111)
Creatinine, Ser: 2.47 mg/dL — ABNORMAL HIGH (ref 0.61–1.24)
GFR calc Af Amer: 31 mL/min — ABNORMAL LOW (ref >60)
GFR, EST NON AFRICAN AMERICAN: 27 mL/min — AB (ref >60)
GLUCOSE: 70 mg/dL (ref 65–99)
POTASSIUM: 4.9 mmol/L (ref 3.5–5.1)
Sodium: 134 mmol/L — ABNORMAL LOW (ref 135–145)
TOTAL PROTEIN: 5.3 g/dL — AB (ref 6.5–8.1)

## 2015-05-09 LAB — CULTURE, BLOOD (ROUTINE X 2)
Culture: NO GROWTH
Culture: NO GROWTH

## 2015-05-09 LAB — EPSTEIN-BARR VIRUS VCA, IGM

## 2015-05-09 LAB — HIV ANTIBODY (ROUTINE TESTING W REFLEX): HIV SCREEN 4TH GENERATION: NONREACTIVE

## 2015-05-09 LAB — CMV IGM

## 2015-05-09 LAB — PROCALCITONIN: Procalcitonin: 1.85 ng/mL

## 2015-05-09 LAB — GLUCOSE, CAPILLARY: Glucose-Capillary: 57 mg/dL — ABNORMAL LOW (ref 65–99)

## 2015-05-09 NOTE — Progress Notes (Signed)
[facility]    Subjective: No new complaints, c/o scaling on skin, edema of foreskin, dry mouth   Antibiotics:  Anti-infectives    Start     Dose/Rate Route Frequency Ordered Stop   05/07/15 0700  vancomycin (VANCOCIN) IVPB 1000 mg/200 mL premix  Status:  Discontinued     1,000 mg 200 mL/hr over 60 Minutes Intravenous Every 24 hours 05/07/15 0611 05/08/15 1438   05/04/15 1100  piperacillin-tazobactam (ZOSYN) IVPB 3.375 g  Status:  Discontinued     3.375 g 12.5 mL/hr over 240 Minutes Intravenous 3 times per day 05/04/15 1003 05/08/15 1438   05/04/15 0600  vancomycin (VANCOCIN) IVPB 750 mg/150 ml premix  Status:  Discontinued     750 mg 150 mL/hr over 60 Minutes Intravenous Every 24 hours 05/04/15 0557 05/07/15 0610   05/03/15 2200  clindamycin (CLEOCIN) IVPB 600 mg  Status:  Discontinued     600 mg 100 mL/hr over 30 Minutes Intravenous 3 times per day 05/03/15 1524 05/04/15 0225   05/03/15 1330  clindamycin (CLEOCIN) IVPB 600 mg     600 mg 100 mL/hr over 30 Minutes Intravenous  Once 05/03/15 1326 05/03/15 1457      Medications: Scheduled Meds: . aspirin EC  81 mg Oral Daily  . clopidogrel  75 mg Oral Daily  . diphenhydrAMINE  25 mg Oral Q6H  . famotidine  20 mg Oral Daily  . magic mouthwash  5 mL Oral QID  . mirabegron ER  25 mg Oral Daily  . sodium chloride  250 mL Intravenous Once  . sodium chloride  3 mL Intravenous Q12H   Continuous Infusions:  PRN Meds:.acetaminophen **OR** acetaminophen, colchicine, ondansetron **OR** ondansetron (ZOFRAN) IV, technetium TC 75M mebrofenin, traMADol    Objective: Weight change: -2 lb 10.3 oz (-1.2 kg)  Intake/Output Summary (Last 24 hours) at 05/09/15 1737 Last data filed at 05/08/15 2131  Gross per 24 hour  Intake      0 ml  Output    200 ml  Net   -200 ml   Blood pressure 116/79, pulse 110, temperature 99.1 F (37.3 C), temperature source Oral, resp. rate 18, height '5\' 8"'   (1.727 m), weight 168 lb 3.4 oz (76.3 kg), SpO2 100 %. Temp:  [97.6 F (36.4 C)-100.3 F (37.9 C)] 99.1 F (37.3 C) (10/22 1500) Pulse Rate:  [87-110] 110 (10/22 1515) Resp:  [18] 18 (10/22 1500) BP: (94-116)/(57-79) 116/79 mmHg (10/22 1500) SpO2:  [100 %] 100 % (10/22 1500) Weight:  [168 lb 3.4 oz (76.3 kg)] 168 lb 3.4 oz (76.3 kg) (10/22 0512)  Physical Exam: General: Alert and awake, oriented x3, not in any acute distress. HEENT: anicteric sclera, pupils reactive to light and accommodation, EOMI Neck few LN, no clear tenderness CVS regular rate, normal r,  no murmur rubs or gallops Chest: clear to auscultation bilaterally, no wheezing, rales or rhonchi Abdomen: soft nontender, nondistended, normal bowel sounds, Extremities: no  clubbing or edema noted bilaterally GU: foreskin edematous Skin:   05/09/15: Scaling diffuse rash        Neuro: nonfocal  CBC: CBC Latest Ref Rng 05/09/2015 05/08/2015 05/06/2015  WBC 4.0 - 10.5 K/uL 26.8(H) 23.8(H) 19.9(H)  Hemoglobin 13.0 - 17.0 g/dL 10.9(L) 10.8(L) 12.2(L)  Hematocrit 39.0 - 52.0 % 31.2(L) 31.3(L) 35.6(L)  Platelets 150 - 400 K/uL 229 213 199       BMET  Recent Labs  05/08/15  0520 05/09/15 0559  NA 133* 134*  K 4.5 4.9  CL 106 105  CO2 20* 20*  GLUCOSE 116* 70  BUN 33* 34*  CREATININE 2.35* 2.47*  CALCIUM 8.0* 8.2*     Liver Panel   Recent Labs  05/08/15 0520 05/09/15 0559  PROT 4.9* 5.3*  ALBUMIN 2.5* 2.6*  AST 517* 599*  ALT 460* 510*  ALKPHOS 237* 241*  BILITOT 3.3* 3.9*       Sedimentation Rate No results for input(s): ESRSEDRATE in the last 72 hours. C-Reactive Protein No results for input(s): CRP in the last 72 hours.  Micro Results: Recent Results (from the past 720 hour(s))  Culture, blood (routine x 2)     Status: None   Collection Time: 05/04/15  2:50 AM  Result Value Ref Range Status   Specimen Description BLOOD RIGHT ARM  Final   Special Requests BOTTLES DRAWN AEROBIC  AND ANAEROBIC [place]  Final   Culture   Final    NO GROWTH 5 DAYS Performed at [facility]    Report Status 05/09/2015 FINAL  Final  Culture, blood (routine x 2)     Status: None   Collection Time: 05/04/15  2:55 AM  Result Value Ref Range Status   Specimen Description BLOOD RIGHT HAND  Final   Special Requests BOTTLES DRAWN AEROBIC ONLY 10CC  Final   Culture   Final    NO GROWTH 5 DAYS Performed at [facility]    Report Status 05/09/2015 FINAL  Final  Culture, Urine     Status: None   Collection Time: 05/04/15 10:57 PM  Result Value Ref Range Status   Specimen Description URINE, RANDOM  Final   Special Requests NONE  Final   Culture   Final    NO GROWTH 1 DAY Performed at [facility]    Report Status 05/06/2015 FINAL  Final    Studies/Results: Ct Abdomen Pelvis Wo Contrast  05/08/2015  CLINICAL DATA:  Abdominal pain, elevated liver function studies. Sepsis and leukocytosis. EXAM: CT ABDOMEN AND PELVIS WITHOUT CONTRAST TECHNIQUE: Multidetector CT imaging of the abdomen and pelvis was performed following the standard protocol without IV contrast. COMPARISON:  CT scan 05/03/2015 and 04/23/2015 FINDINGS: Lower chest: The lung bases are clear. The heart is mildly enlarged. No pericardial effusion. The distal esophagus is grossly normal. Hepatobiliary: No focal hepatic lesions or intrahepatic biliary dilatation. The gallbladder is grossly normal. No common bile duct dilatation. Pancreas: No mass, inflammation or ductal dilatation. Spleen: Normal size.  No focal lesions. Adrenals/Urinary Tract: Stable nodularity of the left adrenal gland, likely small adenomas. The right adrenal gland is normal. Both kidneys are normal. No renal or obstructing ureteral calculi or bladder calculi. Stomach/Bowel: The stomach, duodenum, small bowel and colon are grossly normal. No inflammatory changes, mass lesions or obstructive findings. The terminal ileum is normal. The appendix is  normal. Vascular/Lymphatic: No mesenteric or retroperitoneal mass or adenopathy. Scattered aortic calcifications but no aneurysm. Other: The prostate gland is mildly enlarged. This is stable. The bladder is normal. The the seminal vesicles are normal. No pelvic mass or adenopathy. No free pelvic fluid collections. No inguinal mass or adenopathy. Stable scattered borderline inguinal lymph nodes. Diffuse subcutaneous soft tissue swelling/ edema/fluid could suggest anasarca. No focal abscess. Musculoskeletal: No significant bony findings. IMPRESSION: 1. Diffuse body wall edema suggesting anasarca. 2. Mesenteric edema but no overt ascites. 3. No acute intra-abdominal/abnormalities. 4. Stable prostate gland enlargement and small hiatal hernia. Electronically Signed  By: Marijo Sanes M.D.   On: 05/08/2015 13:59   Dg Chest Port 1 View  05/08/2015  CLINICAL DATA:  Several day history of productive cough, shortness of breath and dyspnea. EXAM: PORTABLE CHEST 1 VIEW COMPARISON:  05/03/2015 FINDINGS: The heart is upper limits of normal in size and stable given the AP projection. The mediastinal and hilar contours are normal. The lungs are clear. No pleural effusion. The bony thorax is intact. IMPRESSION: No acute cardiopulmonary findings. Electronically Signed   By: Marijo Sanes M.D.   On: 05/08/2015 10:00      Assessment/Plan:  INTERVAL HISTORY:  05/08/15: antibiotics stopped   Principal Problem:   Sepsis due to cellulitis [facility]) Active Problems:   Homelessness   Neck swelling   Acute renal failure superimposed on stage 3 chronic kidney disease (HCC)   Chronic combined systolic (congestive) and diastolic (congestive) heart failure (HCC)   Benign essential HTN   History of non-ST elevation myocardial infarction (NSTEMI)   Hyponatremia   Abnormal LFTs   Polysubstance abuse   Noncompliance   Leukocytosis   Cellulitis of neck   Dysphagia    Alanson Kepner is a 59 y.o. male with  Hx of CHF, HTN,  polysubstance abuse with main problem being crack cocaine who was admitted with neck pain, swelling (note he had been seen for same complaint on 03/20/15 when he had a different CT neck, admitted with concern for sepsis, ad,otted and placed on vancomycin, clindamycin, then vancomycin and zosyn with persistent temperatures and leukocytosis. CT neck, MRI neck, CT abdomen and pelvis have not disclosed clear cause of fevers high WBC.   He has been taken off abx since there is no target for them  He DOES have a rash which could be cluse to underlying problem along with his dry mouth, ? Parotid irritation, high WBC  I am ordering further AI labs including  ANA, anti-DS ab, RF, anti CCP, ACE, SSA/SSAB, ANCA's ESR, CRP. Angiotensin Converting enzyme level  I wonder with his cocaine use if levamisole induced vasculitis could be what is going on though his tox screen in clean and he claims to have been clean for several months perhaps he had levamisole induced vasculitis  With cocaine use in last month and it is persisting despite lack of cocaine on drug screen  A punch biopsy of his rash might also be informative.       LOS: 5 days   Alcide Evener 05/09/2015, 5:37 PM

## 2015-05-09 NOTE — Progress Notes (Signed)
Progress Note   [name] HVF:[id number] DOB: 01-23-1956 DOA: 05/03/2015 PCP: Charolette Forward, MD   Brief Narrative:   [name] is an 59 y.o. male with a PMH of hypertension, chronic combined CHF, CAD, NSTEMI (on aspirin and plavix), CKD stage 3 who was admitted 05/03/15 with chief complaint of gradually worsening neck swelling. In ED, pt was hemodynamically stable. BP was 91/61, HR 92. Blood work revealed WBC count of 16.8, creatinine 2.26 (baseline 8 months ago 1.9). CT renal stone study showed no hydronephrosis or urolithiasis. LFT's were as follows: AST 299, ALT 283, TB 3.9. His recent abd [place] 04/30/15 showed mild gallbladder wall thickening probably attributable to nondistention/contraction. No gallstones. Other imaging studies included, CXR which showed no acute findings. X ray of the neck showed no acute findings. CT neck showed asymmetric enlargement of the right submandibular gland relative to the left, nonspecific but could be due to infection, inflammation or possibly tumor.   Assessment/Plan:   Principal Problem:   Sepsis due to cellulitis [facility]) with sepsis associated hypotension, neck swelling and leukocytosis - Sepsis criteria met 05/04/15 with hypotension, fever, tachycardia, leukocytosis and lactic acidosis. - Source of infection felt to be from neck cellulitis. Blood cultures negative to date. - Pt was given clindamycin on admission but this was changed to broad spectrum abx, vanco and zosyn for sepsis. - CT neck on admission demonstrated asymmetric enlargement of the right submandibular gland relative to the left. - MRI of the neck 05/04/15 consistent with cellulitis. No evidence of salivary gland mass or other abnormalities. - Blood cultures negative to date. Pro calcitonin elevation consistent with infection.  - Antibiotics discontinued 05/08/15 after ID evaluation. HIV negative. Follow-up CMV, EBV.  Active Problems:  Acute renal failure superimposed on  stage 3 chronic kidney disease (HCC) - Baseline creatinine about 8 months ago 1.9. - Creatinine on this admission 2.26, possibly from lasix and lisinopril and both of those meds remain on hold. - CT renal stone protocol showed no hydronephrosis or urolithiasis.  - Provided with gentle hydration. Creatinine slowly trending back down and is almost back to baseline values.   Chronic combined systolic (congestive) and diastolic (congestive) heart failure (HCC) - Last 2 D ECHO in 12/2014 with EF of 20%. - Repeat chest x-ray 05/08/15: Clear.Judie Bonus, may need consult.   Dysphagia - Evaluated by speech therapy 05/04/15: Dysphagia 3 diet recommended.  Oral care TID.  MMW ordered. - Chest x-ray clear on admission.  Repeat, R/O aspiration PNA given ongoing WBC elevation.    Benign essential HTN - All blood pressure medicines currently on hold.   History of non-ST elevation myocardial infarction (NSTEMI) - Stable, no reports of chest pain.  - Sinus rhythm on 12 lead EKG. - Continue aspirin and plavix. Statin on hold secondary to elevated LFTs.   Hyponatremia - Likely due to CHF etiology. Sodium stable at 132.   Abnormal LFTs - Recent US done 10/13 showed no acute findings, no gallstones seen. - LFTs worse today. HIDA scan & acute hepatitis serologies negative.  - Holding Lipitor. - PT/INR reassuring, platelet count OK. - Evaluated by GI 05/08/15, no further GI evaluation recommended.    Polysubstance abuse / Noncompliance / Homelessness - SW consulted. Counseled regarding abstinence. - UDS and alcohol level WNL    DVT Prophylaxis  - SCD's bilaterally, aspirin, plavix.   Code Status: Full.  Family Communication: No family at the bedside. Disposition Plan: Home once source of persistent elevation of LFTs  elucidated, SW to assist with disposition given homelessness.  Will likely need several more days in the hospital, with ongoing work up being pursued, ID consultation  assisting in further work up.  IV Access:    Peripheral IV   Procedures and diagnostic studies:   Ct Abdomen Pelvis Wo Contrast  05/08/2015  CLINICAL DATA:  Abdominal pain, elevated liver function studies. Sepsis and leukocytosis. EXAM: CT ABDOMEN AND PELVIS WITHOUT CONTRAST TECHNIQUE: Multidetector CT imaging of the abdomen and pelvis was performed following the standard protocol without IV contrast. COMPARISON:  CT scan 05/03/2015 and 04/23/2015 FINDINGS: Lower chest: The lung bases are clear. The heart is mildly enlarged. No pericardial effusion. The distal esophagus is grossly normal. Hepatobiliary: No focal hepatic lesions or intrahepatic biliary dilatation. The gallbladder is grossly normal. No common bile duct dilatation. Pancreas: No mass, inflammation or ductal dilatation. Spleen: Normal size.  No focal lesions. Adrenals/Urinary Tract: Stable nodularity of the left adrenal gland, likely small adenomas. The right adrenal gland is normal. Both kidneys are normal. No renal or obstructing ureteral calculi or bladder calculi. Stomach/Bowel: The stomach, duodenum, small bowel and colon are grossly normal. No inflammatory changes, mass lesions or obstructive findings. The terminal ileum is normal. The appendix is normal. Vascular/Lymphatic: No mesenteric or retroperitoneal mass or adenopathy. Scattered aortic calcifications but no aneurysm. Other: The prostate gland is mildly enlarged. This is stable. The bladder is normal. The the seminal vesicles are normal. No pelvic mass or adenopathy. No free pelvic fluid collections. No inguinal mass or adenopathy. Stable scattered borderline inguinal lymph nodes. Diffuse subcutaneous soft tissue swelling/ edema/fluid could suggest anasarca. No focal abscess. Musculoskeletal: No significant bony findings. IMPRESSION: 1. Diffuse body wall edema suggesting anasarca. 2. Mesenteric edema but no overt ascites. 3. No acute intra-abdominal/abnormalities. 4. Stable  prostate gland enlargement and small hiatal hernia. Electronically Signed   By: Marijo Sanes M.D.   On: 05/08/2015 13:59   Nm Hepatobiliary Including Gb  05/07/2015  CLINICAL DATA:  59 year old male in patient with 2 weeks of abdominal pain. Elevated liver function tests. EXAM: NUCLEAR MEDICINE HEPATOBILIARY IMAGING TECHNIQUE: Sequential images of the abdomen were obtained out to 60 minutes following intravenous administration of radiopharmaceutical. RADIOPHARMACEUTICALS:  7.3 mCi Tc-27m Choletec IV COMPARISON:  05/03/2015 CT abdomen/ pelvis. FINDINGS: There is prompt radiotracer uptake by the liver with normal blood pool clearance. There is prompt radiotracer excretion into the common bile duct and small bowel beginning at 5 minutes. There is filling of the gallbladder with radiotracer beginning at 30 minutes, indicating cystic duct patency. There is no evidence of a bile leak. IMPRESSION: 1. Normal filling of the gallbladder with radiotracer, indicating cystic duct patency, which is not consistent with acute cholecystitis. 2. Prompt radiotracer excretion into the common bile duct and small bowel, with no evidence of biliary obstruction. 3. Normal liver radiotracer uptake and excretion. Electronically Signed   By: JIlona SorrelM.D.   On: 05/07/2015 09:00   Dg Chest Port 1 View  05/08/2015  CLINICAL DATA:  Several day history of productive cough, shortness of breath and dyspnea. EXAM: PORTABLE CHEST 1 VIEW COMPARISON:  05/03/2015 FINDINGS: The heart is upper limits of normal in size and stable given the AP projection. The mediastinal and hilar contours are normal. The lungs are clear. No pleural effusion. The bony thorax is intact. IMPRESSION: No acute cardiopulmonary findings. Electronically Signed   By: PMarijo SanesM.D.   On: 05/08/2015 10:00     Medical Consultants:  GI: Irene Shipper, MD  ID: Scharlene Gloss, MD  Anti-Infectives:   Anti-infectives    Start     Dose/Rate Route Frequency  Ordered Stop   05/07/15 0700  vancomycin (VANCOCIN) IVPB 1000 mg/200 mL premix  Status:  Discontinued     1,000 mg 200 mL/hr over 60 Minutes Intravenous Every 24 hours 05/07/15 0611 05/08/15 1438   05/04/15 1100  piperacillin-tazobactam (ZOSYN) IVPB 3.375 g  Status:  Discontinued     3.375 g 12.5 mL/hr over 240 Minutes Intravenous 3 times per day 05/04/15 1003 05/08/15 1438   05/04/15 0600  vancomycin (VANCOCIN) IVPB 750 mg/150 ml premix  Status:  Discontinued     750 mg 150 mL/hr over 60 Minutes Intravenous Every 24 hours 05/04/15 0557 05/07/15 0610   05/03/15 2200  clindamycin (CLEOCIN) IVPB 600 mg  Status:  Discontinued     600 mg 100 mL/hr over 30 Minutes Intravenous 3 times per day 05/03/15 1524 05/04/15 0225   05/03/15 1330  clindamycin (CLEOCIN) IVPB 600 mg     600 mg 100 mL/hr over 30 Minutes Intravenous  Once 05/03/15 1326 05/03/15 1457      Subjective:   [name] reports ongoing swelling in left hand and around foreskin of penis.  Some mild SOB, unchanged.  No N/V, tolerating diet.  Objective:    Filed Vitals:   05/08/15 0657 05/08/15 1336 05/08/15 2130 05/09/15 0512  BP: _0 94/57  Pulse: 95 66 87 92  Temp: 98.4 F (36.9 C) 98.8 F (37.1 C) 97.6 F (36.4 C) 100.3 F (37.9 C)  TempSrc: Oral Oral Oral Oral  Resp: _1 Height:      Weight:    76.3 kg (168 lb 3.4 oz)  SpO2: 100% 99% 100% 100%    Intake/Output Summary (Last 24 hours) at 05/09/15 0757 Last data filed at 05/08/15 2131  Gross per 24 hour  Intake      0 ml  Output    200 ml  Net   -200 ml   Filed Weights   05/07/15 0458 05/08/15 0500 05/09/15 0512  Weight: 75 kg (165 lb 5.5 oz) 77.5 kg (170 lb 13.7 oz) 76.3 kg (168 lb 3.4 oz)    Exam: Gen:  NAD Neck: Swelling improved. Cardiovascular:  RRR, No M/R/G Respiratory:  Lungs CTAB Gastrointestinal:  Abdomen soft, NT/ND, + BS GU: Swelling around foreskin, no sores/erythema Extremities:  Left hand swollen, +swelling  BLE, 1+   Data Reviewed:    Labs: Basic Metabolic Panel:  Recent Labs Lab 05/03/15 1542 05/04/15 0255 05/05/15 0525 05/06/15 0519 05/07/15 0457 05/08/15 0520  NA 131* 131* 132* 132* 134* 133*  K 4.4 4.9 4.2 4.3 4.2 4.5  CL 103 104 103 105 108 106  CO2 20* 20* 18* 17* 19* 20*  GLUCOSE 98 136* 88 69 83 116*  BUN 45* 50* 38* 33* 32* 33*  CREATININE 2.31* 2.36* 2.05* 2.10* 2.19* 2.35*  CALCIUM 8.0* 7.9* 8.3* 8.0* 8.1* 8.0*  MG 1.9  --   --   --   --   --   PHOS 3.8  --   --   --   --   --    GFR Estimated Creatinine Clearance: 31.9 mL/min (by C-G formula based on Cr of 2.35). Liver Function Tests:  Recent Labs Lab 05/03/15 1542 05/04/15 0255 05/06/15 0519 05/07/15 0457 05/08/15 0520  AST 226* 195* 547* 561* 517*  ALT 229* 218* 419* 461* 460*  ALKPHOS 78 93 155* 197* 237*  BILITOT 3.0* 2.7* 3.0* 3.2* 3.3*  PROT 5.5* 5.0* 4.7* 4.5* 4.9*  ALBUMIN 3.0* 2.8* 2.5* 2.4* 2.5*    Recent Labs Lab 05/03/15 1009  LIPASE 16*   CBC:  Recent Labs Lab 05/03/15 1009 05/03/15 1542 05/04/15 0255 05/05/15 0525 05/06/15 0519 05/08/15 0520  WBC 16.8* 17.4* 16.2* 15.4* 19.9* 23.8*  NEUTROABS 10.4* 11.4*  --   --   --   --   HGB 17.8* 14.4 13.4 14.1 12.2* 10.8*  HCT 51.4 41.7 39.3 42.1 35.6* 31.3*  MCV 99.8 100.2* 99.7 99.5 98.6 101.0*  PLT 253 230 225 209 199 213   BNP (last 3 results)  Recent Labs  04/13/15 1012  PROBNP 3040.00*   CBG:  Recent Labs Lab 05/06/15 0740 05/07/15 0908 05/07/15 1023 05/07/15 1723 05/08/15 0741  GLUCAP 45* 35* 121* 123* 68   Sepsis Labs:  Recent Labs Lab 05/04/15 0255 05/04/15 0541 05/04/15 1130 05/04/15 1400 05/05/15 0525 05/06/15 0519 05/07/15 1439 05/08/15 0520  PROCALCITON  --   --  3.23  --   --   --  1.86 1.56  WBC 16.2*  --   --   --  15.4* 19.9*  --  23.8*  LATICACIDVEN 2.7* 2.9* 3.6* 3.6*  --   --  2.6*  --    Microbiology Recent Results (from the past 240 hour(s))  Culture, blood (routine x 2)      Status: None (Preliminary result)   Collection Time: 05/04/15  2:50 AM  Result Value Ref Range Status   Specimen Description BLOOD RIGHT ARM  Final   Special Requests BOTTLES DRAWN AEROBIC AND ANAEROBIC [place]  Final   Culture   Final    NO GROWTH 3 DAYS Performed at [facility]    Report Status PENDING  Incomplete  Culture, blood (routine x 2)     Status: None (Preliminary result)   Collection Time: 05/04/15  2:55 AM  Result Value Ref Range Status   Specimen Description BLOOD RIGHT HAND  Final   Special Requests BOTTLES DRAWN AEROBIC ONLY 10CC  Final   Culture   Final    NO GROWTH 3 DAYS Performed at [facility]    Report Status PENDING  Incomplete  Culture, Urine     Status: None   Collection Time: 05/04/15 10:57 PM  Result Value Ref Range Status   Specimen Description URINE, RANDOM  Final   Special Requests NONE  Final   Culture   Final    NO GROWTH 1 DAY Performed at [facility]    Report Status 05/06/2015 FINAL  Final     Medications:   . aspirin EC  81 mg Oral Daily  . carvedilol  3.125 mg Oral BID WC  . clopidogrel  75 mg Oral Daily  . diphenhydrAMINE  25 mg Oral Q6H  . famotidine  20 mg Oral Daily  . magic mouthwash  5 mL Oral QID  . mirabegron ER  25 mg Oral Daily  . sodium chloride  250 mL Intravenous Once  . sodium chloride  3 mL Intravenous Q12H   Continuous Infusions:    Time spent: 25 minutes.    LOS: 5 days   [place] [facility] Pager [phone]. If unable to reach me by pager, please call my cell phone at [phone].  *Please refer to [email or web address], password [email or web address] to get updated schedule on who will round on this patient, as hospitalists switch teams weekly.  If 7PM-7AM, please contact night-coverage at [email or web address], password [email or web address] for any overnight needs.  05/09/2015, 7:57 AM

## 2015-05-10 DIAGNOSIS — R509 Fever, unspecified: Secondary | ICD-10-CM | POA: Insufficient documentation

## 2015-05-10 DIAGNOSIS — K1120 Sialoadenitis, unspecified: Secondary | ICD-10-CM

## 2015-05-10 DIAGNOSIS — L509 Urticaria, unspecified: Secondary | ICD-10-CM

## 2015-05-10 LAB — URINE MICROSCOPIC-ADD ON

## 2015-05-10 LAB — BASIC METABOLIC PANEL
Anion gap: 9 (ref 5–15)
BUN: 38 mg/dL — AB (ref 6–20)
CALCIUM: 8.5 mg/dL — AB (ref 8.9–10.3)
CO2: 21 mmol/L — AB (ref 22–32)
Chloride: 106 mmol/L (ref 101–111)
Creatinine, Ser: 2.77 mg/dL — ABNORMAL HIGH (ref 0.61–1.24)
GFR calc Af Amer: 27 mL/min — ABNORMAL LOW (ref >60)
GFR, EST NON AFRICAN AMERICAN: 23 mL/min — AB (ref >60)
GLUCOSE: 84 mg/dL (ref 65–99)
Potassium: 4.8 mmol/L (ref 3.5–5.1)
SODIUM: 136 mmol/L (ref 135–145)

## 2015-05-10 LAB — GLUCOSE, CAPILLARY
Glucose-Capillary: 47 mg/dL — ABNORMAL LOW (ref 65–99)
Glucose-Capillary: 92 mg/dL (ref 65–99)

## 2015-05-10 LAB — URINALYSIS, ROUTINE W REFLEX MICROSCOPIC
GLUCOSE, UA: NEGATIVE mg/dL
Ketones, ur: NEGATIVE mg/dL
NITRITE: POSITIVE — AB
PH: 5 (ref 5.0–8.0)
Protein, ur: 100 mg/dL — AB
SPECIFIC GRAVITY, URINE: 1.025 (ref 1.005–1.030)
Urobilinogen, UA: 1 mg/dL (ref 0.0–1.0)

## 2015-05-10 LAB — CBC
HCT: 30.7 % — ABNORMAL LOW (ref 39.0–52.0)
HEMOGLOBIN: 10.6 g/dL — AB (ref 13.0–17.0)
MCH: 35.8 pg — ABNORMAL HIGH (ref 26.0–34.0)
MCHC: 34.5 g/dL (ref 30.0–36.0)
MCV: 103.7 fL — ABNORMAL HIGH (ref 78.0–100.0)
PLATELETS: 221 10*3/uL (ref 150–400)
RBC: 2.96 MIL/uL — AB (ref 4.22–5.81)
RDW: 15.2 % (ref 11.5–15.5)
WBC: 23.5 10*3/uL — ABNORMAL HIGH (ref 4.0–10.5)

## 2015-05-10 LAB — SEDIMENTATION RATE: SED RATE: 10 mm/h (ref 0–16)

## 2015-05-10 LAB — C-REACTIVE PROTEIN: CRP: 9.7 mg/dL — AB (ref <1.0)

## 2015-05-10 MED ORDER — VITAMINS A & D EX OINT
TOPICAL_OINTMENT | CUTANEOUS | Status: AC
  Administered 2015-05-10: 20:00:00
  Filled 2015-05-10: qty 5

## 2015-05-10 NOTE — Progress Notes (Signed)
Progress Note   [name] GQB:[id number] DOB: 03-Feb-1956 DOA: 05/03/2015 PCP: Charolette Forward, MD   Brief Narrative:   [name] is an 59 y.o. male with a PMH of hypertension, chronic combined CHF, CAD, NSTEMI (on aspirin and plavix), CKD stage 3 who was admitted 05/03/15 with chief complaint of gradually worsening neck swelling. In ED, pt was hemodynamically stable. BP was 91/61, HR 92. Blood work revealed WBC count of 16.8, creatinine 2.26 (baseline 8 months ago 1.9). CT renal stone study showed no hydronephrosis or urolithiasis. LFT's were as follows: AST 299, ALT 283, TB 3.9. His recent abd [place] 04/30/15 showed mild gallbladder wall thickening probably attributable to nondistention/contraction. No gallstones. Other imaging studies included, CXR which showed no acute findings. X ray of the neck showed no acute findings. CT neck showed asymmetric enlargement of the right submandibular gland relative to the left, nonspecific but could be due to infection, inflammation or possibly tumor.   Assessment/Plan:   Principal Problem:   Sepsis due to cellulitis [facility]) with sepsis associated hypotension, neck swelling and leukocytosis - Sepsis criteria met 05/04/15 with hypotension, fever, tachycardia, leukocytosis and lactic acidosis. - Source of infection felt to be from neck cellulitis. Blood cultures negative to date. - Pt was given clindamycin on admission but this was changed to broad spectrum abx, vanco and zosyn for sepsis. - CT neck on admission demonstrated asymmetric enlargement of the right submandibular gland relative to the left. - MRI of the neck 05/04/15 consistent with cellulitis. No evidence of salivary gland mass or other abnormalities. - Blood cultures negative to date. Pro calcitonin elevation consistent with infection.  - Antibiotics discontinued 05/08/15 after ID evaluation. HIV, CMV, EBV all negative.  Active Problems:   R/O autoimmune disease - F/U ANA, anti-DS  ab, RF, anti CCP, ACE, SSA/SSAB, ANCA's ESR, CRP, Angiotensin Converting enzyme level.   Acute renal failure superimposed on stage 3 chronic kidney disease (HCC) - Baseline creatinine about 8 months ago 1.9. - Creatinine on this admission 2.26, possibly from lasix and lisinopril and both of those meds remain on hold. - CT renal stone protocol showed no hydronephrosis or urolithiasis.  - Provided with gentle hydration. Creatinine slowly trending back down and is almost back to baseline values.   Chronic combined systolic (congestive) and diastolic (congestive) heart failure (HCC) - Last 2 D ECHO in 12/2014 with EF of 20%. - Repeat chest x-ray 05/08/15: Clear.Judie Bonus, may need consult.   Dysphagia - Evaluated by speech therapy 05/04/15: Dysphagia 3 diet recommended.  Oral care TID.  MMW ordered. - Chest x-ray clear on admission.  Repeat, R/O aspiration PNA given ongoing WBC elevation.    Benign essential HTN - All blood pressure medicines currently on hold.   History of non-ST elevation myocardial infarction (NSTEMI) - Stable, no reports of chest pain.  - Sinus rhythm on 12 lead EKG. - Continue aspirin and plavix. Statin on hold secondary to elevated LFTs.   Hyponatremia - Likely due to CHF etiology. Sodium stable at 132.   Abnormal LFTs - Recent US done 10/13 showed no acute findings, no gallstones seen. - LFTs worse today. HIDA scan & acute hepatitis serologies negative.  - Holding Lipitor. - PT/INR reassuring, platelet count OK. - Evaluated by GI 05/08/15, no further GI evaluation recommended.    Polysubstance abuse / Noncompliance / Homelessness - SW consulted. Counseled regarding abstinence. - UDS and alcohol level WNL    DVT Prophylaxis  - SCD's bilaterally, aspirin, plavix.  Code Status: Full.  Family Communication: No family at the bedside. Disposition Plan: Home once source of persistent elevation of LFTs elucidated, SW to assist with  disposition given homelessness.  Will likely need several more days in the hospital, with ongoing work up being pursued, ID consultation assisting in further work up.  IV Access:    Peripheral IV   Procedures and diagnostic studies:   Ct Abdomen Pelvis Wo Contrast  05/08/2015  CLINICAL DATA:  Abdominal pain, elevated liver function studies. Sepsis and leukocytosis. EXAM: CT ABDOMEN AND PELVIS WITHOUT CONTRAST TECHNIQUE: Multidetector CT imaging of the abdomen and pelvis was performed following the standard protocol without IV contrast. COMPARISON:  CT scan 05/03/2015 and 04/23/2015 FINDINGS: Lower chest: The lung bases are clear. The heart is mildly enlarged. No pericardial effusion. The distal esophagus is grossly normal. Hepatobiliary: No focal hepatic lesions or intrahepatic biliary dilatation. The gallbladder is grossly normal. No common bile duct dilatation. Pancreas: No mass, inflammation or ductal dilatation. Spleen: Normal size.  No focal lesions. Adrenals/Urinary Tract: Stable nodularity of the left adrenal gland, likely small adenomas. The right adrenal gland is normal. Both kidneys are normal. No renal or obstructing ureteral calculi or bladder calculi. Stomach/Bowel: The stomach, duodenum, small bowel and colon are grossly normal. No inflammatory changes, mass lesions or obstructive findings. The terminal ileum is normal. The appendix is normal. Vascular/Lymphatic: No mesenteric or retroperitoneal mass or adenopathy. Scattered aortic calcifications but no aneurysm. Other: The prostate gland is mildly enlarged. This is stable. The bladder is normal. The the seminal vesicles are normal. No pelvic mass or adenopathy. No free pelvic fluid collections. No inguinal mass or adenopathy. Stable scattered borderline inguinal lymph nodes. Diffuse subcutaneous soft tissue swelling/ edema/fluid could suggest anasarca. No focal abscess. Musculoskeletal: No significant bony findings. IMPRESSION: 1. Diffuse  body wall edema suggesting anasarca. 2. Mesenteric edema but no overt ascites. 3. No acute intra-abdominal/abnormalities. 4. Stable prostate gland enlargement and small hiatal hernia. Electronically Signed   By: Marijo Sanes M.D.   On: 05/08/2015 13:59   Nm Hepatobiliary Including Gb  05/07/2015  CLINICAL DATA:  59 year old male in patient with 2 weeks of abdominal pain. Elevated liver function tests. EXAM: NUCLEAR MEDICINE HEPATOBILIARY IMAGING TECHNIQUE: Sequential images of the abdomen were obtained out to 60 minutes following intravenous administration of radiopharmaceutical. RADIOPHARMACEUTICALS:  7.3 mCi Tc-68m Choletec IV COMPARISON:  05/03/2015 CT abdomen/ pelvis. FINDINGS: There is prompt radiotracer uptake by the liver with normal blood pool clearance. There is prompt radiotracer excretion into the common bile duct and small bowel beginning at 5 minutes. There is filling of the gallbladder with radiotracer beginning at 30 minutes, indicating cystic duct patency. There is no evidence of a bile leak. IMPRESSION: 1. Normal filling of the gallbladder with radiotracer, indicating cystic duct patency, which is not consistent with acute cholecystitis. 2. Prompt radiotracer excretion into the common bile duct and small bowel, with no evidence of biliary obstruction. 3. Normal liver radiotracer uptake and excretion. Electronically Signed   By: JIlona SorrelM.D.   On: 05/07/2015 09:00   Dg Chest Port 1 View  05/08/2015  CLINICAL DATA:  Several day history of productive cough, shortness of breath and dyspnea. EXAM: PORTABLE CHEST 1 VIEW COMPARISON:  05/03/2015 FINDINGS: The heart is upper limits of normal in size and stable given the AP projection. The mediastinal and hilar contours are normal. The lungs are clear. No pleural effusion. The bony thorax is intact. IMPRESSION: No acute cardiopulmonary findings. Electronically Signed  By: Marijo Sanes M.D.   On: 05/08/2015 10:00     Medical Consultants:      GI: Irene Shipper, MD  ID: Scharlene Gloss, MD  Anti-Infectives:   Anti-infectives    Start     Dose/Rate Route Frequency Ordered Stop   05/07/15 0700  vancomycin (VANCOCIN) IVPB 1000 mg/200 mL premix  Status:  Discontinued     1,000 mg 200 mL/hr over 60 Minutes Intravenous Every 24 hours 05/07/15 0611 05/08/15 1438   05/04/15 1100  piperacillin-tazobactam (ZOSYN) IVPB 3.375 g  Status:  Discontinued     3.375 g 12.5 mL/hr over 240 Minutes Intravenous 3 times per day 05/04/15 1003 05/08/15 1438   05/04/15 0600  vancomycin (VANCOCIN) IVPB 750 mg/150 ml premix  Status:  Discontinued     750 mg 150 mL/hr over 60 Minutes Intravenous Every 24 hours 05/04/15 0557 05/07/15 0610   05/03/15 2200  clindamycin (CLEOCIN) IVPB 600 mg  Status:  Discontinued     600 mg 100 mL/hr over 30 Minutes Intravenous 3 times per day 05/03/15 1524 05/04/15 0225   05/03/15 1330  clindamycin (CLEOCIN) IVPB 600 mg     600 mg 100 mL/hr over 30 Minutes Intravenous  Once 05/03/15 1326 05/03/15 1457      Subjective:   [name] reports ongoing swelling in left hand but decreased swelling around penis. He now has an itchy skin eruption with desquamation.  Some mild SOB, unchanged.  No N/V, tolerating diet.  Objective:    Filed Vitals:   05/09/15 1500 05/09/15 1515 05/09/15 2157 05/10/15 0511  BP: 116/79  91/58 102/62  Pulse: 87 110 85 74  Temp: 99.1 F (37.3 C)  98.3 F (36.8 C) 97.6 F (36.4 C)  TempSrc: Oral  Oral Oral  Resp: '18  20 20  ' Height:      Weight:    78 kg (171 lb 15.3 oz)  SpO2: 100%  100% 100%    Intake/Output Summary (Last 24 hours) at 05/10/15 0802 Last data filed at 05/09/15 2300  Gross per 24 hour  Intake    100 ml  Output      0 ml  Net    100 ml   Filed Weights   05/08/15 0500 05/09/15 0512 05/10/15 0511  Weight: 77.5 kg (170 lb 13.7 oz) 76.3 kg (168 lb 3.4 oz) 78 kg (171 lb 15.3 oz)    Exam: Gen:  NAD Cardiovascular:  RRR, No M/R/G Respiratory:  Lungs  CTAB Gastrointestinal:  Abdomen soft, NT/ND, + BS GU: Swelling around foreskin decreased, no sores/erythema Extremities:  Left hand swollen, +swelling BLE, 1+ Skin: No erythema, dermis dry with desquamation noted.   Data Reviewed:    Labs: Basic Metabolic Panel:  Recent Labs Lab 05/03/15 1542  05/06/15 0519 05/07/15 0457 05/08/15 0520 05/09/15 0559 05/10/15 0540  NA 131*  < > 132* 134* 133* 134* 136  K 4.4  < > 4.3 4.2 4.5 4.9 4.8  CL 103  < > 105 108 106 105 106  CO2 20*  < > 17* 19* 20* 20* 21*  GLUCOSE 98  < > 69 83 116* 70 84  BUN 45*  < > 33* 32* 33* 34* 38*  CREATININE 2.31*  < > 2.10* 2.19* 2.35* 2.47* 2.77*  CALCIUM 8.0*  < > 8.0* 8.1* 8.0* 8.2* 8.5*  MG 1.9  --   --   --   --   --   --   PHOS 3.8  --   --   --   --   --   --   < > =  values in this interval not displayed. GFR Estimated Creatinine Clearance: 27.1 mL/min (by C-G formula based on Cr of 2.77). Liver Function Tests:  Recent Labs Lab 05/04/15 0255 05/06/15 0519 05/07/15 0457 05/08/15 0520 05/09/15 0559  AST 195* 547* 561* 517* 599*  ALT 218* 419* 461* 460* 510*  ALKPHOS 93 155* 197* 237* 241*  BILITOT 2.7* 3.0* 3.2* 3.3* 3.9*  PROT 5.0* 4.7* 4.5* 4.9* 5.3*  ALBUMIN 2.8* 2.5* 2.4* 2.5* 2.6*    Recent Labs Lab 05/03/15 1009  LIPASE 16*   CBC:  Recent Labs Lab 05/03/15 1009 05/03/15 1542  05/05/15 0525 05/06/15 0519 05/08/15 0520 05/09/15 0559 05/10/15 0540  WBC 16.8* 17.4*  < > 15.4* 19.9* 23.8* 26.8* 23.5*  NEUTROABS 10.4* 11.4*  --   --   --   --   --   --   HGB 17.8* 14.4  < > 14.1 12.2* 10.8* 10.9* 10.6*  HCT 51.4 41.7  < > 42.1 35.6* 31.3* 31.2* 30.7*  MCV 99.8 100.2*  < > 99.5 98.6 101.0* 103.7* 103.7*  PLT 253 230  < > 209 199 213 229 221  < > = values in this interval not displayed. BNP (last 3 results)  Recent Labs  04/13/15 1012  PROBNP 3040.00*   CBG:  Recent Labs Lab 05/07/15 1023 05/07/15 1723 05/08/15 0741 05/09/15 0855 05/10/15 0729  GLUCAP 121*  123* 68 57* 47*   Sepsis Labs:  Recent Labs Lab 05/04/15 0541 05/04/15 1130 05/04/15 1400  05/06/15 0519 05/07/15 1439 05/08/15 0520 05/09/15 0539 05/09/15 0559 05/10/15 0540  PROCALCITON  --  3.23  --   --   --  1.86 1.56 1.85  --   --   WBC  --   --   --   < > 19.9*  --  23.8*  --  26.8* 23.5*  LATICACIDVEN 2.9* 3.6* 3.6*  --   --  2.6*  --   --   --   --   < > = values in this interval not displayed. Microbiology Recent Results (from the past 240 hour(s))  Culture, blood (routine x 2)     Status: None   Collection Time: 05/04/15  2:50 AM  Result Value Ref Range Status   Specimen Description BLOOD RIGHT ARM  Final   Special Requests BOTTLES DRAWN AEROBIC AND ANAEROBIC [place]  Final   Culture   Final    NO GROWTH 5 DAYS Performed at [facility]    Report Status 05/09/2015 FINAL  Final  Culture, blood (routine x 2)     Status: None   Collection Time: 05/04/15  2:55 AM  Result Value Ref Range Status   Specimen Description BLOOD RIGHT HAND  Final   Special Requests BOTTLES DRAWN AEROBIC ONLY 10CC  Final   Culture   Final    NO GROWTH 5 DAYS Performed at [facility]    Report Status 05/09/2015 FINAL  Final  Culture, Urine     Status: None   Collection Time: 05/04/15 10:57 PM  Result Value Ref Range Status   Specimen Description URINE, RANDOM  Final   Special Requests NONE  Final   Culture   Final    NO GROWTH 1 DAY Performed at [facility]    Report Status 05/06/2015 FINAL  Final     Medications:   . aspirin EC  81 mg Oral Daily  . clopidogrel  75 mg Oral Daily  . diphenhydrAMINE  25 mg Oral  Q6H  . famotidine  20 mg Oral Daily  . magic mouthwash  5 mL Oral QID  . mirabegron ER  25 mg Oral Daily  . sodium chloride  250 mL Intravenous Once  . sodium chloride  3 mL Intravenous Q12H   Continuous Infusions:    Time spent: 25 minutes.    LOS: 6 days   [place] [facility] Pager [phone]. If unable to reach me by  pager, please call my cell phone at [phone].  *Please refer to [email or web address], password [email or web address] to get updated schedule on who will round on this patient, as hospitalists switch teams weekly. If 7PM-7AM, please contact night-coverage at [email or web address], password [email or web address] for any overnight needs.  05/10/2015, 8:02 AM

## 2015-05-10 NOTE — Progress Notes (Signed)
[place] for Infectious Disease    Subjective: No new complaints,   Antibiotics:  Anti-infectives    Start     Dose/Rate Route Frequency Ordered Stop   05/07/15 0700  vancomycin (VANCOCIN) IVPB 1000 mg/200 mL premix  Status:  Discontinued     1,000 mg 200 mL/hr over 60 Minutes Intravenous Every 24 hours 05/07/15 0611 05/08/15 1438   05/04/15 1100  piperacillin-tazobactam (ZOSYN) IVPB 3.375 g  Status:  Discontinued     3.375 g 12.5 mL/hr over 240 Minutes Intravenous 3 times per day 05/04/15 1003 05/08/15 1438   05/04/15 0600  vancomycin (VANCOCIN) IVPB 750 mg/150 ml premix  Status:  Discontinued     750 mg 150 mL/hr over 60 Minutes Intravenous Every 24 hours 05/04/15 0557 05/07/15 0610   05/03/15 2200  clindamycin (CLEOCIN) IVPB 600 mg  Status:  Discontinued     600 mg 100 mL/hr over 30 Minutes Intravenous 3 times per day 05/03/15 1524 05/04/15 0225   05/03/15 1330  clindamycin (CLEOCIN) IVPB 600 mg     600 mg 100 mL/hr over 30 Minutes Intravenous  Once 05/03/15 1326 05/03/15 1457      Medications: Scheduled Meds: . aspirin EC  81 mg Oral Daily  . clopidogrel  75 mg Oral Daily  . diphenhydrAMINE  25 mg Oral Q6H  . famotidine  20 mg Oral Daily  . magic mouthwash  5 mL Oral QID  . mirabegron ER  25 mg Oral Daily  . sodium chloride  250 mL Intravenous Once  . sodium chloride  3 mL Intravenous Q12H   Continuous Infusions:  PRN Meds:.acetaminophen **OR** acetaminophen, colchicine, ondansetron **OR** ondansetron (ZOFRAN) IV, technetium TC 107M mebrofenin, traMADol    Objective: Weight change: 3 lb 12 oz (1.7 kg)  Intake/Output Summary (Last 24 hours) at 05/10/15 1914 Last data filed at 05/10/15 1812  Gross per 24 hour  Intake    760 ml  Output      0 ml  Net    760 ml   Blood pressure 91/63, pulse 103, temperature 100.3 F (37.9 C), temperature source Oral, resp. rate 20, height '5\' 8"'  (1.727 m), weight 171 lb 15.3 oz (78 kg), SpO2 98  %. Temp:  [97.6 F (36.4 C)-100.3 F (37.9 C)] 100.3 F (37.9 C) (10/23 1502) Pulse Rate:  [74-103] 103 (10/23 1502) Resp:  [20] 20 (10/23 1502) BP: (91-102)/(58-63) 91/63 mmHg (10/23 1502) SpO2:  [98 %-100 %] 98 % (10/23 1502) Weight:  [171 lb 15.3 oz (78 kg)] 171 lb 15.3 oz (78 kg) (10/23 0511)  Physical Exam: General: Alert and awake, oriented x3, not in any acute distress. HEENT: anicteric sclera, pupils reactive to light and accommodation, EOMI Neck few LN, no clear tenderness CVS regular rate, normal r,  no murmur rubs or gallops Chest: clear to auscultation bilaterally, no wheezing, rales or rhonchi Abdomen: soft nontender, nondistended, normal bowel sounds, Extremities: no  clubbing or edema noted bilaterally  Skin:   05/09/15: Scaling diffuse rash        Neuro: nonfocal  CBC: CBC Latest Ref Rng 05/10/2015 05/09/2015 05/08/2015  WBC 4.0 - 10.5 K/uL 23.5(H) 26.8(H) 23.8(H)  Hemoglobin 13.0 - 17.0 g/dL 10.6(L) 10.9(L) 10.8(L)  Hematocrit 39.0 - 52.0 % 30.7(L) 31.2(L) 31.3(L)  Platelets 150 - 400 K/uL 221 229 213       BMET  Recent Labs  05/09/15 0559 05/10/15 0540  NA 134* 136  K  4.9 4.8  CL 105 106  CO2 20* 21*  GLUCOSE 70 84  BUN 34* 38*  CREATININE 2.47* 2.77*  CALCIUM 8.2* 8.5*     Liver Panel   Recent Labs  05/08/15 0520 05/09/15 0559  PROT 4.9* 5.3*  ALBUMIN 2.5* 2.6*  AST 517* 599*  ALT 460* 510*  ALKPHOS 237* 241*  BILITOT 3.3* 3.9*       Sedimentation Rate  Recent Labs  05/10/15 0540  ESRSEDRATE 10   C-Reactive Protein  Recent Labs  05/10/15 0540  CRP 9.7*    Micro Results: Recent Results (from the past 720 hour(s))  Culture, blood (routine x 2)     Status: None   Collection Time: 05/04/15  2:50 AM  Result Value Ref Range Status   Specimen Description BLOOD RIGHT ARM  Final   Special Requests BOTTLES DRAWN AEROBIC AND ANAEROBIC [place]  Final   Culture   Final    NO GROWTH 5 DAYS Performed at [facility]    Report Status 05/09/2015 FINAL  Final  Culture, blood (routine x 2)     Status: None   Collection Time: 05/04/15  2:55 AM  Result Value Ref Range Status   Specimen Description BLOOD RIGHT HAND  Final   Special Requests BOTTLES DRAWN AEROBIC ONLY 10CC  Final   Culture   Final    NO GROWTH 5 DAYS Performed at [facility]    Report Status 05/09/2015 FINAL  Final  Culture, Urine     Status: None   Collection Time: 05/04/15 10:57 PM  Result Value Ref Range Status   Specimen Description URINE, RANDOM  Final   Special Requests NONE  Final   Culture   Final    NO GROWTH 1 DAY Performed at [facility]    Report Status 05/06/2015 FINAL  Final    Studies/Results: No results found.    Assessment/Plan:  INTERVAL HISTORY:  05/08/15: antibiotics stopped   Principal Problem:   Sepsis due to cellulitis [facility]) Active Problems:   Homelessness   Neck swelling   Acute renal failure superimposed on stage 3 chronic kidney disease (HCC)   Chronic combined systolic (congestive) and diastolic (congestive) heart failure (HCC)   Benign essential HTN   History of non-ST elevation myocardial infarction (NSTEMI)   Hyponatremia   Abnormal LFTs   Polysubstance abuse   Noncompliance   Leukocytosis   Cellulitis of neck   Dysphagia   Elevated LFTs   Submandibular gland infection   Urticaria   Rash and nonspecific skin eruption   Dry mouth    [name] is a 59 y.o. male with  Hx of CHF, HTN, polysubstance abuse with main problem being crack cocaine who was admitted with neck pain, swelling (note he had been seen for same complaint on 03/20/15 when he had a different CT neck, admitted with concern for sepsis, ad,otted and placed on vancomycin, clindamycin, then vancomycin and zosyn with persistent temperatures and leukocytosis. CT neck, MRI neck, CT abdomen and pelvis have not disclosed clear cause of fevers high WBC. EBV and CMV IgM's negative.  He has been  taken off abx since there is no target for them  He DOES have a rash which could be cluse to underlying problem along with his dry mouth, ? Parotid irritation, high WBC  The following AI labs are pending: ANA, anti-DS ab, RF, anti CCP, ACE, SSA/SSAB, ANCA's ESR, CRP. Angiotensin Converting enzyme level  I wonder with his cocaine  use if levamisole induced vasculitis could be what is going on though his tox screen in clean and he claims to have been clean for several months perhaps he had levamisole induced vasculitis  With cocaine use in last month and it is persisting despite lack of cocaine on drug screen  A punch biopsy of his rash might also be informative.   I would also stongly consider a bone marrow biopsy and send for pathology, AFB, fungal, bacterial cultures.  Dr. Linus Salmons is back tomorrow.      LOS: 6 days   Alcide Evener 05/10/2015, 7:14 PM

## 2015-05-11 ENCOUNTER — Ambulatory Visit (HOSPITAL_COMMUNITY): Admission: RE | Admit: 2015-05-11 | Payer: Medicaid Other | Source: Ambulatory Visit

## 2015-05-11 LAB — ANTI-SCLERODERMA ANTIBODY

## 2015-05-11 LAB — CYCLIC CITRUL PEPTIDE ANTIBODY, IGG/IGA: CCP Antibodies IgG/IgA: 9 units (ref 0–19)

## 2015-05-11 LAB — ANTI-DNA ANTIBODY, DOUBLE-STRANDED: ds DNA Ab: 1 IU/mL (ref 0–9)

## 2015-05-11 LAB — GLUCOSE, CAPILLARY: GLUCOSE-CAPILLARY: 67 mg/dL (ref 65–99)

## 2015-05-11 LAB — RHEUMATOID FACTOR: Rhuematoid fact SerPl-aCnc: <10 IU/mL (ref 0.0–13.9)

## 2015-05-11 LAB — SJOGRENS SYNDROME-A EXTRACTABLE NUCLEAR ANTIBODY: SSA (Ro) (ENA) Antibody, IgG: <0.2 AI (ref 0.0–0.9)

## 2015-05-11 LAB — LACTATE DEHYDROGENASE: LDH: 582 U/L — ABNORMAL HIGH (ref 98–192)

## 2015-05-11 LAB — SJOGRENS SYNDROME-B EXTRACTABLE NUCLEAR ANTIBODY: SSB (La) (ENA) Antibody, IgG: <0.2 AI (ref 0.0–0.9)

## 2015-05-11 MED ORDER — VITAMINS A & D EX OINT
TOPICAL_OINTMENT | CUTANEOUS | Status: AC
  Administered 2015-05-11: 11:00:00
  Filled 2015-05-11: qty 5

## 2015-05-11 NOTE — Progress Notes (Addendum)
[place] for Infectious Disease   Date of Admission:  05/03/2015  Antibiotics: none  Subjective: No new complaints  Objective: Temp:  [98 F (36.7 C)-100.3 F (37.9 C)] 98 F (36.7 C) (10/24 0546) Pulse Rate:  [94-113] 113 (10/24 0546) Resp:  [20] 20 (10/24 0546) BP: (91-119)/(52-63) 119/63 mmHg (10/24 0546) SpO2:  [98 %-100 %] 100 % (10/24 0546) Weight:  [177 lb 7.5 oz (80.5 kg)] 177 lb 7.5 oz (80.5 kg) (10/24 0546)  General: awake, alert, nad Skin: same dry rash Lungs: CTA Cor: RRR Abdomen: soft, nt Ext: no edema  A comprehensive review of systems was negative except for: Constitutional: positive for fatigue  Lab Results Lab Results  Component Value Date   WBC 23.5* 05/10/2015   HGB 10.6* 05/10/2015   HCT 30.7* 05/10/2015   MCV 103.7* 05/10/2015   PLT 221 05/10/2015    Lab Results  Component Value Date   CREATININE 2.77* 05/10/2015   BUN 38* 05/10/2015   NA 136 05/10/2015   K 4.8 05/10/2015   CL 106 05/10/2015   CO2 21* 05/10/2015    Lab Results  Component Value Date   ALT 510* 05/09/2015   AST 599* 05/09/2015   ALKPHOS 241* 05/09/2015   BILITOT 3.9* 05/09/2015      Microbiology: Recent Results (from the past 240 hour(s))  Culture, blood (routine x 2)     Status: None   Collection Time: 05/04/15  2:50 AM  Result Value Ref Range Status   Specimen Description BLOOD RIGHT ARM  Final   Special Requests BOTTLES DRAWN AEROBIC AND ANAEROBIC [place]  Final   Culture   Final    NO GROWTH 5 DAYS Performed at [facility]    Report Status 05/09/2015 FINAL  Final  Culture, blood (routine x 2)     Status: None   Collection Time: 05/04/15  2:55 AM  Result Value Ref Range Status   Specimen Description BLOOD RIGHT HAND  Final   Special Requests BOTTLES DRAWN AEROBIC ONLY 10CC  Final   Culture   Final    NO GROWTH 5 DAYS Performed at [facility]    Report Status 05/09/2015 FINAL  Final  Culture, Urine     Status: None   Collection  Time: 05/04/15 10:57 PM  Result Value Ref Range Status   Specimen Description URINE, RANDOM  Final   Special Requests NONE  Final   Culture   Final    NO GROWTH 1 DAY Performed at [facility]    Report Status 05/06/2015 FINAL  Final    Studies/Results: No results found.  Assessment/Plan:  1) leukocytosis - has remained afebrile, off of antibiotics.  No etiology.  Work up for autoimmune labs done.  I agree with bone marrow biopsy.  I ordered it under bone marrow biopsy.  LDH high.   ?leukemia. I also would like the bone marrow sent for AFB smear and cultures, fungal smear and cultures.    2) eosinophils - non specific but could be related to cancer.  No evidence of parasite though will send for strongyloides antibody.   Will check diff tomorrow.   3) transaminitis - seems likely related to #1.  Will recheck tomorrow.   Scharlene Gloss, [place] for Infectious Disease [place] [email or web address] [phone] pager   [phone] cell 05/11/2015, 12:37 PM

## 2015-05-11 NOTE — Consult Note (Signed)
Chief Complaint: Patient was seen in consultation today for CT-guided bone marrow biopsy Chief Complaint  Patient presents with  . Allergic Reaction    Referring Physician(s): Comer/Rama  History of Present Illness: [name] is a 59 y.o. male with past medical history significant for hypertension, CHF, cardiac artery disease with prior non-STEMI, polysubstance abuse, ischemic dilated cardiomyopathy, chronic kidney disease, recently admitted with history of rash, abdominal pain, neck swelling  and general malaise. Follow-up imaging revealed some nonspecific subcutaneous edema involving bilateral submandibular spaces, negative chest x-ray, negative nuclear medicine hepatobiliary imaging, and CT with diffuse body wall edema no acute intra-abdominal process. Blood and urine cultures are negative. Laboratory studies revealed WBC of 23.5, hemoglobin 10.6, platelets 221k , creatinine  2.77 and elevated LFTs. Due to leukocytosis and eosinophilia of unknown etiology request has now been received for CT-guided bone marrow biopsy to include AFB smear and culture as well as fungal smear and cultures.  Past Medical History  Diagnosis Date  . Hypertension   . Homelessness   . Urinary hesitancy   . Hypercholesterolemia   . NSTEMI (non-ST elevated myocardial infarction) ([place]) 01/07/2015  . Heart attack ([place]) 07/2014  . Walking pneumonia 07/2014  . Headache     "probably weekly" (01/07/2015)  . Arthritis     "all over" (01/07/2015)  . Anxiety   . Depression   . Noncompliance   . Polysubstance abuse     etoh, cocaine  . Ischemic dilated cardiomyopathy   . CKD (chronic kidney disease), stage IV ([place])   . History of echocardiogram 12/2014    EF 20-25%    Past Surgical History  Procedure Laterality Date  . Left heart catheterization with coronary angiogram N/A 08/15/2014    Procedure: LEFT HEART CATHETERIZATION WITH CORONARY ANGIOGRAM;  Surgeon: Clent Demark, MD;  Location: [facility] CATH LAB;   Service: Cardiovascular;  Laterality: N/A;  . Cardiac catheterization      Allergies: Review of patient's allergies indicates no known allergies.  Medications: Prior to Admission medications   Medication Sig Start Date End Date Taking? Authorizing Provider  aspirin EC 81 MG tablet Take 81 mg by mouth daily.   Yes Historical Provider, MD  atorvastatin (LIPITOR) 80 MG tablet Take 80 mg by mouth daily.   Yes Historical Provider, MD  carvedilol (COREG) 3.125 MG tablet Take 1 tablet (3.125 mg total) by mouth 2 (two) times daily with a meal. Discontinue 6.11m 03/24/15  Yes EArnoldo Morale MD  clopidogrel (PLAVIX) 75 MG tablet Take 1 tablet (75 mg total) by mouth daily. 01/09/15  Yes MCharolette Forward MD  colchicine 0.6 MG tablet Take 2 tabs (1.234m PO at the onset of a gout attack, may repeat 1 tab(0.86m43man hour later, max 1.8mg47mtient taking differently: Take 1.2 mg by mouth daily as needed (gout). Take 2 tabs (1.2mg)22m at the onset of a gout attack, may repeat 1 tab(0.86mg) 286mhour later, max 1.8mg 9/68m6  Yes EnobongArnoldo MoraleiphenhydrAMINE (BENADRYL) 25 MG tablet Take 1 tablet (25 mg total) by mouth every 6 (six) hours. 04/30/15 05/03/15 Yes Erin ScGareth Morganurosemide (LASIX) 40 MG tablet Take 1.5 tablets (60 mg total) by mouth 2 (two) times daily. 03/31/15  Yes EnobongArnoldo Moralesosorbide mononitrate (IMDUR) 30 MG 24 hr tablet Take 15-30 mg by mouth daily.    Yes Historical Provider, MD  isosorbide-hydrALAZINE (BIDIL) 20-37.5 MG per tablet Take 1 tablet by mouth 2 (two) times daily. 03/10/15  Yes  Charolette Forward, MD  mirabegron ER (MYRBETRIQ) 25 MG TB24 tablet Take 25 mg by mouth daily.   Yes Historical Provider, MD  nitroGLYCERIN (NITROSTAT) 0.4 MG SL tablet Place 1 tablet (0.4 mg total) under the tongue every 5 (five) minutes x 3 doses as needed for chest pain. 08/16/14  Yes Dixie Dials, MD  ramipril (ALTACE) 1.25 MG capsule Take 1.25 mg by mouth daily.   Yes Historical Provider, MD    ranitidine (ZANTAC) 150 MG capsule Take 1 capsule (150 mg total) by mouth 2 (two) times daily. 04/30/15  Yes Gareth Morgan, MD  traMADol (ULTRAM) 50 MG tablet Take 1 tablet (50 mg total) by mouth every 12 (twelve) hours as needed. Patient taking differently: Take 50 mg by mouth every 12 (twelve) hours as needed for moderate pain.  03/31/15  Yes [name], MD  allopurinol (ZYLOPRIM) 300 MG tablet Take 1 tablet (300 mg total) by mouth daily. Patient not taking: Reported on 05/03/2015 03/24/15   [name], MD  atorvastatin (LIPITOR) 20 MG tablet Take 1 tablet (20 mg total) by mouth daily. Patient not taking: Reported on 04/30/2015 09/07/14   Charolette Forward, MD  digoxin (LANOXIN) 0.25 MG tablet Take 1 tablet (0.25 mg total) by mouth daily. Patient not taking: Reported on 05/03/2015 09/07/14   Charolette Forward, MD     History reviewed. No pertinent family history.  Social History   Social History  . Marital Status: Single    Spouse Name: N/A  . Number of Children: N/A  . Years of Education: N/A   Social History Main Topics  . Smoking status: Former Smoker -- 1.50 packs/day for 30 years    Types: Cigarettes    Quit date: 02/19/2004  . Smokeless tobacco: Never Used  . Alcohol Use: Yes     Comment: social  . Drug Use: No     Comment: former  . Sexual Activity: No     Comment: crack   Other Topics Concern  . None   Social History Narrative      Review of Systems  Constitutional: Positive for fatigue. Negative for fever and chills.  Respiratory:       Occasional cough, dyspnea with exertion  Cardiovascular: Positive for leg swelling. Negative for chest pain.  Gastrointestinal: Positive for abdominal pain. Negative for nausea and vomiting.  Genitourinary: Positive for hematuria. Negative for dysuria.  Musculoskeletal: Positive for neck pain. Negative for back pain.  Neurological:       Occasional headaches    Vital Signs: BP 98/69 mmHg  Pulse 80  Temp(Src) 98.2 F (36.8  C) (Oral)  Resp 20  Ht '5\' 8"'  (1.727 m)  Wt 177 lb 7.5 oz (80.5 kg)  BMI 26.99 kg/m2  SpO2 100%  Physical Exam  Constitutional: He is oriented to person, place, and time. He appears well-developed and well-nourished.  Cardiovascular: Normal rate and regular rhythm.   Pulmonary/Chest: Effort normal and breath sounds normal.  Abdominal: Soft. Bowel sounds are normal. There is tenderness.  Musculoskeletal: Normal range of motion. He exhibits edema.  Neurological: He is alert and oriented to person, place, and time.    Mallampati Score:     Imaging: Ct Abdomen Pelvis Wo Contrast  05/08/2015  CLINICAL DATA:  Abdominal pain, elevated liver function studies. Sepsis and leukocytosis. EXAM: CT ABDOMEN AND PELVIS WITHOUT CONTRAST TECHNIQUE: Multidetector CT imaging of the abdomen and pelvis was performed following the standard protocol without IV contrast. COMPARISON:  CT scan 05/03/2015 and 04/23/2015 FINDINGS: Lower chest:  The lung bases are clear. The heart is mildly enlarged. No pericardial effusion. The distal esophagus is grossly normal. Hepatobiliary: No focal hepatic lesions or intrahepatic biliary dilatation. The gallbladder is grossly normal. No common bile duct dilatation. Pancreas: No mass, inflammation or ductal dilatation. Spleen: Normal size.  No focal lesions. Adrenals/Urinary Tract: Stable nodularity of the left adrenal gland, likely small adenomas. The right adrenal gland is normal. Both kidneys are normal. No renal or obstructing ureteral calculi or bladder calculi. Stomach/Bowel: The stomach, duodenum, small bowel and colon are grossly normal. No inflammatory changes, mass lesions or obstructive findings. The terminal ileum is normal. The appendix is normal. Vascular/Lymphatic: No mesenteric or retroperitoneal mass or adenopathy. Scattered aortic calcifications but no aneurysm. Other: The prostate gland is mildly enlarged. This is stable. The bladder is normal. The the seminal  vesicles are normal. No pelvic mass or adenopathy. No free pelvic fluid collections. No inguinal mass or adenopathy. Stable scattered borderline inguinal lymph nodes. Diffuse subcutaneous soft tissue swelling/ edema/fluid could suggest anasarca. No focal abscess. Musculoskeletal: No significant bony findings. IMPRESSION: 1. Diffuse body wall edema suggesting anasarca. 2. Mesenteric edema but no overt ascites. 3. No acute intra-abdominal/abnormalities. 4. Stable prostate gland enlargement and small hiatal hernia. Electronically Signed   By: Marijo Sanes M.D.   On: 05/08/2015 13:59   Dg Neck Soft Tissue  05/03/2015  CLINICAL DATA:  Neck pain and swelling. EXAM: NECK SOFT TISSUES - 1+ VIEW COMPARISON:  None. FINDINGS: There is no evidence of retropharyngeal soft tissue swelling or epiglottic enlargement. The cervical airway is unremarkable and no radio-opaque foreign body identified. Cervical spine degenerative changes noted. IMPRESSION: No acute findings. Electronically Signed   By: Earle Gell M.D.   On: 05/03/2015 11:06   Dg Chest 2 View  05/03/2015  CLINICAL DATA:  Pt c/o productive cough, SOB and weakness x 2days. HTN, former smoker (quit in 2005). Hx of MI and pneumonia EXAM: CHEST  2 VIEW COMPARISON:  Chest radiograph of 04/30/2015 FINDINGS: Midline trachea. Normal heart size and mediastinal contours. No pleural effusion or pneumothorax. Clear lungs. IMPRESSION: No acute cardiopulmonary disease. Electronically Signed   By: Abigail Miyamoto M.D.   On: 05/03/2015 11:06   Dg Chest 2 View  04/30/2015  CLINICAL DATA:  Cough, sore throat for 2 days EXAM: CHEST  2 VIEW COMPARISON:  03/22/2015 FINDINGS: There is no focal parenchymal opacity. There is no pleural effusion or pneumothorax. There is stable cardiomegaly. The osseous structures are unremarkable. IMPRESSION: No active cardiopulmonary disease. Electronically Signed   By: Kathreen Devoid   On: 04/30/2015 15:35   Ct Soft Tissue Neck Wo  Contrast  05/03/2015  CLINICAL DATA:  Throat swelling and productive cough, worsening. Facial swelling. EXAM: CT NECK WITHOUT CONTRAST TECHNIQUE: Multidetector CT imaging of the neck was performed following the standard protocol without intravenous contrast. COMPARISON:  None. FINDINGS: Lack of IV contrast material limits the exam. There is diffuse infiltration of subcutaneous fatty tissues which may be due to edema or less likely cellulitis. The right submandibular gland is asymmetrically enlarged relative to the left with subtle low attenuation within it and mild haziness of its borders. Major salivary glands are otherwise unremarkable. No lymphadenopathy is seen. No abscess is identified. The lung apices are clear. No lytic or sclerotic bony lesion is seen. Cervical spondylosis is noted. IMPRESSION: Asymmetric enlargement of the right submandibular gland relative to the left is nonspecific but could be due to infection, inflammation or possibly tumor. MRI of the neck  with contrast if possible after the patient's acute episode has passed is recommended for further evaluation. Negative for abscess. Diffuse subcutaneous edema is nonspecific and likely due to anasarca rather than cellulitis. Electronically Signed   By: Inge Rise M.D.   On: 05/03/2015 13:18   Nm Hepatobiliary Including Gb  05/07/2015  CLINICAL DATA:  59 year old male in patient with 2 weeks of abdominal pain. Elevated liver function tests. EXAM: NUCLEAR MEDICINE HEPATOBILIARY IMAGING TECHNIQUE: Sequential images of the abdomen were obtained out to 60 minutes following intravenous administration of radiopharmaceutical. RADIOPHARMACEUTICALS:  7.3 mCi Tc-58m Choletec IV COMPARISON:  05/03/2015 CT abdomen/ pelvis. FINDINGS: There is prompt radiotracer uptake by the liver with normal blood pool clearance. There is prompt radiotracer excretion into the common bile duct and small bowel beginning at 5 minutes. There is filling of the gallbladder  with radiotracer beginning at 30 minutes, indicating cystic duct patency. There is no evidence of a bile leak. IMPRESSION: 1. Normal filling of the gallbladder with radiotracer, indicating cystic duct patency, which is not consistent with acute cholecystitis. 2. Prompt radiotracer excretion into the common bile duct and small bowel, with no evidence of biliary obstruction. 3. Normal liver radiotracer uptake and excretion. Electronically Signed   By: JIlona SorrelM.D.   On: 05/07/2015 09:00   Mr Neck Soft Tissue Only W Wo Contrast  05/04/2015  CLINICAL DATA:  Initial evaluation for throat and facial swelling, productive cough, recently worsened. Patient also with leukocytosis and concern for cellulitis of neck. Evaluate submandibular gland which was question to be asymmetrically enlarged on prior CT. EXAM: MR NECK SOFT TISSUE ONLY WITHOUT AND WITH CONTRAST TECHNIQUE: Multiplanar, multisequence MR imaging was performed both before and after administration of intravenous contrast. CONTRAST:  772mMULTIHANCE GADOBENATE DIMEGLUMINE 529 MG/ML IV SOLN COMPARISON:  Prior neck CT from 05/03/2015. FINDINGS: Study is degraded by motion artifact. Visualized portions of the brain demonstrate no acute abnormality. Mucosal thickening within the visualized paranasal sinuses. No air-fluid levels to suggest active sinus infection. Mastoid air cells are grossly clear. The salivary glands including the parotid glands and submandibular glands are relatively symmetric and normal in appearance bilaterally. On today's study, the submandibular glands are fairly symmetric in appearance bilaterally, although the right may be slightly larger than the left, felt to be within normal limits for normal size variation. No asymmetric enhancement or focal mass lesion identified within the right submandibular gland to suggest underlying neoplasm. The submandibular glands enhance in a symmetric fashion bilaterally. Scattered edema involving  primarily the submandibular spaces bilaterally, similar to findings seen on prior CT. Given the provided history, finding is concerning for possible cellulitis. No drainable fluid collection or abscess. This is better appreciated on prior CT. No significant enhancement. Oral cavity demonstrates a normal appearance. Palatine tonsils normal. Parapharyngeal fat well-preserved. Nasopharynx and oropharynx within normal limits. Epiglottis is normal. Vallecula is clear. Hypopharynx and supraglottic larynx demonstrate no acute abnormality. Thyroid gland grossly unremarkable. No pathologically enlarged lymph nodes identified within the neck. There are 3 mildly prominent level 1A submental nodes measuring up to 7 mm, likely reactive. Mildly prominent left level 2 lymph nodes measure at the upper limits of normal at approximately 1 cm in short axis. Partially visualized superior mediastinum demonstrates no acute abnormality. Partially visualized lungs are grossly clear. Mild multilevel degenerative changes noted within the visualized cervical spine. Marrow signal intensity within normal limits. Signal intensity within the visualized cord is normal. IMPRESSION: 1. Motion degraded study with no discrete salivary gland  mass or other abnormality identified. 2. Grossly similar subcutaneous edema involving primarily the bilateral submandibular spaces. Again, while this finding is nonspecific, possible infection could have this appearance given the history of leukocytosis and concern for cellulitis. This is better evaluated on prior CT. No discrete abscess. Electronically Signed   By: Jeannine Boga M.D.   On: 05/04/2015 22:42   Dg Chest Port 1 View  05/08/2015  CLINICAL DATA:  Several day history of productive cough, shortness of breath and dyspnea. EXAM: PORTABLE CHEST 1 VIEW COMPARISON:  05/03/2015 FINDINGS: The heart is upper limits of normal in size and stable given the AP projection. The mediastinal and hilar contours  are normal. The lungs are clear. No pleural effusion. The bony thorax is intact. IMPRESSION: No acute cardiopulmonary findings. Electronically Signed   By: Marijo Sanes M.D.   On: 05/08/2015 10:00   Ct Renal Stone Study  05/03/2015  CLINICAL DATA:  Mid abdominal pain. Elevated liver function tests. Renal failure. EXAM: CT ABDOMEN AND PELVIS WITHOUT CONTRAST TECHNIQUE: Multidetector CT imaging of the abdomen and pelvis was performed following the standard protocol without IV contrast. COMPARISON:  04/23/2015 FINDINGS: Lower chest:  No acute findings. Hepatobiliary: No mass visualized on this un-enhanced exam. Gallbladder is unremarkable. No evidence of biliary ductal dilatation on this unenhanced exam. Pancreas: No mass or inflammatory process identified on this un-enhanced exam. Spleen: Within normal limits in size. Adrenals/Urinary Tract: No evidence of urolithiasis or hydronephrosis. Mild bilateral perinephric stranding is nonspecific and stable in appearance since previous study. Stomach/Bowel: No evidence of obstruction, inflammatory process, or abnormal fluid collections. Normal appendix visualized. Vascular/Lymphatic: No pathologically enlarged lymph nodes. No evidence of abdominal aortic aneurysm. Reproductive: Stable moderately enlarged prostate Other: None. Musculoskeletal:  No suspicious bone lesions identified. IMPRESSION: No evidence of urolithiasis, hydronephrosis, or other acute findings. Stable moderately enlarged prostate. Electronically Signed   By: Earle Gell M.D.   On: 05/03/2015 13:13   US Abdomen Limited Ruq  04/30/2015  CLINICAL DATA:  Elevated liver function tests. EXAM: US ABDOMEN LIMITED - RIGHT UPPER QUADRANT COMPARISON:  04/23/2015 FINDINGS: Gallbladder: Mildly thickened gallbladder wall at 3 mm but probably due to nondistention given the small caliber of the gallbladder. Sonographic Murphy sign indeterminate due to pain medication. No discrete gallstone identified. Common bile  duct: Diameter: 3 mm Liver: No focal lesion identified. Within normal limits in parenchymal echogenicity. IMPRESSION: 1. The mild gallbladder wall thickening is probably attributable 2 nondistention/contraction. No gallstones are observed. Sonographic Murphy's sign is technically indeterminate due to pain medication. Electronically Signed   By: Van Clines M.D.   On: 04/30/2015 18:57    Labs:  CBC:  Recent Labs  05/06/15 0519 05/08/15 0520 05/09/15 0559 05/10/15 0540  WBC 19.9* 23.8* 26.8* 23.5*  HGB 12.2* 10.8* 10.9* 10.6*  HCT 35.6* 31.3* 31.2* 30.7*  PLT 199 213 229 221    COAGS:  Recent Labs  08/13/14 0702 03/15/15 1319 05/03/15 1542 05/07/15 1205  INR 1.16 1.07  --  1.41  APTT 26  --  32  --     BMP:  Recent Labs  05/07/15 0457 05/08/15 0520 05/09/15 0559 05/10/15 0540  NA 134* 133* 134* 136  K 4.2 4.5 4.9 4.8  CL 108 106 105 106  CO2 19* 20* 20* 21*  GLUCOSE 83 116* 70 84  BUN 32* 33* 34* 38*  CALCIUM 8.1* 8.0* 8.2* 8.5*  CREATININE 2.19* 2.35* 2.47* 2.77*  GFRNONAA 31* 28* 27* 23*  GFRAA 36* 33* 31*  27*    LIVER FUNCTION TESTS:  Recent Labs  05/06/15 0519 05/07/15 0457 05/08/15 0520 05/09/15 0559  BILITOT 3.0* 3.2* 3.3* 3.9*  AST 547* 561* 517* 599*  ALT 419* 461* 460* 510*  ALKPHOS 155* 197* 237* 241*  PROT 4.7* 4.5* 4.9* 5.3*  ALBUMIN 2.5* 2.4* 2.5* 2.6*    TUMOR MARKERS: No results for input(s): AFPTM, CEA, CA199, CHROMGRNA in the last 8760 hours.  Assessment and Plan: [name] is a 59 y.o. male with past medical history significant for hypertension, CHF, cardiac artery disease with prior non-STEMI, polysubstance abuse, ischemic dilated cardiomyopathy, chronic kidney disease, recently admitted with history of rash, abdominal pain, neck swelling  and general malaise. Follow-up imaging revealed some nonspecific subcutaneous edema involving bilateral submandibular spaces, negative chest x-ray, negative nuclear medicine  hepatobiliary imaging, and CT with diffuse body wall edema no acute intra-abdominal process. Blood and urine cultures are negative. Laboratory studies revealed WBC of 23.5, hemoglobin 10.6, platelets 221k , creatinine  2.77 and elevated LFTs. Due to leukocytosis and eosinophilia of unknown etiology request has now been received for CT-guided bone marrow biopsy to include AFB smear and culture as well as fungal smear and cultures.Risks and benefits discussed with the patient including, but not limited to bleeding, infection, damage to adjacent structures or low yield requiring additional tests.All of the patient's questions were answered, patient is agreeable to proceed.Consent signed and in chart. Procedure tentatively scheduled for 10/25 AM.     Thank you for this interesting consult.  I greatly enjoyed meeting [name] and look forward to participating in their care.  A copy of this report was sent to the requesting provider on this date.  Signed: D. Rowe Robert 05/11/2015, 3:35 PM   I spent a total of 20 minutes in face to face in clinical consultation, greater than 50% of which was counseling/coordinating care for CT guided bone marrow biopsy

## 2015-05-11 NOTE — Progress Notes (Signed)
Progress Note   [name] BIP:[id number] DOB: Apr 13, 1956 DOA: 05/03/2015 PCP: Charolette Forward, MD   Brief Narrative:   [name] is an 59 y.o. male with a PMH of hypertension, chronic combined CHF, CAD, NSTEMI (on aspirin and plavix), CKD stage 3 who was admitted 05/03/15 with chief complaint of gradually worsening neck swelling. In ED, pt was hemodynamically stable. BP was 91/61, HR 92. Blood work revealed WBC count of 16.8, creatinine 2.26 (baseline 8 months ago 1.9). CT renal stone study showed no hydronephrosis or urolithiasis. LFT's were as follows: AST 299, ALT 283, TB 3.9. His recent abd [place] 04/30/15 showed mild gallbladder wall thickening probably attributable to nondistention/contraction. No gallstones. Other imaging studies included, CXR which showed no acute findings. X ray of the neck showed no acute findings. CT neck showed asymmetric enlargement of the right submandibular gland relative to the left, nonspecific but could be due to infection, inflammation or possibly tumor. ID was consulted due to diagnostic uncertainty, and an autoimmune workup has been initiated. The patient will also undergo bone marrow biopsy.  Assessment/Plan:   Principal Problem:   Sepsis due to cellulitis [facility]) with sepsis associated hypotension, neck swelling and leukocytosis - Sepsis criteria met 05/04/15 with hypotension, fever, tachycardia, leukocytosis and lactic acidosis. - Source of infection felt to be from neck cellulitis. Blood cultures negative to date. - Pt was given clindamycin on admission but this was changed to broad spectrum abx, vanco and zosyn for sepsis. - CT neck on admission demonstrated asymmetric enlargement of the right submandibular gland relative to the left. - MRI of the neck 05/04/15 consistent with cellulitis. No evidence of salivary gland mass or other abnormalities. - Blood cultures negative to date. Pro calcitonin elevation consistent with infection.  -  Antibiotics discontinued 05/08/15 after ID evaluation. HIV, CMV, EBV all negative.  Active Problems:   Abnormal urinalysis - Patient is complaining of blood in his urine, and foul-smelling urine. U/A done 05/10/15: Positive for nitrites with moderate leukocytes. - Urine culture sent.    R/O autoimmune disease - F/U ANA, SPEP, anti-DS ab, RF, anti CCP, ACE, SSA/SSAB, ANCA's,  Angiotensin Converting enzyme level. - ESR WNL at 10, CRP elevated at 9.7, LDH elevated at 582. Urinalysis with 100 mg/dL of protein. - Bone marrow biopsy ordered. Will need pathology for AFB, fungal and bacterial cultures.   Acute renal failure superimposed on stage 3 chronic kidney disease (HCC) - Baseline creatinine about 8 months ago 1.9. Currently 2.7. - Continue to hold Lasix and lisinopril. - CT renal stone protocol showed no hydronephrosis or urolithiasis.    Chronic combined systolic (congestive) and diastolic (congestive) heart failure (HCC) - Last 2 D ECHO in 12/2014 with EF of 20%. - Repeat chest x-ray 05/08/15: Clear.  Sees Harwani.   Dysphagia - Evaluated by speech therapy 05/04/15: Dysphagia 3 diet recommended.  Oral care TID.  MMW ordered. - Chest x-ray clear on admission and again on 05/08/15.    Benign essential HTN - All blood pressure medicines currently on hold.   History of non-ST elevation myocardial infarction (NSTEMI) - Stable, no reports of chest pain.  - Sinus rhythm on 12 lead EKG. - Continue aspirin and plavix. Statin on hold secondary to elevated LFTs.   Hyponatremia - Likely due to CHF etiology. Sodium stable at 132.   Abnormal LFTs - Recent US done 10/13 showed no acute findings, no gallstones seen. - LFTs worse today. HIDA scan & acute hepatitis serologies negative.  - Holding  Lipitor. - PT/INR reassuring, platelet count OK. - Evaluated by GI 05/08/15, no further GI evaluation recommended.    Polysubstance abuse / Noncompliance / Homelessness - SW consulted.  Counseled regarding abstinence. - UDS and alcohol level WNL    DVT Prophylaxis  - SCD's bilaterally, aspirin, plavix.   Code Status: Full.  Family Communication: No family at the bedside. Disposition Plan: Home once source of persistent elevation of WBCs and LFTs elucidated, SW to assist with disposition given homelessness.  Will likely need several more days in the hospital, with ongoing work up being pursued, ID consultation assisting in further work up.  IV Access:    Peripheral IV   Procedures and diagnostic studies:   Ct Abdomen Pelvis Wo Contrast  05/08/2015  CLINICAL DATA:  Abdominal pain, elevated liver function studies. Sepsis and leukocytosis. EXAM: CT ABDOMEN AND PELVIS WITHOUT CONTRAST TECHNIQUE: Multidetector CT imaging of the abdomen and pelvis was performed following the standard protocol without IV contrast. COMPARISON:  CT scan 05/03/2015 and 04/23/2015 FINDINGS: Lower chest: The lung bases are clear. The heart is mildly enlarged. No pericardial effusion. The distal esophagus is grossly normal. Hepatobiliary: No focal hepatic lesions or intrahepatic biliary dilatation. The gallbladder is grossly normal. No common bile duct dilatation. Pancreas: No mass, inflammation or ductal dilatation. Spleen: Normal size.  No focal lesions. Adrenals/Urinary Tract: Stable nodularity of the left adrenal gland, likely small adenomas. The right adrenal gland is normal. Both kidneys are normal. No renal or obstructing ureteral calculi or bladder calculi. Stomach/Bowel: The stomach, duodenum, small bowel and colon are grossly normal. No inflammatory changes, mass lesions or obstructive findings. The terminal ileum is normal. The appendix is normal. Vascular/Lymphatic: No mesenteric or retroperitoneal mass or adenopathy. Scattered aortic calcifications but no aneurysm. Other: The prostate gland is mildly enlarged. This is stable. The bladder is normal. The the seminal vesicles are normal. No  pelvic mass or adenopathy. No free pelvic fluid collections. No inguinal mass or adenopathy. Stable scattered borderline inguinal lymph nodes. Diffuse subcutaneous soft tissue swelling/ edema/fluid could suggest anasarca. No focal abscess. Musculoskeletal: No significant bony findings. IMPRESSION: 1. Diffuse body wall edema suggesting anasarca. 2. Mesenteric edema but no overt ascites. 3. No acute intra-abdominal/abnormalities. 4. Stable prostate gland enlargement and small hiatal hernia. Electronically Signed   By: Marijo Sanes M.D.   On: 05/08/2015 13:59   Nm Hepatobiliary Including Gb  05/07/2015  CLINICAL DATA:  59 year old male in patient with 2 weeks of abdominal pain. Elevated liver function tests. EXAM: NUCLEAR MEDICINE HEPATOBILIARY IMAGING TECHNIQUE: Sequential images of the abdomen were obtained out to 60 minutes following intravenous administration of radiopharmaceutical. RADIOPHARMACEUTICALS:  7.3 mCi Tc-66m Choletec IV COMPARISON:  05/03/2015 CT abdomen/ pelvis. FINDINGS: There is prompt radiotracer uptake by the liver with normal blood pool clearance. There is prompt radiotracer excretion into the common bile duct and small bowel beginning at 5 minutes. There is filling of the gallbladder with radiotracer beginning at 30 minutes, indicating cystic duct patency. There is no evidence of a bile leak. IMPRESSION: 1. Normal filling of the gallbladder with radiotracer, indicating cystic duct patency, which is not consistent with acute cholecystitis. 2. Prompt radiotracer excretion into the common bile duct and small bowel, with no evidence of biliary obstruction. 3. Normal liver radiotracer uptake and excretion. Electronically Signed   By: JIlona SorrelM.D.   On: 05/07/2015 09:00   Dg Chest Port 1 View  05/08/2015  CLINICAL DATA:  Several day history of productive cough, shortness of  breath and dyspnea. EXAM: PORTABLE CHEST 1 VIEW COMPARISON:  05/03/2015 FINDINGS: The heart is upper limits of  normal in size and stable given the AP projection. The mediastinal and hilar contours are normal. The lungs are clear. No pleural effusion. The bony thorax is intact. IMPRESSION: No acute cardiopulmonary findings. Electronically Signed   By: Marijo Sanes M.D.   On: 05/08/2015 10:00     Medical Consultants:    GI: Irene Shipper, MD  ID: Scharlene Gloss, MD  Anti-Infectives:   Anti-infectives    Start     Dose/Rate Route Frequency Ordered Stop   05/07/15 0700  vancomycin (VANCOCIN) IVPB 1000 mg/200 mL premix  Status:  Discontinued     1,000 mg 200 mL/hr over 60 Minutes Intravenous Every 24 hours 05/07/15 0611 05/08/15 1438   05/04/15 1100  piperacillin-tazobactam (ZOSYN) IVPB 3.375 g  Status:  Discontinued     3.375 g 12.5 mL/hr over 240 Minutes Intravenous 3 times per day 05/04/15 1003 05/08/15 1438   05/04/15 0600  vancomycin (VANCOCIN) IVPB 750 mg/150 ml premix  Status:  Discontinued     750 mg 150 mL/hr over 60 Minutes Intravenous Every 24 hours 05/04/15 0557 05/07/15 0610   05/03/15 2200  clindamycin (CLEOCIN) IVPB 600 mg  Status:  Discontinued     600 mg 100 mL/hr over 30 Minutes Intravenous 3 times per day 05/03/15 1524 05/04/15 0225   05/03/15 1330  clindamycin (CLEOCIN) IVPB 600 mg     600 mg 100 mL/hr over 30 Minutes Intravenous  Once 05/03/15 1326 05/03/15 1457      Subjective:   [name] continues to report mild shortness of breath, swelling/edema of left hand, and dark, foul-smelling urine.  Objective:    Filed Vitals:   05/10/15 0511 05/10/15 1502 05/10/15 2143 05/11/15 0546  BP: 1_0 119/63  Pulse: 74 103 94 113  Temp: 97.6 F (36.4 C) 100.3 F (37.9 C) 98.7 F (37.1 C) 98 F (36.7 C)  TempSrc: Oral Oral Oral Oral  Resp: _1 Height:      Weight: 78 kg (171 lb 15.3 oz)   80.5 kg (177 lb 7.5 oz)  SpO2: 100% 98% 100% 100%    Intake/Output Summary (Last 24 hours) at 05/11/15 0816 Last data filed at 05/10/15 2200  Gross per  24 hour  Intake    780 ml  Output    200 ml  Net    580 ml   Filed Weights   05/09/15 0512 05/10/15 0511 05/11/15 0546  Weight: 76.3 kg (168 lb 3.4 oz) 78 kg (171 lb 15.3 oz) 80.5 kg (177 lb 7.5 oz)    Exam: Gen:  NAD Cardiovascular:  RRR, No M/R/G Respiratory:  Lungs CTAB Gastrointestinal:  Abdomen soft, NT/ND, + BS GU: Swelling around foreskin decreased, no sores/erythema Extremities:  Left hand swollen, +swelling BLE, 1+ Skin: No erythema, dermis dry with desquamation noted.   Data Reviewed:    Labs: Basic Metabolic Panel:  Recent Labs Lab 05/06/15 0519 05/07/15 0457 05/08/15 0520 05/09/15 0559 05/10/15 0540  NA 132* 134* 133* 134* 136  K 4.3 4.2 4.5 4.9 4.8  CL 105 108 106 105 106  CO2 17* 19* 20* 20* 21*  GLUCOSE 69 83 116* 70 84  BUN 33* 32* 33* 34* 38*  CREATININE 2.10* 2.19* 2.35* 2.47* 2.77*  CALCIUM 8.0* 8.1* 8.0* 8.2* 8.5*   GFR Estimated Creatinine Clearance: 27.1 mL/min (by C-G formula based on Cr of 2.77).  Liver Function Tests:  Recent Labs Lab 05/06/15 0519 05/07/15 0457 05/08/15 0520 05/09/15 0559  AST 547* 561* 517* 599*  ALT 419* 461* 460* 510*  ALKPHOS 155* 197* 237* 241*  BILITOT 3.0* 3.2* 3.3* 3.9*  PROT 4.7* 4.5* 4.9* 5.3*  ALBUMIN 2.5* 2.4* 2.5* 2.6*   No results for input(s): LIPASE, AMYLASE in the last 168 hours. CBC:  Recent Labs Lab 05/05/15 0525 05/06/15 0519 05/08/15 0520 05/09/15 0559 05/10/15 0540  WBC 15.4* 19.9* 23.8* 26.8* 23.5*  HGB 14.1 12.2* 10.8* 10.9* 10.6*  HCT 42.1 35.6* 31.3* 31.2* 30.7*  MCV 99.5 98.6 101.0* 103.7* 103.7*  PLT 209 199 213 229 221   BNP (last 3 results)  Recent Labs  04/13/15 1012  PROBNP 3040.00*   CBG:  Recent Labs Lab 05/07/15 1723 05/08/15 0741 05/09/15 0855 05/10/15 0729 05/10/15 0828  GLUCAP 123* 68 57* 47* 92   Sepsis Labs:  Recent Labs Lab 05/04/15 1130 05/04/15 1400  05/06/15 0519 05/07/15 1439 05/08/15 0520 05/09/15 0539 05/09/15 0559  05/10/15 0540  PROCALCITON 3.23  --   --   --  1.86 1.56 1.85  --   --   WBC  --   --   < > 19.9*  --  23.8*  --  26.8* 23.5*  LATICACIDVEN 3.6* 3.6*  --   --  2.6*  --   --   --   --   < > = values in this interval not displayed. Microbiology Recent Results (from the past 240 hour(s))  Culture, blood (routine x 2)     Status: None   Collection Time: 05/04/15  2:50 AM  Result Value Ref Range Status   Specimen Description BLOOD RIGHT ARM  Final   Special Requests BOTTLES DRAWN AEROBIC AND ANAEROBIC [place]  Final   Culture   Final    NO GROWTH 5 DAYS Performed at [facility]    Report Status 05/09/2015 FINAL  Final  Culture, blood (routine x 2)     Status: None   Collection Time: 05/04/15  2:55 AM  Result Value Ref Range Status   Specimen Description BLOOD RIGHT HAND  Final   Special Requests BOTTLES DRAWN AEROBIC ONLY 10CC  Final   Culture   Final    NO GROWTH 5 DAYS Performed at [facility]    Report Status 05/09/2015 FINAL  Final  Culture, Urine     Status: None   Collection Time: 05/04/15 10:57 PM  Result Value Ref Range Status   Specimen Description URINE, RANDOM  Final   Special Requests NONE  Final   Culture   Final    NO GROWTH 1 DAY Performed at [facility]    Report Status 05/06/2015 FINAL  Final     Medications:   . aspirin EC  81 mg Oral Daily  . clopidogrel  75 mg Oral Daily  . diphenhydrAMINE  25 mg Oral Q6H  . famotidine  20 mg Oral Daily  . magic mouthwash  5 mL Oral QID  . mirabegron ER  25 mg Oral Daily  . sodium chloride  250 mL Intravenous Once  . sodium chloride  3 mL Intravenous Q12H   Continuous Infusions:    Time spent: 25 minutes.    LOS: 7 days   [facility] Pager [phone]. If unable to reach me by pager, please call my cell phone at [phone].  *Please refer to [email or web address], password [email or web address] to get updated schedule on who  will round on this patient, as hospitalists switch teams weekly. If  7PM-7AM, please contact night-coverage at [email or web address], password [email or web address] for any overnight needs.  05/11/2015, 8:16 AM

## 2015-05-12 ENCOUNTER — Telehealth: Payer: Self-pay

## 2015-05-12 ENCOUNTER — Inpatient Hospital Stay (HOSPITAL_COMMUNITY): Payer: Medicaid Other

## 2015-05-12 ENCOUNTER — Encounter (HOSPITAL_COMMUNITY): Payer: Self-pay | Admitting: Radiology

## 2015-05-12 LAB — CBC WITH DIFFERENTIAL/PLATELET
Basophils Absolute: 0.2 10*3/uL — ABNORMAL HIGH (ref 0.0–0.1)
Basophils Relative: 1 %
EOS ABS: 4.1 10*3/uL — AB (ref 0.0–0.7)
Eosinophils Relative: 26 %
HCT: 27.1 % — ABNORMAL LOW (ref 39.0–52.0)
Hemoglobin: 9.9 g/dL — ABNORMAL LOW (ref 13.0–17.0)
Lymphocytes Relative: 25 %
Lymphs Abs: 4 10*3/uL (ref 0.7–4.0)
MCH: 38.8 pg — AB (ref 26.0–34.0)
MCHC: 36.5 g/dL — AB (ref 30.0–36.0)
MCV: 106.3 fL — ABNORMAL HIGH (ref 78.0–100.0)
MONOS PCT: 11 %
Monocytes Absolute: 1.7 10*3/uL — ABNORMAL HIGH (ref 0.1–1.0)
NEUTROS ABS: 5.9 10*3/uL (ref 1.7–7.7)
NEUTROS PCT: 37 %
PLATELETS: 217 10*3/uL (ref 150–400)
RBC: 2.55 MIL/uL — AB (ref 4.22–5.81)
RDW: 17.2 % — ABNORMAL HIGH (ref 11.5–15.5)
WBC: 15.9 10*3/uL — AB (ref 4.0–10.5)
nRBC: 2 /100 WBC — ABNORMAL HIGH

## 2015-05-12 LAB — PROTEIN ELECTROPHORESIS, SERUM
A/G RATIO SPE: 1 (ref 0.7–1.7)
ALBUMIN ELP: 2.9 g/dL (ref 2.9–4.4)
Alpha-1-Globulin: 0.4 g/dL (ref 0.0–0.4)
Alpha-2-Globulin: 0.5 g/dL (ref 0.4–1.0)
Beta Globulin: 0.6 g/dL — ABNORMAL LOW (ref 0.7–1.3)
Gamma Globulin: 1.4 g/dL (ref 0.4–1.8)
Globulin, Total: 2.9 g/dL (ref 2.2–3.9)
Total Protein ELP: 5.8 g/dL — ABNORMAL LOW (ref 6.0–8.5)

## 2015-05-12 LAB — COMPREHENSIVE METABOLIC PANEL
ALT: 518 U/L — ABNORMAL HIGH (ref 17–63)
ANION GAP: 8 (ref 5–15)
AST: 702 U/L — AB (ref 15–41)
Albumin: 2.5 g/dL — ABNORMAL LOW (ref 3.5–5.0)
Alkaline Phosphatase: 252 U/L — ABNORMAL HIGH (ref 38–126)
BILIRUBIN TOTAL: 5.2 mg/dL — AB (ref 0.3–1.2)
BUN: 48 mg/dL — AB (ref 6–20)
CO2: 18 mmol/L — ABNORMAL LOW (ref 22–32)
Calcium: 8.3 mg/dL — ABNORMAL LOW (ref 8.9–10.3)
Chloride: 111 mmol/L (ref 101–111)
Creatinine, Ser: 3.04 mg/dL — ABNORMAL HIGH (ref 0.61–1.24)
GFR, EST AFRICAN AMERICAN: 24 mL/min — AB (ref >60)
GFR, EST NON AFRICAN AMERICAN: 21 mL/min — AB (ref >60)
Glucose, Bld: 89 mg/dL (ref 65–99)
Potassium: 5.7 mmol/L — ABNORMAL HIGH (ref 3.5–5.1)
Sodium: 137 mmol/L (ref 135–145)
TOTAL PROTEIN: 5.9 g/dL — AB (ref 6.5–8.1)

## 2015-05-12 LAB — BASIC METABOLIC PANEL
Anion gap: 7 (ref 5–15)
BUN: 49 mg/dL — ABNORMAL HIGH (ref 6–20)
CALCIUM: 8.5 mg/dL — AB (ref 8.9–10.3)
CHLORIDE: 112 mmol/L — AB (ref 101–111)
CO2: 19 mmol/L — AB (ref 22–32)
CREATININE: 3.02 mg/dL — AB (ref 0.61–1.24)
GFR calc non Af Amer: 21 mL/min — ABNORMAL LOW (ref >60)
GFR, EST AFRICAN AMERICAN: 24 mL/min — AB (ref >60)
Glucose, Bld: 76 mg/dL (ref 65–99)
POTASSIUM: 5.7 mmol/L — AB (ref 3.5–5.1)
Sodium: 138 mmol/L (ref 135–145)

## 2015-05-12 LAB — HIV-1 RNA ULTRAQUANT REFLEX TO GENTYP+: HIV-1 RNA Quant, Log: UNDETERMINED log10copy/mL

## 2015-05-12 LAB — GLUCOSE, CAPILLARY: GLUCOSE-CAPILLARY: 77 mg/dL (ref 65–99)

## 2015-05-12 LAB — ANCA TITERS
Atypical P-ANCA titer: <1:20 {titer}
P-ANCA: <1:20 {titer}

## 2015-05-12 LAB — BONE MARROW EXAM

## 2015-05-12 LAB — ANGIOTENSIN CONVERTING ENZYME: ANGIOTENSIN-CONVERTING ENZYME: 91 U/L — AB (ref 14–82)

## 2015-05-12 LAB — ANTINUCLEAR ANTIBODIES, IFA: ANTINUCLEAR ANTIBODIES, IFA: NEGATIVE

## 2015-05-12 MED ORDER — MIDAZOLAM HCL 2 MG/2ML IJ SOLN
INTRAMUSCULAR | Status: AC
  Filled 2015-05-12: qty 4

## 2015-05-12 MED ORDER — FENTANYL CITRATE (PF) 100 MCG/2ML IJ SOLN
INTRAMUSCULAR | Status: AC
  Filled 2015-05-12: qty 4

## 2015-05-12 MED ORDER — SODIUM POLYSTYRENE SULFONATE 15 GM/60ML PO SUSP
15.0000 g | Freq: Once | ORAL | Status: AC
  Administered 2015-05-12: 15 g via ORAL
  Filled 2015-05-12: qty 60

## 2015-05-12 MED ORDER — FENTANYL CITRATE (PF) 100 MCG/2ML IJ SOLN
INTRAMUSCULAR | Status: AC | PRN
  Administered 2015-05-12 (×2): 25 ug via INTRAVENOUS

## 2015-05-12 MED ORDER — MIDAZOLAM HCL 2 MG/2ML IJ SOLN
INTRAMUSCULAR | Status: AC | PRN
  Administered 2015-05-12: 1 mg via INTRAVENOUS
  Administered 2015-05-12: 0.5 mg via INTRAVENOUS

## 2015-05-12 NOTE — Progress Notes (Signed)
Patient ID: [name], male   DOB: [birth year], 59 y.o.   MRN: [id number]  [facility] PROGRESS NOTE  [name] ZSW:[id number] DOB: 01-11-1956 DOA: 05/03/2015 PCP: Charolette Forward, MD   Brief narrative:    59 y.o. male with hypertension, chronic combined CHF, CAD, NSTEMI (on aspirin and plavix), CKD stage 3 who was admitted 05/03/15 with chief complaint of gradually worsening neck swelling. In ED, pt was hemodynamically stable. BP was 91/61, HR 92. Blood work revealed WBC count of 16.8, creatinine 2.26 (baseline 8 months ago 1.9). CT renal stone study showed no hydronephrosis or urolithiasis. LFT's were as follows: AST 299, ALT 283, TB 3.9. His recent abd [place] 04/30/15 showed mild gallbladder wall thickening probably attributable to nondistention/contraction. No gallstones. Other imaging studies included, CXR which showed no acute findings. X ray of the neck showed no acute findings. CT neck showed asymmetric enlargement of the right submandibular gland relative to the left, nonspecific but could be due to infection, inflammation or possibly tumor. ID was consulted due to diagnostic uncertainty, and an autoimmune workup has been initiated. The patient will also undergo bone marrow biopsy.  Assessment/Plan:    Principal Problem:  Sepsis due to cellulitis [facility]) with sepsis associated hypotension, neck swelling and leukocytosis - Sepsis criteria met 05/04/15 with hypotension, fever, tachycardia, leukocytosis and lactic acidosis. - Source of infection felt to be from neck cellulitis. Blood cultures negative to date. - Pt was given clindamycin on admission but this was changed to broad spectrum abx, vanco and zosyn for sepsis. - CT neck on admission demonstrated asymmetric enlargement of the right submandibular gland relative to the left. - MRI of the neck 05/04/15 consistent with cellulitis. No evidence of salivary gland mass or other abnormalities. - Blood cultures negative to date.  Pro calcitonin elevation consistent with infection.  - Antibiotics discontinued 05/08/15 after ID evaluation. HIV, CMV, EBV all negative.  Active Problems:  Abnormal urinalysis - urine culture with no growth in 24 hours  - WBC is trending down  - has not been on ABX per ID   R/O autoimmune disease - F/U ANA, SPEP, anti-DS ab, RF, anti CCP, ACE, SSA/SSAB, ANCA's, Angiotensin Converting enzyme level. - ESR WNL at 10, CRP elevated at 9.7, LDH elevated at 582. Urinalysis with 100 mg/dL of protein. - Bone marrow biopsy done 10/25, sent for pathology for AFB, fungal and bacterial cultures.   Acute renal failure superimposed on stage 3 chronic kidney disease ([place]) - with hyperkalemia  - Baseline creatinine about 8 months ago 1.9. Currently 3 - Continue to hold Lasix and lisinopril. - CT renal stone protocol showed no hydronephrosis or urolithiasis.  - give dose of kayexalate  - BMP in AM   Chronic combined systolic (congestive) and diastolic (congestive) heart failure (HCC) - Last 2 D ECHO in 12/2014 with EF of 20%. - Repeat chest x-ray 05/08/15: Clear. Sees Harwani.   Dysphagia - Evaluated by speech therapy 05/04/15: Dysphagia 3 diet recommended. Oral care TID. MMW ordered. - Chest x-ray clear on admission and again on 05/08/15.   Benign essential HTN - All blood pressure medicines currently on hold.   History of non-ST elevation myocardial infarction (NSTEMI) - Stable, no reports of chest pain.  - Sinus rhythm on 12 lead EKG. - Continue aspirin and plavix. Statin on hold secondary to elevated LFTs.   Hyponatremia - Likely due to CHF etiology. Sodium stable at 138   Abnormal LFTs - Recent US done 10/13 showed no acute findings, no gallstones  seen. - LFTs worse today. HIDA scan & acute hepatitis serologies negative.  - Holding Lipitor. - PT/INR reassuring, platelet count OK. - Evaluated by GI 05/08/15, no further GI evaluation recommended.   Polysubstance  abuse / Noncompliance / Homelessness - SW consulted. Counseled regarding abstinence. - UDS and alcohol level WNL   DVT Prophylaxis  - SCD's bilaterally, aspirin, plavix.   Code Status: Full.  Family Communication: No family at the bedside. Disposition Plan: Home in 24 - 48 hours  IV access:  Peripheral IV  Procedures and diagnostic studies:    Ct Abdomen Pelvis Wo Contrast 05/08/2015 Diffuse body wall edema suggesting anasarca. 2. Mesenteric edema but no overt ascites. 3. No acute intra-abdominal/abnormalities. 4. Stable prostate gland enlargement and small hiatal hernia.   Dg Neck Soft Tissue 05/03/2015 No acute findings.   Ct Soft Tissue Neck Wo Contrast 05/03/2015   Asymmetric enlargement of the right submandibular gland relative to the left is nonspecific but could be due to infection, inflammation or possibly tumor. MRI of the neck with contrast if possible after the patient's acute episode has passed is recommended for further evaluation. Negative for abscess. Diffuse subcutaneous edema is nonspecific and likely due to anasarca rather than cellulitis.   Nm Hepatobiliary Including Gb 05/07/2015  Normal filling of the gallbladder with radiotracer, indicating cystic duct patency, which is not consistent with acute cholecystitis. 2. Prompt radiotracer excretion into the common bile duct and small bowel, with no evidence of biliary obstruction. 3. Normal liver radiotracer uptake and excretion.   Mr Neck Soft Tissue Only W Wo Contrast 05/04/2015  Motion degraded study with no discrete salivary gland mass or other abnormality identified. 2. Grossly similar subcutaneous edema involving primarily the bilateral submandibular spaces. Again, while this finding is nonspecific, possible infection could have this appearance given the history of leukocytosis and concern for cellulitis. This is better evaluated on prior CT. No discrete abscess.  Ct Biopsy 05/12/2015  Status post CT-guided  bone marrow biopsy, with tissue specimen sent to pathology for complete histopathologic analysis  Dg Chest Port 1 View 05/08/2015 No acute cardiopulmonary findings.  Ct Renal Stone Study 05/03/2015   No evidence of urolithiasis, hydronephrosis, or other acute findings. Stable moderately enlarged prostate.   US Abdomen Limited Ruq 04/30/2015 The mild gallbladder wall thickening is probably attributable 2 nondistention/contraction. No gallstones are observed. Sonographic Murphy's sign is technically indeterminate due to pain medication.   Medical Consultants:  IR ID  Other Consultants:  None  IAnti-Infectives:     Faye Ramsay, MD  [facility] Pager [phone]  If 7PM-7AM, please contact night-coverage [email or web address] Password [email or web address] 05/12/2015, 5:00 PM   LOS: 8 days   HPI/Subjective: No events overnight.   Objective: Filed Vitals:   05/12/15 1025 05/12/15 1050 05/12/15 1120 05/12/15 1356  BP: 105/69 107/73 100/68 115/78  Pulse: 84 80 82 82  Temp: 97.6 F (36.4 C)  97.3 F (36.3 C) 98.3 F (36.8 C)  TempSrc: Oral  Oral Oral  Resp: '18 20 20 20  ' Height:      Weight:      SpO2: 100% 100% 100% 99%    Intake/Output Summary (Last 24 hours) at 05/12/15 1700 Last data filed at 05/12/15 1300  Gross per 24 hour  Intake    840 ml  Output    200 ml  Net    640 ml    Exam:   General:  Pt is alert, follows commands appropriately, not in acute distress  Cardiovascular: Regular rate and rhythm,  S1/S2, no murmurs, no rubs, no gallops  Respiratory: Clear to auscultation bilaterally, no wheezing, no crackles, no rhonchi  Abdomen: Soft, non tender, non distended, bowel sounds present, no guarding  Data Reviewed: Basic Metabolic Panel:  Recent Labs Lab 05/08/15 0520 05/09/15 0559 05/10/15 0540 05/12/15 0516 05/12/15 1000  NA 133* 134* 136 137 138  K 4.5 4.9 4.8 5.7* 5.7*  CL 106 105 106 111 112*  CO2 20* 20* 21* 18* 19*  GLUCOSE 116* 70 84 89 76  BUN 33* 34* 38*  48* 49*  CREATININE 2.35* 2.47* 2.77* 3.04* 3.02*  CALCIUM 8.0* 8.2* 8.5* 8.3* 8.5*   Liver Function Tests:  Recent Labs Lab 05/06/15 0519 05/07/15 0457 05/08/15 0520 05/09/15 0559 05/12/15 0516  AST 547* 561* 517* 599* 702*  ALT 419* 461* 460* 510* 518*  ALKPHOS 155* 197* 237* 241* 252*  BILITOT 3.0* 3.2* 3.3* 3.9* 5.2*  PROT 4.7* 4.5* 4.9* 5.3* 5.9*  ALBUMIN 2.5* 2.4* 2.5* 2.6* 2.5*   No results for input(s): LIPASE, AMYLASE in the last 168 hours. No results for input(s): AMMONIA in the last 168 hours. CBC:  Recent Labs Lab 05/06/15 0519 05/08/15 0520 05/09/15 0559 05/10/15 0540 05/12/15 0516  WBC 19.9* 23.8* 26.8* 23.5* 15.9*  NEUTROABS  --   --   --   --  5.9  HGB 12.2* 10.8* 10.9* 10.6* 9.9*  HCT 35.6* 31.3* 31.2* 30.7* 27.1*  MCV 98.6 101.0* 103.7* 103.7* 106.3*  PLT 199 213 229 221 217   CBG:  Recent Labs Lab 05/09/15 0855 05/10/15 0729 05/10/15 0828 05/11/15 0806 05/12/15 0602  GLUCAP 57* 47* 92 67 77    Recent Results (from the past 240 hour(s))  Culture, blood (routine x 2)     Status: None   Collection Time: 05/04/15  2:50 AM  Result Value Ref Range Status   Specimen Description BLOOD RIGHT ARM  Final   Special Requests BOTTLES DRAWN AEROBIC AND ANAEROBIC [place]  Final   Culture   Final    NO GROWTH 5 DAYS Performed at [facility]    Report Status 05/09/2015 FINAL  Final  Culture, blood (routine x 2)     Status: None   Collection Time: 05/04/15  2:55 AM  Result Value Ref Range Status   Specimen Description BLOOD RIGHT HAND  Final   Special Requests BOTTLES DRAWN AEROBIC ONLY 10CC  Final   Culture   Final    NO GROWTH 5 DAYS Performed at [facility]    Report Status 05/09/2015 FINAL  Final  Culture, Urine     Status: None   Collection Time: 05/04/15 10:57 PM  Result Value Ref Range Status   Specimen Description URINE, RANDOM  Final   Special Requests NONE  Final   Culture   Final    NO GROWTH 1 DAY Performed at [facility]    Report Status 05/06/2015 FINAL  Final  Culture, Urine     Status: None (Preliminary result)   Collection Time: 05/11/15  4:21 PM  Result Value Ref Range Status   Specimen Description URINE, CLEAN CATCH  Final   Special Requests Normal  Final   Culture   Final    NO GROWTH < 12 HOURS Performed at [facility]    Report Status PENDING  Incomplete     Scheduled Meds: . aspirin EC  81 mg Oral Daily  . clopidogrel  75 mg Oral Daily  . diphenhydrAMINE  25 mg Oral  Q6H  . famotidine  20 mg Oral Daily  . fentaNYL      . magic mouthwash  5 mL Oral QID  . midazolam      . mirabegron ER  25 mg Oral Daily  . sodium chloride  250 mL Intravenous Once  . sodium chloride  3 mL Intravenous Q12H   Continuous Infusions:

## 2015-05-12 NOTE — Telephone Encounter (Signed)
CMA called pt, pt wasn't available due to being admitted in the hospital. Pt brother Legrand Como verified pt name and DOB, so pt brother was given the results and he assured me that he would give them to him.

## 2015-05-12 NOTE — Telephone Encounter (Signed)
-----   Message from Nila Nephew, RN sent at 05/12/2015 12:39 PM EDT -----   ----- Message -----    From: [name], MD    Sent: 04/14/2015   8:44 AM      To: Esperanza Sheets, RN  Please advise him to remain on Lasix 60mg  bid as his labs reveal evidence of acute CHF

## 2015-05-12 NOTE — Procedures (Signed)
Interventional Radiology Procedure Note  Procedure: CT guided aspirate and core biopsy of right iliac bone Complications: None Recommendations: - Bedrest supine x 3 hrs   - Follow biopsy results, and culture   Signed,  Dulcy Fanny. Earleen Newport, DO

## 2015-05-13 ENCOUNTER — Inpatient Hospital Stay (HOSPITAL_COMMUNITY): Payer: Medicaid Other

## 2015-05-13 DIAGNOSIS — R509 Fever, unspecified: Secondary | ICD-10-CM

## 2015-05-13 DIAGNOSIS — R000 Tachycardia, unspecified: Secondary | ICD-10-CM

## 2015-05-13 DIAGNOSIS — A419 Sepsis, unspecified organism: Secondary | ICD-10-CM | POA: Insufficient documentation

## 2015-05-13 DIAGNOSIS — N183 Chronic kidney disease, stage 3 (moderate): Secondary | ICD-10-CM

## 2015-05-13 DIAGNOSIS — J80 Acute respiratory distress syndrome: Secondary | ICD-10-CM

## 2015-05-13 DIAGNOSIS — J9600 Acute respiratory failure, unspecified whether with hypoxia or hypercapnia: Secondary | ICD-10-CM

## 2015-05-13 DIAGNOSIS — D721 Eosinophilia: Secondary | ICD-10-CM

## 2015-05-13 DIAGNOSIS — N189 Chronic kidney disease, unspecified: Secondary | ICD-10-CM

## 2015-05-13 DIAGNOSIS — L0390 Cellulitis, unspecified: Secondary | ICD-10-CM

## 2015-05-13 DIAGNOSIS — N179 Acute kidney failure, unspecified: Secondary | ICD-10-CM

## 2015-05-13 DIAGNOSIS — R0600 Dyspnea, unspecified: Secondary | ICD-10-CM

## 2015-05-13 LAB — BLOOD GAS, ARTERIAL
ACID-BASE DEFICIT: 19.2 mmol/L — AB (ref 0.0–2.0)
BICARBONATE: 6 meq/L — AB (ref 20.0–24.0)
DRAWN BY: 308601
FIO2: 0.55
O2 Saturation: 98.9 %
PATIENT TEMPERATURE: 98.6
PCO2 ART: 12.4 mmHg — AB (ref 35.0–45.0)
PH ART: 7.303 — AB (ref 7.350–7.450)
TCO2: 5.6 mmol/L (ref 0–100)
pO2, Arterial: 179 mmHg — ABNORMAL HIGH (ref 80.0–100.0)

## 2015-05-13 LAB — COMPREHENSIVE METABOLIC PANEL
ALBUMIN: 3 g/dL — AB (ref 3.5–5.0)
ALT: 722 U/L — AB (ref 17–63)
AST: 1159 U/L — AB (ref 15–41)
Alkaline Phosphatase: 307 U/L — ABNORMAL HIGH (ref 38–126)
Anion gap: 18 — ABNORMAL HIGH (ref 5–15)
BUN: 55 mg/dL — AB (ref 6–20)
CHLORIDE: 107 mmol/L (ref 101–111)
CO2: 13 mmol/L — ABNORMAL LOW (ref 22–32)
CREATININE: 3.78 mg/dL — AB (ref 0.61–1.24)
Calcium: 8.4 mg/dL — ABNORMAL LOW (ref 8.9–10.3)
GFR calc Af Amer: 18 mL/min — ABNORMAL LOW (ref >60)
GFR, EST NON AFRICAN AMERICAN: 16 mL/min — AB (ref >60)
GLUCOSE: 41 mg/dL — AB (ref 65–99)
POTASSIUM: 6.1 mmol/L — AB (ref 3.5–5.1)
SODIUM: 138 mmol/L (ref 135–145)
Total Bilirubin: 9 mg/dL — ABNORMAL HIGH (ref 0.3–1.2)
Total Protein: 7.6 g/dL (ref 6.5–8.1)

## 2015-05-13 LAB — URINALYSIS, ROUTINE W REFLEX MICROSCOPIC
Glucose, UA: NEGATIVE mg/dL
Ketones, ur: NEGATIVE mg/dL
NITRITE: POSITIVE — AB
Protein, ur: 100 mg/dL — AB
SPECIFIC GRAVITY, URINE: 1.02 (ref 1.005–1.030)
UROBILINOGEN UA: 1 mg/dL (ref 0.0–1.0)
pH: 5 (ref 5.0–8.0)

## 2015-05-13 LAB — BASIC METABOLIC PANEL
Anion gap: 8 (ref 5–15)
Anion gap: 10 (ref 5–15)
BUN: 52 mg/dL — ABNORMAL HIGH (ref 6–20)
BUN: 53 mg/dL — AB (ref 6–20)
CALCIUM: 8.4 mg/dL — AB (ref 8.9–10.3)
CO2: 19 mmol/L — ABNORMAL LOW (ref 22–32)
CO2: 18 mmol/L — ABNORMAL LOW (ref 22–32)
CREATININE: 3.15 mg/dL — AB (ref 0.61–1.24)
CREATININE: 3.56 mg/dL — AB (ref 0.61–1.24)
Calcium: 8.5 mg/dL — ABNORMAL LOW (ref 8.9–10.3)
Chloride: 111 mmol/L (ref 101–111)
Chloride: 109 mmol/L (ref 101–111)
GFR calc Af Amer: 20 mL/min — ABNORMAL LOW (ref >60)
GFR calc non Af Amer: 20 mL/min — ABNORMAL LOW (ref >60)
GFR, EST AFRICAN AMERICAN: 23 mL/min — AB (ref >60)
GFR, EST NON AFRICAN AMERICAN: 17 mL/min — AB (ref >60)
Glucose, Bld: 99 mg/dL (ref 65–99)
Glucose, Bld: 115 mg/dL — ABNORMAL HIGH (ref 65–99)
Potassium: 5.6 mmol/L — ABNORMAL HIGH (ref 3.5–5.1)
Potassium: 5 mmol/L (ref 3.5–5.1)
SODIUM: 138 mmol/L (ref 135–145)
SODIUM: 137 mmol/L (ref 135–145)

## 2015-05-13 LAB — CBC WITH DIFFERENTIAL/PLATELET
BASOS PCT: 3 %
Basophils Absolute: 0.4 10*3/uL — ABNORMAL HIGH (ref 0.0–0.1)
EOS PCT: 12 %
Eosinophils Absolute: 1.6 10*3/uL — ABNORMAL HIGH (ref 0.0–0.7)
HEMATOCRIT: 31.3 % — AB (ref 39.0–52.0)
HEMOGLOBIN: 11.4 g/dL — AB (ref 13.0–17.0)
LYMPHS ABS: 4 10*3/uL (ref 0.7–4.0)
LYMPHS PCT: 31 %
MCH: 39.7 pg — AB (ref 26.0–34.0)
MCHC: 36.4 g/dL — AB (ref 30.0–36.0)
MCV: 109.1 fL — AB (ref 78.0–100.0)
MONOS PCT: 15 %
Monocytes Absolute: 2 10*3/uL — ABNORMAL HIGH (ref 0.1–1.0)
NEUTROS ABS: 5 10*3/uL (ref 1.7–7.7)
Neutrophils Relative %: 39 %
Platelets: 246 10*3/uL (ref 150–400)
RBC: 2.87 MIL/uL — ABNORMAL LOW (ref 4.22–5.81)
RDW: 19.4 % — ABNORMAL HIGH (ref 11.5–15.5)
WBC: 13 10*3/uL — ABNORMAL HIGH (ref 4.0–10.5)

## 2015-05-13 LAB — CBC
HCT: 27.7 % — ABNORMAL LOW (ref 39.0–52.0)
Hemoglobin: 9.8 g/dL — ABNORMAL LOW (ref 13.0–17.0)
MCH: 38.1 pg — ABNORMAL HIGH (ref 26.0–34.0)
MCHC: 35.4 g/dL (ref 30.0–36.0)
MCV: 107.8 fL — ABNORMAL HIGH (ref 78.0–100.0)
Platelets: 195 10*3/uL (ref 150–400)
RBC: 2.57 MIL/uL — ABNORMAL LOW (ref 4.22–5.81)
RDW: 18.8 % — AB (ref 11.5–15.5)
WBC: 8.3 10*3/uL (ref 4.0–10.5)

## 2015-05-13 LAB — APTT: APTT: 27 s (ref 24–37)

## 2015-05-13 LAB — GLUCOSE, CAPILLARY
GLUCOSE-CAPILLARY: 91 mg/dL (ref 65–99)
GLUCOSE-CAPILLARY: 79 mg/dL (ref 65–99)
GLUCOSE-CAPILLARY: 70 mg/dL (ref 65–99)
Glucose-Capillary: 16 mg/dL — CL (ref 65–99)
Glucose-Capillary: <10 mg/dL — CL (ref 65–99)
Glucose-Capillary: 94 mg/dL (ref 65–99)
Glucose-Capillary: 106 mg/dL — ABNORMAL HIGH (ref 65–99)

## 2015-05-13 LAB — PROCALCITONIN: Procalcitonin: 12.99 ng/mL

## 2015-05-13 LAB — LACTIC ACID, PLASMA
Lactic Acid, Venous: 8.3 mmol/L (ref 0.5–2.0)
Lactic Acid, Venous: 10.4 mmol/L (ref 0.5–2.0)

## 2015-05-13 LAB — URINE MICROSCOPIC-ADD ON

## 2015-05-13 LAB — MRSA PCR SCREENING: MRSA BY PCR: NEGATIVE

## 2015-05-13 LAB — PROTIME-INR
INR: 1.74 — AB (ref 0.00–1.49)
PROTHROMBIN TIME: 20.3 s — AB (ref 11.6–15.2)

## 2015-05-13 LAB — URINE CULTURE
CULTURE: NO GROWTH
SPECIAL REQUESTS: NORMAL

## 2015-05-13 LAB — ALBUMIN: Albumin: 2.8 g/dL — ABNORMAL LOW (ref 3.5–5.0)

## 2015-05-13 LAB — CREATININE, URINE, RANDOM: CREATININE, URINE: 236.29 mg/dL

## 2015-05-13 LAB — SODIUM, URINE, RANDOM: SODIUM UR: 19 mmol/L

## 2015-05-13 MED ORDER — FENTANYL CITRATE (PF) 100 MCG/2ML IJ SOLN
INTRAMUSCULAR | Status: AC
  Administered 2015-05-13: 100 ug
  Filled 2015-05-13: qty 4

## 2015-05-13 MED ORDER — FENTANYL CITRATE (PF) 100 MCG/2ML IJ SOLN
100.0000 ug | Freq: Once | INTRAMUSCULAR | Status: AC
  Administered 2015-05-13: 100 ug via INTRAVENOUS

## 2015-05-13 MED ORDER — MIDAZOLAM HCL 2 MG/2ML IJ SOLN
2.0000 mg | Freq: Once | INTRAMUSCULAR | Status: AC
  Administered 2015-05-13: 2 mg via INTRAVENOUS

## 2015-05-13 MED ORDER — DEXTROSE 50 % IV SOLN
50.0000 mL | Freq: Once | INTRAVENOUS | Status: AC
  Administered 2015-05-13: 50 mL via INTRAVENOUS

## 2015-05-13 MED ORDER — SODIUM CHLORIDE 0.9 % IV BOLUS (SEPSIS)
1000.0000 mL | Freq: Once | INTRAVENOUS | Status: AC
  Administered 2015-05-13: 1000 mL via INTRAVENOUS

## 2015-05-13 MED ORDER — ROCURONIUM BROMIDE 50 MG/5ML IV SOLN
INTRAVENOUS | Status: AC
  Filled 2015-05-13: qty 2

## 2015-05-13 MED ORDER — DEXTROSE 50 % IV SOLN
INTRAVENOUS | Status: AC
  Filled 2015-05-13: qty 50

## 2015-05-13 MED ORDER — HEPARIN SODIUM (PORCINE) 1000 UNIT/ML IJ SOLN
1000.0000 [IU] | Freq: Once | INTRAMUSCULAR | Status: AC
  Administered 2015-05-13: 1000 [IU]
  Filled 2015-05-13: qty 5

## 2015-05-13 MED ORDER — SODIUM CHLORIDE 0.9 % IV SOLN: INTRAVENOUS | Status: AC

## 2015-05-13 MED ORDER — LORAZEPAM 1 MG PO TABS
1.0000 mg | ORAL_TABLET | Freq: Three times a day (TID) | ORAL | Status: DC | PRN
  Administered 2015-05-13: 1 mg via ORAL
  Filled 2015-05-13: qty 1

## 2015-05-13 MED ORDER — ETOMIDATE 2 MG/ML IV SOLN
10.0000 mg | Freq: Once | INTRAVENOUS | Status: AC
  Administered 2015-05-13: 10 mg via INTRAVENOUS

## 2015-05-13 MED ORDER — SODIUM CHLORIDE 0.9 % IV SOLN
25.0000 ug/h | INTRAVENOUS | Status: DC
  Administered 2015-05-13: 50 ug/h via INTRAVENOUS
  Administered 2015-05-14: 275 ug/h via INTRAVENOUS
  Administered 2015-05-14: 200 ug/h via INTRAVENOUS
  Administered 2015-05-15: 250 ug/h via INTRAVENOUS
  Administered 2015-05-17 – 2015-05-18 (×3): 50 ug/h via INTRAVENOUS
  Administered 2015-05-18: 100 ug/h via INTRAVENOUS
  Filled 2015-05-13 (×7): qty 50

## 2015-05-13 MED ORDER — LIP MEDEX EX OINT
TOPICAL_OINTMENT | CUTANEOUS | Status: AC
  Administered 2015-05-13: 1
  Filled 2015-05-13: qty 7

## 2015-05-13 MED ORDER — SODIUM BICARBONATE 8.4 % IV SOLN
INTRAVENOUS | Status: DC
  Administered 2015-05-13 – 2015-05-14 (×2): via INTRAVENOUS
  Filled 2015-05-13 (×2): qty 150

## 2015-05-13 MED ORDER — LIDOCAINE HCL (CARDIAC) 20 MG/ML IV SOLN
INTRAVENOUS | Status: AC
  Filled 2015-05-13: qty 5

## 2015-05-13 MED ORDER — VANCOMYCIN HCL IN DEXTROSE 1-5 GM/200ML-% IV SOLN: 1000.0000 mg | INTRAVENOUS | Status: DC

## 2015-05-13 MED ORDER — FENTANYL CITRATE (PF) 100 MCG/2ML IJ SOLN: 50.0000 ug | Freq: Once | INTRAMUSCULAR | Status: DC

## 2015-05-13 MED ORDER — VANCOMYCIN HCL IN DEXTROSE 1-5 GM/200ML-% IV SOLN
1000.0000 mg | INTRAVENOUS | Status: DC
  Administered 2015-05-13: 1000 mg via INTRAVENOUS
  Filled 2015-05-13: qty 200

## 2015-05-13 MED ORDER — MIDAZOLAM HCL 2 MG/2ML IJ SOLN
2.0000 mg | INTRAMUSCULAR | Status: DC | PRN
  Administered 2015-05-13 – 2015-05-18 (×8): 2 mg via INTRAVENOUS
  Filled 2015-05-13 (×5): qty 2

## 2015-05-13 MED ORDER — ETOMIDATE 2 MG/ML IV SOLN
INTRAVENOUS | Status: AC
  Administered 2015-05-13: 20 mg
  Filled 2015-05-13: qty 20

## 2015-05-13 MED ORDER — PIPERACILLIN-TAZOBACTAM 3.375 G IVPB
3.3750 g | Freq: Three times a day (TID) | INTRAVENOUS | Status: DC
  Administered 2015-05-13 – 2015-05-14 (×2): 3.375 g via INTRAVENOUS
  Filled 2015-05-13 (×2): qty 50

## 2015-05-13 MED ORDER — SODIUM POLYSTYRENE SULFONATE 15 GM/60ML PO SUSP
30.0000 g | Freq: Once | ORAL | Status: AC
  Administered 2015-05-13: 30 g via ORAL
  Filled 2015-05-13: qty 120

## 2015-05-13 MED ORDER — MIDAZOLAM HCL 2 MG/2ML IJ SOLN
INTRAMUSCULAR | Status: AC
  Administered 2015-05-13: 2 mg
  Filled 2015-05-13: qty 4

## 2015-05-13 MED ORDER — PIPERACILLIN-TAZOBACTAM 3.375 G IVPB 30 MIN
3.3750 g | Freq: Once | INTRAVENOUS | Status: DC
  Filled 2015-05-13: qty 50

## 2015-05-13 MED ORDER — FENTANYL BOLUS VIA INFUSION
50.0000 ug | INTRAVENOUS | Status: DC | PRN
  Administered 2015-05-14 – 2015-05-18 (×5): 50 ug via INTRAVENOUS
  Administered 2015-05-18: 200 ug via INTRAVENOUS
  Filled 2015-05-13: qty 50

## 2015-05-13 MED ORDER — MIDAZOLAM HCL 2 MG/2ML IJ SOLN
2.0000 mg | INTRAMUSCULAR | Status: AC | PRN
  Administered 2015-05-14 (×2): 2 mg via INTRAVENOUS
  Filled 2015-05-13 (×5): qty 2

## 2015-05-13 MED ORDER — LORAZEPAM 2 MG/ML IJ SOLN
INTRAMUSCULAR | Status: AC
  Filled 2015-05-13: qty 1

## 2015-05-13 MED ORDER — CETYLPYRIDINIUM CHLORIDE 0.05 % MT LIQD
7.0000 mL | Freq: Two times a day (BID) | OROMUCOSAL | Status: DC
  Administered 2015-05-13 – 2015-05-15 (×5): 7 mL via OROMUCOSAL

## 2015-05-13 MED ORDER — SUCCINYLCHOLINE CHLORIDE 20 MG/ML IJ SOLN
INTRAMUSCULAR | Status: AC
  Filled 2015-05-13: qty 1

## 2015-05-13 MED ORDER — VANCOMYCIN HCL IN DEXTROSE 1-5 GM/200ML-% IV SOLN: 1000.0000 mg | Freq: Once | INTRAVENOUS | Status: DC

## 2015-05-13 NOTE — Progress Notes (Signed)
PT Cancellation Note  Patient Details Name: [name] MRN: [id number] DOB: [birth year]   Cancelled Treatment:    Reason Eval/Treat Not Completed: Patient declined, no reason specified (stated that he was cold and just covered his head.)   Claretha Cooper 05/13/2015, 12:06 PM Tresa Endo PT [phone]

## 2015-05-13 NOTE — Progress Notes (Addendum)
[place] for Infectious Disease   Date of Admission:  05/03/2015  Antibiotics: None, vancomycin/zosyn being started  Subjective: He is currently have acute onset SOB, tachycardia.  New.  Has had fever.    Objective:  CONSTITUTIONAL:alert ; in moderate respiratory distress Eyes: anicteric CV: tachy rr Lungs : CTA Abd: soft, nt, nd Ext: no edema Skin: same rash Neuro: non focal Filed Vitals:   05/13/15 1423  BP:   Pulse:   Temp: 101 F (38.3 C)  Resp:      A comprehensive review of systems was negative except for: Respiratory: positive for sob Gastrointestinal: positive for abdominal discomfort  All other systems reviewed and are negative   Lab Results Lab Results  Component Value Date   WBC 8.3 05/13/2015   HGB 9.8* 05/13/2015   HCT 27.7* 05/13/2015   MCV 107.8* 05/13/2015   PLT 195 05/13/2015    Lab Results  Component Value Date   CREATININE 3.56* 05/13/2015   BUN 53* 05/13/2015   NA 137 05/13/2015   K 5.0 05/13/2015   CL 109 05/13/2015   CO2 18* 05/13/2015    Lab Results  Component Value Date   ALT 518* 05/12/2015   AST 702* 05/12/2015   ALKPHOS 252* 05/12/2015   BILITOT 5.2* 05/12/2015      Microbiology: Recent Results (from the past 240 hour(s))  Culture, blood (routine x 2)     Status: None   Collection Time: 05/04/15  2:50 AM  Result Value Ref Range Status   Specimen Description BLOOD RIGHT ARM  Final   Special Requests BOTTLES DRAWN AEROBIC AND ANAEROBIC [place]  Final   Culture   Final    NO GROWTH 5 DAYS Performed at [facility]    Report Status 05/09/2015 FINAL  Final  Culture, blood (routine x 2)     Status: None   Collection Time: 05/04/15  2:55 AM  Result Value Ref Range Status   Specimen Description BLOOD RIGHT HAND  Final   Special Requests BOTTLES DRAWN AEROBIC ONLY 10CC  Final   Culture   Final    NO GROWTH 5 DAYS Performed at [facility]    Report Status 05/09/2015 FINAL  Final  Culture, Urine      Status: None   Collection Time: 05/04/15 10:57 PM  Result Value Ref Range Status   Specimen Description URINE, RANDOM  Final   Special Requests NONE  Final   Culture   Final    NO GROWTH 1 DAY Performed at [facility]    Report Status 05/06/2015 FINAL  Final  Culture, Urine     Status: None   Collection Time: 05/11/15  4:21 PM  Result Value Ref Range Status   Specimen Description URINE, CLEAN CATCH  Final   Special Requests Normal  Final   Culture   Final    NO GROWTH 2 DAYS Performed at [facility]    Report Status 05/13/2015 FINAL  Final  Tissue culture     Status: None (Preliminary result)   Collection Time: 05/12/15  9:50 AM  Result Value Ref Range Status   Specimen Description BIOPSY BONE MARROW  Final   Special Requests NONE  Final   Gram Stain   Final    MODERATE WBC PRESENT,BOTH PMN AND MONONUCLEAR NO SQUAMOUS EPITHELIAL CELLS SEEN NO ORGANISMS SEEN Performed at [facility]    Culture   Final    FEW ESCHERICHIA COLI Performed at [facility]    Report Status PENDING  Incomplete  Fungus culture w smear     Status: None (Preliminary result)   Collection Time: 05/12/15  9:50 AM  Result Value Ref Range Status   Specimen Description BIOPSY BONE MARROW  Final   Special Requests NONE  Final   Fungal Smear   Final    NO YEAST OR FUNGAL ELEMENTS SEEN Performed at [facility]    Culture   Final    CULTURE IN PROGRESS FOR FOUR WEEKS Performed at [facility]    Report Status PENDING  Incomplete    Studies/Results: Ct Biopsy  05/12/2015  CLINICAL DATA:  59 year old male of uncertain hematologic diagnosis. He has been referred for bone marrow biopsy. EXAM: CT-GUIDED BONE MARROW BIOPSY MEDICATIONS AND MEDICAL HISTORY: Versed 1.5 mg, Fentanyl 50 mcg. Additional Medications: None. ANESTHESIA/SEDATION: Moderate sedation time: 22 minutes PROCEDURE: The procedure risks, benefits, and alternatives were explained to the  patient. Questions regarding the procedure were encouraged and answered. The patient understands and consents to the procedure. Scout CT of the pelvis was performed for surgical planning purposes. The posterior pelvis was prepped with Betadinein a sterile fashion, and a sterile drape was applied covering the operative field. A sterile gown and sterile gloves were used for the procedure. Local anesthesia was provided with 1% Lidocaine. We targeted the right posterior iliac bone for biopsy. The skin and subcutaneous tissues were infiltrated with 1% lidocaine without epinephrine. A small stab incision was made with an 11 blade scalpel, and an 11 gauge Murphy needle was advanced with CT guidance to the posterior cortex. Manual forced was used to advance the needle through the posterior cortex and the stylet was removed. A bone marrow aspirate was retrieved and passed to a cytotechnologist in the room. The Murphy needle was then advanced without the stylet for a core biopsy. Multiple core biopsy were attempted to be retrieved, with a paucity of marrow retrieved. Core biopsy was sent for pathology and for culture. Manual pressure was used for hemostasis and a sterile dressing was placed. No complications were encountered no significant blood loss was encountered. Patient tolerated the procedure well and remained hemodynamically stable throughout. FINDINGS: Scout image demonstrates safe approach to posterior iliac bone. Images during the case demonstrate placement of 11 gauge Murphy needle COMPLICATIONS: None IMPRESSION: Status post CT-guided bone marrow biopsy, with tissue specimen sent to pathology for complete histopathologic analysis Signed, Dulcy Fanny. Earleen Newport, DO [facility] Radiology Electronically Signed   By: Corrie Mckusick D.O.   On: 05/12/2015 13:12    Assessment/Plan:  1) acute respiratory distress with tachycardia - unclear etiology.  With the eosinophilia,  could be infiltrative and include myocarditis.   I will check an Echo, ECG, CXR  2) eosinophilia - unclear etiology.  Does not appear to be infectious.  Now at the severe range.  Could be assoicated with lymphoma, hypereosinophilic syndrome.  As above, could be cardiac in origin.    3) leukocytosis - resolved.  Broad spectrum antibiotics being started by primary but no signs of bacterial infection or sepsis.    Scharlene Gloss, [place] for Infectious Disease [place] [email or web address] [phone] pager   [phone] cell 05/13/2015, 2:37 PM

## 2015-05-13 NOTE — Progress Notes (Signed)
Hypoglycemic Event  CBG: 16 at 17:20  Treatment: D50 IV 50 mL  Symptoms: Shaky and Nervous/irritable  Follow-up CBG: Time: 1903 CBG Result: 79  Possible Reasons for Event: Inadequate meal intake and Change in activity  Comments/MD notified: V. Sood notified. D50 was given. CBG q1 hour.     Lynnell Dike

## 2015-05-13 NOTE — Progress Notes (Signed)
IV team called. PICC not able to be placed until tomorrow. 2 PIV attempted. MD made aware.

## 2015-05-13 NOTE — Progress Notes (Addendum)
Name: [name] MRN: [id number] DOB: [birth year]    ADMISSION DATE:  05/03/2015 CONSULTATION DATE:  05/13/15  REFERRING MD :  Dr. Doyle Askew   CHIEF COMPLAINT:  Fever of unknown origin, tachypnea/tachycardia  BRIEF PATIENT DESCRIPTION: 59 y/o M admitted 10/16 with complaints of neck swelling.  He was also seen in the ER on 10/13 for nausea / vomiting, sore throat, diarrhea and rash.  He was treated for a possible allergic reaction and released.      SIGNIFICANT EVENTS  10/13  Seen in ER for N/V, sore throat, diarrhea & rash 10/16  Admit to [facility] for facial swelling, cough with frothy yellow sputum, sore throat and hives.  STUDIES:  10/13  RUQ [place] >> mild gallbladder wall thickening, no gallstones observed 10/16  CXR >> no acute infiltrate 10/16  CT Soft tissue Neck >> asymmetric enlargement of R submandibular gland relative to the left is non-specific but could be due to infection, inflammation or tumor.  Negative for abscess, diffuse subcutaneous edema 10/17  MRI Soft Tissue Neck >> motion degraded study, no discrete mass, grossly similar subcutaneous edema involving the bilateral submandibular spaces (nonspecific but must consider infection/cellulitis), no discrete abscess  10/21  CXR >> no acute infiltrate 10/21  CT Abd/Pelvis >> diffuse body wall edema suggestive of anasarca, mesenteric edema but no overt ascites, no acute intra-abdominal abnormalities, stable prostate gland enlargement and small hiatal hernia    HISTORY OF PRESENT ILLNESS:  59 y/o M, homeless male (lives in a hotel) with PMH of HTN, NSTEMI, HLD, arthritis, anxiety / depression, former polysubstance abuse with cardiomyopathy (EF20%), CKD IV and medical non-compliance who presented to [facility] on 10/16 with complaints of neck/face swelling with hives.    He was seen previously in the ER on 10/13 for nausea / vomiting, sore throat, diarrhea and rash.  The patient was treated for a possible allergic reaction and released.  He  had a RUQ [place] that showed contracted gallbladder with thickening likely secondary to this. It is no right upper quadrant fluid and no signs of biliary ductal dilatation  On 10/16, he initially reported facial swelling, cough with frothy yellow sputum, sore throat and hives.  He reported ongoing facial swelling after 10/13 discharge.  He denied difficulty breathing or swallowing.  Labs at that time - Na 131, K 4.8, Sr Cr 2.26, glucose 63, AST 299, ALT 283, bilirubin 2.0, BNP 752, Troponin 0.05, lipase 16, WBC 16.8, Hgb 17.8, and platelets 253.  Differential showed 10.4 lymph's, eosinophils 1.8, crenated RBC's and atypical lymphocytes.   Initial CXR without acute process.  Neck swelling was evaluated with a CT of the neck was evaluated which showed asymmetric enlargement of R submandibular gland relative to the left is non-specific but could be due to infection, inflammation or tumor.  Negative for abscess. Diffuse subcutaneous edema and follow up MRI recommended.  MRI of the soft tissues of neck showed no discrete mass or abscess.   UDS and alcohol level on admission were normal. Hepatitis A, B, and C serologies were nonrevealing.HIDA scan showed normal filling of the gallbladder with radiotracer, indicating cystic duct patency, which is not consistent with acute cholecystitis. Prompt radiotracer excretion into the common bile duct and small bowel, with no evidence of biliary obstruction. Normal liver radiotracer uptake and excretion.  Patient reports that he had been drinking prior to admission he has several beers daily and vodka (noted macrocytic anemia on CBC). He reported that he had been having some epigastric and right  upper quadrant pain for several days prior to admission. This was associated with nausea which tended to be worse on an empty stomach, and occasional vomiting. He had no radiation of this pain into his back. He has been having heartburn for 5 days a week for months for which he will use  Rolaids.  Follow up lab work on the 17th noted his LFTs trending down, but on the 19th he was noted to have a total bili of 3, ALT 419, AST 547, and alkaline phosphatase 155.  10/20 he was noted to have total bili 3.2, ALT 461, AST 561, alkaline phosphatase 197.    The patient has also had intermittent fever and rash.  Interestingly, WBC reduced when antibiotics were stopped.  Eosinophils rose to 4.1K on 10/25.  On 10/26, he developed tachycardia, tachypnea and fever to 101.  Current labs: WBC 8.1, hgb 9.8, MCV 107/109.  HIV negative.  Blood cultures negative to date.  To further work up fever, on 10/26 he underwent a bone marrow biopsy.  He developed fever, tachypnea and tachycardia on 10/26 and was transferred to ICU.      SUBJECTIVE:   VITAL SIGNS: Temp:  [97.4 F (36.3 C)-101 F (38.3 C)] 99.1 F (37.3 C) (10/26 2000) Pulse Rate:  [29-97] 49 (10/26 1913) Resp:  [20-39] 33 (10/26 1913) BP: (93-132)/(52-114) 132/114 mmHg (10/26 1913) SpO2:  [93 %-100 %] 97 % (10/26 1913) Weight:  [80.7 kg (177 lb 14.6 oz)-82.2 kg (181 lb 3.5 oz)] 82.2 kg (181 lb 3.5 oz) (10/26 1500)  PHYSICAL EXAMINATION: General:  Ill appearing adult male in NAD  Neuro:  AAOx4, speech clear, MAE HEENT:  MM pink/moist, no JVD, sclera icterus  Cardiovascular:  s1s2 rrr, no m/r/g, tachy  Lungs:  Tachypnea, non-labored, lungs bilaterally clear, diminished lower  Abdomen:  NTND, bsx4 active  Musculoskeletal:  No acute deformities  Skin: generalized edema, erythema, areas of peeling on extremities, fine raised rash on chest wall/neck, pale grey in appearance   Recent Labs Lab 05/13/15 0535 05/13/15 1130 05/13/15 1624  NA 138 137 138  K 5.6* 5.0 6.1*  CL 111 109 107  CO2 19* 18* 13*  BUN 52* 53* 55*  CREATININE 3.15* 3.56* 3.78*  GLUCOSE 99 115* 41*    Recent Labs Lab 05/12/15 0516 05/13/15 0535 05/13/15 1624  HGB 9.9* 9.8* 11.4*  HCT 27.1* 27.7* 31.3*  WBC 15.9* 8.3 13.0*  PLT 217 195 246   Ct  Biopsy  05/12/2015  CLINICAL DATA:  59 year old male of uncertain hematologic diagnosis. He has been referred for bone marrow biopsy. EXAM: CT-GUIDED BONE MARROW BIOPSY MEDICATIONS AND MEDICAL HISTORY: Versed 1.5 mg, Fentanyl 50 mcg. Additional Medications: None. ANESTHESIA/SEDATION: Moderate sedation time: 22 minutes PROCEDURE: The procedure risks, benefits, and alternatives were explained to the patient. Questions regarding the procedure were encouraged and answered. The patient understands and consents to the procedure. Scout CT of the pelvis was performed for surgical planning purposes. The posterior pelvis was prepped with Betadinein a sterile fashion, and a sterile drape was applied covering the operative field. A sterile gown and sterile gloves were used for the procedure. Local anesthesia was provided with 1% Lidocaine. We targeted the right posterior iliac bone for biopsy. The skin and subcutaneous tissues were infiltrated with 1% lidocaine without epinephrine. A small stab incision was made with an 11 blade scalpel, and an 11 gauge Murphy needle was advanced with CT guidance to the posterior cortex. Manual forced was used to advance the needle  through the posterior cortex and the stylet was removed. A bone marrow aspirate was retrieved and passed to a cytotechnologist in the room. The Murphy needle was then advanced without the stylet for a core biopsy. Multiple core biopsy were attempted to be retrieved, with a paucity of marrow retrieved. Core biopsy was sent for pathology and for culture. Manual pressure was used for hemostasis and a sterile dressing was placed. No complications were encountered no significant blood loss was encountered. Patient tolerated the procedure well and remained hemodynamically stable throughout. FINDINGS: Scout image demonstrates safe approach to posterior iliac bone. Images during the case demonstrate placement of 11 gauge Murphy needle COMPLICATIONS: None IMPRESSION: Status  post CT-guided bone marrow biopsy, with tissue specimen sent to pathology for complete histopathologic analysis Signed, Dulcy Fanny. Earleen Newport, DO [facility] Radiology Electronically Signed   By: Corrie Mckusick D.O.   On: 05/12/2015 13:12   Dg Chest Port 1 View  05/13/2015  CLINICAL DATA:  Acute renal failure and congestive heart failure. Central line placement. EXAM: PORTABLE CHEST 1 VIEW COMPARISON:  05/13/2015 FINDINGS: A new right jugular central venous catheter is seen with tip in the mid SVC. No evidence of pneumothorax. Mild cardiomegaly again noted.  Both lungs are clear. IMPRESSION: New right jugular central venous catheter in appropriate position. No evidence of pneumothorax or other acute findings. Electronically Signed   By: Earle Gell M.D.   On: 05/13/2015 18:17   Dg Chest Port 1 View  05/13/2015  CLINICAL DATA:  59 year old male with sepsis. Shortness of breath and weakness. Initial encounter. EXAM: PORTABLE CHEST 1 VIEW COMPARISON:  Portable AP semi upright view at 1432 hours. 08/06/2014 and earlier. FINDINGS: Portable AP semi upright view at 1432 hours. Stable cardiomegaly and mediastinal contours. Stable lung volumes. Allowing for portable technique, the lungs are clear. No pneumothorax or pleural effusion. IMPRESSION: No acute cardiopulmonary abnormality. Electronically Signed   By: Genevie Ann M.D.   On: 05/13/2015 14:53   Dg Abd Portable 2v  05/13/2015  CLINICAL DATA:  Vomiting and generalized abdominal pain. EXAM: PORTABLE ABDOMEN - 2 VIEW COMPARISON:  CT 05/08/2015 FINDINGS: Negative for free air on the left lateral decubitus image. Gaseous distention of stomach with an air-fluid level. There is evidence for gas and stool in the colon. Nonobstructive bowel gas pattern. IMPRESSION: Gaseous distension of the stomach, otherwise, unremarkable abdominal examination. Electronically Signed   By: Markus Daft M.D.   On: 05/13/2015 16:42    ASSESSMENT  / PLAN:  PULMONARY A: Acute respiratory failure due to metabolic acidosis P:   - discussed risk for overt respiratory failure with the patient. I suspect that he will fatigue and fail this pm without resp support.  - I have recommended intubation and mechanical ventilation. Pt is hesitant about doing this but understands the reasoning and is considering.   CARDIOVASCULAR R IJ HD catheter 10/26 >>  A:  Relative hypotension Moderate mitral regurgitation Dilated cardiomyopathy  Hx CAD Hx HTN P:  - volume support - consider possible infiltrative process, myocarditis.   RENAL A:  Acute renal failure.  Acute metabolic acidosis due to the above and to lactic acidosis.  P:   - will likely need HD this pm, suspect CVVHD - agree with temporary bicarb gtt, goal d/c once resp status stabilized.  - will need to further eval for source lactic acidosis, ? Consider repeat Ct abdomen to look for occult infxn. No other obvious ischemic source seen. Consider resp muscle  fatigue as lactate source  GASTROINTESTINAL A:  ? EtOH liver disease P:   - Follow transaminases - note RUQ [place] and CT abd and other hepatic w/u above  HEMATOLOGIC A:  Peripheral eosinophilia.  Entire presentation is suspicious for Churg-Strauss, although no pulmonary infiltrates.  P:  - await results from BM bx - suspect either Churg-Strauss or a toxic drug reaction - will check ANCA and a-MPO Ab's, follow eosinophils - IgE ordered, consider paracytic infxn - will need to balance risk of infxn vs risk of drug rxn to abx.  - need to consider empiric high dose steroids  INFECTIOUS A:  Fever Suspected recent submandibular cellulitis  GNR on bone marrow bx ??  P:   BC 10/17 >> negative UC 10/17 >> negative BCx2 10/26 >>  UC 10/26 >>  Bone marrow 10/26 >> rare E coli (? Contaminant)  Stool O&P 10/26 >>  HIV 10/21 >> negative CMV 10/21 >. Negative EBV 10/21 >> negative   clinda x 1 10/17  vanco 10/17 >> 10/21  Zosyn  10/17 >> 10/21 vanco 10/26 >>  Zosyn 10/26 >>   - Continue empiric abx although possible that this is non-infectios inflammatory process. Importance of the GNR on BM Bx is not clear at this time. Consider change abx to alternative > carbapenem and linezolid if rash continues and suspicion for toxic rxn goes up.   DERMATOLOGY:  A: skin peeling rash, without clear involvement of lower layers, palpable  P: - will need a skin bx, particularly if region of bullous disease or sloughing develops.   ENDOCRINE Hypoglycemia P:   - follow CBGs  NEUROLOGIC A:  No focal issues P:   RASS goal: 0   FAMILY  - Updates: updated niece and brother at bedside as well as the patient. Explained the indications for intubation and MV  - Inter-disciplinary family meet or Palliative Care meeting due by:  05/18/15     TODAY'S SUMMARY:     Independent CC time 16 minutes  Attending Note:  58 year old male with history of drug abuse who presents with diffuse anasarca, raised gray rash across the chest, severe eosinophilia, tachypnea and tachycardia. The patient was transferred to the ICU for tachypnea, tachycardia and fever of unknown origin. Lungs clear to auscultation on exam. Rash over the chest papular and gray in color noted. I reviewed CXR myself and was clear. Discussed with PCCM-NP and ID.   Fever/tachycardia/tachypnea: likely early sepsis.  - Pan cultures.  - Abx as ordered.  - ID following.  - Hydrate.   Eosinophilia: concern for lymphoma or leukemia.  - Bone marrow biopsy.  - F/U CBC.  - Check IgE level.  - Check stool for parasite and ova.   Diffuse anasarca: history of etoh, ?liver disease and low albumin.  - Check albumin level.  - F/U LFTs.  - Check U/A for proteinuria.   Hypoxemia:  - Titrate O2 for sat of 88-92%.   Check IgE, if elevated likely infectious vs allergic, if negative would point more towards and hematologic reaction. Further plan as above.   Patient seen and  examined, agree with above note. I dictated the care and orders written for this patient under my direction.   Rush Farmer, MD  [phone]

## 2015-05-13 NOTE — Progress Notes (Signed)
CRITICAL VALUE ALERT  Critical value received:  K+ 6.1, Glucose 41   Date of notification:  05/13/2015  Time of notification:  17:11  Critical value read back:Yes.    Nurse who received alert:  Epimenio Sarin  MD notified (1st page):  Governor Rooks, MD   Time of first page:  17:12  MD notified (2nd page):  Time of second page:  Responding MD:  Governor Rooks, MD   Time MD responded:  17:12

## 2015-05-13 NOTE — Procedures (Signed)
Intubation Procedure Note [name] [id number] 23-Feb-1956  Procedure: Intubation Indications: Respiratory insufficiency  Procedure Details Consent: Risks of procedure as well as the alternatives and risks of each were explained to the (patient/caregiver).  Consent for procedure obtained. Time Out: Verified patient identification, verified procedure, site/side was marked, verified correct patient position, special equipment/implants available, medications/allergies/relevent history reviewed, required imaging and test results available.  Performed  Maximum sterile technique was used including gloves, gown, hand hygiene and sheet.  MAC-3 Glidescope 8.0ETT placed using Mac-3 Glidescope on first pass. Location confirmed by direct visualization, ETCO2, auscultation.   Evaluation Hemodynamic Status: Transient hypotension treated with fluid; O2 sats: stable throughout Patient's Current Condition: stable Complications: Complications of Emesis prior to intubation attempt, suctioned successfully.  Patient did tolerate procedure well. Chest X-ray ordered to verify placement.  CXR: pending.   [name] S. 05/13/2015

## 2015-05-13 NOTE — Progress Notes (Signed)
  Echocardiogram 2D Echocardiogram has been performed.  Jennette Dubin 05/13/2015, 5:24 PM

## 2015-05-13 NOTE — Progress Notes (Addendum)
PCCM Interval Progress Note  Events:   1.  Fingerstick glucose 10. 2. Lactate 8.3 - no repeat ordered.  Interventions: 1. 1amp D50. CBG's q1hr. 2. Trend lactate.   Montey Hora, [place] [facility] Pager: [phone]  or [phone] 05/13/2015, 6:18 PM

## 2015-05-13 NOTE — Progress Notes (Signed)
CRITICAL VALUE ALERT  Critical value received:  Lactic Acid 8.3  Date of notification:  05/13/2015  Time of notification:  17:05  Critical value read back:Yes.    Nurse who received alert:  Epimenio Sarin  MD notified (1st page):  Governor Rooks, MD  Time of first page:  17:06  MD notified (2nd page):  Time of second page:  Responding MD:  Governor Rooks, MD   Time MD responded:  17:06

## 2015-05-13 NOTE — Progress Notes (Signed)
Pt transferring to ICU stepdown, report called.  Rosine Beat, RN

## 2015-05-13 NOTE — Procedures (Signed)
Central Venous Catheter Insertion Procedure Note Ronne Mezger [id number] [birth year]  Procedure: Insertion of Central Venous Catheter Indications: Dialysis access  Procedure Details Consent: Risks of procedure as well as the alternatives and risks of each were explained to the (patient/caregiver).  Consent for procedure obtained. Time Out: Verified patient identification, verified procedure, site/side was marked, verified correct patient position, special equipment/implants available, medications/allergies/relevent history reviewed, required imaging and test results available.  Performed  Maximum sterile technique was used including antiseptics, cap, gloves, gown, hand hygiene, mask and sheet. Skin prep: Chlorhexidine; local anesthetic administered A antimicrobial bonded/coated triple lumen catheter was placed in the right internal jugular vein using the Seldinger technique.  Evaluation Blood flow good Complications: No apparent complications Patient did tolerate procedure well. Chest X-ray ordered to verify placement.  CXR: pending.  Montey Hora, PA performed the procedure with my assistance.  U/S used in placement.  YACOUB,WESAM 05/13/2015, 5:39 PM

## 2015-05-13 NOTE — Consult Note (Addendum)
Name: [name] MRN: [id number] DOB: [birth year]    ADMISSION DATE:  05/03/2015 CONSULTATION DATE:  05/13/15  REFERRING MD :  Dr. Doyle Askew   CHIEF COMPLAINT:  Fever of unknown origin, tachypnea/tachycardia  BRIEF PATIENT DESCRIPTION: 59 y/o M admitted 10/16 with complaints of neck swelling.  He was also seen in the ER on 10/13 for nausea / vomiting, sore throat, diarrhea and rash.  He was treated for a possible allergic reaction and released.      SIGNIFICANT EVENTS  10/13  Seen in ER for N/V, sore throat, diarrhea & rash 10/16  Admit to [facility] for facial swelling, cough with frothy yellow sputum, sore throat and hives.  STUDIES:  10/13  RUQ [place] >> mild gallbladder wall thickening, no gallstones observed 10/16  CXR >> no acute infiltrate 10/16  CT Soft tissue Neck >> asymmetric enlargement of R submandibular gland relative to the left is non-specific but could be due to infection, inflammation or tumor.  Negative for abscess, diffuse subcutaneous edema 10/17  MRI Soft Tissue Neck >> motion degraded study, no discrete mass, grossly similar subcutaneous edema involving the bilateral submandibular spaces (nonspecific but must consider infection/cellulitis), no discrete abscess  10/21  CXR >> no acute infiltrate 10/21  CT Abd/Pelvis >> diffuse body wall edema suggestive of anasarca, mesenteric edema but no overt ascites, no acute intra-abdominal abnormalities, stable prostate gland enlargement and small hiatal hernia    HISTORY OF PRESENT ILLNESS:  59 y/o M, homeless male (lives in a hotel) with PMH of HTN, NSTEMI, HLD, arthritis, anxiety / depression, former polysubstance abuse with cardiomyopathy (EF20%), CKD IV and medical non-compliance who presented to [facility] on 10/16 with complaints of neck/face swelling with hives.    He was seen previously in the ER on 10/13 for nausea / vomiting, sore throat, diarrhea and rash.  The patient was treated for a possible allergic reaction and released.  He  had a RUQ [place] that showed contracted gallbladder with thickening likely secondary to this. It is no right upper quadrant fluid and no signs of biliary ductal dilatation  On 10/16, he initially reported facial swelling, cough with frothy yellow sputum, sore throat and hives.  He reported ongoing facial swelling after 10/13 discharge.  He denied difficulty breathing or swallowing.  Labs at that time - Na 131, K 4.8, Sr Cr 2.26, glucose 63, AST 299, ALT 283, bilirubin 2.0, BNP 752, Troponin 0.05, lipase 16, WBC 16.8, Hgb 17.8, and platelets 253.  Differential showed 10.4 lymph's, eosinophils 1.8, crenated RBC's and atypical lymphocytes.   Initial CXR without acute process.  Neck swelling was evaluated with a CT of the neck was evaluated which showed asymmetric enlargement of R submandibular gland relative to the left is non-specific but could be due to infection, inflammation or tumor.  Negative for abscess. Diffuse subcutaneous edema and follow up MRI recommended.  MRI of the soft tissues of neck showed no discrete mass or abscess.   UDS and alcohol level on admission were normal. Hepatitis A, B, and C serologies were nonrevealing.HIDA scan showed normal filling of the gallbladder with radiotracer, indicating cystic duct patency, which is not consistent with acute cholecystitis. Prompt radiotracer excretion into the common bile duct and small bowel, with no evidence of biliary obstruction. Normal liver radiotracer uptake and excretion.  Patient reports that he had been drinking prior to admission he has several beers daily and vodka (noted macrocytic anemia on CBC). He reported that he had been having some epigastric and right  upper quadrant pain for several days prior to admission. This was associated with nausea which tended to be worse on an empty stomach, and occasional vomiting. He had no radiation of this pain into his back. He has been having heartburn for 5 days a week for months for which he will use  Rolaids.  Follow up lab work on the 17th noted his LFTs trending down, but on the 19th he was noted to have a total bili of 3, ALT 419, AST 547, and alkaline phosphatase 155.  10/20 he was noted to have total bili 3.2, ALT 461, AST 561, alkaline phosphatase 197.    The patient has also had intermittent fever and rash.  Interestingly, WBC reduced when antibiotics were stopped.  Eosinophils rose to 4.1K on 10/25.  On 10/26, he developed tachycardia, tachypnea and fever to 101.  Current labs: WBC 8.1, hgb 9.8, MCV 107/109.  HIV negative.  Blood cultures negative to date.  To further work up fever, on 10/26 he underwent a bone marrow biopsy.  He developed fever, tachypnea and tachycardia on 10/26 and was transferred to ICU.      PAST MEDICAL HISTORY :   has a past medical history of Hypertension; Homelessness; Urinary hesitancy; Hypercholesterolemia; NSTEMI (non-ST elevated myocardial infarction) ([place]) (01/07/2015); Heart attack ([place]) (07/2014); Walking pneumonia (07/2014); Headache; Arthritis; Anxiety; Depression; Noncompliance; Polysubstance abuse; Ischemic dilated cardiomyopathy; CKD (chronic kidney disease), stage IV ([place]); and History of echocardiogram (12/2014).  has past surgical history that includes left heart catheterization with coronary angiogram (N/A, 08/15/2014) and Cardiac catheterization.   Prior to Admission medications   Medication Sig Start Date End Date Taking? Authorizing Provider  aspirin EC 81 MG tablet Take 81 mg by mouth daily.   Yes Historical Provider, MD  atorvastatin (LIPITOR) 80 MG tablet Take 80 mg by mouth daily.   Yes Historical Provider, MD  carvedilol (COREG) 3.125 MG tablet Take 1 tablet (3.125 mg total) by mouth 2 (two) times daily with a meal. Discontinue 6.57m 03/24/15  Yes EArnoldo Morale MD  clopidogrel (PLAVIX) 75 MG tablet Take 1 tablet (75 mg total) by mouth daily. 01/09/15  Yes MCharolette Forward MD  colchicine 0.6 MG tablet Take 2 tabs (1.254m PO at the onset of a gout  attack, may repeat 1 tab(0.34m73man hour later, max 1.8mg77mtient taking differently: Take 1.2 mg by mouth daily as needed (gout). Take 2 tabs (1.2mg)634m at the onset of a gout attack, may repeat 1 tab(0.34mg) 25mhour later, max 1.8mg 9/60m6  Yes EnobongArnoldo MoraleiphenhydrAMINE (BENADRYL) 25 MG tablet Take 1 tablet (25 mg total) by mouth every 6 (six) hours. 04/30/15 05/03/15 Yes Erin ScGareth Morganurosemide (LASIX) 40 MG tablet Take 1.5 tablets (60 mg total) by mouth 2 (two) times daily. 03/31/15  Yes EnobongArnoldo Moralesosorbide mononitrate (IMDUR) 30 MG 24 hr tablet Take 15-30 mg by mouth daily.    Yes Historical Provider, MD  isosorbide-hydrALAZINE (BIDIL) 20-37.5 MG per tablet Take 1 tablet by mouth 2 (two) times daily. 03/10/15  Yes Mohan HCharolette Forwardirabegron ER (MYRBETRIQ) 25 MG TB24 tablet Take 25 mg by mouth daily.   Yes Historical Provider, MD  nitroGLYCERIN (NITROSTAT) 0.4 MG SL tablet Place 1 tablet (0.4 mg total) under the tongue every 5 (five) minutes x 3 doses as needed for chest pain. 08/16/14  Yes Ajay KaDixie Dialsamipril (ALTACE) 1.25 MG capsule Take 1.25 mg by mouth daily.   Yes Historical Provider, MD  ranitidine (ZANTAC) 150 MG capsule Take 1 capsule (150 mg total) by mouth 2 (two) times daily. 04/30/15  Yes Gareth Morgan, MD  traMADol (ULTRAM) 50 MG tablet Take 1 tablet (50 mg total) by mouth every 12 (twelve) hours as needed. Patient taking differently: Take 50 mg by mouth every 12 (twelve) hours as needed for moderate pain.  03/31/15  Yes [name], MD  allopurinol (ZYLOPRIM) 300 MG tablet Take 1 tablet (300 mg total) by mouth daily. Patient not taking: Reported on 05/03/2015 03/24/15   [name], MD  atorvastatin (LIPITOR) 20 MG tablet Take 1 tablet (20 mg total) by mouth daily. Patient not taking: Reported on 04/30/2015 09/07/14   Charolette Forward, MD  digoxin (LANOXIN) 0.25 MG tablet Take 1 tablet (0.25 mg total) by mouth daily. Patient not taking: Reported on  05/03/2015 09/07/14   Charolette Forward, MD   No Known Allergies  FAMILY HISTORY:  family history is not on file.   SOCIAL HISTORY:  reports that he quit smoking about 11 years ago. His smoking use included Cigarettes. He has a 45 pack-year smoking history. He has never used smokeless tobacco. He reports that he drinks alcohol. He reports that he does not use illicit drugs.  REVIEW OF SYSTEMS:   Constitutional: Negative for weight loss, malaise/fatigue and diaphoresis. Reports fevers / chills HENT: Negative for hearing loss, ear pain, nosebleeds, congestion, sore throat, neck pain, tinnitus and ear discharge.   Eyes: Negative for blurred vision, double vision, photophobia, pain, discharge and redness.  Respiratory: Negative for cough, hemoptysis, sputum production, wheezing and stridor.  Reports mild SOB Cardiovascular: Negative for chest pain, palpitations, orthopnea, claudication, leg swelling and PND.  Gastrointestinal: Negative for heartburn, nausea, vomiting, abdominal pain, diarrhea, constipation, blood in stool and melena.  Genitourinary: Negative for dysuria, urgency, frequency, hematuria and flank pain.  Musculoskeletal: Negative for myalgias, back pain, joint pain and falls.  Skin: Negative for itching.  Reports rash, redness, swelling.  Neurological: Negative for dizziness, tingling, tremors, sensory change, speech change, focal weakness, seizures, loss of consciousness, weakness and headaches.  Endo/Heme/Allergies: Negative for environmental allergies and polydipsia. Does not bruise/bleed easily.  SUBJECTIVE:   VITAL SIGNS: Temp:  [97.4 F (36.3 C)-101 F (38.3 C)] 101 F (38.3 C) (10/26 1423) Pulse Rate:  [64-108] 64 (10/26 0449) Resp:  [20-26] 26 (10/26 1413) BP: (93-132)/(52-96) 132/96 mmHg (10/26 1413) SpO2:  [100 %] 100 % (10/26 1413) Weight:  [177 lb 14.6 oz (80.7 kg)] 177 lb 14.6 oz (80.7 kg) (10/26 0449)  PHYSICAL EXAMINATION: General:  Ill appearing adult male in  NAD  Neuro:  AAOx4, speech clear, MAE HEENT:  MM pink/moist, no JVD, sclera icterus  Cardiovascular:  s1s2 rrr, no m/r/g, tachy  Lungs:  Tachypnea, non-labored, lungs bilaterally clear, diminished lower  Abdomen:  NTND, bsx4 active  Musculoskeletal:  No acute deformities  Skin: generalized edema, erythema, areas of peeling on extremities, fine raised rash on chest wall/neck, pale grey in appearance   Recent Labs Lab 05/12/15 1000 05/13/15 0535 05/13/15 1130  NA 138 138 137  K 5.7* 5.6* 5.0  CL 112* 111 109  CO2 19* 19* 18*  BUN 49* 52* 53*  CREATININE 3.02* 3.15* 3.56*  GLUCOSE 76 99 115*    Recent Labs Lab 05/10/15 0540 05/12/15 0516 05/13/15 0535  HGB 10.6* 9.9* 9.8*  HCT 30.7* 27.1* 27.7*  WBC 23.5* 15.9* 8.3  PLT 221 217 195   Ct Biopsy  05/12/2015  CLINICAL DATA:  59 year old male of uncertain  hematologic diagnosis. He has been referred for bone marrow biopsy. EXAM: CT-GUIDED BONE MARROW BIOPSY MEDICATIONS AND MEDICAL HISTORY: Versed 1.5 mg, Fentanyl 50 mcg. Additional Medications: None. ANESTHESIA/SEDATION: Moderate sedation time: 22 minutes PROCEDURE: The procedure risks, benefits, and alternatives were explained to the patient. Questions regarding the procedure were encouraged and answered. The patient understands and consents to the procedure. Scout CT of the pelvis was performed for surgical planning purposes. The posterior pelvis was prepped with Betadinein a sterile fashion, and a sterile drape was applied covering the operative field. A sterile gown and sterile gloves were used for the procedure. Local anesthesia was provided with 1% Lidocaine. We targeted the right posterior iliac bone for biopsy. The skin and subcutaneous tissues were infiltrated with 1% lidocaine without epinephrine. A small stab incision was made with an 11 blade scalpel, and an 11 gauge Murphy needle was advanced with CT guidance to the posterior cortex. Manual forced was used to advance the  needle through the posterior cortex and the stylet was removed. A bone marrow aspirate was retrieved and passed to a cytotechnologist in the room. The Murphy needle was then advanced without the stylet for a core biopsy. Multiple core biopsy were attempted to be retrieved, with a paucity of marrow retrieved. Core biopsy was sent for pathology and for culture. Manual pressure was used for hemostasis and a sterile dressing was placed. No complications were encountered no significant blood loss was encountered. Patient tolerated the procedure well and remained hemodynamically stable throughout. FINDINGS: Scout image demonstrates safe approach to posterior iliac bone. Images during the case demonstrate placement of 11 gauge Murphy needle COMPLICATIONS: None IMPRESSION: Status post CT-guided bone marrow biopsy, with tissue specimen sent to pathology for complete histopathologic analysis Signed, Dulcy Fanny. Earleen Newport, DO [facility] Radiology Electronically Signed   By: Corrie Mckusick D.O.   On: 05/12/2015 13:12   Dg Chest Port 1 View  05/13/2015  CLINICAL DATA:  59 year old male with sepsis. Shortness of breath and weakness. Initial encounter. EXAM: PORTABLE CHEST 1 VIEW COMPARISON:  Portable AP semi upright view at 1432 hours. 08/06/2014 and earlier. FINDINGS: Portable AP semi upright view at 1432 hours. Stable cardiomegaly and mediastinal contours. Stable lung volumes. Allowing for portable technique, the lungs are clear. No pneumothorax or pleural effusion. IMPRESSION: No acute cardiopulmonary abnormality. Electronically Signed   By: Genevie Ann M.D.   On: 05/13/2015 14:53    ASSESSMENT / PLAN:  Attending Note:  59 year old male with history of drug abuse who presents with diffuse anasarca, raised gray rash across the chest, severe eosinophilia, tachypnea and tachycardia. The patient was transferred to the ICU for tachypnea, tachycardia and fever of unknown origin.  Lungs clear to auscultation on exam. Rash over the chest papular and gray in color noted. I reviewed CXR myself and was clear. Discussed with PCCM-NP and ID.  Fever/tachycardia/tachypnea: likely early sepsis. - Pan cultures. - Abx as ordered. - ID following. - Hydrate.  Eosinophilia: concern for lymphoma or leukemia. - Bone marrow biopsy. - F/U CBC. - Check IgE level. - Check stool for parasite and ova.  Diffuse anasarca: history of etoh, ?liver disease and low albumin. - Check albumin level. - F/U LFTs. - Check U/A for proteinuria.  Hypoxemia: - Titrate O2 for sat of 88-92%.  Metabolic acidosis due to renal failure  - Place HD catheter.  - Begin bicarb drip.  Acute renal failure on chronic:  - Renal consult called.  - Will place  trialysis catheter.  Check IgE, if elevated likely infectious vs allergic, if negative would point more towards and hematologic reaction. Further plan as above.  The patient is critically ill with multiple organ systems failure and requires high complexity decision making for assessment and support, frequent evaluation and titration of therapies, application of advanced monitoring technologies and extensive interpretation of multiple databases.   Critical Care Time devoted to patient care services described in this note is  35  Minutes. This time reflects time of care of this signee Dr Jennet Maduro. This critical care time does not reflect procedure time, or teaching time or supervisory time of PA/NP/Med student/Med Resident etc but could involve care discussion time.  Patient seen and examined, agree with above note. I dictated the care and orders written for this patient under my direction.  Rush Farmer, MD [phone]

## 2015-05-13 NOTE — Progress Notes (Signed)
PT Cancellation Note  Patient Details Name: [name] MRN: [id number] DOB: Nov 06, 1955   Cancelled Treatment:    Reason Eval/Treat Not Completed: Medical issues which prohibited therapy (moved to SDU. check back tomorrow.)   Claretha Cooper 05/13/2015, 2:56 PM Tresa Endo PT [phone]

## 2015-05-13 NOTE — Consult Note (Signed)
Reason for Consult: Acute renal failure on chronic kidney disease stage III Referring Physician: Jennet Maduro M.D. (CCM)   HPI: 59 year old African-American man with past medical history significant for baseline chronic kidney disease stage III (appears creatinine ranges from 1.4-1.8), hypertension, CHF (EF 20-25 percent), ischemic dilated cardiomyopathy, history of poor compliance in the past as well as past history of polysubstance abuse who is homeless and currently resides at a hotel. Presents with a convoluted recent past medical history of being seen in the emergency room on 10/13 for nausea, vomiting, sore throat with diarrhea and a rash that was treated as an allergic reaction. Then, admitted a few days later (on 10/16) for complaints of neck swelling/facial swelling with cough productive of frothy yellow sputum together with sore throat and hives. Since his admission, workup has essentially been negative for  etiology of his sepsis, eosinophilia and elevated LFTs and earlier today he underwent bone marrow biopsy.  He was noted to be having progressively rising creatinine which this afternoon was accompanied by metabolic acidosis (bicarbonate 13) and hyperkalemia (6.2). He was having problems with intravenous access and CCM conferred with me and the decision was made to place a trialysis catheter in case he should need dialysis.  Episodes of intermittent hypotension noted and his urinalysis shows hyaline casts with clearing of hematuria that was previously present-positive nitrite and moderate leukocytes with 3-6 WBCs per HPF . Cultures are pending. He has not had intravenous contrast exposure and denies any preceding NSAIDs. Unfortunately, he is not very conversant with his renal history but denies kidney stones/frequent UTIs. He does inform me that he was seen by urology for hematuria and underwent cystoscopy.    04/13/2015  04/30/2015  05/03/2015  05/06/2015  05/09/2015  05/12/2015  05/13/2015    Potassium 4.0 4.2 4.8 4.3 4.9 5.7 (H) 6.1 (HH)  CO2 '25 22 18 ' (L) 17 (L) 20 (L) 18 (L) 13 (L)  Creatinine 1.75 (H) 1.94 (H) 2.26 (H) 2.10 (H) 2.47 (H) 3.04 (H) 3.78 (H)    Past Medical History  Diagnosis Date  . Hypertension   . Homelessness   . Urinary hesitancy   . Hypercholesterolemia   . NSTEMI (non-ST elevated myocardial infarction) ([place]) 01/07/2015  . Heart attack ([place]) 07/2014  . Walking pneumonia 07/2014  . Headache     "probably weekly" (01/07/2015)  . Arthritis     "all over" (01/07/2015)  . Anxiety   . Depression   . Noncompliance   . Polysubstance abuse     etoh, cocaine  . Ischemic dilated cardiomyopathy   . CKD (chronic kidney disease), stage IV ([place])   . History of echocardiogram 12/2014    EF 20-25%    Past Surgical History  Procedure Laterality Date  . Left heart catheterization with coronary angiogram N/A 08/15/2014    Procedure: LEFT HEART CATHETERIZATION WITH CORONARY ANGIOGRAM;  Surgeon: Clent Demark, MD;  Location: [facility] CATH LAB;  Service: Cardiovascular;  Laterality: N/A;  . Cardiac catheterization      History reviewed. No pertinent family history.  Social History:  reports that he quit smoking about 11 years ago. His smoking use included Cigarettes. He has a 45 pack-year smoking history. He has never used smokeless tobacco. He reports that he drinks alcohol. He reports that he does not use illicit drugs.  Allergies: No Known Allergies  Medications:  Scheduled: . antiseptic oral rinse  7 mL Mouth Rinse BID  . aspirin EC  81 mg Oral Daily  . clopidogrel  75 mg Oral Daily  . dextrose      . diphenhydrAMINE  25 mg Oral Q6H  . famotidine  20 mg Oral Daily  . heparin  1,000-5,000 Units Intracatheter Once  . magic mouthwash  5 mL Oral QID  . mirabegron ER  25 mg Oral Daily  . piperacillin-tazobactam  3.375 g Intravenous Once  . piperacillin-tazobactam (ZOSYN)  IV  3.375 g Intravenous 3 times per day  . sodium chloride  3 mL Intravenous Q12H  .  vancomycin  1,000 mg Intravenous Once  . [START ON 05/15/2015] vancomycin  1,000 mg Intravenous Q48H    BMP Latest Ref Rng 05/13/2015 05/13/2015 05/13/2015  Glucose 65 - 99 mg/dL 41(LL) 115(H) 99  BUN 6 - 20 mg/dL 55(H) 53(H) 52(H)  Creatinine 0.61 - 1.24 mg/dL 3.78(H) 3.56(H) 3.15(H)  Sodium 135 - 145 mmol/L 138 137 138  Potassium 3.5 - 5.1 mmol/L 6.1(HH) 5.0 5.6(H)  Chloride 101 - 111 mmol/L 107 109 111  CO2 22 - 32 mmol/L 13(L) 18(L) 19(L)  Calcium 8.9 - 10.3 mg/dL 8.4(L) 8.5(L) 8.4(L)   CBC Latest Ref Rng 05/13/2015 05/13/2015 05/12/2015  WBC 4.0 - 10.5 K/uL 13.0(H) 8.3 15.9(H)  Hemoglobin 13.0 - 17.0 g/dL 11.4(L) 9.8(L) 9.9(L)  Hematocrit 39.0 - 52.0 % 31.3(L) 27.7(L) 27.1(L)  Platelets 150 - 400 K/uL 246 195 217     Ct Biopsy  05/12/2015  CLINICAL DATA:  59 year old male of uncertain hematologic diagnosis. He has been referred for bone marrow biopsy. EXAM: CT-GUIDED BONE MARROW BIOPSY MEDICATIONS AND MEDICAL HISTORY: Versed 1.5 mg, Fentanyl 50 mcg. Additional Medications: None. ANESTHESIA/SEDATION: Moderate sedation time: 22 minutes PROCEDURE: The procedure risks, benefits, and alternatives were explained to the patient. Questions regarding the procedure were encouraged and answered. The patient understands and consents to the procedure. Scout CT of the pelvis was performed for surgical planning purposes. The posterior pelvis was prepped with Betadinein a sterile fashion, and a sterile drape was applied covering the operative field. A sterile gown and sterile gloves were used for the procedure. Local anesthesia was provided with 1% Lidocaine. We targeted the right posterior iliac bone for biopsy. The skin and subcutaneous tissues were infiltrated with 1% lidocaine without epinephrine. A small stab incision was made with an 11 blade scalpel, and an 11 gauge Murphy needle was advanced with CT guidance to the posterior cortex. Manual forced was used to advance the needle through the  posterior cortex and the stylet was removed. A bone marrow aspirate was retrieved and passed to a cytotechnologist in the room. The Murphy needle was then advanced without the stylet for a core biopsy. Multiple core biopsy were attempted to be retrieved, with a paucity of marrow retrieved. Core biopsy was sent for pathology and for culture. Manual pressure was used for hemostasis and a sterile dressing was placed. No complications were encountered no significant blood loss was encountered. Patient tolerated the procedure well and remained hemodynamically stable throughout. FINDINGS: Scout image demonstrates safe approach to posterior iliac bone. Images during the case demonstrate placement of 11 gauge Murphy needle COMPLICATIONS: None IMPRESSION: Status post CT-guided bone marrow biopsy, with tissue specimen sent to pathology for complete histopathologic analysis Signed, Dulcy Fanny. Earleen Newport, DO [facility] Radiology Electronically Signed   By: Corrie Mckusick D.O.   On: 05/12/2015 13:12   Dg Chest Port 1 View  05/13/2015  CLINICAL DATA:  Acute renal failure and congestive heart failure. Central line placement. EXAM: PORTABLE CHEST 1 VIEW COMPARISON:  05/13/2015 FINDINGS: A new right jugular central venous catheter is seen with tip in the mid SVC. No evidence of pneumothorax. Mild cardiomegaly again noted.  Both lungs are clear. IMPRESSION: New right jugular central venous catheter in appropriate position. No evidence of pneumothorax or other acute findings. Electronically Signed   By: Earle Gell M.D.   On: 05/13/2015 18:17   Dg Chest Port 1 View  05/13/2015  CLINICAL DATA:  59 year old male with sepsis. Shortness of breath and weakness. Initial encounter. EXAM: PORTABLE CHEST 1 VIEW COMPARISON:  Portable AP semi upright view at 1432 hours. 08/06/2014 and earlier. FINDINGS: Portable AP semi upright view at 1432 hours. Stable cardiomegaly and mediastinal contours.  Stable lung volumes. Allowing for portable technique, the lungs are clear. No pneumothorax or pleural effusion. IMPRESSION: No acute cardiopulmonary abnormality. Electronically Signed   By: Genevie Ann M.D.   On: 05/13/2015 14:53   Dg Abd Portable 2v  05/13/2015  CLINICAL DATA:  Vomiting and generalized abdominal pain. EXAM: PORTABLE ABDOMEN - 2 VIEW COMPARISON:  CT 05/08/2015 FINDINGS: Negative for free air on the left lateral decubitus image. Gaseous distention of stomach with an air-fluid level. There is evidence for gas and stool in the colon. Nonobstructive bowel gas pattern. IMPRESSION: Gaseous distension of the stomach, otherwise, unremarkable abdominal examination. Electronically Signed   By: Markus Daft M.D.   On: 05/13/2015 16:42    Review of Systems  Constitutional: Positive for chills and malaise/fatigue.  HENT: Positive for sore throat. Negative for hearing loss and nosebleeds.   Eyes: Negative.   Respiratory: Positive for cough, sputum production and shortness of breath. Negative for wheezing.   Cardiovascular: Negative for chest pain, orthopnea and leg swelling.  Gastrointestinal: Positive for heartburn, nausea and abdominal pain. Negative for diarrhea and constipation.  Genitourinary: Positive for urgency. Negative for dysuria and hematuria.  Musculoskeletal: Positive for myalgias and back pain.  Skin: Positive for rash.  Neurological: Positive for weakness. Negative for dizziness, tingling, focal weakness and headaches.  Psychiatric/Behavioral: The patient is nervous/anxious.    Blood pressure 127/101, pulse 37, temperature 98.3 F (36.8 C), temperature source Oral, resp. rate 39, height '5\' 8"'  (1.727 m), weight 82.2 kg (181 lb 3.5 oz), SpO2 93 %. Physical Exam  Nursing note and vitals reviewed. Constitutional: He is oriented to person, place, and time. He appears well-developed and well-nourished. No distress.  HENT:  Head: Normocephalic and atraumatic.  Nose: Nose normal.   Dry oropharynx  Eyes: Conjunctivae and EOM are normal. Pupils are equal, round, and reactive to light. No scleral icterus.  Neck: Normal range of motion. Neck supple. No JVD present.  Cardiovascular: Regular rhythm.   Regular tachycardia  Respiratory: Breath sounds normal. He has no wheezes. He has no rales.  Tachypneic  GI: Soft. There is tenderness. There is guarding. There is no rebound.  Epigastric and RUQ tenderness  Musculoskeletal: He exhibits no edema or tenderness.  Lymphadenopathy:    He has no cervical adenopathy.  Neurological: He is alert and oriented to person, place, and time.  Skin: Skin is warm and dry. Rash noted.  Exfoliative rash noted around hands    Assessment/Plan: 1. Acute renal failure on chronic kidney disease stage III: Charting so far is anuric versus oliguric although patient insists that he has put out a lot of urine throughout the day but just not collected. Baseline chronic kidney disease at this time appears to be primarily from hypertensive nephrosclerosis versus ischemic nephropathy. No evidence of ANCA vasculitis or  lupus from serologies that have been done so far. No evidence of chronic hepatitis or HIV from testing done so far. With intermittent episodes of hypotension, I suspect that he has ischemic ATN compounding his renal injury although cannot entirely rule out acute interstitial nephritis with the combination of recent fever, rash and eosinophilia (what weighs against this at this time is the paucity of leukocytes in the urine). It'll be interesting to see what his bone marrow biopsy yields. At this time, supportive management will be undertaken with medical management of his hyperkalemia (Kayexalate has already been given) as well as metabolic acidosis management with isotonic sodium bicarbonate. Continue to avoid intravenous contrast as you currently doing. I will repeat a urinalysis, send for urine electrolytes and order renal ultrasound. He does not  have any acute hemodialysis needs/CRRT needs at this time. 2. Hyperkalemia: Secondary to acute renal failure/poor urine output, treated with Kayexalate and now on isotonic sodium bicarbonate. 3. Metabolic acidosis: Appears to be associated with sepsis as evidenced by his elevated lactic acid level and pro-calcitonin. Attempt medical management with isotonic sodium bicarbonate.  4. Sepsis of unclear etiology/source: Serological testing and cultures so far are negative, status post bone marrow biopsy today. On empiric antibiotic therapy with intravenous vancomycin and Zosyn. Recommend closely following vancomycin levels to minimize toxicity. 5. Eosinophilia: Serological testing done so far is unyielding although several differentials noted from ID notes.  Carolann Brazell K. 05/13/2015, 6:24 PM

## 2015-05-13 NOTE — Progress Notes (Addendum)
Patient ID: [name], male   DOB: [birth year], 59 y.o.   MRN: [id number]  [facility] PROGRESS NOTE  [name] [id number]:[id number] DOB: Jul 30, 1955 DOA: 05/03/2015 PCP: Charolette Forward, MD   Brief narrative:    59 y.o. male with hypertension, chronic combined CHF, CAD, NSTEMI (on aspirin and plavix), CKD stage 3 who was admitted 05/03/15 with chief complaint of gradually worsening neck swelling. In ED, pt was hemodynamically stable. BP was 91/61, HR 92. Blood work revealed WBC count of 16.8, creatinine 2.26 (baseline 8 months ago 1.9). CT renal stone study showed no hydronephrosis or urolithiasis. LFT's were as follows: AST 299, ALT 283, TB 3.9. His recent abd [place] 04/30/15 showed mild gallbladder wall thickening probably attributable to nondistention/contraction. No gallstones. Other imaging studies included, CXR which showed no acute findings. X ray of the neck showed no acute findings. CT neck showed asymmetric enlargement of the right submandibular gland relative to the left, nonspecific but could be due to infection, inflammation or possibly tumor. ID was consulted due to diagnostic uncertainty, and an autoimmune workup has been initiated. The patient will also undergo bone marrow biopsy.  Assessment/Plan:    Principal Problem:  Sepsis due to cellulitis [facility]) with sepsis associated hypotension, neck swelling and leukocytosis - Sepsis criteria met 05/04/15 with hypotension, fever, tachycardia, leukocytosis and lactic acidosis. - Source of infection felt to be from neck cellulitis. Blood cultures negative to date. - Pt was given clindamycin on admission but this was changed to broad spectrum abx, vanco and zosyn for sepsis. - CT neck on admission demonstrated asymmetric enlargement of the right submandibular gland relative to the left. - MRI of the neck 05/04/15 consistent with cellulitis. No evidence of salivary gland mass or other abnormalities. - Antibiotics discontinued  05/08/15 after ID evaluation. HIV, CMV, EBV all negative.  - bone marrow biopsy now with preliminary report g-rods, pt with persistent fever, will initiate sepsis protocol  - will discuss with ID of this change  - will also transfer to SDU, PCCM consulted   Active Problems:  Abnormal urinalysis - urine culture with no growth in 24 hours  - WBC is trending down  - has not been on ABX per ID but will change, sepsis order set in place    R/O autoimmune disease - F/U ANA, SPEP, anti-DS ab, RF, anti CCP, ACE, SSA/SSAB, ANCA's, Angiotensin Converting enzyme level. - ESR WNL at 10, CRP elevated at 9.7, LDH elevated at 582. Urinalysis with 100 mg/dL of protein. - Bone marrow biopsy done 10/25, sent for pathology for AFB, fungal and bacterial cultures. - preliminary report with g- rods  - started ABX per sepsis protocol    Acute renal failure superimposed on stage 3 chronic kidney disease ([place]) - with hyperkalemia and metabolic acidosis, ? From contrast pt has received with CT neck  - Baseline creatinine about 8 months ago 1.9. Currently 3.5 and continues trending up - Continue to hold Lasix and lisinopril. - CT renal stone protocol showed no hydronephrosis or urolithiasis.  - gave dose of kayexalate  - continue with IVF, repeat BMP in AM   Chronic combined systolic (congestive) and diastolic (congestive) heart failure (HCC) - Last 2 D ECHO in 12/2014 with EF of 20%. - Repeat chest x-ray 05/08/15: Clear. Sees Harwani. - repeat CXR today    Dysphagia - Evaluated by speech therapy 05/04/15: Dysphagia 3 diet recommended. Oral care TID. MMW ordered. - Chest x-ray clear on admission and again on 05/08/15.   Benign essential HTN -  All blood pressure medicines currently on hold.   History of non-ST elevation myocardial infarction (NSTEMI) - Stable, no reports of chest pain.  - Sinus rhythm on 12 lead EKG. - Continue aspirin and plavix. Statin on hold secondary to elevated  LFTs.   Hyponatremia - Likely due to CHF etiology. Sodium stable at 137   Abnormal LFTs - Recent US done 10/13 showed no acute findings, no gallstones seen. - LFTs worse today. HIDA scan & acute hepatitis serologies negative.  - Holding Lipitor. - PT/INR reassuring, platelet count OK. - Evaluated by GI 05/08/15, no further GI evaluation recommended.   Polysubstance abuse / Noncompliance / Homelessness - SW consulted. Counseled regarding abstinence. - UDS and alcohol level WNL   DVT Prophylaxis  - SCD's bilaterally, aspirin, plavix.   Code Status: Full.  Family Communication: No family at the bedside. Disposition Plan: Transfer to SDU  IV access:  Peripheral IV  Procedures and diagnostic studies:    Ct Abdomen Pelvis Wo Contrast 05/08/2015 Diffuse body wall edema suggesting anasarca. 2. Mesenteric edema but no overt ascites. 3. No acute intra-abdominal/abnormalities. 4. Stable prostate gland enlargement and small hiatal hernia.   Dg Neck Soft Tissue 05/03/2015 No acute findings.   Ct Soft Tissue Neck Wo Contrast 05/03/2015   Asymmetric enlargement of the right submandibular gland relative to the left is nonspecific but could be due to infection, inflammation or possibly tumor. MRI of the neck with contrast if possible after the patient's acute episode has passed is recommended for further evaluation. Negative for abscess. Diffuse subcutaneous edema is nonspecific and likely due to anasarca rather than cellulitis.   Nm Hepatobiliary Including Gb 05/07/2015  Normal filling of the gallbladder with radiotracer, indicating cystic duct patency, which is not consistent with acute cholecystitis. 2. Prompt radiotracer excretion into the common bile duct and small bowel, with no evidence of biliary obstruction. 3. Normal liver radiotracer uptake and excretion.   Mr Neck Soft Tissue Only W Wo Contrast 05/04/2015  Motion degraded study with no discrete salivary gland mass or  other abnormality identified. 2. Grossly similar subcutaneous edema involving primarily the bilateral submandibular spaces. Again, while this finding is nonspecific, possible infection could have this appearance given the history of leukocytosis and concern for cellulitis. This is better evaluated on prior CT. No discrete abscess.  Ct Biopsy 05/12/2015  Status post CT-guided bone marrow biopsy, with tissue specimen sent to pathology for complete histopathologic analysis  Dg Chest Port 1 View 05/08/2015 No acute cardiopulmonary findings.  Ct Renal Stone Study 05/03/2015   No evidence of urolithiasis, hydronephrosis, or other acute findings. Stable moderately enlarged prostate.   US Abdomen Limited Ruq 04/30/2015 The mild gallbladder wall thickening is probably attributable 2 nondistention/contraction. No gallstones are observed. Sonographic Murphy's sign is technically indeterminate due to pain medication.   Medical Consultants:  IR ID PCCM GI  Other Consultants:  None  IAnti-Infectives:   vanc and zosyn 10/26 -->  Faye Ramsay, MD  [facility] Pager [phone]  If 7PM-7AM, please contact night-coverage [email or web address] Password [email or web address] 05/13/2015, 1:28 PM   LOS: 9 days   HPI/Subjective: No events overnight. More somnolent this afternoon.   Objective: Filed Vitals:   05/12/15 1356 05/12/15 2032 05/12/15 2141 05/13/15 0449  BP: 115/78 1'17/67 93/52 99/66 '  Pulse: 82 108 97 64  Temp: 98.3 F (36.8 C) 100.9 F (38.3 C) 98 F (36.7 C) 97.4 F (36.3 C)  TempSrc: Oral Oral Oral Oral  Resp: '20 24 20 20  ' Height:  Weight:    80.7 kg (177 lb 14.6 oz)  SpO2: 99% 100% 100% 100%    Intake/Output Summary (Last 24 hours) at 05/13/15 1328 Last data filed at 05/12/15 1700  Gross per 24 hour  Intake    240 ml  Output      0 ml  Net    240 ml    Exam:   General:  Pt is alert, follows commands appropriately, not in acute distress  Cardiovascular: Regular rate and rhythm,  S1/S2, no murmurs, no rubs, no gallops  Respiratory: Clear to auscultation bilaterally, diminished breath sounds at bases   Abdomen: Soft, non tender, non distended, bowel sounds present, no guarding  Data Reviewed: Basic Metabolic Panel:  Recent Labs Lab 05/10/15 0540 05/12/15 0516 05/12/15 1000 05/13/15 0535 05/13/15 1130  NA 136 137 138 138 137  K 4.8 5.7* 5.7* 5.6* 5.0  CL 106 111 112* 111 109  CO2 21* 18* 19* 19* 18*  GLUCOSE 84 89 76 99 115*  BUN 38* 48* 49* 52* 53*  CREATININE 2.77* 3.04* 3.02* 3.15* 3.56*  CALCIUM 8.5* 8.3* 8.5* 8.4* 8.5*   Liver Function Tests:  Recent Labs Lab 05/07/15 0457 05/08/15 0520 05/09/15 0559 05/12/15 0516  AST 561* 517* 599* 702*  ALT 461* 460* 510* 518*  ALKPHOS 197* 237* 241* 252*  BILITOT 3.2* 3.3* 3.9* 5.2*  PROT 4.5* 4.9* 5.3* 5.9*  ALBUMIN 2.4* 2.5* 2.6* 2.5*   CBC:  Recent Labs Lab 05/08/15 0520 05/09/15 0559 05/10/15 0540 05/12/15 0516 05/13/15 0535  WBC 23.8* 26.8* 23.5* 15.9* 8.3  NEUTROABS  --   --   --  5.9  --   HGB 10.8* 10.9* 10.6* 9.9* 9.8*  HCT 31.3* 31.2* 30.7* 27.1* 27.7*  MCV 101.0* 103.7* 103.7* 106.3* 107.8*  PLT 213 229 221 217 195   CBG:  Recent Labs Lab 05/10/15 0729 05/10/15 0828 05/11/15 0806 05/12/15 0602 05/13/15 0727  GLUCAP 47* 92 67 77 91    Recent Results (from the past 240 hour(s))  Culture, blood (routine x 2)     Status: None   Collection Time: 05/04/15  2:50 AM  Result Value Ref Range Status   Specimen Description BLOOD RIGHT ARM  Final   Special Requests BOTTLES DRAWN AEROBIC AND ANAEROBIC [place]  Final   Culture   Final    NO GROWTH 5 DAYS Performed at [facility]    Report Status 05/09/2015 FINAL  Final  Culture, blood (routine x 2)     Status: None   Collection Time: 05/04/15  2:55 AM  Result Value Ref Range Status   Specimen Description BLOOD RIGHT HAND  Final   Special Requests BOTTLES DRAWN AEROBIC ONLY 10CC  Final   Culture   Final    NO GROWTH 5  DAYS Performed at [facility]    Report Status 05/09/2015 FINAL  Final  Culture, Urine     Status: None   Collection Time: 05/04/15 10:57 PM  Result Value Ref Range Status   Specimen Description URINE, RANDOM  Final   Special Requests NONE  Final   Culture   Final    NO GROWTH 1 DAY Performed at [facility]    Report Status 05/06/2015 FINAL  Final  Culture, Urine     Status: None   Collection Time: 05/11/15  4:21 PM  Result Value Ref Range Status   Specimen Description URINE, CLEAN CATCH  Final   Special Requests Normal  Final  Culture   Final    NO GROWTH 2 DAYS Performed at [facility]    Report Status 05/13/2015 FINAL  Final  Tissue culture     Status: None (Preliminary result)   Collection Time: 05/12/15  9:50 AM  Result Value Ref Range Status   Specimen Description BIOPSY BONE MARROW  Final   Special Requests NONE  Final   Gram Stain   Final    MODERATE WBC PRESENT,BOTH PMN AND MONONUCLEAR NO SQUAMOUS EPITHELIAL CELLS SEEN NO ORGANISMS SEEN Performed at [facility]    Culture   Final    FEW ESCHERICHIA COLI Performed at [facility]    Report Status PENDING  Incomplete  Fungus culture w smear     Status: None (Preliminary result)   Collection Time: 05/12/15  9:50 AM  Result Value Ref Range Status   Specimen Description BIOPSY BONE MARROW  Final   Special Requests NONE  Final   Fungal Smear   Final    NO YEAST OR FUNGAL ELEMENTS SEEN Performed at [facility]    Culture   Final    CULTURE IN PROGRESS FOR FOUR WEEKS Performed at [facility]    Report Status PENDING  Incomplete     Scheduled Meds: . aspirin EC  81 mg Oral Daily  . clopidogrel  75 mg Oral Daily  . diphenhydrAMINE  25 mg Oral Q6H  . famotidine  20 mg Oral Daily  . magic mouthwash  5 mL Oral QID  . mirabegron ER  25 mg Oral Daily  . sodium chloride  3 mL Intravenous Q12H   Continuous Infusions: . sodium chloride

## 2015-05-13 NOTE — Progress Notes (Signed)
ANTIBIOTIC CONSULT NOTE - INITIAL  Pharmacy Consult for Vancomycin / Zosyn Indication: Sepsis  No Known Allergies  Patient Measurements: Height: _0  (172.7 cm) Weight: 177 lb 14.6 oz (80.7 kg) IBW/kg (Calculated) : 68.4 Adjusted Body Weight:   Vital Signs: Temp: 101 F (38.3 C) (10/26 1423) Temp Source: Rectal (10/26 1423) BP: 132/96 mmHg (10/26 1413) Pulse Rate: 64 (10/26 0449) Intake/Output from previous day: 10/25 0701 - 10/26 0700 In: 840 [P.O.:840] Out: -  Intake/Output from this shift:    Labs:  Recent Labs  05/12/15 0516 05/12/15 1000 05/13/15 0535 05/13/15 1130  WBC 15.9*  --  8.3  --   HGB 9.9*  --  9.8*  --   PLT 217  --  195  --   CREATININE 3.04* 3.02* 3.15* 3.56*   Estimated Creatinine Clearance: 21.1 mL/min (by C-G formula based on Cr of 3.56). No results for input(s): VANCOTROUGH, VANCOPEAK, VANCORANDOM, GENTTROUGH, GENTPEAK, GENTRANDOM, TOBRATROUGH, TOBRAPEAK, TOBRARND, AMIKACINPEAK, AMIKACINTROU, AMIKACIN in the last 72 hours.   Microbiology: Recent Results (from the past 720 hour(s))  Culture, blood (routine x 2)     Status: None   Collection Time: 05/04/15  2:50 AM  Result Value Ref Range Status   Specimen Description BLOOD RIGHT ARM  Final   Special Requests BOTTLES DRAWN AEROBIC AND ANAEROBIC [place]  Final   Culture   Final    NO GROWTH 5 DAYS Performed at [facility]    Report Status 05/09/2015 FINAL  Final  Culture, blood (routine x 2)     Status: None   Collection Time: 05/04/15  2:55 AM  Result Value Ref Range Status   Specimen Description BLOOD RIGHT HAND  Final   Special Requests BOTTLES DRAWN AEROBIC ONLY 10CC  Final   Culture   Final    NO GROWTH 5 DAYS Performed at [facility]    Report Status 05/09/2015 FINAL  Final  Culture, Urine     Status: None   Collection Time: 05/04/15 10:57 PM  Result Value Ref Range Status   Specimen Description URINE, RANDOM  Final   Special Requests NONE  Final   Culture    Final    NO GROWTH 1 DAY Performed at [facility]    Report Status 05/06/2015 FINAL  Final  Culture, Urine     Status: None   Collection Time: 05/11/15  4:21 PM  Result Value Ref Range Status   Specimen Description URINE, CLEAN CATCH  Final   Special Requests Normal  Final   Culture   Final    NO GROWTH 2 DAYS Performed at [facility]    Report Status 05/13/2015 FINAL  Final  Tissue culture     Status: None (Preliminary result)   Collection Time: 05/12/15  9:50 AM  Result Value Ref Range Status   Specimen Description BIOPSY BONE MARROW  Final   Special Requests NONE  Final   Gram Stain   Final    MODERATE WBC PRESENT,BOTH PMN AND MONONUCLEAR NO SQUAMOUS EPITHELIAL CELLS SEEN NO ORGANISMS SEEN Performed at [facility]    Culture   Final    FEW ESCHERICHIA COLI Performed at [facility]    Report Status PENDING  Incomplete  Fungus culture w smear     Status: None (Preliminary result)   Collection Time: 05/12/15  9:50 AM  Result Value Ref Range Status   Specimen Description BIOPSY BONE MARROW  Final   Special Requests NONE  Final  Fungal Smear   Final    NO YEAST OR FUNGAL ELEMENTS SEEN Performed at [facility]    Culture   Final    CULTURE IN PROGRESS FOR FOUR WEEKS Performed at [facility]    Report Status PENDING  Incomplete    Medical History: Past Medical History  Diagnosis Date  . Hypertension   . Homelessness   . Urinary hesitancy   . Hypercholesterolemia   . NSTEMI (non-ST elevated myocardial infarction) ([place]) 01/07/2015  . Heart attack ([place]) 07/2014  . Walking pneumonia 07/2014  . Headache     "probably weekly" (01/07/2015)  . Arthritis     "all over" (01/07/2015)  . Anxiety   . Depression   . Noncompliance   . Polysubstance abuse     etoh, cocaine  . Ischemic dilated cardiomyopathy   . CKD (chronic kidney disease), stage IV ([place])   . History of echocardiogram 12/2014    EF 20-25%    Assessment: 66 yoM with hx homelessness, polysubstance abuse with crack cocaine, CHF, CAD, and HTN admitted with neck pain and swelling.  Pt initially treated with broad spectrum antibiotics for concern for sepsis however antibiotics stopped on 10/22 per ID recommendations as no source identified.  CT and MRI neck neg, CT a/p neg, EBC, CMV IgM's neg.  Bone marrow biopsy performed 10/25 growing E.Coli and now pt with fever, acute onset SOB, tachycardia. Pharmacy consulted to resume vancomycin and zosyn for sepsis.    Anti-infectives 10/16 >> Clinda >> 10/16 10/17 >>Vanc >> 10/21, 10/26 >> 10/17 >> Zosyn >> 10/21, 10/26 >>  Vitals/Labs WBC: 8.3 Tm24h: 101 AoCKD: SCr continues rising, now 3.56, CrCl ~21  Cultures 10/17 bloodx2: NGF 10/17 urine: NGF 10/24 urine: NGF  10/25 BM biopsy: Few E.Coli  Trough Levels: 10/20 04:57 VT < 4 mcg/ml on 762m IV q24h, SCr ~2.1  Goal of Therapy:  Vancomycin trough level 15-20 mcg/ml  Eradication of infection  Plan:  Vancomycin 1g IV q48h Zosyn 3.375g IV q8h (infuse over 4 hours) Watch renal function closely and adjust doses as needed F/u cultures, ID recs  CRalene Bathe PharmD, BCPS 05/13/2015, 3:03 PM  Pager: [phone]

## 2015-05-13 NOTE — Progress Notes (Signed)
CRITICAL VALUE ALERT  Critical value received:  Lactic Acid 10.4  Date of notification:  05/13/15  Time of notification:  19:50  Critical value read back:Yes.    Nurse who received alert:  Lenox Ahr  MD notified (1st page):  ELINK  Time of first page:  19:55  MD notified (2nd page): N/A  Time of second page: N/A  Responding MD: Dr Halford Chessman   Time MD responded:  19:55

## 2015-05-13 NOTE — Progress Notes (Addendum)
         Raised pale grey rash on neck and back.  Skin sloughing on all four extremities.     Noe Gens, NP-C [facility] Pgr: [phone] or if no answer [phone] 05/13/2015, 8:27 PM

## 2015-05-14 ENCOUNTER — Inpatient Hospital Stay (HOSPITAL_COMMUNITY): Payer: Medicaid Other

## 2015-05-14 DIAGNOSIS — A4150 Gram-negative sepsis, unspecified: Secondary | ICD-10-CM

## 2015-05-14 DIAGNOSIS — D759 Disease of blood and blood-forming organs, unspecified: Secondary | ICD-10-CM

## 2015-05-14 DIAGNOSIS — B9620 Unspecified Escherichia coli [E. coli] as the cause of diseases classified elsewhere: Secondary | ICD-10-CM

## 2015-05-14 DIAGNOSIS — Z9911 Dependence on respirator [ventilator] status: Secondary | ICD-10-CM

## 2015-05-14 DIAGNOSIS — R6520 Severe sepsis without septic shock: Secondary | ICD-10-CM

## 2015-05-14 LAB — RENAL FUNCTION PANEL
ALBUMIN: 2.4 g/dL — AB (ref 3.5–5.0)
ANION GAP: 14 (ref 5–15)
ANION GAP: 10 (ref 5–15)
Albumin: 2.8 g/dL — ABNORMAL LOW (ref 3.5–5.0)
Albumin: 2.6 g/dL — ABNORMAL LOW (ref 3.5–5.0)
Anion gap: 8 (ref 5–15)
BUN: 59 mg/dL — ABNORMAL HIGH (ref 6–20)
BUN: 51 mg/dL — ABNORMAL HIGH (ref 6–20)
BUN: 46 mg/dL — AB (ref 6–20)
CALCIUM: 7.6 mg/dL — AB (ref 8.9–10.3)
CHLORIDE: 104 mmol/L (ref 101–111)
CHLORIDE: 102 mmol/L (ref 101–111)
CHLORIDE: 100 mmol/L — AB (ref 101–111)
CO2: 17 mmol/L — AB (ref 22–32)
CO2: 27 mmol/L (ref 22–32)
CO2: 30 mmol/L (ref 22–32)
Calcium: 7.8 mg/dL — ABNORMAL LOW (ref 8.9–10.3)
Calcium: 7.1 mg/dL — ABNORMAL LOW (ref 8.9–10.3)
Creatinine, Ser: 4.13 mg/dL — ABNORMAL HIGH (ref 0.61–1.24)
Creatinine, Ser: 3.38 mg/dL — ABNORMAL HIGH (ref 0.61–1.24)
Creatinine, Ser: 3.12 mg/dL — ABNORMAL HIGH (ref 0.61–1.24)
GFR calc Af Amer: 23 mL/min — ABNORMAL LOW (ref >60)
GFR calc non Af Amer: 14 mL/min — ABNORMAL LOW (ref >60)
GFR calc non Af Amer: 18 mL/min — ABNORMAL LOW (ref >60)
GFR calc non Af Amer: 20 mL/min — ABNORMAL LOW (ref >60)
GFR, EST AFRICAN AMERICAN: 17 mL/min — AB (ref >60)
GFR, EST AFRICAN AMERICAN: 21 mL/min — AB (ref >60)
GLUCOSE: 89 mg/dL (ref 65–99)
Glucose, Bld: 89 mg/dL (ref 65–99)
Glucose, Bld: 81 mg/dL (ref 65–99)
PHOSPHORUS: 6.5 mg/dL — AB (ref 2.5–4.6)
POTASSIUM: 4.4 mmol/L (ref 3.5–5.1)
Phosphorus: 8.3 mg/dL — ABNORMAL HIGH (ref 2.5–4.6)
Phosphorus: 6.5 mg/dL — ABNORMAL HIGH (ref 2.5–4.6)
Potassium: 6.8 mmol/L (ref 3.5–5.1)
Potassium: 4.8 mmol/L (ref 3.5–5.1)
SODIUM: 135 mmol/L (ref 135–145)
Sodium: 139 mmol/L (ref 135–145)
Sodium: 138 mmol/L (ref 135–145)

## 2015-05-14 LAB — TISSUE CULTURE

## 2015-05-14 LAB — GLUCOSE, CAPILLARY
GLUCOSE-CAPILLARY: 94 mg/dL (ref 65–99)
GLUCOSE-CAPILLARY: 104 mg/dL — AB (ref 65–99)
GLUCOSE-CAPILLARY: 72 mg/dL (ref 65–99)
GLUCOSE-CAPILLARY: 61 mg/dL — AB (ref 65–99)
GLUCOSE-CAPILLARY: 61 mg/dL — AB (ref 65–99)
GLUCOSE-CAPILLARY: 78 mg/dL (ref 65–99)
GLUCOSE-CAPILLARY: 63 mg/dL — AB (ref 65–99)
GLUCOSE-CAPILLARY: 55 mg/dL — AB (ref 65–99)
GLUCOSE-CAPILLARY: 84 mg/dL (ref 65–99)
GLUCOSE-CAPILLARY: 59 mg/dL — AB (ref 65–99)
GLUCOSE-CAPILLARY: 89 mg/dL (ref 65–99)
Glucose-Capillary: 66 mg/dL (ref 65–99)
Glucose-Capillary: 66 mg/dL (ref 65–99)
Glucose-Capillary: 65 mg/dL (ref 65–99)
Glucose-Capillary: 86 mg/dL (ref 65–99)
Glucose-Capillary: 90 mg/dL (ref 65–99)

## 2015-05-14 LAB — CBC WITH DIFFERENTIAL/PLATELET
BASOS ABS: 0.1 10*3/uL (ref 0.0–0.1)
Basophils Relative: 1 %
Eosinophils Absolute: 0.1 10*3/uL (ref 0.0–0.7)
Eosinophils Relative: 1 %
HEMATOCRIT: 28.9 % — AB (ref 39.0–52.0)
HEMOGLOBIN: 10.5 g/dL — AB (ref 13.0–17.0)
LYMPHS PCT: 16 %
Lymphs Abs: 1.7 10*3/uL (ref 0.7–4.0)
MCH: 40.4 pg — ABNORMAL HIGH (ref 26.0–34.0)
MCHC: 36.3 g/dL — ABNORMAL HIGH (ref 30.0–36.0)
MCV: 111.2 fL — ABNORMAL HIGH (ref 78.0–100.0)
MONOS PCT: 7 %
Monocytes Absolute: 0.7 10*3/uL (ref 0.1–1.0)
NEUTROS PCT: 75 %
Neutro Abs: 7.8 10*3/uL — ABNORMAL HIGH (ref 1.7–7.7)
Platelets: 188 10*3/uL (ref 150–400)
RBC: 2.6 MIL/uL — AB (ref 4.22–5.81)
RDW: 21 % — ABNORMAL HIGH (ref 11.5–15.5)
WBC: 10.4 10*3/uL (ref 4.0–10.5)

## 2015-05-14 LAB — BLOOD GAS, ARTERIAL
ACID-BASE DEFICIT: 10.3 mmol/L — AB (ref 0.0–2.0)
Bicarbonate: 14.4 mEq/L — ABNORMAL LOW (ref 20.0–24.0)
Drawn by: 308601
FIO2: 0.5
LHR: 14 {breaths}/min
MECHVT: 550 mL
O2 SAT: 99.2 %
PEEP/CPAP: 5 cmH2O
PH ART: 7.312 — AB (ref 7.350–7.450)
PO2 ART: 178 mmHg — AB (ref 80.0–100.0)
Patient temperature: 37.3
TCO2: 13.8 mmol/L (ref 0–100)
pCO2 arterial: 29.5 mmHg — ABNORMAL LOW (ref 35.0–45.0)

## 2015-05-14 LAB — MAGNESIUM: MAGNESIUM: 2.8 mg/dL — AB (ref 1.7–2.4)

## 2015-05-14 LAB — PROCALCITONIN: Procalcitonin: 20.03 ng/mL

## 2015-05-14 LAB — LACTIC ACID, PLASMA
LACTIC ACID, VENOUS: 5.4 mmol/L — AB (ref 0.5–2.0)
Lactic Acid, Venous: 5.6 mmol/L (ref 0.5–2.0)

## 2015-05-14 LAB — IGE: IgE (Immunoglobulin E), Serum: 5125 IU/mL — ABNORMAL HIGH (ref 0–100)

## 2015-05-14 MED ORDER — SODIUM POLYSTYRENE SULFONATE 15 GM/60ML PO SUSP
60.0000 g | Freq: Once | ORAL | Status: AC
  Administered 2015-05-14: 60 g via ORAL
  Filled 2015-05-14: qty 240

## 2015-05-14 MED ORDER — DEXTROSE 50 % IV SOLN
INTRAVENOUS | Status: AC
  Administered 2015-05-14: 25 mL
  Filled 2015-05-14: qty 50

## 2015-05-14 MED ORDER — SODIUM CHLORIDE 0.9 % IV SOLN
500.0000 mg | Freq: Four times a day (QID) | INTRAVENOUS | Status: DC
  Administered 2015-05-14 – 2015-05-18 (×16): 500 mg via INTRAVENOUS
  Filled 2015-05-14 (×18): qty 500

## 2015-05-14 MED ORDER — ANTISEPTIC ORAL RINSE SOLUTION (CORINZ)
7.0000 mL | Freq: Four times a day (QID) | OROMUCOSAL | Status: DC
  Administered 2015-05-14 – 2015-05-16 (×8): 7 mL via OROMUCOSAL

## 2015-05-14 MED ORDER — SODIUM CHLORIDE 0.9 % IV SOLN
1.0000 g | Freq: Once | INTRAVENOUS | Status: AC
  Administered 2015-05-14: 1 g via INTRAVENOUS
  Filled 2015-05-14: qty 10

## 2015-05-14 MED ORDER — DEXTROSE 50 % IV SOLN
INTRAVENOUS | Status: AC
  Filled 2015-05-14: qty 50

## 2015-05-14 MED ORDER — HEPARIN SODIUM (PORCINE) 1000 UNIT/ML DIALYSIS
1000.0000 [IU] | INTRAMUSCULAR | Status: DC | PRN
  Administered 2015-05-14 – 2015-05-22 (×2): 2400 [IU] via INTRAVENOUS_CENTRAL
  Filled 2015-05-14: qty 6
  Filled 2015-05-14: qty 2
  Filled 2015-05-14: qty 1
  Filled 2015-05-14 (×2): qty 6

## 2015-05-14 MED ORDER — CHLORHEXIDINE GLUCONATE 0.12% ORAL RINSE (MEDLINE KIT)
15.0000 mL | Freq: Two times a day (BID) | OROMUCOSAL | Status: DC
  Administered 2015-05-14 (×2): 15 mL via OROMUCOSAL

## 2015-05-14 MED ORDER — DEXTROSE 50 % IV SOLN
25.0000 mL | Freq: Once | INTRAVENOUS | Status: AC
  Administered 2015-05-14: 25 mL via INTRAVENOUS

## 2015-05-14 MED ORDER — DEXTROSE 10 % IV SOLN
INTRAVENOUS | Status: DC
  Administered 2015-05-14: 500 mL via INTRAVENOUS
  Administered 2015-05-15: 08:00:00 via INTRAVENOUS
  Filled 2015-05-14 (×5): qty 1000

## 2015-05-14 MED ORDER — DEXTROSE 50 % IV SOLN
25.0000 mL | INTRAVENOUS | Status: AC
  Administered 2015-05-14: 25 mL via INTRAVENOUS

## 2015-05-14 MED ORDER — PIPERACILLIN-TAZOBACTAM 3.375 G IVPB: 3.3750 g | Freq: Four times a day (QID) | INTRAVENOUS | Status: DC

## 2015-05-14 MED ORDER — ASPIRIN 81 MG PO CHEW
81.0000 mg | CHEWABLE_TABLET | Freq: Every day | ORAL | Status: DC
  Administered 2015-05-14 – 2015-05-29 (×16): 81 mg via ORAL
  Filled 2015-05-14 (×17): qty 1

## 2015-05-14 MED ORDER — PRISMASOL BGK 0/2.5 32-2.5 MEQ/L IV SOLN
INTRAVENOUS | Status: DC
  Administered 2015-05-14 – 2015-05-15 (×6): via INTRAVENOUS_CENTRAL
  Filled 2015-05-14 (×18): qty 5000

## 2015-05-14 MED ORDER — PIPERACILLIN-TAZOBACTAM IN DEX 2-0.25 GM/50ML IV SOLN
2.2500 g | Freq: Four times a day (QID) | INTRAVENOUS | Status: DC
  Filled 2015-05-14: qty 50

## 2015-05-14 MED ORDER — DEXTROSE-NACL 5-0.45 % IV SOLN
INTRAVENOUS | Status: DC
  Administered 2015-05-14: 12:00:00 via INTRAVENOUS
  Administered 2015-05-14: 1000 mL via INTRAVENOUS
  Administered 2015-05-15: 10:00:00 via INTRAVENOUS
  Administered 2015-05-15: 1000 mL via INTRAVENOUS
  Administered 2015-05-16: 100 mL/h via INTRAVENOUS
  Administered 2015-05-16: 1000 mL via INTRAVENOUS
  Administered 2015-05-17: 03:00:00 via INTRAVENOUS
  Administered 2015-05-20: 1000 mL via INTRAVENOUS

## 2015-05-14 MED ORDER — SODIUM BICARBONATE 8.4 % IV SOLN
50.0000 meq | Freq: Once | INTRAVENOUS | Status: AC
  Administered 2015-05-14: 50 meq via INTRAVENOUS
  Filled 2015-05-14: qty 50

## 2015-05-14 MED ORDER — SODIUM BICARBONATE 8.4 % IV SOLN
INTRAVENOUS | Status: DC
Start: 2015-05-14 — End: 2015-05-15
  Administered 2015-05-14 – 2015-05-15 (×10): via INTRAVENOUS_CENTRAL
  Filled 2015-05-14 (×19): qty 150

## 2015-05-14 MED ORDER — HYDROCORTISONE NA SUCCINATE PF 100 MG IJ SOLR
100.0000 mg | Freq: Two times a day (BID) | INTRAMUSCULAR | Status: DC
  Administered 2015-05-14 – 2015-05-19 (×10): 100 mg via INTRAVENOUS
  Filled 2015-05-14 (×10): qty 2

## 2015-05-14 MED ORDER — SODIUM CHLORIDE 0.9 % IV BOLUS (SEPSIS): 500.0000 mL | INTRAVENOUS | Status: DC | PRN

## 2015-05-14 MED ORDER — IOHEXOL 300 MG/ML  SOLN
25.0000 mL | INTRAMUSCULAR | Status: AC
  Administered 2015-05-14 (×2): 25 mL via ORAL

## 2015-05-14 MED ORDER — HEPARIN (PORCINE) 2000 UNITS/L FOR CRRT
INTRAVENOUS_CENTRAL | Status: DC | PRN
  Administered 2015-05-14: 16:00:00 via INTRAVENOUS_CENTRAL
  Filled 2015-05-14: qty 1000

## 2015-05-14 MED ORDER — NOREPINEPHRINE BITARTRATE 1 MG/ML IV SOLN
0.0000 ug/min | INTRAVENOUS | Status: DC
  Administered 2015-05-14: 5 ug/min via INTRAVENOUS
  Administered 2015-05-15: 13 ug/min via INTRAVENOUS
  Administered 2015-05-16: 15 ug/min via INTRAVENOUS
  Administered 2015-05-19: 1 ug/min via INTRAVENOUS
  Filled 2015-05-14 (×6): qty 16

## 2015-05-14 MED ORDER — CALCIUM CHLORIDE 10 % IV SOLN: 1.0000 g | Freq: Once | INTRAVENOUS | Status: DC

## 2015-05-14 MED ORDER — VANCOMYCIN HCL IN DEXTROSE 1-5 GM/200ML-% IV SOLN: 1000.0000 mg | INTRAVENOUS | Status: DC

## 2015-05-14 MED ORDER — PRISMASOL BGK 0/2.5 32-2.5 MEQ/L IV SOLN
INTRAVENOUS | Status: DC
  Administered 2015-05-14: 08:00:00 via INTRAVENOUS_CENTRAL
  Filled 2015-05-14 (×7): qty 5000

## 2015-05-14 NOTE — Procedures (Signed)
Central Venous Catheter Insertion Procedure Note [name] [id number] [birth year]  Procedure: Insertion of Central Venous Catheter Indications: Assessment of intravascular volume, Drug and/or fluid administration and Frequent blood sampling  Procedure Details Consent: Risks of procedure as well as the alternatives and risks of each were explained to the (patient/caregiver).  Consent for procedure obtained. Time Out: Verified patient identification, verified procedure, site/side was marked, verified correct patient position, special equipment/implants available, medications/allergies/relevent history reviewed, required imaging and test results available.  Performed  Maximum sterile technique was used including antiseptics, cap, gloves, gown, hand hygiene, mask and sheet. Skin prep: Chlorhexidine; local anesthetic administered A antimicrobial bonded/coated triple lumen catheter was placed in the left internal jugular vein using the Seldinger technique. Ultrasound guidance used.Yes.   Catheter placed to 20 cm. Blood aspirated via all 3 ports and then flushed x 3. Line sutured x 2 and dressing applied.  Evaluation Blood flow good Complications: No apparent complications Patient did tolerate procedure well. Chest X-ray ordered to verify placement.  CXR: pending.  Georgann Housekeeper, AGACNP-BC [facility] Pager [phone] or [phone]  05/14/2015 6:35 AM  Baltazar Apo, MD, PhD 05/15/2015, 9:53 PM [facility] [phone] or if no answer [phone]

## 2015-05-14 NOTE — Progress Notes (Signed)
[facility] Progress Note Patient Name: [name] DOB: 1955/10/28 MRN: [id number]   Date of Service  05/14/2015  HPI/Events of Note  Persistent hypoglycemia  eICU Interventions  D10W 50 cc /h     Intervention Category Major Interventions: Other:  Christinia Gully 05/14/2015, 10:24 PM

## 2015-05-14 NOTE — Progress Notes (Signed)
Hypoglycemic Event  CBG: 59  Treatment: D50 IV 25 mL  Symptoms: None  Follow-up CBG: Time:22:25 CBG Result:89  Possible Reasons for Event: Unknown  Comments/MD notified:ELINK    [name] D

## 2015-05-14 NOTE — Progress Notes (Signed)
Patient ID: [name], male   DOB: 05/24/1956, 59 y.o.   MRN: [id number]  Called by Dr.Simonds to discuss most recent labs. Hyperkalemia persists in the face of anuric AKI with slight improvement of metabolic acidosis on HCO3 gtt. Patient now on low dose pressors.  Will start CRRT for correction of multiple metabolic derangements.  Elmarie Shiley MD [facility]. Office # [phone] Pager # [phone] 5:47 AM

## 2015-05-14 NOTE — Progress Notes (Addendum)
Hypoglycemic Event  CBG: 66    Treatment: D50 IV 25 mL  Symptoms: None  Follow-up CBG: [id number] CBG Result:104  Possible Reasons for Event: Inadequate meal intake  Comments/MD notified: Richardson Landry Minor, NP aware.     Casimer Bilis

## 2015-05-14 NOTE — Progress Notes (Signed)
Date: May 14, 2015 Chart reviewed for concurrent status and case management needs. Will continue to follow patient for changes and needs: Velva Harman, RN, BSN, [place]   [phone]

## 2015-05-14 NOTE — Progress Notes (Signed)
Initial Nutrition Assessment  INTERVENTION:   If patient expected to remain intubated > 24-48 hours, recommend nutrition support.  TF recommendations: Initiate Nepro @ 20 ml/hr via OGT and increase by 10 ml every 4 hours to goal rate of 60 ml/hr.   Tube feeding regimen provides 2592 kcal (100% of needs), 117 grams of protein, and 1047 ml of H2O.   RD to continue to monitor for plan  NUTRITION DIAGNOSIS:   Inadequate oral intake related to inability to eat as evidenced by NPO status.  GOAL:   Patient will meet greater than or equal to 90% of their needs  MONITOR:   Vent status, Labs, Weight trends, Skin, I & O's  REASON FOR ASSESSMENT:   Ventilator, Other (Comment) (CRRT)    ASSESSMENT:   59 year old male with past medical history of hypertension, chronic combined CHF, CAD, NSTEMI (on aspirin and plavix), CKD stage 3 who presented to [facility] ED with reports of neck swelling which has been there for some time but has gotten worse in past few days.   Patient was seen earlier in this admission for diet education by another RD. Pt has been eating well with most intakes at 100%. Currently NPO, intubated and on CVVHDF. TF recommendations provided above.  Patient is currently intubated on ventilator support MV: 10 L/min Temp (24hrs), Avg:99.6 F (37.6 C), Min:97.9 F (36.6 C), Max:102.2 F (39 C)  Propofol: none  Nutrition focused physical exam shows no sign of depletion of muscle mass or body fat.  Labs reviewed: Very elevated K Elevated BUN, Creatinine, Mg, Phos  Diet Order:  Diet NPO time specified  Skin:    Lacerations on buttocks and coccyx, open wound on arm  Last BM:  10/27  Height:   Ht Readings from Last 1 Encounters:  05/13/15 5\' 8"  (1.727 m)    Weight:   Wt Readings from Last 1 Encounters:  05/14/15 181 lb 3.5 oz (82.2 kg)    Ideal Body Weight:  70 kg  BMI:  Body mass index is 27.56 kg/(m^2).  Estimated Nutritional Needs:   Kcal:   [id number]  Protein:  115-125g  Fluid:  2L/day  EDUCATION NEEDS:   No education needs identified at this time  Clayton Bibles, MS, RD, LDN Pager: [phone] After Hours Pager: [phone]

## 2015-05-14 NOTE — Progress Notes (Signed)
Hypoglycemic Event  CBG: 65  Treatment: D50 IV 25 mL  Symptoms: None  Follow-up CBG: Time:1200 CBG Result:86  Possible Reasons for Event: Inadequate meal intake  Comments/MD notified:    Casimer Bilis

## 2015-05-14 NOTE — Progress Notes (Signed)
eLink Physician-Brief Progress Note Patient Name: [name] DOB: February 27, 1956 MRN: [id number]   Date of Service  05/14/2015  HPI/Events of Note  Poor venous access Shock  eICU Interventions  Access HD ports on HD cath for medications CVP monitoring. Goal 10-14 PRN NS boluses Norepi to maintain MAP > 65 mmHg Rounding PCCM MD to decide on CVL placement 05/14/15     Intervention Category Major Interventions: Shock - evaluation and management  Merton Border 05/14/2015, 12:43 AM

## 2015-05-14 NOTE — Progress Notes (Signed)
[facility] ROUNDING NOTE  Subjective:  Pt intubated/sedated CRRT initiated earlier this AM d/t progressive acidosis/hyperkalemia Current Prescription:  No heparin, keeping volume even, 0K/2.5Ca dialysate at 1.5 liters/hr, 0K/2.5Ca post filter at 200/hour, isotonic bicarb pre-filter 400/hour  Objective Vital signs in last 24 hours: Filed Vitals:   05/14/15 0700 05/14/15 0800 05/14/15 0900 05/14/15 1000  BP: 132/93 116/81 97/66 109/61  Pulse: 97 99 93 95  Temp: 102 F (38.9 C) 101.1 F (38.4 C)  101.1 F (38.4 C)  TempSrc:  Core (Comment)  Core (Comment)  Resp: 13 22 18 16   Height:      Weight:      SpO2: 99% 99% 98% 98%   Weight change: 1.5 kg (3 lb 4.9 oz)  Intake/Output Summary (Last 24 hours) at 05/14/15 1110 Last data filed at 05/14/15 1100  Gross per 24 hour  Intake 1738.26 ml  Output    658 ml  Net 1080.26 ml   Physical Exam:  BP 109/61 mmHg  Pulse 95  Temp(Src) 101.1 F (38.4 C) (Core (Comment))  Resp 16  Ht 5\' 8"  (1.727 m)  Wt 82.2 kg (181 lb 3.5 oz)  BMI 27.56 kg/m2  SpO2 98%  CVP 13-15 Pt intubated, sedated ETT, right IJ trialysis catheter (10/27), left IJ central line, foley Anteriorly fairly clear S1S2 No S3 or rub Abd tight. + BS and unable to assess tenderness Skin exfoliation from hands, soles of feet 1+ pitting edema LE's  Labs: Basic Metabolic Panel:  Recent Labs Lab 05/10/15 0540 05/12/15 0516 05/12/15 1000 05/13/15 0535 05/13/15 1130 05/13/15 1624 05/14/15 0415  NA 136 137 138 138 137 138 135  K 4.8 5.7* 5.7* 5.6* 5.0 6.1* 6.8*  CL 106 111 112* 111 109 107 104  CO2 21* 18* 19* 19* 18* 13* 17*  GLUCOSE 84 89 76 99 115* 41* 89  BUN 38* 48* 49* 52* 53* 55* 59*  CREATININE 2.77* 3.04* 3.02* 3.15* 3.56* 3.78* 4.13*  CALCIUM 8.5* 8.3* 8.5* 8.4* 8.5* 8.4* 7.6*  PHOS  --   --   --   --   --   --  8.3*     Recent Labs Lab 05/09/15 0559 05/12/15 0516 05/13/15 1624 05/13/15 1948 05/14/15 0415  AST 599* 702* 1159*   --   --   ALT 510* 518* 722*  --   --   ALKPHOS 241* 252* 307*  --   --   BILITOT 3.9* 5.2* 9.0*  --   --   PROT 5.3* 5.9* 7.6  --   --   ALBUMIN 2.6* 2.5* 3.0* 2.8* 2.8*     Recent Labs Lab 05/12/15 0516 05/13/15 0535 05/13/15 1624 05/14/15 0415  WBC 15.9* 8.3 13.0* 10.4  NEUTROABS 5.9  --  5.0 7.8*  HGB 9.9* 9.8* 11.4* 10.5*  HCT 27.1* 27.7* 31.3* 28.9*  MCV 106.3* 107.8* 109.1* 111.2*  PLT 217 195 246 188     Recent Labs Lab 05/14/15 0407 05/14/15 0452 05/14/15 0742 05/14/15 0808 05/14/15 0948  GLUCAP 66 94 66 104* 72   ABG    Component Value Date/Time   PHART 7.312* 05/14/2015 0020   PCO2ART 29.5* 05/14/2015 0020   PO2ART 178* 05/14/2015 0020   HCO3 14.4* 05/14/2015 0020   TCO2 13.8 05/14/2015 0020   ACIDBASEDEF 10.3* 05/14/2015 0020   O2SAT 99.2 05/14/2015 0020   Results for TRAMMELL, LORONA (MRN [id number]) as of 05/14/2015 11:09  Ref. Range 05/06/2015 11:34  Hep A IgM Latest Ref Range:  Negative  Negative  Hepatitis B Surface Ag Latest Ref Range: Negative  Negative  Hep B C IgM Latest Ref Range: Negative  Negative  HCV Ab Latest Ref Range: 0.0-0.9 s/co ratio <0.1   Results for DORN, KARP (MRN [id number]) as of 05/14/2015 11:09  Ref. Range 05/10/2015 05:40  ANA Ab, IFA Unknown Negative  Angiotensin-Converting Enzyme Latest Ref Range: 14-82 U/L 91 (H)  CCP Antibodies IgG/IgA Latest Ref Range: 0-19 units 9  ds DNA Ab Latest Ref Range: 0-9 IU/mL 1  Rhuematoid fact SerPl-aCnc Latest Ref Range: 0.0-13.9 IU/mL <10.0  C-ANCA Latest Ref Range: Neg:<1:20 titer <1:20  P-ANCA Latest Ref Range: Neg:<1:20 titer <1:20  Atypical P-ANCA titer Latest Ref Range: Neg:<1:20 titer <1:20   Results for CROCKETT, RIDDER (MRN [id number]) as of 05/14/2015 11:09  Ref. Range 05/13/2015 19:48  IgE (Immunoglobulin E), Serum Latest Ref Range: 0-100 IU/mL 5125 (H)  Results for MATHANIEL, VIVIRITO (MRN [id number]) as of 05/14/2015 11:09  Ref. Range 05/10/2015 05:40  SSA (Ro) (ENA)  Antibody, IgG Latest Ref Range: 0.0-0.9 AI <0.2  SSB (La) (ENA) Antibody, IgG Latest Ref Range: 0.0-0.9 AI <0.2  Scleroderma (Scl-70) (ENA) Antibody, IgG Latest Ref Range: 0.0-0.9 AI <0.2   10/26Blood cultures: GNR 2/2 cultures Bone marrow - GNR on smear  Studies/Results: US Renal  05/13/2015  CLINICAL DATA:  Chronic renal failure with questionable superimposed acute component EXAM: RENAL / URINARY TRACT ULTRASOUND COMPLETE COMPARISON:  CT abdomen and pelvis May 08, 2015 FINDINGS: Right Kidney: Length: 11.1 cm. Echogenicity is mildly increased. Renal cortical thickness within normal limits. No mass, perinephric fluid, or hydronephrosis visualized. No sonographically demonstrable calculus or ureterectasis. Left Kidney: Length: 10.7 cm. Echogenicity is mildly increased. Renal cortical thickness within within normal limits. No mass, perinephric fluid, or hydronephrosis visualized. No sonographically demonstrable calculus or ureterectasis. Bladder: Appears normal for degree of bladder distention. Incidental note is made of right pleural effusion.  Caryl Never IMPRESSION: Mild increased renal echogenicity, a finding consistent with medical renal disease. Renal cortical thickness normal. No obstructing foci identified. Study otherwise unremarkable except for incidental note of right pleural effusion. Electronically Signed   By: Lowella Grip III M.D.   On: 05/13/2015 21:07   Dg Chest Port 1 View  05/14/2015  CLINICAL DATA:  Left IJ line placement.  Initial encounter. EXAM: PORTABLE CHEST 1 VIEW COMPARISON:  Chest radiograph performed 05/13/2015 FINDINGS: The patient's endotracheal tube is seen ending 4 cm above the carina. A right IJ line is noted ending about the mid SVC. A left IJ line is noted ending about the mid to distal SVC. An enteric tube is noted extending below the diaphragm. Mild vascular congestion is noted. No focal consolidation, pleural effusion or pneumothorax is seen. The cardiomediastinal  silhouette is mildly enlarged. No acute osseous abnormalities are identified. IMPRESSION: 1. Endotracheal tube seen ending 4 cm above the carina. 2. Left IJ line noted ending about the mid to distal SVC. 3. Mild vascular congestion and mild cardiomegaly. Lungs remain grossly clear. Electronically Signed   By: Garald Balding M.D.   On: 05/14/2015 06:55   Portable Chest Xray  05/13/2015  CLINICAL DATA:  Endotracheal tube placement.  Initial encounter. EXAM: PORTABLE CHEST 1 VIEW COMPARISON:  Chest radiograph performed earlier today at 6:00 p.m. FINDINGS: The patient's endotracheal tube is seen ending 2 cm above the carina. A right IJ line is noted ending about the mid SVC. An enteric tube is noted extending below the diaphragm. Minimal bilateral atelectasis is noted. The lungs are  otherwise clear. No pleural effusion or pneumothorax is seen. The cardiomediastinal silhouette is enlarged. No acute osseous abnormalities are identified. IMPRESSION: 1. Endotracheal tube seen ending 2 cm above the carina. 2. Minimal bilateral atelectasis noted.  Lungs otherwise clear. 3. Cardiomegaly. Electronically Signed   By: Garald Balding M.D.   On: 05/13/2015 23:15   Dg Chest Port 1 View  05/13/2015  CLINICAL DATA:  Acute renal failure and congestive heart failure. Central line placement. EXAM: PORTABLE CHEST 1 VIEW COMPARISON:  05/13/2015 FINDINGS: A new right jugular central venous catheter is seen with tip in the mid SVC. No evidence of pneumothorax. Mild cardiomegaly again noted.  Both lungs are clear. IMPRESSION: New right jugular central venous catheter in appropriate position. No evidence of pneumothorax or other acute findings. Electronically Signed   By: Earle Gell M.D.   On: 05/13/2015 18:17   Dg Chest Port 1 View  05/13/2015  CLINICAL DATA:  59 year old male with sepsis. Shortness of breath and weakness. Initial encounter. EXAM: PORTABLE CHEST 1 VIEW COMPARISON:  Portable AP semi upright view at 1432 hours.  08/06/2014 and earlier. FINDINGS: Portable AP semi upright view at 1432 hours. Stable cardiomegaly and mediastinal contours. Stable lung volumes. Allowing for portable technique, the lungs are clear. No pneumothorax or pleural effusion. IMPRESSION: No acute cardiopulmonary abnormality. Electronically Signed   By: Genevie Ann M.D.   On: 05/13/2015 14:53   Dg Abd Portable 2v  05/13/2015  CLINICAL DATA:  Vomiting and generalized abdominal pain. EXAM: PORTABLE ABDOMEN - 2 VIEW COMPARISON:  CT 05/08/2015 FINDINGS: Negative for free air on the left lateral decubitus image. Gaseous distention of stomach with an air-fluid level. There is evidence for gas and stool in the colon. Nonobstructive bowel gas pattern. IMPRESSION: Gaseous distension of the stomach, otherwise, unremarkable abdominal examination. Electronically Signed   By: Markus Daft M.D.   On: 05/13/2015 16:42   Medications: . fentaNYL infusion INTRAVENOUS 200 mcg/hr (05/14/15 1023)  . norepinephrine (LEVOPHED) Adult infusion Stopped (05/14/15 0800)  . dialysis replacement fluid (prismasate) 200 mL/hr at 05/14/15 0756  . dialysate (PRISMASATE) 1,500 mL/hr at 05/14/15 0753  . sodium bicarbonate (isotonic) 1000 mL infusion 400 mL/hr at 05/14/15 1019   . antiseptic oral rinse  7 mL Mouth Rinse BID  . antiseptic oral rinse  7 mL Mouth Rinse QID  . aspirin  81 mg Oral Daily  . chlorhexidine gluconate  15 mL Mouth Rinse BID  . clopidogrel  75 mg Oral Daily  . diphenhydrAMINE  25 mg Oral Q6H  . famotidine  20 mg Oral Daily  . fentaNYL (SUBLIMAZE) injection  50 mcg Intravenous Once  . imipenem-cilastatin  500 mg Intravenous 4 times per day  . magic mouthwash  5 mL Oral QID  . mirabegron ER  25 mg Oral Daily  . sodium chloride  3 mL Intravenous Q12H   BACKGROUND 59 year old African-American man with background of CKD3 (baseline creatinine 1.4-1.8), HTN, ischemic dilated CM with CHF (EF 20-25 percent), history of poor compliance/polysubstance  abuse/homelessness. Presented with a convoluted recent past medical history nausea, vomiting, sore throat with diarrhea and a rash that was treated as an allergic reaction, then, admitted a few days later (on 10/16) for complaints of neck swelling/facial swelling with cough productive of frothy yellow sputum together with sore throat and hives. He is septic with GNR bacteremia (and GNR on smear from BM bx done 10/26), has eosinophilia (uncertain etiology), abnormal LFT's, and acute renal failure in this setting, with metabolic acidosis  and hyperkalemia and we were asked to see. Metabolic derangements failed conservative management and CRRT was initiated 10/27.  1. Acute renal failure (on CKD3) - oligoanuric in setting of sepsis. CRRT initiated. Current CRRT prescription: No heparin, keeping volume even, 0K/2.5Ca dialysate at 1.5 liters/hr, 0K/2.5Ca post filter fluid at 200/hour, isotonic bicarb pre-filter 400/hour 2. Hyperkalemia - K 6.8. CRRT started. No labs since initiation but labs due at noon. Adjust fluids base on upcoming labs 3. Metabolic acidosis - currently receiving isotonic bicarb pre filter at 400 ml/hour 4. Gram negative rod bacteremia/sepsis with GNR blood and BM smear - on primaxin. Source not known. 5. Eosinophilia - etiology not known. Only abnl serology is mildly elevated ACE level 6. Abnormal LFT's    Joseph Maes, MD [facility] [phone] pager 05/14/2015, 11:10 AM

## 2015-05-14 NOTE — Progress Notes (Signed)
Hypoglycemic Event  CBG: 61  Treatment: D50 IV 25 mL  Symptoms: None  Follow-up CBG: Time:1835 CBG Result:90  Possible Reasons for Event: Inadequate meal intake  Comments/MD notified:    Casimer Bilis

## 2015-05-14 NOTE — Progress Notes (Signed)
eLink Physician-Brief Progress Note Patient Name: [name] DOB: 1955/09/20 MRN: [id number]   Date of Service  05/14/2015  HPI/Events of Note  Anuric renal failure Worsening hyperkalemia  eICU Interventions  EKG now Calcium chloride NaHCO3 - one amp push Kayexalate 60 gms per tube Spoke with Renal Service - CRRT to be ordered Will need TLC placed  So that HD ports can be used for CRRT     Intervention Category Major Interventions: Electrolyte abnormality - evaluation and management;Acute renal failure - evaluation and management  Merton Border 05/14/2015, 5:30 AM

## 2015-05-14 NOTE — Progress Notes (Addendum)
ANTIBIOTIC CONSULT NOTE - FOLLOW UP  Pharmacy Consult for Primaxin Indication: GNR bacteremia  No Known Allergies  Patient Measurements: Height: '5\' 8"'  (172.7 cm) Weight: 181 lb 3.5 oz (82.2 kg) IBW/kg (Calculated) : 68.4  Vital Signs: Temp: 101.1 F (38.4 C) (10/27 0800) Temp Source: Core (Comment) (10/27 0800) BP: 116/81 mmHg (10/27 0800) Pulse Rate: 99 (10/27 0800) Intake/Output from previous day: 10/26 0701 - 10/27 0700 In: 1472 [I.V.:902; NG/GT:30; IV Piggyback:410] Out: 450 [Urine:50; Emesis/NG output:400] Intake/Output from this shift: Total I/O In: 106.3 [I.V.:43.8; NG/GT:50; IV Piggyback:12.5] Out: 95 [Urine:22; Other:73]  Labs:  Recent Labs  05/13/15 0535 05/13/15 1130 05/13/15 1624 05/13/15 1854 05/14/15 0415  WBC 8.3  --  13.0*  --  10.4  HGB 9.8*  --  11.4*  --  10.5*  PLT 195  --  246  --  188  LABCREA  --   --   --  236.29  --   CREATININE 3.15* 3.56* 3.78*  --  4.13*   Estimated Creatinine Clearance: 19.6 mL/min (by C-G formula based on Cr of 4.13). No results for input(s): VANCOTROUGH, VANCOPEAK, VANCORANDOM, GENTTROUGH, GENTPEAK, GENTRANDOM, TOBRATROUGH, TOBRAPEAK, TOBRARND, AMIKACINPEAK, AMIKACINTROU, AMIKACIN in the last 72 hours.   Microbiology: Recent Results (from the past 720 hour(s))  Culture, blood (routine x 2)     Status: None   Collection Time: 05/04/15  2:50 AM  Result Value Ref Range Status   Specimen Description BLOOD RIGHT ARM  Final   Special Requests BOTTLES DRAWN AEROBIC AND ANAEROBIC [place]  Final   Culture   Final    NO GROWTH 5 DAYS Performed at [facility]    Report Status 05/09/2015 FINAL  Final  Culture, blood (routine x 2)     Status: None   Collection Time: 05/04/15  2:55 AM  Result Value Ref Range Status   Specimen Description BLOOD RIGHT HAND  Final   Special Requests BOTTLES DRAWN AEROBIC ONLY 10CC  Final   Culture   Final    NO GROWTH 5 DAYS Performed at [facility]    Report Status  05/09/2015 FINAL  Final  Culture, Urine     Status: None   Collection Time: 05/04/15 10:57 PM  Result Value Ref Range Status   Specimen Description URINE, RANDOM  Final   Special Requests NONE  Final   Culture   Final    NO GROWTH 1 DAY Performed at [facility]    Report Status 05/06/2015 FINAL  Final  Culture, Urine     Status: None   Collection Time: 05/11/15  4:21 PM  Result Value Ref Range Status   Specimen Description URINE, CLEAN CATCH  Final   Special Requests Normal  Final   Culture   Final    NO GROWTH 2 DAYS Performed at [facility]    Report Status 05/13/2015 FINAL  Final  Tissue culture     Status: None (Preliminary result)   Collection Time: 05/12/15  9:50 AM  Result Value Ref Range Status   Specimen Description BIOPSY BONE MARROW  Final   Special Requests NONE  Final   Gram Stain   Final    MODERATE WBC PRESENT,BOTH PMN AND MONONUCLEAR NO SQUAMOUS EPITHELIAL CELLS SEEN NO ORGANISMS SEEN Performed at [facility]    Culture   Final    FEW ESCHERICHIA COLI Performed at [facility]    Report Status PENDING  Incomplete  Fungus culture w smear  Status: None (Preliminary result)   Collection Time: 05/12/15  9:50 AM  Result Value Ref Range Status   Specimen Description BIOPSY BONE MARROW  Final   Special Requests NONE  Final   Fungal Smear   Final    NO YEAST OR FUNGAL ELEMENTS SEEN Performed at [facility]    Culture   Final    CULTURE IN PROGRESS FOR FOUR WEEKS Performed at [facility]    Report Status PENDING  Incomplete  AFB culture with smear     Status: None (Preliminary result)   Collection Time: 05/12/15  9:50 AM  Result Value Ref Range Status   Specimen Description BIOPSY BONE MARROW  Final   Special Requests NONE  Final   Acid Fast Smear   Final    NO ACID FAST BACILLI SEEN Performed at [facility]    Culture   Final    CULTURE WILL BE EXAMINED FOR 6 WEEKS BEFORE ISSUING A  FINAL REPORT Performed at [facility]    Report Status PENDING  Incomplete  MRSA PCR Screening     Status: None   Collection Time: 05/13/15  2:59 PM  Result Value Ref Range Status   MRSA by PCR NEGATIVE NEGATIVE Final    Comment:        The GeneXpert MRSA Assay (FDA approved for NASAL specimens only), is one component of a comprehensive MRSA colonization surveillance program. It is not intended to diagnose MRSA infection nor to guide or monitor treatment for MRSA infections.   Culture, blood (x 2)     Status: None (Preliminary result)   Collection Time: 05/13/15  4:05 PM  Result Value Ref Range Status   Specimen Description BLOOD RIGHT ARM  Final   Special Requests BOTTLES DRAWN AEROBIC AND ANAEROBIC  5CC  Final   Culture  Setup Time   Final    GRAM NEGATIVE RODS AEROBIC BOTTLE ONLY CRITICAL RESULT CALLED TO, READ BACK BY AND VERIFIED WITH: L FREI,RN AT 4270 05/14/15 BY L BENFIELD    Culture   Final    GRAM NEGATIVE RODS Performed at [facility]    Report Status PENDING  Incomplete  Culture, blood (x 2)     Status: None (Preliminary result)   Collection Time: 05/13/15  4:24 PM  Result Value Ref Range Status   Specimen Description BLOOD RIGHT ARM  Final   Special Requests BOTTLES DRAWN AEROBIC AND ANAEROBIC  5CC  Final   Culture  Setup Time   Final    GRAM NEGATIVE RODS IN BOTH AEROBIC AND ANAEROBIC BOTTLES CRITICAL RESULT CALLED TO, READ BACK BY AND VERIFIED WITH: S TROTS '@0600'  05/14/15 MKELLY Performed at [facility]    Culture PENDING  Incomplete   Report Status PENDING  Incomplete    Anti-infectives    Start     Dose/Rate Route Frequency Ordered Stop   05/15/15 1600  vancomycin (VANCOCIN) IVPB 1000 mg/200 mL premix  Status:  Discontinued     1,000 mg 200 mL/hr over 60 Minutes Intravenous Every 48 hours 05/13/15 1507 05/13/15 2028   05/14/15 2200  vancomycin (VANCOCIN) IVPB 1000 mg/200 mL premix  Status:  Discontinued     1,000 mg 200  mL/hr over 60 Minutes Intravenous Every 24 hours 05/14/15 0633 05/14/15 0640   05/14/15 1200  piperacillin-tazobactam (ZOSYN) IVPB 2.25 g     2.25 g 100 mL/hr over 30 Minutes Intravenous 4 times per day 05/14/15 0910  05/13/15 2200  piperacillin-tazobactam (ZOSYN) IVPB 3.375 g  Status:  Discontinued     3.375 g 12.5 mL/hr over 240 Minutes Intravenous 3 times per day 05/13/15 1507 05/14/15 0910   05/13/15 2200  vancomycin (VANCOCIN) IVPB 1000 mg/200 mL premix  Status:  Discontinued     1,000 mg 200 mL/hr over 60 Minutes Intravenous Every 48 hours 05/13/15 2028 05/14/15 0631   05/13/15 1430  piperacillin-tazobactam (ZOSYN) IVPB 3.375 g  Status:  Discontinued     3.375 g 100 mL/hr over 30 Minutes Intravenous  Once 05/13/15 1428 05/13/15 2028   05/13/15 1430  vancomycin (VANCOCIN) IVPB 1000 mg/200 mL premix  Status:  Discontinued     1,000 mg 200 mL/hr over 60 Minutes Intravenous  Once 05/13/15 1428 05/13/15 2028   05/07/15 0700  vancomycin (VANCOCIN) IVPB 1000 mg/200 mL premix  Status:  Discontinued     1,000 mg 200 mL/hr over 60 Minutes Intravenous Every 24 hours 05/07/15 0611 05/08/15 1438   05/04/15 1100  piperacillin-tazobactam (ZOSYN) IVPB 3.375 g  Status:  Discontinued     3.375 g 12.5 mL/hr over 240 Minutes Intravenous 3 times per day 05/04/15 1003 05/08/15 1438   05/04/15 0600  vancomycin (VANCOCIN) IVPB 750 mg/150 ml premix  Status:  Discontinued     750 mg 150 mL/hr over 60 Minutes Intravenous Every 24 hours 05/04/15 0557 05/07/15 0610   05/03/15 2200  clindamycin (CLEOCIN) IVPB 600 mg  Status:  Discontinued     600 mg 100 mL/hr over 30 Minutes Intravenous 3 times per day 05/03/15 1524 05/04/15 0225   05/03/15 1330  clindamycin (CLEOCIN) IVPB 600 mg     600 mg 100 mL/hr over 30 Minutes Intravenous  Once 05/03/15 1326 05/03/15 1457     Assessment: 17 yoM with hx homelessness, polysubstance abuse with crack cocaine, CHF, CAD, and HTN admitted with neck pain and swelling. Pt  initially treated with broad spectrum antibiotics for concern for sepsis however antibiotics stopped on 10/22 per ID recommendations as no source identified. CT and MRI neck neg, CT a/p neg, EBC, CMV IgM's neg. Bone marrow biopsy performed 10/25 growing E.Coli.  Given new fever, acute onset SOB, tachycardia, pharmacy consulted to resume vancomycin and zosyn for sepsis; Changed to Primaxin with GNR in 2/2 blood Cx and MD wanting to r/o Zosyn as cause of rash on upper body.  10/16 >> Clinda >> 10/16 10/17 >>Vanc >> 10/21, 10/26 >> 10/27 10/17 >> Zosyn >> 10/21, 10/26 >> 10/26 10/27 >> Primaxin >>   10/17 blood: NGF 10/17 urine: NGF 10/24 urine: NGF   10/25 BM biopsy: Few E.Coli 10/26 blood: 2/2 with GNR  Temp: continues to spike increasingly high fevers, now to 102.2 WBC: decreased to wnl Renal: CRRT, effluent rate 26 ml/kg/hr (target 20-35 ml/kg/hr to equate to CrCl 25-50) PCT increased now > 10 c/w sepsis LA peaked at 10.4 on 10/26, now 5.6  Goal of Therapy:  Eradication of infection Appropriate antibiotic dosing for indication and renal function  Plan:  Day 2 antibiotics restart  Primaxin 500 mg IV q6h  Follow clinical course, renal function, culture results as available  Follow for de-escalation of antibiotics and LOT   Reuel Boom, PharmD, BCPS Pager: [phone] 05/14/2015, 9:24 AM

## 2015-05-14 NOTE — Progress Notes (Signed)
CRITICAL VALUE ALERT  Critical value received:  Lactic Acid 5.4  Date of notification:  05/13/15  Time of notification:  23:40  Critical value read back:Yes.    Nurse who received alert:  Piya Mesch D. Halie Gass  MD notified (1st page):  ELINK  Time of first page:  23:45  MD notified (2nd page): N/A  Time of second page: N/A  Responding MD:  Dr. Alva Garnet  Time MD responded:  23:45

## 2015-05-14 NOTE — Progress Notes (Addendum)
Adult (Non-Pregnant) Hypoglycemia Protocol Treatment Guidelines  1.  RN shall initiate Hypoglycemia Protocol emergency measures immediately when:            w Routine or STAT CBG and/or a lab glucose indicates hypoglycemia (CBG < 70 mg/dl)  2.  Treat the patient according to ability to take PO's and severity of hypoglycemia.   3.  If patient is on GlucoStabilizer, follow directions provided by the GlucoStabilizer for hypoglycemic events.  4.  If patient on insulin pump, follow Hypoglycemia Protocol.  If patient requires more than one treatment have patient place pump in SUSPEND and notify MD.  DO NOT leave pump in SUSPEND for greater than 30 minutes unless ordered by MD.  A.  Treatment for Mild or Moderate-Patient cooperative and able to swallow    1.  Patient taking PO's and can cooperate   a.  Give one of the following 15 gram CHO options:                           w 1 tube oral dextrose gel                           w 3-4 Glucose tablets                           w 4 oz. Juice                           w 4 oz. regular soda                                    ESRD patients:  clear, regular soda                           w 8 oz. skim milk    b.  Recheck CBG in 15 minutes after treatment                            w If CBG < 70 mg/dl, repeat treatment and recheck until hypoglycemia is resolved                            w If CBG > 70 mg/dl and next meal is more than 1 hour away, give additional 15 grams CHO   2.  Patient NPO-Patient cooperative and no altered mental status    a.  Give 25 ml of D50 IV.   b.  Recheck CBG in 15 minutes after treatment.                             w If CBG is less than 70 mg/dl, repeat treatment and recheck until hypoglycemia is resolved.   c.  Notify MD for further orders.             SPECIAL CONSIDERATIONS:    a.  If no IV access,                              w Start IV of D5W at KVO                               w Give 25 ml of D50 IV.    b.  If  unable to gain IV access                             w Give Glucagon IM:    i.  1 mg if patient weighs more than 45.5 kg    ii.  0.5 mg if patient weighs less than 45.5 kg   c.  Notify MD for further orders  B.  Treatment for Severe-- Patient unconscious or unable to take PO's safely    1.  Position patient on side   2.  Give 50 ml D50 IV   3.  Recheck CBG in 15 minutes.                    w If CBG is less than 70 mg/dl, repeat treatment and recheck until hypoglycemia is resolved.   4.  Notify MD for further orders.    SPECIAL CONSIDERATIONS:    a.  If no IV access                              w Give Glucagon IM                                        i.  1 mg if patient weighs more than 45.5 kg                                       ii.  0.5 mg if patient weighs less than 45.5 kg                              w Start IV of D5W at 50 ml/hr and give 50 ml D50 IV   b.  If no IV access and active seizure                               w Call Rapid Response   c.  If unable to gain IV access, give Glucagon IM:                              w 1 mg if patient weighs more than 45.5 kg                              w 0.5 mg if patient weighs less than 45.5 kg   d.  Notify MD for further orders.  C.  Complete smart text progress note to document intervention and follow-up CBG   1.  In [facility] patient chart, click on Notes (left side of screen)   2.  Create Progress Note   3.  Click on [employer].  In the Match box type "hypo" and enter    4.  Double click on [facility] IP HYPOGLYCEMIC EVENT and enter data   5.  MD must be notified if patient is NPO or experienced severe hypoglycemia Hypoglycemic Event  CBG: 66  Treatment: D50  IV 25 mL  Symptoms: None  Follow-up CBG: Time:08:08 CBG Result:104  Possible Reasons for Event: Unknown  Comments/MD notified: N/A    Hanako Tipping D

## 2015-05-14 NOTE — Progress Notes (Signed)
PT Cancellation Note  Patient Details Name: [name] MRN: [id number] DOB: 1955/11/16   Cancelled Treatment:    Reason Eval/Treat Not Completed: Patient not medically ready. Pt now on vent and CVVH. Will sign off. Please reorder once pt is medically ready for physical therapy. Thanks.    Weston Anna, MPT Pager: [phone]

## 2015-05-14 NOTE — Progress Notes (Signed)
PCCM INTERVAL PROGRESS NOTE  Gram neg rods in blood  Plan: D/c vancomycin Continue to follow for sensitivity   Georgann Housekeeper, AGACNP-BC [facility] Pulmonology/Critical Care Pager [phone] or [phone]  05/14/2015 6:38 AM

## 2015-05-14 NOTE — Progress Notes (Signed)
CRRT clotted at 1505. Filter replaced.

## 2015-05-14 NOTE — Progress Notes (Signed)
CRITICAL VALUE ALERT  Critical value received:  K+ 6.8; Lactic Acid 5.6  Date of notification:  05/14/15  Time of notification:  05:20  Critical value read back:Yes.    Nurse who received alert:  Babetta Paterson D. Rucha Wissinger  MD notified (1st page):  ELINK  Time of first page:  05:20  MD notified (2nd page): N/A  Time of second page: N/A  Responding MD:  Dr. Alva Garnet  Time MD responded:  05:20

## 2015-05-14 NOTE — Progress Notes (Addendum)
Hypoglycemic Event  CBG: 61  Treatment: D50 IV 25 mL  Symptoms: None  Follow-up CBG: Time:1600 CBG Result:89  Possible Reasons for Event: Inadequate meal intake  Comments/MD notified: Dr. Melvyn Novas Aware. Dextrose fluids increased    Casimer Bilis

## 2015-05-14 NOTE — Progress Notes (Signed)
Name: [name] MRN: [id number] DOB: 08-Sep-1955    ADMISSION DATE:  05/03/2015 CONSULTATION DATE:  05/13/15  REFERRING MD :  Dr. Doyle Askew   CHIEF COMPLAINT:  Fever of unknown origin, tachypnea/tachycardia  BRIEF PATIENT DESCRIPTION: 59 y/o M admitted 10/16 with complaints of neck swelling.  He was also seen in the ER on 10/13 for nausea / vomiting, sore throat, diarrhea and rash.  He was treated for a possible allergic reaction and released.      SIGNIFICANT EVENTS  10/13  Seen in ER for N/V, sore throat, diarrhea & rash 10/16  Admit to [facility] for facial swelling, cough with frothy yellow sputum, sore throat and hives. 10/26 intubated 10/27 cvvh started  STUDIES:  10/13  RUQ [place] >> mild gallbladder wall thickening, no gallstones observed 10/16  CXR >> no acute infiltrate 10/16  CT Soft tissue Neck >> asymmetric enlargement of R submandibular gland relative to the left is non-specific but could be due to infection, inflammation or tumor.  Negative for abscess, diffuse subcutaneous edema 10/17  MRI Soft Tissue Neck >> motion degraded study, no discrete mass, grossly similar subcutaneous edema involving the bilateral submandibular spaces (nonspecific but must consider infection/cellulitis), no discrete abscess  10/21  CXR >> no acute infiltrate 10/21  CT Abd/Pelvis >> diffuse body wall edema suggestive of anasarca, mesenteric edema but no overt ascites, no acute intra-abdominal abnormalities, stable prostate gland enlargement and small hiatal hernia    HISTORY OF PRESENT ILLNESS:  59 y/o M, homeless male (lives in a hotel) with PMH of HTN, NSTEMI, HLD, arthritis, anxiety / depression, former polysubstance abuse with cardiomyopathy (EF20%), CKD IV and medical non-compliance who presented to [facility] on 10/16 with complaints of neck/face swelling with hives.    He was seen previously in the ER on 10/13 for nausea / vomiting, sore throat, diarrhea and rash.  The patient was treated for a  possible allergic reaction and released.  He had a RUQ [place] that showed contracted gallbladder with thickening likely secondary to this. It is no right upper quadrant fluid and no signs of biliary ductal dilatation  On 10/16, he initially reported facial swelling, cough with frothy yellow sputum, sore throat and hives.  He reported ongoing facial swelling after 10/13 discharge.  He denied difficulty breathing or swallowing.  Labs at that time - Na 131, K 4.8, Sr Cr 2.26, glucose 63, AST 299, ALT 283, bilirubin 2.0, BNP 752, Troponin 0.05, lipase 16, WBC 16.8, Hgb 17.8, and platelets 253.  Differential showed 10.4 lymph's, eosinophils 1.8, crenated RBC's and atypical lymphocytes.   Initial CXR without acute process.  Neck swelling was evaluated with a CT of the neck was evaluated which showed asymmetric enlargement of R submandibular gland relative to the left is non-specific but could be due to infection, inflammation or tumor.  Negative for abscess. Diffuse subcutaneous edema and follow up MRI recommended.  MRI of the soft tissues of neck showed no discrete mass or abscess.   UDS and alcohol level on admission were normal. Hepatitis A, B, and C serologies were nonrevealing.HIDA scan showed normal filling of the gallbladder with radiotracer, indicating cystic duct patency, which is not consistent with acute cholecystitis. Prompt radiotracer excretion into the common bile duct and small bowel, with no evidence of biliary obstruction. Normal liver radiotracer uptake and excretion.  Patient reports that he had been drinking prior to admission he has several beers daily and vodka (noted macrocytic anemia on CBC). He reported that he had been  having some epigastric and right upper quadrant pain for several days prior to admission. This was associated with nausea which tended to be worse on an empty stomach, and occasional vomiting. He had no radiation of this pain into his back. He has been having heartburn for 5  days a week for months for which he will use Rolaids.  Follow up lab work on the 17th noted his LFTs trending down, but on the 19th he was noted to have a total bili of 3, ALT 419, AST 547, and alkaline phosphatase 155.  10/20 he was noted to have total bili 3.2, ALT 461, AST 561, alkaline phosphatase 197.    The patient has also had intermittent fever and rash.  Interestingly, WBC reduced when antibiotics were stopped.  Eosinophils rose to 4.1K on 10/25.  On 10/26, he developed tachycardia, tachypnea and fever to 101.  Current labs: WBC 8.1, hgb 9.8, MCV 107/109.  HIV negative.  Blood cultures negative to date.  To further work up fever, on 10/26 he underwent a bone marrow biopsy.  He developed fever, tachypnea and tachycardia on 10/26 and was transferred to ICU.      SUBJECTIVE:   Sedated on vent on pressors  VITAL SIGNS: Temp:  [97.9 F (36.6 C)-102.2 F (39 C)] 101.1 F (38.4 C) (10/27 0800) Pulse Rate:  [29-102] 99 (10/27 0800) Resp:  [13-39] 22 (10/27 0800) BP: (61-132)/(40-114) 116/81 mmHg (10/27 0800) SpO2:  [84 %-100 %] 99 % (10/27 0800) FiO2 (%):  [40 %-55 %] 40 % (10/27 0805) Weight:  [181 lb 3.5 oz (82.2 kg)] 181 lb 3.5 oz (82.2 kg) (10/27 0436)  PHYSICAL EXAMINATION: General:  Ill appearing adult male intubated and sedated Neuro:  Sedated on vent HEENT:  OTT-> vent no JVD, sclera icterus  Cardiovascular:  s1s2 rrr, no m/r/g, tachy  Lungs:  Tachypnea, non-labored, lungs bilaterally clear, diminished lower  Abdomen:  NTND, bsx4 active  Musculoskeletal:  No acute deformities  Skin: generalized edema, erythema, areas of peeling on extremities, fine raised rash on chest wall/neck, pale grey in appearance   Recent Labs Lab 05/13/15 1130 05/13/15 1624 05/14/15 0415  NA 137 138 135  K 5.0 6.1* 6.8*  CL 109 107 104  CO2 18* 13* 17*  BUN 53* 55* 59*  CREATININE 3.56* 3.78* 4.13*  GLUCOSE 115* 41* 89    Recent Labs Lab 05/13/15 0535 05/13/15 1624 05/14/15 0415    HGB 9.8* 11.4* 10.5*  HCT 27.7* 31.3* 28.9*  WBC 8.3 13.0* 10.4  PLT 195 246 188   US Renal  05/13/2015  CLINICAL DATA:  Chronic renal failure with questionable superimposed acute component EXAM: RENAL / URINARY TRACT ULTRASOUND COMPLETE COMPARISON:  CT abdomen and pelvis May 08, 2015 FINDINGS: Right Kidney: Length: 11.1 cm. Echogenicity is mildly increased. Renal cortical thickness within normal limits. No mass, perinephric fluid, or hydronephrosis visualized. No sonographically demonstrable calculus or ureterectasis. Left Kidney: Length: 10.7 cm. Echogenicity is mildly increased. Renal cortical thickness within within normal limits. No mass, perinephric fluid, or hydronephrosis visualized. No sonographically demonstrable calculus or ureterectasis. Bladder: Appears normal for degree of bladder distention. Incidental note is made of right pleural effusion.  Caryl Never IMPRESSION: Mild increased renal echogenicity, a finding consistent with medical renal disease. Renal cortical thickness normal. No obstructing foci identified. Study otherwise unremarkable except for incidental note of right pleural effusion. Electronically Signed   By: Lowella Grip III M.D.   On: 05/13/2015 21:07   Ct Biopsy  05/12/2015  CLINICAL  DATA:  59 year old male of uncertain hematologic diagnosis. He has been referred for bone marrow biopsy. EXAM: CT-GUIDED BONE MARROW BIOPSY MEDICATIONS AND MEDICAL HISTORY: Versed 1.5 mg, Fentanyl 50 mcg. Additional Medications: None. ANESTHESIA/SEDATION: Moderate sedation time: 22 minutes PROCEDURE: The procedure risks, benefits, and alternatives were explained to the patient. Questions regarding the procedure were encouraged and answered. The patient understands and consents to the procedure. Scout CT of the pelvis was performed for surgical planning purposes. The posterior pelvis was prepped with Betadinein a sterile fashion, and a sterile drape was applied covering the operative field. A  sterile gown and sterile gloves were used for the procedure. Local anesthesia was provided with 1% Lidocaine. We targeted the right posterior iliac bone for biopsy. The skin and subcutaneous tissues were infiltrated with 1% lidocaine without epinephrine. A small stab incision was made with an 11 blade scalpel, and an 11 gauge Murphy needle was advanced with CT guidance to the posterior cortex. Manual forced was used to advance the needle through the posterior cortex and the stylet was removed. A bone marrow aspirate was retrieved and passed to a cytotechnologist in the room. The Murphy needle was then advanced without the stylet for a core biopsy. Multiple core biopsy were attempted to be retrieved, with a paucity of marrow retrieved. Core biopsy was sent for pathology and for culture. Manual pressure was used for hemostasis and a sterile dressing was placed. No complications were encountered no significant blood loss was encountered. Patient tolerated the procedure well and remained hemodynamically stable throughout. FINDINGS: Scout image demonstrates safe approach to posterior iliac bone. Images during the case demonstrate placement of 11 gauge Murphy needle COMPLICATIONS: None IMPRESSION: Status post CT-guided bone marrow biopsy, with tissue specimen sent to pathology for complete histopathologic analysis Signed, Dulcy Fanny. Earleen Newport, DO [facility] Electronically Signed   By: Corrie Mckusick D.O.   On: 05/12/2015 13:12   Dg Chest Port 1 View  05/14/2015  CLINICAL DATA:  Left IJ line placement.  Initial encounter. EXAM: PORTABLE CHEST 1 VIEW COMPARISON:  Chest radiograph performed 05/13/2015 FINDINGS: The patient's endotracheal tube is seen ending 4 cm above the carina. A right IJ line is noted ending about the mid SVC. A left IJ line is noted ending about the mid to distal SVC. An enteric tube is noted extending below the diaphragm. Mild vascular congestion  is noted. No focal consolidation, pleural effusion or pneumothorax is seen. The cardiomediastinal silhouette is mildly enlarged. No acute osseous abnormalities are identified. IMPRESSION: 1. Endotracheal tube seen ending 4 cm above the carina. 2. Left IJ line noted ending about the mid to distal SVC. 3. Mild vascular congestion and mild cardiomegaly. Lungs remain grossly clear. Electronically Signed   By: Garald Balding M.D.   On: 05/14/2015 06:55   Portable Chest Xray  05/13/2015  CLINICAL DATA:  Endotracheal tube placement.  Initial encounter. EXAM: PORTABLE CHEST 1 VIEW COMPARISON:  Chest radiograph performed earlier today at 6:00 p.m. FINDINGS: The patient's endotracheal tube is seen ending 2 cm above the carina. A right IJ line is noted ending about the mid SVC. An enteric tube is noted extending below the diaphragm. Minimal bilateral atelectasis is noted. The lungs are otherwise clear. No pleural effusion or pneumothorax is seen. The cardiomediastinal silhouette is enlarged. No acute osseous abnormalities are identified. IMPRESSION: 1. Endotracheal tube seen ending 2 cm above the carina. 2. Minimal bilateral atelectasis noted.  Lungs otherwise clear. 3. Cardiomegaly. Electronically Signed  By: Garald Balding M.D.   On: 05/13/2015 23:15   Dg Chest Port 1 View  05/13/2015  CLINICAL DATA:  Acute renal failure and congestive heart failure. Central line placement. EXAM: PORTABLE CHEST 1 VIEW COMPARISON:  05/13/2015 FINDINGS: A new right jugular central venous catheter is seen with tip in the mid SVC. No evidence of pneumothorax. Mild cardiomegaly again noted.  Both lungs are clear. IMPRESSION: New right jugular central venous catheter in appropriate position. No evidence of pneumothorax or other acute findings. Electronically Signed   By: Earle Gell M.D.   On: 05/13/2015 18:17   Dg Chest Port 1 View  05/13/2015  CLINICAL DATA:  59 year old male with sepsis. Shortness of breath and weakness. Initial  encounter. EXAM: PORTABLE CHEST 1 VIEW COMPARISON:  Portable AP semi upright view at 1432 hours. 08/06/2014 and earlier. FINDINGS: Portable AP semi upright view at 1432 hours. Stable cardiomegaly and mediastinal contours. Stable lung volumes. Allowing for portable technique, the lungs are clear. No pneumothorax or pleural effusion. IMPRESSION: No acute cardiopulmonary abnormality. Electronically Signed   By: Genevie Ann M.D.   On: 05/13/2015 14:53   Dg Abd Portable 2v  05/13/2015  CLINICAL DATA:  Vomiting and generalized abdominal pain. EXAM: PORTABLE ABDOMEN - 2 VIEW COMPARISON:  CT 05/08/2015 FINDINGS: Negative for free air on the left lateral decubitus image. Gaseous distention of stomach with an air-fluid level. There is evidence for gas and stool in the colon. Nonobstructive bowel gas pattern. IMPRESSION: Gaseous distension of the stomach, otherwise, unremarkable abdominal examination. Electronically Signed   By: Markus Daft M.D.   On: 05/13/2015 16:42    ASSESSMENT / PLAN:  PULMONARY A: Acute respiratory failure due to metabolic acidosis P:   - Vent bundle  CARDIOVASCULAR R IJ HD catheter 10/26 >>  Lt I J CVL 10/27>> A:  Relative hypotension Moderate mitral regurgitation Dilated cardiomyopathy  Hx CAD Hx HTN P:  - volume support - consider possible infiltrative process, myocarditis.  -Pressor support as needed  RENAL Lab Results  Component Value Date   CREATININE 4.13* 05/14/2015   CREATININE 3.78* 05/13/2015   CREATININE 3.56* 05/13/2015   CREATININE 1.75* 04/13/2015    A:  Acute renal failure.  Acute metabolic acidosis due to the above and to lactic acidosis.  P:   - CVVH started 10/27 - will need to further eval for source lactic acidosis, ? Consider repeat Ct abdomen to look for occult infxn. No other obvious ischemic source seen. Consider resp muscle fatigue as lactate source  GASTROINTESTINAL A:  ? EtOH liver disease P:   - Follow transaminases - note RUQ [place] and  CT abd and other hepatic w/u above  HEMATOLOGIC A:  Peripheral eosinophilia.  Entire presentation is suspicious for Churg-Strauss, although no pulmonary infiltrates.  IgE 5125 P:  - await results from BM bx - suspect either Churg-Strauss or a toxic drug reaction - will check ANCA and a-MPO Ab's, follow eosinophils - IgE ordered, consider paracytic infxn - will need to balance risk of infxn vs risk of drug rxn to abx.  - need to consider empiric high dose steroids  INFECTIOUS A:  Fever Suspected recent submandibular cellulitis  GNR on bone marrow bx ??  P:   BC 10/17 >> negative UC 10/17 >> negative BCx2 10/26 >> gnr>> UC 10/26 >>  Bone marrow 10/26 >> rare E coli (? Contaminant)  Stool O&P 10/26 >>  HIV 10/21 >> negative CMV 10/21 >. Negative EBV 10/21 >> negative  clinda x 1 10/17  vanco 10/17 >> 10/21  Zosyn 10/17 >> 10/21 vanco 10/26 >> 10/27 Zosyn 10/26 >>   - Continue zosyn, GNR on 2/2 cultures.   DERMATOLOGY:  A: skin peeling rash, without clear involvement of lower layers, palpable  P: - will need a skin bx, particularly if region of bullous disease or sloughing develops.   ENDOCRINE CBG (last 3)   Recent Labs  05/14/15 0407 05/14/15 0452 05/14/15 0742  GLUCAP 66 94 66     Hypoglycemia P:   - follow CBGs  NEUROLOGIC A:  No focal issues Sedated for vent tolerance P:   RASS goal: -1   FAMILY  - Updates: no family at bedside Pt intubated and sedated - Inter-disciplinary family meet or Palliative Care meeting due by:  05/18/15     TODAY'S SUMMARY:  Started on CVVH and pressors. Pressors weaning. K+ >6.0 prior to CVVH.   Richardson Landry Minor ACNP [facility] Shape PCCM Pager [phone] till 3 pm If no answer page [phone] 05/14/2015, 8:55 AM

## 2015-05-14 NOTE — Progress Notes (Signed)
[facility] for Infectious Disease   Date of Admission:  05/03/2015  Antibiotics: imipenem  Subjective: Intubated, off pressors since this am  Objective:  CONSTITUTIONAL:severely ill , intubated, sedated Filed Vitals:   05/14/15 1200  BP: 85/59  Pulse: 81  Temp: 99.1 F (37.3 C)  Resp: 14  eyes: icteric HENM: intubated CV: tachy RR Respiratory: on vent GI: soft, normoactive bowel sounds GU: + foley Ext: no edema   Review of systems not obtained due to patient factors.  Lab Results Lab Results  Component Value Date   WBC 10.4 05/14/2015   HGB 10.5* 05/14/2015   HCT 28.9* 05/14/2015   MCV 111.2* 05/14/2015   PLT 188 05/14/2015    Lab Results  Component Value Date   CREATININE 3.38* 05/14/2015   BUN 51* 05/14/2015   NA 139 05/14/2015   K 4.8 05/14/2015   CL 102 05/14/2015   CO2 27 05/14/2015    Lab Results  Component Value Date   ALT 722* 05/13/2015   AST 1159* 05/13/2015   ALKPHOS 307* 05/13/2015   BILITOT 9.0* 05/13/2015      Microbiology: Recent Results (from the past 240 hour(s))  Culture, Urine     Status: None   Collection Time: 05/04/15 10:57 PM  Result Value Ref Range Status   Specimen Description URINE, RANDOM  Final   Special Requests NONE  Final   Culture   Final    NO GROWTH 1 DAY Performed at [facility]    Report Status 05/06/2015 FINAL  Final  Culture, Urine     Status: None   Collection Time: 05/11/15  4:21 PM  Result Value Ref Range Status   Specimen Description URINE, CLEAN CATCH  Final   Special Requests Normal  Final   Culture   Final    NO GROWTH 2 DAYS Performed at [facility]    Report Status 05/13/2015 FINAL  Final  Tissue culture     Status: None   Collection Time: 05/12/15  9:50 AM  Result Value Ref Range Status   Specimen Description BIOPSY BONE MARROW  Final   Special Requests NONE  Final   Gram Stain   Final    MODERATE WBC PRESENT,BOTH PMN AND MONONUCLEAR NO SQUAMOUS EPITHELIAL  CELLS SEEN NO ORGANISMS SEEN Performed at [facility]    Culture   Final    FEW ESCHERICHIA COLI Performed at [facility]    Report Status 05/14/2015 FINAL  Final   Organism ID, Bacteria ESCHERICHIA COLI  Final      Susceptibility   Escherichia coli - MIC*    AMPICILLIN >=32 RESISTANT Resistant     AMPICILLIN/SULBACTAM >=32 RESISTANT Resistant     CEFAZOLIN >=64 RESISTANT Resistant     CEFEPIME <=1 SENSITIVE Sensitive     CEFTAZIDIME <=1 SENSITIVE Sensitive     CEFTRIAXONE <=1 SENSITIVE Sensitive     CIPROFLOXACIN <=0.25 SENSITIVE Sensitive     GENTAMICIN <=1 SENSITIVE Sensitive     IMIPENEM <=0.25 SENSITIVE Sensitive     PIP/TAZO 8 SENSITIVE Sensitive     TOBRAMYCIN <=1 SENSITIVE Sensitive     TRIMETH/SULFA Value in next row Sensitive      <=20 SENSITIVE(NOTE)    * FEW ESCHERICHIA COLI  Fungus culture w smear     Status: None (Preliminary result)   Collection Time: 05/12/15  9:50 AM  Result Value Ref Range Status   Specimen Description BIOPSY BONE MARROW  Final   Special Requests  NONE  Final   Fungal Smear   Final    NO YEAST OR FUNGAL ELEMENTS SEEN Performed at [facility]    Culture   Final    CULTURE IN PROGRESS FOR FOUR WEEKS Performed at [facility]    Report Status PENDING  Incomplete  AFB culture with smear     Status: None (Preliminary result)   Collection Time: 05/12/15  9:50 AM  Result Value Ref Range Status   Specimen Description BIOPSY BONE MARROW  Final   Special Requests NONE  Final   Acid Fast Smear   Final    NO ACID FAST BACILLI SEEN Performed at [facility]    Culture   Final    CULTURE WILL BE EXAMINED FOR 6 WEEKS BEFORE ISSUING A FINAL REPORT Performed at [facility]    Report Status PENDING  Incomplete  MRSA PCR Screening     Status: None   Collection Time: 05/13/15  2:59 PM  Result Value Ref Range Status   MRSA by PCR NEGATIVE NEGATIVE Final    Comment:        The GeneXpert MRSA  Assay (FDA approved for NASAL specimens only), is one component of a comprehensive MRSA colonization surveillance program. It is not intended to diagnose MRSA infection nor to guide or monitor treatment for MRSA infections.   Culture, blood (x 2)     Status: None (Preliminary result)   Collection Time: 05/13/15  4:05 PM  Result Value Ref Range Status   Specimen Description BLOOD RIGHT ARM  Final   Special Requests BOTTLES DRAWN AEROBIC AND ANAEROBIC  5CC  Final   Culture  Setup Time   Final    GRAM NEGATIVE RODS AEROBIC BOTTLE ONLY CRITICAL RESULT CALLED TO, READ BACK BY AND VERIFIED WITH: L FREI,RN AT [id number] 05/14/15 BY L BENFIELD    Culture   Final    GRAM NEGATIVE RODS Performed at [facility]    Report Status PENDING  Incomplete  Culture, blood (x 2)     Status: None (Preliminary result)   Collection Time: 05/13/15  4:24 PM  Result Value Ref Range Status   Specimen Description BLOOD RIGHT ARM  Final   Special Requests BOTTLES DRAWN AEROBIC AND ANAEROBIC  5CC  Final   Culture  Setup Time   Final    GRAM NEGATIVE RODS IN BOTH AEROBIC AND ANAEROBIC BOTTLES CRITICAL RESULT CALLED TO, READ BACK BY AND VERIFIED WITH: S TROTS @0600  05/14/15 MKELLY    Culture   Final    NO GROWTH < 24 HOURS Performed at [facility]    Report Status PENDING  Incomplete    Studies/Results: US Renal  05/13/2015  CLINICAL DATA:  Chronic renal failure with questionable superimposed acute component EXAM: RENAL / URINARY TRACT ULTRASOUND COMPLETE COMPARISON:  CT abdomen and pelvis May 08, 2015 FINDINGS: Right Kidney: Length: 11.1 cm. Echogenicity is mildly increased. Renal cortical thickness within normal limits. No mass, perinephric fluid, or hydronephrosis visualized. No sonographically demonstrable calculus or ureterectasis. Left Kidney: Length: 10.7 cm. Echogenicity is mildly increased. Renal cortical thickness within within normal limits. No mass, perinephric fluid, or  hydronephrosis visualized. No sonographically demonstrable calculus or ureterectasis. Bladder: Appears normal for degree of bladder distention. Incidental note is made of right pleural effusion.  Caryl Never IMPRESSION: Mild increased renal echogenicity, a finding consistent with medical renal disease. Renal cortical thickness normal. No obstructing foci identified. Study otherwise unremarkable except for incidental note of right  pleural effusion. Electronically Signed   By: Lowella Grip III M.D.   On: 05/13/2015 21:07   Dg Chest Port 1 View  05/14/2015  CLINICAL DATA:  Left IJ line placement.  Initial encounter. EXAM: PORTABLE CHEST 1 VIEW COMPARISON:  Chest radiograph performed 05/13/2015 FINDINGS: The patient's endotracheal tube is seen ending 4 cm above the carina. A right IJ line is noted ending about the mid SVC. A left IJ line is noted ending about the mid to distal SVC. An enteric tube is noted extending below the diaphragm. Mild vascular congestion is noted. No focal consolidation, pleural effusion or pneumothorax is seen. The cardiomediastinal silhouette is mildly enlarged. No acute osseous abnormalities are identified. IMPRESSION: 1. Endotracheal tube seen ending 4 cm above the carina. 2. Left IJ line noted ending about the mid to distal SVC. 3. Mild vascular congestion and mild cardiomegaly. Lungs remain grossly clear. Electronically Signed   By: Garald Balding M.D.   On: 05/14/2015 06:55   Portable Chest Xray  05/13/2015  CLINICAL DATA:  Endotracheal tube placement.  Initial encounter. EXAM: PORTABLE CHEST 1 VIEW COMPARISON:  Chest radiograph performed earlier today at 6:00 p.m. FINDINGS: The patient's endotracheal tube is seen ending 2 cm above the carina. A right IJ line is noted ending about the mid SVC. An enteric tube is noted extending below the diaphragm. Minimal bilateral atelectasis is noted. The lungs are otherwise clear. No pleural effusion or pneumothorax is seen. The cardiomediastinal  silhouette is enlarged. No acute osseous abnormalities are identified. IMPRESSION: 1. Endotracheal tube seen ending 2 cm above the carina. 2. Minimal bilateral atelectasis noted.  Lungs otherwise clear. 3. Cardiomegaly. Electronically Signed   By: Garald Balding M.D.   On: 05/13/2015 23:15   Dg Chest Port 1 View  05/13/2015  CLINICAL DATA:  Acute renal failure and congestive heart failure. Central line placement. EXAM: PORTABLE CHEST 1 VIEW COMPARISON:  05/13/2015 FINDINGS: A new right jugular central venous catheter is seen with tip in the mid SVC. No evidence of pneumothorax. Mild cardiomegaly again noted.  Both lungs are clear. IMPRESSION: New right jugular central venous catheter in appropriate position. No evidence of pneumothorax or other acute findings. Electronically Signed   By: Earle Gell M.D.   On: 05/13/2015 18:17   Dg Chest Port 1 View  05/13/2015  CLINICAL DATA:  59 year old male with sepsis. Shortness of breath and weakness. Initial encounter. EXAM: PORTABLE CHEST 1 VIEW COMPARISON:  Portable AP semi upright view at 1432 hours. 08/06/2014 and earlier. FINDINGS: Portable AP semi upright view at 1432 hours. Stable cardiomegaly and mediastinal contours. Stable lung volumes. Allowing for portable technique, the lungs are clear. No pneumothorax or pleural effusion. IMPRESSION: No acute cardiopulmonary abnormality. Electronically Signed   By: Genevie Ann M.D.   On: 05/13/2015 14:53   Dg Abd Portable 2v  05/13/2015  CLINICAL DATA:  Vomiting and generalized abdominal pain. EXAM: PORTABLE ABDOMEN - 2 VIEW COMPARISON:  CT 05/08/2015 FINDINGS: Negative for free air on the left lateral decubitus image. Gaseous distention of stomach with an air-fluid level. There is evidence for gas and stool in the colon. Nonobstructive bowel gas pattern. IMPRESSION: Gaseous distension of the stomach, otherwise, unremarkable abdominal examination. Electronically Signed   By: Markus Daft M.D.   On: 05/13/2015 16:42     Assessment/Plan:  1) GNR sepsis - noted in blood cultures, also with E coli in bone marrow; may be related/disseminated infection. On imipenem.  Source unknown, possibly GI source.  Getting  CT C/A/P today.      2) eosinophilia - unknown etiology.  Was present prior to zosyn administration. Also IgE very elevated.  Normal today.  DDx includes hypereosinophilic syndrome, Erick Alley, Strongyloides with hypersensitivity reaction due to yet undiagnosed hematologic malignancy.  O and P ordered, Ab pending.    3) renal failure - acute.  Related to sepsis, underlying etiology.    4) transaminitis - also present on admission and from previous ED visit.  Worsening.  Will recheck LFTs tomorrow.   Scharlene Gloss, [facility]ious [facility] [place] [email or web address] [phone] pager   [phone] cell 05/14/2015, 1:58 PM

## 2015-05-14 NOTE — Progress Notes (Signed)
CRITICAL VALUE ALERT  Critical value received:  Gram (-) rods anaerobic bottle   Date of notification:  05/14/15  Time of notification:  06:15  Critical value read back:Yes.    Nurse who received alert:  Lenox Ahr  MD notified (1st page):  Georgann Housekeeper  Time of first page:  06:15  MD notified (2nd page): N/A  Time of second page: N/A  Responding MD:  Georgann Housekeeper  Time MD responded:  06:15

## 2015-05-14 NOTE — Progress Notes (Signed)
Hypoglycemic Event  CBG: 55  Treatment: D50 IV 25 mL  Symptoms: None  Follow-up CBG: Time:19:48 CBG Result:84  Possible Reasons for Event: Unknown  Comments/MD notified:ELINK    Walden Statz D  Adult (Non-Pregnant) Hypoglycemia Protocol Treatment Guidelines  1.  RN shall initiate Hypoglycemia Protocol emergency measures immediately when:            w Routine or STAT CBG and/or a lab glucose indicates hypoglycemia (CBG < 70 mg/dl)  2.  Treat the patient according to ability to take PO's and severity of hypoglycemia.   3.  If patient is on GlucoStabilizer, follow directions provided by the [facility] for hypoglycemic events.  4.  If patient on insulin pump, follow Hypoglycemia Protocol.  If patient requires more than one treatment have patient place pump in SUSPEND and notify MD.  DO NOT leave pump in SUSPEND for greater than 30 minutes unless ordered by MD.  A.  Treatment for Mild or Moderate-Patient cooperative and able to swallow    1.  Patient taking PO's and can cooperate   a.  Give one of the following 15 gram CHO options:                           w 1 tube oral dextrose gel                           w 3-4 Glucose tablets                           w 4 oz. Juice                           w 4 oz. regular soda                                    ESRD patients:  clear, regular soda                           w 8 oz. skim milk    b.  Recheck CBG in 15 minutes after treatment                            w If CBG < 70 mg/dl, repeat treatment and recheck until hypoglycemia is resolved                            w If CBG > 70 mg/dl and next meal is more than 1 hour away, give additional 15 grams CHO   2.  Patient NPO-Patient cooperative and no altered mental status    a.  Give 25 ml of D50 IV.   b.  Recheck CBG in 15 minutes after treatment.                             w If CBG is less than 70 mg/dl, repeat treatment and recheck until hypoglycemia is resolved.   c.   Notify MD for further orders.             SPECIAL CONSIDERATIONS:    a.  If no IV access,  w Start IV of D5W at [facility]                             w Give 25 ml of D50 IV.    b.  If unable to gain IV access                             w Give Glucagon IM:    i.  1 mg if patient weighs more than 45.5 kg    ii.  0.5 mg if patient weighs less than 45.5 kg   c.  Notify MD for further orders  B.  Treatment for Severe-- Patient unconscious or unable to take PO's safely    1.  Position patient on side   2.  Give 50 ml D50 IV   3.  Recheck CBG in 15 minutes.                    w If CBG is less than 70 mg/dl, repeat treatment and recheck until hypoglycemia is resolved.   4.  Notify MD for further orders.    SPECIAL CONSIDERATIONS:    a.  If no IV access                              w Give Glucagon IM                                        i.  1 mg if patient weighs more than 45.5 kg                                       ii.  0.5 mg if patient weighs less than 45.5 kg                              w Start IV of D5W at 50 ml/hr and give 50 ml D50 IV   b.  If no IV access and active seizure                               w Call Rapid Response   c.  If unable to gain IV access, give Glucagon IM:                              w 1 mg if patient weighs more than 45.5 kg                              w 0.5 mg if patient weighs less than 45.5 kg   d.  Notify MD for further orders.  C.  Complete smart text progress note to document intervention and follow-up CBG   1.  In [facility] patient chart, click on Notes (left side of screen)   2.  Create Progress Note   3.  Click on [employer].  In the Match box type "hypo" and enter    4.  Double click on [facility] IP HYPOGLYCEMIC EVENT and enter data   5.  MD must be notified if patient is NPO or experienced severe hypoglycemia

## 2015-05-14 NOTE — Progress Notes (Signed)
[facility] Progress Note Patient Name: Gaston Harden DOB: 1955/09/10 MRN: [id number]   Date of Service  05/14/2015  HPI/Events of Note  Persistently hypoglycemic DESPITE glucose in IV   eICU Interventions  ADD STRESS hYRDOCORTISONE      Intervention Category Major Interventions: Other:  Christinia Gully 05/14/2015, 7:52 PM

## 2015-05-15 ENCOUNTER — Inpatient Hospital Stay (HOSPITAL_COMMUNITY): Payer: Medicaid Other

## 2015-05-15 LAB — BASIC METABOLIC PANEL
ANION GAP: 8 (ref 5–15)
BUN: 33 mg/dL — AB (ref 6–20)
CHLORIDE: 93 mmol/L — AB (ref 101–111)
CO2: 34 mmol/L — ABNORMAL HIGH (ref 22–32)
Calcium: 6.7 mg/dL — ABNORMAL LOW (ref 8.9–10.3)
Creatinine, Ser: 2.3 mg/dL — ABNORMAL HIGH (ref 0.61–1.24)
GFR calc Af Amer: 34 mL/min — ABNORMAL LOW (ref >60)
GFR calc non Af Amer: 29 mL/min — ABNORMAL LOW (ref >60)
Glucose, Bld: 150 mg/dL — ABNORMAL HIGH (ref 65–99)
POTASSIUM: 3.6 mmol/L (ref 3.5–5.1)
SODIUM: 135 mmol/L (ref 135–145)

## 2015-05-15 LAB — BLOOD GAS, ARTERIAL
ACID-BASE EXCESS: 6.4 mmol/L — AB (ref 0.0–2.0)
Bicarbonate: 31.4 mEq/L — ABNORMAL HIGH (ref 20.0–24.0)
DRAWN BY: 308601
FIO2: 0.4
LHR: 14 {breaths}/min
O2 SAT: 99.5 %
PEEP/CPAP: 5 cmH2O
PH ART: 7.433 (ref 7.350–7.450)
Patient temperature: 36.1
TCO2: 28.7 mmol/L (ref 0–100)
VT: 550 mL
pCO2 arterial: 47.3 mmHg — ABNORMAL HIGH (ref 35.0–45.0)
pO2, Arterial: 156 mmHg — ABNORMAL HIGH (ref 80.0–100.0)

## 2015-05-15 LAB — DIFFERENTIAL
BASOS ABS: 0 10*3/uL (ref 0.0–0.1)
BASOS PCT: 0 %
EOS ABS: 0.1 10*3/uL (ref 0.0–0.7)
Eosinophils Relative: 1 %
LYMPHS PCT: 6 %
Lymphs Abs: 0.8 10*3/uL (ref 0.7–4.0)
MONO ABS: 0.6 10*3/uL (ref 0.1–1.0)
Monocytes Relative: 4 %
Neutro Abs: 12.6 10*3/uL — ABNORMAL HIGH (ref 1.7–7.7)
Neutrophils Relative %: 89 %

## 2015-05-15 LAB — OVA AND PARASITE EXAMINATION: OVA AND PARASITES: NONE SEEN

## 2015-05-15 LAB — PROCALCITONIN: Procalcitonin: 15.8 ng/mL

## 2015-05-15 LAB — MAGNESIUM: Magnesium: 2.1 mg/dL (ref 1.7–2.4)

## 2015-05-15 LAB — RENAL FUNCTION PANEL
ALBUMIN: 1.8 g/dL — AB (ref 3.5–5.0)
ANION GAP: 8 (ref 5–15)
Albumin: 2.1 g/dL — ABNORMAL LOW (ref 3.5–5.0)
Anion gap: 5 (ref 5–15)
BUN: 33 mg/dL — ABNORMAL HIGH (ref 6–20)
BUN: 27 mg/dL — AB (ref 6–20)
CHLORIDE: 98 mmol/L — AB (ref 101–111)
CO2: 35 mmol/L — AB (ref 22–32)
CO2: 32 mmol/L (ref 22–32)
CREATININE: 1.98 mg/dL — AB (ref 0.61–1.24)
Calcium: 6.7 mg/dL — ABNORMAL LOW (ref 8.9–10.3)
Calcium: 6.2 mg/dL — CL (ref 8.9–10.3)
Chloride: 93 mmol/L — ABNORMAL LOW (ref 101–111)
Creatinine, Ser: 2.36 mg/dL — ABNORMAL HIGH (ref 0.61–1.24)
GFR calc Af Amer: 33 mL/min — ABNORMAL LOW (ref >60)
GFR calc non Af Amer: 28 mL/min — ABNORMAL LOW (ref >60)
GFR, EST AFRICAN AMERICAN: 40 mL/min — AB (ref >60)
GFR, EST NON AFRICAN AMERICAN: 35 mL/min — AB (ref >60)
GLUCOSE: 156 mg/dL — AB (ref 65–99)
Glucose, Bld: 150 mg/dL — ABNORMAL HIGH (ref 65–99)
PHOSPHORUS: 5.2 mg/dL — AB (ref 2.5–4.6)
PHOSPHORUS: 4.3 mg/dL (ref 2.5–4.6)
POTASSIUM: 3.4 mmol/L — AB (ref 3.5–5.1)
Potassium: 3.7 mmol/L (ref 3.5–5.1)
Sodium: 136 mmol/L (ref 135–145)
Sodium: 135 mmol/L (ref 135–145)

## 2015-05-15 LAB — PHOSPHORUS: Phosphorus: 5.1 mg/dL — ABNORMAL HIGH (ref 2.5–4.6)

## 2015-05-15 LAB — CBC
HCT: 33.7 % — ABNORMAL LOW (ref 39.0–52.0)
HEMOGLOBIN: 11.5 g/dL — AB (ref 13.0–17.0)
MCH: 35.5 pg — AB (ref 26.0–34.0)
MCHC: 34.1 g/dL (ref 30.0–36.0)
MCV: 104 fL — AB (ref 78.0–100.0)
Platelets: 160 10*3/uL (ref 150–400)
RBC: 3.24 MIL/uL — AB (ref 4.22–5.81)
RDW: 17.8 % — ABNORMAL HIGH (ref 11.5–15.5)
WBC: 14.1 10*3/uL — ABNORMAL HIGH (ref 4.0–10.5)

## 2015-05-15 LAB — POCT ACTIVATED CLOTTING TIME
ACTIVATED CLOTTING TIME: 153 s
Activated Clotting Time: >1000 seconds
Activated Clotting Time: 153 seconds

## 2015-05-15 LAB — GLUCOSE, CAPILLARY
GLUCOSE-CAPILLARY: 170 mg/dL — AB (ref 65–99)
GLUCOSE-CAPILLARY: 156 mg/dL — AB (ref 65–99)
GLUCOSE-CAPILLARY: 132 mg/dL — AB (ref 65–99)
GLUCOSE-CAPILLARY: 147 mg/dL — AB (ref 65–99)
Glucose-Capillary: 101 mg/dL — ABNORMAL HIGH (ref 65–99)
Glucose-Capillary: 138 mg/dL — ABNORMAL HIGH (ref 65–99)

## 2015-05-15 LAB — HEPATIC FUNCTION PANEL
ALT: 621 U/L — AB (ref 17–63)
AST: 981 U/L — AB (ref 15–41)
Albumin: 2 g/dL — ABNORMAL LOW (ref 3.5–5.0)
Alkaline Phosphatase: 211 U/L — ABNORMAL HIGH (ref 38–126)
BILIRUBIN DIRECT: 5.5 mg/dL — AB (ref 0.1–0.5)
BILIRUBIN INDIRECT: 3.5 mg/dL — AB (ref 0.3–0.9)
TOTAL PROTEIN: 5.7 g/dL — AB (ref 6.5–8.1)
Total Bilirubin: 9 mg/dL — ABNORMAL HIGH (ref 0.3–1.2)

## 2015-05-15 LAB — LACTIC ACID, PLASMA: Lactic Acid, Venous: 3.1 mmol/L (ref 0.5–2.0)

## 2015-05-15 MED ORDER — HEPARIN BOLUS VIA INFUSION (CRRT)
1000.0000 [IU] | INTRAVENOUS | Status: DC | PRN
  Filled 2015-05-15: qty 1000

## 2015-05-15 MED ORDER — SODIUM CHLORIDE 0.9 % IV BOLUS (SEPSIS)
500.0000 mL | Freq: Once | INTRAVENOUS | Status: AC
  Administered 2015-05-15: 500 mL via INTRAVENOUS

## 2015-05-15 MED ORDER — PRISMASOL BGK 4/2.5 32-4-2.5 MEQ/L IV SOLN
INTRAVENOUS | Status: DC
  Administered 2015-05-15 – 2015-05-22 (×45): via INTRAVENOUS_CENTRAL
  Filled 2015-05-15 (×65): qty 5000

## 2015-05-15 MED ORDER — PRISMASOL BGK 4/2.5 32-4-2.5 MEQ/L IV SOLN
INTRAVENOUS | Status: DC
  Administered 2015-05-15 – 2015-05-21 (×13): via INTRAVENOUS_CENTRAL
  Filled 2015-05-15 (×18): qty 5000

## 2015-05-15 MED ORDER — PRISMASOL BGK 4/2.5 32-4-2.5 MEQ/L IV SOLN
INTRAVENOUS | Status: DC
  Administered 2015-05-15 – 2015-05-21 (×7): via INTRAVENOUS_CENTRAL
  Filled 2015-05-15 (×17): qty 5000

## 2015-05-15 MED ORDER — SODIUM CHLORIDE 0.9 % IV BOLUS (SEPSIS)
1000.0000 mL | Freq: Once | INTRAVENOUS | Status: AC
  Administered 2015-05-15: 1000 mL via INTRAVENOUS

## 2015-05-15 MED ORDER — SODIUM CHLORIDE 0.9 % IJ SOLN
250.0000 [IU]/h | INTRAMUSCULAR | Status: DC
  Administered 2015-05-15: 250 [IU]/h via INTRAVENOUS_CENTRAL
  Administered 2015-05-15 – 2015-05-16 (×2): 1000 [IU]/h via INTRAVENOUS_CENTRAL
  Administered 2015-05-16 (×2): 1300 [IU]/h via INTRAVENOUS_CENTRAL
  Administered 2015-05-16: 1000 [IU]/h via INTRAVENOUS_CENTRAL
  Administered 2015-05-16 – 2015-05-17 (×2): 1100 [IU]/h via INTRAVENOUS_CENTRAL
  Administered 2015-05-18: 900 [IU]/h via INTRAVENOUS_CENTRAL
  Administered 2015-05-18: 800 [IU]/h via INTRAVENOUS_CENTRAL
  Administered 2015-05-18: 600 [IU]/h via INTRAVENOUS_CENTRAL
  Administered 2015-05-18: 700 [IU]/h via INTRAVENOUS_CENTRAL
  Administered 2015-05-18: 1100 [IU]/h via INTRAVENOUS_CENTRAL
  Administered 2015-05-18 (×2): 950 [IU]/h via INTRAVENOUS_CENTRAL
  Administered 2015-05-18: 800 [IU]/h via INTRAVENOUS_CENTRAL
  Administered 2015-05-19 – 2015-05-20 (×3): 950 [IU]/h via INTRAVENOUS_CENTRAL
  Filled 2015-05-15 (×11): qty 2

## 2015-05-15 NOTE — Progress Notes (Signed)
Performed iSTAT ACT-K. ACT result 140 sec. Adjusted Heparin per order.

## 2015-05-15 NOTE — Progress Notes (Signed)
CRRT filted clotted at 0800. Filter replaced.

## 2015-05-15 NOTE — Progress Notes (Signed)
Name: [name] MRN: [id number] DOB: 10-02-1955    ADMISSION DATE:  05/03/2015 CONSULTATION DATE:  05/13/15  REFERRING MD :  Dr. Doyle Askew   CHIEF COMPLAINT:  Fever of unknown origin, tachypnea/tachycardia  BRIEF PATIENT DESCRIPTION: 59 y/o M admitted 10/16 with complaints of neck swelling.  He was also seen in the ER on 10/13 for nausea / vomiting, sore throat, diarrhea and rash.  He was treated for a possible allergic reaction and released.      SIGNIFICANT EVENTS  10/13  Seen in ER for N/V, sore throat, diarrhea & rash 10/16  Admit to [facility] for facial swelling, cough with frothy yellow sputum, sore throat and hives. 10/26 intubated 10/27 cvvh started  STUDIES:  10/13  RUQ [place] >> mild gallbladder wall thickening, no gallstones observed 10/16  CXR >> no acute infiltrate 10/16  CT Soft tissue Neck >> asymmetric enlargement of R submandibular gland relative to the left is non-specific but could be due to infection, inflammation or tumor.  Negative for abscess, diffuse subcutaneous edema 10/17  MRI Soft Tissue Neck >> motion degraded study, no discrete mass, grossly similar subcutaneous edema involving the bilateral submandibular spaces (nonspecific but must consider infection/cellulitis), no discrete abscess  10/21  CXR >> no acute infiltrate 10/21  CT Abd/Pelvis >> diffuse body wall edema suggestive of anasarca, mesenteric edema but no overt ascites, no acute intra-abdominal abnormalities, stable prostate gland enlargement and small hiatal hernia 10/27 >> CT chest, abd, pelvis >> Bibasilar atelectasis/ early infiltrate, anasarca.  HISTORY OF PRESENT ILLNESS:  59 y/o M, homeless male (lives in a hotel) with PMH of HTN, NSTEMI, HLD, arthritis, anxiety / depression, former polysubstance abuse with cardiomyopathy (EF20%), CKD IV and medical non-compliance who presented to [facility] on 10/16 with complaints of neck/face swelling with hives.    He was seen previously in the ER on 10/13 for nausea  / vomiting, sore throat, diarrhea and rash.  The patient was treated for a possible allergic reaction and released.  He had a RUQ [place] that showed contracted gallbladder with thickening likely secondary to this. It is no right upper quadrant fluid and no signs of biliary ductal dilatation  On 10/16, he initially reported facial swelling, cough with frothy yellow sputum, sore throat and hives.  He reported ongoing facial swelling after 10/13 discharge.  He denied difficulty breathing or swallowing.  Labs at that time - Na 131, K 4.8, Sr Cr 2.26, glucose 63, AST 299, ALT 283, bilirubin 2.0, BNP 752, Troponin 0.05, lipase 16, WBC 16.8, Hgb 17.8, and platelets 253.  Differential showed 10.4 lymph's, eosinophils 1.8, crenated RBC's and atypical lymphocytes.   Initial CXR without acute process.  Neck swelling was evaluated with a CT of the neck was evaluated which showed asymmetric enlargement of R submandibular gland relative to the left is non-specific but could be due to infection, inflammation or tumor.  Negative for abscess. Diffuse subcutaneous edema and follow up MRI recommended.  MRI of the soft tissues of neck showed no discrete mass or abscess.   UDS and alcohol level on admission were normal. Hepatitis A, B, and C serologies were nonrevealing.HIDA scan showed normal filling of the gallbladder with radiotracer, indicating cystic duct patency, which is not consistent with acute cholecystitis. Prompt radiotracer excretion into the common bile duct and small bowel, with no evidence of biliary obstruction. Normal liver radiotracer uptake and excretion.  Patient reports that he had been drinking prior to admission he has several beers daily and vodka (noted  macrocytic anemia on CBC). He reported that he had been having some epigastric and right upper quadrant pain for several days prior to admission. This was associated with nausea which tended to be worse on an empty stomach, and occasional vomiting. He had no  radiation of this pain into his back. He has been having heartburn for 5 days a week for months for which he will use Rolaids.  Follow up lab work on the 17th noted his LFTs trending down, but on the 19th he was noted to have a total bili of 3, ALT 419, AST 547, and alkaline phosphatase 155.  10/20 he was noted to have total bili 3.2, ALT 461, AST 561, alkaline phosphatase 197.    The patient has also had intermittent fever and rash.  Interestingly, WBC reduced when antibiotics were stopped.  Eosinophils rose to 4.1K on 10/25.  On 10/26, he developed tachycardia, tachypnea and fever to 101.  Current labs: WBC 8.1, hgb 9.8, MCV 107/109.  HIV negative.  Blood cultures negative to date.  To further work up fever, on 10/26 he underwent a bone marrow biopsy.  He developed fever, tachypnea and tachycardia on 10/26 and was transferred to ICU.    SUBJECTIVE:   Sedated on vent on pressors.  VITAL SIGNS: Temp:  [96.3 F (35.7 C)-100.2 F (37.9 C)] 97.2 F (36.2 C) (10/28 1000) Pulse Rate:  [64-88] 68 (10/28 1000) Resp:  [0-25] 12 (10/28 1000) BP: (66-118)/(47-82) 106/79 mmHg (10/28 1030) SpO2:  [98 %-100 %] 100 % (10/28 1000) FiO2 (%):  [30 %-100 %] 30 % (10/28 1000) Weight:  [173 lb 8 oz (78.7 kg)] 173 lb 8 oz (78.7 kg) (10/28 0320)  PHYSICAL EXAMINATION: General:  Awake, responsive on vent. Neuro:  No gross focal deficits HEENT:  OTT, no JVD, sclera icterus  Cardiovascular:  s1s2 rrr, no MRG Lungs:  Tachypnea, non-labored, lungs bilaterally clear. Abdomen:  Soft, + BS Musculoskeletal:  No acute deformities  Skin: Dry scaling rash over extremities are improving.    Recent Labs Lab 05/14/15 1130 05/14/15 1600 05/15/15 0400  NA 139 138 135  136  K 4.8 4.4 3.6  3.4*  CL 102 100* 93*  93*  CO2 27 30 34*  35*  BUN 51* 46* 33*  33*  CREATININE 3.38* 3.12* 2.30*  2.36*  GLUCOSE 81 89 150*  156*    Recent Labs Lab 05/13/15 1624 05/14/15 0415 05/15/15 0400  HGB 11.4* 10.5* 11.5*   HCT 31.3* 28.9* 33.7*  WBC 13.0* 10.4 14.1*  PLT 246 188 160   ASSESSMENT / PLAN:  PULMONARY A: Acute respiratory failure due to metabolic acidosis P:   - Vent bundle - Start daily wake up assessments and SBTs  CARDIOVASCULAR R IJ HD catheter 10/26 >>  Lt I J CVL 10/27>> A:  Relative hypotension Moderate mitral regurgitation Dilated cardiomyopathy  Hx CAD Hx HTN P:  - Volume support - Consider possible infiltrative process, myocarditis.  - Pressor support as needed - Follow lactate.  RENAL A:  Acute renal failure. ? AIN Acute metabolic acidosis due to the above and to lactic acidosis.  P:   - CVVH started 10/27 - Check urine eosniophils  GASTROINTESTINAL A:  ? EtOH liver disease CT abd and pelvis is unrevealing. P:   - Follow transaminases. - Start tube feeds.  HEMATOLOGIC A:  Peripheral eosinophilia >> resolving Maybe Churg-Strauss, although no pulmonary infiltrates and ANCA is negative. Other possibility is allergic reaction to zosyn. All work up for  vasculitis is negative IgE 5125. Bone marrow biopsy not c/w with hypereosinophilic syndrome.   P:  Follow IgE levels Check stool ova parasite. Started on steroids overnight for hypoglycemia. We will continue the same. It may help with this process as well.   INFECTIOUS A:  Fever Suspected recent submandibular cellulitis  E coli in bone marrow and GNR bacteremia. P:   BC 10/17 >> negative UC 10/17 >> negative BCx2 10/26 >> gnr>> UC 10/26 >>  Bone marrow 10/26 >> rare E coli. Stool O&P 10/26 >>  HIV 10/21 >> negative CMV 10/21 >. Negative EBV 10/21 >> negative   clinda x 1 10/17  vanco 10/17 >> 10/21  Zosyn 10/17 >> 10/21 vanco 10/26 >> 10/27 Zosyn 10/26 >> 10/26 Primaxin 10/26 >>  - Continue Primaxin.  DERMATOLOGY:  A: skin peeling rash, without clear involvement of lower layers, palpable  P: - Will need a skin bx if this worsens.  ENDOCRINE Hypoglycemia P:   Started on dextrose drip  D5 NS at 100cchr and stress dose steroids. Will continue to monitor.  NEUROLOGIC A:  No focal issues Sedated for vent tolerance P:   RASS goal: -1  FAMILY  - Updates: no family at bedside Pt intubated and sedated - Inter-disciplinary family meet or Palliative Care meeting due by:  05/18/15   TODAY'S SUMMARY:  Continues on CVVH, pressors are weaning. Failed SBTs.  Critical care time- 45 mins.  Marshell Garfinkel MD [facility] Pager [phone] If no answer or after 3pm call: [phone] 05/15/2015, 11:01 AM

## 2015-05-15 NOTE — Progress Notes (Signed)
Critical Calcium of 6.2 called to e-link and given to Arbie Cookey, RN who will pass along to Dr. Lynford Citizen.

## 2015-05-15 NOTE — Progress Notes (Signed)
[facility]ious [facility]   Date of Admission:  05/03/2015  Antibiotics: imipenem  Interval history: Remains intubated, sedated, on pressor support.  CT scan done and some patchy infiltrative changes.  Nothing on CT to suggest source of GNR.   Objective:  CONSTITUTIONAL:in no apparent distress ; intubated Filed Vitals:   05/15/15 1130  BP: 111/67  Pulse:   Temp:   Resp:   Eyes: icteric HENT: ET tube in place Cardiovascular: Tachy RR; 1+ leg edema Respiratory: diffuse rhonchi, no crackles noted GI: some distension GU: foley in place Skin: chest hyperpigmentation stable Neuro: sedated, not responsive  Review of systems not obtained due to patient factors.  Lab Results Lab Results  Component Value Date   WBC 14.1* 05/15/2015   HGB 11.5* 05/15/2015   HCT 33.7* 05/15/2015   MCV 104.0* 05/15/2015   PLT 160 05/15/2015    Lab Results  Component Value Date   CREATININE 2.36* 05/15/2015   CREATININE 2.30* 05/15/2015   BUN 33* 05/15/2015   BUN 33* 05/15/2015   NA 136 05/15/2015   NA 135 05/15/2015   K 3.4* 05/15/2015   K 3.6 05/15/2015   CL 93* 05/15/2015   CL 93* 05/15/2015   CO2 35* 05/15/2015   CO2 34* 05/15/2015    Lab Results  Component Value Date   ALT 621* 05/15/2015   AST 981* 05/15/2015   ALKPHOS 211* 05/15/2015   BILITOT 9.0* 05/15/2015      Microbiology: Recent Results (from the past 240 hour(s))  Culture, Urine     Status: None   Collection Time: 05/11/15  4:21 PM  Result Value Ref Range Status   Specimen Description URINE, CLEAN CATCH  Final   Special Requests Normal  Final   Culture   Final    NO GROWTH 2 DAYS Performed at [facility]    Report Status 05/13/2015 FINAL  Final  Tissue culture     Status: None   Collection Time: 05/12/15  9:50 AM  Result Value Ref Range Status   Specimen Description BIOPSY BONE MARROW  Final   Special Requests NONE  Final   Gram Stain   Final    MODERATE WBC PRESENT,BOTH PMN AND  MONONUCLEAR NO SQUAMOUS EPITHELIAL CELLS SEEN NO ORGANISMS SEEN Performed at [facility]    Culture   Final    FEW ESCHERICHIA COLI Performed at [facility]    Report Status 05/14/2015 FINAL  Final   Organism ID, Bacteria ESCHERICHIA COLI  Final      Susceptibility   Escherichia coli - MIC*    AMPICILLIN >=32 RESISTANT Resistant     AMPICILLIN/SULBACTAM >=32 RESISTANT Resistant     CEFAZOLIN >=64 RESISTANT Resistant     CEFEPIME <=1 SENSITIVE Sensitive     CEFTAZIDIME <=1 SENSITIVE Sensitive     CEFTRIAXONE <=1 SENSITIVE Sensitive     CIPROFLOXACIN <=0.25 SENSITIVE Sensitive     GENTAMICIN <=1 SENSITIVE Sensitive     IMIPENEM <=0.25 SENSITIVE Sensitive     PIP/TAZO 8 SENSITIVE Sensitive     TOBRAMYCIN <=1 SENSITIVE Sensitive     TRIMETH/SULFA Value in next row Sensitive      <=20 SENSITIVE(NOTE)    * FEW ESCHERICHIA COLI  Fungus culture w smear     Status: None (Preliminary result)   Collection Time: 05/12/15  9:50 AM  Result Value Ref Range Status   Specimen Description BIOPSY BONE MARROW  Final   Special Requests NONE  Final  Fungal Smear   Final    NO YEAST OR FUNGAL ELEMENTS SEEN Performed at [facility]    Culture   Final    CULTURE IN PROGRESS FOR FOUR WEEKS Performed at [facility]    Report Status PENDING  Incomplete  AFB culture with smear     Status: None (Preliminary result)   Collection Time: 05/12/15  9:50 AM  Result Value Ref Range Status   Specimen Description BIOPSY BONE MARROW  Final   Special Requests NONE  Final   Acid Fast Smear   Final    NO ACID FAST BACILLI SEEN Performed at [facility]    Culture   Final    CULTURE WILL BE EXAMINED FOR 6 WEEKS BEFORE ISSUING A FINAL REPORT Performed at [facility]    Report Status PENDING  Incomplete  MRSA PCR Screening     Status: None   Collection Time: 05/13/15  2:59 PM  Result Value Ref Range Status   MRSA by PCR NEGATIVE NEGATIVE Final     Comment:        The GeneXpert MRSA Assay (FDA approved for NASAL specimens only), is one component of a comprehensive MRSA colonization surveillance program. It is not intended to diagnose MRSA infection nor to guide or monitor treatment for MRSA infections.   Culture, blood (x 2)     Status: None (Preliminary result)   Collection Time: 05/13/15  4:05 PM  Result Value Ref Range Status   Specimen Description BLOOD RIGHT ARM  Final   Special Requests BOTTLES DRAWN AEROBIC AND ANAEROBIC  5CC  Final   Culture  Setup Time   Final    GRAM NEGATIVE RODS AEROBIC BOTTLE ONLY CRITICAL RESULT CALLED TO, READ BACK BY AND VERIFIED WITH: L FREI,RN AT [id number] 05/14/15 BY L BENFIELD    Culture   Final    GRAM NEGATIVE RODS Performed at [facility]    Report Status PENDING  Incomplete  Culture, blood (x 2)     Status: None (Preliminary result)   Collection Time: 05/13/15  4:24 PM  Result Value Ref Range Status   Specimen Description BLOOD RIGHT ARM  Final   Special Requests BOTTLES DRAWN AEROBIC AND ANAEROBIC  5CC  Final   Culture  Setup Time   Final    GRAM NEGATIVE RODS IN BOTH AEROBIC AND ANAEROBIC BOTTLES CRITICAL RESULT CALLED TO, READ BACK BY AND VERIFIED WITH: S TROTS @0600  05/14/15 MKELLY    Culture   Final    GRAM NEGATIVE RODS Performed at [facility]    Report Status PENDING  Incomplete    Studies/Results: Ct Abdomen Pelvis Wo Contrast  05/14/2015  CLINICAL DATA:  Respiratory failure and abdominal pain EXAM: CT CHEST, ABDOMEN AND PELVIS WITHOUT CONTRAST TECHNIQUE: Multidetector CT imaging of the chest, abdomen and pelvis was performed following the standard protocol without IV contrast. COMPARISON:  05/08/2015, 05/13/2015. FINDINGS: CT CHEST FINDINGS Lungs are well aerated bilaterally but demonstrate bibasilar lower lobe atelectasis/early infiltrate. Some more patchy ground-glass changes are noted throughout both lungs worse on the right than the left. This may  represent some mild edema or multifocal infectious process. Thoracic inlet shows evidence of an endotracheal tube at the level of the carina. This probably could be withdrawn 2-3 cm. A nasogastric catheter is noted coiled within the stomach. Cardiac shadow remains enlarged. No significant lymphadenopathy is identified. CT ABDOMEN AND PELVIS FINDINGS The liver and spleen are within normal limits. The gallbladder  is decompressed. The adrenal glands and pancreas are unremarkable. Kidneys demonstrate no renal calculi or obstructive changes. No gross mass lesion is seen. A vague hypodensity is noted in the medial aspect of the right kidney which is likely related to artifact as it was not seen on recent renal ultrasound. The appendix is within normal limits. No obstructive changes in the bowel are noted. The bladder is decompressed by Foley catheter. Very mild changes of anasarca are again seen stable from the prior exam. IMPRESSION: Bibasilar atelectasis/ early infiltrate. Additionally some patchy ground-glass infiltrative changes are noted within both lungs worse on the right than the left which may represent asymmetrical edema or infectious process. Endotracheal tube lies at the level of the carina and should be withdrawn 2-3 cm. The visualized abdomen and pelvis are within normal limits with the exception of some mild changes of anasarca. These results will be called to the ordering clinician or representative by the Radiologist Assistant, and communication documented in the PACS or zVision Dashboard. Electronically Signed   By: Inez Catalina M.D.   On: 05/14/2015 15:23   Ct Chest Wo Contrast  05/14/2015  CLINICAL DATA:  Respiratory failure and abdominal pain EXAM: CT CHEST, ABDOMEN AND PELVIS WITHOUT CONTRAST TECHNIQUE: Multidetector CT imaging of the chest, abdomen and pelvis was performed following the standard protocol without IV contrast. COMPARISON:  05/08/2015, 05/13/2015. FINDINGS: CT CHEST FINDINGS Lungs  are well aerated bilaterally but demonstrate bibasilar lower lobe atelectasis/early infiltrate. Some more patchy ground-glass changes are noted throughout both lungs worse on the right than the left. This may represent some mild edema or multifocal infectious process. Thoracic inlet shows evidence of an endotracheal tube at the level of the carina. This probably could be withdrawn 2-3 cm. A nasogastric catheter is noted coiled within the stomach. Cardiac shadow remains enlarged. No significant lymphadenopathy is identified. CT ABDOMEN AND PELVIS FINDINGS The liver and spleen are within normal limits. The gallbladder is decompressed. The adrenal glands and pancreas are unremarkable. Kidneys demonstrate no renal calculi or obstructive changes. No gross mass lesion is seen. A vague hypodensity is noted in the medial aspect of the right kidney which is likely related to artifact as it was not seen on recent renal ultrasound. The appendix is within normal limits. No obstructive changes in the bowel are noted. The bladder is decompressed by Foley catheter. Very mild changes of anasarca are again seen stable from the prior exam. IMPRESSION: Bibasilar atelectasis/ early infiltrate. Additionally some patchy ground-glass infiltrative changes are noted within both lungs worse on the right than the left which may represent asymmetrical edema or infectious process. Endotracheal tube lies at the level of the carina and should be withdrawn 2-3 cm. The visualized abdomen and pelvis are within normal limits with the exception of some mild changes of anasarca. These results will be called to the ordering clinician or representative by the Radiologist Assistant, and communication documented in the PACS or zVision Dashboard. Electronically Signed   By: Inez Catalina M.D.   On: 05/14/2015 15:23   US Renal  05/13/2015  CLINICAL DATA:  Chronic renal failure with questionable superimposed acute component EXAM: RENAL / URINARY TRACT  ULTRASOUND COMPLETE COMPARISON:  CT abdomen and pelvis May 08, 2015 FINDINGS: Right Kidney: Length: 11.1 cm. Echogenicity is mildly increased. Renal cortical thickness within normal limits. No mass, perinephric fluid, or hydronephrosis visualized. No sonographically demonstrable calculus or ureterectasis. Left Kidney: Length: 10.7 cm. Echogenicity is mildly increased. Renal cortical thickness within within normal limits.  No mass, perinephric fluid, or hydronephrosis visualized. No sonographically demonstrable calculus or ureterectasis. Bladder: Appears normal for degree of bladder distention. Incidental note is made of right pleural effusion.  Caryl Never IMPRESSION: Mild increased renal echogenicity, a finding consistent with medical renal disease. Renal cortical thickness normal. No obstructing foci identified. Study otherwise unremarkable except for incidental note of right pleural effusion. Electronically Signed   By: Lowella Grip III M.D.   On: 05/13/2015 21:07   Dg Chest Port 1 View  05/15/2015  CLINICAL DATA:  Respiratory failure and sepsis, intubated patient, CHF, acute and chronic renal failure EXAM: PORTABLE CHEST 1 VIEW COMPARISON:  Portable chest x-ray of May 14, 2015 FINDINGS: The lungs are well-expanded. There is persistent subsegmental atelectasis or pneumonia in the left lower lobe medially. The right lung is clear. The heart is mildly enlarged. The pulmonary vascularity is normal. The endotracheal tube measures 3.5 cm above the carina. The esophagogastric tube tip projects below the inferior margin of the image. The right internal jugular dual-lumen catheter tip projects over the junction of the proximal and mid SVC. The left internal jugular venous catheter tip projects approximately 2 cm inferior to this. IMPRESSION: Persistent left lower lobe subsegmental atelectasis or pneumonia. Stable enlargement of the cardiac silhouette without pulmonary edema. The support tubes are in reasonable  position. Electronically Signed   By: David  Martinique M.D.   On: 05/15/2015 07:05   Dg Chest Port 1 View  05/14/2015  CLINICAL DATA:  ETT replacement. EXAM: PORTABLE CHEST 1 VIEW COMPARISON:  Earlier today. FINDINGS: Endotracheal tube tip 2.6 cm above the carina. Right jugular catheter tip in the proximal superior vena cava. Left jugular catheter tip in the mid to distal superior vena cava. Nasogastric tube extending into the stomach. Stable enlarged cardiac silhouette and mildly prominent pulmonary vasculature and interstitial markings. No pleural fluid. Mild thoracic spine degenerative changes. Bilateral shoulder degenerative changes. IMPRESSION: 1. Endotracheal tube in satisfactory position. 2. Stable cardiomegaly, mild pulmonary vascular congestion and mild chronic interstitial lung disease. Electronically Signed   By: Claudie Revering M.D.   On: 05/14/2015 16:18   Dg Chest Port 1 View  05/14/2015  CLINICAL DATA:  Left IJ line placement.  Initial encounter. EXAM: PORTABLE CHEST 1 VIEW COMPARISON:  Chest radiograph performed 05/13/2015 FINDINGS: The patient's endotracheal tube is seen ending 4 cm above the carina. A right IJ line is noted ending about the mid SVC. A left IJ line is noted ending about the mid to distal SVC. An enteric tube is noted extending below the diaphragm. Mild vascular congestion is noted. No focal consolidation, pleural effusion or pneumothorax is seen. The cardiomediastinal silhouette is mildly enlarged. No acute osseous abnormalities are identified. IMPRESSION: 1. Endotracheal tube seen ending 4 cm above the carina. 2. Left IJ line noted ending about the mid to distal SVC. 3. Mild vascular congestion and mild cardiomegaly. Lungs remain grossly clear. Electronically Signed   By: Garald Balding M.D.   On: 05/14/2015 06:55   Portable Chest Xray  05/13/2015  CLINICAL DATA:  Endotracheal tube placement.  Initial encounter. EXAM: PORTABLE CHEST 1 VIEW COMPARISON:  Chest radiograph  performed earlier today at 6:00 p.m. FINDINGS: The patient's endotracheal tube is seen ending 2 cm above the carina. A right IJ line is noted ending about the mid SVC. An enteric tube is noted extending below the diaphragm. Minimal bilateral atelectasis is noted. The lungs are otherwise clear. No pleural effusion or pneumothorax is seen. The cardiomediastinal silhouette is enlarged. No  acute osseous abnormalities are identified. IMPRESSION: 1. Endotracheal tube seen ending 2 cm above the carina. 2. Minimal bilateral atelectasis noted.  Lungs otherwise clear. 3. Cardiomegaly. Electronically Signed   By: Garald Balding M.D.   On: 05/13/2015 23:15   Dg Chest Port 1 View  05/13/2015  CLINICAL DATA:  Acute renal failure and congestive heart failure. Central line placement. EXAM: PORTABLE CHEST 1 VIEW COMPARISON:  05/13/2015 FINDINGS: A new right jugular central venous catheter is seen with tip in the mid SVC. No evidence of pneumothorax. Mild cardiomegaly again noted.  Both lungs are clear. IMPRESSION: New right jugular central venous catheter in appropriate position. No evidence of pneumothorax or other acute findings. Electronically Signed   By: Earle Gell M.D.   On: 05/13/2015 18:17   Dg Chest Port 1 View  05/13/2015  CLINICAL DATA:  59 year old male with sepsis. Shortness of breath and weakness. Initial encounter. EXAM: PORTABLE CHEST 1 VIEW COMPARISON:  Portable AP semi upright view at 1432 hours. 08/06/2014 and earlier. FINDINGS: Portable AP semi upright view at 1432 hours. Stable cardiomegaly and mediastinal contours. Stable lung volumes. Allowing for portable technique, the lungs are clear. No pneumothorax or pleural effusion. IMPRESSION: No acute cardiopulmonary abnormality. Electronically Signed   By: Genevie Ann M.D.   On: 05/13/2015 14:53   Dg Abd Portable 2v  05/13/2015  CLINICAL DATA:  Vomiting and generalized abdominal pain. EXAM: PORTABLE ABDOMEN - 2 VIEW COMPARISON:  CT 05/08/2015 FINDINGS:  Negative for free air on the left lateral decubitus image. Gaseous distention of stomach with an air-fluid level. There is evidence for gas and stool in the colon. Nonobstructive bowel gas pattern. IMPRESSION: Gaseous distension of the stomach, otherwise, unremarkable abdominal examination. Electronically Signed   By: Markus Daft M.D.   On: 05/13/2015 16:42    Assessment/Plan:  1) GNR sepsis - awaiting speication.  On imipenem with concern for allergic reaction with zosyn, though eosinophilia prior to zosyn.  Will narrow once ID known.  Also with E coli in bone marrow.  On pressor support with levophed.    2) eosinophilia - unknown etiology.  Bone marrow also with notable eosinophilia.  No indication of cancer.  Chromosome analysis pending.  O and P pending.    3) renal failure - acute.  On CVVH.    4) transaminitiss - jaundice.  Exacerbated by sepsis.    Dr. Johnnye Sima to follow up over the weekend. thanks  Scharlene Gloss, [facility] [place] [email or web address] [phone] pager   [phone] cell 05/15/2015, 11:38 AM

## 2015-05-15 NOTE — Progress Notes (Signed)
[facility] Lab Review  Have not seen pt yet today but based on K of 3.6 and normal pH have changed his CRRT fluids to all 4K (pre/post filter and dialysate) and have stopped the isotonic bicarb post filter.  Will see pt later this AM.  Jamal Maes, MD [facility] [phone] Pager 05/15/2015, 7:46 AM

## 2015-05-15 NOTE — Progress Notes (Addendum)
Nutrition Follow-up  DOCUMENTATION CODES:   Not applicable  INTERVENTION:  - If pt to remain intubated >24 hours and if within POC, recommend Nepro @ 55 mL/hr with 30 mL Prostat once/day which will provide 2476 kcal, 122 grams protein, and 960 mL free water. - RD will continue to monitor for needs  NUTRITION DIAGNOSIS:   Inadequate oral intake related to inability to eat as evidenced by NPO status. -ongoing  GOAL:   Patient will meet greater than or equal to 90% of their needs -unmet  MONITOR:   Vent status, Labs, Weight trends, Skin, I & O's  REASON FOR ASSESSMENT:   Consult Assessment of nutrition requirement/status  ASSESSMENT:   59 year old male with past medical history of hypertension, chronic combined CHF, CAD, NSTEMI (on aspirin and plavix), CKD stage 3 who presented to [facility] ED with reports of neck swelling which has been there for some time but has gotten worse in past few days.   10/28 New consult received. Pt remains intubated with OGT in place and continues on CRRT. Nutrition needs re-estimated based on current vent settings and Tmax. Patient is currently intubated on ventilator support MV: 11.1 L/min Temp (24hrs), Avg:97.5 F (36.4 C), Min:96.3 F (35.7 C), Max:98.6 F (37 C)  Propofol: none ml/hr  Pt not meeting needs at this time with no nutrition support. TF recommendations outlined above. Medications reviewed. Labs reviewed; CBGs: 55-170 mg/dL, K: 3.4 mmol/L, Cl: 93 mmol/L, BUN/creatinine elevated but trending down, Ca: 8.7 mg/dL, Phos: 5.2 mg/dL, Mg: WDL, LFTs elevated, GFR: 33.  ADDENDUM: Drips: Fentanyl @ 50 mcg/hr, Levophed @ 13 mcg/min.    10/27 - Patient was seen earlier in this admission for diet education by another RD. - Pt has been eating well with most intakes at 100%.  - Currently NPO, intubated and on CVVHDF. TF recommendations provided. - Patient is currently intubated on ventilator support - MV: 10 L/min; Propofol: none - Nutrition  focused physical exam shows no sign of depletion of muscle mass or body fat.  Diet Order:  Diet NPO time specified  Skin:   Incisions/wounds to: low back, R arm, coccyx; R buttocks laceration.  Last BM:  10/28  Height:   Ht Readings from Last 1 Encounters:  05/13/15 5\' 8"  (1.727 m)    Weight:   Wt Readings from Last 1 Encounters:  05/15/15 173 lb 8 oz (78.7 kg)    Ideal Body Weight:  70 kg  BMI:  Body mass index is 26.39 kg/(m^2).  Estimated Nutritional Needs:   Kcal:  [id number]  Protein:  118-134 grams  Fluid:  1.2-1.5 L/day  EDUCATION NEEDS:   No education needs identified at this time     Jarome Matin, RD, LDN Inpatient Clinical Dietitian Pager # [phone] After hours/weekend pager # [phone]

## 2015-05-15 NOTE — Progress Notes (Signed)
Call from bedside nurse with a critical low calcium level of 6.2. Albumin level is 1.8. This brings the corrected calcium to 8 which is normal   Plan -  No EICU or intervention Especially in the setting of renal failure  ..............  I/O last 3 completed shifts: In: 5945.8 [I.V.:3683.3; Other:405; NG/GT:1035; IV Piggyback:822.5] Out: [id number] [Urine:318; Emesis/NG output:650; [id number]     Recent Labs Lab 05/14/15 0415 05/14/15 1130 05/14/15 1600 05/15/15 0400 05/15/15 1550  NA 135 139 138 135  136 135  K 6.8* 4.8 4.4 3.6  3.4* 3.7  CL 104 102 100* 93*  93* 98*  CO2 17* 27 30 34*  35* 32  GLUCOSE 89 81 89 150*  156* 150*  BUN 59* 51* 46* 33*  33* 27*  CREATININE 4.13* 3.38* 3.12* 2.30*  2.36* 1.98*  CALCIUM 7.6* 7.8* 7.1* 6.7*  6.7* 6.2*  MG 2.8*  --   --  2.1  --   PHOS 8.3* 6.5* 6.5* 5.1*  5.2* 4.3     Recent Labs Lab 05/09/15 0559 05/12/15 0516 05/13/15 1624  05/14/15 0415 05/14/15 1130 05/14/15 1600 05/15/15 0400 05/15/15 1550  AST 599* 702* 1159*  --   --   --   --  981*  --   ALT 510* 518* 722*  --   --   --   --  621*  --   ALKPHOS 241* 252* 307*  --   --   --   --  211*  --   BILITOT 3.9* 5.2* 9.0*  --   --   --   --  9.0*  --   PROT 5.3* 5.9* 7.6  --   --   --   --  5.7*  --   ALBUMIN 2.6* 2.5* 3.0*  < > 2.8* 2.6* 2.4* 2.0*  2.1* 1.8*  INR  --   --  1.74*  --   --   --   --   --   --   < > = values in this interval not displayed.

## 2015-05-15 NOTE — Progress Notes (Signed)
[facility] ROUNDING NOTE  Subjective:  Pt intubated/sedated CRRT day 2 EVENTS: Changed fluids to all 4K and stopped isotonic bicarbonate earlier today (normalization of K and pH) Norepi weaning - now at 13 Hypoglycemia - on D5 drip and stress hydrocortisone Fluid bolus for low CVP of 5  Objective Vital signs in last 24 hours: Filed Vitals:   05/15/15 1100 05/15/15 1115 05/15/15 1124 05/15/15 1130  BP: 99/69 110/76 110/76 111/67  Pulse: 70  69   Temp: 96.8 F (36 C)     TempSrc: Core (Comment)     Resp: 11  20   Height:      Weight:      SpO2: 100%  100%    Weight change: -3.5 kg (-7 lb 11.5 oz)  Intake/Output Summary (Last 24 hours) at 05/15/15 1147 Last data filed at 05/15/15 1113  Gross per 24 hour  Intake 5665.09 ml  Output   4805 ml  Net 860.09 ml   Physical Exam:  BP 111/67 mmHg  Pulse 69  Temp(Src) 96.8 F (36 C) (Core (Comment))  Resp 20  Ht 5\' 8"  (1.727 m)  Wt 78.7 kg (173 lb 8 oz)  BMI 26.39 kg/m2  SpO2 100%  Last CVP 5 Pt intubated, sedated ETT, right IJ trialysis catheter (10/27), left IJ central line, foley with brown urine small amt Anteriorly fairly clear S1S2 No S3 or rub Abd tight. + BS and unable to assess tenderness Skin exfoliation from hands, soles of feet 1+ pitting edema LE's/UE's  Labs: Basic Metabolic Panel:  Recent Labs Lab 05/13/15 0535 05/13/15 1130 05/13/15 1624 05/14/15 0415 05/14/15 1130 05/14/15 1600 05/15/15 0400  NA 138 137 138 135 139 138 135  136  K 5.6* 5.0 6.1* 6.8* 4.8 4.4 3.6  3.4*  CL 111 109 107 104 102 100* 93*  93*  CO2 19* 18* 13* 17* 27 30 34*  35*  GLUCOSE 99 115* 41* 89 81 89 150*  156*  BUN 52* 53* 55* 59* 51* 46* 33*  33*  CREATININE 3.15* 3.56* 3.78* 4.13* 3.38* 3.12* 2.30*  2.36*  CALCIUM 8.4* 8.5* 8.4* 7.6* 7.8* 7.1* 6.7*  6.7*  PHOS  --   --   --  8.3* 6.5* 6.5* 5.1*  5.2*     Recent Labs Lab 05/12/15 0516 05/13/15 1624  05/14/15 1130 05/14/15 1600  05/15/15 0400  AST 702* 1159*  --   --   --  981*  ALT 518* 722*  --   --   --  621*  ALKPHOS 252* 307*  --   --   --  211*  BILITOT 5.2* 9.0*  --   --   --  9.0*  PROT 5.9* 7.6  --   --   --  5.7*  ALBUMIN 2.5* 3.0*  < > 2.6* 2.4* 2.0*  2.1*  < > = values in this interval not displayed.   Recent Labs Lab 05/12/15 0516 05/13/15 0535 05/13/15 1624 05/14/15 0415 05/15/15 0400  WBC 15.9* 8.3 13.0* 10.4 14.1*  NEUTROABS 5.9  --  5.0 7.8* 12.6*  HGB 9.9* 9.8* 11.4* 10.5* 11.5*  HCT 27.1* 27.7* 31.3* 28.9* 33.7*  MCV 106.3* 107.8* 109.1* 111.2* 104.0*  PLT 217 195 246 188 160     Recent Labs Lab 05/14/15 2155 05/14/15 2226 05/14/15 2356 05/15/15 0423 05/15/15 0815  GLUCAP 59* 89 101* 138* 170*   ABG    Component Value Date/Time   PHART 7.433 05/15/2015 0455  PCO2ART 47.3* 05/15/2015 0455   PO2ART 156* 05/15/2015 0455   HCO3 31.4* 05/15/2015 0455   TCO2 28.7 05/15/2015 0455   ACIDBASEDEF 10.3* 05/14/2015 0020   O2SAT 99.5 05/15/2015 0455   Results for KLAUS, ELLENDER (MRN [id number]) as of 05/14/2015 11:09  Ref. Range 05/06/2015 11:34  Hep A IgM Latest Ref Range: Negative  Negative  Hepatitis B Surface Ag Latest Ref Range: Negative  Negative  Hep B C IgM Latest Ref Range: Negative  Negative  HCV Ab Latest Ref Range: 0.0-0.9 s/co ratio <0.1   Results for REXALL, MAYBANK (MRN [id number]) as of 05/14/2015 11:09  Ref. Range 05/10/2015 05:40  ANA Ab, IFA Unknown Negative  Angiotensin-Converting Enzyme Latest Ref Range: 14-82 U/L 91 (H)  CCP Antibodies IgG/IgA Latest Ref Range: 0-19 units 9  ds DNA Ab Latest Ref Range: 0-9 IU/mL 1  Rhuematoid fact SerPl-aCnc Latest Ref Range: 0.0-13.9 IU/mL <10.0  C-ANCA Latest Ref Range: Neg:<1:20 titer <1:20  P-ANCA Latest Ref Range: Neg:<1:20 titer <1:20  Atypical P-ANCA titer Latest Ref Range: Neg:<1:20 titer <1:20   Results for RYYAN, AREDONDO (MRN [id number]) as of 05/14/2015 11:09  Ref. Range 05/13/2015 19:48  IgE  (Immunoglobulin E), Serum Latest Ref Range: 0-100 IU/mL 5125 (H)  Results for IZAIAS, MICHALSKI (MRN [id number]) as of 05/14/2015 11:09  Ref. Range 05/10/2015 05:40  SSA (Ro) (ENA) Antibody, IgG Latest Ref Range: 0.0-0.9 AI <0.2  SSB (La) (ENA) Antibody, IgG Latest Ref Range: 0.0-0.9 AI <0.2  Scleroderma (Scl-70) (ENA) Antibody, IgG Latest Ref Range: 0.0-0.9 AI <0.2   10/26 Blood cultures: GNR 2/2 cultures - ID pending Bone marrow - GNR   Studies/Results: Ct Abdomen Pelvis Wo Contrast  05/14/2015  CLINICAL DATA:  Respiratory failure and abdominal pain EXAM: CT CHEST, ABDOMEN AND PELVIS WITHOUT CONTRAST TECHNIQUE: Multidetector CT imaging of the chest, abdomen and pelvis was performed following the standard protocol without IV contrast. COMPARISON:  05/08/2015, 05/13/2015. FINDINGS: CT CHEST FINDINGS Lungs are well aerated bilaterally but demonstrate bibasilar lower lobe atelectasis/early infiltrate. Some more patchy ground-glass changes are noted throughout both lungs worse on the right than the left. This may represent some mild edema or multifocal infectious process. Thoracic inlet shows evidence of an endotracheal tube at the level of the carina. This probably could be withdrawn 2-3 cm. A nasogastric catheter is noted coiled within the stomach. Cardiac shadow remains enlarged. No significant lymphadenopathy is identified. CT ABDOMEN AND PELVIS FINDINGS The liver and spleen are within normal limits. The gallbladder is decompressed. The adrenal glands and pancreas are unremarkable. Kidneys demonstrate no renal calculi or obstructive changes. No gross mass lesion is seen. A vague hypodensity is noted in the medial aspect of the right kidney which is likely related to artifact as it was not seen on recent renal ultrasound. The appendix is within normal limits. No obstructive changes in the bowel are noted. The bladder is decompressed by Foley catheter. Very mild changes of anasarca are again seen stable  from the prior exam. IMPRESSION: Bibasilar atelectasis/ early infiltrate. Additionally some patchy ground-glass infiltrative changes are noted within both lungs worse on the right than the left which may represent asymmetrical edema or infectious process. Endotracheal tube lies at the level of the carina and should be withdrawn 2-3 cm. The visualized abdomen and pelvis are within normal limits with the exception of some mild changes of anasarca. These results will be called to the ordering clinician or representative by the Radiologist Assistant, and communication documented in the PACS or zVision Dashboard.  Electronically Signed   By: Inez Catalina M.D.   On: 05/14/2015 15:23   Ct Chest Wo Contrast  05/14/2015  CLINICAL DATA:  Respiratory failure and abdominal pain EXAM: CT CHEST, ABDOMEN AND PELVIS WITHOUT CONTRAST TECHNIQUE: Multidetector CT imaging of the chest, abdomen and pelvis was performed following the standard protocol without IV contrast. COMPARISON:  05/08/2015, 05/13/2015. FINDINGS: CT CHEST FINDINGS Lungs are well aerated bilaterally but demonstrate bibasilar lower lobe atelectasis/early infiltrate. Some more patchy ground-glass changes are noted throughout both lungs worse on the right than the left. This may represent some mild edema or multifocal infectious process. Thoracic inlet shows evidence of an endotracheal tube at the level of the carina. This probably could be withdrawn 2-3 cm. A nasogastric catheter is noted coiled within the stomach. Cardiac shadow remains enlarged. No significant lymphadenopathy is identified. CT ABDOMEN AND PELVIS FINDINGS The liver and spleen are within normal limits. The gallbladder is decompressed. The adrenal glands and pancreas are unremarkable. Kidneys demonstrate no renal calculi or obstructive changes. No gross mass lesion is seen. A vague hypodensity is noted in the medial aspect of the right kidney which is likely related to artifact as it was not seen on  recent renal ultrasound. The appendix is within normal limits. No obstructive changes in the bowel are noted. The bladder is decompressed by Foley catheter. Very mild changes of anasarca are again seen stable from the prior exam. IMPRESSION: Bibasilar atelectasis/ early infiltrate. Additionally some patchy ground-glass infiltrative changes are noted within both lungs worse on the right than the left which may represent asymmetrical edema or infectious process. Endotracheal tube lies at the level of the carina and should be withdrawn 2-3 cm. The visualized abdomen and pelvis are within normal limits with the exception of some mild changes of anasarca. These results will be called to the ordering clinician or representative by the Radiologist Assistant, and communication documented in the PACS or zVision Dashboard. Electronically Signed   By: Inez Catalina M.D.   On: 05/14/2015 15:23   US Renal  05/13/2015  CLINICAL DATA:  Chronic renal failure with questionable superimposed acute component EXAM: RENAL / URINARY TRACT ULTRASOUND COMPLETE COMPARISON:  CT abdomen and pelvis May 08, 2015 FINDINGS: Right Kidney: Length: 11.1 cm. Echogenicity is mildly increased. Renal cortical thickness within normal limits. No mass, perinephric fluid, or hydronephrosis visualized. No sonographically demonstrable calculus or ureterectasis. Left Kidney: Length: 10.7 cm. Echogenicity is mildly increased. Renal cortical thickness within within normal limits. No mass, perinephric fluid, or hydronephrosis visualized. No sonographically demonstrable calculus or ureterectasis. Bladder: Appears normal for degree of bladder distention. Incidental note is made of right pleural effusion.  Caryl Never IMPRESSION: Mild increased renal echogenicity, a finding consistent with medical renal disease. Renal cortical thickness normal. No obstructing foci identified. Study otherwise unremarkable except for incidental note of right pleural effusion.  Electronically Signed   By: Lowella Grip III M.D.   On: 05/13/2015 21:07   Dg Chest Port 1 View  05/15/2015  CLINICAL DATA:  Respiratory failure and sepsis, intubated patient, CHF, acute and chronic renal failure EXAM: PORTABLE CHEST 1 VIEW COMPARISON:  Portable chest x-ray of May 14, 2015 FINDINGS: The lungs are well-expanded. There is persistent subsegmental atelectasis or pneumonia in the left lower lobe medially. The right lung is clear. The heart is mildly enlarged. The pulmonary vascularity is normal. The endotracheal tube measures 3.5 cm above the carina. The esophagogastric tube tip projects below the inferior margin of the image. The right internal  jugular dual-lumen catheter tip projects over the junction of the proximal and mid SVC. The left internal jugular venous catheter tip projects approximately 2 cm inferior to this. IMPRESSION: Persistent left lower lobe subsegmental atelectasis or pneumonia. Stable enlargement of the cardiac silhouette without pulmonary edema. The support tubes are in reasonable position. Electronically Signed   By: David  Martinique M.D.   On: 05/15/2015 07:05   Dg Chest Port 1 View  05/14/2015  CLINICAL DATA:  ETT replacement. EXAM: PORTABLE CHEST 1 VIEW COMPARISON:  Earlier today. FINDINGS: Endotracheal tube tip 2.6 cm above the carina. Right jugular catheter tip in the proximal superior vena cava. Left jugular catheter tip in the mid to distal superior vena cava. Nasogastric tube extending into the stomach. Stable enlarged cardiac silhouette and mildly prominent pulmonary vasculature and interstitial markings. No pleural fluid. Mild thoracic spine degenerative changes. Bilateral shoulder degenerative changes. IMPRESSION: 1. Endotracheal tube in satisfactory position. 2. Stable cardiomegaly, mild pulmonary vascular congestion and mild chronic interstitial lung disease. Electronically Signed   By: Claudie Revering M.D.   On: 05/14/2015 16:18   Dg Chest Port 1  View  05/14/2015  CLINICAL DATA:  Left IJ line placement.  Initial encounter. EXAM: PORTABLE CHEST 1 VIEW COMPARISON:  Chest radiograph performed 05/13/2015 FINDINGS: The patient's endotracheal tube is seen ending 4 cm above the carina. A right IJ line is noted ending about the mid SVC. A left IJ line is noted ending about the mid to distal SVC. An enteric tube is noted extending below the diaphragm. Mild vascular congestion is noted. No focal consolidation, pleural effusion or pneumothorax is seen. The cardiomediastinal silhouette is mildly enlarged. No acute osseous abnormalities are identified. IMPRESSION: 1. Endotracheal tube seen ending 4 cm above the carina. 2. Left IJ line noted ending about the mid to distal SVC. 3. Mild vascular congestion and mild cardiomegaly. Lungs remain grossly clear. Electronically Signed   By: Garald Balding M.D.   On: 05/14/2015 06:55   Portable Chest Xray  05/13/2015  CLINICAL DATA:  Endotracheal tube placement.  Initial encounter. EXAM: PORTABLE CHEST 1 VIEW COMPARISON:  Chest radiograph performed earlier today at 6:00 p.m. FINDINGS: The patient's endotracheal tube is seen ending 2 cm above the carina. A right IJ line is noted ending about the mid SVC. An enteric tube is noted extending below the diaphragm. Minimal bilateral atelectasis is noted. The lungs are otherwise clear. No pleural effusion or pneumothorax is seen. The cardiomediastinal silhouette is enlarged. No acute osseous abnormalities are identified. IMPRESSION: 1. Endotracheal tube seen ending 2 cm above the carina. 2. Minimal bilateral atelectasis noted.  Lungs otherwise clear. 3. Cardiomegaly. Electronically Signed   By: Garald Balding M.D.   On: 05/13/2015 23:15   Dg Chest Port 1 View  05/13/2015  CLINICAL DATA:  Acute renal failure and congestive heart failure. Central line placement. EXAM: PORTABLE CHEST 1 VIEW COMPARISON:  05/13/2015 FINDINGS: A new right jugular central venous catheter is seen with  tip in the mid SVC. No evidence of pneumothorax. Mild cardiomegaly again noted.  Both lungs are clear. IMPRESSION: New right jugular central venous catheter in appropriate position. No evidence of pneumothorax or other acute findings. Electronically Signed   By: Earle Gell M.D.   On: 05/13/2015 18:17   Dg Chest Port 1 View  05/13/2015  CLINICAL DATA:  59 year old male with sepsis. Shortness of breath and weakness. Initial encounter. EXAM: PORTABLE CHEST 1 VIEW COMPARISON:  Portable AP semi upright view at 1432 hours. 08/06/2014  and earlier. FINDINGS: Portable AP semi upright view at 1432 hours. Stable cardiomegaly and mediastinal contours. Stable lung volumes. Allowing for portable technique, the lungs are clear. No pneumothorax or pleural effusion. IMPRESSION: No acute cardiopulmonary abnormality. Electronically Signed   By: Genevie Ann M.D.   On: 05/13/2015 14:53   Dg Abd Portable 2v  05/13/2015  CLINICAL DATA:  Vomiting and generalized abdominal pain. EXAM: PORTABLE ABDOMEN - 2 VIEW COMPARISON:  CT 05/08/2015 FINDINGS: Negative for free air on the left lateral decubitus image. Gaseous distention of stomach with an air-fluid level. There is evidence for gas and stool in the colon. Nonobstructive bowel gas pattern. IMPRESSION: Gaseous distension of the stomach, otherwise, unremarkable abdominal examination. Electronically Signed   By: Markus Daft M.D.   On: 05/13/2015 16:42   Medications: . dextrose Stopped (05/15/15 1109)  . dextrose 5 % and 0.45% NaCl 100 mL/hr at 05/15/15 1100  . fentaNYL infusion INTRAVENOUS 50 mcg/hr (05/15/15 1100)  . norepinephrine (LEVOPHED) Adult infusion 13 mcg/min (05/15/15 1147)  . dialysis replacement fluid (prismasate) 200 mL/hr at 05/15/15 0815  . dialysis replacement fluid (prismasate) 400 mL/hr at 05/15/15 0815  . dialysate (PRISMASATE) 1,500 mL/hr at 05/15/15 0815   . antiseptic oral rinse  7 mL Mouth Rinse BID  . antiseptic oral rinse  7 mL Mouth Rinse QID  .  aspirin  81 mg Oral Daily  . clopidogrel  75 mg Oral Daily  . diphenhydrAMINE  25 mg Oral Q6H  . famotidine  20 mg Oral Daily  . fentaNYL (SUBLIMAZE) injection  50 mcg Intravenous Once  . hydrocortisone sodium succinate  100 mg Intravenous Q12H  . imipenem-cilastatin  500 mg Intravenous 4 times per day  . mirabegron ER  25 mg Oral Daily  . sodium chloride  3 mL Intravenous Q12H   BACKGROUND 59 year old African-American man with background of CKD3 (baseline creatinine 1.4-1.8), HTN, ischemic dilated CM with CHF (EF 20-25 percent), history of poor compliance/polysubstance abuse/homelessness. Presented with a convoluted recent past medical history nausea, vomiting, sore throat with diarrhea and a rash that was treated as an allergic reaction, then, admitted a few days later (on 10/16) for complaints of neck swelling/facial swelling with cough productive of frothy yellow sputum together with sore throat and hives. He is septic with GNR bacteremia (and GNR on BM bx done 10/26), has eosinophilia (uncertain etiology), abnormal LFT's, and acute renal failure in this setting, with metabolic acidosis and hyperkalemia and we were asked to see. Metabolic derangements failed conservative management and CRRT was initiated 10/27.  1. Acute renal failure (on CKD3) - oligoanuric in setting of sepsis/hypotension. Cannot entirely r/o acute interstitial nephritis but had a paucity of WBC's in urine early on. I note that urine eos have been ordered. Possibly moot point since on stress steroids which would treat if drug related (and in this case would need biopsy for diagnosis).  CRRT initiated 10/27 for acidosis and hyperkalemia - both of which are better but pt remains oliguric and on pressors. Fluids changed to all 4K dialysate and isotonic bicarb discontinued. Keeping volume even (actually required fluid BOLUS for low CVP) No heparin.  2. Hyperkalemia - Resolved. CRRT fluids changed to all 4K.   3. Metabolic acidosis -  slightly overcorrected. Off isotonic bicarb now. 4. Gram negative rod bacteremia/sepsis with GNR blood and BM smear - on primaxin. Source not known. 5. Eosinophilia - etiology not known. Only abnl serology is mildly elevated ACE level and elevated IgE.  6. Abnormal LFT's  Jamal Maes, MD [facility] [phone] pager 05/15/2015, 11:47 AM

## 2015-05-15 NOTE — Progress Notes (Signed)
CRITICAL VALUE ALERT  Critical value received:  Lactic 3.1  Date of notification:  05/15/15  Time of notification:  1215  Critical value read back:Yes.    Nurse who received alert:  Leola Brazil, RN  MD notified (1st page):  Dr. Vaughan Browner  Time of first page:  On unit  MD notified (2nd page):  Time of second page:  Responding MD:  Dr. Vaughan Browner  Time MD responded:  1245 on unit

## 2015-05-15 NOTE — Progress Notes (Signed)
CRRT filter clotted at 1755. Filter replaced.

## 2015-05-16 ENCOUNTER — Inpatient Hospital Stay (HOSPITAL_COMMUNITY): Payer: Medicaid Other

## 2015-05-16 DIAGNOSIS — J9601 Acute respiratory failure with hypoxia: Secondary | ICD-10-CM

## 2015-05-16 DIAGNOSIS — Z590 Homelessness: Secondary | ICD-10-CM

## 2015-05-16 DIAGNOSIS — I255 Ischemic cardiomyopathy: Secondary | ICD-10-CM

## 2015-05-16 DIAGNOSIS — I420 Dilated cardiomyopathy: Secondary | ICD-10-CM

## 2015-05-16 DIAGNOSIS — K759 Inflammatory liver disease, unspecified: Secondary | ICD-10-CM

## 2015-05-16 DIAGNOSIS — R221 Localized swelling, mass and lump, neck: Secondary | ICD-10-CM

## 2015-05-16 DIAGNOSIS — E43 Unspecified severe protein-calorie malnutrition: Secondary | ICD-10-CM

## 2015-05-16 DIAGNOSIS — R6521 Severe sepsis with septic shock: Secondary | ICD-10-CM

## 2015-05-16 LAB — PHOSPHORUS: PHOSPHORUS: 4.5 mg/dL (ref 2.5–4.6)

## 2015-05-16 LAB — POCT ACTIVATED CLOTTING TIME
ACTIVATED CLOTTING TIME: 165 s
ACTIVATED CLOTTING TIME: 165 s
ACTIVATED CLOTTING TIME: 171 s
ACTIVATED CLOTTING TIME: 177 s
ACTIVATED CLOTTING TIME: 177 s
ACTIVATED CLOTTING TIME: 184 s
ACTIVATED CLOTTING TIME: 184 s
ACTIVATED CLOTTING TIME: 190 s
ACTIVATED CLOTTING TIME: 196 s
ACTIVATED CLOTTING TIME: 202 s
ACTIVATED CLOTTING TIME: 202 s
ACTIVATED CLOTTING TIME: 202 s
ACTIVATED CLOTTING TIME: 233 s
Activated Clotting Time: 147 seconds
Activated Clotting Time: 177 seconds
Activated Clotting Time: 178 seconds
Activated Clotting Time: 184 seconds
Activated Clotting Time: 190 seconds
Activated Clotting Time: 190 seconds
Activated Clotting Time: 214 seconds

## 2015-05-16 LAB — CBC WITH DIFFERENTIAL/PLATELET
BASOS ABS: 0.2 10*3/uL — AB (ref 0.0–0.1)
BASOS PCT: 1 %
Eosinophils Absolute: 0 10*3/uL (ref 0.0–0.7)
Eosinophils Relative: 0 %
HCT: 30.7 % — ABNORMAL LOW (ref 39.0–52.0)
HEMOGLOBIN: 10.6 g/dL — AB (ref 13.0–17.0)
LYMPHS ABS: 1.8 10*3/uL (ref 0.7–4.0)
LYMPHS PCT: 9 %
MCH: 36.9 pg — AB (ref 26.0–34.0)
MCHC: 34.5 g/dL (ref 30.0–36.0)
MCV: 107 fL — AB (ref 78.0–100.0)
MONOS PCT: 7 %
Monocytes Absolute: 1.4 10*3/uL — ABNORMAL HIGH (ref 0.1–1.0)
NEUTROS ABS: 17 10*3/uL — AB (ref 1.7–7.7)
Neutrophils Relative %: 83 %
Platelets: 152 10*3/uL (ref 150–400)
RBC: 2.87 MIL/uL — ABNORMAL LOW (ref 4.22–5.81)
RDW: 18.1 % — ABNORMAL HIGH (ref 11.5–15.5)
WBC: 20.4 10*3/uL — ABNORMAL HIGH (ref 4.0–10.5)

## 2015-05-16 LAB — BLOOD GAS, ARTERIAL
ACID-BASE DEFICIT: 1.1 mmol/L (ref 0.0–2.0)
ACID-BASE EXCESS: 1.4 mmol/L (ref 0.0–2.0)
Bicarbonate: 25.9 mEq/L — ABNORMAL HIGH (ref 20.0–24.0)
Bicarbonate: 22.7 mEq/L (ref 20.0–24.0)
DRAWN BY: 308601
Drawn by: 103701
FIO2: 0.3
FIO2: 0.3
LHR: 14 {breaths}/min
LHR: 14 {breaths}/min
MECHVT: 550 mL
O2 SAT: 98.8 %
O2 SAT: 98.7 %
PEEP/CPAP: 5 cmH2O
PEEP: 5 cmH2O
PH ART: 7.414 (ref 7.350–7.450)
PH ART: 7.427 (ref 7.350–7.450)
PO2 ART: 127 mmHg — AB (ref 80.0–100.0)
Patient temperature: 36.1
Patient temperature: 35.9
TCO2: 23.8 mmol/L (ref 0–100)
TCO2: 20.8 mmol/L (ref 0–100)
VT: 550 mL
pCO2 arterial: 40.7 mmHg (ref 35.0–45.0)
pCO2 arterial: 34.5 mmHg — ABNORMAL LOW (ref 35.0–45.0)
pO2, Arterial: 122 mmHg — ABNORMAL HIGH (ref 80.0–100.0)

## 2015-05-16 LAB — CULTURE, BLOOD (ROUTINE X 2)

## 2015-05-16 LAB — RENAL FUNCTION PANEL
ANION GAP: 7 (ref 5–15)
Albumin: 1.8 g/dL — ABNORMAL LOW (ref 3.5–5.0)
BUN: 21 mg/dL — ABNORMAL HIGH (ref 6–20)
CALCIUM: 6.7 mg/dL — AB (ref 8.9–10.3)
CHLORIDE: 104 mmol/L (ref 101–111)
CO2: 26 mmol/L (ref 22–32)
Creatinine, Ser: 1.51 mg/dL — ABNORMAL HIGH (ref 0.61–1.24)
GFR calc Af Amer: 56 mL/min — ABNORMAL LOW (ref >60)
GFR calc non Af Amer: 48 mL/min — ABNORMAL LOW (ref >60)
GLUCOSE: 166 mg/dL — AB (ref 65–99)
POTASSIUM: 4 mmol/L (ref 3.5–5.1)
Phosphorus: 3.6 mg/dL (ref 2.5–4.6)
SODIUM: 137 mmol/L (ref 135–145)

## 2015-05-16 LAB — BASIC METABOLIC PANEL
ANION GAP: 6 (ref 5–15)
BUN: 23 mg/dL — AB (ref 6–20)
CHLORIDE: 100 mmol/L — AB (ref 101–111)
CO2: 28 mmol/L (ref 22–32)
Calcium: 6.5 mg/dL — ABNORMAL LOW (ref 8.9–10.3)
Creatinine, Ser: 1.75 mg/dL — ABNORMAL HIGH (ref 0.61–1.24)
GFR calc Af Amer: 47 mL/min — ABNORMAL LOW (ref >60)
GFR calc non Af Amer: 40 mL/min — ABNORMAL LOW (ref >60)
GLUCOSE: 158 mg/dL — AB (ref 65–99)
POTASSIUM: 4 mmol/L (ref 3.5–5.1)
Sodium: 134 mmol/L — ABNORMAL LOW (ref 135–145)

## 2015-05-16 LAB — HEPATIC FUNCTION PANEL
ALK PHOS: 170 U/L — AB (ref 38–126)
ALT: 422 U/L — AB (ref 17–63)
AST: 572 U/L — AB (ref 15–41)
Albumin: 1.8 g/dL — ABNORMAL LOW (ref 3.5–5.0)
BILIRUBIN DIRECT: 5.5 mg/dL — AB (ref 0.1–0.5)
BILIRUBIN INDIRECT: 3.9 mg/dL — AB (ref 0.3–0.9)
BILIRUBIN TOTAL: 9.4 mg/dL — AB (ref 0.3–1.2)
Total Protein: 5.2 g/dL — ABNORMAL LOW (ref 6.5–8.1)

## 2015-05-16 LAB — GLUCOSE, CAPILLARY
GLUCOSE-CAPILLARY: 135 mg/dL — AB (ref 65–99)
GLUCOSE-CAPILLARY: 132 mg/dL — AB (ref 65–99)
Glucose-Capillary: 114 mg/dL — ABNORMAL HIGH (ref 65–99)
Glucose-Capillary: 121 mg/dL — ABNORMAL HIGH (ref 65–99)
Glucose-Capillary: 134 mg/dL — ABNORMAL HIGH (ref 65–99)
Glucose-Capillary: 140 mg/dL — ABNORMAL HIGH (ref 65–99)
Glucose-Capillary: 143 mg/dL — ABNORMAL HIGH (ref 65–99)

## 2015-05-16 LAB — LACTIC ACID, PLASMA: LACTIC ACID, VENOUS: 2.7 mmol/L — AB (ref 0.5–2.0)

## 2015-05-16 LAB — MAGNESIUM: Magnesium: 2.1 mg/dL (ref 1.7–2.4)

## 2015-05-16 LAB — IGE: IgE (Immunoglobulin E), Serum: 5592 IU/mL — ABNORMAL HIGH (ref 0–100)

## 2015-05-16 LAB — STRONGYLOIDES ANTIBODY: STRONGYLOIDES AB: NEGATIVE

## 2015-05-16 LAB — APTT: aPTT: 129 seconds — ABNORMAL HIGH (ref 24–37)

## 2015-05-16 LAB — CG4 I-STAT (LACTIC ACID): Lactic Acid, Venous: 2.7 mmol/L (ref 0.5–2.0)

## 2015-05-16 LAB — PROCALCITONIN: Procalcitonin: 8.8 ng/mL

## 2015-05-16 MED ORDER — DIPHENHYDRAMINE HCL 12.5 MG/5ML PO ELIX
25.0000 mg | ORAL_SOLUTION | Freq: Four times a day (QID) | ORAL | Status: DC
  Administered 2015-05-16 – 2015-05-19 (×11): 25 mg
  Filled 2015-05-16 (×12): qty 10

## 2015-05-16 MED ORDER — SODIUM CHLORIDE 0.9 % IV SOLN
INTRAVENOUS | Status: DC | PRN
  Administered 2015-05-16: 10 mL/h via INTRA_ARTERIAL

## 2015-05-16 MED ORDER — CHLORHEXIDINE GLUCONATE 0.12% ORAL RINSE (MEDLINE KIT)
15.0000 mL | Freq: Two times a day (BID) | OROMUCOSAL | Status: DC
  Administered 2015-05-16 – 2015-05-22 (×13): 15 mL via OROMUCOSAL

## 2015-05-16 MED ORDER — ANTISEPTIC ORAL RINSE SOLUTION (CORINZ)
7.0000 mL | Freq: Four times a day (QID) | OROMUCOSAL | Status: DC
  Administered 2015-05-16 – 2015-05-22 (×22): 7 mL via OROMUCOSAL

## 2015-05-16 MED ORDER — INSULIN ASPART 100 UNIT/ML ~~LOC~~ SOLN
0.0000 [IU] | SUBCUTANEOUS | Status: DC
  Administered 2015-05-16 – 2015-05-20 (×10): 1 [IU] via SUBCUTANEOUS

## 2015-05-16 NOTE — Progress Notes (Signed)
Arterial Line ordered by Dr. Ashok Cordia. Collateral circulation checked, and pt did not have good collateral circulation on either hand. Dr. Ashok Cordia notified of this on phone. Arterial Line placement was canceled, but Dr. Ashok Cordia still wanted arterial blood drawn for blood cultures and an arterial blood gas result. RT will continue to monitor.

## 2015-05-16 NOTE — Progress Notes (Signed)
CRITICAL VALUE ALERT  Critical value received:  Lactic Acid 2.7  Date of notification:  05/16/15  Time of notification:  04:15  Critical value read back:Yes.    Nurse who received alert:  Jeriel Vivanco D. Ayline Dingus  MD notified (1st page):  ELINK  Time of first page: 04:20  MD notified (2nd page): N/A  Time of second page: N/A  Responding MD:  Warren Lacy  Time MD responded: 04:20

## 2015-05-16 NOTE — Progress Notes (Signed)
INFECTIOUS DISEASE PROGRESS NOTE  ID: [name] is a 59 y.o. male with  Principal Problem:   Sepsis due to cellulitis [facility]) Active Problems:   Homelessness   Neck swelling   Acute renal failure superimposed on stage 3 chronic kidney disease (HCC)   Chronic combined systolic (congestive) and diastolic (congestive) heart failure (HCC)   Benign essential HTN   History of non-ST elevation myocardial infarction (NSTEMI)   Hyponatremia   Abnormal LFTs   Polysubstance abuse   Noncompliance   Leukocytosis   Cellulitis of neck   Dysphagia   Elevated LFTs   Submandibular gland infection   Urticaria   Rash and nonspecific skin eruption   Dry mouth   Dyspnea   FUO (fever of unknown origin)   Acute respiratory failure (HCC)   Renal failure (ARF), acute on chronic (HCC)   Sepsis ([place])  Subjective: 59 yo M adm on 10-16 after being seen in ED on 10-13 with presumed allergic reaction (n/v, sore throat, diarrhea, rash). He was started on steroids and then returned with neck swelling, cough, sore throat and hives. He was thought to have neck cellulitis (CT did not show abscess, did show R submandibular gland swelling) and was treated with vanco/zosyn. He had fever in hospital and rash. He was also noted to have very elevated IgE.  He developed worsening renal failure, required pressors, bicarb drip and CRRT (start 10-27). By 10-27 he grew GNR from his BCx. He also required intubation on 10-27.  He was changed to primaxin on 10-27 due to concern that his eos, rash and fever were from zosyn. He had Bone Marrow Bx that grew E coli (10-25).  He underwent CT chest/abd/pelvis on 10-28 which did not reveal a source for his bacteremia.  Today is hypothermic (95.9)  BCx 10-26 E cloacae (R- ancef) 2/2.  Abtx:  Anti-infectives    Start     Dose/Rate Route Frequency Ordered Stop   05/15/15 1600  vancomycin (VANCOCIN) IVPB 1000 mg/200 mL premix  Status:  Discontinued     1,000 mg 200 mL/hr over  60 Minutes Intravenous Every 48 hours 05/13/15 1507 05/13/15 2028   05/14/15 2200  vancomycin (VANCOCIN) IVPB 1000 mg/200 mL premix  Status:  Discontinued     1,000 mg 200 mL/hr over 60 Minutes Intravenous Every 24 hours 05/14/15 0633 05/14/15 0640   05/14/15 1200  piperacillin-tazobactam (ZOSYN) IVPB 2.25 g  Status:  Discontinued     2.25 g 100 mL/hr over 30 Minutes Intravenous 4 times per day 05/14/15 0910 05/14/15 1008   05/14/15 1200  piperacillin-tazobactam (ZOSYN) IVPB 3.375 g  Status:  Discontinued     3.375 g 100 mL/hr over 30 Minutes Intravenous 4 times per day 05/14/15 1008 05/14/15 1028   05/14/15 1200  imipenem-cilastatin (PRIMAXIN) 500 mg in sodium chloride 0.9 % 100 mL IVPB     500 mg 200 mL/hr over 30 Minutes Intravenous 4 times per day 05/14/15 1028     05/13/15 2200  piperacillin-tazobactam (ZOSYN) IVPB 3.375 g  Status:  Discontinued     3.375 g 12.5 mL/hr over 240 Minutes Intravenous 3 times per day 05/13/15 1507 05/14/15 0910   05/13/15 2200  vancomycin (VANCOCIN) IVPB 1000 mg/200 mL premix  Status:  Discontinued     1,000 mg 200 mL/hr over 60 Minutes Intravenous Every 48 hours 05/13/15 2028 05/14/15 0631   05/13/15 1430  piperacillin-tazobactam (ZOSYN) IVPB 3.375 g  Status:  Discontinued     3.375 g 100 mL/hr  over 30 Minutes Intravenous  Once 05/13/15 1428 05/13/15 2028   05/13/15 1430  vancomycin (VANCOCIN) IVPB 1000 mg/200 mL premix  Status:  Discontinued     1,000 mg 200 mL/hr over 60 Minutes Intravenous  Once 05/13/15 1428 05/13/15 2028   05/07/15 0700  vancomycin (VANCOCIN) IVPB 1000 mg/200 mL premix  Status:  Discontinued     1,000 mg 200 mL/hr over 60 Minutes Intravenous Every 24 hours 05/07/15 0611 05/08/15 1438   05/04/15 1100  piperacillin-tazobactam (ZOSYN) IVPB 3.375 g  Status:  Discontinued     3.375 g 12.5 mL/hr over 240 Minutes Intravenous 3 times per day 05/04/15 1003 05/08/15 1438   05/04/15 0600  vancomycin (VANCOCIN) IVPB 750 mg/150 ml premix   Status:  Discontinued     750 mg 150 mL/hr over 60 Minutes Intravenous Every 24 hours 05/04/15 0557 05/07/15 0610   05/03/15 2200  clindamycin (CLEOCIN) IVPB 600 mg  Status:  Discontinued     600 mg 100 mL/hr over 30 Minutes Intravenous 3 times per day 05/03/15 1524 05/04/15 0225   05/03/15 1330  clindamycin (CLEOCIN) IVPB 600 mg     600 mg 100 mL/hr over 30 Minutes Intravenous  Once 05/03/15 1326 05/03/15 1457      Medications:  Scheduled: . antiseptic oral rinse  7 mL Mouth Rinse QID  . aspirin  81 mg Oral Daily  . chlorhexidine gluconate  15 mL Mouth Rinse BID  . clopidogrel  75 mg Oral Daily  . diphenhydrAMINE  25 mg Oral Q6H  . famotidine  20 mg Oral Daily  . fentaNYL (SUBLIMAZE) injection  50 mcg Intravenous Once  . hydrocortisone sodium succinate  100 mg Intravenous Q12H  . imipenem-cilastatin  500 mg Intravenous 4 times per day  . insulin aspart  0-9 Units Subcutaneous 6 times per day  . mirabegron ER  25 mg Oral Daily  . sodium chloride  3 mL Intravenous Q12H    Objective: Vital signs in last 24 hours: Temp:  [95.5 F (35.3 C)-97.7 F (36.5 C)] 95.9 F (35.5 C) (10/29 1300) Pulse Rate:  [59-74] 60 (10/29 1300) Resp:  [0-16] 12 (10/29 1300) BP: (81-124)/(43-86) 110/44 mmHg (10/29 1300) SpO2:  [100 %] 100 % (10/29 1300) FiO2 (%):  [30 %] 30 % (10/29 1300) Weight:  [80.6 kg (177 lb 11.1 oz)] 80.6 kg (177 lb 11.1 oz) (10/29 0500)   General appearance: no distress and on vent Eyes: positive findings: sclera jaundice+++ Neck: R HD line, L triple lumen  Resp: rhonchi bilaterally Cardio: regular rate and rhythm GI: normal findings: soft, non-tender and abnormal findings:  hypoactive bowel sounds Extremities: edema BLE  Lab Results  Recent Labs  05/15/15 0400 05/15/15 1550 05/16/15 0415  WBC 14.1*  --  20.4*  HGB 11.5*  --  10.6*  HCT 33.7*  --  30.7*  NA 135  136 135 134*  K 3.6  3.4* 3.7 4.0  CL 93*  93* 98* 100*  CO2 34*  35* 32 28  BUN 33*  33*  27* 23*  CREATININE 2.30*  2.36* 1.98* 1.75*   Liver Panel  Recent Labs  05/15/15 0400 05/15/15 1550 05/16/15 0415  PROT 5.7*  --  5.2*  ALBUMIN 2.0*  2.1* 1.8* 1.8*  AST 981*  --  572*  ALT 621*  --  422*  ALKPHOS 211*  --  170*  BILITOT 9.0*  --  9.4*  BILIDIR 5.5*  --  5.5*  IBILI 3.5*  --  3.9*   Sedimentation Rate No results for input(s): ESRSEDRATE in the last 72 hours. C-Reactive Protein No results for input(s): CRP in the last 72 hours.  Microbiology: Recent Results (from the past 240 hour(s))  Culture, Urine     Status: None   Collection Time: 05/11/15  4:21 PM  Result Value Ref Range Status   Specimen Description URINE, CLEAN CATCH  Final   Special Requests Normal  Final   Culture   Final    NO GROWTH 2 DAYS Performed at [facility]    Report Status 05/13/2015 FINAL  Final  Tissue culture     Status: None   Collection Time: 05/12/15  9:50 AM  Result Value Ref Range Status   Specimen Description BIOPSY BONE MARROW  Final   Special Requests NONE  Final   Gram Stain   Final    MODERATE WBC PRESENT,BOTH PMN AND MONONUCLEAR NO SQUAMOUS EPITHELIAL CELLS SEEN NO ORGANISMS SEEN Performed at [facility]    Culture   Final    FEW ESCHERICHIA COLI Performed at [facility]    Report Status 05/14/2015 FINAL  Final   Organism ID, Bacteria ESCHERICHIA COLI  Final      Susceptibility   Escherichia coli - MIC*    AMPICILLIN >=32 RESISTANT Resistant     AMPICILLIN/SULBACTAM >=32 RESISTANT Resistant     CEFAZOLIN >=64 RESISTANT Resistant     CEFEPIME <=1 SENSITIVE Sensitive     CEFTAZIDIME <=1 SENSITIVE Sensitive     CEFTRIAXONE <=1 SENSITIVE Sensitive     CIPROFLOXACIN <=0.25 SENSITIVE Sensitive     GENTAMICIN <=1 SENSITIVE Sensitive     IMIPENEM <=0.25 SENSITIVE Sensitive     PIP/TAZO 8 SENSITIVE Sensitive     TOBRAMYCIN <=1 SENSITIVE Sensitive     TRIMETH/SULFA Value in next row Sensitive      <=20 SENSITIVE(NOTE)    * FEW  ESCHERICHIA COLI  Fungus culture w smear     Status: None (Preliminary result)   Collection Time: 05/12/15  9:50 AM  Result Value Ref Range Status   Specimen Description BIOPSY BONE MARROW  Final   Special Requests NONE  Final   Fungal Smear   Final    NO YEAST OR FUNGAL ELEMENTS SEEN Performed at [facility]    Culture   Final    CULTURE IN PROGRESS FOR FOUR WEEKS Performed at [facility]    Report Status PENDING  Incomplete  AFB culture with smear     Status: None (Preliminary result)   Collection Time: 05/12/15  9:50 AM  Result Value Ref Range Status   Specimen Description BIOPSY BONE MARROW  Final   Special Requests NONE  Final   Acid Fast Smear   Final    NO ACID FAST BACILLI SEEN Performed at [facility]    Culture   Final    CULTURE WILL BE EXAMINED FOR 6 WEEKS BEFORE ISSUING A FINAL REPORT Performed at [facility]    Report Status PENDING  Incomplete  MRSA PCR Screening     Status: None   Collection Time: 05/13/15  2:59 PM  Result Value Ref Range Status   MRSA by PCR NEGATIVE NEGATIVE Final    Comment:        The GeneXpert MRSA Assay (FDA approved for NASAL specimens only), is one component of a comprehensive MRSA colonization surveillance program. It is not intended to diagnose MRSA infection nor to guide or monitor treatment for  MRSA infections.   Culture, blood (x 2)     Status: None   Collection Time: 05/13/15  4:05 PM  Result Value Ref Range Status   Specimen Description BLOOD RIGHT ARM  Final   Special Requests BOTTLES DRAWN AEROBIC AND ANAEROBIC  5CC  Final   Culture  Setup Time   Final    GRAM NEGATIVE RODS AEROBIC BOTTLE ONLY CRITICAL RESULT CALLED TO, READ BACK BY AND VERIFIED WITH: L FREI,RN AT [id number] 05/14/15 BY L BENFIELD    Culture   Final    ENTEROBACTER CLOACAE Performed at [facility]    Report Status 05/16/2015 FINAL  Final   Organism ID, Bacteria ENTEROBACTER CLOACAE  Final       Susceptibility   Enterobacter cloacae - MIC*    CEFAZOLIN >=64 RESISTANT Resistant     CEFEPIME <=1 SENSITIVE Sensitive     CEFTAZIDIME <=1 SENSITIVE Sensitive     CEFTRIAXONE <=1 SENSITIVE Sensitive     CIPROFLOXACIN <=0.25 SENSITIVE Sensitive     GENTAMICIN <=1 SENSITIVE Sensitive     IMIPENEM 1 SENSITIVE Sensitive     TRIMETH/SULFA <=20 SENSITIVE Sensitive     PIP/TAZO <=4 SENSITIVE Sensitive     * ENTEROBACTER CLOACAE  Culture, blood (x 2)     Status: None   Collection Time: 05/13/15  4:24 PM  Result Value Ref Range Status   Specimen Description BLOOD RIGHT ARM  Final   Special Requests BOTTLES DRAWN AEROBIC AND ANAEROBIC  5CC  Final   Culture  Setup Time   Final    GRAM NEGATIVE RODS IN BOTH AEROBIC AND ANAEROBIC BOTTLES CRITICAL RESULT CALLED TO, READ BACK BY AND VERIFIED WITH: S TROTS @0600  05/14/15 MKELLY    Culture   Final    ENTEROBACTER CLOACAE SUSCEPTIBILITIES PERFORMED ON PREVIOUS CULTURE WITHIN THE LAST 5 DAYS. Performed at [facility]    Report Status 05/16/2015 FINAL  Final  Ova and parasite examination     Status: None   Collection Time: 05/14/15  4:25 PM  Result Value Ref Range Status   Specimen Description STOOL  Final   Special Requests NONE  Final   Ova and parasites   Final    NO OVA OR PARASITES SEEN Performed at [facility]    Report Status 05/15/2015 FINAL  Final    Studies/Results: Ct Abdomen Pelvis Wo Contrast  05/14/2015  CLINICAL DATA:  Respiratory failure and abdominal pain EXAM: CT CHEST, ABDOMEN AND PELVIS WITHOUT CONTRAST TECHNIQUE: Multidetector CT imaging of the chest, abdomen and pelvis was performed following the standard protocol without IV contrast. COMPARISON:  05/08/2015, 05/13/2015. FINDINGS: CT CHEST FINDINGS Lungs are well aerated bilaterally but demonstrate bibasilar lower lobe atelectasis/early infiltrate. Some more patchy ground-glass changes are noted throughout both lungs worse on the right than the left.  This may represent some mild edema or multifocal infectious process. Thoracic inlet shows evidence of an endotracheal tube at the level of the carina. This probably could be withdrawn 2-3 cm. A nasogastric catheter is noted coiled within the stomach. Cardiac shadow remains enlarged. No significant lymphadenopathy is identified. CT ABDOMEN AND PELVIS FINDINGS The liver and spleen are within normal limits. The gallbladder is decompressed. The adrenal glands and pancreas are unremarkable. Kidneys demonstrate no renal calculi or obstructive changes. No gross mass lesion is seen. A vague hypodensity is noted in the medial aspect of the right kidney which is likely related to artifact as it was not seen on recent renal ultrasound.  The appendix is within normal limits. No obstructive changes in the bowel are noted. The bladder is decompressed by Foley catheter. Very mild changes of anasarca are again seen stable from the prior exam. IMPRESSION: Bibasilar atelectasis/ early infiltrate. Additionally some patchy ground-glass infiltrative changes are noted within both lungs worse on the right than the left which may represent asymmetrical edema or infectious process. Endotracheal tube lies at the level of the carina and should be withdrawn 2-3 cm. The visualized abdomen and pelvis are within normal limits with the exception of some mild changes of anasarca. These results will be called to the ordering clinician or representative by the Radiologist Assistant, and communication documented in the PACS or zVision Dashboard. Electronically Signed   By: Inez Catalina M.D.   On: 05/14/2015 15:23   Ct Chest Wo Contrast  05/14/2015  CLINICAL DATA:  Respiratory failure and abdominal pain EXAM: CT CHEST, ABDOMEN AND PELVIS WITHOUT CONTRAST TECHNIQUE: Multidetector CT imaging of the chest, abdomen and pelvis was performed following the standard protocol without IV contrast. COMPARISON:  05/08/2015, 05/13/2015. FINDINGS: CT CHEST  FINDINGS Lungs are well aerated bilaterally but demonstrate bibasilar lower lobe atelectasis/early infiltrate. Some more patchy ground-glass changes are noted throughout both lungs worse on the right than the left. This may represent some mild edema or multifocal infectious process. Thoracic inlet shows evidence of an endotracheal tube at the level of the carina. This probably could be withdrawn 2-3 cm. A nasogastric catheter is noted coiled within the stomach. Cardiac shadow remains enlarged. No significant lymphadenopathy is identified. CT ABDOMEN AND PELVIS FINDINGS The liver and spleen are within normal limits. The gallbladder is decompressed. The adrenal glands and pancreas are unremarkable. Kidneys demonstrate no renal calculi or obstructive changes. No gross mass lesion is seen. A vague hypodensity is noted in the medial aspect of the right kidney which is likely related to artifact as it was not seen on recent renal ultrasound. The appendix is within normal limits. No obstructive changes in the bowel are noted. The bladder is decompressed by Foley catheter. Very mild changes of anasarca are again seen stable from the prior exam. IMPRESSION: Bibasilar atelectasis/ early infiltrate. Additionally some patchy ground-glass infiltrative changes are noted within both lungs worse on the right than the left which may represent asymmetrical edema or infectious process. Endotracheal tube lies at the level of the carina and should be withdrawn 2-3 cm. The visualized abdomen and pelvis are within normal limits with the exception of some mild changes of anasarca. These results will be called to the ordering clinician or representative by the Radiologist Assistant, and communication documented in the PACS or zVision Dashboard. Electronically Signed   By: Inez Catalina M.D.   On: 05/14/2015 15:23   Dg Chest Port 1 View  05/16/2015  CLINICAL DATA:  Acute respiratory failure. EXAM: PORTABLE CHEST 1 VIEW COMPARISON:   05/15/2015 and 05/14/2015 FINDINGS: Endotracheal tube, NG tube, and left central venous catheter are unchanged. Right sided central venous catheter tip is in the upper superior vena cava, slightly retracted since the prior study. Slight persistent atelectasis at both lung bases posterior medially. The lungs are otherwise clear. Vascularity is normal. Chronic cardiomegaly. No acute osseous abnormality. IMPRESSION: Slight retraction of the right central venous catheter, now just above the azygos vein confluence in the superior vena cava. Persistent slight atelectasis posterior medially at the bases. Electronically Signed   By: Lorriane Shire M.D.   On: 05/16/2015 07:21   Dg Chest [facility] 1 [address]  05/15/2015  CLINICAL DATA:  Respiratory failure and sepsis, intubated patient, CHF, acute and chronic renal failure EXAM: PORTABLE CHEST 1 VIEW COMPARISON:  Portable chest x-ray of May 14, 2015 FINDINGS: The lungs are well-expanded. There is persistent subsegmental atelectasis or pneumonia in the left lower lobe medially. The right lung is clear. The heart is mildly enlarged. The pulmonary vascularity is normal. The endotracheal tube measures 3.5 cm above the carina. The esophagogastric tube tip projects below the inferior margin of the image. The right internal jugular dual-lumen catheter tip projects over the junction of the proximal and mid SVC. The left internal jugular venous catheter tip projects approximately 2 cm inferior to this. IMPRESSION: Persistent left lower lobe subsegmental atelectasis or pneumonia. Stable enlargement of the cardiac silhouette without pulmonary edema. The support tubes are in reasonable position. Electronically Signed   By: David  Martinique M.D.   On: 05/15/2015 07:05   Dg Chest Port 1 View  05/14/2015  CLINICAL DATA:  ETT replacement. EXAM: PORTABLE CHEST 1 VIEW COMPARISON:  Earlier today. FINDINGS: Endotracheal tube tip 2.6 cm above the carina. Right jugular catheter tip in the proximal  superior vena cava. Left jugular catheter tip in the mid to distal superior vena cava. Nasogastric tube extending into the stomach. Stable enlarged cardiac silhouette and mildly prominent pulmonary vasculature and interstitial markings. No pleural fluid. Mild thoracic spine degenerative changes. Bilateral shoulder degenerative changes. IMPRESSION: 1. Endotracheal tube in satisfactory position. 2. Stable cardiomegaly, mild pulmonary vascular congestion and mild chronic interstitial lung disease. Electronically Signed   By: Claudie Revering M.D.   On: 05/14/2015 16:18   US Abdomen Limited Ruq  05/16/2015  CLINICAL DATA:  Transaminitis EXAM: US ABDOMEN LIMITED - RIGHT UPPER QUADRANT COMPARISON:  CT abdomen pelvis dated 05/14/2015 FINDINGS: Gallbladder: No gallstones, gallbladder wall thickening, or pericholecystic fluid. Sonographic Murphy's sign could not be assessed due to patient responsiveness. Common bile duct: Diameter: 3 mm Liver: No focal lesion identified. Within normal limits in parenchymal echogenicity. Additional comments: Right pleural effusion. IMPRESSION: Negative right upper quadrant ultrasound. Electronically Signed   By: Julian Hy M.D.   On: 05/16/2015 11:33     Assessment/Plan: FUO Eosinophilia on Bone marrow Bx Hepatitis  Hepatitis serologies negative  Would imagine that some of this is "shock liver" as he started CRRT on 10-27.   His RUQ u/s was unrevealing. As was his CT   Neck swelling ARF on CKD 3  Will continue on CRRT  Ischemic Dilated CM (20-25%)  Homeless  Polysubstance abuse  Severe protein calorie malnutrition  Comment- Would not change his imipenem as this point.  I am not clear why he had E coli in his bone marrow and E cloacae in his BCx.  Given that he has liver dysfunction, would suggest his portal system is not normal and chance of translocation of GNR is much higher.  The etiology of his prev syndrome- ? Allergic reaction, Eos, IgE is unclear.  His stool O & P is negative.  Needs nutrition support  Total days of antibiotics: 12 imipenem ( start 10-27)         Bobby Rumpf Infectious Diseases (pager) [phone] [email or web address] 05/16/2015, 1:12 PM  LOS: 12 days

## 2015-05-16 NOTE — Progress Notes (Signed)
[name] ROUNDING NOTE  Subjective:  Pt intubated/sedated CRRT day 3 EVENTS: Changed fluids to all 4K and stopped isotonic bicarbonate earlier today (normalization of K and pH) 2 clotted filters/24 hours - heparin added to CRRT circuit Still on norepi Fluid bolus for low CVP of 5 yesterday AM - no CVP's since   Objective Vital signs in last 24 hours: Filed Vitals:   05/16/15 0400 05/16/15 0500 05/16/15 0600 05/16/15 0700  BP: 111/72  81/65 121/80  Pulse: 66 65 64 62  Temp: 97 F (36.1 C) 96.4 F (35.8 C) 95.7 F (35.4 C) 95.7 F (35.4 C)  TempSrc: Core (Comment)     Resp: 4 11 15 12   Height:      Weight:  80.6 kg (177 lb 11.1 oz)    SpO2: 100% 100% 100% 100%   Weight change: 1.9 kg (4 lb 3 oz)  Intake/Output Summary (Last 24 hours) at 05/16/15 0758 Last data filed at 05/16/15 0700  Gross per 24 hour  Intake 5373.41 ml  Output   3825 ml  Net 1548.41 ml  UOP 247 ml  Physical Exam:  BP 121/80 mmHg  Pulse 62  Temp(Src) 95.7 F (35.4 C) (Core (Comment))  Resp 12  Ht 5\' 8"  (1.727 m)  Wt 80.6 kg (177 lb 11.1 oz)  BMI 27.02 kg/m2  SpO2 100%  Last CVP 5 yesterday - none since Pt intubated, sedated ETT, right IJ trialysis catheter (10/27), left IJ central line, foley with brown urine almost 250 ml yesterday Anteriorly clear S1S2 No S3 or rub Abd  + BS and unable to assess tenderness Skin exfoliation from hands, soles of feet 1+ pitting edema LE's/UE's unchanged  Labs: Basic Metabolic Panel:  Recent Labs Lab 05/13/15 1624 05/14/15 0415 05/14/15 1130 05/14/15 1600 05/15/15 0400 05/15/15 1550 05/16/15 0415  NA 138 135 139 138 135  136 135 134*  K 6.1* 6.8* 4.8 4.4 3.6  3.4* 3.7 4.0  CL 107 104 102 100* 93*  93* 98* 100*  CO2 13* 17* 27 30 34*  35* 32 28  GLUCOSE 41* 89 81 89 150*  156* 150* 158*  BUN 55* 59* 51* 46* 33*  33* 27* 23*  CREATININE 3.78* 4.13* 3.38* 3.12* 2.30*  2.36* 1.98* 1.75*  CALCIUM 8.4* 7.6* 7.8* 7.1* 6.7*   6.7* 6.2* 6.5*  PHOS  --  8.3* 6.5* 6.5* 5.1*  5.2* 4.3 4.5     Recent Labs Lab 05/13/15 1624  05/15/15 0400 05/15/15 1550 05/16/15 0415  AST 1159*  --  981*  --  572*  ALT 722*  --  621*  --  422*  ALKPHOS 307*  --  211*  --  170*  BILITOT 9.0*  --  9.0*  --  9.4*  PROT 7.6  --  5.7*  --  5.2*  ALBUMIN 3.0*  < > 2.0*  2.1* 1.8* 1.8*  < > = values in this interval not displayed.   Recent Labs Lab 05/13/15 1624 05/14/15 0415 05/15/15 0400 05/16/15 0415  WBC 13.0* 10.4 14.1* 20.4*  NEUTROABS 5.0 7.8* 12.6* 17.0*  HGB 11.4* 10.5* 11.5* 10.6*  HCT 31.3* 28.9* 33.7* 30.7*  MCV 109.1* 111.2* 104.0* 107.0*  PLT 246 188 160 152    Recent Labs Lab 05/15/15 1221 05/15/15 1605 05/15/15 1922 05/15/15 2352 05/16/15 0347  GLUCAP 156* 132* 147* 121* 135*   ABG    Component Value Date/Time   PHART 7.414 05/16/2015 0342   PCO2ART 40.7 05/16/2015 0342  PO2ART 127* 05/16/2015 0342   HCO3 25.9* 05/16/2015 0342   TCO2 23.8 05/16/2015 0342   ACIDBASEDEF 10.3* 05/14/2015 0020   O2SAT 98.8 05/16/2015 0342   Results for Joseph, [name] (MRN [id number]) as of 05/16/2015 08:01  Ref. Range 05/13/2015 19:45 05/13/2015 23:40 05/14/2015 04:15 05/15/2015 11:10 05/16/2015 04:15  Lactic Acid, Venous Latest Ref Range: 0.5-2.0 mmol/L 10.4 (HH) 5.4 (HH) 5.6 (HH) 3.1 (HH) 2.7 [facility])   Results for Joseph, [name] (MRN [id number]) as of 05/14/2015 11:09  Ref. Range 05/06/2015 11:34  Hep A IgM Latest Ref Range: Negative  Negative  Hepatitis B Surface Ag Latest Ref Range: Negative  Negative  Hep B C IgM Latest Ref Range: Negative  Negative  HCV Ab Latest Ref Range: 0.0-0.9 s/co ratio <0.1   Results for Joseph, [name] (MRN [id number]) as of 05/14/2015 11:09  Ref. Range 05/10/2015 05:40  ANA Ab, IFA Unknown Negative  Angiotensin-Converting Enzyme Latest Ref Range: 14-82 U/L 91 (H)  CCP Antibodies IgG/IgA Latest Ref Range: 0-19 units 9  ds DNA Ab Latest Ref Range: 0-9 IU/mL 1  Rhuematoid  fact SerPl-aCnc Latest Ref Range: 0.0-13.9 IU/mL <10.0  C-ANCA Latest Ref Range: Neg:<1:20 titer <1:20  P-ANCA Latest Ref Range: Neg:<1:20 titer <1:20  Atypical P-ANCA titer Latest Ref Range: Neg:<1:20 titer <1:20   Results for Joseph, [name] (MRN [id number]) as of 05/14/2015 11:09  Ref. Range 05/13/2015 19:48  IgE (Immunoglobulin E), Serum Latest Ref Range: 0-100 IU/mL 5125 (H)  Results for Joseph, [name] (MRN [id number]) as of 05/14/2015 11:09  Ref. Range 05/10/2015 05:40  SSA (Ro) (ENA) Antibody, IgG Latest Ref Range: 0.0-0.9 AI <0.2  SSB (La) (ENA) Antibody, IgG Latest Ref Range: 0.0-0.9 AI <0.2  Scleroderma (Scl-70) (ENA) Antibody, IgG Latest Ref Range: 0.0-0.9 AI <0.2  Blood cultures 2/2 + enterobacter cloacae\ Bine marrow culture + EColi  Studies/Results: Ct Abdomen Pelvis Wo Contrast  05/14/2015  CLINICAL DATA:  Respiratory failure and abdominal pain EXAM: CT CHEST, ABDOMEN AND PELVIS WITHOUT CONTRAST TECHNIQUE: Multidetector CT imaging of the chest, abdomen and pelvis was performed following the standard protocol without IV contrast. COMPARISON:  05/08/2015, 05/13/2015. FINDINGS: CT CHEST FINDINGS Lungs are well aerated bilaterally but demonstrate bibasilar lower lobe atelectasis/early infiltrate. Some more patchy ground-glass changes are noted throughout both lungs worse on the right than the left. This may represent some mild edema or multifocal infectious process. Thoracic inlet shows evidence of an endotracheal tube at the level of the carina. This probably could be withdrawn 2-3 cm. A nasogastric catheter is noted coiled within the stomach. Cardiac shadow remains enlarged. No significant lymphadenopathy is identified. CT ABDOMEN AND PELVIS FINDINGS The liver and spleen are within normal limits. The gallbladder is decompressed. The adrenal glands and pancreas are unremarkable. Kidneys demonstrate no renal calculi or obstructive changes. No gross mass lesion is seen. A vague  hypodensity is noted in the medial aspect of the right kidney which is likely related to artifact as it was not seen on recent renal ultrasound. The appendix is within normal limits. No obstructive changes in the bowel are noted. The bladder is decompressed by Foley catheter. Very mild changes of anasarca are again seen stable from the prior exam. IMPRESSION: Bibasilar atelectasis/ early infiltrate. Additionally some patchy ground-glass infiltrative changes are noted within both lungs worse on the right than the left which may represent asymmetrical edema or infectious process. Endotracheal tube lies at the level of the carina and should be withdrawn 2-3 cm. The visualized abdomen and pelvis are within normal  limits with the exception of some mild changes of anasarca. These results will be called to the ordering clinician or representative by the Radiologist Assistant, and communication documented in the PACS or zVision Dashboard. Electronically Signed   By: Inez Catalina M.D.   On: 05/14/2015 15:23   Ct Chest Wo Contrast  05/14/2015  CLINICAL DATA:  Respiratory failure and abdominal pain EXAM: CT CHEST, ABDOMEN AND PELVIS WITHOUT CONTRAST TECHNIQUE: Multidetector CT imaging of the chest, abdomen and pelvis was performed following the standard protocol without IV contrast. COMPARISON:  05/08/2015, 05/13/2015. FINDINGS: CT CHEST FINDINGS Lungs are well aerated bilaterally but demonstrate bibasilar lower lobe atelectasis/early infiltrate. Some more patchy ground-glass changes are noted throughout both lungs worse on the right than the left. This may represent some mild edema or multifocal infectious process. Thoracic inlet shows evidence of an endotracheal tube at the level of the carina. This probably could be withdrawn 2-3 cm. A nasogastric catheter is noted coiled within the stomach. Cardiac shadow remains enlarged. No significant lymphadenopathy is identified. CT ABDOMEN AND PELVIS FINDINGS The liver and spleen  are within normal limits. The gallbladder is decompressed. The adrenal glands and pancreas are unremarkable. Kidneys demonstrate no renal calculi or obstructive changes. No gross mass lesion is seen. A vague hypodensity is noted in the medial aspect of the right kidney which is likely related to artifact as it was not seen on recent renal ultrasound. The appendix is within normal limits. No obstructive changes in the bowel are noted. The bladder is decompressed by Foley catheter. Very mild changes of anasarca are again seen stable from the prior exam. IMPRESSION: Bibasilar atelectasis/ early infiltrate. Additionally some patchy ground-glass infiltrative changes are noted within both lungs worse on the right than the left which may represent asymmetrical edema or infectious process. Endotracheal tube lies at the level of the carina and should be withdrawn 2-3 cm. The visualized abdomen and pelvis are within normal limits with the exception of some mild changes of anasarca. These results will be called to the ordering clinician or representative by the Radiologist Assistant, and communication documented in the PACS or zVision Dashboard. Electronically Signed   By: Inez Catalina M.D.   On: 05/14/2015 15:23   Dg Chest Port 1 View  05/16/2015  CLINICAL DATA:  Acute respiratory failure. EXAM: PORTABLE CHEST 1 VIEW COMPARISON:  05/15/2015 and 05/14/2015 FINDINGS: Endotracheal tube, NG tube, and left central venous catheter are unchanged. Right sided central venous catheter tip is in the upper superior vena cava, slightly retracted since the prior study. Slight persistent atelectasis at both lung bases posterior medially. The lungs are otherwise clear. Vascularity is normal. Chronic cardiomegaly. No acute osseous abnormality. IMPRESSION: Slight retraction of the right central venous catheter, now just above the azygos vein confluence in the superior vena cava. Persistent slight atelectasis posterior medially at the  bases. Electronically Signed   By: Lorriane Shire M.D.   On: 05/16/2015 07:21   Dg Chest Port 1 View  05/15/2015  CLINICAL DATA:  Respiratory failure and sepsis, intubated patient, CHF, acute and chronic renal failure EXAM: PORTABLE CHEST 1 VIEW COMPARISON:  Portable chest x-ray of May 14, 2015 FINDINGS: The lungs are well-expanded. There is persistent subsegmental atelectasis or pneumonia in the left lower lobe medially. The right lung is clear. The heart is mildly enlarged. The pulmonary vascularity is normal. The endotracheal tube measures 3.5 cm above the carina. The esophagogastric tube tip projects below the inferior margin of the image. The right  internal jugular dual-lumen catheter tip projects over the junction of the proximal and mid SVC. The left internal jugular venous catheter tip projects approximately 2 cm inferior to this. IMPRESSION: Persistent left lower lobe subsegmental atelectasis or pneumonia. Stable enlargement of the cardiac silhouette without pulmonary edema. The support tubes are in reasonable position. Electronically Signed   By: David  Martinique M.D.   On: 05/15/2015 07:05   Dg Chest Port 1 View  05/14/2015  CLINICAL DATA:  ETT replacement. EXAM: PORTABLE CHEST 1 VIEW COMPARISON:  Earlier today. FINDINGS: Endotracheal tube tip 2.6 cm above the carina. Right jugular catheter tip in the proximal superior vena cava. Left jugular catheter tip in the mid to distal superior vena cava. Nasogastric tube extending into the stomach. Stable enlarged cardiac silhouette and mildly prominent pulmonary vasculature and interstitial markings. No pleural fluid. Mild thoracic spine degenerative changes. Bilateral shoulder degenerative changes. IMPRESSION: 1. Endotracheal tube in satisfactory position. 2. Stable cardiomegaly, mild pulmonary vascular congestion and mild chronic interstitial lung disease. Electronically Signed   By: Claudie Revering M.D.   On: 05/14/2015 16:18   Medications: . dextrose  Stopped (05/15/15 1109)  . dextrose 5 % and 0.45% NaCl 1,000 mL (05/16/15 0640)  . fentaNYL infusion INTRAVENOUS Stopped (05/16/15 0750)  . heparin 10,000 units/ 20 mL infusion syringe 1,150 Units/hr (05/16/15 0734)  . norepinephrine (LEVOPHED) Adult infusion 12 mcg/min (05/16/15 0750)  . dialysis replacement fluid (prismasate) 200 mL/hr at 05/15/15 0815  . dialysis replacement fluid (prismasate) 400 mL/hr at 05/15/15 2147  . dialysate (PRISMASATE) 1,500 mL/hr at 05/16/15 0558   . antiseptic oral rinse  7 mL Mouth Rinse BID  . antiseptic oral rinse  7 mL Mouth Rinse QID  . aspirin  81 mg Oral Daily  . clopidogrel  75 mg Oral Daily  . diphenhydrAMINE  25 mg Oral Q6H  . famotidine  20 mg Oral Daily  . fentaNYL (SUBLIMAZE) injection  50 mcg Intravenous Once  . hydrocortisone sodium succinate  100 mg Intravenous Q12H  . imipenem-cilastatin  500 mg Intravenous 4 times per day  . insulin aspart  0-9 Units Subcutaneous 6 times per day  . mirabegron ER  25 mg Oral Daily  . sodium chloride  3 mL Intravenous Q12H   BACKGROUND 59 year old African-American man with background of CKD3 (baseline creatinine 1.4-1.8), HTN, ischemic dilated CM with CHF (EF 20-25 percent), history of poor compliance/polysubstance abuse/homelessness. Presented with a convoluted recent past medical history nausea, vomiting, sore throat with diarrhea and a rash that was treated as an allergic reaction, then, admitted a few days later (on 10/16) for complaints of neck swelling/facial swelling with cough productive of frothy yellow sputum together with sore throat and hives. He is septic with GNR bacteremia (and GNR on BM bx done 10/26), has eosinophilia (uncertain etiology), abnormal LFT's, and acute renal failure in this setting, with metabolic acidosis and hyperkalemia and we were asked to see. Metabolic derangements failed conservative management and CRRT was initiated 10/27.  1. Acute renal failure (on CKD3) - oligoanuric in  setting of sepsis/hypotension. Remains on pressors. Cannot entirely r/o acute interstitial nephritis but had a paucity of WBC's in urine early on. I note that urine eos have been ordered - appears that cytology was done on the urine but not eosinophils. Possibly moot point since on stress steroids which would treat if drug related (and in this case would need biopsy for diagnosis).  CRRT initiated 10/27 for acidosis and hyperkalemia - both of which are better  and although oliguric and on pressors is now making some urine. Heparin added to CRRT circuit d/t clotting of filters yesterday. K and phos are fine, acid base stable on all 4K fluids.  Continue to keep even on current prescription. 2. Hyperkalemia - Resolved. CRRT fluids changed to all 4K and potassium stable.   3. Metabolic acidosis - Corrected. Off isotonicbicarb. Lactic acid slowly falling 4. Gram negative rod bacteremia/sepsis with enterobacter in blood cultures, EColi in the bone marrow. ID following. On primaxin Source remains known. 5. Eosinophilia - etiology not known. Only abnl serology is mildly elevated ACE level and elevated IgE.  6. Abnormal LFT's  - improving   Jamal Maes, MD [facility] [phone] pager 05/16/2015, 7:58 AM

## 2015-05-16 NOTE — Progress Notes (Signed)
Name: [name] MRN: [id number] DOB: 1956/07/14    ADMISSION DATE:  05/03/2015 CONSULTATION DATE:  05/13/15  REFERRING MD :  Dr. Doyle Askew   CHIEF COMPLAINT:  Fever of unknown origin, tachypnea/tachycardia  BRIEF PATIENT DESCRIPTION: 59 y/o M admitted 10/16 with complaints of neck swelling.  He was also seen in the ER on 10/13 for nausea / vomiting, sore throat, diarrhea and rash.  He was treated for a possible allergic reaction and released.      SIGNIFICANT EVENTS  10/13  Seen in ER for N/V, sore throat, diarrhea & rash 10/16  Admit to [facility] for facial swelling, cough with frothy yellow sputum, sore throat and hives. 10/26 intubated 10/27 cvvh started  STUDIES:  10/13  RUQ [place] - mild gallbladder wall thickening, no gallstones observed 10/16  CXR - no acute infiltrate 10/16  CT Soft tissue Neck - asymmetric enlargement of R submandibular gland relative to the left is non-specific but could be due to infection, inflammation or tumor.  Negative for abscess, diffuse subcutaneous edema 10/17  MRI Soft Tissue Neck - motion degraded study, no discrete mass, grossly similar subcutaneous edema involving the bilateral submandibular spaces (nonspecific but must consider infection/cellulitis), no discrete abscess  10/21  CXR - no acute infiltrate 10/21  CT Abd/Pelvis - diffuse body wall edema suggestive of anasarca, mesenteric edema but no overt ascites, no acute intra-abdominal abnormalities, stable prostate gland enlargement and small hiatal hernia 10/26 TTE - LV & RV moderately dilated. EF 25-30% w/ diffuse hypokinesis of LV. Moderate MR. No pericardial effusion. 10/26 Renal US - Medical renal disease. Right pleural effusion. No hydronephrosis. 10/27 CT chest, abd, pelvis - Bibasilar atelectasis/ early infiltrate, anasarca.  HISTORY OF PRESENT ILLNESS:  59 y/o M, homeless male (lives in a hotel) with PMH of HTN, NSTEMI, HLD, arthritis, anxiety / depression, former polysubstance abuse with  cardiomyopathy (EF20%), CKD IV and medical non-compliance who presented to [name] on 10/16 with complaints of neck/face swelling with hives.    He was seen previously in the ER on 10/13 for nausea / vomiting, sore throat, diarrhea and rash.  The patient was treated for a possible allergic reaction and released.  He had a RUQ [place] that showed contracted gallbladder with thickening likely secondary to this. It is no right upper quadrant fluid and no signs of biliary ductal dilatation  On 10/16, he initially reported facial swelling, cough with frothy yellow sputum, sore throat and hives.  He reported ongoing facial swelling after 10/13 discharge.  He denied difficulty breathing or swallowing.  Labs at that time - Na 131, K 4.8, Sr Cr 2.26, glucose 63, AST 299, ALT 283, bilirubin 2.0, BNP 752, Troponin 0.05, lipase 16, WBC 16.8, Hgb 17.8, and platelets 253.  Differential showed 10.4 lymph's, eosinophils 1.8, crenated RBC's and atypical lymphocytes.   Initial CXR without acute process.  Neck swelling was evaluated with a CT of the neck was evaluated which showed asymmetric enlargement of R submandibular gland relative to the left is non-specific but could be due to infection, inflammation or tumor.  Negative for abscess. Diffuse subcutaneous edema and follow up MRI recommended.  MRI of the soft tissues of neck showed no discrete mass or abscess.   UDS and alcohol level on admission were normal. Hepatitis A, B, and C serologies were nonrevealing.HIDA scan showed normal filling of the gallbladder with radiotracer, indicating cystic duct patency, which is not consistent with acute cholecystitis. Prompt radiotracer excretion into the common bile duct and small bowel,  with no evidence of biliary obstruction. Normal liver radiotracer uptake and excretion.  Patient reports that he had been drinking prior to admission he has several beers daily and vodka (noted macrocytic anemia on CBC). He reported that he had been having  some epigastric and right upper quadrant pain for several days prior to admission. This was associated with nausea which tended to be worse on an empty stomach, and occasional vomiting. He had no radiation of this pain into his back. He has been having heartburn for 5 days a week for months for which he will use Rolaids.  Follow up lab work on the 17th noted his LFTs trending down, but on the 19th he was noted to have a total bili of 3, ALT 419, AST 547, and alkaline phosphatase 155.  10/20 he was noted to have total bili 3.2, ALT 461, AST 561, alkaline phosphatase 197.    The patient has also had intermittent fever and rash.  Interestingly, WBC reduced when antibiotics were stopped.  Eosinophils rose to 4.1K on 10/25.  On 10/26, he developed tachycardia, tachypnea and fever to 101.  Current labs: WBC 8.1, hgb 9.8, MCV 107/109.  HIV negative.  Blood cultures negative to date.  To further work up fever, on 10/26 he underwent a bone marrow biopsy.  He developed fever, tachypnea and tachycardia on 10/26 and was transferred to ICU.    SUBJECTIVE:  No acute events overnight. Patient did follow commands on sedation vacation this morning.  REVIEW OF SYSTEMS:  Unobtainable as patient is intubated & sedated.  VITAL SIGNS: Temp:  [95.7 F (35.4 C)-97.7 F (36.5 C)] 96.3 F (35.7 C) (10/29 0915) Pulse Rate:  [61-74] 62 (10/29 0915) Resp:  [0-20] 14 (10/29 0915) BP: (62-121)/(42-85) 104/62 mmHg (10/29 0915) SpO2:  [100 %] 100 % (10/29 0915) FiO2 (%):  [30 %] 30 % (10/29 0800) Weight:  [177 lb 11.1 oz (80.6 kg)] 177 lb 11.1 oz (80.6 kg) (10/29 0500)  PHYSICAL EXAMINATION: General:  Awake. No acute distress. Currently on CVVHD. Integument:  Warm & dry. Flaking epidermal rash on extremities with exposed skin. HEENT:  Endotracheal tube in place. No scleral injection or icterus Cardiovascular:  Regular rhythm. Anasarca. No appreciable JVD given body habitus.  Pulmonary:  Coarse breath sounds bilaterally.  Symmetric chest wall rise on ventilator. Abdomen: Soft. Hypoactive bowel sounds. Protuberant. Musculoskeletal:  No joint deformity or effusion appreciated. Neurological: Patient moving all 4 extremities equally. Following commands in all 4 extremities on sedation vacation.   Recent Labs Lab 05/15/15 0400 05/15/15 1550 05/16/15 0415  NA 135  136 135 134*  K 3.6  3.4* 3.7 4.0  CL 93*  93* 98* 100*  CO2 34*  35* 32 28  BUN 33*  33* 27* 23*  CREATININE 2.30*  2.36* 1.98* 1.75*  GLUCOSE 150*  156* 150* 158*    Recent Labs Lab 05/14/15 0415 05/15/15 0400 05/16/15 0415  HGB 10.5* 11.5* 10.6*  HCT 28.9* 33.7* 30.7*  WBC 10.4 14.1* 20.4*  PLT 188 160 152   ASSESSMENT / PLAN:  PULMONARY A:  Acute Hypoxic Respiratory Failure - Multifactorial in setting of septic shock & cardiomyopathy.  P:   Vent bundle 8 cc/kg tidal volume Holding on sedation vacation & SBT pending improvement in hemodynamics  CARDIOVASCULAR R IJ HD catheter 10/26 >>  Lt I J CVL 10/27>> A:   Septic Shock Moderate mitral regurgitation Dilated cardiomyopathy  H/O CAD H/O HTN  P:  Continuing Levo gtt to maintain MAP  Monitoring on Telemetry  RENAL A:   Acute renal failure - On CVVHD. Lactic Acidosis - Improving. Hyperkalemia - Resolved.   P:   Awaiting Urine Eos. Continuing CVVHD. Electrolyte monitoring per Nephrology. Trending Lactic Acidosis daily  GASTROINTESTINAL A:   Shock Liver - Improving transaminitis. CT abd/pelvis neg. Questionable EtOH Liver Disease  P:   Holding on tube feedings. Trending transaminases. RUQ U/S  HEMATOLOGIC A:   Peripheral eosinophilia - Resolved. Bone marrow biopsy not c/w with hypereosinophilic syndrome. IgE 5125. Possible Churg-Strauss - although no pulmonary infiltrates and ANCA is negative. Possible Zosyn Allergy  P:  Follow IgE levels Continuing Solu-Cortef IV q12hr  INFECTIOUS A:   Septic Shock Enterococcus Bacteremia E coli  Osteomyelitis - from bone marrow biopsy  P:   Repeat Blood Cultures Today (10/29) TTE 10/26 without evidence of endocarditis  [name] 10/29 >>> BC 10/26 - Enteroccus x2 bottles BC 10/17 - negative  UC 10/26 - negative UC 10/17 - negative  Bone marrow 10/26 - E coli.  Stool Culture 10/26>>>pending Stool O&P 10/26 - negative  HIV 10/21 - negative CMV 10/21 - Negative EBV 10/21 - negative   Primaxin 10/26 >>  Vanco 10/26 - 10/27 Zosyn 10/26 - 10/26 Clinda x 1 10/17  Vanco 10/17 - 10/21  Zosyn 10/17 - 10/21  DERMATOLOGY:  A:  Rash w/ Dermal Peeling - Unclear etiology.   P: Consider skin biopsy for progression. Solu-Cortef 191m IV q12hr  ENDOCRINE A: Hypoglycemia  P:   Started on dextrose drip D5 NS at 100cchr and stress dose steroids. Continuing Accuchecks q4hr  NEUROLOGIC A:  No focal issues Sedated for vent tolerance  P:   RASS goal: -1 Fentanyl gtt Versed IV prn  FAMILY  - Updates: No family at bedside today.  - Inter-disciplinary family meet or Palliative Care meeting due by:  05/18/15   TODAY'S SUMMARY: 59year old complex male with septic shock secondary to Escherichia coli osteomyelitis as well as enterococcus bacteremia. I am repeating blood cultures today. Patient continuing on Primaxin. Eosinophilia has resolved. Continuing patient on Solu-Cortef every 12 hours. Holding on further sedation vacations and spontaneous breathing trials pending improvement in hemodynamic status. Patient continuing diuresis with the assistance of CVVHD. Lactic acidosis is improving marginally with CVVHD. I am investigating the patient's probable shock liver with a right upper quadrant ultrasound today.  Total critical care time spent caring for the patient and reviewing the patient's electronic medical records was 33 minutes today.  JSonia BallerNAshok Cordia M.D. LSouth Shore HospitalPulmonary & Critical Care Pager:  [phone] 3pm or if no response, call [phone] 05/16/2015,  9:49 AM

## 2015-05-17 DIAGNOSIS — Z978 Presence of other specified devices: Secondary | ICD-10-CM

## 2015-05-17 DIAGNOSIS — A4151 Sepsis due to Escherichia coli [E. coli]: Secondary | ICD-10-CM

## 2015-05-17 LAB — POCT ACTIVATED CLOTTING TIME
ACTIVATED CLOTTING TIME: 208 s
ACTIVATED CLOTTING TIME: 208 s
ACTIVATED CLOTTING TIME: 214 s
ACTIVATED CLOTTING TIME: 220 s
ACTIVATED CLOTTING TIME: 220 s
ACTIVATED CLOTTING TIME: 232 s
Activated Clotting Time: 233 seconds
Activated Clotting Time: 221 seconds
Activated Clotting Time: 214 seconds
Activated Clotting Time: 202 seconds

## 2015-05-17 LAB — CBC WITH DIFFERENTIAL/PLATELET
BASOS ABS: 0.2 10*3/uL — AB (ref 0.0–0.1)
Basophils Relative: 1 %
EOS ABS: 0 10*3/uL (ref 0.0–0.7)
EOS PCT: 0 %
HEMATOCRIT: 27.6 % — AB (ref 39.0–52.0)
Hemoglobin: 9.4 g/dL — ABNORMAL LOW (ref 13.0–17.0)
LYMPHS ABS: 3.6 10*3/uL (ref 0.7–4.0)
Lymphocytes Relative: 18 %
MCH: 35.3 pg — ABNORMAL HIGH (ref 26.0–34.0)
MCHC: 34.1 g/dL (ref 30.0–36.0)
MCV: 103.8 fL — ABNORMAL HIGH (ref 78.0–100.0)
MONOS PCT: 9 %
Monocytes Absolute: 1.8 10*3/uL — ABNORMAL HIGH (ref 0.1–1.0)
NEUTROS ABS: 14.6 10*3/uL — AB (ref 1.7–7.7)
NEUTROS PCT: 72 %
Platelets: 146 10*3/uL — ABNORMAL LOW (ref 150–400)
RBC: 2.66 MIL/uL — AB (ref 4.22–5.81)
RDW: 19.2 % — AB (ref 11.5–15.5)
WBC: 20.2 10*3/uL — AB (ref 4.0–10.5)
nRBC: 1 /100 WBC — ABNORMAL HIGH

## 2015-05-17 LAB — RENAL FUNCTION PANEL
ANION GAP: 4 — AB (ref 5–15)
Albumin: 1.8 g/dL — ABNORMAL LOW (ref 3.5–5.0)
Albumin: 1.8 g/dL — ABNORMAL LOW (ref 3.5–5.0)
Anion gap: 4 — ABNORMAL LOW (ref 5–15)
BUN: 19 mg/dL (ref 6–20)
BUN: 14 mg/dL (ref 6–20)
CALCIUM: 7.3 mg/dL — AB (ref 8.9–10.3)
CO2: 27 mmol/L (ref 22–32)
CO2: 28 mmol/L (ref 22–32)
CREATININE: 1.05 mg/dL (ref 0.61–1.24)
Calcium: 7 mg/dL — ABNORMAL LOW (ref 8.9–10.3)
Chloride: 105 mmol/L (ref 101–111)
Chloride: 105 mmol/L (ref 101–111)
Creatinine, Ser: 1.47 mg/dL — ABNORMAL HIGH (ref 0.61–1.24)
GFR calc Af Amer: >60 mL/min (ref >60)
GFR calc non Af Amer: 50 mL/min — ABNORMAL LOW (ref >60)
GFR, EST AFRICAN AMERICAN: 58 mL/min — AB (ref >60)
GLUCOSE: 145 mg/dL — AB (ref 65–99)
GLUCOSE: 112 mg/dL — AB (ref 65–99)
PHOSPHORUS: 3.3 mg/dL (ref 2.5–4.6)
POTASSIUM: 4.4 mmol/L (ref 3.5–5.1)
POTASSIUM: 4 mmol/L (ref 3.5–5.1)
Phosphorus: 2.7 mg/dL (ref 2.5–4.6)
SODIUM: 137 mmol/L (ref 135–145)
Sodium: 136 mmol/L (ref 135–145)

## 2015-05-17 LAB — MAGNESIUM: Magnesium: 2.3 mg/dL (ref 1.7–2.4)

## 2015-05-17 LAB — GLUCOSE, CAPILLARY
GLUCOSE-CAPILLARY: 88 mg/dL (ref 65–99)
GLUCOSE-CAPILLARY: 99 mg/dL (ref 65–99)
Glucose-Capillary: 119 mg/dL — ABNORMAL HIGH (ref 65–99)
Glucose-Capillary: 123 mg/dL — ABNORMAL HIGH (ref 65–99)
Glucose-Capillary: 134 mg/dL — ABNORMAL HIGH (ref 65–99)

## 2015-05-17 LAB — IGE: IGE (IMMUNOGLOBULIN E), SERUM: 5971 [IU]/mL — AB (ref 0–100)

## 2015-05-17 LAB — APTT

## 2015-05-17 MED ORDER — VITAL HIGH PROTEIN PO LIQD: 1000.0000 mL | ORAL | Status: DC

## 2015-05-17 MED ORDER — NEPRO/CARBSTEADY PO LIQD
1000.0000 mL | ORAL | Status: DC
  Administered 2015-05-17 – 2015-05-18 (×2): 1000 mL
  Filled 2015-05-17 (×4): qty 1000

## 2015-05-17 NOTE — Progress Notes (Signed)
Nutrition Follow-up  INTERVENTION:   Initiate trickle feeds of Nepro @ 20 ml/hr via OGT.   Advancement per MD  Tube feeding goal: Nepro @ 55 mL/hr with 30 mL Prostat once/day which will provide 2476 kcal, 122 grams protein, and 960 mL free water.  RD to continue to monitor  NUTRITION DIAGNOSIS:   Inadequate oral intake related to inability to eat as evidenced by NPO status.  Ongoing.  GOAL:   Patient will meet greater than or equal to 90% of their needs  Not meeting.  MONITOR:   Vent status, Labs, Weight trends, TF tolerance, Skin, I & O's  REASON FOR ASSESSMENT:   Consult Enteral/tube feeding initiation and management (trickle feeds)  ASSESSMENT:   59 year old male with past medical history of hypertension, chronic combined CHF, CAD, NSTEMI (on aspirin and plavix), CKD stage 3 who presented to [facility] ED with reports of neck swelling which has been there for some time but has gotten worse in past few days.   Patient continues on CRRT. MD wanting to start TF with trickle feeds of Nepro. TF protocol inititiated.  Patient has gained 26 lb since admission.  Patient is currently intubated on ventilator support MV: 6.8 L/min Temp (24hrs), Avg:96.3 F (35.7 C), Min:95.2 F (35.1 C), Max:97.5 F (36.4 C)  Propofol: none  Labs reviewed: Elevated Creatinine Mg/Phos WNL  Diet Order:  Diet NPO time specified  Skin:    Incisions/wounds to: low back, R arm, coccyx; R buttocks laceration.  Last BM:  10/29  Height:   Ht Readings from Last 1 Encounters:  05/13/15 5\' 8"  (1.727 m)    Weight:   Wt Readings from Last 1 Encounters:  05/17/15 181 lb 3.5 oz (82.2 kg)    Ideal Body Weight:  70 kg  BMI:  Body mass index is 27.56 kg/(m^2).  Estimated Nutritional Needs:   Kcal:  [id number]  Protein:  118-134 grams  Fluid:  1.2-1.5 L/day  EDUCATION NEEDS:   No education needs identified at this time  Clayton Bibles, MS, RD, LDN Pager: [phone] After Hours Pager:  [phone]

## 2015-05-17 NOTE — Progress Notes (Addendum)
CRITICAL VALUE ALERT  Critical value received: aPTT >200  Date of notification:  05/17/2015   Time of notification:  0623   Critical value read back: yes  Nurse who received alert:  T.Sherral Hammers, RN   MD notified (1st page): Dr. Jimmy Footman  Time of first page:  [phone]  Responding MD:  Dr. Jimmy Footman  Time MD responded:  [phone]

## 2015-05-17 NOTE — Progress Notes (Signed)
ANTIBIOTIC CONSULT NOTE - FOLLOW UP  Pharmacy Consult for Primaxin Indication: Sepsis  No Known Allergies  Patient Measurements: Height: 5\' 8"  (172.7 cm) Weight: 181 lb 3.5 oz (82.2 kg) IBW/kg (Calculated) : 68.4 Adjusted Body Weight:   Vital Signs: Temp: 96.4 F (35.8 C) (10/30 0700) Temp Source: Core (Comment) (10/30 0600) BP: 101/60 mmHg (10/30 0700) Pulse Rate: 58 (10/30 0700) Intake/Output from previous day: 10/29 0701 - 10/30 0700 In: 3360.9 [I.V.:2858.9; NG/GT:90; IV Piggyback:400] Out: 3603 [Urine:147; Emesis/NG output:90] Intake/Output from this shift:    Labs:  Recent Labs  05/15/15 0400  05/16/15 0415 05/16/15 1620 05/17/15 0500  WBC 14.1*  --  20.4*  --  20.2*  HGB 11.5*  --  10.6*  --  9.4*  PLT 160  --  152  --  146*  CREATININE 2.30*  2.36*  < > 1.75* 1.51* 1.47*  < > = values in this interval not displayed. Estimated Creatinine Clearance: 55.2 mL/min (by C-G formula based on Cr of 1.47). No results for input(s): VANCOTROUGH, VANCOPEAK, VANCORANDOM, GENTTROUGH, GENTPEAK, GENTRANDOM, TOBRATROUGH, TOBRAPEAK, TOBRARND, AMIKACINPEAK, AMIKACINTROU, AMIKACIN in the last 72 hours.   Microbiology: Recent Results (from the past 720 hour(s))  Culture, blood (routine x 2)     Status: None   Collection Time: 05/04/15  2:50 AM  Result Value Ref Range Status   Specimen Description BLOOD RIGHT ARM  Final   Special Requests BOTTLES DRAWN AEROBIC AND ANAEROBIC [place]  Final   Culture   Final    NO GROWTH 5 DAYS Performed at [facility]    Report Status 05/09/2015 FINAL  Final  Culture, blood (routine x 2)     Status: None   Collection Time: 05/04/15  2:55 AM  Result Value Ref Range Status   Specimen Description BLOOD RIGHT HAND  Final   Special Requests BOTTLES DRAWN AEROBIC ONLY 10CC  Final   Culture   Final    NO GROWTH 5 DAYS Performed at [facility]    Report Status 05/09/2015 FINAL  Final  Culture, Urine     Status: None   Collection  Time: 05/04/15 10:57 PM  Result Value Ref Range Status   Specimen Description URINE, RANDOM  Final   Special Requests NONE  Final   Culture   Final    NO GROWTH 1 DAY Performed at [facility]    Report Status 05/06/2015 FINAL  Final  Culture, Urine     Status: None   Collection Time: 05/11/15  4:21 PM  Result Value Ref Range Status   Specimen Description URINE, CLEAN CATCH  Final   Special Requests Normal  Final   Culture   Final    NO GROWTH 2 DAYS Performed at [facility]    Report Status 05/13/2015 FINAL  Final  Tissue culture     Status: None   Collection Time: 05/12/15  9:50 AM  Result Value Ref Range Status   Specimen Description BIOPSY BONE MARROW  Final   Special Requests NONE  Final   Gram Stain   Final    MODERATE WBC PRESENT,BOTH PMN AND MONONUCLEAR NO SQUAMOUS EPITHELIAL CELLS SEEN NO ORGANISMS SEEN Performed at [facility]    Culture   Final    FEW ESCHERICHIA COLI Performed at [facility]    Report Status 05/14/2015 FINAL  Final   Organism ID, Bacteria ESCHERICHIA COLI  Final      Susceptibility   Escherichia coli - MIC*  AMPICILLIN >=32 RESISTANT Resistant     AMPICILLIN/SULBACTAM >=32 RESISTANT Resistant     CEFAZOLIN >=64 RESISTANT Resistant     CEFEPIME <=1 SENSITIVE Sensitive     CEFTAZIDIME <=1 SENSITIVE Sensitive     CEFTRIAXONE <=1 SENSITIVE Sensitive     CIPROFLOXACIN <=0.25 SENSITIVE Sensitive     GENTAMICIN <=1 SENSITIVE Sensitive     IMIPENEM <=0.25 SENSITIVE Sensitive     PIP/TAZO 8 SENSITIVE Sensitive     TOBRAMYCIN <=1 SENSITIVE Sensitive     TRIMETH/SULFA Value in next row Sensitive      <=20 SENSITIVE(NOTE)    * FEW ESCHERICHIA COLI  Fungus culture w smear     Status: None (Preliminary result)   Collection Time: 05/12/15  9:50 AM  Result Value Ref Range Status   Specimen Description BIOPSY BONE MARROW  Final   Special Requests NONE  Final   Fungal Smear   Final    NO YEAST OR FUNGAL ELEMENTS  SEEN Performed at [facility]    Culture   Final    CULTURE IN PROGRESS FOR FOUR WEEKS Performed at [facility]    Report Status PENDING  Incomplete  AFB culture with smear     Status: None (Preliminary result)   Collection Time: 05/12/15  9:50 AM  Result Value Ref Range Status   Specimen Description BIOPSY BONE MARROW  Final   Special Requests NONE  Final   Acid Fast Smear   Final    NO ACID FAST BACILLI SEEN Performed at [facility]    Culture   Final    CULTURE WILL BE EXAMINED FOR 6 WEEKS BEFORE ISSUING A FINAL REPORT Performed at [facility]    Report Status PENDING  Incomplete  MRSA PCR Screening     Status: None   Collection Time: 05/13/15  2:59 PM  Result Value Ref Range Status   MRSA by PCR NEGATIVE NEGATIVE Final    Comment:        The GeneXpert MRSA Assay (FDA approved for NASAL specimens only), is one component of a comprehensive MRSA colonization surveillance program. It is not intended to diagnose MRSA infection nor to guide or monitor treatment for MRSA infections.   Culture, blood (x 2)     Status: None   Collection Time: 05/13/15  4:05 PM  Result Value Ref Range Status   Specimen Description BLOOD RIGHT ARM  Final   Special Requests BOTTLES DRAWN AEROBIC AND ANAEROBIC  5CC  Final   Culture  Setup Time   Final    GRAM NEGATIVE RODS AEROBIC BOTTLE ONLY CRITICAL RESULT CALLED TO, READ BACK BY AND VERIFIED WITH: L FREI,RN AT [id number] 05/14/15 BY L BENFIELD    Culture   Final    ENTEROBACTER CLOACAE Performed at [facility]    Report Status 05/16/2015 FINAL  Final   Organism ID, Bacteria ENTEROBACTER CLOACAE  Final      Susceptibility   Enterobacter cloacae - MIC*    CEFAZOLIN >=64 RESISTANT Resistant     CEFEPIME <=1 SENSITIVE Sensitive     CEFTAZIDIME <=1 SENSITIVE Sensitive     CEFTRIAXONE <=1 SENSITIVE Sensitive     CIPROFLOXACIN <=0.25 SENSITIVE Sensitive     GENTAMICIN <=1 SENSITIVE Sensitive      IMIPENEM 1 SENSITIVE Sensitive     TRIMETH/SULFA <=20 SENSITIVE Sensitive     PIP/TAZO <=4 SENSITIVE Sensitive     * ENTEROBACTER CLOACAE  Culture, blood (x 2)  Status: None   Collection Time: 05/13/15  4:24 PM  Result Value Ref Range Status   Specimen Description BLOOD RIGHT ARM  Final   Special Requests BOTTLES DRAWN AEROBIC AND ANAEROBIC  5CC  Final   Culture  Setup Time   Final    GRAM NEGATIVE RODS IN BOTH AEROBIC AND ANAEROBIC BOTTLES CRITICAL RESULT CALLED TO, READ BACK BY AND VERIFIED WITH: S TROTS @0600  05/14/15 MKELLY    Culture   Final    ENTEROBACTER CLOACAE SUSCEPTIBILITIES PERFORMED ON PREVIOUS CULTURE WITHIN THE LAST 5 DAYS. Performed at [facility]    Report Status 05/16/2015 FINAL  Final  Ova and parasite examination     Status: None   Collection Time: 05/14/15  4:25 PM  Result Value Ref Range Status   Specimen Description STOOL  Final   Special Requests NONE  Final   Ova and parasites   Final    NO OVA OR PARASITES SEEN Performed at [facility]    Report Status 05/15/2015 FINAL  Final  Stool culture     Status: None (Preliminary result)   Collection Time: 05/14/15  4:25 PM  Result Value Ref Range Status   Specimen Description STOOL  Final   Special Requests NONE  Final   Culture   Final    NO SUSPICIOUS COLONIES, CONTINUING TO HOLD Performed at [facility]    Report Status PENDING  Incomplete    Anti-infectives    Start     Dose/Rate Route Frequency Ordered Stop   05/15/15 1600  vancomycin (VANCOCIN) IVPB 1000 mg/200 mL premix  Status:  Discontinued     1,000 mg 200 mL/hr over 60 Minutes Intravenous Every 48 hours 05/13/15 1507 05/13/15 2028   05/14/15 2200  vancomycin (VANCOCIN) IVPB 1000 mg/200 mL premix  Status:  Discontinued     1,000 mg 200 mL/hr over 60 Minutes Intravenous Every 24 hours 05/14/15 0633 05/14/15 0640   05/14/15 1200  piperacillin-tazobactam (ZOSYN) IVPB 2.25 g  Status:  Discontinued     2.25 g 100  mL/hr over 30 Minutes Intravenous 4 times per day 05/14/15 0910 05/14/15 1008   05/14/15 1200  piperacillin-tazobactam (ZOSYN) IVPB 3.375 g  Status:  Discontinued     3.375 g 100 mL/hr over 30 Minutes Intravenous 4 times per day 05/14/15 1008 05/14/15 1028   05/14/15 1200  imipenem-cilastatin (PRIMAXIN) 500 mg in sodium chloride 0.9 % 100 mL IVPB     500 mg 200 mL/hr over 30 Minutes Intravenous 4 times per day 05/14/15 1028     05/13/15 2200  piperacillin-tazobactam (ZOSYN) IVPB 3.375 g  Status:  Discontinued     3.375 g 12.5 mL/hr over 240 Minutes Intravenous 3 times per day 05/13/15 1507 05/14/15 0910   05/13/15 2200  vancomycin (VANCOCIN) IVPB 1000 mg/200 mL premix  Status:  Discontinued     1,000 mg 200 mL/hr over 60 Minutes Intravenous Every 48 hours 05/13/15 2028 05/14/15 0631   05/13/15 1430  piperacillin-tazobactam (ZOSYN) IVPB 3.375 g  Status:  Discontinued     3.375 g 100 mL/hr over 30 Minutes Intravenous  Once 05/13/15 1428 05/13/15 2028   05/13/15 1430  vancomycin (VANCOCIN) IVPB 1000 mg/200 mL premix  Status:  Discontinued     1,000 mg 200 mL/hr over 60 Minutes Intravenous  Once 05/13/15 1428 05/13/15 2028   05/07/15 0700  vancomycin (VANCOCIN) IVPB 1000 mg/200 mL premix  Status:  Discontinued     1,000 mg 200 mL/hr over 60  Minutes Intravenous Every 24 hours 05/07/15 0611 05/08/15 1438   05/04/15 1100  piperacillin-tazobactam (ZOSYN) IVPB 3.375 g  Status:  Discontinued     3.375 g 12.5 mL/hr over 240 Minutes Intravenous 3 times per day 05/04/15 1003 05/08/15 1438   05/04/15 0600  vancomycin (VANCOCIN) IVPB 750 mg/150 ml premix  Status:  Discontinued     750 mg 150 mL/hr over 60 Minutes Intravenous Every 24 hours 05/04/15 0557 05/07/15 0610   05/03/15 2200  clindamycin (CLEOCIN) IVPB 600 mg  Status:  Discontinued     600 mg 100 mL/hr over 30 Minutes Intravenous 3 times per day 05/03/15 1524 05/04/15 0225   05/03/15 1330  clindamycin (CLEOCIN) IVPB 600 mg     600 mg 100  mL/hr over 30 Minutes Intravenous  Once 05/03/15 1326 05/03/15 1457      Assessment: 33 yoM on IV antibiotics for sepsis d/t cellulitis, enterobacter bacteremia, and E.Coli in bone marrow, remains intubated, requiring pressors, and on CRRT.  Pharmacy dosing primaxin.   10/16 >> Clinda >> 10/16 10/17 >>Vanc >> 10/21, 10/26 >> 10/27 10/17 >> Zosyn >> 10/21, 10/26 >> 10/27 10/27 >> Primaxin >>  10/17 bloodx2: NGF 10/17 urine: NGF 10/24 urine: NGF   10/25 AFB: NGTD 10/25 Fungus: NGTD 10/25 BM biopsy: Few E.Coli (R amp, unasyn, cefazolin, S cefepime, ceftaz, CTX, cipro, gent, primaxin, zosyn, tobra, bactrim) 10/26 Blood: 2/2 Enterobacter cloacae (R cefazoline, S cefepime, ceftaz, ceftriaxone, cipro, gent, primaxin, zosyn, bactrim) 10/27 O/P: negative 10/27 stool culture: no suspicious colonies, continuing to hold 10/29 blood 2/2: collected  Trough Levels: 10/20 04:57 VT < 4 mcg/ml on 750mg  IV q24h  Temp: now LOW WBC: Remains high (on steroids) Renal: CRRT, effluent rate 28 ml/kg/hr (target 20-35 ml/kg/hr to equate to CrCl 25-50), oliguric 0.1 ml/kg/hr UOP PCT and LA high, now trending down  Goal of Therapy:  Eradication of infection  Plan:  Continue primaxin 500mg  IV q6h F/u cultures, CRRT duration/renal function, clinical course  Ralene Bathe, PharmD, BCPS 05/17/2015, 8:09 AM  Pager: [phone]

## 2015-05-17 NOTE — Progress Notes (Signed)
Name: [name] MRN: [id number] DOB: February 09, 1956    ADMISSION DATE:  05/03/2015 CONSULTATION DATE:  05/13/15  REFERRING MD :  Dr. Doyle Askew   CHIEF COMPLAINT:  Fever of unknown origin, tachypnea/tachycardia  BRIEF PATIENT DESCRIPTION: 59 y/o M admitted 10/16 with complaints of neck swelling.  He was also seen in the ER on 10/13 for nausea / vomiting, sore throat, diarrhea and rash.  He was treated for a possible allergic reaction and released.      SIGNIFICANT EVENTS  10/13  Seen in ER for N/V, sore throat, diarrhea & rash 10/16  Admit to [facility] for facial swelling, cough with frothy yellow sputum, sore throat and hives. 10/26 intubated 10/27 cvvh started  STUDIES:  10/13  RUQ [place] - mild gallbladder wall thickening, no gallstones observed 10/16  CXR - no acute infiltrate 10/16  CT Soft tissue Neck - asymmetric enlargement of R submandibular gland relative to the left is non-specific but could be due to infection, inflammation or tumor.  Negative for abscess, diffuse subcutaneous edema 10/17  MRI Soft Tissue Neck - motion degraded study, no discrete mass, grossly similar subcutaneous edema involving the bilateral submandibular spaces (nonspecific but must consider infection/cellulitis), no discrete abscess  10/21  CXR - no acute infiltrate 10/21  CT Abd/Pelvis - diffuse body wall edema suggestive of anasarca, mesenteric edema but no overt ascites, no acute intra-abdominal abnormalities, stable prostate gland enlargement and small hiatal hernia 10/26 TTE - LV & RV moderately dilated. EF 25-30% w/ diffuse hypokinesis of LV. Moderate MR. No pericardial effusion. 10/26 Renal US - Medical renal disease. Right pleural effusion. No hydronephrosis. 10/27 CT chest, abd, pelvis - Bibasilar atelectasis/ early infiltrate, anasarca. 10/29 RUQ [place] - no stones. No perichole fluid. CBD 64m. No focal liver lesion. Right pleural effusion.  HISTORY OF PRESENT ILLNESS:  59y/o M, homeless male (lives in a  hotel) with PMH of HTN, NSTEMI, HLD, arthritis, anxiety / depression, former polysubstance abuse with cardiomyopathy (EF20%), CKD IV and medical non-compliance [facility] with complaints of neck/face swelling with hives.    He was seen previously in the ER on 10/13 for nausea / vomiting, sore throat, diarrhea and rash.  The patient was treated for a possible allergic reaction and released.  He had a RUQ [place] showed contracted gallbladder with thickening likely secondary to this. It is no right upper quadrant fluid and no signs of biliary ductal dilatation  On 10/16, he initially reported facial swelling, cough with frothy yellow sputum, sore throat and hives.  He reported ongoing facial swelling after 10/13 discharge.  He denied difficulty breathing or swallowing.  Labs at that time - Na 131, K 4.8, Sr Cr 2.26, glucose 63, AST 299, ALT 283, bilirubin 2.0, BNP 752, Troponin 0.05, lipase 16, WBC 16.8, Hgb 17.8, and platelets 253.  Differential showed 10.4 lymph's, eosinophils 1.8, crenated RBC's and atypical lymphocytes.   Initial CXR without acute process.  Neck swelling was evaluated with a CT of the neck was evaluated which showed asymmetric enlargement of R submandibular gland relative to the left is non-specific but could be due to infection, inflammation or tumor.  Negative for abscess. Diffuse subcutaneous edema and follow up MRI recommended.  MRI of the soft tissues of neck showed no discrete mass or abscess.   UDS and alcohol level on admission were normal. Hepatitis A, B, and C serologies were nonrevealing.HIDA scan showed normal filling of the gallbladder with radiotracer, indicating cystic duct patency,  which is not consistent with acute cholecystitis. Prompt radiotracer excretion into the common bile duct and small bowel, with no evidence of biliary obstruction. Normal liver radiotracer uptake and excretion.  Patient reports that he had been drinking prior to admission he has  several beers daily and vodka (noted macrocytic anemia on CBC). He reported that he had been having some epigastric and right upper quadrant pain for several days prior to admission. This was associated with nausea which tended to be worse on an empty stomach, and occasional vomiting. He had no radiation of this pain into his back. He has been having heartburn for 5 days a week for months for which he will use Rolaids.  Follow up lab work on the 17th noted his LFTs trending down, but on the 19th he was noted to have a total bili of 3, ALT 419, AST 547, and alkaline phosphatase 155.  10/20 he was noted to have total bili 3.2, ALT 461, AST 561, alkaline phosphatase 197.    The patient has also had intermittent fever and rash.  Interestingly, WBC reduced when antibiotics were stopped.  Eosinophils rose to 4.1K on 10/25.  On 10/26, he developed tachycardia, tachypnea and fever to 101.  Current labs: WBC 8.1, hgb 9.8, MCV 107/109.  HIV negative.  Blood cultures negative to date.  To further work up fever, on 10/26 he underwent a bone marrow biopsy.  He developed fever, tachypnea and tachycardia on 10/26 and was transferred to ICU.    SUBJECTIVE:  Patient continuing to have some hyperthermia. Tolerating CVVHD. Marginal improvement in vasopressor requirement overnight.  REVIEW OF SYSTEMS:  Unobtainable as patient is intubated & sedated.  VITAL SIGNS: Temp:  [95.2 F (35.1 C)-96.8 F (36 C)] 96.4 F (35.8 C) (10/30 0700) Pulse Rate:  [49-69] 64 (10/30 0751) Resp:  [5-24] 14 (10/30 0751) BP: (83-124)/(43-86) 92/60 mmHg (10/30 0751) SpO2:  [100 %] 100 % (10/30 0751) FiO2 (%):  [30 %] 30 % (10/30 0751) Weight:  [181 lb 3.5 oz (82.2 kg)] 181 lb 3.5 oz (82.2 kg) (10/30 0500)  PHYSICAL EXAMINATION: General:  Sedated. Currently on CVVHD. No distress. There however in place for hyperthermia. Integument:  Warm & dry. Flaking epidermal rash on extremities with exposed skin. HEENT:  Endotracheal tube in place.  Bilateral scleral edema. Cardiovascular:  Regular rhythm. Anasarca improving. Currently normal sinus rhythm on telemetry. Pulmonary:  Coarse breath sounds bilaterally. Symmetric chest wall rise on ventilator. Abdomen: Soft. Hypoactive bowel sounds. Protuberant. Musculoskeletal:  No joint deformity or effusion appreciated. Normal muscle bulk. Neurological: Pupils reactive. Currently sedated. On fentanyl drip.   Recent Labs Lab 05/16/15 0415 05/16/15 1620 05/17/15 0500  NA 134* 137 136  K 4.0 4.0 4.4  CL 100* 104 105  CO2 _0 BUN 23* 21* 19  CREATININE 1.75* 1.51* 1.47*  GLUCOSE 158* 166* 145*    Recent Labs Lab 05/15/15 0400 05/16/15 0415 05/17/15 0500  HGB 11.5* 10.6* 9.4*  HCT 33.7* 30.7* 27.6*  WBC 14.1* 20.4* 20.2*  PLT 160 152 146*   ASSESSMENT / PLAN:  PULMONARY A:  Acute Hypoxic Respiratory Failure - Improving. Multifactorial in setting of septic shock & cardiomyopathy.  P:   Vent bundle 8 cc/kg tidal volume Holding on sedation vacation & SBT pending improvement in hemodynamics  CARDIOVASCULAR R IJ HD catheter 10/26 >>  Lt I J CVL 10/27>> A:   Septic Shock - Improving. Moderate mitral regurgitation Dilated cardiomyopathy  H/O CAD H/O HTN  P:  Continuing Levo gtt to maintain MAP>65 Monitoring on Telemetry Aspirin 81 mg by mouth daily Plavix 75 mg by mouth daily Holding on ACE inhibitor given acute renal failure & hypotension  RENAL A:   Acute renal failure - On CVVHD. Lactic Acidosis - Improving. Hyperkalemia - Resolved.   P:   Awaiting Urine Eos. Continuing CVVHD. Electrolyte monitoring per Nephrology Repeat lactic acid tomorrow morning  GASTROINTESTINAL A:   Shock Liver - Improving transaminitis. CT abd/pelvis neg. Questionable EtOH Liver Disease  P:   Dietitian consult for tube feeding recommendations Repeat LFTs tomorrow morning Pepcid IV daily   HEMATOLOGIC A:   Peripheral eosinophilia - Resolved. Bone marrow biopsy  not c/w with hypereosinophilic syndrome. IgE 5125. Possible Churg-Strauss - Although no pulmonary infiltrates and ANCA is negative. Possible Zosyn Allergy Thrombocytopenia - Possible consumption with 2 clotted filters Leukocytosis - Secondary to steroids & sepsis  P:  Repeat IgE 10/29  pending  Monitoring platelet count daily with CBC Trending leukocytosis daily with CBC Continuing Solu-Cortef IV q12hr SCDs Holding heparin subcutaneous given aPTT >200 on heparin for CVVHD  INFECTIOUS A:   Septic Shock - Shock improving. Enterococcus Bacteremia E coli Osteomyelitis - from bone marrow biopsy  P:   Repeat Blood Cultures Today (10/29) TTE 10/26 without evidence of endocarditis Appreciate ID assistance  [facility] 10/29 >>> BC 10/26 - Enteroccus x2 bottles BC 10/17 - negative  UC 10/26 - negative UC 10/17 - negative  Bone marrow 10/26 - E coli.  Stool Culture 10/26>>>pending Stool O&P 10/26 - negative  HIV 10/21 - negative CMV 10/21 - Negative EBV 10/21 - negative   Primaxin 10/26 >>  Vanco 10/26 - 10/27 Zosyn 10/26 - 10/26 Clinda x 1 10/17  Vanco 10/17 - 10/21  Zosyn 10/17 - 10/21  DERMATOLOGY:  A:  Rash w/ Dermal Peeling - Unclear etiology.   P: Consider skin biopsy for progression. Solu-Cortef 153m IV q12hr  ENDOCRINE A: Hypoglycemia - Improving w/ some Novolog insulin requirement.  P:   Continuing dextrose drip D5 NS at 50cchr and Hydrocortisone. Continuing Accuchecks q4hr  NEUROLOGIC A:  No focal issues on exam 10/29. Sedated for vent tolerance  P:   RASS goal: -1 Fentanyl gtt Versed IV prn  FAMILY  - Updates: No family at bedside today.  - Inter-disciplinary family meet or Palliative Care meeting due by:  05/18/15   TODAY'S SUMMARY: 59year old complex male with septic shock secondary to Escherichia coli osteomyelitis as well as enterococcus bacteremia. I am repeating blood cultures today. Patient continuing on Primaxin. Eosinophilia has  resolved. Continuing patient on Solu-Cortef every 12 hours. Awaiting repeat serum IgE level. Decreasing infusion rate for dextrose. Consult dietitian for initiation of tube feeds at trickle given improvement in vasopressor requirement. Continuing to hold on spontaneous breathing trial pending further improved hemodynamics and can likely undergo spontaneous breathing trial tomorrow morning. Repeat blood culture from 10/29 pending.  Total critical care time spent caring for the patient and reviewing the patient's electronic medical records was 31 minutes today.  JSonia BallerNAshok Cordia M.D. [facility] Pager:  [phone] 3pm or if no response, call [phone] 05/17/2015, 8:53 AM

## 2015-05-17 NOTE — Progress Notes (Signed)
[name] ROUNDING NOTE EVENTS: Remains on norepi and hypothermic on Bair hugger No CRRT filter issues since addition of heparin to circuit  Subjective:  Pt intubated/sedated CRRT day 4 By report responds to stimulation  Objective Vital signs in last 24 hours: Filed Vitals:   05/17/15 0900 05/17/15 1000 05/17/15 1100 05/17/15 1124  BP: 91/64 94/60 103/61 103/61  Pulse: 63 63 62 75  Temp: 97.2 F (36.2 C) 97.3 F (36.3 C) 97.3 F (36.3 C)   TempSrc: Core (Comment) Core (Comment) Core (Comment)   Resp: 14 14 14 14   Height:      Weight:      SpO2: 100% 100% 100% 100%   Weight change: 1.6 kg (3 lb 8.4 oz)  Intake/Output Summary (Last 24 hours) at 05/17/15 1201 Last data filed at 05/17/15 1100  Gross per 24 hour  Intake 3143.93 ml  Output   3487 ml  Net -343.07 ml  UOP 247 ml  Physical Exam:  BP 103/61 mmHg  Pulse 75  Temp(Src) 97.3 F (36.3 C) (Core (Comment))  Resp 14  Ht 5\' 8"  (1.727 m)  Wt 82.2 kg (181 lb 3.5 oz)  BMI 27.56 kg/m2  SpO2 100%  Last CVP 8 yesterday - none since Pt intubated, sedated ETT, right IJ trialysis catheter (10/27) - needs new dressing Left IJ central line, foley with brown urine only small amounts Anteriorly clear S1S2 No S3 or rub Abd  + BS and unable to assess tenderness Skin exfoliation from hands, soles of feet 1+ pitting edema LE's/UE's unchanged  Labs: Basic Metabolic Panel:  Recent Labs Lab 05/14/15 1130 05/14/15 1600 05/15/15 0400 05/15/15 1550 05/16/15 0415 05/16/15 1620 05/17/15 0500  NA 139 138 135  136 135 134* 137 136  K 4.8 4.4 3.6  3.4* 3.7 4.0 4.0 4.4  CL 102 100* 93*  93* 98* 100* 104 105  CO2 27 30 34*  35* 32 28 26 27   GLUCOSE 81 89 150*  156* 150* 158* 166* 145*  BUN 51* 46* 33*  33* 27* 23* 21* 19  CREATININE 3.38* 3.12* 2.30*  2.36* 1.98* 1.75* 1.51* 1.47*  CALCIUM 7.8* 7.1* 6.7*  6.7* 6.2* 6.5* 6.7* 7.0*  PHOS 6.5* 6.5* 5.1*  5.2* 4.3 4.5 3.6 3.3     Recent Labs Lab  05/13/15 1624  05/15/15 0400  05/16/15 0415 05/16/15 1620 05/17/15 0500  AST 1159*  --  981*  --  572*  --   --   ALT 722*  --  621*  --  422*  --   --   ALKPHOS 307*  --  211*  --  170*  --   --   BILITOT 9.0*  --  9.0*  --  9.4*  --   --   PROT 7.6  --  5.7*  --  5.2*  --   --   ALBUMIN 3.0*  < > 2.0*  2.1*  < > 1.8* 1.8* 1.8*  < > = values in this interval not displayed.   Recent Labs Lab 05/14/15 0415 05/15/15 0400 05/16/15 0415 05/17/15 0500  WBC 10.4 14.1* 20.4* 20.2*  NEUTROABS 7.8* 12.6* 17.0* 14.6*  HGB 10.5* 11.5* 10.6* 9.4*  HCT 28.9* 33.7* 30.7* 27.6*  MCV 111.2* 104.0* 107.0* 103.8*  PLT 188 160 152 146*    Recent Labs Lab 05/16/15 1633 05/16/15 2002 05/16/15 2349 05/17/15 0403 05/17/15 0754  GLUCAP 132* 140* 114* 119* 123*   ABG    Component Value Date/Time  PHART 7.427 05/16/2015 1821   PCO2ART 34.5* 05/16/2015 1821   PO2ART 122* 05/16/2015 1821   HCO3 22.7 05/16/2015 1821   TCO2 20.8 05/16/2015 1821   ACIDBASEDEF 1.1 05/16/2015 1821   O2SAT 98.7 05/16/2015 1821   Results for [name], [name] (MRN [name]) as of 05/16/2015 08:01  Ref. Range 05/13/2015 19:45 05/13/2015 23:40 05/14/2015 04:15 05/15/2015 11:10 05/16/2015 04:15  Lactic Acid, Venous Latest Ref Range: 0.5-2.0 mmol/L 10.4 (HH) 5.4 (HH) 5.6 (HH) 3.1 (HH) 2.7 [facility])   Results for [name], [name] (MRN [name]) as of 05/14/2015 11:09  Ref. Range 05/06/2015 11:34  Hep A IgM Latest Ref Range: Negative  Negative  Hepatitis B Surface Ag Latest Ref Range: Negative  Negative  Hep B C IgM Latest Ref Range: Negative  Negative  HCV Ab Latest Ref Range: 0.0-0.9 s/co ratio <0.1   Results for [name], [name] (MRN [name]) as of 05/14/2015 11:09  Ref. Range 05/10/2015 05:40  ANA Ab, IFA Unknown Negative  Angiotensin-Converting Enzyme Latest Ref Range: 14-82 U/L 91 (H)  CCP Antibodies IgG/IgA Latest Ref Range: 0-19 units 9  ds DNA Ab Latest Ref Range: 0-9 IU/mL 1  Rhuematoid fact  SerPl-aCnc Latest Ref Range: 0.0-13.9 IU/mL <10.0  C-ANCA Latest Ref Range: Neg:<1:20 titer <1:20  P-ANCA Latest Ref Range: Neg:<1:20 titer <1:20  Atypical P-ANCA titer Latest Ref Range: Neg:<1:20 titer <1:20   Results for [name], [name] (MRN [name]) as of 05/14/2015 11:09  Ref. Range 05/13/2015 19:48  IgE (Immunoglobulin E), Serum Latest Ref Range: 0-100 IU/mL 5125 (H)  Results for [name], [name] (MRN [name]) as of 05/14/2015 11:09  Ref. Range 05/10/2015 05:40  SSA (Ro) (ENA) Antibody, IgG Latest Ref Range: 0.0-0.9 AI <0.2  SSB (La) (ENA) Antibody, IgG Latest Ref Range: 0.0-0.9 AI <0.2  Scleroderma (Scl-70) (ENA) Antibody, IgG Latest Ref Range: 0.0-0.9 AI <0.2   Blood cultures 2/2 + enterobacter cloacae Bone marrow culture + EColi  Studies/Results: Dg Chest Port 1 View  05/16/2015  CLINICAL DATA:  Acute respiratory failure. EXAM: PORTABLE CHEST 1 VIEW COMPARISON:  05/15/2015 and 05/14/2015 FINDINGS: Endotracheal tube, NG tube, and left central venous catheter are unchanged. Right sided central venous catheter tip is in the upper superior vena cava, slightly retracted since the prior study. Slight persistent atelectasis at both lung bases posterior medially. The lungs are otherwise clear. Vascularity is normal. Chronic cardiomegaly. No acute osseous abnormality. IMPRESSION: Slight retraction of the right central venous catheter, now just above the azygos vein confluence in the superior vena cava. Persistent slight atelectasis posterior medially at the bases. Electronically Signed   By: Lorriane Shire M.D.   On: 05/16/2015 07:21   US Abdomen Limited Ruq  05/16/2015  CLINICAL DATA:  Transaminitis EXAM: US ABDOMEN LIMITED - RIGHT UPPER QUADRANT COMPARISON:  CT abdomen pelvis dated 05/14/2015 FINDINGS: Gallbladder: No gallstones, gallbladder wall thickening, or pericholecystic fluid. Sonographic Murphy's sign could not be assessed due to patient responsiveness. Common bile duct: Diameter:  3 mm Liver: No focal lesion identified. Within normal limits in parenchymal echogenicity. Additional comments: Right pleural effusion. IMPRESSION: Negative right upper quadrant ultrasound. Electronically Signed   By: Julian Hy M.D.   On: 05/16/2015 11:33   Medications: . dextrose Stopped (05/15/15 1109)  . dextrose 5 % and 0.45% NaCl 50 mL/hr at 05/17/15 1100  . fentaNYL infusion INTRAVENOUS 50 mcg/hr (05/17/15 1100)  . heparin 10,000 units/ 20 mL infusion syringe 1,100 Units/hr (05/17/15 1100)  . norepinephrine (LEVOPHED) Adult infusion 5.973 mcg/min (05/17/15 1100)  . dialysis replacement fluid (  prismasate) 200 mL/hr at 05/16/15 0953  . dialysis replacement fluid (prismasate) 400 mL/hr at 05/16/15 2353  . dialysate (PRISMASATE) 1,500 mL/hr at 05/17/15 0959   . antiseptic oral rinse  7 mL Mouth Rinse QID  . aspirin  81 mg Oral Daily  . chlorhexidine gluconate  15 mL Mouth Rinse BID  . clopidogrel  75 mg Oral Daily  . diphenhydrAMINE  25 mg Per Tube 4 times per day  . famotidine  20 mg Oral Daily  . fentaNYL (SUBLIMAZE) injection  50 mcg Intravenous Once  . hydrocortisone sodium succinate  100 mg Intravenous Q12H  . imipenem-cilastatin  500 mg Intravenous 4 times per day  . insulin aspart  0-9 Units Subcutaneous 6 times per day  . mirabegron ER  25 mg Oral Daily  . sodium chloride  3 mL Intravenous Q12H   BACKGROUND 59 year old African-American man with background of CKD3 (baseline creatinine 1.4-1.8), HTN, ischemic dilated CM with CHF (EF 20-25 percent), history of poor compliance/polysubstance abuse/homelessness. Presented with a convoluted recent past medical history with nausea, vomiting, sore throat with diarrhea and a rash that was treated as an allergic reaction in the ED. Then admitted a few days later (on 10/16) for complaints of neck swelling/facial swelling with cough productive of frothy yellow sputum together with sore throat and hives. He is septic with GNR bacteremia  - enterobacter (and GNR on BM bx done 10/26 - EColi), has eosinophilia (uncertain etiology), abnormal LFT's, and acute renal failure in this setting. Metabolic derangements (hyperkalemia and metabolic acidosis) failed conservative management and CRRT was initiated 10/27.  1. Acute renal failure (on CKD3) - oligoanuric in setting of sepsis/hypotension. Remains on pressors. Cannot entirely r/o acute interstitial nephritis but had a paucity of WBC's in urine early on. I note that urine eos have been ordered - appears that cytology was done on the urine but not eosinophils. Possibly moot point since on stress steroids which would treat if drug related (and in this case would need biopsy for diagnosis).  CRRT initiated 10/27 for acidosis and hyperkalemia - both of which are better  Heparin added to CRRT circuit d/t clotting of filters 10/28 - no issues since. K and phos are fine and has had no replacement needs as of yet, acid base stable on all 4K fluids.  Continue to keep volume even on current prescription. 2. Hyperkalemia - Resolved. CRRT fluids changed to all 4K and potassium stable.    3. Metabolic acidosis - Corrected. Off isotonicbicarb. Lactic acid slowly falling 4. Gram negative rod bacteremia/sepsis with enterobacter in blood cultures, EColi in the bone marrow. ID following. On primaxin. Source remains known. 5. Eosinophilia - etiology not known. Only abnl serology is mildly elevated ACE level and elevated IgE.  6. Abnormal LFT's  - improving as of 10/29 - not rechecked today.   Jamal Maes, MD [facility] [phone] pager 05/17/2015, 12:01 PM

## 2015-05-17 NOTE — Progress Notes (Signed)
Pt in and out of bradycardia since 19:00. Pt shows cardiac similarities on flowsheet from previous shift.  At approximately 22:30 Pt dropped to mid-40 bpm and did not recover to NSR.  Called [facility] to report cardiac status of Pt.  Instructed to continue to monitor Pt status. No new orders at this time.

## 2015-05-17 NOTE — Progress Notes (Signed)
INFECTIOUS DISEASE PROGRESS NOTE  ID: [name] is a 59 y.o. male with  Principal Problem:   Sepsis due to cellulitis [facility]) Active Problems:   Homelessness   Neck swelling   Acute renal failure superimposed on stage 3 chronic kidney disease (HCC)   Chronic combined systolic (congestive) and diastolic (congestive) heart failure (HCC)   Benign essential HTN   History of non-ST elevation myocardial infarction (NSTEMI)   Hyponatremia   Abnormal LFTs   Polysubstance abuse   Noncompliance   Leukocytosis   Cellulitis of neck   Dysphagia   Elevated LFTs   Submandibular gland infection   Urticaria   Rash and nonspecific skin eruption   Dry mouth   Dyspnea   FUO (fever of unknown origin)   Acute respiratory failure ([place])   Renal failure (ARF), acute on chronic ([place])   Sepsis ([place])  Subjective: On vent  Abtx:  Anti-infectives    Start     Dose/Rate Route Frequency Ordered Stop   05/15/15 1600  vancomycin (VANCOCIN) IVPB 1000 mg/200 mL premix  Status:  Discontinued     1,000 mg 200 mL/hr over 60 Minutes Intravenous Every 48 hours 05/13/15 1507 05/13/15 2028   05/14/15 2200  vancomycin (VANCOCIN) IVPB 1000 mg/200 mL premix  Status:  Discontinued     1,000 mg 200 mL/hr over 60 Minutes Intravenous Every 24 hours 05/14/15 0633 05/14/15 0640   05/14/15 1200  piperacillin-tazobactam (ZOSYN) IVPB 2.25 g  Status:  Discontinued     2.25 g 100 mL/hr over 30 Minutes Intravenous 4 times per day 05/14/15 0910 05/14/15 1008   05/14/15 1200  piperacillin-tazobactam (ZOSYN) IVPB 3.375 g  Status:  Discontinued     3.375 g 100 mL/hr over 30 Minutes Intravenous 4 times per day 05/14/15 1008 05/14/15 1028   05/14/15 1200  imipenem-cilastatin (PRIMAXIN) 500 mg in sodium chloride 0.9 % 100 mL IVPB     500 mg 200 mL/hr over 30 Minutes Intravenous 4 times per day 05/14/15 1028     05/13/15 2200  piperacillin-tazobactam (ZOSYN) IVPB 3.375 g  Status:  Discontinued     3.375 g 12.5 mL/hr over  240 Minutes Intravenous 3 times per day 05/13/15 1507 05/14/15 0910   05/13/15 2200  vancomycin (VANCOCIN) IVPB 1000 mg/200 mL premix  Status:  Discontinued     1,000 mg 200 mL/hr over 60 Minutes Intravenous Every 48 hours 05/13/15 2028 05/14/15 0631   05/13/15 1430  piperacillin-tazobactam (ZOSYN) IVPB 3.375 g  Status:  Discontinued     3.375 g 100 mL/hr over 30 Minutes Intravenous  Once 05/13/15 1428 05/13/15 2028   05/13/15 1430  vancomycin (VANCOCIN) IVPB 1000 mg/200 mL premix  Status:  Discontinued     1,000 mg 200 mL/hr over 60 Minutes Intravenous  Once 05/13/15 1428 05/13/15 2028   05/07/15 0700  vancomycin (VANCOCIN) IVPB 1000 mg/200 mL premix  Status:  Discontinued     1,000 mg 200 mL/hr over 60 Minutes Intravenous Every 24 hours 05/07/15 0611 05/08/15 1438   05/04/15 1100  piperacillin-tazobactam (ZOSYN) IVPB 3.375 g  Status:  Discontinued     3.375 g 12.5 mL/hr over 240 Minutes Intravenous 3 times per day 05/04/15 1003 05/08/15 1438   05/04/15 0600  vancomycin (VANCOCIN) IVPB 750 mg/150 ml premix  Status:  Discontinued     750 mg 150 mL/hr over 60 Minutes Intravenous Every 24 hours 05/04/15 0557 05/07/15 0610   05/03/15 2200  clindamycin (CLEOCIN) IVPB 600 mg  Status:  Discontinued  600 mg 100 mL/hr over 30 Minutes Intravenous 3 times per day 05/03/15 1524 05/04/15 0225   05/03/15 1330  clindamycin (CLEOCIN) IVPB 600 mg     600 mg 100 mL/hr over 30 Minutes Intravenous  Once 05/03/15 1326 05/03/15 1457      Medications:  Scheduled: . antiseptic oral rinse  7 mL Mouth Rinse QID  . aspirin  81 mg Oral Daily  . chlorhexidine gluconate  15 mL Mouth Rinse BID  . clopidogrel  75 mg Oral Daily  . diphenhydrAMINE  25 mg Per Tube 4 times per day  . famotidine  20 mg Oral Daily  . feeding supplement (NEPRO CARB STEADY)  1,000 mL Per Tube Q24H  . fentaNYL (SUBLIMAZE) injection  50 mcg Intravenous Once  . hydrocortisone sodium succinate  100 mg Intravenous Q12H  .  imipenem-cilastatin  500 mg Intravenous 4 times per day  . insulin aspart  0-9 Units Subcutaneous 6 times per day  . mirabegron ER  25 mg Oral Daily  . sodium chloride  3 mL Intravenous Q12H    Objective: Vital signs in last 24 hours: Temp:  [95.2 F (35.1 C)-97.5 F (36.4 C)] 97.5 F (36.4 C) (10/30 1200) Pulse Rate:  [49-75] 58 (10/30 1200) Resp:  [5-24] 14 (10/30 1200) BP: (87-124)/(44-85) 91/56 mmHg (10/30 1200) SpO2:  [100 %] 100 % (10/30 1200) FiO2 (%):  [30 %] 30 % (10/30 1200) Weight:  [82.2 kg (181 lb 3.5 oz)] 82.2 kg (181 lb 3.5 oz) (10/30 0500)   General appearance: no distress and diffuse edema Resp: coarse breath sounds Cardio: regular rate and rhythm GI: normal findings: bowel sounds normal and soft, non-tender  Lab Results  Recent Labs  05/16/15 0415 05/16/15 1620 05/17/15 0500  WBC 20.4*  --  20.2*  HGB 10.6*  --  9.4*  HCT 30.7*  --  27.6*  NA 134* 137 136  K 4.0 4.0 4.4  CL 100* 104 105  CO2 28 26 27   BUN 23* 21* 19  CREATININE 1.75* 1.51* 1.47*   Liver Panel  Recent Labs  05/15/15 0400  05/16/15 0415 05/16/15 1620 05/17/15 0500  PROT 5.7*  --  5.2*  --   --   ALBUMIN 2.0*  2.1*  < > 1.8* 1.8* 1.8*  AST 981*  --  572*  --   --   ALT 621*  --  422*  --   --   ALKPHOS 211*  --  170*  --   --   BILITOT 9.0*  --  9.4*  --   --   BILIDIR 5.5*  --  5.5*  --   --   IBILI 3.5*  --  3.9*  --   --   < > = values in this interval not displayed. Sedimentation Rate No results for input(s): ESRSEDRATE in the last 72 hours. C-Reactive Protein No results for input(s): CRP in the last 72 hours.  Microbiology: Recent Results (from the past 240 hour(s))  Culture, Urine     Status: None   Collection Time: 05/11/15  4:21 PM  Result Value Ref Range Status   Specimen Description URINE, CLEAN CATCH  Final   Special Requests Normal  Final   Culture   Final    NO GROWTH 2 DAYS Performed at [facility]    Report Status 05/13/2015 FINAL  Final   Tissue culture     Status: None   Collection Time: 05/12/15  9:50 AM  Result Value  Ref Range Status   Specimen Description BIOPSY BONE MARROW  Final   Special Requests NONE  Final   Gram Stain   Final    MODERATE WBC PRESENT,BOTH PMN AND MONONUCLEAR NO SQUAMOUS EPITHELIAL CELLS SEEN NO ORGANISMS SEEN Performed at [facility]    Culture   Final    FEW ESCHERICHIA COLI Performed at [facility]    Report Status 05/14/2015 FINAL  Final   Organism ID, Bacteria ESCHERICHIA COLI  Final      Susceptibility   Escherichia coli - MIC*    AMPICILLIN >=32 RESISTANT Resistant     AMPICILLIN/SULBACTAM >=32 RESISTANT Resistant     CEFAZOLIN >=64 RESISTANT Resistant     CEFEPIME <=1 SENSITIVE Sensitive     CEFTAZIDIME <=1 SENSITIVE Sensitive     CEFTRIAXONE <=1 SENSITIVE Sensitive     CIPROFLOXACIN <=0.25 SENSITIVE Sensitive     GENTAMICIN <=1 SENSITIVE Sensitive     IMIPENEM <=0.25 SENSITIVE Sensitive     PIP/TAZO 8 SENSITIVE Sensitive     TOBRAMYCIN <=1 SENSITIVE Sensitive     TRIMETH/SULFA Value in next row Sensitive      <=20 SENSITIVE(NOTE)    * FEW ESCHERICHIA COLI  Fungus culture w smear     Status: None (Preliminary result)   Collection Time: 05/12/15  9:50 AM  Result Value Ref Range Status   Specimen Description BIOPSY BONE MARROW  Final   Special Requests NONE  Final   Fungal Smear   Final    NO YEAST OR FUNGAL ELEMENTS SEEN Performed at [facility]    Culture   Final    CULTURE IN PROGRESS FOR FOUR WEEKS Performed at [facility]    Report Status PENDING  Incomplete  AFB culture with smear     Status: None (Preliminary result)   Collection Time: 05/12/15  9:50 AM  Result Value Ref Range Status   Specimen Description BIOPSY BONE MARROW  Final   Special Requests NONE  Final   Acid Fast Smear   Final    NO ACID FAST BACILLI SEEN Performed at [facility]    Culture   Final    CULTURE WILL BE EXAMINED FOR 6 WEEKS BEFORE  ISSUING A FINAL REPORT Performed at [facility]    Report Status PENDING  Incomplete  MRSA PCR Screening     Status: None   Collection Time: 05/13/15  2:59 PM  Result Value Ref Range Status   MRSA by PCR NEGATIVE NEGATIVE Final    Comment:        The GeneXpert MRSA Assay (FDA approved for NASAL specimens only), is one component of a comprehensive MRSA colonization surveillance program. It is not intended to diagnose MRSA infection nor to guide or monitor treatment for MRSA infections.   Culture, blood (x 2)     Status: None   Collection Time: 05/13/15  4:05 PM  Result Value Ref Range Status   Specimen Description BLOOD RIGHT ARM  Final   Special Requests BOTTLES DRAWN AEROBIC AND ANAEROBIC  5CC  Final   Culture  Setup Time   Final    GRAM NEGATIVE RODS AEROBIC BOTTLE ONLY CRITICAL RESULT CALLED TO, READ BACK BY AND VERIFIED WITH: L FREI,RN AT [id number] 05/14/15 BY L BENFIELD    Culture   Final    ENTEROBACTER CLOACAE Performed at [facility]    Report Status 05/16/2015 FINAL  Final   Organism ID, Bacteria ENTEROBACTER CLOACAE  Final  Susceptibility   Enterobacter cloacae - MIC*    CEFAZOLIN >=64 RESISTANT Resistant     CEFEPIME <=1 SENSITIVE Sensitive     CEFTAZIDIME <=1 SENSITIVE Sensitive     CEFTRIAXONE <=1 SENSITIVE Sensitive     CIPROFLOXACIN <=0.25 SENSITIVE Sensitive     GENTAMICIN <=1 SENSITIVE Sensitive     IMIPENEM 1 SENSITIVE Sensitive     TRIMETH/SULFA <=20 SENSITIVE Sensitive     PIP/TAZO <=4 SENSITIVE Sensitive     * ENTEROBACTER CLOACAE  Culture, blood (x 2)     Status: None   Collection Time: 05/13/15  4:24 PM  Result Value Ref Range Status   Specimen Description BLOOD RIGHT ARM  Final   Special Requests BOTTLES DRAWN AEROBIC AND ANAEROBIC  5CC  Final   Culture  Setup Time   Final    GRAM NEGATIVE RODS IN BOTH AEROBIC AND ANAEROBIC BOTTLES CRITICAL RESULT CALLED TO, READ BACK BY AND VERIFIED WITH: S TROTS @0600  05/14/15 MKELLY      Culture   Final    ENTEROBACTER CLOACAE SUSCEPTIBILITIES PERFORMED ON PREVIOUS CULTURE WITHIN THE LAST 5 DAYS. Performed at [facility]    Report Status 05/16/2015 FINAL  Final  Ova and parasite examination     Status: None   Collection Time: 05/14/15  4:25 PM  Result Value Ref Range Status   Specimen Description STOOL  Final   Special Requests NONE  Final   Ova and parasites   Final    NO OVA OR PARASITES SEEN Performed at [facility]    Report Status 05/15/2015 FINAL  Final  Stool culture     Status: None (Preliminary result)   Collection Time: 05/14/15  4:25 PM  Result Value Ref Range Status   Specimen Description STOOL  Final   Special Requests NONE  Final   Culture   Final    NO SUSPICIOUS COLONIES, CONTINUING TO HOLD Performed at [facility]    Report Status PENDING  Incomplete    Studies/Results: Dg Chest Port 1 View  05/16/2015  CLINICAL DATA:  Acute respiratory failure. EXAM: PORTABLE CHEST 1 VIEW COMPARISON:  05/15/2015 and 05/14/2015 FINDINGS: Endotracheal tube, NG tube, and left central venous catheter are unchanged. Right sided central venous catheter tip is in the upper superior vena cava, slightly retracted since the prior study. Slight persistent atelectasis at both lung bases posterior medially. The lungs are otherwise clear. Vascularity is normal. Chronic cardiomegaly. No acute osseous abnormality. IMPRESSION: Slight retraction of the right central venous catheter, now just above the azygos vein confluence in the superior vena cava. Persistent slight atelectasis posterior medially at the bases. Electronically Signed   By: Lorriane Shire M.D.   On: 05/16/2015 07:21   US Abdomen Limited Ruq  05/16/2015  CLINICAL DATA:  Transaminitis EXAM: US ABDOMEN LIMITED - RIGHT UPPER QUADRANT COMPARISON:  CT abdomen pelvis dated 05/14/2015 FINDINGS: Gallbladder: No gallstones, gallbladder wall thickening, or pericholecystic fluid. Sonographic  Murphy's sign could not be assessed due to patient responsiveness. Common bile duct: Diameter: 3 mm Liver: No focal lesion identified. Within normal limits in parenchymal echogenicity. Additional comments: Right pleural effusion. IMPRESSION: Negative right upper quadrant ultrasound. Electronically Signed   By: Julian Hy M.D.   On: 05/16/2015 11:33     Assessment/Plan: FUO Enterobacter bacteremia E coli on Bone Marrow Bx Will repeat his BCx for clearance. ? translocation from liver/gut disfunction Leukocytosis from steroids  Eosinophilia on Bone marrow Bx I would consider discussing his case with  allergy on 10-31 Stool O & P negative  Hepatitis Hepatitis serologies negative Would imagine that some of this is "shock liver" as he started CRRT on 10-27.  His RUQ u/s was unrevealing. As was his CT   Neck swelling ARF on CKD 3 Will continue on CRRT  Ischemic Dilated CM (20-25%)  Homeless  Polysubstance abuse  Severe protein calorie malnutrition Started on tube feeds      Total days of antibiotics: 13 imipenem ( start 10-27)     Bobby Rumpf Infectious Diseases (pager) [phone] [email or web address] 05/17/2015, 12:31 PM  LOS: 13 days

## 2015-05-18 DIAGNOSIS — Z789 Other specified health status: Secondary | ICD-10-CM

## 2015-05-18 DIAGNOSIS — Z978 Presence of other specified devices: Secondary | ICD-10-CM | POA: Insufficient documentation

## 2015-05-18 DIAGNOSIS — A4181 Sepsis due to Enterococcus: Secondary | ICD-10-CM

## 2015-05-18 LAB — CBC WITH DIFFERENTIAL/PLATELET
BASOS ABS: 0.2 10*3/uL — AB (ref 0.0–0.1)
BASOS PCT: 1 %
EOS ABS: 0 10*3/uL (ref 0.0–0.7)
Eosinophils Relative: 0 %
HCT: 25.7 % — ABNORMAL LOW (ref 39.0–52.0)
HEMOGLOBIN: 9 g/dL — AB (ref 13.0–17.0)
LYMPHS PCT: 26 %
Lymphs Abs: 5.3 10*3/uL — ABNORMAL HIGH (ref 0.7–4.0)
MCH: 37.8 pg — ABNORMAL HIGH (ref 26.0–34.0)
MCHC: 35 g/dL (ref 30.0–36.0)
MCV: 108 fL — ABNORMAL HIGH (ref 78.0–100.0)
MONOS PCT: 8 %
Monocytes Absolute: 1.6 10*3/uL — ABNORMAL HIGH (ref 0.1–1.0)
NEUTROS PCT: 65 %
Neutro Abs: 13.4 10*3/uL — ABNORMAL HIGH (ref 1.7–7.7)
PLATELETS: 146 10*3/uL — AB (ref 150–400)
RBC: 2.38 MIL/uL — ABNORMAL LOW (ref 4.22–5.81)
RDW: 20.5 % — ABNORMAL HIGH (ref 11.5–15.5)
WBC: 20.5 10*3/uL — ABNORMAL HIGH (ref 4.0–10.5)

## 2015-05-18 LAB — RENAL FUNCTION PANEL
ALBUMIN: 1.8 g/dL — AB (ref 3.5–5.0)
ANION GAP: 3 — AB (ref 5–15)
Albumin: 1.8 g/dL — ABNORMAL LOW (ref 3.5–5.0)
Anion gap: 6 (ref 5–15)
BUN: 20 mg/dL (ref 6–20)
BUN: 18 mg/dL (ref 6–20)
CHLORIDE: 104 mmol/L (ref 101–111)
CO2: 27 mmol/L (ref 22–32)
CO2: 27 mmol/L (ref 22–32)
CREATININE: 1.31 mg/dL — AB (ref 0.61–1.24)
Calcium: 7.6 mg/dL — ABNORMAL LOW (ref 8.9–10.3)
Calcium: 7.5 mg/dL — ABNORMAL LOW (ref 8.9–10.3)
Chloride: 105 mmol/L (ref 101–111)
Creatinine, Ser: 1.14 mg/dL (ref 0.61–1.24)
GFR calc Af Amer: >60 mL/min (ref >60)
GFR, EST NON AFRICAN AMERICAN: 57 mL/min — AB (ref >60)
Glucose, Bld: 130 mg/dL — ABNORMAL HIGH (ref 65–99)
Glucose, Bld: 130 mg/dL — ABNORMAL HIGH (ref 65–99)
PHOSPHORUS: 3 mg/dL (ref 2.5–4.6)
POTASSIUM: 3.7 mmol/L (ref 3.5–5.1)
POTASSIUM: 3.8 mmol/L (ref 3.5–5.1)
Phosphorus: 2.9 mg/dL (ref 2.5–4.6)
Sodium: 137 mmol/L (ref 135–145)
Sodium: 135 mmol/L (ref 135–145)

## 2015-05-18 LAB — GLUCOSE, CAPILLARY
GLUCOSE-CAPILLARY: 100 mg/dL — AB (ref 65–99)
GLUCOSE-CAPILLARY: 102 mg/dL — AB (ref 65–99)
GLUCOSE-CAPILLARY: 118 mg/dL — AB (ref 65–99)
Glucose-Capillary: 118 mg/dL — ABNORMAL HIGH (ref 65–99)
Glucose-Capillary: 131 mg/dL — ABNORMAL HIGH (ref 65–99)
Glucose-Capillary: 122 mg/dL — ABNORMAL HIGH (ref 65–99)

## 2015-05-18 LAB — BODY FLUID CELL COUNT WITH DIFFERENTIAL
EOS FL: 0 %
LYMPHS FL: 2 %
MONOCYTE-MACROPHAGE-SEROUS FLUID: 9 % — AB (ref 50–90)
NEUTROPHIL FLUID: 89 % — AB (ref 0–25)
WBC FLUID: UNDETERMINED uL (ref 0–1000)

## 2015-05-18 LAB — POCT ACTIVATED CLOTTING TIME
ACTIVATED CLOTTING TIME: 140 s
ACTIVATED CLOTTING TIME: 171 s
ACTIVATED CLOTTING TIME: 184 s
ACTIVATED CLOTTING TIME: 196 s
ACTIVATED CLOTTING TIME: 208 s
ACTIVATED CLOTTING TIME: 208 s
Activated Clotting Time: 171 seconds
Activated Clotting Time: 165 seconds
Activated Clotting Time: 171 seconds
Activated Clotting Time: 202 seconds

## 2015-05-18 LAB — HEPATIC FUNCTION PANEL
ALBUMIN: 1.8 g/dL — AB (ref 3.5–5.0)
ALT: 262 U/L — AB (ref 17–63)
AST: 386 U/L — AB (ref 15–41)
Alkaline Phosphatase: 186 U/L — ABNORMAL HIGH (ref 38–126)
Bilirubin, Direct: 7.7 mg/dL — ABNORMAL HIGH (ref 0.1–0.5)
Indirect Bilirubin: 4.9 mg/dL — ABNORMAL HIGH (ref 0.3–0.9)
TOTAL PROTEIN: 5.6 g/dL — AB (ref 6.5–8.1)
Total Bilirubin: 12.6 mg/dL — ABNORMAL HIGH (ref 0.3–1.2)

## 2015-05-18 LAB — MAGNESIUM: MAGNESIUM: 2.3 mg/dL (ref 1.7–2.4)

## 2015-05-18 LAB — STOOL CULTURE

## 2015-05-18 LAB — APTT

## 2015-05-18 LAB — LACTIC ACID, PLASMA: LACTIC ACID, VENOUS: 2 mmol/L (ref 0.5–2.0)

## 2015-05-18 MED ORDER — DEXTROSE 5 % IV SOLN
2.0000 g | INTRAVENOUS | Status: DC
  Administered 2015-05-18 – 2015-05-26 (×9): 2 g via INTRAVENOUS
  Filled 2015-05-18 (×10): qty 2

## 2015-05-18 MED ORDER — LORAZEPAM 0.5 MG PO TABS
0.5000 mg | ORAL_TABLET | Freq: Three times a day (TID) | ORAL | Status: DC | PRN
  Administered 2015-05-19: 0.5 mg via ORAL
  Filled 2015-05-18: qty 1

## 2015-05-18 NOTE — Procedures (Signed)
Bedside Bronchoscopy Procedure Note Konner Bilski [id number] 11-24-1955  Procedure: Bronchoscopy Indications: Obtain specimens for culture and/or other diagnostic studies  Procedure Details: ET Tube Size: ET Tube secured at lip (cm): Bite block in place: Yes In preparation for procedure, Patient hyper-oxygenated with 100 % FiO2 Airway entered and the following bronchi were examined: Bronchi.   Bronchoscope removed.  , Patient placed back on 100% FiO2 at conclusion of procedure.    Evaluation BP 107/67 mmHg  Pulse 63  Temp(Src) 96.4 F (35.8 C) (Core (Comment))  Resp 14  Ht 5\' 8"  (1.727 m)  Wt 173 lb 15.1 oz (78.9 kg)  BMI 26.45 kg/m2  SpO2 100% Breath Sounds:Clear and Diminished O2 sats: stable throughout Patient's Current Condition: stable Specimens:  BAL sent by RN Complications: No apparent complications Patient did tolerate procedure well.   Irish Elders Royal 05/18/2015, 12:43 PM

## 2015-05-18 NOTE — Progress Notes (Signed)
ANTIBIOTIC CONSULT NOTE - Follow up  Pharmacy Consult for Rocephin Indication: Sepsis  No Known Allergies  Patient Measurements: Height: 5\' 8"  (172.7 cm) Weight: 173 lb 15.1 oz (78.9 kg) IBW/kg (Calculated) : 68.4  Vital Signs: Temp: 96.4 F (35.8 C) (10/31 1030) Temp Source: Core (Comment) (10/31 0400) BP: 84/47 mmHg (10/31 1030) Pulse Rate: 57 (10/31 1030) Intake/Output from previous day: 10/30 0701 - 10/31 0700 In: 2458.2 [P.O.:105; I.V.:1636.2; NG/GT:382; IV Piggyback:300] Out: 2625 [Urine:338] Intake/Output from this shift: Total I/O In: 268.6 [I.V.:188.6; NG/GT:80] Out: 326 [Urine:47; Other:279]  Labs:  Recent Labs  05/16/15 0415  05/17/15 0500 05/17/15 1600 05/18/15 0415  WBC 20.4*  --  20.2*  --  20.5*  HGB 10.6*  --  9.4*  --  9.0*  PLT 152  --  146*  --  146*  CREATININE 1.75*  < > 1.47* 1.05 1.31*  < > = values in this interval not displayed. Estimated Creatinine Clearance: 57.3 mL/min (by C-G formula based on Cr of 1.31). No results for input(s): VANCOTROUGH, VANCOPEAK, VANCORANDOM, GENTTROUGH, GENTPEAK, GENTRANDOM, TOBRATROUGH, TOBRAPEAK, TOBRARND, AMIKACINPEAK, AMIKACINTROU, AMIKACIN in the last 72 hours.   Microbiology: Recent Results (from the past 720 hour(s))  Culture, blood (routine x 2)     Status: None   Collection Time: 05/04/15  2:50 AM  Result Value Ref Range Status   Specimen Description BLOOD RIGHT ARM  Final   Special Requests BOTTLES DRAWN AEROBIC AND ANAEROBIC [place]  Final   Culture   Final    NO GROWTH 5 DAYS Performed at [facility]    Report Status 05/09/2015 FINAL  Final  Culture, blood (routine x 2)     Status: None   Collection Time: 05/04/15  2:55 AM  Result Value Ref Range Status   Specimen Description BLOOD RIGHT HAND  Final   Special Requests BOTTLES DRAWN AEROBIC ONLY 10CC  Final   Culture   Final    NO GROWTH 5 DAYS Performed at [facility]    Report Status 05/09/2015 FINAL  Final  Culture, Urine      Status: None   Collection Time: 05/04/15 10:57 PM  Result Value Ref Range Status   Specimen Description URINE, RANDOM  Final   Special Requests NONE  Final   Culture   Final    NO GROWTH 1 DAY Performed at [facility]    Report Status 05/06/2015 FINAL  Final  Culture, Urine     Status: None   Collection Time: 05/11/15  4:21 PM  Result Value Ref Range Status   Specimen Description URINE, CLEAN CATCH  Final   Special Requests Normal  Final   Culture   Final    NO GROWTH 2 DAYS Performed at [facility]    Report Status 05/13/2015 FINAL  Final  Tissue culture     Status: None   Collection Time: 05/12/15  9:50 AM  Result Value Ref Range Status   Specimen Description BIOPSY BONE MARROW  Final   Special Requests NONE  Final   Gram Stain   Final    MODERATE WBC PRESENT,BOTH PMN AND MONONUCLEAR NO SQUAMOUS EPITHELIAL CELLS SEEN NO ORGANISMS SEEN Performed at [facility]    Culture   Final    FEW ESCHERICHIA COLI Performed at [facility]    Report Status 05/14/2015 FINAL  Final   Organism ID, Bacteria ESCHERICHIA COLI  Final      Susceptibility   Escherichia coli - MIC*  AMPICILLIN >=32 RESISTANT Resistant     AMPICILLIN/SULBACTAM >=32 RESISTANT Resistant     CEFAZOLIN >=64 RESISTANT Resistant     CEFEPIME <=1 SENSITIVE Sensitive     CEFTAZIDIME <=1 SENSITIVE Sensitive     CEFTRIAXONE <=1 SENSITIVE Sensitive     CIPROFLOXACIN <=0.25 SENSITIVE Sensitive     GENTAMICIN <=1 SENSITIVE Sensitive     IMIPENEM <=0.25 SENSITIVE Sensitive     PIP/TAZO 8 SENSITIVE Sensitive     TOBRAMYCIN <=1 SENSITIVE Sensitive     TRIMETH/SULFA Value in next row Sensitive      <=20 SENSITIVE(NOTE)    * FEW ESCHERICHIA COLI  Fungus culture w smear     Status: None (Preliminary result)   Collection Time: 05/12/15  9:50 AM  Result Value Ref Range Status   Specimen Description BIOPSY BONE MARROW  Final   Special Requests NONE  Final   Fungal Smear   Final     NO YEAST OR FUNGAL ELEMENTS SEEN Performed at [facility]    Culture   Final    CULTURE IN PROGRESS FOR FOUR WEEKS Performed at [facility]    Report Status PENDING  Incomplete  AFB culture with smear     Status: None (Preliminary result)   Collection Time: 05/12/15  9:50 AM  Result Value Ref Range Status   Specimen Description BIOPSY BONE MARROW  Final   Special Requests NONE  Final   Acid Fast Smear   Final    NO ACID FAST BACILLI SEEN Performed at [facility]    Culture   Final    CULTURE WILL BE EXAMINED FOR 6 WEEKS BEFORE ISSUING A FINAL REPORT Performed at [facility]    Report Status PENDING  Incomplete  MRSA PCR Screening     Status: None   Collection Time: 05/13/15  2:59 PM  Result Value Ref Range Status   MRSA by PCR NEGATIVE NEGATIVE Final    Comment:        The GeneXpert MRSA Assay (FDA approved for NASAL specimens only), is one component of a comprehensive MRSA colonization surveillance program. It is not intended to diagnose MRSA infection nor to guide or monitor treatment for MRSA infections.   Culture, blood (x 2)     Status: None   Collection Time: 05/13/15  4:05 PM  Result Value Ref Range Status   Specimen Description BLOOD RIGHT ARM  Final   Special Requests BOTTLES DRAWN AEROBIC AND ANAEROBIC  5CC  Final   Culture  Setup Time   Final    GRAM NEGATIVE RODS AEROBIC BOTTLE ONLY CRITICAL RESULT CALLED TO, READ BACK BY AND VERIFIED WITH: L FREI,RN AT [id number] 05/14/15 BY L BENFIELD    Culture   Final    ENTEROBACTER CLOACAE Performed at [facility]    Report Status 05/16/2015 FINAL  Final   Organism ID, Bacteria ENTEROBACTER CLOACAE  Final      Susceptibility   Enterobacter cloacae - MIC*    CEFAZOLIN >=64 RESISTANT Resistant     CEFEPIME <=1 SENSITIVE Sensitive     CEFTAZIDIME <=1 SENSITIVE Sensitive     CEFTRIAXONE <=1 SENSITIVE Sensitive     CIPROFLOXACIN <=0.25 SENSITIVE Sensitive     GENTAMICIN <=1  SENSITIVE Sensitive     IMIPENEM 1 SENSITIVE Sensitive     TRIMETH/SULFA <=20 SENSITIVE Sensitive     PIP/TAZO <=4 SENSITIVE Sensitive     * ENTEROBACTER CLOACAE  Culture, blood (x 2)  Status: None   Collection Time: 05/13/15  4:24 PM  Result Value Ref Range Status   Specimen Description BLOOD RIGHT ARM  Final   Special Requests BOTTLES DRAWN AEROBIC AND ANAEROBIC  5CC  Final   Culture  Setup Time   Final    GRAM NEGATIVE RODS IN BOTH AEROBIC AND ANAEROBIC BOTTLES CRITICAL RESULT CALLED TO, READ BACK BY AND VERIFIED WITH: S TROTS @0600  05/14/15 MKELLY    Culture   Final    ENTEROBACTER CLOACAE SUSCEPTIBILITIES PERFORMED ON PREVIOUS CULTURE WITHIN THE LAST 5 DAYS. Performed at [facility]    Report Status 05/16/2015 FINAL  Final  Ova and parasite examination     Status: None   Collection Time: 05/14/15  4:25 PM  Result Value Ref Range Status   Specimen Description STOOL  Final   Special Requests NONE  Final   Ova and parasites   Final    NO OVA OR PARASITES SEEN Performed at [facility]    Report Status 05/15/2015 FINAL  Final  Stool culture     Status: None   Collection Time: 05/14/15  4:25 PM  Result Value Ref Range Status   Specimen Description STOOL  Final   Special Requests NONE  Final   Culture   Final    NO SALMONELLA, SHIGELLA, CAMPYLOBACTER, YERSINIA, OR E.COLI 0157:[id number] ISOLATED Performed at [facility]    Report Status 05/18/2015 FINAL  Final    Assessment: 48 yoM on IV antibiotics for sepsis d/t cellulitis, enterobacter bacteremia, and E.Coli in bone marrow, remains intubated, requiring pressors, and on CRRT.  Pharmacy dosing primaxin.   10/16 >> Clinda >> 10/16 10/17 >>Vanc >> 10/21, 10/26 >> 10/27 10/17 >> Zosyn >> 10/21, 10/26 >> 10/27 10/27 >> Primaxin >> 10/31 10/31 >> Rocephin >>  10/17 bloodx2: NGF 10/17 urine: NGF 10/24 urine: NGF   10/25 AFB: NGTD 10/25 Fungus: NGTD 10/25 BM biopsy: Few E.Coli (R amp, unasyn,  cefazolin, S cefepime, ceftaz, CTX, cipro, gent, primaxin, zosyn, tobra, bactrim) 10/26 blood: 2/2 enterobacter cloacae (R cefazoline, S cefepime, ceftaz, CTX, cipro, gent, primaxin, zosyn, bactrim) 10/27 O/P: negative 10/27 stool culture: no suspicious colonies, continuing to hold 10/29 blood 2/2: canceled - per lab, couldn't collect blood  Today, 05/18/2015: Temp: remains afebrile WBC: bumped up (on steroids) Renal: CRRT, effluent rate 57ml/kg/hr (target 20-19ml/kg/hr to equate to CrCl 25-50), oliguric 0.1-0.2 ml/kg/hr. PCT and LA high, now trending down  Goal of Therapy:  Eradication of infection  Plan:  Switching abx to Rocephin per ID.  Start Rocephin 2g IV q24h. Follow up renal fxn, culture results, and clinical course.  Romeo Rabon, PharmD, pager [phone]. 05/18/2015,11:42 AM.

## 2015-05-18 NOTE — Progress Notes (Addendum)
[facility] for Infectious Disease   Reason for visit: Follow up on GNR sepsis, eosinophilia  Interval History: he remains intubated, sedated, on levophed. Culture with Enterobacter, E coli in bone marrow.  Bilirubin increasing, LFTs decreasing.    Physical Exam: Constitutional:  Filed Vitals:   05/18/15 1030  BP: 84/47  Pulse: 57  Temp: 96.4 F (35.8 C)  Resp: 14  Patient remains intubated, sedated Eyes: icteric HENT: ET in place; no oral lesions noted Respiratory: on vent Cardiovascular: Tachy RR Skin: no rashes  Unable to be assessed due to mental status  Lab Results  Component Value Date   WBC 20.5* 05/18/2015   HGB 9.0* 05/18/2015   HCT 25.7* 05/18/2015   MCV 108.0* 05/18/2015   PLT 146* 05/18/2015    Lab Results  Component Value Date   CREATININE 1.31* 05/18/2015   BUN 20 05/18/2015   NA 137 05/18/2015   K 3.7 05/18/2015   CL 104 05/18/2015   CO2 27 05/18/2015    Lab Results  Component Value Date   ALT 262* 05/18/2015   AST 386* 05/18/2015   ALKPHOS 186* 05/18/2015     Microbiology: Recent Results (from the past 240 hour(s))  Culture, Urine     Status: None   Collection Time: 05/11/15  4:21 PM  Result Value Ref Range Status   Specimen Description URINE, CLEAN CATCH  Final   Special Requests Normal  Final   Culture   Final    NO GROWTH 2 DAYS Performed at [facility]    Report Status 05/13/2015 FINAL  Final  Tissue culture     Status: None   Collection Time: 05/12/15  9:50 AM  Result Value Ref Range Status   Specimen Description BIOPSY BONE MARROW  Final   Special Requests NONE  Final   Gram Stain   Final    MODERATE WBC PRESENT,BOTH PMN AND MONONUCLEAR NO SQUAMOUS EPITHELIAL CELLS SEEN NO ORGANISMS SEEN Performed at [facility]    Culture   Final    FEW ESCHERICHIA COLI Performed at [facility]    Report Status 05/14/2015 FINAL  Final   Organism ID, Bacteria ESCHERICHIA COLI  Final   Susceptibility   Escherichia coli - MIC*    AMPICILLIN >=32 RESISTANT Resistant     AMPICILLIN/SULBACTAM >=32 RESISTANT Resistant     CEFAZOLIN >=64 RESISTANT Resistant     CEFEPIME <=1 SENSITIVE Sensitive     CEFTAZIDIME <=1 SENSITIVE Sensitive     CEFTRIAXONE <=1 SENSITIVE Sensitive     CIPROFLOXACIN <=0.25 SENSITIVE Sensitive     GENTAMICIN <=1 SENSITIVE Sensitive     IMIPENEM <=0.25 SENSITIVE Sensitive     PIP/TAZO 8 SENSITIVE Sensitive     TOBRAMYCIN <=1 SENSITIVE Sensitive     TRIMETH/SULFA Value in next row Sensitive      <=20 SENSITIVE(NOTE)    * FEW ESCHERICHIA COLI  Fungus culture w smear     Status: None (Preliminary result)   Collection Time: 05/12/15  9:50 AM  Result Value Ref Range Status   Specimen Description BIOPSY BONE MARROW  Final   Special Requests NONE  Final   Fungal Smear   Final    NO YEAST OR FUNGAL ELEMENTS SEEN Performed at [facility]    Culture   Final    CULTURE IN PROGRESS FOR FOUR WEEKS Performed at [facility]    Report Status PENDING  Incomplete  AFB culture with smear  Status: None (Preliminary result)   Collection Time: 05/12/15  9:50 AM  Result Value Ref Range Status   Specimen Description BIOPSY BONE MARROW  Final   Special Requests NONE  Final   Acid Fast Smear   Final    NO ACID FAST BACILLI SEEN Performed at [facility]    Culture   Final    CULTURE WILL BE EXAMINED FOR 6 WEEKS BEFORE ISSUING A FINAL REPORT Performed at [facility]    Report Status PENDING  Incomplete  MRSA PCR Screening     Status: None   Collection Time: 05/13/15  2:59 PM  Result Value Ref Range Status   MRSA by PCR NEGATIVE NEGATIVE Final    Comment:        The GeneXpert MRSA Assay (FDA approved for NASAL specimens only), is one component of a comprehensive MRSA colonization surveillance program. It is not intended to diagnose MRSA infection nor to guide or monitor treatment for MRSA infections.   Culture,  blood (x 2)     Status: None   Collection Time: 05/13/15  4:05 PM  Result Value Ref Range Status   Specimen Description BLOOD RIGHT ARM  Final   Special Requests BOTTLES DRAWN AEROBIC AND ANAEROBIC  5CC  Final   Culture  Setup Time   Final    GRAM NEGATIVE RODS AEROBIC BOTTLE ONLY CRITICAL RESULT CALLED TO, READ BACK BY AND VERIFIED WITH: L FREI,RN AT [id number] 05/14/15 BY L BENFIELD    Culture   Final    ENTEROBACTER CLOACAE Performed at [facility]    Report Status 05/16/2015 FINAL  Final   Organism ID, Bacteria ENTEROBACTER CLOACAE  Final      Susceptibility   Enterobacter cloacae - MIC*    CEFAZOLIN >=64 RESISTANT Resistant     CEFEPIME <=1 SENSITIVE Sensitive     CEFTAZIDIME <=1 SENSITIVE Sensitive     CEFTRIAXONE <=1 SENSITIVE Sensitive     CIPROFLOXACIN <=0.25 SENSITIVE Sensitive     GENTAMICIN <=1 SENSITIVE Sensitive     IMIPENEM 1 SENSITIVE Sensitive     TRIMETH/SULFA <=20 SENSITIVE Sensitive     PIP/TAZO <=4 SENSITIVE Sensitive     * ENTEROBACTER CLOACAE  Culture, blood (x 2)     Status: None   Collection Time: 05/13/15  4:24 PM  Result Value Ref Range Status   Specimen Description BLOOD RIGHT ARM  Final   Special Requests BOTTLES DRAWN AEROBIC AND ANAEROBIC  5CC  Final   Culture  Setup Time   Final    GRAM NEGATIVE RODS IN BOTH AEROBIC AND ANAEROBIC BOTTLES CRITICAL RESULT CALLED TO, READ BACK BY AND VERIFIED WITH: S TROTS @0600  05/14/15 MKELLY    Culture   Final    ENTEROBACTER CLOACAE SUSCEPTIBILITIES PERFORMED ON PREVIOUS CULTURE WITHIN THE LAST 5 DAYS. Performed at [facility]    Report Status 05/16/2015 FINAL  Final  Ova and parasite examination     Status: None   Collection Time: 05/14/15  4:25 PM  Result Value Ref Range Status   Specimen Description STOOL  Final   Special Requests NONE  Final   Ova and parasites   Final    NO OVA OR PARASITES SEEN Performed at [facility]    Report Status 05/15/2015 FINAL  Final  Stool  culture     Status: None   Collection Time: 05/14/15  4:25 PM  Result Value Ref Range Status   Specimen Description STOOL  Final  Special Requests NONE  Final   Culture   Final    NO SALMONELLA, SHIGELLA, CAMPYLOBACTER, YERSINIA, OR E.COLI 0157:[id number] ISOLATED Performed at [facility]    Report Status 05/18/2015 FINAL  Final    Impression:  1. Enterococcal sepsis 2. Eosiniphilia of unknown significance, ? Hypereosinophilic syndrome 3. E coli in bone marrow 4. hyperbilirubinemia  Plan: 1. Will narrow antibiotics to ceftriaxone 2. Await chromosomes  3. Will discuss with allergy/heme.

## 2015-05-18 NOTE — Progress Notes (Signed)
Contacted [facility] with critical APTT>200. Advised by Dr. Oletta Darter to retest the APPT. 2nd test resulted with APTT>200.  Paged Nephrology and spoke to Dr. Jimmy Footman.  Advised to stop heparin for 1 hour.  After hour has surpassed, the heparin is to be restarted at 500 units less than the previous value. The APTT level should then be re-checked after 4 hours from the restart time.

## 2015-05-18 NOTE — Progress Notes (Signed)
Name: [name] MRN: [id number] DOB: [birth year]    ADMISSION DATE:  05/03/2015 CONSULTATION DATE:  05/13/15  REFERRING MD :  Dr. Doyle Askew   CHIEF COMPLAINT:  Fever of unknown origin, tachypnea/tachycardia  BRIEF PATIENT DESCRIPTION:  59 y/o M, homeless male (lives in a hotel) with PMH of HTN, NSTEMI, HLD, arthritis, anxiety / depression, former polysubstance abuse with cardiomyopathy (EF20%), CKD IV and medical non-compliance who presented to [facility] on 10/16 with complaints of neck/face swelling with hives.The patient has also had intermittent fever and rash.  Interestingly, WBC reduced when antibiotics were stopped.  Eosinophils rose to 4.1K on 10/25.  On 10/26, he developed tachycardia, tachypnea and fever to 101. on 10/26 he underwent a bone marrow biopsy.  He developed fever, tachypnea and tachycardia on 10/26 and was transferred to ICU.    SIGNIFICANT EVENTS  10/13  Seen in ER for N/V, sore throat, diarrhea & rash 10/16  Admit to [facility] for facial swelling, cough with frothy yellow sputum, sore throat and hives. 10/26 bone marrow bx done as part of evaluation for eosinophilia. Developed worsening distress later that afternoon. intubated 10/27 cvvh started 10/28-10/31: Bone marrow bx from 26th= ecoli, BCx2 showing Enterobacter. ID following. By 10/30 pressors weaning. Still on CRRT. 10/31 attempting weaning efforts.    STUDIES:  10/13  RUQ [place] - mild gallbladder wall thickening, no gallstones observed 10/16  CXR - no acute infiltrate 10/16  CT Soft tissue Neck - asymmetric enlargement of R submandibular gland relative to the left is non-specific but could be due to infection, inflammation or tumor.  Negative for abscess, diffuse subcutaneous edema 10/17  MRI Soft Tissue Neck - motion degraded study, no discrete mass, grossly similar subcutaneous edema involving the bilateral submandibular spaces (nonspecific but must consider infection/cellulitis), no discrete abscess  10/21  CXR - no  acute infiltrate 10/21  CT Abd/Pelvis - diffuse body wall edema suggestive of anasarca, mesenteric edema but no overt ascites, no acute intra-abdominal abnormalities, stable prostate gland enlargement and small hiatal hernia 10/26 TTE - LV & RV moderately dilated. EF 25-30% w/ diffuse hypokinesis of LV. Moderate MR. No pericardial effusion. 10/26 Renal US - Medical renal disease. Right pleural effusion. No hydronephrosis. 10/27 CT chest, abd, pelvis - Bibasilar atelectasis/ early infiltrate, anasarca. 10/29 RUQ [place] - no stones. No perichole fluid. CBD 65m. No focal liver lesion. Right pleural effusion.  SUBJECTIVE: Sedated on vent   VITAL SIGNS: Temp:  [95.5 F (35.3 C)-97.7 F (36.5 C)] 96.8 F (36 C) (10/31 0630) Pulse Rate:  [47-104] 48 (10/31 0630) Resp:  [12-23] 20 (10/31 0823) BP: (79-123)/(45-83) 109/65 mmHg (10/31 0823) SpO2:  [100 %] 100 % (10/31 0630) FiO2 (%):  [30 %] 30 % (10/31 0823) Weight:  [78.9 kg (173 lb 15.1 oz)] 78.9 kg (173 lb 15.1 oz) (10/31 0354)  PHYSICAL EXAMINATION: General:  Sedated. Currently on CVVHD. No distress. Warming blanket in place  Integument:  Warm & dry. Flaking epidermal rash on extremities with exposed skin. HEENT:  Endotracheal tube in place. Bilateral scleral edema. Cardiovascular:  Regular rhythm. Anasarca improving. Currently normal sinus rhythm on telemetry. Pulmonary: clear breath sounds bilaterally. Symmetric chest wall rise on ventilator.  Abdomen: Soft. Hypoactive bowel sounds. Protuberant. Musculoskeletal:  No joint deformity or effusion appreciated. Normal muscle bulk. Neurological: Pupils reactive. Currently sedated. On fentanyl drip.   Recent Labs Lab 05/17/15 0500 05/17/15 1600 05/18/15 0415  NA 136 137 137  K 4.4 4.0 3.7  CL 105 105 104  CO2 27  28 27  BUN _0 CREATININE 1.47* 1.05 1.31*  GLUCOSE 145* 112* 130*    Recent Labs Lab 05/16/15 0415 05/17/15 0500 05/18/15 0415  HGB 10.6* 9.4* 9.0*  HCT 30.7*  27.6* 25.7*  WBC 20.4* 20.2* 20.5*  PLT 152 146* 146*   CBG (last 3)   Recent Labs  05/17/15 2336 05/18/15 0351 05/18/15 0736  GLUCAP 100* 118* 131*     ASSESSMENT / PLAN:  PULMONARY A:  Acute Hypoxic Respiratory Failure - Improving. Multifactorial in setting of septic shock & cardiomyopathy.  P:   Vent bundle 8 cc/kg tidal volume Will decrease sedation & attempt SBT as pressor requirements minimal If fails cont PSV trials   CARDIOVASCULAR R IJ HD catheter 10/26 >>  Lt I J CVL 10/27>> A:   Septic Shock - Improving. Moderate mitral regurgitation Dilated cardiomyopathy  H/O CAD H/O HTN  P:  Continuing Levo gtt to maintain MAP>65-->weaning to off Monitoring on Telemetry Aspirin 81 mg by mouth daily Plavix 75 mg by mouth daily Holding on ACE inhibitor given acute renal failure & hypotension  RENAL A:   Acute renal failure - On CVVHD. Lactic Acidosis - Improving. Hyperkalemia - Resolved.   P:   Awaiting Urine Eos. Continuing CVVHD. Electrolyte monitoring per Nephrology Dc lactic acid monitoring   GASTROINTESTINAL A:   Shock Liver - Improving transaminitis. CT abd/pelvis neg. Questionable EtOH Liver Disease  P:   Dietitian consult for tube feeding recommendations; will cont  Repeat LFTs tomorrow morning Pepcid IV daily   HEMATOLOGIC A:   Peripheral eosinophilia - Resolved. Bone marrow biopsy not c/w with hypereosinophilic syndrome. IgE 5125. Possible Churg-Strauss - Although no pulmonary infiltrates and ANCA is negative. Possible Zosyn Allergy Thrombocytopenia - Possible consumption with 2 clotted filters-->stable Leukocytosis - Secondary to steroids & sepsis  P:  Repeat IgE 10/29  pending  Monitoring platelet count daily with CBC Trending leukocytosis daily with CBC Continuing Solu-Cortef IV q12hr SCDs Holding heparin subcutaneous given aPTT >200 on heparin for CVVHD  INFECTIOUS A:   Septic Shock - Shock improving. Enterobacter  Bacteremia: TTE 10/26 without evidence of endocarditis E coli Osteomyelitis - from bone marrow biopsy-->not sure where this came from.  HIV negative Autoimmune negative to date.   Vanco 10/26 - 10/27 Zosyn 10/26 - 10/26 Clinda x 1 10/17  Vanco 10/17 - 10/21  Zosyn 10/17 - 10/21 P:   Repeat Blood Cultures(10/29)>>> Appreciate ID assistance Primaxin 10/26 >>   DERMATOLOGY:  A:  Rash w/ Dermal Peeling - Unclear etiology.   P: Consider skin biopsy for progression. Solu-Cortef 164m IV q12hr  ENDOCRINE A: Hypoglycemia - Improving w/ some Novolog insulin requirement.  P:   Continuing dextrose drip D5 NS; decrease to 25 ml/hr and Hydrocortisone Continuing Accuchecks q4hr  NEUROLOGIC A:  No focal issues on exam 10/29. Sedated for vent tolerance  P:   RASS goal: -1 Fentanyl gtt Versed IV prn  FAMILY  - Updates: No family at bedside today.  - Inter-disciplinary family meet or Palliative Care meeting due by:  05/18/15   TODAY'S SUMMARY: 59year old complex male with septic shock secondary to Escherichia coli osteomyelitis as well as enterobacter bacteremia.Repeat BCs pending. Patient continuing on Primaxin. Eosinophilia has resolved. Continuing patient on Solu-Cortef every 12 hours. Awaiting repeat serum IgE level. Decreasing infusion rate for dextrose. Consulted dietitian for initiation of tube feeds and they have been started. vasopressor requirement have improved.Start spontaneous breathing trial pending further improved hemodynamics and can likely undergo spontaneous breathing  trial tomorrow morning. Repeat blood culture from 10/29 pending.  Erick Colace ACNP-BC [place] Pager # [phone] OR # [phone] if no answer  05/18/2015, 9:07 AM    Attending:  I have seen and examined the patient with nurse practitioner/resident and agree with the note above.   Mr. Buys has an unexplained syndrome characterized by fever, hypereosinophilia with  high IgE, respiratory complaints.    On exam:  Sedated on vent Vent supported breaths, breath sounds clear Bradycardic, no clear murmur BS in frequent,  Scaling skin noted  Labs reviewed, extensive work up IgE still high  Principal Problem:   Sepsis due to cellulitis [facility]) Active Problems:   Homelessness   Neck swelling   Acute renal failure superimposed on stage 3 chronic kidney disease (HCC)   Chronic combined systolic (congestive) and diastolic (congestive) heart failure (HCC)   Benign essential HTN   History of non-ST elevation myocardial infarction (NSTEMI)   Hyponatremia   Abnormal LFTs   Polysubstance abuse   Noncompliance   Leukocytosis   Cellulitis of neck   Dysphagia   Elevated LFTs   Submandibular gland infection   Urticaria   Rash and nonspecific skin eruption   Dry mouth   Dyspnea   FUO (fever of unknown origin)   Acute respiratory failure (HCC)   Renal failure (ARF), acute on chronic (HCC)   Sepsis ([place])   Plan Full vent support Continue CRRT Decrease sedation Plan BAL today> cell count, O&P, culture  Discussed with Dr. Linus Salmons My cc time 40 minutes   Roselie Awkward, MD [place] [facility] Pager: [phone] Cell: [phone] After 3pm or if no response, call [phone]

## 2015-05-18 NOTE — Op Note (Signed)
PCCM Video Bronchoscopy Procedure Note  The patient was informed of the risks (including but not limited to bleeding, infection, respiratory failure, lung injury, tooth/oral injury) and benefits of the procedure and gave consent, see chart.  Indication: Respiratory failure, etiology uncertain  Post Procedure Diagnosis: Tracheobronchitis  Location: [facility] ICU room 1230  Condition pre procedure: Critically ill, on vent  Medications for procedure: fentanyl gtt and bolus  Procedure description: The bronchoscope was introduced through the endotracheal tube and passed to the bilateral lungs to the level of the subsegmental bronchi throughout the tracheobronchial tree.  Airway exam revealed thick secretions throughout the tracheobronchial tree bilaterally.  There was acute appearing inflammation throughout without airway obstruction or airway lesion.   Procedures performed:  Bronchoalveolar lavage right middle lobe  Specimens sent:  BAL > AFB culture, bacterial culture, fungal culture, cell count with diff, O&P, cytology  Condition post procedure: critically ill on vent   EBL: minimal  Complications: none immediate  Roselie Awkward, MD [place] [facility] Pager: [phone] Cell: [phone] After 3pm or if no response, call [phone]

## 2015-05-18 NOTE — Progress Notes (Signed)
Date: May 18, 2015 Chart reviewed for concurrent status and case management needs. Will continue to follow patient for changes and needs: Velva Harman, RN, BSN, [place]   [phone]

## 2015-05-18 NOTE — Progress Notes (Signed)
Act obtained. Level at 202. No change made to heparin dosage.

## 2015-05-18 NOTE — Progress Notes (Signed)
Pt failed SBT due to apenea. Returned to full vent support

## 2015-05-18 NOTE — Progress Notes (Deleted)
[facility] Progress Note   Subjective: not responding. I/O's about even  Filed Vitals:   05/18/15 1000 05/18/15 1015 05/18/15 1030 05/18/15 1141  BP: 89/56  84/47 107/67  Pulse: 60 55 57 63  Temp: 96.6 F (35.9 C) 96.4 F (35.8 C) 96.4 F (35.8 C)   TempSrc:      Resp: 16 14 14 14   Height:      Weight:      SpO2: 100% 99% 99% 100%   Exam: On vent sedated, unresponsive No jvd Chest clear bilat ant and lat RRR no MRG Abd soft dec'd BS no gross ascites GU foley brown urine in small amts 1-2+ bilat pitting edema of the LE's and UE's Neuro is not responsive, sedated  UA 10/26 - 100 prot, 3-6 wbc, 7-10 rbc UNa 19  UCr 236 Urine M-spike is negative CXR 10/29 no active disease  Summary: 60 AAM w hx CKD III, HTN, dilated CM (ischemic), polysubstance abuse and homelessness admitted with recent ED visit 10.13 for n/v/, sore throat, diarrhea and rash rx'd as allergic rxn.  Admitted then on 10.16 for neck/ facial swelling, cough, sore throat and hives.  Eosinophilia on admission and ^LFT's of unclear etiology.  Had BM bx on 10/25.  BM cx's grew ECOli and blood cx's ended up growing enterobacter cloacae.    Assessment: 1 AKI due to sepsis/ shock / ATN. Remains on pressors. Doubt AIN. On CRRT since 10/27, heparin added.  Lytes ok. Minimal signs of recovery. 2 CKD stage III, baseline creat 1.4- 1.8 3 ID on abx for GNR in blood and bone marrow cx's, on IV Rocephin now 4 Septic shock - better 5 ^LFT's supportive care 6 Hx dilated CM EF 25%  Plan - cont CRRT   Kelly Splinter MD [facility] pager [phone]    cell [phone] 05/18/2015, 1:25 PM    Recent Labs Lab 05/17/15 0500 05/17/15 1600 05/18/15 0415  NA 136 137 137  K 4.4 4.0 3.7  CL 105 105 104  CO2 27 28 27   GLUCOSE 145* 112* 130*  BUN 19 14 20   CREATININE 1.47* 1.05 1.31*  CALCIUM 7.0* 7.3* 7.6*  PHOS 3.3 2.7 2.9    Recent Labs Lab 05/15/15 0400  05/16/15 0415  05/17/15 0500  05/17/15 1600 05/18/15 0415  AST 981*  --  572*  --   --   --  386*  ALT 621*  --  422*  --   --   --  262*  ALKPHOS 211*  --  170*  --   --   --  186*  BILITOT 9.0*  --  9.4*  --   --   --  12.6*  PROT 5.7*  --  5.2*  --   --   --  5.6*  ALBUMIN 2.0*  2.1*  < > 1.8*  < > 1.8* 1.8* 1.8*  1.8*  < > = values in this interval not displayed.  Recent Labs Lab 05/16/15 0415 05/17/15 0500 05/18/15 0415  WBC 20.4* 20.2* 20.5*  NEUTROABS 17.0* 14.6* 13.4*  HGB 10.6* 9.4* 9.0*  HCT 30.7* 27.6* 25.7*  MCV 107.0* 103.8* 108.0*  PLT 152 146* 146*   . antiseptic oral rinse  7 mL Mouth Rinse QID  . aspirin  81 mg Oral Daily  . cefTRIAXone (ROCEPHIN)  IV  2 g Intravenous Q24H  . chlorhexidine gluconate  15 mL Mouth Rinse BID  . clopidogrel  75 mg Oral Daily  . diphenhydrAMINE  25 mg Per Tube 4 times per day  . famotidine  20 mg Oral Daily  . feeding supplement (NEPRO CARB STEADY)  1,000 mL Per Tube Q24H  . fentaNYL (SUBLIMAZE) injection  50 mcg Intravenous Once  . hydrocortisone sodium succinate  100 mg Intravenous Q12H  . insulin aspart  0-9 Units Subcutaneous 6 times per day  . mirabegron ER  25 mg Oral Daily  . sodium chloride  3 mL Intravenous Q12H   . dextrose 5 % and 0.45% NaCl 25 mL/hr at 05/18/15 1300  . fentaNYL infusion INTRAVENOUS 100 mcg/hr (05/18/15 1300)  . heparin 10,000 units/ 20 mL infusion syringe 700 Units/hr (05/18/15 1300)  . norepinephrine (LEVOPHED) Adult infusion 4.053 mcg/min (05/18/15 1300)  . dialysis replacement fluid (prismasate) 200 mL/hr at 05/17/15 1220  . dialysis replacement fluid (prismasate) 400 mL/hr at 05/18/15 0149  . dialysate (PRISMASATE) 1,500 mL/hr at 05/18/15 1012   [CANCELED] Place/Maintain arterial line **AND** sodium chloride, acetaminophen **OR** acetaminophen, fentaNYL, heparin, heparin, heparin, LORazepam, ondansetron **OR** ondansetron (ZOFRAN) IV, sodium chloride, technetium TC 46M mebrofenin

## 2015-05-18 NOTE — Progress Notes (Signed)
CRITICAL VALUE ALERT  Critical value received: APTT > 200    Date of notification:  05-18-15  Time of notification:  04:20  Critical value read back:Yes.    Nurse who received alert:  Idora Brosious D. Neiko Trivedi  MD notified (1st page):  [facility]  Time of first page:  04:20  MD notified (2nd page): N/A  Time of second page: N/A  Responding MD:  Dr. Oletta Darter  Time MD responded:  04:20

## 2015-05-18 NOTE — Progress Notes (Signed)
[place] Progress Note Patient Name: [name] DOB: April 13, 1956 MRN: [id number]   Date of Service  05/18/2015  HPI/Events of Note  PTT > 200. ACT = 206,  eICU Interventions  Will repeat PTT now.      Intervention Category Intermediate Interventions: Coagulopathy - evaluation and management  Sommer,Steven Eugene 05/18/2015, 5:33 AM

## 2015-05-18 NOTE — Progress Notes (Signed)
Fentanyl decreased from 75 mcg to 25 mcg.  RT attempted SBT about 20 minutes later and px did not pass due to no patient effort.  Spoke to NP for CCM and sedation was cut off completely.  RT attempted SBT approximately one hour later and patient did not pass again due to no px effort. Cheryl Flash RN

## 2015-05-19 DIAGNOSIS — D721 Eosinophilia, unspecified: Secondary | ICD-10-CM | POA: Insufficient documentation

## 2015-05-19 DIAGNOSIS — R778 Other specified abnormalities of plasma proteins: Secondary | ICD-10-CM | POA: Insufficient documentation

## 2015-05-19 DIAGNOSIS — R6521 Severe sepsis with septic shock: Secondary | ICD-10-CM

## 2015-05-19 DIAGNOSIS — R7989 Other specified abnormal findings of blood chemistry: Secondary | ICD-10-CM

## 2015-05-19 DIAGNOSIS — A419 Sepsis, unspecified organism: Secondary | ICD-10-CM | POA: Insufficient documentation

## 2015-05-19 DIAGNOSIS — IMO0002 Reserved for concepts with insufficient information to code with codable children: Secondary | ICD-10-CM | POA: Insufficient documentation

## 2015-05-19 DIAGNOSIS — R7881 Bacteremia: Secondary | ICD-10-CM | POA: Insufficient documentation

## 2015-05-19 DIAGNOSIS — A498 Other bacterial infections of unspecified site: Secondary | ICD-10-CM | POA: Insufficient documentation

## 2015-05-19 LAB — GLUCOSE, CAPILLARY
GLUCOSE-CAPILLARY: 117 mg/dL — AB (ref 65–99)
GLUCOSE-CAPILLARY: 118 mg/dL — AB (ref 65–99)
GLUCOSE-CAPILLARY: 119 mg/dL — AB (ref 65–99)
Glucose-Capillary: 97 mg/dL (ref 65–99)
Glucose-Capillary: 118 mg/dL — ABNORMAL HIGH (ref 65–99)
Glucose-Capillary: 109 mg/dL — ABNORMAL HIGH (ref 65–99)

## 2015-05-19 LAB — CBC WITH DIFFERENTIAL/PLATELET
BASOS ABS: 0 10*3/uL (ref 0.0–0.1)
BASOS PCT: 0 %
EOS PCT: 0 %
Eosinophils Absolute: 0 10*3/uL (ref 0.0–0.7)
HEMATOCRIT: 26.3 % — AB (ref 39.0–52.0)
HEMOGLOBIN: 8.7 g/dL — AB (ref 13.0–17.0)
LYMPHS PCT: 26 %
Lymphs Abs: 4.2 10*3/uL — ABNORMAL HIGH (ref 0.7–4.0)
MCH: 35.5 pg — ABNORMAL HIGH (ref 26.0–34.0)
MCHC: 33.1 g/dL (ref 30.0–36.0)
MCV: 107.3 fL — ABNORMAL HIGH (ref 78.0–100.0)
MONOS PCT: 7 %
Monocytes Absolute: 1.1 10*3/uL — ABNORMAL HIGH (ref 0.1–1.0)
NEUTROS PCT: 67 %
Neutro Abs: 10.8 10*3/uL — ABNORMAL HIGH (ref 1.7–7.7)
Platelets: 127 10*3/uL — ABNORMAL LOW (ref 150–400)
RBC: 2.45 MIL/uL — ABNORMAL LOW (ref 4.22–5.81)
RDW: 21.4 % — ABNORMAL HIGH (ref 11.5–15.5)
WBC: 16.1 10*3/uL — ABNORMAL HIGH (ref 4.0–10.5)
nRBC: 1 /100 WBC — ABNORMAL HIGH

## 2015-05-19 LAB — RENAL FUNCTION PANEL
ALBUMIN: 1.8 g/dL — AB (ref 3.5–5.0)
ALBUMIN: 1.9 g/dL — AB (ref 3.5–5.0)
ANION GAP: 5 (ref 5–15)
Anion gap: 3 — ABNORMAL LOW (ref 5–15)
BUN: 19 mg/dL (ref 6–20)
BUN: 17 mg/dL (ref 6–20)
CALCIUM: 7.9 mg/dL — AB (ref 8.9–10.3)
CHLORIDE: 105 mmol/L (ref 101–111)
CO2: 30 mmol/L (ref 22–32)
CO2: 27 mmol/L (ref 22–32)
Calcium: 7.9 mg/dL — ABNORMAL LOW (ref 8.9–10.3)
Chloride: 105 mmol/L (ref 101–111)
Creatinine, Ser: 1.26 mg/dL — ABNORMAL HIGH (ref 0.61–1.24)
Creatinine, Ser: 1.2 mg/dL (ref 0.61–1.24)
GFR calc Af Amer: >60 mL/min (ref >60)
GFR calc Af Amer: >60 mL/min (ref >60)
GFR calc non Af Amer: 60 mL/min — ABNORMAL LOW (ref >60)
GLUCOSE: 108 mg/dL — AB (ref 65–99)
Glucose, Bld: 123 mg/dL — ABNORMAL HIGH (ref 65–99)
PHOSPHORUS: 3.1 mg/dL (ref 2.5–4.6)
POTASSIUM: 3.7 mmol/L (ref 3.5–5.1)
POTASSIUM: 3.6 mmol/L (ref 3.5–5.1)
Phosphorus: 2.9 mg/dL (ref 2.5–4.6)
SODIUM: 137 mmol/L (ref 135–145)
Sodium: 138 mmol/L (ref 135–145)

## 2015-05-19 LAB — POCT ACTIVATED CLOTTING TIME
ACTIVATED CLOTTING TIME: 208 s
ACTIVATED CLOTTING TIME: 208 s
Activated Clotting Time: 202 seconds
Activated Clotting Time: 196 seconds
Activated Clotting Time: 196 seconds
Activated Clotting Time: 208 seconds

## 2015-05-19 LAB — MAGNESIUM: Magnesium: 2.4 mg/dL (ref 1.7–2.4)

## 2015-05-19 LAB — APTT: aPTT: >200 seconds (ref 24–37)

## 2015-05-19 MED ORDER — MIDAZOLAM HCL 2 MG/2ML IJ SOLN
INTRAMUSCULAR | Status: AC
  Administered 2015-05-19: 4 mg
  Filled 2015-05-19: qty 4

## 2015-05-19 MED ORDER — MIDAZOLAM HCL 5 MG/ML IJ SOLN: 4.0000 mg | Freq: Once | INTRAMUSCULAR | Status: AC

## 2015-05-19 MED ORDER — MIDAZOLAM HCL 2 MG/2ML IJ SOLN
1.0000 mg | INTRAMUSCULAR | Status: DC | PRN
  Administered 2015-05-19 – 2015-05-20 (×2): 1 mg via INTRAVENOUS
  Administered 2015-05-20: 2 mg via INTRAVENOUS
  Filled 2015-05-19 (×6): qty 2

## 2015-05-19 MED ORDER — DEXMEDETOMIDINE HCL IN NACL 200 MCG/50ML IV SOLN
0.4000 ug/kg/h | INTRAVENOUS | Status: DC
  Administered 2015-05-19: 0.4 ug/kg/h via INTRAVENOUS
  Administered 2015-05-19: 0.5 ug/kg/h via INTRAVENOUS
  Filled 2015-05-19 (×2): qty 50

## 2015-05-19 MED ORDER — NEPRO/CARBSTEADY PO LIQD
1000.0000 mL | ORAL | Status: DC
  Administered 2015-05-19 – 2015-05-20 (×2): 1000 mL
  Filled 2015-05-19 (×2): qty 1000

## 2015-05-19 MED ORDER — DIPHENHYDRAMINE HCL 12.5 MG/5ML PO ELIX
25.0000 mg | ORAL_SOLUTION | Freq: Three times a day (TID) | ORAL | Status: DC
  Administered 2015-05-19 – 2015-05-22 (×8): 25 mg
  Filled 2015-05-19 (×8): qty 10

## 2015-05-19 MED ORDER — SODIUM CHLORIDE 0.9 % IV SOLN
25.0000 ug/h | INTRAVENOUS | Status: DC
  Administered 2015-05-19: 100 ug/h via INTRAVENOUS
  Filled 2015-05-19: qty 50

## 2015-05-19 MED ORDER — HYDROCORTISONE NA SUCCINATE PF 100 MG IJ SOLR
50.0000 mg | Freq: Two times a day (BID) | INTRAMUSCULAR | Status: DC
  Administered 2015-05-19 – 2015-05-20 (×2): 50 mg via INTRAVENOUS
  Filled 2015-05-19 (×2): qty 2

## 2015-05-19 NOTE — Progress Notes (Signed)
Attempted SBT while Pt off sedation. Pt became extremely agitated, required sedation bolus to prevent self extubation. RNs at bedside. Pt sedated and returned to full support. MD notified.

## 2015-05-19 NOTE — Progress Notes (Signed)
[place] for Infectious Disease    Subjective: Intubated, sedated  Antibiotics:  Anti-infectives    Start     Dose/Rate Route Frequency Ordered Stop   05/18/15 1300  cefTRIAXone (ROCEPHIN) 2 g in dextrose 5 % 50 mL IVPB     2 g 100 mL/hr over 30 Minutes Intravenous Every 24 hours 05/18/15 1143     05/15/15 1600  vancomycin (VANCOCIN) IVPB 1000 mg/200 mL premix  Status:  Discontinued     1,000 mg 200 mL/hr over 60 Minutes Intravenous Every 48 hours 05/13/15 1507 05/13/15 2028   05/14/15 2200  vancomycin (VANCOCIN) IVPB 1000 mg/200 mL premix  Status:  Discontinued     1,000 mg 200 mL/hr over 60 Minutes Intravenous Every 24 hours 05/14/15 0633 05/14/15 0640   05/14/15 1200  piperacillin-tazobactam (ZOSYN) IVPB 2.25 g  Status:  Discontinued     2.25 g 100 mL/hr over 30 Minutes Intravenous 4 times per day 05/14/15 0910 05/14/15 1008   05/14/15 1200  piperacillin-tazobactam (ZOSYN) IVPB 3.375 g  Status:  Discontinued     3.375 g 100 mL/hr over 30 Minutes Intravenous 4 times per day 05/14/15 1008 05/14/15 1028   05/14/15 1200  imipenem-cilastatin (PRIMAXIN) 500 mg in sodium chloride 0.9 % 100 mL IVPB  Status:  Discontinued     500 mg 200 mL/hr over 30 Minutes Intravenous 4 times per day 05/14/15 1028 05/18/15 1127   05/13/15 2200  piperacillin-tazobactam (ZOSYN) IVPB 3.375 g  Status:  Discontinued     3.375 g 12.5 mL/hr over 240 Minutes Intravenous 3 times per day 05/13/15 1507 05/14/15 0910   05/13/15 2200  vancomycin (VANCOCIN) IVPB 1000 mg/200 mL premix  Status:  Discontinued     1,000 mg 200 mL/hr over 60 Minutes Intravenous Every 48 hours 05/13/15 2028 05/14/15 0631   05/13/15 1430  piperacillin-tazobactam (ZOSYN) IVPB 3.375 g  Status:  Discontinued     3.375 g 100 mL/hr over 30 Minutes Intravenous  Once 05/13/15 1428 05/13/15 2028   05/13/15 1430  vancomycin (VANCOCIN) IVPB 1000 mg/200 mL premix  Status:  Discontinued     1,000 mg 200 mL/hr over 60  Minutes Intravenous  Once 05/13/15 1428 05/13/15 2028   05/07/15 0700  vancomycin (VANCOCIN) IVPB 1000 mg/200 mL premix  Status:  Discontinued     1,000 mg 200 mL/hr over 60 Minutes Intravenous Every 24 hours 05/07/15 0611 05/08/15 1438   05/04/15 1100  piperacillin-tazobactam (ZOSYN) IVPB 3.375 g  Status:  Discontinued     3.375 g 12.5 mL/hr over 240 Minutes Intravenous 3 times per day 05/04/15 1003 05/08/15 1438   05/04/15 0600  vancomycin (VANCOCIN) IVPB 750 mg/150 ml premix  Status:  Discontinued     750 mg 150 mL/hr over 60 Minutes Intravenous Every 24 hours 05/04/15 0557 05/07/15 0610   05/03/15 2200  clindamycin (CLEOCIN) IVPB 600 mg  Status:  Discontinued     600 mg 100 mL/hr over 30 Minutes Intravenous 3 times per day 05/03/15 1524 05/04/15 0225   05/03/15 1330  clindamycin (CLEOCIN) IVPB 600 mg     600 mg 100 mL/hr over 30 Minutes Intravenous  Once 05/03/15 1326 05/03/15 1457      Medications: Scheduled Meds: . antiseptic oral rinse  7 mL Mouth Rinse QID  . aspirin  81 mg Oral Daily  . cefTRIAXone (ROCEPHIN)  IV  2 g Intravenous Q24H  . chlorhexidine gluconate  15 mL Mouth  Rinse BID  . clopidogrel  75 mg Oral Daily  . diphenhydrAMINE  25 mg Per Tube 3 times per day  . famotidine  20 mg Oral Daily  . hydrocortisone sodium succinate  50 mg Intravenous Q12H  . insulin aspart  0-9 Units Subcutaneous 6 times per day  . sodium chloride  3 mL Intravenous Q12H   Continuous Infusions: . dextrose 5 % and 0.45% NaCl 25 mL/hr at 05/19/15 1200  . feeding supplement (NEPRO CARB STEADY) 1,000 mL (05/19/15 1500)  . fentaNYL infusion INTRAVENOUS 100 mcg/hr (05/19/15 1530)  . heparin 10,000 units/ 20 mL infusion syringe 950 Units/hr (05/19/15 0740)  . norepinephrine (LEVOPHED) Adult infusion Stopped (05/19/15 1410)  . dialysis replacement fluid (prismasate) 200 mL/hr at 05/18/15 1437  . dialysis replacement fluid (prismasate) 400 mL/hr at 05/19/15 0438  . dialysate (PRISMASATE) 1,500  mL/hr at 05/19/15 1511   PRN Meds:.[CANCELED] Place/Maintain arterial line **AND** sodium chloride, acetaminophen **OR** acetaminophen, fentaNYL, heparin, heparin, heparin, midazolam, ondansetron **OR** ondansetron (ZOFRAN) IV, sodium chloride, technetium TC 30M mebrofenin    Objective: Weight change: -2 lb 13.9 oz (-1.3 kg)  Intake/Output Summary (Last 24 hours) at 05/19/15 1553 Last data filed at 05/19/15 1526  Gross per 24 hour  Intake 1550.85 ml  Output   1482 ml  Net  68.85 ml   Blood pressure 101/64, pulse 115, temperature 97.2 F (36.2 C), temperature source Core (Comment), resp. rate 16, height '5\' 8"'  (1.727 m), weight 171 lb 1.2 oz (77.6 kg), SpO2 100 %. Temp:  [95.4 F (35.2 C)-97.9 F (36.6 C)] 97.2 F (36.2 C) (11/01 1522) Pulse Rate:  [44-142] 115 (11/01 1522) Resp:  [13-31] 16 (11/01 1500) BP: (82-139)/(49-90) 101/64 mmHg (11/01 1500) SpO2:  [99 %-100 %] 100 % (11/01 1522) FiO2 (%):  [30 %] 30 % (11/01 1400) Weight:  [171 lb 1.2 oz (77.6 kg)] 171 lb 1.2 oz (77.6 kg) (11/01 0400)  Physical Exam: General: intubated sedated HEENT: icteric sclera,  EOMI CVS tachycardic, regular rate, normal r,  no murmur rubs or gallops Chest:  rhonchi Abdomen: soft  nondistended, normal bowel sounds, Extremities: 2+ edema Skin: rash he had previously has improved with bathing int he ICU it seems Neuro: nonfocal  CBC: CBC Latest Ref Rng 05/19/2015 05/18/2015 05/17/2015  WBC 4.0 - 10.5 K/uL 16.1(H) 20.5(H) 20.2(H)  Hemoglobin 13.0 - 17.0 g/dL 8.7(L) 9.0(L) 9.4(L)  Hematocrit 39.0 - 52.0 % 26.3(L) 25.7(L) 27.6(L)  Platelets 150 - 400 K/uL 127(L) 146(L) 146(L)       BMET  Recent Labs  05/18/15 1600 05/19/15 0449  NA 135 138  K 3.8 3.7  CL 105 105  CO2 27 30  GLUCOSE 130* 108*  BUN 18 19  CREATININE 1.14 1.26*  CALCIUM 7.5* 7.9*     Liver Panel   Recent Labs  05/18/15 0415 05/18/15 1600 05/19/15 0449  PROT 5.6*  --   --   ALBUMIN 1.8*  1.8* 1.8* 1.8*    AST 386*  --   --   ALT 262*  --   --   ALKPHOS 186*  --   --   BILITOT 12.6*  --   --   BILIDIR 7.7*  --   --   IBILI 4.9*  --   --        Sedimentation Rate No results for input(s): ESRSEDRATE in the last 72 hours. C-Reactive Protein No results for input(s): CRP in the last 72 hours.  Micro Results: Recent Results (from the past  720 hour(s))  Culture, blood (routine x 2)     Status: None   Collection Time: 05/04/15  2:50 AM  Result Value Ref Range Status   Specimen Description BLOOD RIGHT ARM  Final   Special Requests BOTTLES DRAWN AEROBIC AND ANAEROBIC [place]  Final   Culture   Final    NO GROWTH 5 DAYS Performed at [facility]    Report Status 05/09/2015 FINAL  Final  Culture, blood (routine x 2)     Status: None   Collection Time: 05/04/15  2:55 AM  Result Value Ref Range Status   Specimen Description BLOOD RIGHT HAND  Final   Special Requests BOTTLES DRAWN AEROBIC ONLY 10CC  Final   Culture   Final    NO GROWTH 5 DAYS Performed at [facility]    Report Status 05/09/2015 FINAL  Final  Culture, Urine     Status: None   Collection Time: 05/04/15 10:57 PM  Result Value Ref Range Status   Specimen Description URINE, RANDOM  Final   Special Requests NONE  Final   Culture   Final    NO GROWTH 1 DAY Performed at [facility]    Report Status 05/06/2015 FINAL  Final  Culture, Urine     Status: None   Collection Time: 05/11/15  4:21 PM  Result Value Ref Range Status   Specimen Description URINE, CLEAN CATCH  Final   Special Requests Normal  Final   Culture   Final    NO GROWTH 2 DAYS Performed at [facility]    Report Status 05/13/2015 FINAL  Final  Tissue culture     Status: None   Collection Time: 05/12/15  9:50 AM  Result Value Ref Range Status   Specimen Description BIOPSY BONE MARROW  Final   Special Requests NONE  Final   Gram Stain   Final    MODERATE WBC PRESENT,BOTH PMN AND MONONUCLEAR NO SQUAMOUS EPITHELIAL CELLS  SEEN NO ORGANISMS SEEN Performed at [facility]    Culture   Final    FEW ESCHERICHIA COLI Performed at [facility]    Report Status 05/14/2015 FINAL  Final   Organism ID, Bacteria ESCHERICHIA COLI  Final      Susceptibility   Escherichia coli - MIC*    AMPICILLIN >=32 RESISTANT Resistant     AMPICILLIN/SULBACTAM >=32 RESISTANT Resistant     CEFAZOLIN >=64 RESISTANT Resistant     CEFEPIME <=1 SENSITIVE Sensitive     CEFTAZIDIME <=1 SENSITIVE Sensitive     CEFTRIAXONE <=1 SENSITIVE Sensitive     CIPROFLOXACIN <=0.25 SENSITIVE Sensitive     GENTAMICIN <=1 SENSITIVE Sensitive     IMIPENEM <=0.25 SENSITIVE Sensitive     PIP/TAZO 8 SENSITIVE Sensitive     TOBRAMYCIN <=1 SENSITIVE Sensitive     TRIMETH/SULFA Value in next row Sensitive      <=20 SENSITIVE(NOTE)    * FEW ESCHERICHIA COLI  Fungus culture w smear     Status: None (Preliminary result)   Collection Time: 05/12/15  9:50 AM  Result Value Ref Range Status   Specimen Description BIOPSY BONE MARROW  Final   Special Requests NONE  Final   Fungal Smear   Final    NO YEAST OR FUNGAL ELEMENTS SEEN Performed at [facility]    Culture   Final    CULTURE IN PROGRESS FOR FOUR WEEKS Performed at [facility]    Report Status PENDING  Incomplete  AFB culture with smear     Status: None (Preliminary result)   Collection Time: 05/12/15  9:50 AM  Result Value Ref Range Status   Specimen Description BIOPSY BONE MARROW  Final   Special Requests NONE  Final   Acid Fast Smear   Final    NO ACID FAST BACILLI SEEN Performed at [facility]    Culture   Final    CULTURE WILL BE EXAMINED FOR 6 WEEKS BEFORE ISSUING A FINAL REPORT Performed at [facility]    Report Status PENDING  Incomplete  MRSA PCR Screening     Status: None   Collection Time: 05/13/15  2:59 PM  Result Value Ref Range Status   MRSA by PCR NEGATIVE NEGATIVE Final    Comment:        The GeneXpert MRSA Assay  (FDA approved for NASAL specimens only), is one component of a comprehensive MRSA colonization surveillance program. It is not intended to diagnose MRSA infection nor to guide or monitor treatment for MRSA infections.   Culture, blood (x 2)     Status: None   Collection Time: 05/13/15  4:05 PM  Result Value Ref Range Status   Specimen Description BLOOD RIGHT ARM  Final   Special Requests BOTTLES DRAWN AEROBIC AND ANAEROBIC  5CC  Final   Culture  Setup Time   Final    GRAM NEGATIVE RODS AEROBIC BOTTLE ONLY CRITICAL RESULT CALLED TO, READ BACK BY AND VERIFIED WITH: L FREI,RN AT 5277 05/14/15 BY L BENFIELD    Culture   Final    ENTEROBACTER CLOACAE Performed at [facility]    Report Status 05/16/2015 FINAL  Final   Organism ID, Bacteria ENTEROBACTER CLOACAE  Final      Susceptibility   Enterobacter cloacae - MIC*    CEFAZOLIN >=64 RESISTANT Resistant     CEFEPIME <=1 SENSITIVE Sensitive     CEFTAZIDIME <=1 SENSITIVE Sensitive     CEFTRIAXONE <=1 SENSITIVE Sensitive     CIPROFLOXACIN <=0.25 SENSITIVE Sensitive     GENTAMICIN <=1 SENSITIVE Sensitive     IMIPENEM 1 SENSITIVE Sensitive     TRIMETH/SULFA <=20 SENSITIVE Sensitive     PIP/TAZO <=4 SENSITIVE Sensitive     * ENTEROBACTER CLOACAE  Culture, blood (x 2)     Status: None   Collection Time: 05/13/15  4:24 PM  Result Value Ref Range Status   Specimen Description BLOOD RIGHT ARM  Final   Special Requests BOTTLES DRAWN AEROBIC AND ANAEROBIC  5CC  Final   Culture  Setup Time   Final    GRAM NEGATIVE RODS IN BOTH AEROBIC AND ANAEROBIC BOTTLES CRITICAL RESULT CALLED TO, READ BACK BY AND VERIFIED WITH: S TROTS '@0600'  05/14/15 MKELLY    Culture   Final    ENTEROBACTER CLOACAE SUSCEPTIBILITIES PERFORMED ON PREVIOUS CULTURE WITHIN THE LAST 5 DAYS. Performed at [facility]    Report Status 05/16/2015 FINAL  Final  Ova and parasite examination     Status: None   Collection Time: 05/14/15  4:25 PM  Result  Value Ref Range Status   Specimen Description STOOL  Final   Special Requests NONE  Final   Ova and parasites   Final    NO OVA OR PARASITES SEEN Performed at [facility]    Report Status 05/15/2015 FINAL  Final  Stool culture     Status: None   Collection Time: 05/14/15  4:25 PM  Result Value Ref Range Status  Specimen Description STOOL  Final   Special Requests NONE  Final   Culture   Final    NO SALMONELLA, SHIGELLA, CAMPYLOBACTER, YERSINIA, OR E.COLI 0157:H7 ISOLATED Performed at [facility]    Report Status 05/18/2015 FINAL  Final  Culture, respiratory (NON-Expectorated)     Status: None (Preliminary result)   Collection Time: 05/18/15 12:30 PM  Result Value Ref Range Status   Specimen Description BRONCHIAL ALVEOLAR LAVAGE  Final   Special Requests Normal  Final   Gram Stain   Final    ABUNDANT WBC PRESENT, PREDOMINANTLY PMN NO SQUAMOUS EPITHELIAL CELLS SEEN NO ORGANISMS SEEN Performed at [facility]    Culture PENDING  Incomplete   Report Status PENDING  Incomplete  Fungus Culture with Smear     Status: None (Preliminary result)   Collection Time: 05/18/15 12:30 PM  Result Value Ref Range Status   Specimen Description BRONCHIAL ALVEOLAR LAVAGE  Final   Special Requests Normal  Final   Fungal Smear   Final    NO YEAST OR FUNGAL ELEMENTS SEEN Performed at [facility]    Culture   Final    CULTURE IN PROGRESS FOR FOUR WEEKS Performed at [facility]    Report Status PENDING  Incomplete  AFB culture with smear     Status: None (Preliminary result)   Collection Time: 05/18/15 12:30 PM  Result Value Ref Range Status   Specimen Description BRONCHIAL ALVEOLAR LAVAGE  Final   Special Requests Normal  Final   Acid Fast Smear   Final    NO ACID FAST BACILLI SEEN Performed at [facility]    Culture   Final    CULTURE WILL BE EXAMINED FOR 6 WEEKS BEFORE ISSUING A FINAL REPORT Performed at [facility]     Report Status PENDING  Incomplete    Studies/Results: No results found.    Assessment/Plan:  INTERVAL HISTORY:   05/18/15: pt sp bronchoscopy   Principal Problem:   Sepsis due to cellulitis [facility]) Active Problems:   Homelessness   Neck swelling   Acute renal failure superimposed on stage 3 chronic kidney disease (HCC)   Chronic combined systolic (congestive) and diastolic (congestive) heart failure (HCC)   Benign essential HTN   History of non-ST elevation myocardial infarction (NSTEMI)   Hyponatremia   Abnormal LFTs   Polysubstance abuse   Noncompliance   Leukocytosis   Cellulitis of neck   Dysphagia   Elevated LFTs   Submandibular gland infection   Urticaria   Rash and nonspecific skin eruption   Dry mouth   Dyspnea   FUO (fever of unknown origin)   Acute respiratory failure ([place])   Renal failure (ARF), acute on chronic ([place])   Sepsis ([place])   Endotracheal tube present    [name] is a 59 y.o. male with  Enterobacter bacteremia and septic shock with multi-organ failure, on pressors, CVVDH, with  previous E coli on bone marrow biopsy, eosinophilia of unkown cause, hyperbirubinemia  #1 Septic shock due Gram negative Enterobacter sepsis: --continue rocephin 2 grams daily  #2 Bronchial secretions on BAL: followup cultures  #3 E coli on bone marrow biopsy: not clear how this came about  #4 Eosinophilia: still a mystery, O&P negative, strongyloides antibody negative in serum and now eosinophilia resolved  #5 Hyperbilirubinemia: is this due to an acute liver injury?  --   LOS: 15 days   Alcide Evener 05/19/2015, 3:53 PM

## 2015-05-19 NOTE — Progress Notes (Signed)
ACT obtained. Current level is 196 which is in therapeutic range. No changes made.

## 2015-05-19 NOTE — Progress Notes (Signed)
Nutrition Follow-up  INTERVENTION:   Advance Nepro @ 20 ml/hr by 10 ml to 30 ml/hr. Continue to advance by 10 ml every 8 hours to goal rate of 55 ml/hr. Tube feeding regimen provides 2476 kcal, 122 grams protein, and 960 mL free water.  RD to continue to monitor  NUTRITION DIAGNOSIS:   Inadequate oral intake related to inability to eat as evidenced by NPO status.  Ongoing.  GOAL:   Patient will meet greater than or equal to 90% of their needs  Not meeting.  MONITOR:   Vent status, Labs, Weight trends, TF tolerance, Skin, I & O's  ASSESSMENT:   59 year old male with past medical history of hypertension, chronic combined CHF, CAD, NSTEMI (on aspirin and plavix), CKD stage 3 who presented to [facility] ED with reports of neck swelling which has been there for some time but has gotten worse in past few days.   Patient in room with RN at bedside. Per RN, MD is okay with tube feeding advancement. Will advance to 30 ml/hr then advance further every 8 hours to goal rate of 55 ml/hr.  Patient is currently intubated on ventilator support MV: 7.5 L/min Temp (24hrs), Avg:96.7 F (35.9 C), Min:95.4 F (35.2 C), Max:97.9 F (36.6 C)  Propofol: none  Labs reviewed: Elevated Creatinine Mg/Phos WNL  Diet Order:  Diet NPO time specified  Skin:    Incisions/wounds to: low back, R arm, coccyx; R buttocks laceration.  Last BM:  10/29  Height:   Ht Readings from Last 1 Encounters:  05/18/15 5\' 8"  (1.727 m)    Weight:   Wt Readings from Last 1 Encounters:  05/19/15 171 lb 1.2 oz (77.6 kg)    Ideal Body Weight:  70 kg  BMI:  Body mass index is 26.02 kg/(m^2).  Estimated Nutritional Needs:   Kcal:  2340-2730  Protein:  95-105g  Fluid:  1.2-1.5 L/day  EDUCATION NEEDS:   No education needs identified at this time  Clayton Bibles, MS, RD, LDN Pager: [phone] After Hours Pager: [phone]

## 2015-05-19 NOTE — Progress Notes (Signed)
ACT obtained. Current level is 202. No changes made.

## 2015-05-19 NOTE — Progress Notes (Signed)
Name: [name] MRN: [id number] DOB: 1956-02-19    ADMISSION DATE:  05/03/2015 CONSULTATION DATE:  05/13/15  REFERRING MD :  Dr. Doyle Askew   CHIEF COMPLAINT:  Fever of unknown origin, tachypnea/tachycardia  BRIEF PATIENT DESCRIPTION:  59 y/o M, homeless male (lives in a hotel) with PMH of HTN, NSTEMI, HLD, arthritis, anxiety / depression, former polysubstance abuse with cardiomyopathy (EF20%), CKD IV and medical non-compliance who presented to [facility] on 10/16 with complaints of neck/face swelling with hives.The patient has also had intermittent fever and rash.  Interestingly, WBC reduced when antibiotics were stopped.  Eosinophils rose to 4.1K on 10/25.  On 10/26, he developed tachycardia, tachypnea and fever to 101. on 10/26 he underwent a bone marrow biopsy.  He developed fever, tachypnea and tachycardia on 10/26 and was transferred to ICU.    SIGNIFICANT EVENTS  10/13  Seen in ER for N/V, sore throat, diarrhea & rash 10/16  Admit to [facility] for facial swelling, cough with frothy yellow sputum, sore throat and hives. 10/26 bone marrow bx done as part of evaluation for eosinophilia. Developed worsening distress later that afternoon. intubated 10/27 cvvh started 10/28-10/31: Bone marrow bx from 26th= ecoli, BCx2 showing Enterobacter. ID following. By 10/30 pressors weaning. Still on CRRT. 10/31 attempting weaning efforts.  10/31: BAL to eval to help explain: unexplained syndrome characterized by fever, hypereosinophilia with high IgE. Primaxin changed to Rocephin by ID 11/1: sedated. No spont effort during weaning. Fentanyl gtt stopped.   STUDIES:  10/13  RUQ [place] - mild gallbladder wall thickening, no gallstones observed 10/16  CXR - no acute infiltrate 10/16  CT Soft tissue Neck - asymmetric enlargement of R submandibular gland relative to the left is non-specific but could be due to infection, inflammation or tumor.  Negative for abscess, diffuse subcutaneous edema 10/17  MRI Soft Tissue  Neck - motion degraded study, no discrete mass, grossly similar subcutaneous edema involving the bilateral submandibular spaces (nonspecific but must consider infection/cellulitis), no discrete abscess  10/21  CXR - no acute infiltrate 10/21  CT Abd/Pelvis - diffuse body wall edema suggestive of anasarca, mesenteric edema but no overt ascites, no acute intra-abdominal abnormalities, stable prostate gland enlargement and small hiatal hernia 10/26 TTE - LV & RV moderately dilated. EF 25-30% w/ diffuse hypokinesis of LV. Moderate MR. No pericardial effusion. 10/26 Renal US - Medical renal disease. Right pleural effusion. No hydronephrosis. 10/27 CT chest, abd, pelvis - Bibasilar atelectasis/ early infiltrate, anasarca. 10/29 RUQ [place] - no stones. No perichole fluid. CBD 49m. No focal liver lesion. Right pleural effusion.  SUBJECTIVE: Sedated on vent   VITAL SIGNS: Temp:  [95.4 F (35.2 C)-97.3 F (36.3 C)] 97 F (36.1 C) (11/01 0900) Pulse Rate:  [44-85] 61 (11/01 0900) Resp:  [13-18] 13 (11/01 0900) BP: (82-139)/(47-90) 93/54 mmHg (11/01 0900) SpO2:  [99 %-100 %] 100 % (11/01 0900) FiO2 (%):  [30 %] 30 % (11/01 0900) Weight:  [77.6 kg (171 lb 1.2 oz)] 77.6 kg (171 lb 1.2 oz) (11/01 0400)  PHYSICAL EXAMINATION: General:  Sedated. Currently on CVVHD. No distress. Warming blanket in place  Integument:  Warm & dry. Flaking epidermal rash on extremities with exposed skin. HEENT:  Endotracheal tube in place. Bilateral scleral edema. Cardiovascular:  Regular rhythm. Anasarca improving. Currently normal sinus rhythm on telemetry. Pulmonary: clear breath sounds bilaterally. Symmetric chest wall rise on ventilator. No accessory muscle use  Abdomen: Soft. Hypoactive bowel sounds. Protuberant. Musculoskeletal:  No joint deformity or effusion appreciated. Normal muscle bulk.  Neurological: Pupils reactive. Currently sedated. On fentanyl drip.   Recent Labs Lab 05/18/15 0415 05/18/15 1600  05/19/15 0449  NA 137 135 138  K 3.7 3.8 3.7  CL 104 105 105  CO2 '27 27 30  ' BUN '20 18 19  ' CREATININE 1.31* 1.14 1.26*  GLUCOSE 130* 130* 108*    Recent Labs Lab 05/17/15 0500 05/18/15 0415 05/19/15 0449  HGB 9.4* 9.0* 8.7*  HCT 27.6* 25.7* 26.3*  WBC 20.2* 20.5* 16.1*  PLT 146* 146* 127*   CBG (last 3)   Recent Labs  05/18/15 2357 05/19/15 0502 05/19/15 0749  GLUCAP 119* 97 118*     ASSESSMENT / PLAN:  PULMONARY A:  Acute Hypoxic Respiratory Failure - Improving. Multifactorial in setting of septic shock & cardiomyopathy. Completely apneic during PSV trial.  P:   Vent bundle 8 cc/kg tidal volume Will d/c fentanyl gtt. Change to PRN Will try PSV trials when able to trigger vent    CARDIOVASCULAR R IJ HD catheter 10/26 >>  Lt I J CVL 10/27>> A:   Septic Shock - Improving. ? Some degree of pressor dependence r/t sedation  Moderate mitral regurgitation Dilated cardiomyopathy  H/O CAD H/O HTN  P:  Continuing Levo gtt to maintain MAP>65-->weaning to off Monitoring on Telemetry Aspirin 81 mg by mouth daily Plavix 75 mg by mouth daily Holding on ACE inhibitor given acute renal failure & hypotension  RENAL A:   Acute renal failure - On CVVHD. Lactic Acidosis - Improving. Hyperkalemia - Resolved.   P:   Awaiting Urine Eos. Continuing CVVHD. Electrolyte monitoring per Nephrology   GASTROINTESTINAL A:   Shock Liver - Improving transaminitis. CT abd/pelvis neg. Questionable EtOH Liver Disease  P:   Cont tubefeeds Repeat LFTs 11/2 Pepcid IV daily   HEMATOLOGIC A:   Peripheral eosinophilia - Resolved. Bone marrow biopsy not c/w with hypereosinophilic syndrome. IgE 5125. Possible Churg-Strauss - Although no pulmonary infiltrates and ANCA is negative. Possible Zosyn Allergy Thrombocytopenia - Possible consumption with 2 clotted filters-->stable Leukocytosis - Secondary to steroids & sepsis  P:  Repeat IgE 10/29  pending  Monitoring platelet  count daily with CBC Trending leukocytosis daily with CBC Continuing Solu-Cortef IV q12hr SCDs Holding heparin subcutaneous given aPTT >200 on heparin for CVVHD F/u fungal culture   INFECTIOUS A:   Septic Shock - Shock improving. Enterobacter Bacteremia: TTE 10/26 without evidence of endocarditis E coli Osteomyelitis - from bone marrow biopsy-->not sure where this came from.  HIV negative Autoimmune negative to date. Stool neg    Vanco 10/26 - 10/27 Zosyn 10/26 - 10/26 Clinda x 1 10/17  Vanco 10/17 - 10/21  Zosyn 10/17 - 10/21 P:   Repeat Blood Cultures(10/29)>>> Appreciate ID assistance Primaxin 10/26 >>10/31 Rocephin 10/31>>>   DERMATOLOGY:  A:  Rash w/ Dermal Peeling - Unclear etiology.   P: Consider skin biopsy for progression. Solu-Cortef decrease to 53m q12 ENDOCRINE A: Hypoglycemia - Improving w/ some Novolog insulin requirement.  P:   Continuing dextrose drip D5 NS; decrease to 25 ml/hr and Hydrocortisone Continuing Accuchecks q4hr  NEUROLOGIC A:  No focal issues on exam 10/29. Sedated for vent tolerance  P:   RASS goal: -1 Fentanyl gtt Versed IV prn  FAMILY  - Updates: No family at bedside today.  - Inter-disciplinary family meet or Palliative Care meeting due by:  05/18/15   TODAY'S SUMMARY: 59year old complex male with septic shock secondary to Escherichia coli osteomyelitis as well as enterobacter bacteremia.Repeat BCs pending. Patient continuing on  Primaxin. Eosinophilia has resolved. Continuing patient on Solu-Cortef every 12 hours. Awaiting repeat serum IgE level. Awaiting BAL and fungal cultures from bronch to possibly help explain eosinophilia. Hemodynamically stable. Sedation biggest barrier to weaning  Erick Colace ACNP-BC [place] Pager # [phone] OR # [phone] if no answer  05/19/2015, 9:42 AM

## 2015-05-19 NOTE — Progress Notes (Signed)
Pt sedation was turned off at 0822 this morning and had remained off since this time. Pt aroused while RRT in room with initiation wake up assessment at 11:30. Pt became extremely agitated and combative (flailing legs and sitting up in bed, HR 140's and RR in the low 30's). MD was notified and 4mg  Versed given as well as new order for Precedex drip. After initiation of sedation V/S returned to pts normal and pt became calm. Will continue to monitor patient closely.

## 2015-05-19 NOTE — Progress Notes (Signed)
15cc of fentanyl wasted in sink with [place]

## 2015-05-20 DIAGNOSIS — A4159 Other Gram-negative sepsis: Principal | ICD-10-CM

## 2015-05-20 DIAGNOSIS — Z992 Dependence on renal dialysis: Secondary | ICD-10-CM

## 2015-05-20 DIAGNOSIS — Z8619 Personal history of other infectious and parasitic diseases: Secondary | ICD-10-CM

## 2015-05-20 DIAGNOSIS — R21 Rash and other nonspecific skin eruption: Secondary | ICD-10-CM

## 2015-05-20 DIAGNOSIS — J984 Other disorders of lung: Secondary | ICD-10-CM

## 2015-05-20 LAB — RENAL FUNCTION PANEL
ALBUMIN: 2.1 g/dL — AB (ref 3.5–5.0)
ANION GAP: 7 (ref 5–15)
Albumin: 1.9 g/dL — ABNORMAL LOW (ref 3.5–5.0)
Anion gap: 3 — ABNORMAL LOW (ref 5–15)
BUN: 19 mg/dL (ref 6–20)
BUN: 17 mg/dL (ref 6–20)
CHLORIDE: 107 mmol/L (ref 101–111)
CHLORIDE: 102 mmol/L (ref 101–111)
CO2: 28 mmol/L (ref 22–32)
CO2: 28 mmol/L (ref 22–32)
CREATININE: 1.25 mg/dL — AB (ref 0.61–1.24)
Calcium: 8.1 mg/dL — ABNORMAL LOW (ref 8.9–10.3)
Calcium: 8.4 mg/dL — ABNORMAL LOW (ref 8.9–10.3)
Creatinine, Ser: 1.13 mg/dL (ref 0.61–1.24)
GFR calc Af Amer: >60 mL/min (ref >60)
GLUCOSE: 111 mg/dL — AB (ref 65–99)
Glucose, Bld: 134 mg/dL — ABNORMAL HIGH (ref 65–99)
PHOSPHORUS: 2.8 mg/dL (ref 2.5–4.6)
PHOSPHORUS: 2.6 mg/dL (ref 2.5–4.6)
POTASSIUM: 3.8 mmol/L (ref 3.5–5.1)
POTASSIUM: 3.7 mmol/L (ref 3.5–5.1)
SODIUM: 137 mmol/L (ref 135–145)
Sodium: 138 mmol/L (ref 135–145)

## 2015-05-20 LAB — MAGNESIUM: MAGNESIUM: 2.3 mg/dL (ref 1.7–2.4)

## 2015-05-20 LAB — GLUCOSE, CAPILLARY
GLUCOSE-CAPILLARY: 119 mg/dL — AB (ref 65–99)
GLUCOSE-CAPILLARY: 92 mg/dL (ref 65–99)
GLUCOSE-CAPILLARY: 83 mg/dL (ref 65–99)
Glucose-Capillary: 106 mg/dL — ABNORMAL HIGH (ref 65–99)
Glucose-Capillary: 122 mg/dL — ABNORMAL HIGH (ref 65–99)
Glucose-Capillary: 127 mg/dL — ABNORMAL HIGH (ref 65–99)

## 2015-05-20 LAB — CBC WITH DIFFERENTIAL/PLATELET
Basophils Absolute: 0 10*3/uL (ref 0.0–0.1)
Basophils Relative: 0 %
EOS ABS: 0 10*3/uL (ref 0.0–0.7)
Eosinophils Relative: 0 %
HCT: 25 % — ABNORMAL LOW (ref 39.0–52.0)
Hemoglobin: 8.3 g/dL — ABNORMAL LOW (ref 13.0–17.0)
LYMPHS ABS: 3.4 10*3/uL (ref 0.7–4.0)
LYMPHS PCT: 22 %
MCH: 35.6 pg — AB (ref 26.0–34.0)
MCHC: 33.2 g/dL (ref 30.0–36.0)
MCV: 107.3 fL — AB (ref 78.0–100.0)
MONO ABS: 1.2 10*3/uL — AB (ref 0.1–1.0)
Monocytes Relative: 8 %
NRBC: 1 /100{WBCs} — AB
Neutro Abs: 10.9 10*3/uL — ABNORMAL HIGH (ref 1.7–7.7)
Neutrophils Relative %: 70 %
PLATELETS: 128 10*3/uL — AB (ref 150–400)
RBC: 2.33 MIL/uL — ABNORMAL LOW (ref 4.22–5.81)
RDW: 21.7 % — AB (ref 11.5–15.5)
WBC: 15.5 10*3/uL — ABNORMAL HIGH (ref 4.0–10.5)

## 2015-05-20 LAB — APTT

## 2015-05-20 LAB — HEPATIC FUNCTION PANEL
ALT: 178 U/L — ABNORMAL HIGH (ref 17–63)
AST: 222 U/L — AB (ref 15–41)
Albumin: 1.8 g/dL — ABNORMAL LOW (ref 3.5–5.0)
Alkaline Phosphatase: 236 U/L — ABNORMAL HIGH (ref 38–126)
BILIRUBIN DIRECT: 6.5 mg/dL — AB (ref 0.1–0.5)
BILIRUBIN INDIRECT: 3.9 mg/dL — AB (ref 0.3–0.9)
BILIRUBIN TOTAL: 10.4 mg/dL — AB (ref 0.3–1.2)
Total Protein: 5.7 g/dL — ABNORMAL LOW (ref 6.5–8.1)

## 2015-05-20 LAB — POCT ACTIVATED CLOTTING TIME: ACTIVATED CLOTTING TIME: 202 s

## 2015-05-20 LAB — CHROMOSOME ANALYSIS, BONE MARROW

## 2015-05-20 MED ORDER — ATROPINE SULFATE 0.1 MG/ML IJ SOLN
INTRAMUSCULAR | Status: AC
  Filled 2015-05-20: qty 10

## 2015-05-20 MED ORDER — HYDROCORTISONE NA SUCCINATE PF 100 MG IJ SOLR
25.0000 mg | Freq: Two times a day (BID) | INTRAMUSCULAR | Status: DC
  Administered 2015-05-20 – 2015-05-22 (×4): 25 mg via INTRAVENOUS
  Filled 2015-05-20 (×4): qty 2

## 2015-05-20 MED ORDER — PROMETHAZINE HCL 25 MG/ML IJ SOLN
12.5000 mg | Freq: Four times a day (QID) | INTRAMUSCULAR | Status: DC | PRN
  Administered 2015-05-20: 12.5 mg via INTRAVENOUS
  Filled 2015-05-20: qty 1

## 2015-05-20 MED ORDER — EPINEPHRINE HCL 0.1 MG/ML IJ SOSY
PREFILLED_SYRINGE | INTRAMUSCULAR | Status: AC
  Filled 2015-05-20: qty 10

## 2015-05-20 MED ORDER — FENTANYL CITRATE (PF) 100 MCG/2ML IJ SOLN
INTRAMUSCULAR | Status: AC
  Filled 2015-05-20: qty 2

## 2015-05-20 MED ORDER — DEXTROSE 5 % IV SOLN
INTRAVENOUS | Status: DC
  Administered 2015-05-20 – 2015-05-22 (×2): via INTRAVENOUS

## 2015-05-20 MED ORDER — FENTANYL CITRATE (PF) 100 MCG/2ML IJ SOLN
25.0000 ug | INTRAMUSCULAR | Status: DC | PRN
  Administered 2015-05-20: 50 ug via INTRAVENOUS
  Administered 2015-05-21 – 2015-05-22 (×2): 100 ug via INTRAVENOUS
  Filled 2015-05-20 (×3): qty 2

## 2015-05-20 MED ORDER — THROMBI-PAD 3"X3" EX PADS
1.0000 | MEDICATED_PAD | Freq: Once | CUTANEOUS | Status: AC
Start: 2015-05-20 — End: 2015-05-20
  Administered 2015-05-20: 1 via TOPICAL
  Filled 2015-05-20: qty 1

## 2015-05-20 NOTE — Progress Notes (Signed)
CRITICAL VALUE ALERT  Critical value received:  APTT > 200  Date of notification:  05-20-15  Time of notification:  05:50  Critical value read back:Yes.    Nurse who received alert:  Lenox Ahr  MD notified (1st page):  [facility]  Time of first page:  05:58  MD notified (2nd page): N/A  Time of second page: N/A  Responding MD:  Dr. Jimmy Footman  Time MD responded:  05:58

## 2015-05-20 NOTE — Progress Notes (Signed)
132ml of 75mcg/ml of fentanyl wasted in sink, witnessed by Charlie Pitter, RN.

## 2015-05-20 NOTE — Progress Notes (Signed)
[facility] Progress Note   Subjective: minimal UOP, off pressors since yesterday.  Some bleeding so hep with CRRT was dc'd. Self-extubated.   Filed Vitals:   05/18/15 1000 05/18/15 1015 05/18/15 1030 05/18/15 1141  BP: 89/56  84/47 107/67  Pulse: 60 55 57 63  Temp: 96.6 F (35.9 C) 96.4 F (35.8 C) 96.4 F (35.8 C)   TempSrc:      Resp: 16 14 14 14   Height:      Weight:      SpO2: 100% 99% 99% 100%   Exam: Extubated groggy but responsive No jvd Chest clear bilat ant and lat RRR no MRG Abd soft dec'd BS no gross ascites GU foley brown urine in small amts 1-2+ bilat pitting edema of the LE's and UE's Neuro is not responsive, sedated  UA 10/26 - 100 prot, 3-6 wbc, 7-10 rbc UNa 19 UCr 236 Urine M-spike is negative CXR 10/29 no active disease  Summary: 7 AAM w hx CKD III, HTN, dilated CM (ischemic), polysubstance abuse and homelessness admitted with recent ED visit 10.13 for n/v/, sore throat, diarrhea and rash rx'd as allergic rxn. Admitted then on 10.16 for neck/ facial swelling, cough, sore throat and hives. Eosinophilia on admission and ^LFT's of unclear etiology. Had BM bx on 10/25. BM cx's grew ECOli and blood cx's ended up growing enterobacter cloacae.    Assessment: 1 AKI due to sepsis/ shock / ATN. Off pressors now, no sign of renal recovery yet. On CRRT since 10/27. Lytes ok. 2 CKD stage III, baseline creat 1.4- 1.8 3 ID on abx for GNR in blood and bone marrow cx's- on Rocephin 4 Shock - improving 5 ^LFT's supportive care 6 Hx dilated CM EF 25%  Plan - cont CRRT; start to pull fluid, -50 to 75 cc/hr. Heparin stopped  Kelly Splinter MD [facility] pager [phone] cell [phone] 05/19/2015, 2:42 PM    Last Labs      Recent Labs Lab 05/17/15 0500 05/17/15 1600 05/18/15 0415  NA 136 137 137  K 4.4 4.0 3.7  CL 105 105 104  CO2 27 28 27   GLUCOSE  145* 112* 130*  BUN 19 14 20   CREATININE 1.47* 1.05 1.31*  CALCIUM 7.0* 7.3* 7.6*  PHOS 3.3 2.7 2.9      Last Labs      Recent Labs Lab 05/15/15 0400  05/16/15 0415  05/17/15 0500 05/17/15 1600 05/18/15 0415  AST 981* --  572* --  --  --  386*  ALT 621* --  422* --  --  --  262*  ALKPHOS 211* --  170* --  --  --  186*  BILITOT 9.0* --  9.4* --  --  --  12.6*  PROT 5.7* --  5.2* --  --  --  5.6*  ALBUMIN 2.0*  2.1* < > 1.8* < > 1.8* 1.8* 1.8*  1.8*  < > = values in this interval not displayed.    Last Labs      Recent Labs Lab 05/16/15 0415 05/17/15 0500 05/18/15 0415  WBC 20.4* 20.2* 20.5*  NEUTROABS 17.0* 14.6* 13.4*  HGB 10.6* 9.4* 9.0*  HCT 30.7* 27.6* 25.7*  MCV 107.0* 103.8* 108.0*  PLT 152 146* 146*     . antiseptic oral rinse 7 mL Mouth Rinse QID  . aspirin 81 mg Oral Daily  . cefTRIAXone (ROCEPHIN) IV 2 g Intravenous Q24H  . chlorhexidine gluconate 15 mL Mouth Rinse BID  . clopidogrel 75  mg Oral Daily  . diphenhydrAMINE 25 mg Per Tube 4 times per day  . famotidine 20 mg Oral Daily  . feeding supplement (NEPRO CARB STEADY) 1,000 mL Per Tube Q24H  . fentaNYL (SUBLIMAZE) injection 50 mcg Intravenous Once  . hydrocortisone sodium succinate 100 mg Intravenous Q12H  . insulin aspart 0-9 Units Subcutaneous 6 times per day  . mirabegron ER 25 mg Oral Daily  . sodium chloride 3 mL Intravenous Q12H   . dextrose 5 % and 0.45% NaCl 25 mL/hr at 05/18/15 1300  . fentaNYL infusion INTRAVENOUS 100 mcg/hr (05/18/15 1300)  . heparin 10,000 units/ 20 mL infusion syringe 700 Units/hr (05/18/15 1300)  . norepinephrine (LEVOPHED) Adult infusion 4.053 mcg/min (05/18/15 1300)  . dialysis replacement fluid (prismasate) 200 mL/hr at 05/17/15 1220  . dialysis  replacement fluid (prismasate) 400 mL/hr at 05/18/15 0149  . dialysate (PRISMASATE) 1,500 mL/hr at 05/18/15 1012   [CANCELED] Place/Maintain arterial line **AND** sodium chloride, acetaminophen **OR** acetaminophen, fentaNYL, heparin, heparin, heparin, LORazepam, ondansetron **OR** ondansetron (ZOFRAN) IV, sodium chloride, technetium TC 4M mebrofenin

## 2015-05-20 NOTE — Progress Notes (Signed)
Patient self extubated and pulled out most of his CVC. RN and NP quickly at bedside. No signs of distress. Patient placed on 2L nasal cannula, remainder of CVC removed, pressure held to site and dressing placed. No further bleeding noted from site.

## 2015-05-20 NOTE — Progress Notes (Signed)
[facility] Progress Note   Subjective: minimal UOP, still on pressors  Filed Vitals:   05/18/15 1000 05/18/15 1015 05/18/15 1030 05/18/15 1141  BP: 89/56  84/47 107/67  Pulse: 60 55 57 63  Temp: 96.6 F (35.9 C) 96.4 F (35.8 C) 96.4 F (35.8 C)   TempSrc:      Resp: 16 14 14 14   Height:      Weight:      SpO2: 100% 99% 99% 100%   Exam: On vent sedated No jvd Chest clear bilat ant and lat RRR no MRG Abd soft dec'd BS no gross ascites GU foley brown urine in small amts 1-2+ bilat pitting edema of the LE's and UE's Neuro is not responsive, sedated  UA 10/26 - 100 prot, 3-6 wbc, 7-10 rbc UNa 19 UCr 236 Urine M-spike is negative CXR 10/29 no active disease  Summary: 11 AAM w hx CKD III, HTN, dilated CM (ischemic), polysubstance abuse and homelessness admitted with recent ED visit 10.13 for n/v/, sore throat, diarrhea and rash rx'd as allergic rxn. Admitted then on 10.16 for neck/ facial swelling, cough, sore throat and hives. Eosinophilia on admission and ^LFT's of unclear etiology. Had BM bx on 10/25. BM cx's grew ECOli and blood cx's ended up growing enterobacter cloacae.    Assessment: 1 AKI due to sepsis/ shock / ATN. On pressors still, decreasing though. On CRRT since 10/27. Lytes ok. No signs of renal recovery. 2 CKD stage III, baseline creat 1.4- 1.8 3 ID on abx for GNR in blood and bone marrow cx's- on Rocephin 4 Shock - improving 5 ^LFT's supportive care 6 Hx dilated CM EF 25%  Plan - cont CRRT, keeping even   Kelly Splinter MD [facility] pager [phone] cell [phone] 05/19/2015, 2:42 PM    Last Labs      Recent Labs Lab 05/17/15 0500 05/17/15 1600 05/18/15 0415  NA 136 137 137  K 4.4 4.0 3.7  CL 105 105 104  CO2 27 28 27   GLUCOSE 145* 112* 130*  BUN 19 14 20   CREATININE 1.47* 1.05 1.31*  CALCIUM 7.0* 7.3* 7.6*  PHOS  3.3 2.7 2.9      Last Labs      Recent Labs Lab 05/15/15 0400  05/16/15 0415  05/17/15 0500 05/17/15 1600 05/18/15 0415  AST 981* --  572* --  --  --  386*  ALT 621* --  422* --  --  --  262*  ALKPHOS 211* --  170* --  --  --  186*  BILITOT 9.0* --  9.4* --  --  --  12.6*  PROT 5.7* --  5.2* --  --  --  5.6*  ALBUMIN 2.0*  2.1* < > 1.8* < > 1.8* 1.8* 1.8*  1.8*  < > = values in this interval not displayed.    Last Labs      Recent Labs Lab 05/16/15 0415 05/17/15 0500 05/18/15 0415  WBC 20.4* 20.2* 20.5*  NEUTROABS 17.0* 14.6* 13.4*  HGB 10.6* 9.4* 9.0*  HCT 30.7* 27.6* 25.7*  MCV 107.0* 103.8* 108.0*  PLT 152 146* 146*     . antiseptic oral rinse 7 mL Mouth Rinse QID  . aspirin 81 mg Oral Daily  . cefTRIAXone (ROCEPHIN) IV 2 g Intravenous Q24H  . chlorhexidine gluconate 15 mL Mouth Rinse BID  . clopidogrel 75 mg Oral Daily  . diphenhydrAMINE 25 mg Per Tube 4 times per day  . famotidine 20 mg  Oral Daily  . feeding supplement (NEPRO CARB STEADY) 1,000 mL Per Tube Q24H  . fentaNYL (SUBLIMAZE) injection 50 mcg Intravenous Once  . hydrocortisone sodium succinate 100 mg Intravenous Q12H  . insulin aspart 0-9 Units Subcutaneous 6 times per day  . mirabegron ER 25 mg Oral Daily  . sodium chloride 3 mL Intravenous Q12H   . dextrose 5 % and 0.45% NaCl 25 mL/hr at 05/18/15 1300  . fentaNYL infusion INTRAVENOUS 100 mcg/hr (05/18/15 1300)  . heparin 10,000 units/ 20 mL infusion syringe 700 Units/hr (05/18/15 1300)  . norepinephrine (LEVOPHED) Adult infusion 4.053 mcg/min (05/18/15 1300)  . dialysis replacement fluid (prismasate) 200 mL/hr at 05/17/15 1220  . dialysis replacement fluid (prismasate) 400 mL/hr at 05/18/15 0149  . dialysate (PRISMASATE) 1,500 mL/hr at  05/18/15 1012   [CANCELED] Place/Maintain arterial line **AND** sodium chloride, acetaminophen **OR** acetaminophen, fentaNYL, heparin, heparin, heparin, LORazepam, ondansetron **OR** ondansetron (ZOFRAN) IV, sodium chloride, technetium TC 62M mebrofenin

## 2015-05-20 NOTE — Progress Notes (Signed)
Pt bleeding from CVC.  Shut off heparin to CRRT.  Called nephrology to report findings.  Instructed to leave heparin off until AM shift.  Called [facility].  Received orders for Thrombi-patch.  Changed CVC dressing and applied Thrombi-patch. Will continue to monitor.

## 2015-05-20 NOTE — Progress Notes (Signed)
Date: May 20, 2015 Chart reviewed for concurrent status and case management needs. Will continue to follow patient for changes and needs: Successfully extubated this am. Velva Harman, RN, BSN, CCM   [phone]

## 2015-05-20 NOTE — Progress Notes (Addendum)
[name] for Infectious Disease    Subjective:  No complaints  Antibiotics:  Anti-infectives    Start     Dose/Rate Route Frequency Ordered Stop   05/18/15 1300  cefTRIAXone (ROCEPHIN) 2 g in dextrose 5 % 50 mL IVPB     2 g 100 mL/hr over 30 Minutes Intravenous Every 24 hours 05/18/15 1143     05/15/15 1600  vancomycin (VANCOCIN) IVPB 1000 mg/200 mL premix  Status:  Discontinued     1,000 mg 200 mL/hr over 60 Minutes Intravenous Every 48 hours 05/13/15 1507 05/13/15 2028   05/14/15 2200  vancomycin (VANCOCIN) IVPB 1000 mg/200 mL premix  Status:  Discontinued     1,000 mg 200 mL/hr over 60 Minutes Intravenous Every 24 hours 05/14/15 0633 05/14/15 0640   05/14/15 1200  piperacillin-tazobactam (ZOSYN) IVPB 2.25 g  Status:  Discontinued     2.25 g 100 mL/hr over 30 Minutes Intravenous 4 times per day 05/14/15 0910 05/14/15 1008   05/14/15 1200  piperacillin-tazobactam (ZOSYN) IVPB 3.375 g  Status:  Discontinued     3.375 g 100 mL/hr over 30 Minutes Intravenous 4 times per day 05/14/15 1008 05/14/15 1028   05/14/15 1200  imipenem-cilastatin (PRIMAXIN) 500 mg in sodium chloride 0.9 % 100 mL IVPB  Status:  Discontinued     500 mg 200 mL/hr over 30 Minutes Intravenous 4 times per day 05/14/15 1028 05/18/15 1127   05/13/15 2200  piperacillin-tazobactam (ZOSYN) IVPB 3.375 g  Status:  Discontinued     3.375 g 12.5 mL/hr over 240 Minutes Intravenous 3 times per day 05/13/15 1507 05/14/15 0910   05/13/15 2200  vancomycin (VANCOCIN) IVPB 1000 mg/200 mL premix  Status:  Discontinued     1,000 mg 200 mL/hr over 60 Minutes Intravenous Every 48 hours 05/13/15 2028 05/14/15 0631   05/13/15 1430  piperacillin-tazobactam (ZOSYN) IVPB 3.375 g  Status:  Discontinued     3.375 g 100 mL/hr over 30 Minutes Intravenous  Once 05/13/15 1428 05/13/15 2028   05/13/15 1430  vancomycin (VANCOCIN) IVPB 1000 mg/200 mL premix  Status:  Discontinued     1,000 mg 200 mL/hr over 60  Minutes Intravenous  Once 05/13/15 1428 05/13/15 2028   05/07/15 0700  vancomycin (VANCOCIN) IVPB 1000 mg/200 mL premix  Status:  Discontinued     1,000 mg 200 mL/hr over 60 Minutes Intravenous Every 24 hours 05/07/15 0611 05/08/15 1438   05/04/15 1100  piperacillin-tazobactam (ZOSYN) IVPB 3.375 g  Status:  Discontinued     3.375 g 12.5 mL/hr over 240 Minutes Intravenous 3 times per day 05/04/15 1003 05/08/15 1438   05/04/15 0600  vancomycin (VANCOCIN) IVPB 750 mg/150 ml premix  Status:  Discontinued     750 mg 150 mL/hr over 60 Minutes Intravenous Every 24 hours 05/04/15 0557 05/07/15 0610   05/03/15 2200  clindamycin (CLEOCIN) IVPB 600 mg  Status:  Discontinued     600 mg 100 mL/hr over 30 Minutes Intravenous 3 times per day 05/03/15 1524 05/04/15 0225   05/03/15 1330  clindamycin (CLEOCIN) IVPB 600 mg     600 mg 100 mL/hr over 30 Minutes Intravenous  Once 05/03/15 1326 05/03/15 1457      Medications: Scheduled Meds: . antiseptic oral rinse  7 mL Mouth Rinse QID  . aspirin  81 mg Oral Daily  . cefTRIAXone (ROCEPHIN)  IV  2 g Intravenous Q24H  . chlorhexidine gluconate  15 mL  Mouth Rinse BID  . clopidogrel  75 mg Oral Daily  . diphenhydrAMINE  25 mg Per Tube 3 times per day  . famotidine  20 mg Oral Daily  . hydrocortisone sodium succinate  25 mg Intravenous Q12H  . insulin aspart  0-9 Units Subcutaneous 6 times per day  . sodium chloride  3 mL Intravenous Q12H   Continuous Infusions: . dextrose 25 mL/hr at 05/20/15 1604  . heparin 10,000 units/ 20 mL infusion syringe Stopped (05/20/15 0700)  . norepinephrine (LEVOPHED) Adult infusion Stopped (05/20/15 0830)  . dialysis replacement fluid (prismasate) 200 mL/hr at 05/19/15 1732  . dialysis replacement fluid (prismasate) 400 mL/hr at 05/20/15 0645  . dialysate (PRISMASATE) 1,500 mL/hr at 05/20/15 1345   PRN Meds:.[CANCELED] Place/Maintain arterial line **AND** sodium chloride, acetaminophen **OR** acetaminophen, heparin,  heparin, heparin, ondansetron **OR** ondansetron (ZOFRAN) IV, promethazine, sodium chloride, technetium TC 73M mebrofenin    Objective: Weight change: 1 lb 1.6 oz (0.5 kg)  Intake/Output Summary (Last 24 hours) at 05/20/15 1727 Last data filed at 05/20/15 1700  Gross per 24 hour  Intake 1771.16 ml  Output   2036 ml  Net -264.84 ml   Blood pressure 127/90, pulse 79, temperature 96.3 F (35.7 C), temperature source Core (Comment), resp. rate 12, height _0  (1.727 m), weight 172 lb 2.9 oz (78.1 kg), SpO2 100 %. Temp:  [95.7 F (35.4 C)-97.9 F (36.6 C)] 96.3 F (35.7 C) (11/02 1700) Pulse Rate:  [43-89] 79 (11/02 1700) Resp:  [9-23] 12 (11/02 1700) BP: (84-136)/(49-98) 127/90 mmHg (11/02 1700) SpO2:  [98 %-100 %] 100 % (11/02 1700) FiO2 (%):  [30 %] 30 % (11/02 0900) Weight:  [172 lb 2.9 oz (78.1 kg)] 172 lb 2.9 oz (78.1 kg) (11/02 0500)  Physical Exam: General: extubated alert oriented to person HEENT: icteric sclera,  EOMI CVS tachycardic, regular rate, normal r,  no murmur rubs or gallops Chest:  rhonchi Abdomen: soft  nondistended, normal bowel sounds, Extremities: 2+ edema Skin: rash he had previously has improved with bathing int he ICU it seems Neuro: nonfocal  CBC: CBC Latest Ref Rng 05/20/2015 05/19/2015 05/18/2015  WBC 4.0 - 10.5 K/uL 15.5(H) 16.1(H) 20.5(H)  Hemoglobin 13.0 - 17.0 g/dL 8.3(L) 8.7(L) 9.0(L)  Hematocrit 39.0 - 52.0 % 25.0(L) 26.3(L) 25.7(L)  Platelets 150 - 400 K/uL 128(L) 127(L) 146(L)       BMET  Recent Labs  05/20/15 0515 05/20/15 1610  NA 138 137  K 3.8 3.7  CL 107 102  CO2 28 28  GLUCOSE 134* 111*  BUN 19 17  CREATININE 1.25* 1.13  CALCIUM 8.1* 8.4*     Liver Panel   Recent Labs  05/18/15 0415  05/20/15 0515 05/20/15 1610  PROT 5.6*  --  5.7*  --   ALBUMIN 1.8*  1.8*  < > 1.8*  1.9* 2.1*  AST 386*  --  222*  --   ALT 262*  --  178*  --   ALKPHOS 186*  --  236*  --   BILITOT 12.6*  --  10.4*  --   BILIDIR 7.7*   --  6.5*  --   IBILI 4.9*  --  3.9*  --   < > = values in this interval not displayed.     Sedimentation Rate No results for input(s): ESRSEDRATE in the last 72 hours. C-Reactive Protein No results for input(s): CRP in the last 72 hours.  Micro Results: Recent Results (from the past 720 hour(s))  Culture,  blood (routine x 2)     Status: None   Collection Time: 05/04/15  2:50 AM  Result Value Ref Range Status   Specimen Description BLOOD RIGHT ARM  Final   Special Requests BOTTLES DRAWN AEROBIC AND ANAEROBIC [place]  Final   Culture   Final    NO GROWTH 5 DAYS Performed at [facility]    Report Status 05/09/2015 FINAL  Final  Culture, blood (routine x 2)     Status: None   Collection Time: 05/04/15  2:55 AM  Result Value Ref Range Status   Specimen Description BLOOD RIGHT HAND  Final   Special Requests BOTTLES DRAWN AEROBIC ONLY 10CC  Final   Culture   Final    NO GROWTH 5 DAYS Performed at [facility]    Report Status 05/09/2015 FINAL  Final  Culture, Urine     Status: None   Collection Time: 05/04/15 10:57 PM  Result Value Ref Range Status   Specimen Description URINE, RANDOM  Final   Special Requests NONE  Final   Culture   Final    NO GROWTH 1 DAY Performed at [facility]    Report Status 05/06/2015 FINAL  Final  Culture, Urine     Status: None   Collection Time: 05/11/15  4:21 PM  Result Value Ref Range Status   Specimen Description URINE, CLEAN CATCH  Final   Special Requests Normal  Final   Culture   Final    NO GROWTH 2 DAYS Performed at [facility]    Report Status 05/13/2015 FINAL  Final  Tissue culture     Status: None   Collection Time: 05/12/15  9:50 AM  Result Value Ref Range Status   Specimen Description BIOPSY BONE MARROW  Final   Special Requests NONE  Final   Gram Stain   Final    MODERATE WBC PRESENT,BOTH PMN AND MONONUCLEAR NO SQUAMOUS EPITHELIAL CELLS SEEN NO ORGANISMS SEEN Performed at [facility]    Culture   Final    FEW ESCHERICHIA COLI Performed at [facility]    Report Status 05/14/2015 FINAL  Final   Organism ID, Bacteria ESCHERICHIA COLI  Final      Susceptibility   Escherichia coli - MIC*    AMPICILLIN >=32 RESISTANT Resistant     AMPICILLIN/SULBACTAM >=32 RESISTANT Resistant     CEFAZOLIN >=64 RESISTANT Resistant     CEFEPIME <=1 SENSITIVE Sensitive     CEFTAZIDIME <=1 SENSITIVE Sensitive     CEFTRIAXONE <=1 SENSITIVE Sensitive     CIPROFLOXACIN <=0.25 SENSITIVE Sensitive     GENTAMICIN <=1 SENSITIVE Sensitive     IMIPENEM <=0.25 SENSITIVE Sensitive     PIP/TAZO 8 SENSITIVE Sensitive     TOBRAMYCIN <=1 SENSITIVE Sensitive     TRIMETH/SULFA Value in next row Sensitive      <=20 SENSITIVE(NOTE)    * FEW ESCHERICHIA COLI  Fungus culture w smear     Status: None (Preliminary result)   Collection Time: 05/12/15  9:50 AM  Result Value Ref Range Status   Specimen Description BIOPSY BONE MARROW  Final   Special Requests NONE  Final   Fungal Smear   Final    NO YEAST OR FUNGAL ELEMENTS SEEN Performed at [facility]    Culture   Final    CULTURE IN PROGRESS FOR FOUR WEEKS Performed at [facility]    Report Status PENDING  Incomplete  AFB culture with  smear     Status: None (Preliminary result)   Collection Time: 05/12/15  9:50 AM  Result Value Ref Range Status   Specimen Description BIOPSY BONE MARROW  Final   Special Requests NONE  Final   Acid Fast Smear   Final    NO ACID FAST BACILLI SEEN Performed at [facility]    Culture   Final    CULTURE WILL BE EXAMINED FOR 6 WEEKS BEFORE ISSUING A FINAL REPORT Performed at [facility]    Report Status PENDING  Incomplete  MRSA PCR Screening     Status: None   Collection Time: 05/13/15  2:59 PM  Result Value Ref Range Status   MRSA by PCR NEGATIVE NEGATIVE Final    Comment:        The GeneXpert MRSA Assay (FDA approved for NASAL specimens only), is one  component of a comprehensive MRSA colonization surveillance program. It is not intended to diagnose MRSA infection nor to guide or monitor treatment for MRSA infections.   Culture, blood (x 2)     Status: None   Collection Time: 05/13/15  4:05 PM  Result Value Ref Range Status   Specimen Description BLOOD RIGHT ARM  Final   Special Requests BOTTLES DRAWN AEROBIC AND ANAEROBIC  5CC  Final   Culture  Setup Time   Final    GRAM NEGATIVE RODS AEROBIC BOTTLE ONLY CRITICAL RESULT CALLED TO, READ BACK BY AND VERIFIED WITH: L FREI,RN AT 5397 05/14/15 BY L BENFIELD    Culture   Final    ENTEROBACTER CLOACAE Performed at [facility]    Report Status 05/16/2015 FINAL  Final   Organism ID, Bacteria ENTEROBACTER CLOACAE  Final      Susceptibility   Enterobacter cloacae - MIC*    CEFAZOLIN >=64 RESISTANT Resistant     CEFEPIME <=1 SENSITIVE Sensitive     CEFTAZIDIME <=1 SENSITIVE Sensitive     CEFTRIAXONE <=1 SENSITIVE Sensitive     CIPROFLOXACIN <=0.25 SENSITIVE Sensitive     GENTAMICIN <=1 SENSITIVE Sensitive     IMIPENEM 1 SENSITIVE Sensitive     TRIMETH/SULFA <=20 SENSITIVE Sensitive     PIP/TAZO <=4 SENSITIVE Sensitive     * ENTEROBACTER CLOACAE  Culture, blood (x 2)     Status: None   Collection Time: 05/13/15  4:24 PM  Result Value Ref Range Status   Specimen Description BLOOD RIGHT ARM  Final   Special Requests BOTTLES DRAWN AEROBIC AND ANAEROBIC  5CC  Final   Culture  Setup Time   Final    GRAM NEGATIVE RODS IN BOTH AEROBIC AND ANAEROBIC BOTTLES CRITICAL RESULT CALLED TO, READ BACK BY AND VERIFIED WITH: S TROTS _0  05/14/15 MKELLY    Culture   Final    ENTEROBACTER CLOACAE SUSCEPTIBILITIES PERFORMED ON PREVIOUS CULTURE WITHIN THE LAST 5 DAYS. Performed at [facility]    Report Status 05/16/2015 FINAL  Final  Ova and parasite examination     Status: None   Collection Time: 05/14/15  4:25 PM  Result Value Ref Range Status   Specimen Description STOOL   Final   Special Requests NONE  Final   Ova and parasites   Final    NO OVA OR PARASITES SEEN Performed at [facility]    Report Status 05/15/2015 FINAL  Final  Stool culture     Status: None   Collection Time: 05/14/15  4:25 PM  Result Value Ref Range Status   Specimen  Description STOOL  Final   Special Requests NONE  Final   Culture   Final    NO SALMONELLA, SHIGELLA, CAMPYLOBACTER, YERSINIA, OR E.COLI 0157:H7 ISOLATED Performed at [facility]    Report Status 05/18/2015 FINAL  Final  Culture, respiratory (NON-Expectorated)     Status: None (Preliminary result)   Collection Time: 05/18/15 12:30 PM  Result Value Ref Range Status   Specimen Description BRONCHIAL ALVEOLAR LAVAGE  Final   Special Requests Normal  Final   Gram Stain   Final    ABUNDANT WBC PRESENT, PREDOMINANTLY PMN NO SQUAMOUS EPITHELIAL CELLS SEEN NO ORGANISMS SEEN Performed at [facility]    Culture   Final    RARE GRAM NEGATIVE RODS Performed at [facility]    Report Status PENDING  Incomplete  Fungus Culture with Smear     Status: None (Preliminary result)   Collection Time: 05/18/15 12:30 PM  Result Value Ref Range Status   Specimen Description BRONCHIAL ALVEOLAR LAVAGE  Final   Special Requests Normal  Final   Fungal Smear   Final    NO YEAST OR FUNGAL ELEMENTS SEEN Performed at [facility]    Culture   Final    CULTURE IN PROGRESS FOR FOUR WEEKS Performed at [facility]    Report Status PENDING  Incomplete  AFB culture with smear     Status: None (Preliminary result)   Collection Time: 05/18/15 12:30 PM  Result Value Ref Range Status   Specimen Description BRONCHIAL ALVEOLAR LAVAGE  Final   Special Requests Normal  Final   Acid Fast Smear   Final    NO ACID FAST BACILLI SEEN Performed at [facility]    Culture   Final    CULTURE WILL BE EXAMINED FOR 6 WEEKS BEFORE ISSUING A FINAL REPORT Performed at [facility]     Report Status PENDING  Incomplete    Studies/Results: No results found.    Assessment/Plan:  INTERVAL HISTORY:   05/18/15: pt sp bronchoscopy but BAL for O&P lost 05/20/15: pt self extubated and now off pressors   Principal Problem:   Sepsis due to cellulitis [name]) Active Problems:   Homelessness   Neck swelling   Acute renal failure superimposed on stage 3 chronic kidney disease (HCC)   Chronic combined systolic (congestive) and diastolic (congestive) heart failure (HCC)   Benign essential HTN   History of non-ST elevation myocardial infarction (NSTEMI)   Hyponatremia   Abnormal LFTs   Polysubstance abuse   Noncompliance   Leukocytosis   Cellulitis of neck   Dysphagia   Elevated LFTs   Submandibular gland infection   Urticaria   Rash and nonspecific skin eruption   Dry mouth   Dyspnea   FUO (fever of unknown origin)   Acute respiratory failure (HCC)   Renal failure (ARF), acute on chronic ([place])   Sepsis ([place])   Endotracheal tube present   Transaminitis   Septic shock ([place])   Multiorgan failure   Gram-negative bacteremia ([place])   Infection caused by Enterobacter cloacae   E coli infection   Eosinophilia   Hyperbilirubinemia    [name] is a 59 y.o. male with  Enterobacter bacteremia and septic shock with multi-organ failure, has been on pressors, CVVDH, with  previous E coli on bone marrow biopsy, eosinophilia of unkown cause, hyperbirubinemia  #1 Septic shock due Gram negative Enterobacter sepsis: Improving off pressors, extubated still on CVVVHD  --continue rocephin 2 grams daily  #  2 Bronchial secretions on BAL: followup cultures so far rare GNR  #3 E coli on bone marrow biopsy: not clear how this came about  #4 Eosinophilia: still a mystery, O&P negative, strongyloides antibody negative in serum and now eosinophilia resolved but on steroids  #5 Hyperbilirubinemia: is this due to an acute liver injury?  #6 Renal failure: on CVVHD    LOS: 16  days   Alcide Evener 05/20/2015, 5:27 PM

## 2015-05-20 NOTE — Progress Notes (Signed)
[place] Progress Note Patient Name: Riko Muffler DOB: 05-Jun-1956 MRN: [id number]   Date of Service  05/20/2015  HPI/Events of Note  Patient c/o pain.   eICU Interventions  Will order: 1. Fentanyl 25-100 mcg IV Q 2 hours PRN pain.     Intervention Category Intermediate Interventions: Pain - evaluation and management  Geffrey Michaelsen [place] 05/20/2015, 7:42 PM

## 2015-05-20 NOTE — Progress Notes (Signed)
Pt self extubated.  Also pulled out left IJ  Looks good. No distress  Plan [place] NPO Dc all sedation Keep ICU status  Erick Colace ACNP-BC [place] Pager # [phone] OR # [phone] if no answer

## 2015-05-20 NOTE — Progress Notes (Signed)
Nutrition Follow-up  INTERVENTION:   Diet advancement per MD If unable to advance diet, re-initiate nutrition support. RD to continue to monitor  NUTRITION DIAGNOSIS:   Inadequate oral intake related to inability to eat as evidenced by NPO status.  Ongoing.  GOAL:   Patient will meet greater than or equal to 90% of their needs  Not meeting, NPO.  MONITOR:   Diet advancement, Labs, Weight trends, Skin, I & O's  ASSESSMENT:   59 year old male with past medical history of hypertension, chronic combined CHF, CAD, NSTEMI (on aspirin and plavix), CKD stage 3 who presented to [facility] ED with reports of neck swelling which has been there for some time but has gotten worse in past few days.   Pt self-extubated this morning. Pt is NPO, will await diet advancement. If unable to advance diet, recommend starting nutrition support. Patient was receiving Nepro at goal rate of 55 ml/hr.  Labs reviewed: CBGs: 119-127 Elevated Creatinine Mg/Phos WNL  Diet Order:  Diet NPO time specified  Skin:      Incisions/wounds to: low back, R arm, coccyx; R buttocks laceration.  Last BM:  10/29  Height:   Ht Readings from Last 1 Encounters:  05/18/15 5\' 8"  (1.727 m)    Weight:   Wt Readings from Last 1 Encounters:  05/20/15 172 lb 2.9 oz (78.1 kg)    Ideal Body Weight:  70 kg  BMI:  Body mass index is 26.19 kg/(m^2).  Estimated Nutritional Needs:   Kcal:  2340-2730  Protein:  95-105g  Fluid:  1.2-1.5 L/day  EDUCATION NEEDS:   No education needs identified at this time  Clayton Bibles, MS, RD, LDN Pager: [phone] After Hours Pager: [phone]

## 2015-05-20 NOTE — Progress Notes (Signed)
[place] Progress Note Patient Name: [name] DOB: Sep 24, 1955 MRN: [id number]   Date of Service  05/20/2015  HPI/Events of Note  Patient c/o nausea. No relief from Zofran IV.   eICU Interventions  Will add Phenergan 12.5 mg IV Q 5 hours PRN nausea or vomiting.     Intervention Category Minor Interventions: Routine modifications to care plan (e.g. PRN medications for pain, fever)  Sommer,Steven [place] 05/20/2015, 3:58 PM

## 2015-05-20 NOTE — Procedures (Signed)
Extubation Procedure Note  Patient Details:   Name: [name] DOB: Sep 25, 1955 MRN: [id number]   Airway Documentation:     Evaluation  Pt self extubated. No dyspnea or stridor noted. Pt placed on 2L [place]. RT will continue to monitor.      Mariam Dollar 05/20/2015, 10:29 AM

## 2015-05-20 NOTE — Progress Notes (Signed)
Name: [name] MRN: [id number] DOB: [birth year]    ADMISSION DATE:  05/03/2015 CONSULTATION DATE:  05/13/15  REFERRING MD :  Dr. Doyle Askew   CHIEF COMPLAINT:  Fever of unknown origin, tachypnea/tachycardia  BRIEF PATIENT DESCRIPTION:  59 y/o M, homeless male (lives in a hotel) with PMH of HTN, NSTEMI, HLD, arthritis, anxiety / depression, former polysubstance abuse with cardiomyopathy (EF20%), CKD IV and medical non-compliance who presented to [facility] on 10/16 with complaints of neck/face swelling with hives.The patient has also had intermittent fever and rash.  Interestingly, WBC reduced when antibiotics were stopped.  Eosinophils rose to 4.1K on 10/25.  On 10/26, he developed tachycardia, tachypnea and fever to 101. on 10/26 he underwent a bone marrow biopsy.  He developed fever, tachypnea and tachycardia on 10/26 and was transferred to ICU.    SIGNIFICANT EVENTS  10/13  Seen in ER for N/V, sore throat, diarrhea & rash 10/16  Admit to [facility] for facial swelling, cough with frothy yellow sputum, sore throat and hives. 10/26 bone marrow bx done as part of evaluation for eosinophilia. Developed worsening distress later that afternoon. intubated 10/27 cvvh started 10/28-10/31: Bone marrow bx from 26th= ecoli, BCx2 showing Enterobacter. ID following. By 10/30 pressors weaning. Still on CRRT. 10/31 attempting weaning efforts.  10/31: BAL to eval to help explain: unexplained syndrome characterized by fever, hypereosinophilia with high IgE. Primaxin changed to Rocephin by ID 11/1: sedated. No spont effort during weaning. Fentanyl gtt stopped.   STUDIES:  10/13  RUQ [place] - mild gallbladder wall thickening, no gallstones observed 10/16  CXR - no acute infiltrate 10/16  CT Soft tissue Neck - asymmetric enlargement of R submandibular gland relative to the left is non-specific but could be due to infection, inflammation or tumor.  Negative for abscess, diffuse subcutaneous edema 10/17  MRI Soft Tissue  Neck - motion degraded study, no discrete mass, grossly similar subcutaneous edema involving the bilateral submandibular spaces (nonspecific but must consider infection/cellulitis), no discrete abscess  10/21  CXR - no acute infiltrate 10/21  CT Abd/Pelvis - diffuse body wall edema suggestive of anasarca, mesenteric edema but no overt ascites, no acute intra-abdominal abnormalities, stable prostate gland enlargement and small hiatal hernia 10/26 TTE - LV & RV moderately dilated. EF 25-30% w/ diffuse hypokinesis of LV. Moderate MR. No pericardial effusion. 10/26 Renal US - Medical renal disease. Right pleural effusion. No hydronephrosis. 10/27 CT chest, abd, pelvis - Bibasilar atelectasis/ early infiltrate, anasarca. 10/29 RUQ [place] - no stones. No perichole fluid. CBD 79m. No focal liver lesion. Right pleural effusion.  SUBJECTIVE: Sedated on vent   VITAL SIGNS: Temp:  [95.9 F (35.5 C)-97.9 F (36.6 C)] 96.6 F (35.9 C) (11/02 0800) Pulse Rate:  [43-142] 64 (11/02 0800) Resp:  [13-31] 15 (11/02 0800) BP: (84-136)/(49-88) 112/74 mmHg (11/02 0800) SpO2:  [98 %-100 %] 100 % (11/02 0800) FiO2 (%):  [30 %] 30 % (11/02 0800) Weight:  [78.1 kg (172 lb 2.9 oz)] 78.1 kg (172 lb 2.9 oz) (11/02 0500)  Intake/Output Summary (Last 24 hours) at 05/20/15 0904 Last data filed at 05/20/15 0800  Gross per 24 hour  Intake 1992.29 ml  Output   1959 ml  Net  33.29 ml    PHYSICAL EXAMINATION: General:  More awake;  Currently on CVVHD. No distress. Warming blanket in place  Integument:  Warm & dry. Flaking epidermal rash on extremities with exposed skin. HEENT:  Endotracheal tube in place. Bilateral scleral edema. Cardiovascular:  Regular rhythm. Anasarca improving.  Currently normal sinus rhythm on telemetry. Pulmonary: clear breath sounds bilaterally. Symmetric chest wall rise on ventilator. No accessory muscle use  Abdomen: Soft. Hypoactive bowel sounds. Protuberant. Musculoskeletal:  No joint  deformity or effusion appreciated. Normal muscle bulk. Neurological: Pupils reactive. Currently less sedated. On fentanyl drip.   Recent Labs Lab 05/19/15 0449 05/19/15 1625 05/20/15 0515  NA 138 137 138  K 3.7 3.6 3.8  CL 105 105 107  CO2 '30 27 28  ' BUN '19 17 19  ' CREATININE 1.26* 1.20 1.25*  GLUCOSE 108* 123* 134*    Recent Labs Lab 05/18/15 0415 05/19/15 0449 05/20/15 0515  HGB 9.0* 8.7* 8.3*  HCT 25.7* 26.3* 25.0*  WBC 20.5* 16.1* 15.5*  PLT 146* 127* 128*   CBG (last 3)   Recent Labs  05/19/15 2338 05/20/15 0350 05/20/15 0753  GLUCAP 122* 119* 127*      Recent Labs Lab 05/16/15 0415 05/18/15 0415 05/20/15 0515  ALT 422* 262* 178*  AST 572* 386* 222*  ALKPHOS 170* 186* 236*  BILITOT 9.4* 12.6* 10.4*     ASSESSMENT / PLAN:  PULMONARY A:  Acute Hypoxic Respiratory Failure - Improving. Multifactorial in setting of septic shock & cardiomyopathy. Completely apneic during PSV trial.  P:   Vent bundle SBT today-->hope extubate soon D/c fent gtt   CARDIOVASCULAR R IJ HD catheter 10/26 >>  Lt I J CVL 10/27>> A:   Septic Shock - Improving. ? Some degree of pressor dependence r/t sedation  Moderate mitral regurgitation Dilated cardiomyopathy  H/O CAD H/O HTN  P:  Continuing Levo gtt to maintain MAP>65-->weaning to off Monitoring on Telemetry Aspirin 81 mg by mouth daily Plavix 75 mg by mouth daily Holding on ACE inhibitor given acute renal failure & hypotension  RENAL A:   Acute renal failure - On CVVHD. Lactic Acidosis - Improving. Hyperkalemia - Resolved.  >not making a lot of urine P:   Awaiting Urine Eos. Continuing CVVHD. Electrolyte monitoring per Nephrology   GASTROINTESTINAL A:   Shock Liver - Improving transaminitis.  Questionable EtOH Liver Disease  P:   Cont tubefeeds Pepcid IV daily   HEMATOLOGIC A:   Peripheral eosinophilia - Resolved. Bone marrow biopsy not c/w with hypereosinophilic syndrome. IgE  5125. Possible Churg-Strauss - Although no pulmonary infiltrates and ANCA is negative. Possible Zosyn Allergy Thrombocytopenia - Possible consumption with 2 clotted filters-->stable Leukocytosis - Secondary to steroids & sepsis  P:  Repeat IgE 10/29  pending  Monitoring platelet count daily with CBC Trending leukocytosis daily with CBC Continuing Solu-Cortef IV q12hr-->decrease dose  SCDs Holding heparin subcutaneous given aPTT >200 on heparin for CVVHD F/u fungal culture   INFECTIOUS A:   Septic Shock - Shock improving. Enterobacter Bacteremia: TTE 10/26 without evidence of endocarditis E coli Osteomyelitis - from bone marrow biopsy-->not sure where this came from.  HIV negative Autoimmune negative to date. Stool neg    Vanco 10/26 - 10/27 Zosyn 10/26 - 10/26 Clinda x 1 10/17  Vanco 10/17 - 10/21  Zosyn 10/17 - 10/21 P:   Repeat Blood Cultures(10/29)>>> 10/31 BAL: rare GNR>>>  Appreciate ID assistance Primaxin 10/26 >>10/31 Rocephin 10/31>>>   DERMATOLOGY:  A:  Rash w/ Dermal Peeling - Unclear etiology.   P: Consider skin biopsy for progression. Solu-Cortef decrease to 13m q12-->decrease to 25 mg q12  ENDOCRINE A: Hypoglycemia - Improving w/ some Novolog insulin requirement.  P:   Continuing dextrose drip D5 NS; decrease to 25 ml/hr and Hydrocortisone Continuing Accuchecks q4hr  NEUROLOGIC  A:  No focal issues on exam 10/29. Sedated for vent tolerance-->now more awake, follows commands  P:   RASS goal: -1 Fentanyl gtt-->try to wean to off; aim for PRN ONLY  Versed IV prn-->dc  FAMILY  - Updates: No family at bedside today.  - Inter-disciplinary family meet or Palliative Care meeting due by:  05/18/15   TODAY'S SUMMARY:  59 year old complex male with septic shock secondary to Escherichia coli osteomyelitis as well as enterobacter bacteremia.Repeat BCs and sputum pending. Patient continuing on Primaxin. Eosinophilia has resolved. Continuing patient  on Solu-Cortef every 12 hours. Awaiting repeat serum IgE level. Awaiting BAL and fungal cultures from bronch to possibly help explain eosinophilia. Hemodynamically stable. Sedation biggest barrier to [facility] improved today and doing well w/ PSV.Marland KitchenPossibly extubate later this am   Erick Colace ACNP-BC [place] Pager # [phone] OR # [phone] if no answer  05/20/2015, 8:55 AM

## 2015-05-20 NOTE — Progress Notes (Signed)
Expand All Collapse All    [facility] Progress Note   Subjective: not responding. I/O's about even  Filed Vitals:   05/18/15 1000 05/18/15 1015 05/18/15 1030 05/18/15 1141  BP: 89/56  84/47 107/67  Pulse: 60 55 57 63  Temp: 96.6 F (35.9 C) 96.4 F (35.8 C) 96.4 F (35.8 C)   TempSrc:      Resp: 16 14 14 14   Height:      Weight:      SpO2: 100% 99% 99% 100%   Exam: On vent sedated, unresponsive No jvd Chest clear bilat ant and lat RRR no MRG Abd soft dec'd BS no gross ascites GU foley brown urine in small amts 1-2+ bilat pitting edema of the LE's and UE's Neuro is not responsive, sedated  UA 10/26 - 100 prot, 3-6 wbc, 7-10 rbc UNa 19 UCr 236 Urine M-spike is negative CXR 10/29 no active disease  Summary: 82 AAM w hx CKD III, HTN, dilated CM (ischemic), polysubstance abuse and homelessness admitted with recent ED visit 10.13 for n/v/, sore throat, diarrhea and rash rx'd as allergic rxn. Admitted then on 10.16 for neck/ facial swelling, cough, sore throat and hives. Eosinophilia on admission and ^LFT's of unclear etiology. Had BM bx on 10/25. BM cx's grew ECOli and blood cx's ended up growing enterobacter cloacae.    Assessment: 1 AKI due to sepsis/ shock / ATN. Remains on pressors. Doubt AIN. On CRRT since 10/27, heparin added. Lytes ok. Minimal signs of recovery. 2 CKD stage III, baseline creat 1.4- 1.8 3 ID on abx for GNR in blood and bone marrow cx's, on IV Rocephin now 4 Septic shock - better 5 ^LFT's supportive care 6 Hx dilated CM EF 25%  Plan - cont CRRT   Kelly Splinter MD [facility] pager [phone] cell [phone] 05/18/2015, 1:25 PM    Last Labs      Recent Labs Lab 05/17/15 0500 05/17/15 1600 05/18/15 0415  NA 136 137 137  K 4.4 4.0 3.7  CL 105 105 104  CO2 27 28 27   GLUCOSE 145* 112* 130*  BUN 19 14 20     CREATININE 1.47* 1.05 1.31*  CALCIUM 7.0* 7.3* 7.6*  PHOS 3.3 2.7 2.9      Last Labs      Recent Labs Lab 05/15/15 0400  05/16/15 0415  05/17/15 0500 05/17/15 1600 05/18/15 0415  AST 981* --  572* --  --  --  386*  ALT 621* --  422* --  --  --  262*  ALKPHOS 211* --  170* --  --  --  186*  BILITOT 9.0* --  9.4* --  --  --  12.6*  PROT 5.7* --  5.2* --  --  --  5.6*  ALBUMIN 2.0*  2.1* < > 1.8* < > 1.8* 1.8* 1.8*  1.8*  < > = values in this interval not displayed.    Last Labs      Recent Labs Lab 05/16/15 0415 05/17/15 0500 05/18/15 0415  WBC 20.4* 20.2* 20.5*  NEUTROABS 17.0* 14.6* 13.4*  HGB 10.6* 9.4* 9.0*  HCT 30.7* 27.6* 25.7*  MCV 107.0* 103.8* 108.0*  PLT 152 146* 146*     . antiseptic oral rinse 7 mL Mouth Rinse QID  . aspirin 81 mg Oral Daily  . cefTRIAXone (ROCEPHIN) IV 2 g Intravenous Q24H  . chlorhexidine gluconate 15 mL Mouth Rinse BID  . clopidogrel 75 mg Oral Daily  . diphenhydrAMINE  25 mg Per Tube 4 times per day  . famotidine 20 mg Oral Daily  . feeding supplement (NEPRO CARB STEADY) 1,000 mL Per Tube Q24H  . fentaNYL (SUBLIMAZE) injection 50 mcg Intravenous Once  . hydrocortisone sodium succinate 100 mg Intravenous Q12H  . insulin aspart 0-9 Units Subcutaneous 6 times per day  . mirabegron ER 25 mg Oral Daily  . sodium chloride 3 mL Intravenous Q12H   . dextrose 5 % and 0.45% NaCl 25 mL/hr at 05/18/15 1300  . fentaNYL infusion INTRAVENOUS 100 mcg/hr (05/18/15 1300)  . heparin 10,000 units/ 20 mL infusion syringe 700 Units/hr (05/18/15 1300)  . norepinephrine (LEVOPHED) Adult infusion 4.053 mcg/min (05/18/15 1300)  . dialysis replacement fluid (prismasate) 200 mL/hr at 05/17/15 1220  . dialysis replacement fluid (prismasate) 400  mL/hr at 05/18/15 0149  . dialysate (PRISMASATE) 1,500 mL/hr at 05/18/15 1012   [CANCELED] Place/Maintain arterial line **AND** sodium chloride, acetaminophen **OR** acetaminophen, fentaNYL, heparin, heparin, heparin, LORazepam, ondansetron **OR** ondansetron (ZOFRAN) IV, sodium chloride, technetium TC 77M mebrofenin        Revision History     Date/Time User Provider Type Action   05/18/2015 2:54 PM Roney Jaffe, MD Physician Addend   05/18/2015 1:34 PM Roney Jaffe, MD Physician Sign   View Details Report

## 2015-05-21 DIAGNOSIS — R7881 Bacteremia: Secondary | ICD-10-CM

## 2015-05-21 DIAGNOSIS — I5042 Chronic combined systolic (congestive) and diastolic (congestive) heart failure: Secondary | ICD-10-CM

## 2015-05-21 DIAGNOSIS — A498 Other bacterial infections of unspecified site: Secondary | ICD-10-CM

## 2015-05-21 DIAGNOSIS — L03221 Cellulitis of neck: Secondary | ICD-10-CM

## 2015-05-21 LAB — RENAL FUNCTION PANEL
ALBUMIN: 2.3 g/dL — AB (ref 3.5–5.0)
ALBUMIN: 2.3 g/dL — AB (ref 3.5–5.0)
ANION GAP: 6 (ref 5–15)
Anion gap: 5 (ref 5–15)
BUN: 17 mg/dL (ref 6–20)
BUN: 17 mg/dL (ref 6–20)
CALCIUM: 8.8 mg/dL — AB (ref 8.9–10.3)
CHLORIDE: 103 mmol/L (ref 101–111)
CO2: 28 mmol/L (ref 22–32)
CO2: 28 mmol/L (ref 22–32)
CREATININE: 1.27 mg/dL — AB (ref 0.61–1.24)
Calcium: 8.6 mg/dL — ABNORMAL LOW (ref 8.9–10.3)
Chloride: 105 mmol/L (ref 101–111)
Creatinine, Ser: 1.22 mg/dL (ref 0.61–1.24)
GFR calc Af Amer: >60 mL/min (ref >60)
GFR calc Af Amer: >60 mL/min (ref >60)
GFR calc non Af Amer: 59 mL/min — ABNORMAL LOW (ref >60)
GLUCOSE: 96 mg/dL (ref 65–99)
GLUCOSE: 112 mg/dL — AB (ref 65–99)
PHOSPHORUS: 3.1 mg/dL (ref 2.5–4.6)
PHOSPHORUS: 3 mg/dL (ref 2.5–4.6)
POTASSIUM: 4.3 mmol/L (ref 3.5–5.1)
Potassium: 4.1 mmol/L (ref 3.5–5.1)
SODIUM: 138 mmol/L (ref 135–145)
Sodium: 137 mmol/L (ref 135–145)

## 2015-05-21 LAB — GLUCOSE, CAPILLARY
GLUCOSE-CAPILLARY: 92 mg/dL (ref 65–99)
GLUCOSE-CAPILLARY: 93 mg/dL (ref 65–99)
GLUCOSE-CAPILLARY: 104 mg/dL — AB (ref 65–99)
Glucose-Capillary: 94 mg/dL (ref 65–99)
Glucose-Capillary: 96 mg/dL (ref 65–99)
Glucose-Capillary: 85 mg/dL (ref 65–99)

## 2015-05-21 LAB — CULTURE, RESPIRATORY: SPECIAL REQUESTS: NORMAL

## 2015-05-21 LAB — CULTURE, RESPIRATORY W GRAM STAIN

## 2015-05-21 LAB — OVA AND PARASITE EXAMINATION
Ova and parasites: NONE SEEN
SPECIAL REQUESTS: NORMAL

## 2015-05-21 LAB — GAMMA GT: GGT: 277 U/L — ABNORMAL HIGH (ref 7–50)

## 2015-05-21 LAB — MAGNESIUM: Magnesium: 2.3 mg/dL (ref 1.7–2.4)

## 2015-05-21 LAB — CBC WITH DIFFERENTIAL/PLATELET
BASOS PCT: 0 %
Basophils Absolute: 0 10*3/uL (ref 0.0–0.1)
EOS PCT: 0 %
Eosinophils Absolute: 0 10*3/uL (ref 0.0–0.7)
HEMATOCRIT: 25.2 % — AB (ref 39.0–52.0)
HEMOGLOBIN: 8.5 g/dL — AB (ref 13.0–17.0)
LYMPHS ABS: 2.8 10*3/uL (ref 0.7–4.0)
Lymphocytes Relative: 17 %
MCH: 36.8 pg — ABNORMAL HIGH (ref 26.0–34.0)
MCHC: 33.7 g/dL (ref 30.0–36.0)
MCV: 109.1 fL — AB (ref 78.0–100.0)
MONO ABS: 1.6 10*3/uL — AB (ref 0.1–1.0)
MONOS PCT: 10 %
Neutro Abs: 11.9 10*3/uL — ABNORMAL HIGH (ref 1.7–7.7)
Neutrophils Relative %: 73 %
Platelets: 126 10*3/uL — ABNORMAL LOW (ref 150–400)
RBC: 2.31 MIL/uL — AB (ref 4.22–5.81)
RDW: 22.5 % — ABNORMAL HIGH (ref 11.5–15.5)
WBC: 16.3 10*3/uL — AB (ref 4.0–10.5)

## 2015-05-21 LAB — APTT: aPTT: 33 seconds (ref 24–37)

## 2015-05-21 LAB — POCT ACTIVATED CLOTTING TIME
ACTIVATED CLOTTING TIME: 135 s
ACTIVATED CLOTTING TIME: 165 s
ACTIVATED CLOTTING TIME: 171 s
Activated Clotting Time: 110 seconds
Activated Clotting Time: 171 seconds
Activated Clotting Time: 171 seconds

## 2015-05-21 MED ORDER — HEPARIN SODIUM (PORCINE) 5000 UNIT/ML IJ SOLN
5000.0000 [IU] | Freq: Three times a day (TID) | INTRAMUSCULAR | Status: DC
  Administered 2015-05-21 (×2): 5000 [IU] via SUBCUTANEOUS
  Filled 2015-05-21 (×3): qty 1

## 2015-05-21 MED ORDER — HEPARIN BOLUS VIA INFUSION (CRRT)
1000.0000 [IU] | INTRAVENOUS | Status: DC | PRN
  Administered 2015-05-21: 1000 [IU] via INTRAVENOUS_CENTRAL
  Filled 2015-05-21 (×2): qty 1000

## 2015-05-21 MED ORDER — SODIUM CHLORIDE 0.9 % IJ SOLN
250.0000 [IU]/h | INTRAMUSCULAR | Status: DC
  Administered 2015-05-21: 250 [IU]/h via INTRAVENOUS_CENTRAL
  Administered 2015-05-22: 850 [IU]/h via INTRAVENOUS_CENTRAL
  Filled 2015-05-21 (×2): qty 2

## 2015-05-21 MED ORDER — CARVEDILOL 3.125 MG PO TABS
3.1250 mg | ORAL_TABLET | Freq: Two times a day (BID) | ORAL | Status: DC
  Administered 2015-05-21 – 2015-05-29 (×15): 3.125 mg via ORAL
  Filled 2015-05-21 (×15): qty 1

## 2015-05-21 NOTE — Progress Notes (Signed)
[name] for Infectious Disease    Subjective:  No complaints, "I'm doing alright"  Antibiotics:  Anti-infectives    Start     Dose/Rate Route Frequency Ordered Stop   05/18/15 1300  cefTRIAXone (ROCEPHIN) 2 g in dextrose 5 % 50 mL IVPB     2 g 100 mL/hr over 30 Minutes Intravenous Every 24 hours 05/18/15 1143     05/15/15 1600  vancomycin (VANCOCIN) IVPB 1000 mg/200 mL premix  Status:  Discontinued     1,000 mg 200 mL/hr over 60 Minutes Intravenous Every 48 hours 05/13/15 1507 05/13/15 2028   05/14/15 2200  vancomycin (VANCOCIN) IVPB 1000 mg/200 mL premix  Status:  Discontinued     1,000 mg 200 mL/hr over 60 Minutes Intravenous Every 24 hours 05/14/15 0633 05/14/15 0640   05/14/15 1200  piperacillin-tazobactam (ZOSYN) IVPB 2.25 g  Status:  Discontinued     2.25 g 100 mL/hr over 30 Minutes Intravenous 4 times per day 05/14/15 0910 05/14/15 1008   05/14/15 1200  piperacillin-tazobactam (ZOSYN) IVPB 3.375 g  Status:  Discontinued     3.375 g 100 mL/hr over 30 Minutes Intravenous 4 times per day 05/14/15 1008 05/14/15 1028   05/14/15 1200  imipenem-cilastatin (PRIMAXIN) 500 mg in sodium chloride 0.9 % 100 mL IVPB  Status:  Discontinued     500 mg 200 mL/hr over 30 Minutes Intravenous 4 times per day 05/14/15 1028 05/18/15 1127   05/13/15 2200  piperacillin-tazobactam (ZOSYN) IVPB 3.375 g  Status:  Discontinued     3.375 g 12.5 mL/hr over 240 Minutes Intravenous 3 times per day 05/13/15 1507 05/14/15 0910   05/13/15 2200  vancomycin (VANCOCIN) IVPB 1000 mg/200 mL premix  Status:  Discontinued     1,000 mg 200 mL/hr over 60 Minutes Intravenous Every 48 hours 05/13/15 2028 05/14/15 0631   05/13/15 1430  piperacillin-tazobactam (ZOSYN) IVPB 3.375 g  Status:  Discontinued     3.375 g 100 mL/hr over 30 Minutes Intravenous  Once 05/13/15 1428 05/13/15 2028   05/13/15 1430  vancomycin (VANCOCIN) IVPB 1000 mg/200 mL premix  Status:  Discontinued     1,000  mg 200 mL/hr over 60 Minutes Intravenous  Once 05/13/15 1428 05/13/15 2028   05/07/15 0700  vancomycin (VANCOCIN) IVPB 1000 mg/200 mL premix  Status:  Discontinued     1,000 mg 200 mL/hr over 60 Minutes Intravenous Every 24 hours 05/07/15 0611 05/08/15 1438   05/04/15 1100  piperacillin-tazobactam (ZOSYN) IVPB 3.375 g  Status:  Discontinued     3.375 g 12.5 mL/hr over 240 Minutes Intravenous 3 times per day 05/04/15 1003 05/08/15 1438   05/04/15 0600  vancomycin (VANCOCIN) IVPB 750 mg/150 ml premix  Status:  Discontinued     750 mg 150 mL/hr over 60 Minutes Intravenous Every 24 hours 05/04/15 0557 05/07/15 0610   05/03/15 2200  clindamycin (CLEOCIN) IVPB 600 mg  Status:  Discontinued     600 mg 100 mL/hr over 30 Minutes Intravenous 3 times per day 05/03/15 1524 05/04/15 0225   05/03/15 1330  clindamycin (CLEOCIN) IVPB 600 mg     600 mg 100 mL/hr over 30 Minutes Intravenous  Once 05/03/15 1326 05/03/15 1457      Medications: Scheduled Meds: . antiseptic oral rinse  7 mL Mouth Rinse QID  . aspirin  81 mg Oral Daily  . carvedilol  3.125 mg Oral BID WC  . cefTRIAXone (ROCEPHIN)  IV  2 g Intravenous Q24H  . chlorhexidine gluconate  15 mL Mouth Rinse BID  . clopidogrel  75 mg Oral Daily  . diphenhydrAMINE  25 mg Per Tube 3 times per day  . famotidine  20 mg Oral Daily  . heparin subcutaneous  5,000 Units Subcutaneous 3 times per day  . hydrocortisone sodium succinate  25 mg Intravenous Q12H  . insulin aspart  0-9 Units Subcutaneous 6 times per day  . sodium chloride  3 mL Intravenous Q12H   Continuous Infusions: . dextrose 25 mL/hr at 05/20/15 1604  . heparin 10,000 units/ 20 mL infusion syringe Stopped (05/20/15 0700)  . dialysis replacement fluid (prismasate) 200 mL/hr at 05/20/15 1945  . dialysis replacement fluid (prismasate) 400 mL/hr at 05/21/15 0913  . dialysate (PRISMASATE) 1,500 mL/hr at 05/21/15 1251   PRN Meds:.[CANCELED] Place/Maintain arterial line **AND** sodium  chloride, acetaminophen **OR** acetaminophen, fentaNYL (SUBLIMAZE) injection, heparin, heparin, heparin, ondansetron **OR** ondansetron (ZOFRAN) IV, sodium chloride, technetium TC 44M mebrofenin    Objective: Weight change: -10.6 oz (-0.3 kg)  Intake/Output Summary (Last 24 hours) at 05/21/15 1501 Last data filed at 05/21/15 1400  Gross per 24 hour  Intake    705 ml  Output   2353 ml  Net  -1648 ml   Blood pressure 127/83, pulse 122, temperature 95.7 F (35.4 C), temperature source Core (Comment), resp. rate 15, height '5\' 8"'  (1.727 m), weight 171 lb 8.3 oz (77.8 kg), SpO2 100 %. Temp:  [95.7 F (35.4 C)-97.5 F (36.4 C)] 95.7 F (35.4 C) (11/03 1332) Pulse Rate:  [45-142] 122 (11/03 1332) Resp:  [8-23] 15 (11/03 1332) BP: (104-150)/(64-121) 127/83 mmHg (11/03 1300) SpO2:  [93 %-100 %] 100 % (11/03 1332) Weight:  [171 lb 8.3 oz (77.8 kg)] 171 lb 8.3 oz (77.8 kg) (11/03 0500)  Physical Exam: General: extubated alert oriented to person, recognized family HEENT: icteric sclera,  EOMI CVS tachycardic, regular rate, normal r,  no murmur rubs or gallops Chest:  rhonchi Abdomen: soft  nondistended, normal bowel sounds, Extremities: 2+ edema Skin: rash he had previously has improved with bathing int he ICU it seems, still present on feet Neuro: nonfocal  CBC: CBC Latest Ref Rng 05/21/2015 05/20/2015 05/19/2015  WBC 4.0 - 10.5 K/uL 16.3(H) 15.5(H) 16.1(H)  Hemoglobin 13.0 - 17.0 g/dL 8.5(L) 8.3(L) 8.7(L)  Hematocrit 39.0 - 52.0 % 25.2(L) 25.0(L) 26.3(L)  Platelets 150 - 400 K/uL 126(L) 128(L) 127(L)       BMET  Recent Labs  05/20/15 1610 05/21/15 0420  NA 137 138  K 3.7 4.1  CL 102 105  CO2 28 28  GLUCOSE 111* 96  BUN 17 17  CREATININE 1.13 1.27*  CALCIUM 8.4* 8.8*     Liver Panel   Recent Labs  05/20/15 0515 05/20/15 1610 05/21/15 0420  PROT 5.7*  --   --   ALBUMIN 1.8*  1.9* 2.1* 2.3*  AST 222*  --   --   ALT 178*  --   --   ALKPHOS 236*  --   --     BILITOT 10.4*  --   --   BILIDIR 6.5*  --   --   IBILI 3.9*  --   --        Sedimentation Rate No results for input(s): ESRSEDRATE in the last 72 hours. C-Reactive Protein No results for input(s): CRP in the last 72 hours.  Micro Results: Recent Results (from the past 720 hour(s))  Culture, blood (routine x 2)  Status: None   Collection Time: 05/04/15  2:50 AM  Result Value Ref Range Status   Specimen Description BLOOD RIGHT ARM  Final   Special Requests BOTTLES DRAWN AEROBIC AND ANAEROBIC [place]  Final   Culture   Final    NO GROWTH 5 DAYS Performed at [facility]    Report Status 05/09/2015 FINAL  Final  Culture, blood (routine x 2)     Status: None   Collection Time: 05/04/15  2:55 AM  Result Value Ref Range Status   Specimen Description BLOOD RIGHT HAND  Final   Special Requests BOTTLES DRAWN AEROBIC ONLY 10CC  Final   Culture   Final    NO GROWTH 5 DAYS Performed at [facility]    Report Status 05/09/2015 FINAL  Final  Culture, Urine     Status: None   Collection Time: 05/04/15 10:57 PM  Result Value Ref Range Status   Specimen Description URINE, RANDOM  Final   Special Requests NONE  Final   Culture   Final    NO GROWTH 1 DAY Performed at [facility]    Report Status 05/06/2015 FINAL  Final  Culture, Urine     Status: None   Collection Time: 05/11/15  4:21 PM  Result Value Ref Range Status   Specimen Description URINE, CLEAN CATCH  Final   Special Requests Normal  Final   Culture   Final    NO GROWTH 2 DAYS Performed at [facility]    Report Status 05/13/2015 FINAL  Final  Tissue culture     Status: None   Collection Time: 05/12/15  9:50 AM  Result Value Ref Range Status   Specimen Description BIOPSY BONE MARROW  Final   Special Requests NONE  Final   Gram Stain   Final    MODERATE WBC PRESENT,BOTH PMN AND MONONUCLEAR NO SQUAMOUS EPITHELIAL CELLS SEEN NO ORGANISMS SEEN Performed at [facility]     Culture   Final    FEW ESCHERICHIA COLI Performed at [facility]    Report Status 05/14/2015 FINAL  Final   Organism ID, Bacteria ESCHERICHIA COLI  Final      Susceptibility   Escherichia coli - MIC*    AMPICILLIN >=32 RESISTANT Resistant     AMPICILLIN/SULBACTAM >=32 RESISTANT Resistant     CEFAZOLIN >=64 RESISTANT Resistant     CEFEPIME <=1 SENSITIVE Sensitive     CEFTAZIDIME <=1 SENSITIVE Sensitive     CEFTRIAXONE <=1 SENSITIVE Sensitive     CIPROFLOXACIN <=0.25 SENSITIVE Sensitive     GENTAMICIN <=1 SENSITIVE Sensitive     IMIPENEM <=0.25 SENSITIVE Sensitive     PIP/TAZO 8 SENSITIVE Sensitive     TOBRAMYCIN <=1 SENSITIVE Sensitive     TRIMETH/SULFA Value in next row Sensitive      <=20 SENSITIVE(NOTE)    * FEW ESCHERICHIA COLI  Fungus culture w smear     Status: None (Preliminary result)   Collection Time: 05/12/15  9:50 AM  Result Value Ref Range Status   Specimen Description BIOPSY BONE MARROW  Final   Special Requests NONE  Final   Fungal Smear   Final    NO YEAST OR FUNGAL ELEMENTS SEEN Performed at [facility]    Culture   Final    CULTURE IN PROGRESS FOR FOUR WEEKS Performed at [facility]    Report Status PENDING  Incomplete  AFB culture with smear     Status: None (Preliminary  result)   Collection Time: 05/12/15  9:50 AM  Result Value Ref Range Status   Specimen Description BIOPSY BONE MARROW  Final   Special Requests NONE  Final   Acid Fast Smear   Final    NO ACID FAST BACILLI SEEN Performed at [facility]    Culture   Final    CULTURE WILL BE EXAMINED FOR 6 WEEKS BEFORE ISSUING A FINAL REPORT Performed at [facility]    Report Status PENDING  Incomplete  MRSA PCR Screening     Status: None   Collection Time: 05/13/15  2:59 PM  Result Value Ref Range Status   MRSA by PCR NEGATIVE NEGATIVE Final    Comment:        The GeneXpert MRSA Assay (FDA approved for NASAL specimens only), is one component of  a comprehensive MRSA colonization surveillance program. It is not intended to diagnose MRSA infection nor to guide or monitor treatment for MRSA infections.   Culture, blood (x 2)     Status: None   Collection Time: 05/13/15  4:05 PM  Result Value Ref Range Status   Specimen Description BLOOD RIGHT ARM  Final   Special Requests BOTTLES DRAWN AEROBIC AND ANAEROBIC  5CC  Final   Culture  Setup Time   Final    GRAM NEGATIVE RODS AEROBIC BOTTLE ONLY CRITICAL RESULT CALLED TO, READ BACK BY AND VERIFIED WITH: L FREI,RN AT [id number] 05/14/15 BY L BENFIELD    Culture   Final    ENTEROBACTER CLOACAE Performed at [facility]    Report Status 05/16/2015 FINAL  Final   Organism ID, Bacteria ENTEROBACTER CLOACAE  Final      Susceptibility   Enterobacter cloacae - MIC*    CEFAZOLIN >=64 RESISTANT Resistant     CEFEPIME <=1 SENSITIVE Sensitive     CEFTAZIDIME <=1 SENSITIVE Sensitive     CEFTRIAXONE <=1 SENSITIVE Sensitive     CIPROFLOXACIN <=0.25 SENSITIVE Sensitive     GENTAMICIN <=1 SENSITIVE Sensitive     IMIPENEM 1 SENSITIVE Sensitive     TRIMETH/SULFA <=20 SENSITIVE Sensitive     PIP/TAZO <=4 SENSITIVE Sensitive     * ENTEROBACTER CLOACAE  Culture, blood (x 2)     Status: None   Collection Time: 05/13/15  4:24 PM  Result Value Ref Range Status   Specimen Description BLOOD RIGHT ARM  Final   Special Requests BOTTLES DRAWN AEROBIC AND ANAEROBIC  5CC  Final   Culture  Setup Time   Final    GRAM NEGATIVE RODS IN BOTH AEROBIC AND ANAEROBIC BOTTLES CRITICAL RESULT CALLED TO, READ BACK BY AND VERIFIED WITH: S TROTS '@0600'  05/14/15 MKELLY    Culture   Final    ENTEROBACTER CLOACAE SUSCEPTIBILITIES PERFORMED ON PREVIOUS CULTURE WITHIN THE LAST 5 DAYS. Performed at [facility]    Report Status 05/16/2015 FINAL  Final  Ova and parasite examination     Status: None   Collection Time: 05/14/15  4:25 PM  Result Value Ref Range Status   Specimen Description STOOL  Final    Special Requests NONE  Final   Ova and parasites   Final    NO OVA OR PARASITES SEEN Performed at [facility]    Report Status 05/15/2015 FINAL  Final  Stool culture     Status: None   Collection Time: 05/14/15  4:25 PM  Result Value Ref Range Status   Specimen Description STOOL  Final   Special Requests  NONE  Final   Culture   Final    NO SALMONELLA, SHIGELLA, CAMPYLOBACTER, YERSINIA, OR E.COLI [id number]:[id number] ISOLATED Performed at [facility]    Report Status 05/18/2015 FINAL  Final  Culture, respiratory (NON-Expectorated)     Status: None   Collection Time: 05/18/15 12:30 PM  Result Value Ref Range Status   Specimen Description BRONCHIAL ALVEOLAR LAVAGE  Final   Special Requests Normal  Final   Gram Stain   Final    ABUNDANT WBC PRESENT, PREDOMINANTLY PMN NO SQUAMOUS EPITHELIAL CELLS SEEN NO ORGANISMS SEEN Performed at [facility]    Culture   Final    RARE ESCHERICHIA COLI Performed at [facility]    Report Status 05/21/2015 FINAL  Final   Organism ID, Bacteria ESCHERICHIA COLI  Final      Susceptibility   Escherichia coli - MIC*    AMPICILLIN >=32 RESISTANT Resistant     AMPICILLIN/SULBACTAM >=32 RESISTANT Resistant     CEFEPIME <=1 SENSITIVE Sensitive     CEFTAZIDIME <=1 SENSITIVE Sensitive     CEFTRIAXONE <=1 SENSITIVE Sensitive     CIPROFLOXACIN 0.5 SENSITIVE Sensitive     GENTAMICIN >=16 RESISTANT Resistant     IMIPENEM <=0.25 SENSITIVE Sensitive     PIP/TAZO <=4 SENSITIVE Sensitive     TOBRAMYCIN 8 INTERMEDIATE Intermediate     TRIMETH/SULFA Value in next row Sensitive      <=20 SENSITIVE(NOTE)    * RARE ESCHERICHIA COLI  Fungus Culture with Smear     Status: None (Preliminary result)   Collection Time: 05/18/15 12:30 PM  Result Value Ref Range Status   Specimen Description BRONCHIAL ALVEOLAR LAVAGE  Final   Special Requests Normal  Final   Fungal Smear   Final    NO YEAST OR FUNGAL ELEMENTS SEEN Performed at [facility]    Culture   Final    CULTURE IN PROGRESS FOR FOUR WEEKS Performed at [facility]    Report Status PENDING  Incomplete  AFB culture with smear     Status: None (Preliminary result)   Collection Time: 05/18/15 12:30 PM  Result Value Ref Range Status   Specimen Description BRONCHIAL ALVEOLAR LAVAGE  Final   Special Requests Normal  Final   Acid Fast Smear   Final    NO ACID FAST BACILLI SEEN Performed at [facility]    Culture   Final    CULTURE WILL BE EXAMINED FOR 6 WEEKS BEFORE ISSUING A FINAL REPORT Performed at [facility]    Report Status PENDING  Incomplete    Studies/Results: No results found.    Assessment/Plan:  INTERVAL HISTORY:   05/18/15: pt sp bronchoscopy but BAL for O&P lost 05/20/15: pt self extubated and now off pressors 05/21/15: BAL grew sensitive E coli  Principal Problem:   Sepsis due to cellulitis [facility]) Active Problems:   Homelessness   Neck swelling   Acute renal failure superimposed on stage 3 chronic kidney disease (HCC)   Chronic combined systolic (congestive) and diastolic (congestive) heart failure (HCC)   Benign essential HTN   History of non-ST elevation myocardial infarction (NSTEMI)   Hyponatremia   Abnormal LFTs   Polysubstance abuse   Noncompliance   Leukocytosis   Cellulitis of neck   Dysphagia   Elevated LFTs   Submandibular gland infection   Urticaria   Rash and nonspecific skin eruption   Dry mouth   Dyspnea   FUO (fever of unknown origin)  Acute respiratory failure (HCC)   Renal failure (ARF), acute on chronic (HCC)   Sepsis ([place])   Endotracheal tube present   Transaminitis   Septic shock (HCC)   Multiorgan failure   Gram-negative bacteremia (HCC)   Infection caused by Enterobacter cloacae   E coli infection   Eosinophilia   Hyperbilirubinemia    [name] is a 59 y.o. male with  Enterobacter bacteremia and septic shock with multi-organ failure, has been on pressors,  CVVDH, with  previous E coli on bone marrow biopsy which also showed eosinophils as did his peripheral blood.  #1 Septic shock due Gram negative Enterobacter sepsis: Improving off pressors, extubated still on CVVVHD  --continue rocephin 2 grams daily  #2 Bronchial secretions on BAL: sensitive E coli and covered  #3 E coli on bone marrow biopsy: not clear how this came about, could be a contaminant  #4 Eosinophilia: still a mystery, O&P negative, strongyloides antibody negative in serum and now eosinophilia resolved but on steroids. I would wean off the steroids  Perhaps when he is "out of the woods form his septic shock, need for CVVHD we can look for an colonosocopy.   I am not sure whether the E coli in t bone marrow was real vs contaminant  but we will be covering  him   #5 Hyperbilirubinemia: due to shock liver on chronic liver disease  #6 Renal failure: on CVVHD    LOS: 17 days   Alcide Evener 05/21/2015, 3:01 PM

## 2015-05-21 NOTE — Progress Notes (Signed)
[facility] Progress Note   Subjective: UOP remains poor.  Net UF yest was 1.3 L negative  Filed Vitals:   05/18/15 1000 05/18/15 1015 05/18/15 1030 05/18/15 1141  BP: 89/56  84/47 107/67  Pulse: 60 55 57 63  Temp: 96.6 F (35.9 C) 96.4 F (35.8 C) 96.4 F (35.8 C)   TempSrc:      Resp: 16 14 14 14   Height:      Weight:      SpO2: 100% 99% 99% 100%   Exam: Awake, weak, responsive minimally  No jvd Chest clear bilat ant and lat RRR no MRG Abd soft dec'd BS no gross ascites GU foley brown urine in small amts 2+ bilat pitting edema of the LE's and UE's Neuro nonfocal, weak, tired, responsive  UA 10/26 - 100 prot, 3-6 wbc, 7-10 rbc UNa 19 UCr 236 Urine M-spike is negative CXR 10/29 no active disease  Summary: 41 AAM w hx CKD III, HTN, dilated CM (ischemic), polysubstance abuse and homelessness admitted with recent ED visit 10.13 for n/v/, sore throat, diarrhea and rash rx'd as allergic rxn. Admitted then on 10.16 for neck/ facial swelling, cough, sore throat and hives. Eosinophilia on admission and ^LFT's of unclear etiology. Had BM bx on 10/25. BM cx's grew ECOli and blood cx's ended up growing enterobacter cloacae.    Assessment: 1 AKI due to sepsis/ shock / ATN. Off pressors but still sig edema and S3 gallop suggesting decomp CHF.  Plan increase UF to 100-200 cc/ hr with CRRT. May be able to transition soon to intermittent HD at [facility].  2 CKD stage III, baseline creat 1.4- 1.8 3 ID on abx for GNR in blood and bone marrow cx's- on Rocephin 4 Shock - improving 5 ^LFT's supportive care 6 Hx dilated CM EF 25%  Plan - cont CRRT; increase UF, resume hep IV  Kelly Splinter MD [facility] pager [phone] cell [phone] 05/19/2015, 2:42 PM    Last Labs      Recent Labs Lab 05/17/15 0500 05/17/15 1600 05/18/15 0415  NA 136 137 137  K 4.4 4.0 3.7  CL 105 105 104  CO2  27 28 27   GLUCOSE 145* 112* 130*  BUN 19 14 20   CREATININE 1.47* 1.05 1.31*  CALCIUM 7.0* 7.3* 7.6*  PHOS 3.3 2.7 2.9      Last Labs      Recent Labs Lab 05/15/15 0400  05/16/15 0415  05/17/15 0500 05/17/15 1600 05/18/15 0415  AST 981* --  572* --  --  --  386*  ALT 621* --  422* --  --  --  262*  ALKPHOS 211* --  170* --  --  --  186*  BILITOT 9.0* --  9.4* --  --  --  12.6*  PROT 5.7* --  5.2* --  --  --  5.6*  ALBUMIN 2.0*  2.1* < > 1.8* < > 1.8* 1.8* 1.8*  1.8*  < > = values in this interval not displayed.    Last Labs      Recent Labs Lab 05/16/15 0415 05/17/15 0500 05/18/15 0415  WBC 20.4* 20.2* 20.5*  NEUTROABS 17.0* 14.6* 13.4*  HGB 10.6* 9.4* 9.0*  HCT 30.7* 27.6* 25.7*  MCV 107.0* 103.8* 108.0*  PLT 152 146* 146*     . antiseptic oral rinse 7 mL Mouth Rinse QID  . aspirin 81 mg Oral Daily  . cefTRIAXone (ROCEPHIN) IV 2 g Intravenous Q24H  . chlorhexidine gluconate  15 mL Mouth Rinse BID  . clopidogrel 75 mg Oral Daily  . diphenhydrAMINE 25 mg Per Tube 4 times per day  . famotidine 20 mg Oral Daily  . feeding supplement (NEPRO CARB STEADY) 1,000 mL Per Tube Q24H  . fentaNYL (SUBLIMAZE) injection 50 mcg Intravenous Once  . hydrocortisone sodium succinate 100 mg Intravenous Q12H  . insulin aspart 0-9 Units Subcutaneous 6 times per day  . mirabegron ER 25 mg Oral Daily  . sodium chloride 3 mL Intravenous Q12H   . dextrose 5 % and 0.45% NaCl 25 mL/hr at 05/18/15 1300  . fentaNYL infusion INTRAVENOUS 100 mcg/hr (05/18/15 1300)  . heparin 10,000 units/ 20 mL infusion syringe 700 Units/hr (05/18/15 1300)  . norepinephrine (LEVOPHED) Adult infusion 4.053 mcg/min (05/18/15 1300)  . dialysis replacement fluid (prismasate) 200 mL/hr at  05/17/15 1220  . dialysis replacement fluid (prismasate) 400 mL/hr at 05/18/15 0149  . dialysate (PRISMASATE) 1,500 mL/hr at 05/18/15 1012   [CANCELED] Place/Maintain arterial line **AND** sodium chloride, acetaminophen **OR** acetaminophen, fentaNYL, heparin, heparin, heparin, LORazepam, ondansetron **OR** ondansetron (ZOFRAN) IV, sodium chloride, technetium TC 70M mebrofenin

## 2015-05-21 NOTE — Progress Notes (Signed)
ANTIBIOTIC CONSULT NOTE - Follow up  Pharmacy Consult for Rocephin Indication: Sepsis  No Known Allergies  Patient Measurements: Height: 5\' 8"  (172.7 cm) Weight: 171 lb 8.3 oz (77.8 kg) IBW/kg (Calculated) : 68.4  Vital Signs: Temp: 97.2 F (36.2 C) (11/03 0900) Temp Source: Core (Comment) (11/03 0900) BP: 117/76 mmHg (11/03 0900) Pulse Rate: 90 (11/03 0800) Intake/Output from previous day: 11/02 0701 - 11/03 0700 In: 881.5 [P.O.:20; I.V.:636.5; NG/GT:165; IV Piggyback:50] Out: 2160 [Urine:205] Intake/Output from this shift: Total I/O In: 50 [I.V.:50] Out: 222 [Urine:25; Other:197]  Labs:  Recent Labs  05/19/15 0449  05/20/15 0515 05/20/15 1610 05/21/15 0420  WBC 16.1*  --  15.5*  --  16.3*  HGB 8.7*  --  8.3*  --  8.5*  PLT 127*  --  128*  --  126*  CREATININE 1.26*  < > 1.25* 1.13 1.27*  < > = values in this interval not displayed. Estimated Creatinine Clearance: 59.1 mL/min (by C-G formula based on Cr of 1.27). No results for input(s): VANCOTROUGH, VANCOPEAK, VANCORANDOM, GENTTROUGH, GENTPEAK, GENTRANDOM, TOBRATROUGH, TOBRAPEAK, TOBRARND, AMIKACINPEAK, AMIKACINTROU, AMIKACIN in the last 72 hours.   Microbiology: Recent Results (from the past 720 hour(s))  Culture, blood (routine x 2)     Status: None   Collection Time: 05/04/15  2:50 AM  Result Value Ref Range Status   Specimen Description BLOOD RIGHT ARM  Final   Special Requests BOTTLES DRAWN AEROBIC AND ANAEROBIC [place]  Final   Culture   Final    NO GROWTH 5 DAYS Performed at [facility]    Report Status 05/09/2015 FINAL  Final  Culture, blood (routine x 2)     Status: None   Collection Time: 05/04/15  2:55 AM  Result Value Ref Range Status   Specimen Description BLOOD RIGHT HAND  Final   Special Requests BOTTLES DRAWN AEROBIC ONLY 10CC  Final   Culture   Final    NO GROWTH 5 DAYS Performed at [facility]    Report Status 05/09/2015 FINAL  Final  Culture, Urine     Status: None    Collection Time: 05/04/15 10:57 PM  Result Value Ref Range Status   Specimen Description URINE, RANDOM  Final   Special Requests NONE  Final   Culture   Final    NO GROWTH 1 DAY Performed at [facility]    Report Status 05/06/2015 FINAL  Final  Culture, Urine     Status: None   Collection Time: 05/11/15  4:21 PM  Result Value Ref Range Status   Specimen Description URINE, CLEAN CATCH  Final   Special Requests Normal  Final   Culture   Final    NO GROWTH 2 DAYS Performed at [facility]    Report Status 05/13/2015 FINAL  Final  Tissue culture     Status: None   Collection Time: 05/12/15  9:50 AM  Result Value Ref Range Status   Specimen Description BIOPSY BONE MARROW  Final   Special Requests NONE  Final   Gram Stain   Final    MODERATE WBC PRESENT,BOTH PMN AND MONONUCLEAR NO SQUAMOUS EPITHELIAL CELLS SEEN NO ORGANISMS SEEN Performed at [facility]    Culture   Final    FEW ESCHERICHIA COLI Performed at [facility]    Report Status 05/14/2015 FINAL  Final   Organism ID, Bacteria ESCHERICHIA COLI  Final      Susceptibility   Escherichia coli - MIC*  AMPICILLIN >=32 RESISTANT Resistant     AMPICILLIN/SULBACTAM >=32 RESISTANT Resistant     CEFAZOLIN >=64 RESISTANT Resistant     CEFEPIME <=1 SENSITIVE Sensitive     CEFTAZIDIME <=1 SENSITIVE Sensitive     CEFTRIAXONE <=1 SENSITIVE Sensitive     CIPROFLOXACIN <=0.25 SENSITIVE Sensitive     GENTAMICIN <=1 SENSITIVE Sensitive     IMIPENEM <=0.25 SENSITIVE Sensitive     PIP/TAZO 8 SENSITIVE Sensitive     TOBRAMYCIN <=1 SENSITIVE Sensitive     TRIMETH/SULFA Value in next row Sensitive      <=20 SENSITIVE(NOTE)    * FEW ESCHERICHIA COLI  Fungus culture w smear     Status: None (Preliminary result)   Collection Time: 05/12/15  9:50 AM  Result Value Ref Range Status   Specimen Description BIOPSY BONE MARROW  Final   Special Requests NONE  Final   Fungal Smear   Final    NO YEAST OR FUNGAL  ELEMENTS SEEN Performed at [facility]    Culture   Final    CULTURE IN PROGRESS FOR FOUR WEEKS Performed at [facility]    Report Status PENDING  Incomplete  AFB culture with smear     Status: None (Preliminary result)   Collection Time: 05/12/15  9:50 AM  Result Value Ref Range Status   Specimen Description BIOPSY BONE MARROW  Final   Special Requests NONE  Final   Acid Fast Smear   Final    NO ACID FAST BACILLI SEEN Performed at [facility]    Culture   Final    CULTURE WILL BE EXAMINED FOR 6 WEEKS BEFORE ISSUING A FINAL REPORT Performed at [facility]    Report Status PENDING  Incomplete  MRSA PCR Screening     Status: None   Collection Time: 05/13/15  2:59 PM  Result Value Ref Range Status   MRSA by PCR NEGATIVE NEGATIVE Final    Comment:        The GeneXpert MRSA Assay (FDA approved for NASAL specimens only), is one component of a comprehensive MRSA colonization surveillance program. It is not intended to diagnose MRSA infection nor to guide or monitor treatment for MRSA infections.   Culture, blood (x 2)     Status: None   Collection Time: 05/13/15  4:05 PM  Result Value Ref Range Status   Specimen Description BLOOD RIGHT ARM  Final   Special Requests BOTTLES DRAWN AEROBIC AND ANAEROBIC  5CC  Final   Culture  Setup Time   Final    GRAM NEGATIVE RODS AEROBIC BOTTLE ONLY CRITICAL RESULT CALLED TO, READ BACK BY AND VERIFIED WITH: L FREI,RN AT [id number] 05/14/15 BY L BENFIELD    Culture   Final    ENTEROBACTER CLOACAE Performed at [facility]    Report Status 05/16/2015 FINAL  Final   Organism ID, Bacteria ENTEROBACTER CLOACAE  Final      Susceptibility   Enterobacter cloacae - MIC*    CEFAZOLIN >=64 RESISTANT Resistant     CEFEPIME <=1 SENSITIVE Sensitive     CEFTAZIDIME <=1 SENSITIVE Sensitive     CEFTRIAXONE <=1 SENSITIVE Sensitive     CIPROFLOXACIN <=0.25 SENSITIVE Sensitive     GENTAMICIN <=1 SENSITIVE  Sensitive     IMIPENEM 1 SENSITIVE Sensitive     TRIMETH/SULFA <=20 SENSITIVE Sensitive     PIP/TAZO <=4 SENSITIVE Sensitive     * ENTEROBACTER CLOACAE  Culture, blood (x 2)  Status: None   Collection Time: 05/13/15  4:24 PM  Result Value Ref Range Status   Specimen Description BLOOD RIGHT ARM  Final   Special Requests BOTTLES DRAWN AEROBIC AND ANAEROBIC  5CC  Final   Culture  Setup Time   Final    GRAM NEGATIVE RODS IN BOTH AEROBIC AND ANAEROBIC BOTTLES CRITICAL RESULT CALLED TO, READ BACK BY AND VERIFIED WITH: S TROTS @0600  05/14/15 MKELLY    Culture   Final    ENTEROBACTER CLOACAE SUSCEPTIBILITIES PERFORMED ON PREVIOUS CULTURE WITHIN THE LAST 5 DAYS. Performed at [facility]    Report Status 05/16/2015 FINAL  Final  Ova and parasite examination     Status: None   Collection Time: 05/14/15  4:25 PM  Result Value Ref Range Status   Specimen Description STOOL  Final   Special Requests NONE  Final   Ova and parasites   Final    NO OVA OR PARASITES SEEN Performed at [facility]    Report Status 05/15/2015 FINAL  Final  Stool culture     Status: None   Collection Time: 05/14/15  4:25 PM  Result Value Ref Range Status   Specimen Description STOOL  Final   Special Requests NONE  Final   Culture   Final    NO SALMONELLA, SHIGELLA, CAMPYLOBACTER, YERSINIA, OR E.COLI 0157:H7 ISOLATED Performed at [facility]    Report Status 05/18/2015 FINAL  Final  Culture, respiratory (NON-Expectorated)     Status: None   Collection Time: 05/18/15 12:30 PM  Result Value Ref Range Status   Specimen Description BRONCHIAL ALVEOLAR LAVAGE  Final   Special Requests Normal  Final   Gram Stain   Final    ABUNDANT WBC PRESENT, PREDOMINANTLY PMN NO SQUAMOUS EPITHELIAL CELLS SEEN NO ORGANISMS SEEN Performed at [facility]    Culture   Final    RARE ESCHERICHIA COLI Performed at [facility]    Report Status 05/21/2015 FINAL  Final   Organism ID,  Bacteria ESCHERICHIA COLI  Final      Susceptibility   Escherichia coli - MIC*    AMPICILLIN >=32 RESISTANT Resistant     AMPICILLIN/SULBACTAM >=32 RESISTANT Resistant     CEFEPIME <=1 SENSITIVE Sensitive     CEFTAZIDIME <=1 SENSITIVE Sensitive     CEFTRIAXONE <=1 SENSITIVE Sensitive     CIPROFLOXACIN 0.5 SENSITIVE Sensitive     GENTAMICIN >=16 RESISTANT Resistant     IMIPENEM <=0.25 SENSITIVE Sensitive     PIP/TAZO <=4 SENSITIVE Sensitive     TOBRAMYCIN 8 INTERMEDIATE Intermediate     TRIMETH/SULFA Value in next row Sensitive      <=20 SENSITIVE(NOTE)    * RARE ESCHERICHIA COLI  Fungus Culture with Smear     Status: None (Preliminary result)   Collection Time: 05/18/15 12:30 PM  Result Value Ref Range Status   Specimen Description BRONCHIAL ALVEOLAR LAVAGE  Final   Special Requests Normal  Final   Fungal Smear   Final    NO YEAST OR FUNGAL ELEMENTS SEEN Performed at [facility]    Culture   Final    CULTURE IN PROGRESS FOR FOUR WEEKS Performed at [facility]    Report Status PENDING  Incomplete  AFB culture with smear     Status: None (Preliminary result)   Collection Time: 05/18/15 12:30 PM  Result Value Ref Range Status   Specimen Description BRONCHIAL ALVEOLAR LAVAGE  Final   Special Requests Normal  Final   Acid Fast Smear   Final    NO ACID FAST BACILLI SEEN Performed at [facility]    Culture   Final    CULTURE WILL BE EXAMINED FOR 6 WEEKS BEFORE ISSUING A FINAL REPORT Performed at [facility]    Report Status PENDING  Incomplete    Assessment: 84 yoM on IV antibiotics for sepsis d/t cellulitis, enterobacter bacteremia, and E.Coli in bone marrow & sputum cultures.  He remains on CRRT.  Currently on Rocephin (total antibiotic day #9)  10/16 >> Clinda >> 10/16 10/17 >>Vanc >> 10/21, 10/26 >> 10/27 10/17 >> Zosyn >> 10/21, 10/26 >> 10/27 10/27 >> Primaxin >> 10/31 10/31 >> Rocephin >>  10/17 bloodx2: NGF 10/17 urine:  NGF 10/24 urine: NGF   10/25 AFB: NGTD 10/25 Fungus: NGTD 10/25 BM biopsy: Few E.Coli (R amp, unasyn, cefazolin, S cefepime, ceftaz, CTX, cipro, gent, primaxin, zosyn, tobra, bactrim) 10/26 blood: 2/2 enterobacter cloacae (R cefazoline, S cefepime, ceftaz, CTX, cipro, gent, primaxin, zosyn, bactrim) 10/27 O/P: negative 10/27 stool culture: no suspicious colonies, continuing to hold 10/29 blood 2/2: canceled - per lab, couldn't collect blood 10/31 BAL: Ecoli  Today, 05/21/2015: Temp: remains afebrile WBC: remains elevated (on steroids) Renal: CRRT, effluent target 20-43ml/kg/hr to equate to CrCl 25-50, oliguric 0.1-0.2 ml/kg/hr. PCT and LA trending down LFTs: improving  Goal of Therapy:  Eradication of infection  Plan:  Continue Rocephin 2g IV q24h. Follow up renal fxn, culture results, and clinical course.  Netta Cedars, PharmD, BCPS Pager: [phone]  05/21/2015,10:11 AM.

## 2015-05-21 NOTE — Evaluation (Addendum)
Clinical/Bedside Swallow Evaluation Patient Details  Name: [name] MRN: [id number] Date of Birth: [birth year]  Today's Date: 05/21/2015 Time: SLP Start Time (ACUTE ONLY): 25 SLP Stop Time (ACUTE ONLY): 1625 SLP Time Calculation (min) (ACUTE ONLY): 35 min  Past Medical History:  Past Medical History  Diagnosis Date  . Hypertension   . Homelessness   . Urinary hesitancy   . Hypercholesterolemia   . NSTEMI (non-ST elevated myocardial infarction) ([place]) 01/07/2015  . Heart attack ([place]) 07/2014  . Walking pneumonia 07/2014  . Headache     "probably weekly" (01/07/2015)  . Arthritis     "all over" (01/07/2015)  . Anxiety   . Depression   . Noncompliance   . Polysubstance abuse     etoh, cocaine  . Ischemic dilated cardiomyopathy   . CKD (chronic kidney disease), stage IV ([place])   . History of echocardiogram 12/2014    EF 20-25%   Past Surgical History:  Past Surgical History  Procedure Laterality Date  . Left heart catheterization with coronary angiogram N/A 08/15/2014    Procedure: LEFT HEART CATHETERIZATION WITH CORONARY ANGIOGRAM;  Surgeon: Clent Demark, MD;  Location: [facility] CATH LAB;  Service: Cardiovascular;  Laterality: N/A;  . Cardiac catheterization     HPI:  59 yo male adm to [facility] with neck edema.  Pt with h/o HAs, pna, NSTEMI, leukocytosis.  CT showed right submandibular gland asymmetrical enlargement during early admission.  CXR upon admit was negative.  Pt had difficulties with throat swelling in September as well as pt had xray completed at that time.  Pt reports difficulty swallowing foods and frequent expectoration of secretions.  He also pointed to midesophagus to indicate area of slow clearance during prior evaluation.  CT neck early in hospital coarse revealed  Diffuse subcutaneous edema - nonspecific and likely due to anasarca.  Pt had episode of respiratory deficits, nausea/vomiting and required intubation 10/26-11/2.   He self extubated 05/20/15.  Swallow  evaluation ordered, RN reports pt with episodes of nausea today but is tolerating clear liquids.     Assessment / Plan / Recommendation Clinical Impression  Pt's mentation is not as clear as when SLP saw pt on 05/04/15 for initial evaluation.  His voice is surprisingly strong despite 8 days of intubation and self extubation.  As RN reported pt with nausea today, SLP tested only liquids and single bite of applesauce.  CN exam largely unremarkable except lingual deviation to left upon protrusion, lingual weakness characterized by dysarthria.   Mildly excessive "mastication movements" labial/mandibular movement noted -? consistent with dyskinesia.   Palatal elevation appeared slightly deviated to right upon phonation.  No overt indication of aspiration with po observed and pt denied difficulty swallowing pill with thin liquid.    Recommend continue clears with assistance and strict precautions.  As pt reported odynophagia and dysphagia during intial evaluation10/17, extended intubation and deconditioning, SLP will follow up to assure tolerance and readiness for advancement.  Educated pt to findings/recommendations.      Aspiration Risk  Mild    Diet Recommendation Thin   Medication Administration: Whole meds with liquid Compensations: Slow rate;Small sips/bites    Other  Recommendations Oral Care Recommendations: Oral care BID   Follow Up Recommendations       Frequency and Duration min 1 x/week  1 week   Pertinent Vitals/Pain Afebrile, decreased      Swallow Study Prior Functional Status   see hhx    General Date of Onset: 05/21/15 Other Pertinent Information:  59 yo male adm to [facility] with neck edema.  Pt with h/o HAs, pna, NSTEMI, leukocytosis.  CT showed right submandibular gland asymmetrical enlargement during early admission.  CXR upon admit was negative.  Pt had difficulties with throat swelling in September as well as pt had xray completed at that time.  Pt reports difficulty  swallowing foods and frequent expectoration of secretions.  He also pointed to midesophagus to indicate area of slow clearance during prior evaluation.  CT neck early in hospital coarse revealed  Diffuse subcutaneous edema - nonspecific and likely due to anasarca.  Pt had episode of respiratory deficits, nausea/vomiting and required intubation 10/26-11/2.   He self extubated 05/20/15.  Swallow evaluation ordered, RN reports pt with episodes of nausea today but is tolerating clear liquids.   Type of Study: Bedside swallow evaluation Diet Prior to this Study: Thin liquids Respiratory Status: Supplemental O2 delivered via (comment) Behavior/Cognition: Alert;Cooperative;Pleasant mood Self-Feeding Abilities: Able to feed self (left handed- weak) Patient Positioning: Upright in bed Baseline Vocal Quality: Normal Volitional Cough: Other (Comment) (did not test due to pt being on dialysis) Volitional Swallow: Able to elicit    Oral/Motor/Sensory Function Overall Oral Motor/Sensory Function: Other (comment) (lingual protrusion - deviation slightly to the left upon protrusion, palatal elevation sluggish - ? pulling slightly to the right, contusion on hard palate - left, neck appears swollen slightly on right)   Ice Chips Ice chips: Not tested   Thin Liquid      Nectar Thick Nectar Thick Liquid: Not tested   Honey Thick Honey Thick Liquid: Not tested   Puree Puree: Within functional limits Presentation: Spoon;Self Fed   Solid   GO    Solid: Not tested (secondary to pt's issues with nausea and his dysarthria)      Luanna Salk, [place] [facility] [place] [phone]

## 2015-05-21 NOTE — Progress Notes (Signed)
Name: [name] MRN: [id number] DOB: 1955/11/18    ADMISSION DATE:  05/03/2015 CONSULTATION DATE:  05/13/15  REFERRING MD :  Dr. Doyle Askew   CHIEF COMPLAINT:  Fever of unknown origin, tachypnea/tachycardia  BRIEF PATIENT DESCRIPTION:  58 y/o M, homeless male (lives in a hotel) with PMH of HTN, NSTEMI, HLD, arthritis, anxiety / depression, former polysubstance abuse with cardiomyopathy (EF20%), CKD IV and medical non-compliance who presented to [facility] on 10/16 with complaints of neck/face swelling with hives.The patient has also had intermittent fever and rash.  Interestingly, WBC reduced when antibiotics were stopped.  Eosinophils rose to 4.1K on 10/25.  On 10/26, he developed tachycardia, tachypnea and fever to 101. on 10/26 he underwent a bone marrow biopsy.  He developed fever, tachypnea and tachycardia on 10/26 and was transferred to ICU.    SIGNIFICANT EVENTS  10/13  Seen in ER for N/V, sore throat, diarrhea & rash 10/16  Admit to [facility] for facial swelling, cough with frothy yellow sputum, sore throat and hives. 10/26 bone marrow bx done as part of evaluation for eosinophilia. Developed worsening distress later that afternoon. intubated 10/27 cvvh started 10/28-10/31: Bone marrow bx from 26th= ecoli, BCx2 showing Enterobacter. ID following. By 10/30 pressors weaning. Still on CRRT. 10/31 attempting weaning efforts.  10/31: BAL to eval to help explain: unexplained syndrome characterized by fever, hypereosinophilia with high IgE. Primaxin changed to Rocephin by ID 11/1: sedated. No spont effort during weaning. Fentanyl gtt stopped.  11/2 self extubated  STUDIES:  10/13  RUQ [place] - mild gallbladder wall thickening, no gallstones observed 10/16  CXR - no acute infiltrate 10/16  CT Soft tissue Neck - asymmetric enlargement of R submandibular gland relative to the left is non-specific but could be due to infection, inflammation or tumor.  Negative for abscess, diffuse subcutaneous edema 10/17   MRI Soft Tissue Neck - motion degraded study, no discrete mass, grossly similar subcutaneous edema involving the bilateral submandibular spaces (nonspecific but must consider infection/cellulitis), no discrete abscess  10/21  CXR - no acute infiltrate 10/21  CT Abd/Pelvis - diffuse body wall edema suggestive of anasarca, mesenteric edema but no overt ascites, no acute intra-abdominal abnormalities, stable prostate gland enlargement and small hiatal hernia 10/26 TTE - LV & RV moderately dilated. EF 25-30% w/ diffuse hypokinesis of LV. Moderate MR. No pericardial effusion. 10/26 Renal US - Medical renal disease. Right pleural effusion. No hydronephrosis. 10/27 CT chest, abd, pelvis - Bibasilar atelectasis/ early infiltrate, anasarca. 10/29 RUQ [place] - no stones. No perichole fluid. CBD 36m. No focal liver lesion. Right pleural effusion. 10/31 bronchoscopy > thick secretions, BAL sent  SUBJECTIVE: Awake and alert, no distress  VITAL SIGNS: Temp:  [95.7 F (35.4 C)-97.3 F (36.3 C)] 97.2 F (36.2 C) (11/03 0700) Pulse Rate:  [68-142] 90 (11/03 0700) Resp:  [8-22] 12 (11/03 0700) BP: (104-141)/(64-98) 104/65 mmHg (11/03 0700) SpO2:  [93 %-100 %] 99 % (11/03 0700) FiO2 (%):  [30 %] 30 % (11/02 0900) Weight:  [77.8 kg (171 lb 8.3 oz)] 77.8 kg (171 lb 8.3 oz) (11/03 0500)  Intake/Output Summary (Last 24 hours) at 05/21/15 0859 Last data filed at 05/21/15 0800  Gross per 24 hour  Intake 808.45 ml  Output   2177 ml  Net -1368.55 ml    PHYSICAL EXAMINATION: General: no distress HENT: NCAT OP Clear PULM: CTA B, non-labored breathing CV: RRR, no mgr GI: BS+, soft, nontender MSK: normal bulk and tone Derm: diffuse anasarca, scaling of skin (mild) hands  Neuro: Awake, confused, answers questions in appropriately, moves all four extremities well Psyche: calm   Recent Labs Lab 05/20/15 0515 05/20/15 1610 05/21/15 0420  NA 138 137 138  K 3.8 3.7 4.1  CL 107 102 105  CO2 '28 28 28  ' BUN  '19 17 17  ' CREATININE 1.25* 1.13 1.27*  GLUCOSE 134* 111* 96    Recent Labs Lab 05/19/15 0449 05/20/15 0515 05/21/15 0420  HGB 8.7* 8.3* 8.5*  HCT 26.3* 25.0* 25.2*  WBC 16.1* 15.5* 16.3*  PLT 127* 128* 126*   CBG (last 3)   Recent Labs  05/20/15 1931 05/21/15 0417 05/21/15 0728  GLUCAP 83 92 93      Recent Labs Lab 05/16/15 0415 05/18/15 0415 05/20/15 0515  ALT 422* 262* 178*  AST 572* 386* 222*  ALKPHOS 170* 186* 236*  BILITOT 9.4* 12.6* 10.4*     ASSESSMENT / PLAN:  PULMONARY A:  Acute Hypoxic Respiratory Failure - resolved Bronchoscopy with tracheobronchitis 10/31 P:   Incentive spirometry Sit up as able Monitor O2 saturation   CARDIOVASCULAR R IJ HD catheter 10/26 >>  Lt I J CVL 10/27>>10/31 A:   Septic Shock - resolved Moderate mitral regurgitation Dilated cardiomyopathy  H/O CAD H/O HTN  P:   Monitoring on Telemetry Aspirin 81 mg by mouth daily Plavix 75 mg by mouth daily Restart coreg 11/3 Consider restarting Bidil this week when BP normalizes Continue to hold ACE inhibitor given renal failure Wean off hydrocortisone  RENAL A:   Acute renal failure - remains oliguric On CVVHD  P:   Continuing CVVHD per nephrology Would prefer volume removal with CVVHD today, then transition to intermittent tomorrow if possible so we can be more aggressive with PT Monitor BMET and UOP Replace electrolytes as needed   GASTROINTESTINAL A:   Shock Liver - Improving, elevated alk phos with two RUQ U/S this admission without evidence of biliary disease Questionable EtOH Liver Disease  P:   Cont tube feeds Send GGT Pepcid IV daily > change to oral Advance diet, SLP eval  HEMATOLOGIC A:   Peripheral eosinophilia - and IgE unexplained (zosyn allergy?) not clear; still awaiting O&P BAL but the specimen was run too late; parasitosis would be a nice unifying diagnosis  Thrombocytopenia - stable  P:  Restart sub cutaneous heparin for DVT  prophylaxis Monitor for bleeding   INFECTIOUS A:   Septic Shock - resolved Enterobacter Bacteremia: TTE 10/26 without evidence of endocarditis E coli Osteomyelitis ? - from iliac bone marrow biopsy  E coli in sputum, has tracheobronchitis but doesn't really have HCAP as no infiltrate, improving HIV negative  Does he need a colonoscopy? Where did the enteric bactermia come from? Was this submandibular gland infection based on MRI and CT findings from October and September?   Vanco 10/26 - 10/27 Zosyn 10/26 - 10/26 Clinda x 1 10/17  Vanco 10/17 - 10/21  Zosyn 10/17 - 10/21  P:   Repeat Blood Cultures(10/29)>>> 10/31 BAL: rare GNR>>> E Coli  Appreciate ID assistance Primaxin 10/26 >>10/31 Rocephin 10/31>>>  Consider repeat CT imaging or MRI of submandibular gland  DERMATOLOGY:  A:  Rash w/ Dermal Peeling - Unclear etiology, stable   P: Solu-Cortef  25 mg q12 > wean off  ENDOCRINE A: Hypoglycemia - Improving   P:   Wean off hydrocortisone Advance diet Continuing Accuchecks q4hr  NEUROLOGIC A:  Some confusion 11/3, suspect related to ICU stay, sedating meds yesterday,   P:   No sedating meds  Frequent orientation  FAMILY  - Updates: brother updated by phone 10/31  - Inter-disciplinary family meet or Palliative Care meeting due by:  05/18/15   TODAY'S SUMMARY:  59 year old complex male with septic shock secondary enterobacter bacteremia of uncertain etiology.  Was this related to his submandibular swelling (had complained of this since 03/2015). Enterobacter is a known pathogen for this.  The eosinophilia remains unexplained, perhaps a reaction to zosyn.  Still awaiting O&P from sputum, but I think it will be negative.  Overall much better.  Will likely need intermittent HD.  Will try to normalize him today with food, working with PT.  Roselie Awkward, MD [facility] Pager: [phone] Cell: [phone] After 3pm or if no response, call  [phone]  05/21/2015'@9' :22 AM

## 2015-05-21 NOTE — Progress Notes (Signed)
Filter pressures rising this PM. Per Dr. Melvia Heaps heparin may be restarted upon filter change.  Blood from set returned to patient. Filter removed at 1750. New set begun at 1830.

## 2015-05-21 NOTE — Care Management Note (Signed)
Case Management Note  Patient Details  Name: [name] MRN: [id number] Date of Birth: 1956-06-24  Subjective/Objective:             aki with crrt required       Action/Plan: Date: May 21, 2015 Chart reviewed for concurrent status and case management needs. Will continue to follow patient for changes and needs: Velva Harman, RN, BSN, [place]   [phone]  Expected Discharge Date:                  Expected Discharge Plan:  Home/Self Care  In-House Referral:  Clinical Social Work  Discharge planning Services  CM Consult  Post Acute Care Choice:    Choice offered to:     DME Arranged:    DME Agency:     HH Arranged:    [place] Agency:     Status of Service:  In process, will continue to follow  Medicare Important Message Given:    Date Medicare IM Given:    Medicare IM give by:    Date Additional Medicare IM Given:    Additional Medicare Important Message give by:     If discussed at [place] of Stay Meetings, dates discussed:  [id number]  Additional Comments:  Leeroy Cha, RN 05/21/2015, 10:40 AM

## 2015-05-22 ENCOUNTER — Inpatient Hospital Stay (HOSPITAL_COMMUNITY): Payer: Medicaid Other

## 2015-05-22 DIAGNOSIS — L8990 Pressure ulcer of unspecified site, unspecified stage: Secondary | ICD-10-CM | POA: Insufficient documentation

## 2015-05-22 DIAGNOSIS — N179 Acute kidney failure, unspecified: Secondary | ICD-10-CM | POA: Insufficient documentation

## 2015-05-22 LAB — CBC WITH DIFFERENTIAL/PLATELET
BASOS PCT: 0 %
Basophils Absolute: 0 10*3/uL (ref 0.0–0.1)
EOS ABS: 0 10*3/uL (ref 0.0–0.7)
Eosinophils Relative: 0 %
HCT: 27.5 % — ABNORMAL LOW (ref 39.0–52.0)
Hemoglobin: 9 g/dL — ABNORMAL LOW (ref 13.0–17.0)
Lymphocytes Relative: 20 %
Lymphs Abs: 2.4 10*3/uL (ref 0.7–4.0)
MCH: 36.1 pg — AB (ref 26.0–34.0)
MCHC: 32.7 g/dL (ref 30.0–36.0)
MCV: 110.4 fL — AB (ref 78.0–100.0)
Monocytes Absolute: 1 10*3/uL (ref 0.1–1.0)
Monocytes Relative: 8 %
NEUTROS PCT: 72 %
Neutro Abs: 8.8 10*3/uL — ABNORMAL HIGH (ref 1.7–7.7)
PLATELETS: 121 10*3/uL — AB (ref 150–400)
RBC: 2.49 MIL/uL — ABNORMAL LOW (ref 4.22–5.81)
RDW: 23.5 % — AB (ref 11.5–15.5)
WBC: 12.2 10*3/uL — AB (ref 4.0–10.5)

## 2015-05-22 LAB — RENAL FUNCTION PANEL
ALBUMIN: 2.3 g/dL — AB (ref 3.5–5.0)
ANION GAP: 4 — AB (ref 5–15)
BUN: 17 mg/dL (ref 6–20)
CALCIUM: 8.6 mg/dL — AB (ref 8.9–10.3)
CO2: 29 mmol/L (ref 22–32)
Chloride: 104 mmol/L (ref 101–111)
Creatinine, Ser: 1.1 mg/dL (ref 0.61–1.24)
GLUCOSE: 107 mg/dL — AB (ref 65–99)
PHOSPHORUS: 2.9 mg/dL (ref 2.5–4.6)
Potassium: 4.6 mmol/L (ref 3.5–5.1)
SODIUM: 137 mmol/L (ref 135–145)

## 2015-05-22 LAB — POCT ACTIVATED CLOTTING TIME
ACTIVATED CLOTTING TIME: 190 s
ACTIVATED CLOTTING TIME: 202 s
Activated Clotting Time: 190 seconds
Activated Clotting Time: 196 seconds
Activated Clotting Time: 190 seconds
Activated Clotting Time: 196 seconds

## 2015-05-22 LAB — APTT
APTT: 153 s — AB (ref 24–37)
aPTT: >200 seconds (ref 24–37)

## 2015-05-22 LAB — GLUCOSE, CAPILLARY
GLUCOSE-CAPILLARY: 92 mg/dL (ref 65–99)
GLUCOSE-CAPILLARY: 96 mg/dL (ref 65–99)
GLUCOSE-CAPILLARY: 118 mg/dL — AB (ref 65–99)
Glucose-Capillary: 127 mg/dL — ABNORMAL HIGH (ref 65–99)
Glucose-Capillary: 96 mg/dL (ref 65–99)
Glucose-Capillary: 99 mg/dL (ref 65–99)

## 2015-05-22 LAB — MAGNESIUM: MAGNESIUM: 2.2 mg/dL (ref 1.7–2.4)

## 2015-05-22 MED ORDER — HYDROCERIN EX CREA
TOPICAL_CREAM | Freq: Three times a day (TID) | CUTANEOUS | Status: DC
  Administered 2015-05-22: 18:00:00 via TOPICAL
  Administered 2015-05-22: 1 via TOPICAL
  Administered 2015-05-23: 16:00:00 via TOPICAL
  Administered 2015-05-23: 1 via TOPICAL
  Administered 2015-05-24 (×2): via TOPICAL
  Administered 2015-05-25: 1 via TOPICAL
  Administered 2015-05-25 – 2015-05-27 (×7): via TOPICAL
  Administered 2015-05-28: 1 via TOPICAL
  Administered 2015-05-28 – 2015-05-29 (×3): via TOPICAL
  Filled 2015-05-22 (×3): qty 113

## 2015-05-22 MED ORDER — INSULIN ASPART 100 UNIT/ML ~~LOC~~ SOLN
0.0000 [IU] | Freq: Three times a day (TID) | SUBCUTANEOUS | Status: DC
  Administered 2015-05-26: 2 [IU] via SUBCUTANEOUS

## 2015-05-22 MED ORDER — HYDROCORTISONE 10 MG PO TABS
10.0000 mg | ORAL_TABLET | Freq: Two times a day (BID) | ORAL | Status: DC
  Administered 2015-05-22 – 2015-05-26 (×7): 10 mg via ORAL
  Filled 2015-05-22 (×12): qty 1

## 2015-05-22 MED ORDER — SODIUM CHLORIDE 0.9 % IJ SOLN
10.0000 mL | INTRAMUSCULAR | Status: DC | PRN
  Administered 2015-05-26 (×2): 10 mL
  Filled 2015-05-22 (×2): qty 40

## 2015-05-22 MED ORDER — PRO-STAT SUGAR FREE PO LIQD
30.0000 mL | Freq: Every morning | ORAL | Status: DC
  Administered 2015-05-23 – 2015-05-29 (×7): 30 mL via ORAL
  Filled 2015-05-22 (×8): qty 30

## 2015-05-22 MED ORDER — GLUCERNA SHAKE PO LIQD
237.0000 mL | Freq: Two times a day (BID) | ORAL | Status: DC
  Administered 2015-05-23 – 2015-05-28 (×10): 237 mL via ORAL
  Filled 2015-05-22 (×2): qty 237

## 2015-05-22 NOTE — Progress Notes (Addendum)
Patient began calling out, his words unintelligible. I entered room to assess patients needs. The patient was kicking the covers off of his legs and pointing to his SCD's while yelling out. I turned off the patients SCD's, but the patient continued to kick and attempt to get out of bed. The patient was encouraged to remain in bed and was reminded that he was on dialysis and pulling on the catheter could be dangerous. The patient was not redirectable and continued to pull on the HD catheter and attempt to get out of bed while also attempting to hit RN. The patient stated, "you cannot keep me here, get the chains off of my legs." At that time the SCD's were completely removed from the patients legs and it was explained to the patient that they were not restraining him. The patient remained agitated and combative, still attempting to get out of bed, with a heart rate increase into the 130's. After multiple failed attempts to redirect the patient, PRN fentanyl was given and the patient relaxed and was able to be redirected. The patient is resting comfortably. Will continue to monitor for new agitation or confusion. Luther Parody, RN

## 2015-05-22 NOTE — Procedures (Deleted)
Arterial Catheter Insertion Procedure Note [name] [id number] [birth year]  Procedure: Insertion of Arterial Catheter  Indications: Blood pressure monitoring and Frequent blood sampling  Procedure Details Consent: Risks of procedure as well as the alternatives and risks of each were explained to the (patient/caregiver).  Consent for procedure obtained. Time Out: Verified patient identification, verified procedure, site/side was marked, verified correct patient position, special equipment/implants available, medications/allergies/relevent history reviewed, required imaging and test results available.  Performed  Maximum sterile technique was used including cap, gloves, gown, hand hygiene, mask and sheet. Skin prep: Chlorhexidine; local anesthetic administered 20 gauge catheter was inserted into left radial artery using the Seldinger technique.  Evaluation Blood flow good; BP tracing good. Complications: No apparent complications.   [name] 05/22/2015

## 2015-05-22 NOTE — Progress Notes (Signed)
Speech Language Pathology Treatment: Dysphagia  Patient Details Name: [name] MRN: [id number] DOB: [birth year] Today's Date: 05/22/2015 Time: [id number] SLP Time Calculation (min) (ACUTE ONLY): 18 min  Assessment / Plan / Recommendation Clinical Impression  Pt denies having difficulties swallowing at this time - SLP  Questions if neck edema has decreased and aided resolution of symptoms per reported in mid-October.  Voice is strong today and pt has a strong cough.  RN reports pt with great tolerance of liquids.  Cough x1 with intake of approximately 10 boluses  - cracker, applesauce, thin water -  Adequate mastication with solids noted and NO indications of oropharyngeal residuals.  Wet voice x1 cleared with cued throat clearing - ? Secretions retained in pharynx/larynx.       Today, palatal elevation continues to appear to deviate to right upon phonation - but head turned left and can not move to midline due to line in neck.  No extrapyramidal movement noted orally today - as observed yesterday. Educated pt to aspiration precautions.  SLP to sign off as pt appears to be managing po well!     HPI Other Pertinent Information: 59 yo male adm to [facility] with neck edema.  Pt with h/o HAs, pna, NSTEMI, leukocytosis.  CT showed right submandibular gland asymmetrical enlargement during early admission.  CXR upon admit was negative.  Pt had difficulties with throat swelling in September as well as pt had xray completed at that time.  Pt reports difficulty swallowing foods and frequent expectoration of secretions.  He also pointed to midesophagus to indicate area of slow clearance during prior evaluation.  CT neck early in hospital coarse revealed  Diffuse subcutaneous edema - nonspecific and likely due to anasarca.  Pt had episode of respiratory deficits, nausea/vomiting and required intubation 10/26-11/2.   He self extubated 05/20/15.  Swallow evaluation ordered, RN reports pt with episodes of nausea today but  is tolerating clear liquids.     Pertinent Vitals Pain Assessment: No/denies pain  SLP Plan    n/a   Recommendations Diet recommendations: Regular;Thin liquid Liquids provided via: Cup;Straw Medication Administration: Whole meds with liquid Supervision: Staff to assist with self feeding (due to line in neck decreasing pt's movements) Compensations: Slow rate;Small sips/bites Postural Changes and/or Swallow Maneuvers: Seated upright 90 degrees;Upright 30-60 min after meal              Oral Care Recommendations: Oral care BID Follow up Recommendations: None    GO     Luanna Salk, [facility] SLP [phone]

## 2015-05-22 NOTE — Progress Notes (Signed)
Name: [name] MRN: [id number] DOB: 06-14-1956    ADMISSION DATE:  05/03/2015 CONSULTATION DATE:  05/13/15  REFERRING MD :  Dr. Doyle Askew   CHIEF COMPLAINT:  Fever of unknown origin, tachypnea/tachycardia  BRIEF PATIENT DESCRIPTION:  59 y/o M, homeless male (lives in a hotel) with PMH of HTN, NSTEMI, HLD, arthritis, anxiety / depression, former polysubstance abuse with cardiomyopathy (EF20%), CKD IV and medical non-compliance who presented to [facility] on 10/16 with complaints of neck/face swelling with hives.The patient has also had intermittent fever and rash.  Interestingly, WBC reduced when antibiotics were stopped.  Eosinophils rose to 4.1K on 10/25.  On 10/26, he developed tachycardia, tachypnea and fever to 101. on 10/26 he underwent a bone marrow biopsy.  He developed fever, tachypnea and tachycardia on 10/26 and was transferred to ICU.    SIGNIFICANT EVENTS  10/13  Seen in ER for N/V, sore throat, diarrhea & rash 10/16  Admit to [facility] for facial swelling, cough with frothy yellow sputum, sore throat and hives. 10/26 bone marrow bx done as part of evaluation for eosinophilia. Developed worsening distress later that afternoon. intubated 10/27 cvvh started 10/28-10/31: Bone marrow bx from 26th= ecoli, BCx2 showing Enterobacter. ID following. By 10/30 pressors weaning. Still on CRRT. 10/31 attempting weaning efforts.  10/31: BAL to eval to help explain: unexplained syndrome characterized by fever, hypereosinophilia with high IgE. Primaxin changed to Rocephin by ID 11/1: sedated. No spont effort during weaning. Fentanyl gtt stopped.  11/2 self extubated  STUDIES:  10/13  RUQ [place] - mild gallbladder wall thickening, no gallstones observed 10/16  CXR - no acute infiltrate 10/16  CT Soft tissue Neck - asymmetric enlargement of R submandibular gland relative to the left is non-specific but could be due to infection, inflammation or tumor.  Negative for abscess, diffuse subcutaneous edema 10/17   MRI Soft Tissue Neck - motion degraded study, no discrete mass, grossly similar subcutaneous edema involving the bilateral submandibular spaces (nonspecific but must consider infection/cellulitis), no discrete abscess  10/21  CXR - no acute infiltrate 10/21  CT Abd/Pelvis - diffuse body wall edema suggestive of anasarca, mesenteric edema but no overt ascites, no acute intra-abdominal abnormalities, stable prostate gland enlargement and small hiatal hernia 10/26 TTE - LV & RV moderately dilated. EF 25-30% w/ diffuse hypokinesis of LV. Moderate MR. No pericardial effusion. 10/26 Renal US - Medical renal disease. Right pleural effusion. No hydronephrosis. 10/27 CT chest, abd, pelvis - Bibasilar atelectasis/ early infiltrate, anasarca. 10/29 RUQ [place] - no stones. No perichole fluid. CBD 49m. No focal liver lesion. Right pleural effusion. 10/31 bronchoscopy > thick secretions, BAL sent  SUBJECTIVE: Awake and alert, no distress,   VITAL SIGNS: Temp:  [95.2 F (35.1 C)-98.1 F (36.7 C)] 96.8 F (36 C) (11/04 0800) Pulse Rate:  [45-139] 113 (11/04 0800) Resp:  [0-25] 23 (11/04 0800) BP: (110-150)/(77-121) 128/107 mmHg (11/04 0800) SpO2:  [96 %-100 %] 96 % (11/04 0800)  Room air   Intake/Output Summary (Last 24 hours) at 05/22/15 03329Last data filed at 05/22/15 0900  Gross per 24 hour  Intake 1506.5 ml  Output   3677 ml  Net -2170.5 ml    PHYSICAL EXAMINATION: General: no distress HENT: NCAT OP Clear, dressings from left IJ CD&I PULM: CTA B, non-labored breathing CV: RRR, no mgr GI: BS+, soft, nontender MSK: normal bulk and tone Derm: diffuse anasarca, scaling of skin (mild) hands Neuro: Awake, confused, answers questions in appropriately, moves all four extremities well Psyche: calm  Recent Labs Lab 05/21/15 0420 05/21/15 1611 05/22/15 0501  NA 138 137 137  K 4.1 4.3 4.6  CL 105 103 104  CO2 '28 28 29  ' BUN '17 17 17  ' CREATININE 1.27* 1.22 1.10  GLUCOSE 96 112* 107*     Recent Labs Lab 05/20/15 0515 05/21/15 0420 05/22/15 0501  HGB 8.3* 8.5* 9.0*  HCT 25.0* 25.2* 27.5*  WBC 15.5* 16.3* 12.2*  PLT 128* 126* 121*   CBG (last 3)   Recent Labs  05/21/15 2347 05/22/15 0353 05/22/15 0802  GLUCAP 99 92 96      Recent Labs Lab 05/16/15 0415 05/18/15 0415 05/20/15 0515  ALT 422* 262* 178*  AST 572* 386* 222*  ALKPHOS 170* 186* 236*  BILITOT 9.4* 12.6* 10.4*     ASSESSMENT / PLAN:  PULMONARY A:  Acute Hypoxic Respiratory Failure - resolved Bronchoscopy with tracheobronchitis 10/31-->e-coli  P:   Incentive spirometry Sit up as able Monitor O2 saturation OOB   CARDIOVASCULAR R IJ HD catheter 10/26 >>  Lt I J CVL 10/27>>10/31 A:   Septic Shock - resolved Moderate mitral regurgitation Dilated cardiomyopathy  H/O CAD H/O HTN  P:   Monitoring on Telemetry Aspirin 81 mg by mouth daily Plavix 75 mg by mouth daily Restarted coreg 11/3 Consider restarting Bidil this week when BP normalizes Continue to hold ACE inhibitor given renal failure Wean off hydrocortisone  RENAL A:   Acute renal failure - remains oliguric On CVVHD  P:   Continuing CVVHD per nephrology--->think that we can transition him to intermittent. If so will transfer to [facility]  Monitor BMET and UOP Replace electrolytes as needed   GASTROINTESTINAL A:   Shock Liver - Improving, elevated alk phos with two RUQ U/S this admission without evidence of biliary disease Questionable EtOH Liver Disease  P:   Cont tube feeds Send GGT Pepcid IV daily > change to oral Advance diet, SLP eval  HEMATOLOGIC A:   Peripheral eosinophilia - and IgE unexplained (zosyn allergy?) not clear; still awaiting O&P BAL but the specimen was run too late; parasitosis would be a nice unifying diagnosis Thrombocytopenia - stable  P:  Restart sub cutaneous heparin for DVT prophylaxis Monitor for bleeding   INFECTIOUS A:   Septic Shock - resolved Enterobacter  Bacteremia: TTE 10/26 without evidence of endocarditis E coli Osteomyelitis ? - from iliac bone marrow biopsy  E coli in sputum, has tracheobronchitis but doesn't really have HCAP as no infiltrate, improving HIV negative  Does he need a colonoscopy? Where did the enteric bactermia come from? Was this submandibular gland infection based on MRI and CT findings from October and September?   Vanco 10/26 - 10/27 Zosyn 10/26 - 10/26 Clinda x 1 10/17  Vanco 10/17 - 10/21  Zosyn 10/17 - 10/21  P:   Repeat Blood Cultures(10/29)>>> 10/31 BAL: rare GNR>>> E Coli  Appreciate ID assistance Primaxin 10/26 >>10/31 Rocephin 10/31>>> We will Consider repeat CT imaging or MRI of submandibular gland  DERMATOLOGY:  A:  Rash w/ Dermal Peeling - Unclear etiology, stable   P: Solu-Cortef  25 mg q12 > wean off  ENDOCRINE A: Hypoglycemia - Improving   P:   Wean off hydrocortisone Advance diet Continuing Accuchecks q4hr  NEUROLOGIC A:  Some confusion/encephalopathy  11/3, improving Physical deconditioning P:   No sedating meds Frequent orientation Start PT/OT   FAMILY  - Updates: brother updated by phone 10/31  - Inter-disciplinary family meet or Palliative Care meeting due by:  05/18/15   TODAY'S SUMMARY:  59 year old complex male with septic shock secondary enterobacter bacteremia of uncertain etiology.  Was this related to his submandibular swelling (had complained of this since 03/2015). Enterobacter is a known pathogen for this.  The eosinophilia remains unexplained, perhaps a reaction to zosyn.  Still awaiting O&P from sputum, but think it will be negative.  Overall much better.  Will likely need intermittent HD.  Get him OOB. Start PT. Think that the challenging factor for the future will be to see if his kidneys improve. Doubt he is a good long term HD candidate being homeless. If off CRRT will transfer to [facility] SDU and ask IM service to assume care. At that point he can continue  intermittent HD  Erick Colace ACNP-BC [place] Pager # [phone] OR # [phone] if no answer  05/22/2015'@9' :07 AM

## 2015-05-22 NOTE — Progress Notes (Addendum)
[place] [facility]    Subjective:  No complaints, "thank you"  Antibiotics:  Anti-infectives    Start     Dose/Rate Route Frequency Ordered Stop   05/18/15 1300  cefTRIAXone (ROCEPHIN) 2 g in dextrose 5 % 50 mL IVPB     2 g 100 mL/hr over 30 Minutes Intravenous Every 24 hours 05/18/15 1143     05/15/15 1600  vancomycin (VANCOCIN) IVPB 1000 mg/200 mL premix  Status:  Discontinued     1,000 mg 200 mL/hr over 60 Minutes Intravenous Every 48 hours 05/13/15 1507 05/13/15 2028   05/14/15 2200  vancomycin (VANCOCIN) IVPB 1000 mg/200 mL premix  Status:  Discontinued     1,000 mg 200 mL/hr over 60 Minutes Intravenous Every 24 hours 05/14/15 0633 05/14/15 0640   05/14/15 1200  piperacillin-tazobactam (ZOSYN) IVPB 2.25 g  Status:  Discontinued     2.25 g 100 mL/hr over 30 Minutes Intravenous 4 times per day 05/14/15 0910 05/14/15 1008   05/14/15 1200  piperacillin-tazobactam (ZOSYN) IVPB 3.375 g  Status:  Discontinued     3.375 g 100 mL/hr over 30 Minutes Intravenous 4 times per day 05/14/15 1008 05/14/15 1028   05/14/15 1200  imipenem-cilastatin (PRIMAXIN) 500 mg in sodium chloride 0.9 % 100 mL IVPB  Status:  Discontinued     500 mg 200 mL/hr over 30 Minutes Intravenous 4 times per day 05/14/15 1028 05/18/15 1127   05/13/15 2200  piperacillin-tazobactam (ZOSYN) IVPB 3.375 g  Status:  Discontinued     3.375 g 12.5 mL/hr over 240 Minutes Intravenous 3 times per day 05/13/15 1507 05/14/15 0910   05/13/15 2200  vancomycin (VANCOCIN) IVPB 1000 mg/200 mL premix  Status:  Discontinued     1,000 mg 200 mL/hr over 60 Minutes Intravenous Every 48 hours 05/13/15 2028 05/14/15 0631   05/13/15 1430  piperacillin-tazobactam (ZOSYN) IVPB 3.375 g  Status:  Discontinued     3.375 g 100 mL/hr over 30 Minutes Intravenous  Once 05/13/15 1428 05/13/15 2028   05/13/15 1430  vancomycin (VANCOCIN) IVPB 1000 mg/200 mL premix  Status:  Discontinued     1,000 mg 200 mL/hr  over 60 Minutes Intravenous  Once 05/13/15 1428 05/13/15 2028   05/07/15 0700  vancomycin (VANCOCIN) IVPB 1000 mg/200 mL premix  Status:  Discontinued     1,000 mg 200 mL/hr over 60 Minutes Intravenous Every 24 hours 05/07/15 0611 05/08/15 1438   05/04/15 1100  piperacillin-tazobactam (ZOSYN) IVPB 3.375 g  Status:  Discontinued     3.375 g 12.5 mL/hr over 240 Minutes Intravenous 3 times per day 05/04/15 1003 05/08/15 1438   05/04/15 0600  vancomycin (VANCOCIN) IVPB 750 mg/150 ml premix  Status:  Discontinued     750 mg 150 mL/hr over 60 Minutes Intravenous Every 24 hours 05/04/15 0557 05/07/15 0610   05/03/15 2200  clindamycin (CLEOCIN) IVPB 600 mg  Status:  Discontinued     600 mg 100 mL/hr over 30 Minutes Intravenous 3 times per day 05/03/15 1524 05/04/15 0225   05/03/15 1330  clindamycin (CLEOCIN) IVPB 600 mg     600 mg 100 mL/hr over 30 Minutes Intravenous  Once 05/03/15 1326 05/03/15 1457      Medications: Scheduled Meds: . antiseptic oral rinse  7 mL Mouth Rinse QID  . aspirin  81 mg Oral Daily  . carvedilol  3.125 mg Oral BID WC  . cefTRIAXone (ROCEPHIN)  IV  2 g Intravenous Q24H  . clopidogrel  75 mg Oral Daily  . famotidine  20 mg Oral Daily  . feeding supplement (GLUCERNA SHAKE)  237 mL Oral BID BM  . feeding supplement (PRO-STAT SUGAR FREE 64)  30 mL Oral q morning - 10a  . heparin subcutaneous  5,000 Units Subcutaneous 3 times per day  . hydrocerin   Topical TID  . hydrocortisone  10 mg Oral BID  . insulin aspart  0-9 Units Subcutaneous 6 times per day  . sodium chloride  3 mL Intravenous Q12H   Continuous Infusions: . heparin 10,000 units/ 20 mL infusion syringe 850 Units/hr (05/22/15 1000)   PRN Meds:.acetaminophen **OR** [DISCONTINUED] acetaminophen, heparin, heparin, heparin, ondansetron **OR** ondansetron (ZOFRAN) IV, technetium TC 67M mebrofenin    Objective: Weight change:   Intake/Output Summary (Last 24 hours) at 05/22/15 1150 Last data filed at  05/22/15 1100  Gross per 24 hour  Intake 1581.5 ml  Output   4401 ml  Net -2819.5 ml   Blood pressure 143/109, pulse 113, temperature 97.2 F (36.2 C), temperature source Core (Comment), resp. rate 29, height '5\' 8"'  (1.727 m), weight 171 lb 8.3 oz (77.8 kg), SpO2 96 %. Temp:  [95.2 F (35.1 C)-98.1 F (36.7 C)] 97.2 F (36.2 C) (11/04 1000) Pulse Rate:  [63-139] 113 (11/04 0800) Resp:  [0-29] 29 (11/04 1000) BP: (110-143)/(77-109) 143/109 mmHg (11/04 1000) SpO2:  [96 %-100 %] 96 % (11/04 0800)  Physical Exam: General: extubated alert oriented to person, recognized family HEENT: icteric sclera,  EOMI CVS tachycardic, regular rate, normal r,  no murmur rubs or gallops Chest:  rhonchi Abdomen: soft  nondistended, normal bowel sounds, Extremities: 2+ edema Skin: rash he had previously has improved with bathing int he ICU it seems, still present on feet there are some new areas that he has peeled off Neuro: nonfocal  CBC: CBC Latest Ref Rng 05/22/2015 05/21/2015 05/20/2015  WBC 4.0 - 10.5 K/uL 12.2(H) 16.3(H) 15.5(H)  Hemoglobin 13.0 - 17.0 g/dL 9.0(L) 8.5(L) 8.3(L)  Hematocrit 39.0 - 52.0 % 27.5(L) 25.2(L) 25.0(L)  Platelets 150 - 400 K/uL 121(L) 126(L) 128(L)       BMET  Recent Labs  05/21/15 1611 05/22/15 0501  NA 137 137  K 4.3 4.6  CL 103 104  CO2 28 29  GLUCOSE 112* 107*  BUN 17 17  CREATININE 1.22 1.10  CALCIUM 8.6* 8.6*     Liver Panel   Recent Labs  05/20/15 0515  05/21/15 1611 05/22/15 0501  PROT 5.7*  --   --   --   ALBUMIN 1.8*  1.9*  < > 2.3* 2.3*  AST 222*  --   --   --   ALT 178*  --   --   --   ALKPHOS 236*  --   --   --   BILITOT 10.4*  --   --   --   BILIDIR 6.5*  --   --   --   IBILI 3.9*  --   --   --   < > = values in this interval not displayed.     Sedimentation Rate No results for input(s): ESRSEDRATE in the last 72 hours. C-Reactive Protein No results for input(s): CRP in the last 72 hours.  Micro Results: Recent  Results (from the past 720 hour(s))  Culture, blood (routine x 2)     Status: None   Collection Time: 05/04/15  2:50 AM  Result Value Ref Range Status  Specimen Description BLOOD RIGHT ARM  Final   Special Requests BOTTLES DRAWN AEROBIC AND ANAEROBIC [place]  Final   Culture   Final    NO GROWTH 5 DAYS Performed at [facility]    Report Status 05/09/2015 FINAL  Final  Culture, blood (routine x 2)     Status: None   Collection Time: 05/04/15  2:55 AM  Result Value Ref Range Status   Specimen Description BLOOD RIGHT HAND  Final   Special Requests BOTTLES DRAWN AEROBIC ONLY 10CC  Final   Culture   Final    NO GROWTH 5 DAYS Performed at [facility]    Report Status 05/09/2015 FINAL  Final  Culture, Urine     Status: None   Collection Time: 05/04/15 10:57 PM  Result Value Ref Range Status   Specimen Description URINE, RANDOM  Final   Special Requests NONE  Final   Culture   Final    NO GROWTH 1 DAY Performed at [facility]    Report Status 05/06/2015 FINAL  Final  Culture, Urine     Status: None   Collection Time: 05/11/15  4:21 PM  Result Value Ref Range Status   Specimen Description URINE, CLEAN CATCH  Final   Special Requests Normal  Final   Culture   Final    NO GROWTH 2 DAYS Performed at [facility]    Report Status 05/13/2015 FINAL  Final  Tissue culture     Status: None   Collection Time: 05/12/15  9:50 AM  Result Value Ref Range Status   Specimen Description BIOPSY BONE MARROW  Final   Special Requests NONE  Final   Gram Stain   Final    MODERATE WBC PRESENT,BOTH PMN AND MONONUCLEAR NO SQUAMOUS EPITHELIAL CELLS SEEN NO ORGANISMS SEEN Performed at [facility]    Culture   Final    FEW ESCHERICHIA COLI Performed at [facility]    Report Status 05/14/2015 FINAL  Final   Organism ID, Bacteria ESCHERICHIA COLI  Final      Susceptibility   Escherichia coli - MIC*    AMPICILLIN >=32 RESISTANT Resistant      AMPICILLIN/SULBACTAM >=32 RESISTANT Resistant     CEFAZOLIN >=64 RESISTANT Resistant     CEFEPIME <=1 SENSITIVE Sensitive     CEFTAZIDIME <=1 SENSITIVE Sensitive     CEFTRIAXONE <=1 SENSITIVE Sensitive     CIPROFLOXACIN <=0.25 SENSITIVE Sensitive     GENTAMICIN <=1 SENSITIVE Sensitive     IMIPENEM <=0.25 SENSITIVE Sensitive     PIP/TAZO 8 SENSITIVE Sensitive     TOBRAMYCIN <=1 SENSITIVE Sensitive     TRIMETH/SULFA Value in next row Sensitive      <=20 SENSITIVE(NOTE)    * FEW ESCHERICHIA COLI  Fungus culture w smear     Status: None (Preliminary result)   Collection Time: 05/12/15  9:50 AM  Result Value Ref Range Status   Specimen Description BIOPSY BONE MARROW  Final   Special Requests NONE  Final   Fungal Smear   Final    NO YEAST OR FUNGAL ELEMENTS SEEN Performed at [facility]    Culture   Final    CULTURE IN PROGRESS FOR FOUR WEEKS Performed at [facility]    Report Status PENDING  Incomplete  AFB culture with smear     Status: None (Preliminary result)   Collection Time: 05/12/15  9:50 AM  Result Value Ref Range Status   Specimen  Description BIOPSY BONE MARROW  Final   Special Requests NONE  Final   Acid Fast Smear   Final    NO ACID FAST BACILLI SEEN Performed at [facility]    Culture   Final    CULTURE WILL BE EXAMINED FOR 6 WEEKS BEFORE ISSUING A FINAL REPORT Performed at [facility]    Report Status PENDING  Incomplete  MRSA PCR Screening     Status: None   Collection Time: 05/13/15  2:59 PM  Result Value Ref Range Status   MRSA by PCR NEGATIVE NEGATIVE Final    Comment:        The GeneXpert MRSA Assay (FDA approved for NASAL specimens only), is one component of a comprehensive MRSA colonization surveillance program. It is not intended to diagnose MRSA infection nor to guide or monitor treatment for MRSA infections.   Culture, blood (x 2)     Status: None   Collection Time: 05/13/15  4:05 PM  Result Value Ref  Range Status   Specimen Description BLOOD RIGHT ARM  Final   Special Requests BOTTLES DRAWN AEROBIC AND ANAEROBIC  5CC  Final   Culture  Setup Time   Final    GRAM NEGATIVE RODS AEROBIC BOTTLE ONLY CRITICAL RESULT CALLED TO, READ BACK BY AND VERIFIED WITH: L FREI,RN AT 1884 05/14/15 BY L BENFIELD    Culture   Final    ENTEROBACTER CLOACAE Performed at [facility]    Report Status 05/16/2015 FINAL  Final   Organism ID, Bacteria ENTEROBACTER CLOACAE  Final      Susceptibility   Enterobacter cloacae - MIC*    CEFAZOLIN >=64 RESISTANT Resistant     CEFEPIME <=1 SENSITIVE Sensitive     CEFTAZIDIME <=1 SENSITIVE Sensitive     CEFTRIAXONE <=1 SENSITIVE Sensitive     CIPROFLOXACIN <=0.25 SENSITIVE Sensitive     GENTAMICIN <=1 SENSITIVE Sensitive     IMIPENEM 1 SENSITIVE Sensitive     TRIMETH/SULFA <=20 SENSITIVE Sensitive     PIP/TAZO <=4 SENSITIVE Sensitive     * ENTEROBACTER CLOACAE  Culture, blood (x 2)     Status: None   Collection Time: 05/13/15  4:24 PM  Result Value Ref Range Status   Specimen Description BLOOD RIGHT ARM  Final   Special Requests BOTTLES DRAWN AEROBIC AND ANAEROBIC  5CC  Final   Culture  Setup Time   Final    GRAM NEGATIVE RODS IN BOTH AEROBIC AND ANAEROBIC BOTTLES CRITICAL RESULT CALLED TO, READ BACK BY AND VERIFIED WITH: S TROTS '@0600'  05/14/15 MKELLY    Culture   Final    ENTEROBACTER CLOACAE SUSCEPTIBILITIES PERFORMED ON PREVIOUS CULTURE WITHIN THE LAST 5 DAYS. Performed at [facility]    Report Status 05/16/2015 FINAL  Final  Ova and parasite examination     Status: None   Collection Time: 05/14/15  4:25 PM  Result Value Ref Range Status   Specimen Description STOOL  Final   Special Requests NONE  Final   Ova and parasites   Final    NO OVA OR PARASITES SEEN Performed at [facility]    Report Status 05/15/2015 FINAL  Final  Stool culture     Status: None   Collection Time: 05/14/15  4:25 PM  Result Value Ref Range  Status   Specimen Description STOOL  Final   Special Requests NONE  Final   Culture   Final    NO SALMONELLA, SHIGELLA, CAMPYLOBACTER, YERSINIA, OR  E.COLI [id number]:H7 ISOLATED Performed at [facility]    Report Status 05/18/2015 FINAL  Final  Ova and parasite examination     Status: None   Collection Time: 05/18/15 12:30 PM  Result Value Ref Range Status   Specimen Description BRONCHIAL ALVEOLAR LAVAGE  Final   Special Requests Normal  Final   Ova and parasites   Final    NO OVA OR PARASITES SEEN Performed at [facility]    Report Status 05/21/2015 FINAL  Final  Culture, respiratory (NON-Expectorated)     Status: None   Collection Time: 05/18/15 12:30 PM  Result Value Ref Range Status   Specimen Description BRONCHIAL ALVEOLAR LAVAGE  Final   Special Requests Normal  Final   Gram Stain   Final    ABUNDANT WBC PRESENT, PREDOMINANTLY PMN NO SQUAMOUS EPITHELIAL CELLS SEEN NO ORGANISMS SEEN Performed at [facility]    Culture   Final    RARE ESCHERICHIA COLI Performed at [facility]    Report Status 05/21/2015 FINAL  Final   Organism ID, Bacteria ESCHERICHIA COLI  Final      Susceptibility   Escherichia coli - MIC*    AMPICILLIN >=32 RESISTANT Resistant     AMPICILLIN/SULBACTAM >=32 RESISTANT Resistant     CEFEPIME <=1 SENSITIVE Sensitive     CEFTAZIDIME <=1 SENSITIVE Sensitive     CEFTRIAXONE <=1 SENSITIVE Sensitive     CIPROFLOXACIN 0.5 SENSITIVE Sensitive     GENTAMICIN >=16 RESISTANT Resistant     IMIPENEM <=0.25 SENSITIVE Sensitive     PIP/TAZO <=4 SENSITIVE Sensitive     TOBRAMYCIN 8 INTERMEDIATE Intermediate     TRIMETH/SULFA Value in next row Sensitive      <=20 SENSITIVE(NOTE)    * RARE ESCHERICHIA COLI  Fungus Culture with Smear     Status: None (Preliminary result)   Collection Time: 05/18/15 12:30 PM  Result Value Ref Range Status   Specimen Description BRONCHIAL ALVEOLAR LAVAGE  Final   Special Requests Normal  Final    Fungal Smear   Final    NO YEAST OR FUNGAL ELEMENTS SEEN Performed at [facility]    Culture   Final    CULTURE IN PROGRESS FOR FOUR WEEKS Performed at [facility]    Report Status PENDING  Incomplete  AFB culture with smear     Status: None (Preliminary result)   Collection Time: 05/18/15 12:30 PM  Result Value Ref Range Status   Specimen Description BRONCHIAL ALVEOLAR LAVAGE  Final   Special Requests Normal  Final   Acid Fast Smear   Final    NO ACID FAST BACILLI SEEN Performed at [facility]    Culture   Final    CULTURE WILL BE EXAMINED FOR 6 WEEKS BEFORE ISSUING A FINAL REPORT Performed at [facility]    Report Status PENDING  Incomplete    Studies/Results: Dg Chest Port 1 View  05/22/2015  CLINICAL DATA:  Septic shock. EXAM: PORTABLE CHEST 1 VIEW COMPARISON:  05/16/2015 FINDINGS: Right jugular temporary dialysis catheter remains in place. Left jugular central line is been removed. Nasogastric tube has been removed. There is no evidence of pulmonary edema, consolidation, pneumothorax, nodule or pleural fluid. The heart size is stable and at the upper limits of normal. IMPRESSION: No active disease. Electronically Signed   By: Aletta Edouard M.D.   On: 05/22/2015 07:18      Assessment/Plan:  INTERVAL HISTORY:   05/18/15: pt sp bronchoscopy  but BAL for O&P lost 05/20/15: pt self extubated and now off pressors 05/21/15: BAL grew sensitive E coli 05/22/15: likely to come off CVVHD  Principal Problem:   Sepsis due to cellulitis [facility]) Active Problems:   Homelessness   Neck swelling   Acute renal failure superimposed on stage 3 chronic kidney disease (HCC)   Chronic combined systolic (congestive) and diastolic (congestive) heart failure (HCC)   Benign essential HTN   History of non-ST elevation myocardial infarction (NSTEMI)   Hyponatremia   Abnormal LFTs   Polysubstance abuse   Noncompliance   Leukocytosis   Cellulitis of neck    Dysphagia   Elevated LFTs   Submandibular gland infection   Urticaria   Rash and nonspecific skin eruption   Dry mouth   Dyspnea   FUO (fever of unknown origin)   Acute respiratory failure (HCC)   Renal failure (ARF), acute on chronic ([place])   Sepsis ([place])   Endotracheal tube present   Transaminitis   Septic shock ([place])   Multiorgan failure   Gram-negative bacteremia ([place])   Infection caused by Enterobacter cloacae   E coli infection   Eosinophilia   Hyperbilirubinemia    [name] is a 59 y.o. male with  Enterobacter bacteremia and septic shock with multi-organ failure, has been on pressors, CVVDH, with  previous E coli on bone marrow biopsy which also showed eosinophils as did his peripheral blood.  #1 Septic shock due Gram negative Enterobacter sepsis: Improving off pressors, extubated still on CVVVHD. Dr. Lake Bells shared with me literature on pt with cirrhosis submandibular gland infection that also developed enterobacter bacteremia. Perhaps that could have happened here as well? We can get another look at his submandibular gland once he is liberated from CVVHD  --continue rocephin 2 grams daily  #2 Bronchial secretions on BAL: sensitive E coli and covered  #3 E coli on bone marrow biopsy: not clear how this came about, could be a contaminant but is on therapy. I dont think he has osteo given now pain at site  #4 Eosinophilia: still a mystery, O&P negative, strongyloides antibody negative in serum and now eosinophilia resolved but on steroids. I would wean off the steroids  Perhaps when he is "out of the woods form his septic shock, need for CVVHD we can look for an colonosocopy.   I am not sure whether the E coli in t bone marrow was real vs contaminant  but we will be covering  him   #5 Hyperbilirubinemia: due to shock liver on chronic liver disease  #6 Renal failure: on CVVHD  Dr. Megan Salon is available this weekend for questions.    LOS: 18 days   Alcide Evener 05/22/2015, 11:50 AM

## 2015-05-22 NOTE — Progress Notes (Signed)
PT Cancellation Note  Patient Details Name: [name] MRN: [id number] DOB: Jan 16, 1956   Cancelled Treatment:    Reason Eval/Treat Not Completed: Pt transferred to [facility]. Will check back another day. Thanks.   Weston Anna, MPT Pager: [phone]

## 2015-05-22 NOTE — Progress Notes (Signed)
[facility] Progress Note   Subjective: UOP 267 cc yesterday.  I/O net negative 2.5L for the day  Filed Vitals:   05/18/15 1000 05/18/15 1015 05/18/15 1030 05/18/15 1141  BP: 89/56  84/47 107/67  Pulse: 60 55 57 63  Temp: 96.6 F (35.9 C) 96.4 F (35.8 C) 96.4 F (35.8 C)   TempSrc:      Resp: 16 14 14 14   Height:      Weight:      SpO2: 100% 99% 99% 100%   Exam: Awake, weak, responsive No jvd Chest clear bilat ant and lat RRR no MRG Abd soft dec'd BS no gross ascites GU foley brown urine in small amts 2+ diffuse edema of the LE's and UE's Neuro nonfocal, weak, tired, responsive  UA 10/26 - 100 prot, 3-6 wbc, 7-10 rbc UNa 19 UCr 236 Urine M-spike is negative CXR 10/29 no active disease  Summary: 37 AAM w hx CKD III, HTN, dilated CM (ischemic), polysubstance abuse and homelessness admitted with recent ED visit 10.13 for n/v/, sore throat, diarrhea and rash rx'd as allergic rxn. Admitted then on 10.16 for neck/ facial swelling, cough, sore throat and hives. Eosinophilia on admission and ^LFT's of unclear etiology. Had BM bx on 10/25. BM cx's grew ECOli and blood cx's ended up growing enterobacter cloacae.    Assessment: 1 AKI due to sepsis/ shock / ATN. No sign of recovery. Shock resolved, able to dc CRRT now and do intermittent HD at [facility].  Have d/w primary.   2 CKD stage III, baseline creat 1.4- 1.8 3 ID on abx for GNR in blood and bone marrow cx's- on Rocephin 4 Shock - improving 5 ^LFT's supportive care 6 Hx dilated CM EF 25%  Plan - dc CRRT.  Regular HD tomorrow at [facility]. Transfer pt to [facility].   Kelly Splinter MD [facility] pager [phone] cell [phone] 05/19/2015, 2:42 PM    Last Labs      Recent Labs Lab 05/17/15 0500 05/17/15 1600 05/18/15 0415  NA 136 137 137  K 4.4 4.0 3.7  CL 105 105 104  CO2 27 28 27   GLUCOSE 145* 112* 130*  BUN 19 14  20   CREATININE 1.47* 1.05 1.31*  CALCIUM 7.0* 7.3* 7.6*  PHOS 3.3 2.7 2.9      Last Labs      Recent Labs Lab 05/15/15 0400  05/16/15 0415  05/17/15 0500 05/17/15 1600 05/18/15 0415  AST 981* --  572* --  --  --  386*  ALT 621* --  422* --  --  --  262*  ALKPHOS 211* --  170* --  --  --  186*  BILITOT 9.0* --  9.4* --  --  --  12.6*  PROT 5.7* --  5.2* --  --  --  5.6*  ALBUMIN 2.0*  2.1* < > 1.8* < > 1.8* 1.8* 1.8*  1.8*  < > = values in this interval not displayed.    Last Labs      Recent Labs Lab 05/16/15 0415 05/17/15 0500 05/18/15 0415  WBC 20.4* 20.2* 20.5*  NEUTROABS 17.0* 14.6* 13.4*  HGB 10.6* 9.4* 9.0*  HCT 30.7* 27.6* 25.7*  MCV 107.0* 103.8* 108.0*  PLT 152 146* 146*     . antiseptic oral rinse 7 mL Mouth Rinse QID  . aspirin 81 mg Oral Daily  . cefTRIAXone (ROCEPHIN) IV 2 g Intravenous Q24H  . chlorhexidine gluconate 15 mL Mouth Rinse BID  .  clopidogrel 75 mg Oral Daily  . diphenhydrAMINE 25 mg Per Tube 4 times per day  . famotidine 20 mg Oral Daily  . feeding supplement (NEPRO CARB STEADY) 1,000 mL Per Tube Q24H  . fentaNYL (SUBLIMAZE) injection 50 mcg Intravenous Once  . hydrocortisone sodium succinate 100 mg Intravenous Q12H  . insulin aspart 0-9 Units Subcutaneous 6 times per day  . mirabegron ER 25 mg Oral Daily  . sodium chloride 3 mL Intravenous Q12H   . dextrose 5 % and 0.45% NaCl 25 mL/hr at 05/18/15 1300  . fentaNYL infusion INTRAVENOUS 100 mcg/hr (05/18/15 1300)  . heparin 10,000 units/ 20 mL infusion syringe 700 Units/hr (05/18/15 1300)  . norepinephrine (LEVOPHED) Adult infusion 4.053 mcg/min (05/18/15 1300)  . dialysis replacement fluid (prismasate) 200 mL/hr at 05/17/15 1220  . dialysis replacement fluid (prismasate)  400 mL/hr at 05/18/15 0149  . dialysate (PRISMASATE) 1,500 mL/hr at 05/18/15 1012   [CANCELED] Place/Maintain arterial line **AND** sodium chloride, acetaminophen **OR** acetaminophen, fentaNYL, heparin, heparin, heparin, LORazepam, ondansetron **OR** ondansetron (ZOFRAN) IV, sodium chloride, technetium TC 29M mebrofenin

## 2015-05-22 NOTE — Progress Notes (Signed)
Admission note:   Arrival Method: Via [facility] from [employer]. Mental Status: A&O to person, place. Telemetry: Placed on box #23.  Skin: Patient's hands/feets are very actively peeling in large areas. Healed skin tear/abrasion on right AC. Left hand (below thumb) with skin tear - pt states he had peeled the skin off. Coccyx with st 2 pressure ulcer/laceration noted - foam in place.  Tubes: Foley catheter. IV: No PIV access. Patient has a RIJ HD catheter with pigtail present. IV team notified of access. Pain: Denies.  Family: Brother and sister in law at bedside. Living Situation: Possibly homeless? Safety Measures: Yellow socks, yellow bracelets, bed alarm on middle setting, call bell within reach. 6E Orientation: Oriented to unit and surroundings.  Joellen Jersey, RN.

## 2015-05-22 NOTE — Progress Notes (Signed)
CRITICAL VALUE ALERT  Critical value received:  PTT >200  Date of notification:  05/22/2015  Time of notification:  0616  Critical value read back:Yes.    Nurse who received alert:  S. Fabrizio Filip  MD notified (1st page):  Alfred Levins)  Time of first page:  [phone]  MD notified (2nd page):  Time of second page:  Responding MD:  Ashok Cordia  Time MD responded:  [phone]  Redraw PTT from peripheral stick and run results per Dr. Ashok Cordia.

## 2015-05-22 NOTE — Plan of Care (Signed)
Problem: Phase II Progression Outcomes Goal: Date pt extubated/weaned off vent Outcome: Completed/Met Date Met:  05/22/15 Self extubated on 2nd

## 2015-05-22 NOTE — Progress Notes (Signed)
[facility] Progress Note Patient Name: [name] DOB: 07-16-1956 MRN: [id number]   Date of Service  05/22/2015  HPI/Events of Note  APTT elevated. Sample drawn off HD catheter.  eICU Interventions  Lab to draw peripheral blood for PTT.     Intervention Category Major Interventions: Other:  Tera Partridge 05/22/2015, 6:24 AM

## 2015-05-22 NOTE — Progress Notes (Signed)
Nutrition Follow-up  DOCUMENTATION CODES:   Not applicable  INTERVENTION:  - Will order Glucerna Shake BID, each supplement provides 220 kcal and 10 grams of protein - Will order Prostat once/day, this supplement provides 100 kcal and 15 grams of protein - RD will continue to monitor for needs  NUTRITION DIAGNOSIS:   Inadequate oral intake related to inability to eat as evidenced by NPO status. -resolving with diet advancement and good intakes while on CLD  GOAL:   Patient will meet greater than or equal to 90% of their needs -unmet currently  MONITOR:   PO intake, Supplement acceptance, Weight trends, Labs, Skin, I & O's  ASSESSMENT:   59 year old male with past medical history of hypertension, chronic combined CHF, CAD, NSTEMI (on aspirin and plavix), CKD stage 3 who presented to [facility] ED with reports of neck swelling which has been there for some time but has gotten worse in past few days.   11/4 Pt continues on CRRT and nutrition needs listed below remain appropriate at this time. Pt on CLD for breakfast this AM and RN reports pt consumed nearly 100% of liquids. She states that diet has been advanced (Renal/Carb Modified) and that pt will receive solid foods for lunch.  Pt denies abdominal pain or nausea a this time and states he is feeling much better overall. Will order supplements as outlined above and monitor for tolerance/acceptance and adjust as needed.  Not able to meet needs currently. Medications reviewed. Labs reviewed; Ca: 8.6 mg/dL.   11/2 - Pt self-extubated this morning.  - Pt is NPO, will await diet advancement. If unable to advance diet, recommend starting nutrition support. - Patient was receiving Nepro at goal rate of 55 ml/hr.  11/1 - Patient in room with RN at bedside.  - Per RN, MD is okay with tube feeding advancement.  - Will advance Nepro to 30 ml/hr then advance further every 8 hours to goal rate of 55 ml/hr. - Patient is currently intubated on  ventilator support - MV: 7.5 L/min; Propofol: none  10/30 - Patient continues on CRRT.  - MD wanting to start TF with trickle feeds of Nepro. TF protocol inititiated.  - Patient has gained 26 lb since admission. - Patient is currently intubated on ventilator support - MV: 6.8 L/min; Propofol: none  Diet Order:  Diet renal/carb modified with fluid restriction Diet-HS Snack?: Nothing; Room service appropriate?: Yes; Fluid consistency:: Thin  Skin:   Wounds/incisions to: lower back, coccyx, R arm, and R buttocks  Last BM:  PTA  Height:   Ht Readings from Last 1 Encounters:  05/18/15 5\' 8"  (1.727 m)    Weight:   Wt Readings from Last 1 Encounters:  05/21/15 171 lb 8.3 oz (77.8 kg)    Ideal Body Weight:  70 kg  BMI:  Body mass index is 26.09 kg/(m^2).  Estimated Nutritional Needs:   Kcal:  2340-2730  Protein:  95-105g  Fluid:  1.2-1.5 L/day  EDUCATION NEEDS:   No education needs identified at this time     Jarome Matin, RD, LDN Inpatient Clinical Dietitian Pager # [phone] After hours/weekend pager # [phone]

## 2015-05-23 LAB — CBC WITH DIFFERENTIAL/PLATELET
BASOS ABS: 0 10*3/uL (ref 0.0–0.1)
BASOS PCT: 0 %
EOS ABS: 0.1 10*3/uL (ref 0.0–0.7)
EOS PCT: 1 %
HCT: 23 % — ABNORMAL LOW (ref 39.0–52.0)
Hemoglobin: 7.5 g/dL — ABNORMAL LOW (ref 13.0–17.0)
Lymphocytes Relative: 25 %
Lymphs Abs: 1.6 10*3/uL (ref 0.7–4.0)
MCH: 35.2 pg — ABNORMAL HIGH (ref 26.0–34.0)
MCHC: 32.6 g/dL (ref 30.0–36.0)
MCV: 108 fL — ABNORMAL HIGH (ref 78.0–100.0)
MONO ABS: 0.5 10*3/uL (ref 0.1–1.0)
MONOS PCT: 7 %
NEUTROS ABS: 4.4 10*3/uL (ref 1.7–7.7)
Neutrophils Relative %: 67 %
PLATELETS: 94 10*3/uL — AB (ref 150–400)
RBC: 2.13 MIL/uL — ABNORMAL LOW (ref 4.22–5.81)
RDW: 22.6 % — AB (ref 11.5–15.5)
WBC: 6.6 10*3/uL (ref 4.0–10.5)

## 2015-05-23 LAB — GLUCOSE, CAPILLARY
GLUCOSE-CAPILLARY: 108 mg/dL — AB (ref 65–99)
Glucose-Capillary: 94 mg/dL (ref 65–99)
Glucose-Capillary: 84 mg/dL (ref 65–99)

## 2015-05-23 LAB — PREPARE RBC (CROSSMATCH)

## 2015-05-23 LAB — ABO/RH: ABO/RH(D): O POS

## 2015-05-23 MED ORDER — HEPARIN SODIUM (PORCINE) 1000 UNIT/ML DIALYSIS: 1000.0000 [IU] | INTRAMUSCULAR | Status: DC | PRN

## 2015-05-23 MED ORDER — LIDOCAINE HCL (PF) 1 % IJ SOLN: 5.0000 mL | INTRAMUSCULAR | Status: DC | PRN

## 2015-05-23 MED ORDER — SODIUM CHLORIDE 0.9 % IV SOLN: 100.0000 mL | INTRAVENOUS | Status: DC | PRN

## 2015-05-23 MED ORDER — LIDOCAINE-PRILOCAINE 2.5-2.5 % EX CREA
1.0000 "application " | TOPICAL_CREAM | CUTANEOUS | Status: DC | PRN
  Filled 2015-05-23: qty 5

## 2015-05-23 MED ORDER — ALTEPLASE 2 MG IJ SOLR
2.0000 mg | Freq: Once | INTRAMUSCULAR | Status: DC | PRN
  Filled 2015-05-23: qty 2

## 2015-05-23 MED ORDER — PENTAFLUOROPROP-TETRAFLUOROETH EX AERO: 1.0000 "application " | INHALATION_SPRAY | CUTANEOUS | Status: DC | PRN

## 2015-05-23 MED ORDER — SODIUM CHLORIDE 0.9 % IV SOLN
Freq: Once | INTRAVENOUS | Status: AC
  Administered 2015-05-25: 500 mL via INTRAVENOUS

## 2015-05-23 NOTE — Progress Notes (Signed)
[facility] Progress Note   Subjective: starting to make some urine, on HD  Filed Vitals:   05/18/15 1000 05/18/15 1015 05/18/15 1030 05/18/15 1141  BP: 89/56  84/47 107/67  Pulse: 60 55 57 63  Temp: 96.6 F (35.9 C) 96.4 F (35.8 C) 96.4 F (35.8 C)   TempSrc:      Resp: 16 14 14 14   Height:      Weight:      SpO2: 100% 99% 99% 100%   Exam: Awake, a bit stronger and more talkative No jvd Chest clear bilat ant and lat RRR no MRG Abd soft dec'd BS no gross ascites GU dark amber urine clearing up 2+ diffuse edema of the LE's and UE's Neuro frail, nonfocal  UA 10/26 - 100 prot, 3-6 wbc, 7-10 rbc UNa 19 UCr 236 Urine M-spike is negative CXR 10/29 no active disease  Summary: 55 AAM w hx CKD III, HTN, dilated CM (ischemic), polysubstance abuse and homelessness admitted with recent ED visit 10.13 for n/v/, sore throat, diarrhea and rash rx'd as allergic rxn. Admitted then on 10.16 for neck/ facial swelling, cough, sore throat and hives. Eosinophilia on admission and ^LFT's of unclear etiology. Had BM bx on 10/25. BM cx's grew ECOli and blood cx's ended up growing enterobacter cloacae.    Assessment: 1 AKI due to sepsis/ shock / ATN - starting to make some urine. S/P CRRT. Plan regular HD today, then watch for 48 hrs.  2 CKD stage III, baseline creat 1.4- 1.8 3 ID on abx for GNR in blood and bone marrow cx's- on Rocephin 4 Shock - improving 5 ^LFT's supportive care 6 Hx dilated CM EF 25% 7 Vol excess - diffuse edema of extremities; admit 69kg/ peak wt 82kg, now at 74 kg pre HD today  Plan - HD today, dec volume  Kelly Splinter MD [facility] pager [phone] cell [phone] 05/19/2015, 2:42 PM    Last Labs      Recent Labs Lab 05/17/15 0500 05/17/15 1600 05/18/15 0415  NA 136 137 137  K 4.4 4.0 3.7  CL 105 105 104  CO2 27 28 27   GLUCOSE 145* 112* 130*   BUN 19 14 20   CREATININE 1.47* 1.05 1.31*  CALCIUM 7.0* 7.3* 7.6*  PHOS 3.3 2.7 2.9      Last Labs      Recent Labs Lab 05/15/15 0400  05/16/15 0415  05/17/15 0500 05/17/15 1600 05/18/15 0415  AST 981* --  572* --  --  --  386*  ALT 621* --  422* --  --  --  262*  ALKPHOS 211* --  170* --  --  --  186*  BILITOT 9.0* --  9.4* --  --  --  12.6*  PROT 5.7* --  5.2* --  --  --  5.6*  ALBUMIN 2.0*  2.1* < > 1.8* < > 1.8* 1.8* 1.8*  1.8*  < > = values in this interval not displayed.    Last Labs      Recent Labs Lab 05/16/15 0415 05/17/15 0500 05/18/15 0415  WBC 20.4* 20.2* 20.5*  NEUTROABS 17.0* 14.6* 13.4*  HGB 10.6* 9.4* 9.0*  HCT 30.7* 27.6* 25.7*  MCV 107.0* 103.8* 108.0*  PLT 152 146* 146*     . antiseptic oral rinse 7 mL Mouth Rinse QID  . aspirin 81 mg Oral Daily  . cefTRIAXone (ROCEPHIN) IV 2 g Intravenous Q24H  . chlorhexidine gluconate 15 mL Mouth Rinse BID  .  clopidogrel 75 mg Oral Daily  . diphenhydrAMINE 25 mg Per Tube 4 times per day  . famotidine 20 mg Oral Daily  . feeding supplement (NEPRO CARB STEADY) 1,000 mL Per Tube Q24H  . fentaNYL (SUBLIMAZE) injection 50 mcg Intravenous Once  . hydrocortisone sodium succinate 100 mg Intravenous Q12H  . insulin aspart 0-9 Units Subcutaneous 6 times per day  . mirabegron ER 25 mg Oral Daily  . sodium chloride 3 mL Intravenous Q12H   . dextrose 5 % and 0.45% NaCl 25 mL/hr at 05/18/15 1300  . fentaNYL infusion INTRAVENOUS 100 mcg/hr (05/18/15 1300)  . heparin 10,000 units/ 20 mL infusion syringe 700 Units/hr (05/18/15 1300)  . norepinephrine (LEVOPHED) Adult infusion 4.053 mcg/min (05/18/15 1300)  . dialysis replacement fluid (prismasate) 200 mL/hr at 05/17/15 1220  . dialysis replacement fluid  (prismasate) 400 mL/hr at 05/18/15 0149  . dialysate (PRISMASATE) 1,500 mL/hr at 05/18/15 1012   [CANCELED] Place/Maintain arterial line **AND** sodium chloride, acetaminophen **OR** acetaminophen, fentaNYL, heparin, heparin, heparin, LORazepam, ondansetron **OR** ondansetron (ZOFRAN) IV, sodium chloride, technetium TC 4M mebrofenin

## 2015-05-23 NOTE — Progress Notes (Signed)
Pt. Currently in HD and unavailable for PT eval.  Will follow up tomorrow.    Gerlean Ren PT [facility] [place] [phone]

## 2015-05-23 NOTE — Progress Notes (Signed)
[facility] PROGRESS NOTE  [name] LPF:[id number] DOB: 04-10-1956 DOA: 05/03/2015 PCP: Charolette Forward, MD  Brief narrative 59 year old homeless male with history of hypertension, and ST MI, arthritis, anxiety/depression, history of processing abuse with cardiomyopathy (EF of 20%), chronic kidney disease stage IV, hyperlipidemia, history of noncompliance presented to [facility] long hospital on 10/16 with neck/facial swelling with hives. He also had intermittent rash and fever. He had transaminitis (AST 299, ALT of 283 and total bili of 3.9) CT of the neck showed asymmetric enlargement of the right second mandibular gland which was nonspecific. He had significant eosinophilia on his white count. Patient was placed on empiric clindamycin and then transitioned to broad-spectrum coverage with vancomycin and Zosyn. Patient's blood cultures was also growing Enterobacter. ID consulted.  a bone marrow biopsy was done to evaluate  eosinophilia and autoimmune workup sent. On 10/26 he went into respiratory distress with septic shock and transferred to ICU requiring intubation...  He also developed shock liver with oliguric renal failure requiring CVVH. Vision self extubated on 11/2. Patient now showing improved urinary output and transferred to [facility] for intermittent dialysis.  SIGNIFICANT EVENTS  10/13 Seen in ER for N/V, sore throat, diarrhea & rash 10/16 Admit to [facility] for facial swelling, cough with frothy yellow sputum, sore throat and hives. 10/26 bone marrow bx done as part of evaluation for eosinophilia. Developed worsening distress later that afternoon. intubated 10/27 cvvh started 10/28-10/31: Bone marrow bx from 26th= ecoli, BCx2 showing Enterobacter. ID following. By 10/30 pressors weaning. Still on CRRT. 10/31 attempting weaning efforts.  10/31: BAL to eval to help explain: unexplained syndrome characterized by fever, hypereosinophilia with high IgE. Primaxin changed to Rocephin by  ID 11/1: sedated. No spont effort during weaning. Fentanyl gtt stopped.  11/2 self extubated 11/4 transferred to [facility] the hospitalist service for need of intermittent dialysis.    Assessment/Plan: Acute hypoxic respiratory failure Possibly associated with sepsis. Bronchoscopy showing tracheobronchitis. Biopsy growing Escherichia coli. He debated from 10/26-11/2. Currently stable. Bedside incentive spirometry.  Septic shock with shock liver and oliguric renal failure Sepsis currently resolved. LFTs improving. Monitoring a.m. Good urine output past 24 hours. Getting intermittent dialysis. Baseline creatinine of 1.4-1.8. Creatinine is stable currently. Will defer to renal for duration of dialysis.  Septic shock due to Enterobacter. Required pressors and intubation along with CVVHD. Source unclear. Bronchial secretion on BAL use sensitive Escherichia coli. Also bone marrow biopsy showing Escherichia coli without clear source. Continue empiric Rocephin. Duration of antibiotic per ID.  Escherichia coli on bone marrow biopsy No clear source. Unclear if this is contaminant. ID suggesting a possible need for colonoscopy.  Eosinophilia No clear etiology. Patient on steroid and eosinophilia has resolved. Weaning steroids. Ova and parasite from bronchial lavage negative.  Dilated cardiomyopathy EF of 25-30% with diffuse hypokinesis. Severely reduced biventricular function. Continue aspirin, Coreg, Plavix.  Severe protein -calorie malnutrition Continue supplemental.  Anemia  possibly of chronic disease.  Diet: renal   Code Status: Full code Family Communication: None at bedside Disposition Plan: Continue inpatient. Possibly needs skilled nursing facility upon discharge.   Consultants:  Renal  PC CM  ID  Procedures:  10/13 RUQ [place] - mild gallbladder wall thickening, no gallstones observed 10/16 CXR - no acute infiltrate 10/16 CT Soft tissue Neck - asymmetric  enlargement of R submandibular gland relative to the left is non-specific but could be due to infection, inflammation or tumor. Negative for abscess, diffuse subcutaneous edema 10/17 MRI Soft Tissue Neck - motion  degraded study, no discrete mass, grossly similar subcutaneous edema involving the bilateral submandibular spaces (nonspecific but must consider infection/cellulitis), no discrete abscess  10/21 CXR - no acute infiltrate 10/21 CT Abd/Pelvis - diffuse body wall edema suggestive of anasarca, mesenteric edema but no overt ascites, no acute intra-abdominal abnormalities, stable prostate gland enlargement and small hiatal hernia 10/26 TTE - LV & RV moderately dilated. EF 25-30% w/ diffuse hypokinesis of LV. Moderate MR. No pericardial effusion. 10/26 Renal US - Medical renal disease. Right pleural effusion. No hydronephrosis. 10/27 CT chest, abd, pelvis - Bibasilar atelectasis/ early infiltrate, anasarca. 10/29 RUQ [place] - no stones. No perichole fluid. CBD 94m. No focal liver lesion. Right pleural effusion. 10/31 bronchoscopy > thick secretions, BAL sent  Antibiotics:  Ceftriaxone 10/31--  Vancomycin 10/16-20, 26-27  Primaxin 10/27-10/31  Zosyn 10/17, 10/21, 10/26-10/27  Clindamycin 10/16-10/17  HPI/Subjective: Seen and examined. Denies any shortness of breath or pain.  Objective: Filed Vitals:   05/23/15 1430  BP: 115/71  Pulse: 101  Temp:   Resp: 20    Intake/Output Summary (Last 24 hours) at 05/23/15 1455 Last data filed at 05/23/15 1235  Gross per 24 hour  Intake    750 ml  Output      0 ml  Net    750 ml   Filed Weights   05/21/15 0500 05/22/15 1736 05/23/15 1225  Weight: 77.8 kg (171 lb 8.3 oz) 71.6 kg (157 lb 13.6 oz) 74.4 kg (164 lb 0.4 oz)    Exam:   General:  Elderly male lying in bed in no acute distress  HEENT:Pallor+ Temporal wasting, icteric ,moist oral mucosa, rt IJ  Chest: Clear to auscultation bilaterally  CVS: Normal S1 and S2, no  murmurs rub or gallop  J: Soft, nondistended, nontender, bowel sounds present, Foley with dark urine  Musculoskeletal : Warm, no edema    Data Reviewed: Basic Metabolic Panel:  Recent Labs Lab 05/18/15 0415  05/19/15 0449  05/20/15 0515 05/20/15 1610 05/21/15 0420 05/21/15 1611 05/22/15 0501  NA 137  < > 138  < > 138 137 138 137 137  K 3.7  < > 3.7  < > 3.8 3.7 4.1 4.3 4.6  CL 104  < > 105  < > 107 102 105 103 104  CO2 27  < > 30  < > '28 28 28 28 29  ' GLUCOSE 130*  < > 108*  < > 134* 111* 96 112* 107*  BUN 20  < > 19  < > '19 17 17 17 17  ' CREATININE 1.31*  < > 1.26*  < > 1.25* 1.13 1.27* 1.22 1.10  CALCIUM 7.6*  < > 7.9*  < > 8.1* 8.4* 8.8* 8.6* 8.6*  MG 2.3  --  2.4  --  2.3  --  2.3  --  2.2  PHOS 2.9  < > 2.9  < > 2.8 2.6 3.1 3.0 2.9  < > = values in this interval not displayed. Liver Function Tests:  Recent Labs Lab 05/18/15 0415  05/20/15 0515 05/20/15 1610 05/21/15 0420 05/21/15 1611 05/22/15 0501  AST 386*  --  222*  --   --   --   --   ALT 262*  --  178*  --   --   --   --   ALKPHOS 186*  --  236*  --   --   --   --   BILITOT 12.6*  --  10.4*  --   --   --   --  PROT 5.6*  --  5.7*  --   --   --   --   ALBUMIN 1.8*  1.8*  < > 1.8*  1.9* 2.1* 2.3* 2.3* 2.3*  < > = values in this interval not displayed. No results for input(s): LIPASE, AMYLASE in the last 168 hours. No results for input(s): AMMONIA in the last 168 hours. CBC:  Recent Labs Lab 05/19/15 0449 05/20/15 0515 05/21/15 0420 05/22/15 0501 05/23/15 0505  WBC 16.1* 15.5* 16.3* 12.2* 6.6  NEUTROABS 10.8* 10.9* 11.9* 8.8* 4.4  HGB 8.7* 8.3* 8.5* 9.0* 7.5*  HCT 26.3* 25.0* 25.2* 27.5* 23.0*  MCV 107.3* 107.3* 109.1* 110.4* 108.0*  PLT 127* 128* 126* 121* 94*   Cardiac Enzymes: No results for input(s): CKTOTAL, CKMB, CKMBINDEX, TROPONINI in the last 168 hours. BNP (last 3 results)  Recent Labs  04/30/15 1459 05/03/15 1009 05/07/15 1439  BNP 1557.1* 752.1* 1402.2*    ProBNP (last  3 results)  Recent Labs  04/13/15 1012  PROBNP 3040.00*    CBG:  Recent Labs Lab 05/22/15 0802 05/22/15 1146 05/22/15 1429 05/22/15 1743 05/23/15 0829  GLUCAP 96 118* 127* 96 94    Recent Results (from the past 240 hour(s))  MRSA PCR Screening     Status: None   Collection Time: 05/13/15  2:59 PM  Result Value Ref Range Status   MRSA by PCR NEGATIVE NEGATIVE Final    Comment:        The GeneXpert MRSA Assay (FDA approved for NASAL specimens only), is one component of a comprehensive MRSA colonization surveillance program. It is not intended to diagnose MRSA infection nor to guide or monitor treatment for MRSA infections.   Culture, blood (x 2)     Status: None   Collection Time: 05/13/15  4:05 PM  Result Value Ref Range Status   Specimen Description BLOOD RIGHT ARM  Final   Special Requests BOTTLES DRAWN AEROBIC AND ANAEROBIC  5CC  Final   Culture  Setup Time   Final    GRAM NEGATIVE RODS AEROBIC BOTTLE ONLY CRITICAL RESULT CALLED TO, READ BACK BY AND VERIFIED WITH: L FREI,RN AT 9983 05/14/15 BY L BENFIELD    Culture   Final    ENTEROBACTER CLOACAE Performed at [facility]    Report Status 05/16/2015 FINAL  Final   Organism ID, Bacteria ENTEROBACTER CLOACAE  Final      Susceptibility   Enterobacter cloacae - MIC*    CEFAZOLIN >=64 RESISTANT Resistant     CEFEPIME <=1 SENSITIVE Sensitive     CEFTAZIDIME <=1 SENSITIVE Sensitive     CEFTRIAXONE <=1 SENSITIVE Sensitive     CIPROFLOXACIN <=0.25 SENSITIVE Sensitive     GENTAMICIN <=1 SENSITIVE Sensitive     IMIPENEM 1 SENSITIVE Sensitive     TRIMETH/SULFA <=20 SENSITIVE Sensitive     PIP/TAZO <=4 SENSITIVE Sensitive     * ENTEROBACTER CLOACAE  Culture, blood (x 2)     Status: None   Collection Time: 05/13/15  4:24 PM  Result Value Ref Range Status   Specimen Description BLOOD RIGHT ARM  Final   Special Requests BOTTLES DRAWN AEROBIC AND ANAEROBIC  5CC  Final   Culture  Setup Time   Final    GRAM  NEGATIVE RODS IN BOTH AEROBIC AND ANAEROBIC BOTTLES CRITICAL RESULT CALLED TO, READ BACK BY AND VERIFIED WITH: S TROTS '@0600'  05/14/15 MKELLY    Culture   Final    ENTEROBACTER CLOACAE SUSCEPTIBILITIES PERFORMED ON PREVIOUS CULTURE WITHIN  THE LAST 5 DAYS. Performed at [facility]    Report Status 05/16/2015 FINAL  Final  Ova and parasite examination     Status: None   Collection Time: 05/14/15  4:25 PM  Result Value Ref Range Status   Specimen Description STOOL  Final   Special Requests NONE  Final   Ova and parasites   Final    NO OVA OR PARASITES SEEN Performed at [facility]    Report Status 05/15/2015 FINAL  Final  Stool culture     Status: None   Collection Time: 05/14/15  4:25 PM  Result Value Ref Range Status   Specimen Description STOOL  Final   Special Requests NONE  Final   Culture   Final    NO SALMONELLA, SHIGELLA, CAMPYLOBACTER, YERSINIA, OR E.COLI 0157:H7 ISOLATED Performed at [facility]    Report Status 05/18/2015 FINAL  Final  Ova and parasite examination     Status: None   Collection Time: 05/18/15 12:30 PM  Result Value Ref Range Status   Specimen Description BRONCHIAL ALVEOLAR LAVAGE  Final   Special Requests Normal  Final   Ova and parasites   Final    NO OVA OR PARASITES SEEN Performed at [facility]    Report Status 05/21/2015 FINAL  Final  Culture, respiratory (NON-Expectorated)     Status: None   Collection Time: 05/18/15 12:30 PM  Result Value Ref Range Status   Specimen Description BRONCHIAL ALVEOLAR LAVAGE  Final   Special Requests Normal  Final   Gram Stain   Final    ABUNDANT WBC PRESENT, PREDOMINANTLY PMN NO SQUAMOUS EPITHELIAL CELLS SEEN NO ORGANISMS SEEN Performed at [facility]    Culture   Final    RARE ESCHERICHIA COLI Performed at [facility]    Report Status 05/21/2015 FINAL  Final   Organism ID, Bacteria ESCHERICHIA COLI  Final      Susceptibility   Escherichia coli -  MIC*    AMPICILLIN >=32 RESISTANT Resistant     AMPICILLIN/SULBACTAM >=32 RESISTANT Resistant     CEFEPIME <=1 SENSITIVE Sensitive     CEFTAZIDIME <=1 SENSITIVE Sensitive     CEFTRIAXONE <=1 SENSITIVE Sensitive     CIPROFLOXACIN 0.5 SENSITIVE Sensitive     GENTAMICIN >=16 RESISTANT Resistant     IMIPENEM <=0.25 SENSITIVE Sensitive     PIP/TAZO <=4 SENSITIVE Sensitive     TOBRAMYCIN 8 INTERMEDIATE Intermediate     TRIMETH/SULFA Value in next row Sensitive      <=20 SENSITIVE(NOTE)    * RARE ESCHERICHIA COLI  Fungus Culture with Smear     Status: None (Preliminary result)   Collection Time: 05/18/15 12:30 PM  Result Value Ref Range Status   Specimen Description BRONCHIAL ALVEOLAR LAVAGE  Final   Special Requests Normal  Final   Fungal Smear   Final    NO YEAST OR FUNGAL ELEMENTS SEEN Performed at [facility]    Culture   Final    CULTURE IN PROGRESS FOR FOUR WEEKS Performed at [facility]    Report Status PENDING  Incomplete  AFB culture with smear     Status: None (Preliminary result)   Collection Time: 05/18/15 12:30 PM  Result Value Ref Range Status   Specimen Description BRONCHIAL ALVEOLAR LAVAGE  Final   Special Requests Normal  Final   Acid Fast Smear   Final    NO ACID FAST BACILLI SEEN Performed at [facility]  Culture   Final    CULTURE WILL BE EXAMINED FOR 6 WEEKS BEFORE ISSUING A FINAL REPORT Performed at [facility]    Report Status PENDING  Incomplete     Studies: Dg Chest Port 1 View  05/22/2015  CLINICAL DATA:  Septic shock. EXAM: PORTABLE CHEST 1 VIEW COMPARISON:  05/16/2015 FINDINGS: Right jugular temporary dialysis catheter remains in place. Left jugular central line is been removed. Nasogastric tube has been removed. There is no evidence of pulmonary edema, consolidation, pneumothorax, nodule or pleural fluid. The heart size is stable and at the upper limits of normal. IMPRESSION: No active disease. Electronically  Signed   By: Aletta Edouard M.D.   On: 05/22/2015 07:18    Scheduled Meds: . sodium chloride   Intravenous Once  . aspirin  81 mg Oral Daily  . carvedilol  3.125 mg Oral BID WC  . cefTRIAXone (ROCEPHIN)  IV  2 g Intravenous Q24H  . clopidogrel  75 mg Oral Daily  . feeding supplement (GLUCERNA SHAKE)  237 mL Oral BID BM  . feeding supplement (PRO-STAT SUGAR FREE 64)  30 mL Oral q morning - 10a  . hydrocerin   Topical TID  . hydrocortisone  10 mg Oral BID  . insulin aspart  0-9 Units Subcutaneous TID WC  . sodium chloride  3 mL Intravenous Q12H   Continuous Infusions:     Time spent: 25 minutes    Masa Lubin, [place]  [facility] Pager [phone]. If 7PM-7AM, please contact night-coverage at [email or web address], password [facility] 05/23/2015, 2:55 PM  LOS: 19 days

## 2015-05-24 DIAGNOSIS — D696 Thrombocytopenia, unspecified: Secondary | ICD-10-CM

## 2015-05-24 LAB — COMPREHENSIVE METABOLIC PANEL
ALK PHOS: 178 U/L — AB (ref 38–126)
ALT: 115 U/L — ABNORMAL HIGH (ref 17–63)
ANION GAP: 8 (ref 5–15)
AST: 93 U/L — ABNORMAL HIGH (ref 15–41)
Albumin: 1.8 g/dL — ABNORMAL LOW (ref 3.5–5.0)
BILIRUBIN TOTAL: 5.2 mg/dL — AB (ref 0.3–1.2)
BUN: 18 mg/dL (ref 6–20)
CALCIUM: 8.6 mg/dL — AB (ref 8.9–10.3)
CO2: 28 mmol/L (ref 22–32)
CREATININE: 1.7 mg/dL — AB (ref 0.61–1.24)
Chloride: 101 mmol/L (ref 101–111)
GFR, EST AFRICAN AMERICAN: 48 mL/min — AB (ref >60)
GFR, EST NON AFRICAN AMERICAN: 42 mL/min — AB (ref >60)
Glucose, Bld: 85 mg/dL (ref 65–99)
Potassium: 4.4 mmol/L (ref 3.5–5.1)
SODIUM: 137 mmol/L (ref 135–145)
TOTAL PROTEIN: 5.3 g/dL — AB (ref 6.5–8.1)

## 2015-05-24 LAB — CBC WITH DIFFERENTIAL/PLATELET
BASOS ABS: 0 10*3/uL (ref 0.0–0.1)
Basophils Relative: 0 %
EOS ABS: 0.1 10*3/uL (ref 0.0–0.7)
Eosinophils Relative: 1 %
HCT: 27.1 % — ABNORMAL LOW (ref 39.0–52.0)
Hemoglobin: 8.9 g/dL — ABNORMAL LOW (ref 13.0–17.0)
Lymphocytes Relative: 23 %
Lymphs Abs: 1.4 10*3/uL (ref 0.7–4.0)
MCH: 34.4 pg — ABNORMAL HIGH (ref 26.0–34.0)
MCHC: 32.8 g/dL (ref 30.0–36.0)
MCV: 104.6 fL — ABNORMAL HIGH (ref 78.0–100.0)
MONO ABS: 0.4 10*3/uL (ref 0.1–1.0)
MONOS PCT: 6 %
Neutro Abs: 4.2 10*3/uL (ref 1.7–7.7)
Neutrophils Relative %: 70 %
PLATELETS: 89 10*3/uL — AB (ref 150–400)
RBC: 2.59 MIL/uL — AB (ref 4.22–5.81)
RDW: 22.8 % — AB (ref 11.5–15.5)
WBC: 6.1 10*3/uL (ref 4.0–10.5)

## 2015-05-24 LAB — TYPE AND SCREEN
ABO/RH(D): O POS
Antibody Screen: NEGATIVE
UNIT DIVISION: 0

## 2015-05-24 LAB — GLUCOSE, CAPILLARY
GLUCOSE-CAPILLARY: 74 mg/dL (ref 65–99)
GLUCOSE-CAPILLARY: 118 mg/dL — AB (ref 65–99)
GLUCOSE-CAPILLARY: 99 mg/dL (ref 65–99)
Glucose-Capillary: 115 mg/dL — ABNORMAL HIGH (ref 65–99)

## 2015-05-24 LAB — HEPATITIS B SURFACE ANTIBODY,QUALITATIVE: HEP B S AB: NONREACTIVE

## 2015-05-24 LAB — HEPATITIS B CORE ANTIBODY, TOTAL: HEP B C TOTAL AB: NEGATIVE

## 2015-05-24 NOTE — Progress Notes (Signed)
[facility] PROGRESS NOTE  [name] QMV:[id number] DOB: May 09, 1956 DOA: 05/03/2015 PCP: Charolette Forward, MD  Brief narrative 59 year old homeless male with history of hypertension, and ST MI, arthritis, anxiety/depression, history of processing abuse with cardiomyopathy (EF of 20%), chronic kidney disease stage IV, hyperlipidemia, history of noncompliance presented to [facility] long hospital on 10/16 with neck/facial swelling with hives. He also had intermittent rash and fever. He had transaminitis (AST 299, ALT of 283 and total bili of 3.9) CT of the neck showed asymmetric enlargement of the right second mandibular gland which was nonspecific. He had significant eosinophilia on his white count. Patient was placed on empiric clindamycin and then transitioned to broad-spectrum coverage with vancomycin and Zosyn. Patient's blood cultures was also growing Enterobacter. ID consulted.  a bone marrow biopsy was done to evaluate  eosinophilia and autoimmune workup sent. On 10/26 he went into respiratory distress with septic shock and transferred to ICU requiring intubation...  He also developed shock liver with oliguric renal failure requiring CVVH. Vision self extubated on 11/2. Patient now showing improved urinary output and transferred to [facility] for intermittent dialysis.  SIGNIFICANT EVENTS  10/13 Seen in ER for N/V, sore throat, diarrhea & rash 10/16 Admit to [facility] for facial swelling, cough with frothy yellow sputum, sore throat and hives. 10/26 bone marrow bx done as part of evaluation for eosinophilia. Developed worsening distress later that afternoon. intubated 10/27 cvvh started 10/28-10/31: Bone marrow bx from 26th= ecoli, BCx2 showing Enterobacter. ID following. By 10/30 pressors weaning. Still on CRRT. 10/31 attempting weaning efforts.  10/31: BAL to eval to help explain: unexplained syndrome characterized by fever, hypereosinophilia with high IgE. Primaxin changed to Rocephin by  ID 11/1: sedated. No spont effort during weaning. Fentanyl gtt stopped.  11/2 self extubated 11/4 transferred to [facility] hospitalist service for need of intermittent dialysis.    Assessment/Plan: Acute hypoxic respiratory failure Possibly associated with sepsis. Bronchoscopy showing tracheobronchitis. Biopsy growing Escherichia coli. Intubated from 10/26-11/2. Currently stable. Bedside incentive spirometry.  Septic shock with shock liver and oliguric renal failure Sepsis currently resolved. LFTs improving.  Improved urine output past 24 hours. Getting intermittent dialysis. Baseline creatinine of 1.4-1.8. Creatinine is stable currently. Renal following closely.  Septic shock due to Enterobacter. Required pressors and intubation along with CVVHD. Source unclear. Bronchial secretion on BAL use sensitive Escherichia coli. Also bone marrow biopsy showing Escherichia coli without clear source. Continue empiric Rocephin. Duration of antibiotic per ID.  Escherichia coli on bone marrow biopsy No clear source. Unclear if this is contaminant. ID suggesting a possible need for colonoscopy.  Eosinophilia No clear etiology. Patient on steroid and eosinophilia has resolved. Weaning steroids. Ova and parasite from bronchial lavage negative.  Dilated cardiomyopathy EF of 25-30% with diffuse hypokinesis. Severely reduced biventricular function. Continue aspirin, Coreg, Plavix.  Severe protein -calorie malnutrition Continue supplemental.  Thrombocytopenia Drop in platelets progressively. Stopped pepcid and plavix. Will monitor.   Anemia  possibly of chronic disease.  Diet: renal/ diabetic   Code Status: Full code Family Communication: None at bedside Disposition Plan: Continue inpatient. Possibly needs skilled nursing facility upon discharge.   Consultants:  Renal  PC CM  ID  Procedures:  10/13 RUQ [place] - mild gallbladder wall thickening, no gallstones observed 10/16  CXR - no acute infiltrate 10/16 CT Soft tissue Neck - asymmetric enlargement of R submandibular gland relative to the left is non-specific but could be due to infection, inflammation or tumor. Negative for abscess, diffuse subcutaneous edema  10/17 MRI Soft Tissue Neck - motion degraded study, no discrete mass, grossly similar subcutaneous edema involving the bilateral submandibular spaces (nonspecific but must consider infection/cellulitis), no discrete abscess  10/21 CXR - no acute infiltrate 10/21 CT Abd/Pelvis - diffuse body wall edema suggestive of anasarca, mesenteric edema but no overt ascites, no acute intra-abdominal abnormalities, stable prostate gland enlargement and small hiatal hernia 10/26 TTE - LV & RV moderately dilated. EF 25-30% w/ diffuse hypokinesis of LV. Moderate MR. No pericardial effusion. 10/26 Renal US - Medical renal disease. Right pleural effusion. No hydronephrosis. 10/27 CT chest, abd, pelvis - Bibasilar atelectasis/ early infiltrate, anasarca. 10/29 RUQ [place] - no stones. No perichole fluid. CBD 34m. No focal liver lesion. Right pleural effusion. 10/31 bronchoscopy > thick secretions, BAL sent  Antibiotics:  Ceftriaxone 10/31--  Vancomycin 10/16-20, 26-27  Primaxin 10/27-10/31  Zosyn 10/17, 10/21, 10/26-10/27  Clindamycin 10/16-10/17  HPI/Subjective: Seen and examined. Reports feeling better  Objective: Filed Vitals:   05/24/15 0746  BP: 113/66  Pulse: 62  Temp: 98.5 F (36.9 C)  Resp: 17    Intake/Output Summary (Last 24 hours) at 05/24/15 1137 Last data filed at 05/24/15 0842  Gross per 24 hour  Intake   1255 ml  Output   3355 ml  Net  -2100 ml   Filed Weights   05/23/15 1225 05/23/15 1612 05/23/15 1955  Weight: 74.4 kg (164 lb 0.4 oz) 72.2 kg (159 lb 2.8 oz) 72.5 kg (159 lb 13.3 oz)    Exam:   General:   no acute distress  HEENT:Pallor+ Temporal wasting, icteric ,moist oral mucosa, rt IJ  Chest: Clear to auscultation  bilaterally  CVS: Normal S1 and S2, no murmurs rub or gallop  GI: Soft, nondistended, nontender, bowel sounds present, Foley with dark urine  Musculoskeletal : Warm, no edema    Data Reviewed: Basic Metabolic Panel:  Recent Labs Lab 05/18/15 0415  05/19/15 0449  05/20/15 0515 05/20/15 1610 05/21/15 0420 05/21/15 1611 05/22/15 0501 05/24/15 0522  NA 137  < > 138  < > 138 137 138 137 137 137  K 3.7  < > 3.7  < > 3.8 3.7 4.1 4.3 4.6 4.4  CL 104  < > 105  < > 107 102 105 103 104 101  CO2 27  < > 30  < > '28 28 28 28 29 28  ' GLUCOSE 130*  < > 108*  < > 134* 111* 96 112* 107* 85  BUN 20  < > 19  < > '19 17 17 17 17 18  ' CREATININE 1.31*  < > 1.26*  < > 1.25* 1.13 1.27* 1.22 1.10 1.70*  CALCIUM 7.6*  < > 7.9*  < > 8.1* 8.4* 8.8* 8.6* 8.6* 8.6*  MG 2.3  --  2.4  --  2.3  --  2.3  --  2.2  --   PHOS 2.9  < > 2.9  < > 2.8 2.6 3.1 3.0 2.9  --   < > = values in this interval not displayed. Liver Function Tests:  Recent Labs Lab 05/18/15 0415  05/20/15 0515 05/20/15 1610 05/21/15 0420 05/21/15 1611 05/22/15 0501 05/24/15 0522  AST 386*  --  222*  --   --   --   --  93*  ALT 262*  --  178*  --   --   --   --  115*  ALKPHOS 186*  --  236*  --   --   --   --  178*  BILITOT 12.6*  --  10.4*  --   --   --   --  5.2*  PROT 5.6*  --  5.7*  --   --   --   --  5.3*  ALBUMIN 1.8*  1.8*  < > 1.8*  1.9* 2.1* 2.3* 2.3* 2.3* 1.8*  < > = values in this interval not displayed. No results for input(s): LIPASE, AMYLASE in the last 168 hours. No results for input(s): AMMONIA in the last 168 hours. CBC:  Recent Labs Lab 05/20/15 0515 05/21/15 0420 05/22/15 0501 05/23/15 0505 05/24/15 0522  WBC 15.5* 16.3* 12.2* 6.6 6.1  NEUTROABS 10.9* 11.9* 8.8* 4.4 4.2  HGB 8.3* 8.5* 9.0* 7.5* 8.9*  HCT 25.0* 25.2* 27.5* 23.0* 27.1*  MCV 107.3* 109.1* 110.4* 108.0* 104.6*  PLT 128* 126* 121* 94* 89*   Cardiac Enzymes: No results for input(s): CKTOTAL, CKMB, CKMBINDEX, TROPONINI in the last 168  hours. BNP (last 3 results)  Recent Labs  04/30/15 1459 05/03/15 1009 05/07/15 1439  BNP 1557.1* 752.1* 1402.2*    ProBNP (last 3 results)  Recent Labs  04/13/15 1012  PROBNP 3040.00*    CBG:  Recent Labs Lab 05/22/15 1743 05/23/15 0829 05/23/15 1717 05/23/15 1954 05/24/15 0722  GLUCAP 96 94 84 108* 74    Recent Results (from the past 240 hour(s))  Ova and parasite examination     Status: None   Collection Time: 05/14/15  4:25 PM  Result Value Ref Range Status   Specimen Description STOOL  Final   Special Requests NONE  Final   Ova and parasites   Final    NO OVA OR PARASITES SEEN Performed at [facility]    Report Status 05/15/2015 FINAL  Final  Stool culture     Status: None   Collection Time: 05/14/15  4:25 PM  Result Value Ref Range Status   Specimen Description STOOL  Final   Special Requests NONE  Final   Culture   Final    NO SALMONELLA, SHIGELLA, CAMPYLOBACTER, YERSINIA, OR E.COLI 0157:H7 ISOLATED Performed at [facility]    Report Status 05/18/2015 FINAL  Final  Ova and parasite examination     Status: None   Collection Time: 05/18/15 12:30 PM  Result Value Ref Range Status   Specimen Description BRONCHIAL ALVEOLAR LAVAGE  Final   Special Requests Normal  Final   Ova and parasites   Final    NO OVA OR PARASITES SEEN Performed at [facility]    Report Status 05/21/2015 FINAL  Final  Culture, respiratory (NON-Expectorated)     Status: None   Collection Time: 05/18/15 12:30 PM  Result Value Ref Range Status   Specimen Description BRONCHIAL ALVEOLAR LAVAGE  Final   Special Requests Normal  Final   Gram Stain   Final    ABUNDANT WBC PRESENT, PREDOMINANTLY PMN NO SQUAMOUS EPITHELIAL CELLS SEEN NO ORGANISMS SEEN Performed at [facility]    Culture   Final    RARE ESCHERICHIA COLI Performed at [facility]    Report Status 05/21/2015 FINAL  Final   Organism ID, Bacteria ESCHERICHIA COLI  Final       Susceptibility   Escherichia coli - MIC*    AMPICILLIN >=32 RESISTANT Resistant     AMPICILLIN/SULBACTAM >=32 RESISTANT Resistant     CEFEPIME <=1 SENSITIVE Sensitive     CEFTAZIDIME <=1 SENSITIVE Sensitive     CEFTRIAXONE <=1 SENSITIVE Sensitive  CIPROFLOXACIN 0.5 SENSITIVE Sensitive     GENTAMICIN >=16 RESISTANT Resistant     IMIPENEM <=0.25 SENSITIVE Sensitive     PIP/TAZO <=4 SENSITIVE Sensitive     TOBRAMYCIN 8 INTERMEDIATE Intermediate     TRIMETH/SULFA Value in next row Sensitive      <=20 SENSITIVE(NOTE)    * RARE ESCHERICHIA COLI  Fungus Culture with Smear     Status: None (Preliminary result)   Collection Time: 05/18/15 12:30 PM  Result Value Ref Range Status   Specimen Description BRONCHIAL ALVEOLAR LAVAGE  Final   Special Requests Normal  Final   Fungal Smear   Final    NO YEAST OR FUNGAL ELEMENTS SEEN Performed at [facility]    Culture   Final    CULTURE IN PROGRESS FOR FOUR WEEKS Performed at [facility]    Report Status PENDING  Incomplete  AFB culture with smear     Status: None (Preliminary result)   Collection Time: 05/18/15 12:30 PM  Result Value Ref Range Status   Specimen Description BRONCHIAL ALVEOLAR LAVAGE  Final   Special Requests Normal  Final   Acid Fast Smear   Final    NO ACID FAST BACILLI SEEN Performed at [facility]    Culture   Final    CULTURE WILL BE EXAMINED FOR 6 WEEKS BEFORE ISSUING A FINAL REPORT Performed at [facility]    Report Status PENDING  Incomplete     Studies: No results found.  Scheduled Meds: . sodium chloride   Intravenous Once  . aspirin  81 mg Oral Daily  . carvedilol  3.125 mg Oral BID WC  . cefTRIAXone (ROCEPHIN)  IV  2 g Intravenous Q24H  . clopidogrel  75 mg Oral Daily  . feeding supplement (GLUCERNA SHAKE)  237 mL Oral BID BM  . feeding supplement (PRO-STAT SUGAR FREE 64)  30 mL Oral q morning - 10a  . hydrocerin   Topical TID  . hydrocortisone  10 mg Oral  BID  . insulin aspart  0-9 Units Subcutaneous TID WC  . sodium chloride  3 mL Intravenous Q12H   Continuous Infusions:     Time spent: 25 minutes    Laurita Peron, [place]  [facility] Pager [phone]. If 7PM-7AM, please contact night-coverage at [email or web address], password [facility] 05/24/2015, 11:37 AM  LOS: 20 days

## 2015-05-24 NOTE — Progress Notes (Signed)
ANTIBIOTIC CONSULT NOTE - Follow up  Pharmacy Consult for Rocephin Indication: Sepsis  No Known Allergies  Patient Measurements: Height: 5\' 8"  (172.7 cm) Weight: 159 lb 13.3 oz (72.5 kg) IBW/kg (Calculated) : 68.4  Vital Signs: Temp: 98.5 F (36.9 C) (11/06 0746) Temp Source: Oral (11/06 0746) BP: 113/66 mmHg (11/06 0746) Pulse Rate: 62 (11/06 0746) Intake/Output from previous day: 11/05 0701 - 11/06 0700 In: 1015 [P.O.:580; I.V.:50; Blood:335; IV Piggyback:50] Out: 2905 [Urine:600] Intake/Output from this shift: Total I/O In: 240 [P.O.:240] Out: 450 [Urine:450]  Labs:  Recent Labs  05/21/15 1611 05/22/15 0501 05/23/15 0505 05/24/15 0522  WBC  --  12.2* 6.6 6.1  HGB  --  9.0* 7.5* 8.9*  PLT  --  121* 94* 89*  CREATININE 1.22 1.10  --  1.70*   Estimated Creatinine Clearance: 44.1 mL/min (by C-G formula based on Cr of 1.7). No results for input(s): VANCOTROUGH, VANCOPEAK, VANCORANDOM, GENTTROUGH, GENTPEAK, GENTRANDOM, TOBRATROUGH, TOBRAPEAK, TOBRARND, AMIKACINPEAK, AMIKACINTROU, AMIKACIN in the last 72 hours.   Microbiology: Recent Results (from the past 720 hour(s))  Culture, blood (routine x 2)     Status: None   Collection Time: 05/04/15  2:50 AM  Result Value Ref Range Status   Specimen Description BLOOD RIGHT ARM  Final   Special Requests BOTTLES DRAWN AEROBIC AND ANAEROBIC [place]  Final   Culture   Final    NO GROWTH 5 DAYS Performed at [facility]    Report Status 05/09/2015 FINAL  Final  Culture, blood (routine x 2)     Status: None   Collection Time: 05/04/15  2:55 AM  Result Value Ref Range Status   Specimen Description BLOOD RIGHT HAND  Final   Special Requests BOTTLES DRAWN AEROBIC ONLY 10CC  Final   Culture   Final    NO GROWTH 5 DAYS Performed at [facility]    Report Status 05/09/2015 FINAL  Final  Culture, Urine     Status: None   Collection Time: 05/04/15 10:57 PM  Result Value Ref Range Status   Specimen Description  URINE, RANDOM  Final   Special Requests NONE  Final   Culture   Final    NO GROWTH 1 DAY Performed at [facility]    Report Status 05/06/2015 FINAL  Final  Culture, Urine     Status: None   Collection Time: 05/11/15  4:21 PM  Result Value Ref Range Status   Specimen Description URINE, CLEAN CATCH  Final   Special Requests Normal  Final   Culture   Final    NO GROWTH 2 DAYS Performed at [facility]    Report Status 05/13/2015 FINAL  Final  Tissue culture     Status: None   Collection Time: 05/12/15  9:50 AM  Result Value Ref Range Status   Specimen Description BIOPSY BONE MARROW  Final   Special Requests NONE  Final   Gram Stain   Final    MODERATE WBC PRESENT,BOTH PMN AND MONONUCLEAR NO SQUAMOUS EPITHELIAL CELLS SEEN NO ORGANISMS SEEN Performed at [facility]    Culture   Final    FEW ESCHERICHIA COLI Performed at [facility]    Report Status 05/14/2015 FINAL  Final   Organism ID, Bacteria ESCHERICHIA COLI  Final      Susceptibility   Escherichia coli - MIC*    AMPICILLIN >=32 RESISTANT Resistant     AMPICILLIN/SULBACTAM >=32 RESISTANT Resistant     CEFAZOLIN >=64 RESISTANT  Resistant     CEFEPIME <=1 SENSITIVE Sensitive     CEFTAZIDIME <=1 SENSITIVE Sensitive     CEFTRIAXONE <=1 SENSITIVE Sensitive     CIPROFLOXACIN <=0.25 SENSITIVE Sensitive     GENTAMICIN <=1 SENSITIVE Sensitive     IMIPENEM <=0.25 SENSITIVE Sensitive     PIP/TAZO 8 SENSITIVE Sensitive     TOBRAMYCIN <=1 SENSITIVE Sensitive     TRIMETH/SULFA Value in next row Sensitive      <=20 SENSITIVE(NOTE)    * FEW ESCHERICHIA COLI  Fungus culture w smear     Status: None (Preliminary result)   Collection Time: 05/12/15  9:50 AM  Result Value Ref Range Status   Specimen Description BIOPSY BONE MARROW  Final   Special Requests NONE  Final   Fungal Smear   Final    NO YEAST OR FUNGAL ELEMENTS SEEN Performed at [facility]    Culture   Final    CULTURE IN  PROGRESS FOR FOUR WEEKS Performed at [facility]    Report Status PENDING  Incomplete  AFB culture with smear     Status: None (Preliminary result)   Collection Time: 05/12/15  9:50 AM  Result Value Ref Range Status   Specimen Description BIOPSY BONE MARROW  Final   Special Requests NONE  Final   Acid Fast Smear   Final    NO ACID FAST BACILLI SEEN Performed at [facility]    Culture   Final    CULTURE WILL BE EXAMINED FOR 6 WEEKS BEFORE ISSUING A FINAL REPORT Performed at [facility]    Report Status PENDING  Incomplete  MRSA PCR Screening     Status: None   Collection Time: 05/13/15  2:59 PM  Result Value Ref Range Status   MRSA by PCR NEGATIVE NEGATIVE Final    Comment:        The GeneXpert MRSA Assay (FDA approved for NASAL specimens only), is one component of a comprehensive MRSA colonization surveillance program. It is not intended to diagnose MRSA infection nor to guide or monitor treatment for MRSA infections.   Culture, blood (x 2)     Status: None   Collection Time: 05/13/15  4:05 PM  Result Value Ref Range Status   Specimen Description BLOOD RIGHT ARM  Final   Special Requests BOTTLES DRAWN AEROBIC AND ANAEROBIC  5CC  Final   Culture  Setup Time   Final    GRAM NEGATIVE RODS AEROBIC BOTTLE ONLY CRITICAL RESULT CALLED TO, READ BACK BY AND VERIFIED WITH: L FREI,RN AT [id number] 05/14/15 BY L BENFIELD    Culture   Final    ENTEROBACTER CLOACAE Performed at [facility]    Report Status 05/16/2015 FINAL  Final   Organism ID, Bacteria ENTEROBACTER CLOACAE  Final      Susceptibility   Enterobacter cloacae - MIC*    CEFAZOLIN >=64 RESISTANT Resistant     CEFEPIME <=1 SENSITIVE Sensitive     CEFTAZIDIME <=1 SENSITIVE Sensitive     CEFTRIAXONE <=1 SENSITIVE Sensitive     CIPROFLOXACIN <=0.25 SENSITIVE Sensitive     GENTAMICIN <=1 SENSITIVE Sensitive     IMIPENEM 1 SENSITIVE Sensitive     TRIMETH/SULFA <=20 SENSITIVE Sensitive      PIP/TAZO <=4 SENSITIVE Sensitive     * ENTEROBACTER CLOACAE  Culture, blood (x 2)     Status: None   Collection Time: 05/13/15  4:24 PM  Result Value Ref Range Status   Specimen  Description BLOOD RIGHT ARM  Final   Special Requests BOTTLES DRAWN AEROBIC AND ANAEROBIC  5CC  Final   Culture  Setup Time   Final    GRAM NEGATIVE RODS IN BOTH AEROBIC AND ANAEROBIC BOTTLES CRITICAL RESULT CALLED TO, READ BACK BY AND VERIFIED WITH: S TROTS @0600  05/14/15 MKELLY    Culture   Final    ENTEROBACTER CLOACAE SUSCEPTIBILITIES PERFORMED ON PREVIOUS CULTURE WITHIN THE LAST 5 DAYS. Performed at [facility]    Report Status 05/16/2015 FINAL  Final  Ova and parasite examination     Status: None   Collection Time: 05/14/15  4:25 PM  Result Value Ref Range Status   Specimen Description STOOL  Final   Special Requests NONE  Final   Ova and parasites   Final    NO OVA OR PARASITES SEEN Performed at [facility]    Report Status 05/15/2015 FINAL  Final  Stool culture     Status: None   Collection Time: 05/14/15  4:25 PM  Result Value Ref Range Status   Specimen Description STOOL  Final   Special Requests NONE  Final   Culture   Final    NO SALMONELLA, SHIGELLA, CAMPYLOBACTER, YERSINIA, OR E.COLI 0157:H7 ISOLATED Performed at [facility]    Report Status 05/18/2015 FINAL  Final  Ova and parasite examination     Status: None   Collection Time: 05/18/15 12:30 PM  Result Value Ref Range Status   Specimen Description BRONCHIAL ALVEOLAR LAVAGE  Final   Special Requests Normal  Final   Ova and parasites   Final    NO OVA OR PARASITES SEEN Performed at [facility]    Report Status 05/21/2015 FINAL  Final  Culture, respiratory (NON-Expectorated)     Status: None   Collection Time: 05/18/15 12:30 PM  Result Value Ref Range Status   Specimen Description BRONCHIAL ALVEOLAR LAVAGE  Final   Special Requests Normal  Final   Gram Stain   Final    ABUNDANT WBC  PRESENT, PREDOMINANTLY PMN NO SQUAMOUS EPITHELIAL CELLS SEEN NO ORGANISMS SEEN Performed at [facility]    Culture   Final    RARE ESCHERICHIA COLI Performed at [facility]    Report Status 05/21/2015 FINAL  Final   Organism ID, Bacteria ESCHERICHIA COLI  Final      Susceptibility   Escherichia coli - MIC*    AMPICILLIN >=32 RESISTANT Resistant     AMPICILLIN/SULBACTAM >=32 RESISTANT Resistant     CEFEPIME <=1 SENSITIVE Sensitive     CEFTAZIDIME <=1 SENSITIVE Sensitive     CEFTRIAXONE <=1 SENSITIVE Sensitive     CIPROFLOXACIN 0.5 SENSITIVE Sensitive     GENTAMICIN >=16 RESISTANT Resistant     IMIPENEM <=0.25 SENSITIVE Sensitive     PIP/TAZO <=4 SENSITIVE Sensitive     TOBRAMYCIN 8 INTERMEDIATE Intermediate     TRIMETH/SULFA Value in next row Sensitive      <=20 SENSITIVE(NOTE)    * RARE ESCHERICHIA COLI  Fungus Culture with Smear     Status: None (Preliminary result)   Collection Time: 05/18/15 12:30 PM  Result Value Ref Range Status   Specimen Description BRONCHIAL ALVEOLAR LAVAGE  Final   Special Requests Normal  Final   Fungal Smear   Final    NO YEAST OR FUNGAL ELEMENTS SEEN Performed at [facility]    Culture   Final    CULTURE IN PROGRESS FOR FOUR WEEKS Performed at [facility]    Report Status PENDING  Incomplete  AFB culture with smear     Status: None (Preliminary result)   Collection Time: 05/18/15 12:30 PM  Result Value Ref Range Status   Specimen Description BRONCHIAL ALVEOLAR LAVAGE  Final   Special Requests Normal  Final   Acid Fast Smear   Final    NO ACID FAST BACILLI SEEN Performed at [facility]    Culture   Final    CULTURE WILL BE EXAMINED FOR 6 WEEKS BEFORE ISSUING A FINAL REPORT Performed at [facility]    Report Status PENDING  Incomplete    Assessment: 70 yoM on IV antibiotics for sepsis d/t cellulitis, enterobacter bacteremia, and E.Coli in bone marrow & sputum cultures.  He has  transitioned to IHD - last HD 11/5.  Currently on Rocephin (antibiotic day #7)  10/16 >> Clinda >> 10/16 10/17 >>Vanc >> 10/21, 10/26 >> 10/27 10/17 >> Zosyn >> 10/21, 10/26 >> 10/27 10/27 >> Primaxin >> 10/31 10/31 >> Rocephin >>  10/17 bloodx2: NGF 10/17 urine: NGF 10/24 urine: NGF   10/25 AFB: NGTD 10/25 Fungus: NGTD 10/25 BM biopsy: Few E.Coli (R amp, unasyn, cefazolin, S cefepime, ceftaz, CTX, cipro, gent, primaxin, zosyn, tobra, bactrim) 10/26 blood: 2/2 enterobacter cloacae (R cefazoline, S cefepime, ceftaz, CTX, cipro, gent, primaxin, zosyn, bactrim) 10/27 O/P: negative 10/27 stool culture: no suspicious colonies, continuing to hold 10/29 blood 2/2: canceled - per lab, couldn't collect blood 10/31 BAL: Ecoli   Goal of Therapy:  Eradication of infection  Plan:  Continue Rocephin 2g IV q24h. Follow up renal fxn, culture results, and clinical course. Follow-up ID recommendations for length of therapy. Hgb 8.9 s/p transfusion 11/5.  Would they benefit from Aranesp?  Check Fe panel?  [employer], Pharm.D., BCPS Clinical Pharmacist Pager [phone] 05/24/2015 12:50 PM

## 2015-05-24 NOTE — Progress Notes (Signed)
[facility] Progress Note   Subjective: feeling stronger, eating lunch  Filed Vitals:   05/23/15 1722 05/23/15 1955 05/24/15 0442 05/24/15 0746  BP: 122/63 100/64 105/72 113/66  Pulse: 109 69 71 62  Temp: 98.3 F (36.8 C) 98.1 F (36.7 C) 98.3 F (36.8 C) 98.5 F (36.9 C)  TempSrc: Oral Oral Oral Oral  Resp: 19 20 18 17   Height:      Weight:  72.5 kg (159 lb 13.3 oz)    SpO2: 100% 100% 100% 100%   Exam: Sitting up in bed , eating lunch No jvd Chest faint basilar rales bilat RRR no MRG Abd soft dec'd BS no gross ascites GU dark amber urine 1-2+ edema LE's/ UE's Neuro alert, nonfocal  UA 10/26 - 100 prot, 3-6 wbc, 7-10 rbc UNa 19 UCr 236 Urine M-spike is negative CXR 10/29 no active disease   Assessment: 1 AKI on CKD3 - due to sepsis/ shock / ATN. S/P CRRT thru 11/4 then regular HD 11/5.  Making urine 600 cc/day now. Creat 1.7 today (baseline 1.4- 1.8). Plan hold dialysis and watch for now. 2 CKD stage III, baseline creat 1.4- 1.8 3 ID on abx for GNR in blood and different GNR in bone marrow cx's- on Rocephin 4 Shock - resovled 5 ^LFT's supportive care 6 Hx dilated CM EF 25% 7 Vol excess - improving  Plan - cont supportive care, labs in am   Kelly Splinter MD [place] pager [phone]    cell [phone] 05/24/2015, 1:31 PM    Recent Labs Lab 05/21/15 0420 05/21/15 1611 05/22/15 0501 05/24/15 0522  NA 138 137 137 137  K 4.1 4.3 4.6 4.4  CL 105 103 104 101  CO2 28 28 29 28   GLUCOSE 96 112* 107* 85  BUN 17 17 17 18   CREATININE 1.27* 1.22 1.10 1.70*  CALCIUM 8.8* 8.6* 8.6* 8.6*  PHOS 3.1 3.0 2.9  --     Recent Labs Lab 05/18/15 0415  05/20/15 0515  05/21/15 1611 05/22/15 0501 05/24/15 0522  AST 386*  --  222*  --   --   --  93*  ALT 262*  --  178*  --   --   --  115*  ALKPHOS 186*  --  236*  --   --   --  178*  BILITOT 12.6*  --  10.4*  --   --   --  5.2*  PROT 5.6*  --  5.7*  --   --   --  5.3*  ALBUMIN 1.8*   1.8*  < > 1.8*  1.9*  < > 2.3* 2.3* 1.8*  < > = values in this interval not displayed.  Recent Labs Lab 05/22/15 0501 05/23/15 0505 05/24/15 0522  WBC 12.2* 6.6 6.1  NEUTROABS 8.8* 4.4 4.2  HGB 9.0* 7.5* 8.9*  HCT 27.5* 23.0* 27.1*  MCV 110.4* 108.0* 104.6*  PLT 121* 94* 89*   . sodium chloride   Intravenous Once  . aspirin  81 mg Oral Daily  . carvedilol  3.125 mg Oral BID WC  . cefTRIAXone (ROCEPHIN)  IV  2 g Intravenous Q24H  . feeding supplement (GLUCERNA SHAKE)  237 mL Oral BID BM  . feeding supplement (PRO-STAT SUGAR FREE 64)  30 mL Oral q morning - 10a  . hydrocerin   Topical TID  . hydrocortisone  10 mg Oral BID  . insulin aspart  0-9 Units Subcutaneous TID WC  . sodium chloride  3  mL Intravenous Q12H     acetaminophen **OR** [DISCONTINUED] acetaminophen, ondansetron **OR** ondansetron (ZOFRAN) IV, sodium chloride

## 2015-05-25 LAB — CBC WITH DIFFERENTIAL/PLATELET
Basophils Absolute: 0 10*3/uL (ref 0.0–0.1)
Basophils Relative: 0 %
EOS ABS: 0.3 10*3/uL (ref 0.0–0.7)
Eosinophils Relative: 5 %
HCT: 25.4 % — ABNORMAL LOW (ref 39.0–52.0)
Hemoglobin: 8.5 g/dL — ABNORMAL LOW (ref 13.0–17.0)
LYMPHS PCT: 23 %
Lymphs Abs: 1.2 10*3/uL (ref 0.7–4.0)
MCH: 35.4 pg — AB (ref 26.0–34.0)
MCHC: 33.5 g/dL (ref 30.0–36.0)
MCV: 105.8 fL — AB (ref 78.0–100.0)
MONOS PCT: 10 %
Monocytes Absolute: 0.5 10*3/uL (ref 0.1–1.0)
Neutro Abs: 3.3 10*3/uL (ref 1.7–7.7)
Neutrophils Relative %: 62 %
PLATELETS: 87 10*3/uL — AB (ref 150–400)
RBC: 2.4 MIL/uL — ABNORMAL LOW (ref 4.22–5.81)
RDW: 22.3 % — ABNORMAL HIGH (ref 11.5–15.5)
WBC: 5.3 10*3/uL (ref 4.0–10.5)

## 2015-05-25 LAB — BASIC METABOLIC PANEL
Anion gap: 6 (ref 5–15)
BUN: 26 mg/dL — AB (ref 6–20)
CO2: 27 mmol/L (ref 22–32)
Calcium: 8.2 mg/dL — ABNORMAL LOW (ref 8.9–10.3)
Chloride: 102 mmol/L (ref 101–111)
Creatinine, Ser: 1.76 mg/dL — ABNORMAL HIGH (ref 0.61–1.24)
GFR calc Af Amer: 46 mL/min — ABNORMAL LOW (ref >60)
GFR, EST NON AFRICAN AMERICAN: 40 mL/min — AB (ref >60)
GLUCOSE: 92 mg/dL (ref 65–99)
POTASSIUM: 4.1 mmol/L (ref 3.5–5.1)
Sodium: 135 mmol/L (ref 135–145)

## 2015-05-25 LAB — GLUCOSE, CAPILLARY
GLUCOSE-CAPILLARY: 97 mg/dL (ref 65–99)
Glucose-Capillary: 70 mg/dL (ref 65–99)
Glucose-Capillary: 107 mg/dL — ABNORMAL HIGH (ref 65–99)
Glucose-Capillary: 101 mg/dL — ABNORMAL HIGH (ref 65–99)

## 2015-05-25 LAB — OVA AND PARASITE EXAMINATION

## 2015-05-25 NOTE — Evaluation (Signed)
Physical Therapy Evaluation Patient Details Name: [name] MRN: [id number] DOB: 1955/12/17 Today's Date: 05/25/2015   History of Present Illness  59 yo male with onset of sepsis with intubation 05/05/15 was referred to PT after self-extubation 11/2.  Has EF20%, homeless, shock, leukocytosis, tachycardia, metabolic acidosis  Clinical Impression  Pt is able to follow instructions for PT but will need to be sent home alone given his situation.  Treatment will need to focus on safety and independence as he will not have help and will need to be as free of an AD as possible.  Clearing up of his cognition should help achieve the reduction of gait assistance.    Follow Up Recommendations No PT follow up (will not have access to services)    Equipment Recommendations  None recommended by PT (await discharge needs)    Recommendations for Other Services       Precautions / Restrictions Precautions Precautions: Fall (telemetry) Precaution Comments: pt resists attempts to keep him on monitors Restrictions Weight Bearing Restrictions: No      Mobility  Bed Mobility Overal bed mobility: Needs Assistance Bed Mobility: Supine to Sit     Supine to sit: Min guard;Min assist     General bed mobility comments: Pt is willing to help get OOB and sits comfortably bedside  Transfers Overall transfer level: Modified independent Equipment used: Rolling walker (2 wheeled) (for safety due to pt confusion)             General transfer comment: pt used hands on bed to stand  Ambulation/Gait Ambulation/Gait assistance: Min guard;Min assist Ambulation Distance (Feet): 35 Feet Assistive device: Rolling walker (2 wheeled) Gait Pattern/deviations: Step-to pattern;Decreased dorsiflexion - right;Decreased dorsiflexion - left;Wide base of support;Trunk flexed;Drifts right/left (cannot follow instructions for direction without repetition) Gait velocity: reduced Gait velocity interpretation:  Below normal speed for age/gender General Gait Details: drifting on walker with PT directing for safety and to avoid stress on lines, which pt does not attend to at all  Stairs            Wheelchair Mobility    Modified Rankin (Stroke Patients Only)       Balance Overall balance assessment: Needs assistance Sitting-balance support: Feet supported Sitting balance-Leahy Scale: Good     Standing balance support: Bilateral upper extremity supported Standing balance-Leahy Scale: Fair                               Pertinent Vitals/Pain Pain Assessment: No/denies pain    Home Living Family/patient expects to be discharged to:: Other (Comment) (hotel)                      Prior Function Level of Independence: Independent         Comments: has no equipment     Hand Dominance        Extremity/Trunk Assessment   Upper Extremity Assessment: Overall WFL for tasks assessed           Lower Extremity Assessment: Generalized weakness      Cervical / Trunk Assessment: Normal  Communication   Communication: No difficulties (confused)  Cognition Arousal/Alertness: Awake/alert Behavior During Therapy: Impulsive Overall Cognitive Status: Impaired/Different from baseline Area of Impairment: Memory;Following commands;Safety/judgement;Awareness;Problem solving;Orientation;Attention Orientation Level: Disoriented to;Place;Time;Situation Current Attention Level: Divided;Alternating Memory: Decreased recall of precautions;Decreased short-term memory Following Commands: Follows one step commands inconsistently Safety/Judgement: Decreased awareness of safety;Decreased awareness of deficits Awareness: Intellectual  Problem Solving: Requires verbal cues;Difficulty sequencing      General Comments General comments (skin integrity, edema, etc.): Pt is having a diffiicult time staying on task and will need to go back to a very independent environment.  He  is unsafe to walk alone mainly over his confusion and following directions, and will focus rehab on safety and control of walker    Exercises        Assessment/Plan    PT Assessment Patient needs continued PT services  PT Diagnosis Abnormality of gait;Altered mental status   PT Problem List Decreased strength;Decreased range of motion;Decreased activity tolerance;Decreased balance;Decreased mobility;Decreased coordination;Decreased cognition;Decreased knowledge of use of DME;Decreased safety awareness;Decreased knowledge of precautions;Cardiopulmonary status limiting activity;Decreased skin integrity  PT Treatment Interventions DME instruction;Gait training;Functional mobility training;Therapeutic activities;Therapeutic exercise;Balance training;Neuromuscular re-education;Cognitive remediation;Patient/family education   PT Goals (Current goals can be found in the Care Plan section) Acute Rehab PT Goals Patient Stated Goal: none stated PT Goal Formulation: Patient unable to participate in goal setting Time For Goal Achievement: 06/08/15 Potential to Achieve Goals: Good    Frequency Min 3X/week   Barriers to discharge Decreased caregiver support home in hotel and alone    Co-evaluation               End of Session   Activity Tolerance: Patient tolerated treatment well;Other (comment) (reduced safety awareness) Patient left: in chair;with call bell/phone within reach;with chair alarm set Nurse Communication: Mobility status         Time: [id number] PT Time Calculation (min) (ACUTE ONLY): 29 min   Charges:   PT Evaluation $Initial PT Evaluation Tier I: 1 Procedure PT Treatments $Gait Training: 8-22 mins   PT G CodesRamond Dial June 06, 2015, 11:41 AM   Mee Hives, PT MS Acute Rehab Dept. Number: [facility] [id number] and [place] [phone]

## 2015-05-25 NOTE — Progress Notes (Signed)
[facility] PROGRESS NOTE  [name] [id number] DOB: 01-21-1956 DOA: 05/03/2015 PCP: Charolette Forward, MD  Brief narrative 59 year old homeless male with history of hypertension, and ST MI, arthritis, anxiety/depression, history of processing abuse with cardiomyopathy (EF of 20%), chronic kidney disease stage IV, hyperlipidemia, history of noncompliance presented to [facility] on 10/16 with neck/facial swelling with hives. He also had intermittent rash and fever. He had transaminitis (AST 299, ALT of 283 and total bili of 3.9) CT of the neck showed asymmetric enlargement of the right second mandibular gland which was nonspecific. He had significant eosinophilia on his white count. Patient was placed on empiric clindamycin and then transitioned to broad-spectrum coverage with vancomycin and Zosyn. Patient's blood cultures was also growing Enterobacter. ID consulted.  a bone marrow biopsy was done to evaluate  eosinophilia and autoimmune workup sent. On 10/26 he went into respiratory distress with septic shock and transferred to ICU requiring intubation...  He also developed shock liver with oliguric renal failure requiring CVVH. Vision self extubated on 11/2. Patient now showing improved urinary output and transferred to [facility] for intermittent dialysis.  SIGNIFICANT EVENTS  10/13 Seen in ER for N/V, sore throat, diarrhea & rash 10/16 Admit to [facility] for facial swelling, cough with frothy yellow sputum, sore throat and hives. 10/26 bone marrow bx done as part of evaluation for eosinophilia. Developed worsening distress later that afternoon. intubated 10/27 cvvh started 10/28-10/31: Bone marrow bx from 26th= ecoli, BCx2 showing Enterobacter. ID following. By 10/30 pressors weaning. Still on CRRT. 10/31 attempting weaning efforts.  10/31: BAL to eval to help explain: unexplained syndrome characterized by fever, hypereosinophilia with high IgE. Primaxin changed to Rocephin by  ID 11/1: sedated. No spont effort during weaning. Fentanyl gtt stopped.  11/2 self extubated 11/4 transferred to [facility] the hospitalist service for need of intermittent dialysis. 11/5: underwent HD 11/6-7 ; good urine output. Holding off further dialysis.   Assessment/Plan: Acute hypoxic respiratory failure Possibly associated with sepsis. Bronchoscopy showing tracheobronchitis. Biopsy growing Escherichia coli. Intubated from 10/26-11/2. Currently stable. Bedside incentive spirometry.  Septic shock with shock liver and oliguric renal failure Sepsis currently resolved. LFTs improving.  Urine output improving. (12 50 mL past 24 hours) Baseline creatinine of 1.4-1.8. Creatinine of 1.7 today. Renal following closely. Discontinue Foley.  Septic shock due to Enterobacter. Required pressors and intubation along with CVVHD. Source unclear. Bronchial secretion on BAL use sensitive Escherichia coli. Also bone marrow biopsy showing Escherichia coli without clear source. Continue empiric Rocephin. Duration of antibiotic per ID.  Escherichia coli on bone marrow biopsy No clear source. Unclear if this is contaminant. ID suggesting a possible need for colonoscopy.  Eosinophilia No clear etiology. Patient on steroid and eosinophilia has resolved. Weaning steroids. Ova and parasite from bronchial lavage negative.  Dilated cardiomyopathy EF of 25-30% with diffuse hypokinesis. Severely reduced biventricular function. Continue aspirin, Coreg, Plavix.  Severe protein -calorie malnutrition Continue supplemental.  Thrombocytopenia Drop in platelets progressively. Stopped pepcid and plavix. Will monitor.   Anemia  possibly of chronic disease.  Diet: renal/ diabetic   Code Status: Full code Family Communication: None at bedside Disposition Plan: Continue inpatient and monitor of dialysis 48 hours. No recommendations per PT.   Consultants:  Renal  PC CM  ID  Procedures:  10/13  RUQ [place] - mild gallbladder wall thickening, no gallstones observed 10/16 CXR - no acute infiltrate 10/16 CT Soft tissue Neck - asymmetric enlargement of R submandibular gland relative to the left is  non-specific but could be due to infection, inflammation or tumor. Negative for abscess, diffuse subcutaneous edema 10/17 MRI Soft Tissue Neck - motion degraded study, no discrete mass, grossly similar subcutaneous edema involving the bilateral submandibular spaces (nonspecific but must consider infection/cellulitis), no discrete abscess  10/21 CXR - no acute infiltrate 10/21 CT Abd/Pelvis - diffuse body wall edema suggestive of anasarca, mesenteric edema but no overt ascites, no acute intra-abdominal abnormalities, stable prostate gland enlargement and small hiatal hernia 10/26 TTE - LV & RV moderately dilated. EF 25-30% w/ diffuse hypokinesis of LV. Moderate MR. No pericardial effusion. 10/26 Renal US - Medical renal disease. Right pleural effusion. No hydronephrosis. 10/27 CT chest, abd, pelvis - Bibasilar atelectasis/ early infiltrate, anasarca. 10/29 RUQ [place] - no stones. No perichole fluid. CBD 29m. No focal liver lesion. Right pleural effusion. 10/31 bronchoscopy > thick secretions, BAL sent  Antibiotics:  Ceftriaxone 10/31--  Vancomycin 10/16-20, 26-27  Primaxin 10/27-10/31  Zosyn 10/17, 10/21, 10/26-10/27  Clindamycin 10/16-10/17  HPI/Subjective: Seen and examined. No Overnight issues  Objective: Filed Vitals:   05/25/15 0722  BP: 106/69  Pulse: 77  Temp: 97.7 F (36.5 C)  Resp: 18    Intake/Output Summary (Last 24 hours) at 05/25/15 1335 Last data filed at 05/25/15 1311  Gross per 24 hour  Intake    840 ml  Output   1400 ml  Net   -560 ml   Filed Weights   05/23/15 1612 05/23/15 1955 05/24/15 1948  Weight: 72.2 kg (159 lb 2.8 oz) 72.5 kg (159 lb 13.3 oz) 72.6 kg (160 lb 0.9 oz)    Exam:   General:   no acute distress  HEENT:Pallor+ Temporal wasting,  icteric ,moist oral mucosa, rt IJ  Chest: Clear to auscultation bilaterally  CVS: Normal S1 and S2, no murmurs rub or gallop  GI: Soft, nondistended, nontender, bowel sounds present, Foley with dark urine  Musculoskeletal : Warm, no edema    Data Reviewed: Basic Metabolic Panel:  Recent Labs Lab 05/19/15 0449  05/20/15 0515 05/20/15 1610 05/21/15 0420 05/21/15 1611 05/22/15 0501 05/24/15 0522 05/25/15 0410  NA 138  < > 138 137 138 137 137 137 135  K 3.7  < > 3.8 3.7 4.1 4.3 4.6 4.4 4.1  CL 105  < > 107 102 105 103 104 101 102  CO2 30  < > '28 28 28 28 29 28 27  ' GLUCOSE 108*  < > 134* 111* 96 112* 107* 85 92  BUN 19  < > '19 17 17 17 17 18 ' 26*  CREATININE 1.26*  < > 1.25* 1.13 1.27* 1.22 1.10 1.70* 1.76*  CALCIUM 7.9*  < > 8.1* 8.4* 8.8* 8.6* 8.6* 8.6* 8.2*  MG 2.4  --  2.3  --  2.3  --  2.2  --   --   PHOS 2.9  < > 2.8 2.6 3.1 3.0 2.9  --   --   < > = values in this interval not displayed. Liver Function Tests:  Recent Labs Lab 05/20/15 0515 05/20/15 1610 05/21/15 0420 05/21/15 1611 05/22/15 0501 05/24/15 0522  AST 222*  --   --   --   --  93*  ALT 178*  --   --   --   --  115*  ALKPHOS 236*  --   --   --   --  178*  BILITOT 10.4*  --   --   --   --  5.2*  PROT 5.7*  --   --   --   --  5.3*  ALBUMIN 1.8*  1.9* 2.1* 2.3* 2.3* 2.3* 1.8*   No results for input(s): LIPASE, AMYLASE in the last 168 hours. No results for input(s): AMMONIA in the last 168 hours. CBC:  Recent Labs Lab 05/21/15 0420 05/22/15 0501 05/23/15 0505 05/24/15 0522 05/25/15 0410  WBC 16.3* 12.2* 6.6 6.1 5.3  NEUTROABS 11.9* 8.8* 4.4 4.2 3.3  HGB 8.5* 9.0* 7.5* 8.9* 8.5*  HCT 25.2* 27.5* 23.0* 27.1* 25.4*  MCV 109.1* 110.4* 108.0* 104.6* 105.8*  PLT 126* 121* 94* 89* 87*   Cardiac Enzymes: No results for input(s): CKTOTAL, CKMB, CKMBINDEX, TROPONINI in the last 168 hours. BNP (last 3 results)  Recent Labs  04/30/15 1459 05/03/15 1009 05/07/15 1439  BNP 1557.1* 752.1*  1402.2*    ProBNP (last 3 results)  Recent Labs  04/13/15 1012  PROBNP 3040.00*    CBG:  Recent Labs Lab 05/24/15 1136 05/24/15 1624 05/24/15 1942 05/25/15 0720 05/25/15 1136  GLUCAP 118* 99 115* 70 107*    Recent Results (from the past 240 hour(s))  Ova and parasite examination     Status: None   Collection Time: 05/18/15 12:30 PM  Result Value Ref Range Status   Specimen Description BRONCHIAL ALVEOLAR LAVAGE  Final   Special Requests Normal  Final   Ova and parasites   Final    NO OVA OR PARASITES SEEN Performed at [facility]    Report Status 05/21/2015 FINAL  Final  Culture, respiratory (NON-Expectorated)     Status: None   Collection Time: 05/18/15 12:30 PM  Result Value Ref Range Status   Specimen Description BRONCHIAL ALVEOLAR LAVAGE  Final   Special Requests Normal  Final   Gram Stain   Final    ABUNDANT WBC PRESENT, PREDOMINANTLY PMN NO SQUAMOUS EPITHELIAL CELLS SEEN NO ORGANISMS SEEN Performed at [facility]    Culture   Final    RARE ESCHERICHIA COLI Performed at [facility]    Report Status 05/21/2015 FINAL  Final   Organism ID, Bacteria ESCHERICHIA COLI  Final      Susceptibility   Escherichia coli - MIC*    AMPICILLIN >=32 RESISTANT Resistant     AMPICILLIN/SULBACTAM >=32 RESISTANT Resistant     CEFEPIME <=1 SENSITIVE Sensitive     CEFTAZIDIME <=1 SENSITIVE Sensitive     CEFTRIAXONE <=1 SENSITIVE Sensitive     CIPROFLOXACIN 0.5 SENSITIVE Sensitive     GENTAMICIN >=16 RESISTANT Resistant     IMIPENEM <=0.25 SENSITIVE Sensitive     PIP/TAZO <=4 SENSITIVE Sensitive     TOBRAMYCIN 8 INTERMEDIATE Intermediate     TRIMETH/SULFA Value in next row Sensitive      <=20 SENSITIVE(NOTE)    * RARE ESCHERICHIA COLI  Fungus Culture with Smear     Status: None (Preliminary result)   Collection Time: 05/18/15 12:30 PM  Result Value Ref Range Status   Specimen Description BRONCHIAL ALVEOLAR LAVAGE  Final   Special Requests  Normal  Final   Fungal Smear   Final    NO YEAST OR FUNGAL ELEMENTS SEEN Performed at [facility]    Culture   Final    CULTURE IN PROGRESS FOR FOUR WEEKS Performed at [facility]    Report Status PENDING  Incomplete  AFB culture with smear     Status: None (Preliminary result)   Collection Time: 05/18/15 12:30 PM  Result Value Ref Range Status   Specimen Description BRONCHIAL ALVEOLAR LAVAGE  Final   Special Requests Normal  Final   Acid Fast Smear   Final    NO ACID FAST BACILLI SEEN Performed at [facility]    Culture   Final    CULTURE WILL BE EXAMINED FOR 6 WEEKS BEFORE ISSUING A FINAL REPORT Performed at [facility]    Report Status PENDING  Incomplete  Ova and parasite examination     Status: None   Collection Time: 05/21/15 11:31 AM  Result Value Ref Range Status   Specimen Description SPUTUM  Final   Ova and parasites   Final    NO OVA OR PARASITES SEEN MODERATE WBC Performed at [facility]    Report Status 05/25/2015 FINAL  Final     Studies: No results found.  Scheduled Meds: . sodium chloride   Intravenous Once  . aspirin  81 mg Oral Daily  . carvedilol  3.125 mg Oral BID WC  . cefTRIAXone (ROCEPHIN)  IV  2 g Intravenous Q24H  . feeding supplement (GLUCERNA SHAKE)  237 mL Oral BID BM  . feeding supplement (PRO-STAT SUGAR FREE 64)  30 mL Oral q morning - 10a  . hydrocerin   Topical TID  . hydrocortisone  10 mg Oral BID  . insulin aspart  0-9 Units Subcutaneous TID WC  . sodium chloride  3 mL Intravenous Q12H   Continuous Infusions:     Time spent: 25 minutes    Yolanda Huffstetler, [place]  [facility] Pager [phone]. If 7PM-7AM, please contact night-coverage at [email or web address], password [facility] 05/25/2015, 1:35 PM  LOS: 21 days

## 2015-05-25 NOTE — Progress Notes (Signed)
[facility] Progress Note   Subjective: Good UOP- creatinine stable- sitting up in chair  Filed Vitals:   05/24/15 1524 05/24/15 1948 05/25/15 0411 05/25/15 0722  BP: 108/64 111/68 110/70 106/69  Pulse: 80 75 81 77  Temp: 99.3 F (37.4 C) 98.5 F (36.9 C) 98.1 F (36.7 C) 97.7 F (36.5 C)  TempSrc: Oral Oral Oral Oral  Resp: 18 19 20 18   Height:      Weight:  72.6 kg (160 lb 0.9 oz)    SpO2: 100% 100% 96% 100%   Exam: Sitting up in bed , eating lunch No jvd Chest faint basilar rales bilat RRR no MRG Abd soft dec'd BS no gross ascites GU dark amber urine 1-2+ edema LE's/ UE's Neuro alert, nonfocal  UA 10/26 - 100 prot, 3-6 wbc, 7-10 rbc UNa 19 UCr 236 Urine M-spike is negative CXR 10/29 no active disease   Assessment: 1 AKI on CKD3 - due to sepsis/ shock / ATN. S/P CRRT 10/27 thru 11/4 then regular HD 11/5.  Making urine 1200 cc/day now. Creat 1.7 today (baseline 1.4- 1.8). Continue to  hold dialysis and watch for now. 2 CKD stage III, baseline creat 1.4- 1.8 3 ID on abx for GNR in blood and different GNR in bone marrow cx's- on Rocephin 4 Shock - resovled 5 ^LFT's supportive care 6 Hx dilated CM EF 25% 7 Vol excess - improving  Plan - cont supportive care, labs in am   Yaiden Yang A   05/25/2015, 11:13 AM    Recent Labs Lab 05/21/15 0420 05/21/15 1611 05/22/15 0501 05/24/15 0522 05/25/15 0410  NA 138 137 137 137 135  K 4.1 4.3 4.6 4.4 4.1  CL 105 103 104 101 102  CO2 28 28 29 28 27   GLUCOSE 96 112* 107* 85 92  BUN 17 17 17 18  26*  CREATININE 1.27* 1.22 1.10 1.70* 1.76*  CALCIUM 8.8* 8.6* 8.6* 8.6* 8.2*  PHOS 3.1 3.0 2.9  --   --     Recent Labs Lab 05/20/15 0515  05/21/15 1611 05/22/15 0501 05/24/15 0522  AST 222*  --   --   --  93*  ALT 178*  --   --   --  115*  ALKPHOS 236*  --   --   --  178*  BILITOT 10.4*  --   --   --  5.2*  PROT 5.7*  --   --   --  5.3*  ALBUMIN 1.8*  1.9*  < > 2.3* 2.3* 1.8*  < > =  values in this interval not displayed.  Recent Labs Lab 05/23/15 0505 05/24/15 0522 05/25/15 0410  WBC 6.6 6.1 5.3  NEUTROABS 4.4 4.2 3.3  HGB 7.5* 8.9* 8.5*  HCT 23.0* 27.1* 25.4*  MCV 108.0* 104.6* 105.8*  PLT 94* 89* 87*   . sodium chloride   Intravenous Once  . aspirin  81 mg Oral Daily  . carvedilol  3.125 mg Oral BID WC  . cefTRIAXone (ROCEPHIN)  IV  2 g Intravenous Q24H  . feeding supplement (GLUCERNA SHAKE)  237 mL Oral BID BM  . feeding supplement (PRO-STAT SUGAR FREE 64)  30 mL Oral q morning - 10a  . hydrocerin   Topical TID  . hydrocortisone  10 mg Oral BID  . insulin aspart  0-9 Units Subcutaneous TID WC  . sodium chloride  3 mL Intravenous Q12H     acetaminophen **OR** [DISCONTINUED] acetaminophen, ondansetron **OR** ondansetron (ZOFRAN) IV, sodium chloride

## 2015-05-25 NOTE — Progress Notes (Signed)
[facility] for Infectious [facility]   Reason for visit: Follow up on bacteremia, eosinophilia  Interval History: he was transferred to [facility] for dialysis.  WBC wnl, remains afebrile.   Physical Exam: Constitutional:  Filed Vitals:   05/25/15 1517  BP: 113/68  Pulse: 83  Temp: 99.2 F (37.3 C)  Resp: 17   patient appears in NAD Eyes: anicteric HENT: no thrush Respiratory: Normal respiratory effort; CTA B Cardiovascular: RRR Skin with same dry, scaley rash on chest  Review of Systems: Constitutional: negative for chills Gastrointestinal: negative for diarrhea  Lab Results  Component Value Date   WBC 5.3 05/25/2015   HGB 8.5* 05/25/2015   HCT 25.4* 05/25/2015   MCV 105.8* 05/25/2015   PLT 87* 05/25/2015    Lab Results  Component Value Date   CREATININE 1.76* 05/25/2015   BUN 26* 05/25/2015   NA 135 05/25/2015   K 4.1 05/25/2015   CL 102 05/25/2015   CO2 27 05/25/2015    Lab Results  Component Value Date   ALT 115* 05/24/2015   AST 93* 05/24/2015   ALKPHOS 178* 05/24/2015     Microbiology: Recent Results (from the past 240 hour(s))  Ova and parasite examination     Status: None   Collection Time: 05/18/15 12:30 PM  Result Value Ref Range Status   Specimen Description BRONCHIAL ALVEOLAR LAVAGE  Final   Special Requests Normal  Final   Ova and parasites   Final    NO OVA OR PARASITES SEEN Performed at [facility]    Report Status 05/21/2015 FINAL  Final  Culture, respiratory (NON-Expectorated)     Status: None   Collection Time: 05/18/15 12:30 PM  Result Value Ref Range Status   Specimen Description BRONCHIAL ALVEOLAR LAVAGE  Final   Special Requests Normal  Final   Gram Stain   Final    ABUNDANT WBC PRESENT, PREDOMINANTLY PMN NO SQUAMOUS EPITHELIAL CELLS SEEN NO ORGANISMS SEEN Performed at [facility]    Culture   Final    RARE ESCHERICHIA COLI Performed at [facility]    Report Status 05/21/2015 FINAL  Final   Organism ID, Bacteria ESCHERICHIA COLI  Final      Susceptibility   Escherichia coli - MIC*    AMPICILLIN >=32 RESISTANT Resistant     AMPICILLIN/SULBACTAM >=32 RESISTANT Resistant     CEFEPIME <=1 SENSITIVE Sensitive     CEFTAZIDIME <=1 SENSITIVE Sensitive     CEFTRIAXONE <=1 SENSITIVE Sensitive     CIPROFLOXACIN 0.5 SENSITIVE Sensitive     GENTAMICIN >=16 RESISTANT Resistant     IMIPENEM <=0.25 SENSITIVE Sensitive     PIP/TAZO <=4 SENSITIVE Sensitive     TOBRAMYCIN 8 INTERMEDIATE Intermediate     TRIMETH/SULFA Value in next row Sensitive      <=20 SENSITIVE(NOTE)    * RARE ESCHERICHIA COLI  Fungus Culture with Smear     Status: None (Preliminary result)   Collection Time: 05/18/15 12:30 PM  Result Value Ref Range Status   Specimen Description BRONCHIAL ALVEOLAR LAVAGE  Final   Special Requests Normal  Final   Fungal Smear   Final    NO YEAST OR FUNGAL ELEMENTS SEEN Performed at [facility]    Culture   Final    CULTURE IN PROGRESS FOR FOUR WEEKS Performed at [facility]    Report Status PENDING  Incomplete  AFB culture with smear     Status: None (Preliminary result)  Collection Time: 05/18/15 12:30 PM  Result Value Ref Range Status   Specimen Description BRONCHIAL ALVEOLAR LAVAGE  Final   Special Requests Normal  Final   Acid Fast Smear   Final    NO ACID FAST BACILLI SEEN Performed at [facility]    Culture   Final    CULTURE WILL BE EXAMINED FOR 6 WEEKS BEFORE ISSUING A FINAL REPORT Performed at [facility]    Report Status PENDING  Incomplete  Ova and parasite examination     Status: None   Collection Time: 05/21/15 11:31 AM  Result Value Ref Range Status   Specimen Description SPUTUM  Final   Ova and parasites   Final    NO OVA OR PARASITES SEEN MODERATE WBC Performed at [facility]    Report Status 05/25/2015 FINAL  Final    Impression:  1. Septic shock due to Enterobacter, resolving 2.  Eosiniophilia,  resolved 3. Renal failure  Plan: 1. Continue with ceftriaxone

## 2015-05-25 NOTE — Progress Notes (Signed)
Pt's foley removed with no complication. Instructed pt to use urinal as needed and to notify RN or NT so output can be measured.

## 2015-05-26 DIAGNOSIS — G9340 Encephalopathy, unspecified: Secondary | ICD-10-CM

## 2015-05-26 DIAGNOSIS — N320 Bladder-neck obstruction: Secondary | ICD-10-CM

## 2015-05-26 LAB — COMPREHENSIVE METABOLIC PANEL
ALBUMIN: 1.8 g/dL — AB (ref 3.5–5.0)
ALT: 86 U/L — ABNORMAL HIGH (ref 17–63)
AST: 76 U/L — AB (ref 15–41)
Alkaline Phosphatase: 177 U/L — ABNORMAL HIGH (ref 38–126)
Anion gap: 8 (ref 5–15)
BILIRUBIN TOTAL: 3.7 mg/dL — AB (ref 0.3–1.2)
BUN: 23 mg/dL — AB (ref 6–20)
CO2: 26 mmol/L (ref 22–32)
Calcium: 8.3 mg/dL — ABNORMAL LOW (ref 8.9–10.3)
Chloride: 101 mmol/L (ref 101–111)
Creatinine, Ser: 1.51 mg/dL — ABNORMAL HIGH (ref 0.61–1.24)
GFR calc Af Amer: 56 mL/min — ABNORMAL LOW (ref >60)
GFR calc non Af Amer: 48 mL/min — ABNORMAL LOW (ref >60)
GLUCOSE: 102 mg/dL — AB (ref 65–99)
POTASSIUM: 4.1 mmol/L (ref 3.5–5.1)
Sodium: 135 mmol/L (ref 135–145)
TOTAL PROTEIN: 5.2 g/dL — AB (ref 6.5–8.1)

## 2015-05-26 LAB — CBC WITH DIFFERENTIAL/PLATELET
BASOS ABS: 0 10*3/uL (ref 0.0–0.1)
BASOS PCT: 0 %
Eosinophils Absolute: 0.4 10*3/uL (ref 0.0–0.7)
Eosinophils Relative: 9 %
HEMATOCRIT: 26.6 % — AB (ref 39.0–52.0)
HEMOGLOBIN: 8.7 g/dL — AB (ref 13.0–17.0)
Lymphocytes Relative: 18 %
Lymphs Abs: 0.8 10*3/uL (ref 0.7–4.0)
MCH: 34.4 pg — ABNORMAL HIGH (ref 26.0–34.0)
MCHC: 32.7 g/dL (ref 30.0–36.0)
MCV: 105.1 fL — ABNORMAL HIGH (ref 78.0–100.0)
MONO ABS: 0.5 10*3/uL (ref 0.1–1.0)
Monocytes Relative: 11 %
NEUTROS ABS: 2.7 10*3/uL (ref 1.7–7.7)
NEUTROS PCT: 61 %
Platelets: 101 10*3/uL — ABNORMAL LOW (ref 150–400)
RBC: 2.53 MIL/uL — ABNORMAL LOW (ref 4.22–5.81)
RDW: 21 % — AB (ref 11.5–15.5)
WBC: 4.4 10*3/uL (ref 4.0–10.5)

## 2015-05-26 LAB — GLUCOSE, CAPILLARY
GLUCOSE-CAPILLARY: 87 mg/dL (ref 65–99)
GLUCOSE-CAPILLARY: 109 mg/dL — AB (ref 65–99)
Glucose-Capillary: 83 mg/dL (ref 65–99)
Glucose-Capillary: 149 mg/dL — ABNORMAL HIGH (ref 65–99)

## 2015-05-26 MED ORDER — LIDOCAINE HCL 2 % EX GEL
1.0000 "application " | Freq: Once | CUTANEOUS | Status: AC
  Administered 2015-05-26: 1 via TOPICAL
  Filled 2015-05-26: qty 5

## 2015-05-26 MED ORDER — HALOPERIDOL LACTATE 5 MG/ML IJ SOLN
2.0000 mg | Freq: Four times a day (QID) | INTRAMUSCULAR | Status: DC | PRN
  Administered 2015-05-26: 2 mg via INTRAVENOUS
  Filled 2015-05-26: qty 1

## 2015-05-26 MED ORDER — CIPROFLOXACIN HCL 500 MG PO TABS
500.0000 mg | ORAL_TABLET | Freq: Two times a day (BID) | ORAL | Status: DC
  Administered 2015-05-26 – 2015-05-29 (×6): 500 mg via ORAL
  Filled 2015-05-26 (×6): qty 1

## 2015-05-26 MED ORDER — HALOPERIDOL LACTATE 5 MG/ML IJ SOLN
5.0000 mg | Freq: Once | INTRAMUSCULAR | Status: AC
  Administered 2015-05-26: 5 mg via INTRAVENOUS
  Filled 2015-05-26: qty 1

## 2015-05-26 MED ORDER — TAMSULOSIN HCL 0.4 MG PO CAPS
0.4000 mg | ORAL_CAPSULE | Freq: Every day | ORAL | Status: DC
  Administered 2015-05-26 – 2015-05-28 (×3): 0.4 mg via ORAL
  Filled 2015-05-26 (×3): qty 1

## 2015-05-26 NOTE — Progress Notes (Signed)
[facility] Progress Note   Subjective:  UOP not all recorded, some issues with urinary obstruction s/p foley with tons of UOP- creatinine stable to improved- in restraints apparently chewing thru IV tubing ???  Filed Vitals:   05/25/15 1517 05/25/15 2115 05/26/15 0519 05/26/15 1000  BP: 113/68 123/83 113/70 124/82  Pulse: 83 95 89 87  Temp: 99.2 F (37.3 C) 98.6 F (37 C) 98.4 F (36.9 C) 98.2 F (36.8 C)  TempSrc: Oral Oral Oral Oral  Resp: 17 18 17 18   Height:      Weight:  72.5 kg (159 lb 13.3 oz)    SpO2: 100% 100% 100% 100%   Exam: Sitting up in bed , eating lunch No jvd Chest faint basilar rales bilat RRR no MRG Abd soft dec'd BS no gross ascites GU dark amber urine 1-2+ edema LE's/ UE's Neuro alert, nonfocal  UA 10/26 - 100 prot, 3-6 wbc, 7-10 rbc UNa 19 UCr 236 Urine M-spike is negative CXR 10/29 no active disease   Assessment: 1 AKI on CKD3 - due to sepsis/ shock / ATN. S/P CRRT 10/27 thru 11/4 then regular HD 11/5.  Creatinine stable to improved with no dialysis for 3 days.   Good UOP especially with foley drainage.   CKD stage III at baseline with a  creat 1.4- 1.8.  He will not need further HD this admit- I will give nurses permission to take out HD cath if is not needed for IV access 3 ID on abx for GNR in blood and different GNR in bone marrow cx's- on Rocephin 4 Shock - resovled 5 ^LFT's supportive care 6 Hx dilated CM EF 25% 7 Vol excess - resolved 8. BOO- s/p foley- will likely need to stay in and may need GU input  Renal will sign off and follow his labs, call with concerns      Sumeya Yontz A   05/26/2015, 11:28 AM    Recent Labs Lab 05/21/15 0420 05/21/15 1611 05/22/15 0501 05/24/15 0522 05/25/15 0410 05/26/15 0430  NA 138 137 137 137 135 135  K 4.1 4.3 4.6 4.4 4.1 4.1  CL 105 103 104 101 102 101  CO2 28 28 29 28 27 26   GLUCOSE 96 112* 107* 85 92 102*  BUN 17 17 17 18  26* 23*  CREATININE 1.27* 1.22 1.10  1.70* 1.76* 1.51*  CALCIUM 8.8* 8.6* 8.6* 8.6* 8.2* 8.3*  PHOS 3.1 3.0 2.9  --   --   --     Recent Labs Lab 05/20/15 0515  05/22/15 0501 05/24/15 0522 05/26/15 0430  AST 222*  --   --  93* 76*  ALT 178*  --   --  115* 86*  ALKPHOS 236*  --   --  178* 177*  BILITOT 10.4*  --   --  5.2* 3.7*  PROT 5.7*  --   --  5.3* 5.2*  ALBUMIN 1.8*  1.9*  < > 2.3* 1.8* 1.8*  < > = values in this interval not displayed.  Recent Labs Lab 05/24/15 0522 05/25/15 0410 05/26/15 0430  WBC 6.1 5.3 4.4  NEUTROABS 4.2 3.3 2.7  HGB 8.9* 8.5* 8.7*  HCT 27.1* 25.4* 26.6*  MCV 104.6* 105.8* 105.1*  PLT 89* 87* 101*   . aspirin  81 mg Oral Daily  . carvedilol  3.125 mg Oral BID WC  . cefTRIAXone (ROCEPHIN)  IV  2 g Intravenous Q24H  . feeding supplement (GLUCERNA SHAKE)  237 mL Oral BID BM  .  feeding supplement (PRO-STAT SUGAR FREE 64)  30 mL Oral q morning - 10a  . hydrocerin   Topical TID  . hydrocortisone  10 mg Oral BID  . insulin aspart  0-9 Units Subcutaneous TID WC  . sodium chloride  3 mL Intravenous Q12H  . tamsulosin  0.4 mg Oral QPC supper     acetaminophen **OR** [DISCONTINUED] acetaminophen, ondansetron **OR** ondansetron (ZOFRAN) IV, sodium chloride

## 2015-05-26 NOTE — Progress Notes (Signed)
[facility] for Infectious Disease   Reason for visit: Follow up on bacteremia, eosinophilia  Interval History:   Remains afebrile.   Physical Exam: Constitutional:  Filed Vitals:   05/26/15 1000  BP: 124/82  Pulse: 87  Temp: 98.2 F (36.8 C)  Resp: 18   patient appears in NAD Eyes: anicteric HENT: no thrush Respiratory: Normal respiratory effort; CTA B Cardiovascular: RRR Skin with same dry, scaley rash on chest  Review of Systems: Constitutional: negative for chills Gastrointestinal: negative for diarrhea  Lab Results  Component Value Date   WBC 4.4 05/26/2015   HGB 8.7* 05/26/2015   HCT 26.6* 05/26/2015   MCV 105.1* 05/26/2015   PLT 101* 05/26/2015    Lab Results  Component Value Date   CREATININE 1.51* 05/26/2015   BUN 23* 05/26/2015   NA 135 05/26/2015   K 4.1 05/26/2015   CL 101 05/26/2015   CO2 26 05/26/2015    Lab Results  Component Value Date   ALT 86* 05/26/2015   AST 76* 05/26/2015   ALKPHOS 177* 05/26/2015     Microbiology: Recent Results (from the past 240 hour(s))  Ova and parasite examination     Status: None   Collection Time: 05/18/15 12:30 PM  Result Value Ref Range Status   Specimen Description BRONCHIAL ALVEOLAR LAVAGE  Final   Special Requests Normal  Final   Ova and parasites   Final    NO OVA OR PARASITES SEEN Performed at [facility]    Report Status 05/21/2015 FINAL  Final  Culture, respiratory (NON-Expectorated)     Status: None   Collection Time: 05/18/15 12:30 PM  Result Value Ref Range Status   Specimen Description BRONCHIAL ALVEOLAR LAVAGE  Final   Special Requests Normal  Final   Gram Stain   Final    ABUNDANT WBC PRESENT, PREDOMINANTLY PMN NO SQUAMOUS EPITHELIAL CELLS SEEN NO ORGANISMS SEEN Performed at [facility]    Culture   Final    RARE ESCHERICHIA COLI Performed at [facility]    Report Status 05/21/2015 FINAL  Final   Organism ID, Bacteria ESCHERICHIA COLI  Final      Susceptibility   Escherichia coli - MIC*    AMPICILLIN >=32 RESISTANT Resistant     AMPICILLIN/SULBACTAM >=32 RESISTANT Resistant     CEFEPIME <=1 SENSITIVE Sensitive     CEFTAZIDIME <=1 SENSITIVE Sensitive     CEFTRIAXONE <=1 SENSITIVE Sensitive     CIPROFLOXACIN 0.5 SENSITIVE Sensitive     GENTAMICIN >=16 RESISTANT Resistant     IMIPENEM <=0.25 SENSITIVE Sensitive     PIP/TAZO <=4 SENSITIVE Sensitive     TOBRAMYCIN 8 INTERMEDIATE Intermediate     TRIMETH/SULFA Value in next row Sensitive      <=20 SENSITIVE(NOTE)    * RARE ESCHERICHIA COLI  Fungus Culture with Smear     Status: None (Preliminary result)   Collection Time: 05/18/15 12:30 PM  Result Value Ref Range Status   Specimen Description BRONCHIAL ALVEOLAR LAVAGE  Final   Special Requests Normal  Final   Fungal Smear   Final    NO YEAST OR FUNGAL ELEMENTS SEEN Performed at [facility]    Culture   Final    CULTURE IN PROGRESS FOR FOUR WEEKS Performed at [facility]    Report Status PENDING  Incomplete  AFB culture with smear     Status: None (Preliminary result)   Collection Time: 05/18/15 12:30 PM  Result Value  Ref Range Status   Specimen Description BRONCHIAL ALVEOLAR LAVAGE  Final   Special Requests Normal  Final   Acid Fast Smear   Final    NO ACID FAST BACILLI SEEN Performed at [facility]    Culture   Final    CULTURE WILL BE EXAMINED FOR 6 WEEKS BEFORE ISSUING A FINAL REPORT Performed at [facility]    Report Status PENDING  Incomplete  Ova and parasite examination     Status: None   Collection Time: 05/21/15 11:31 AM  Result Value Ref Range Status   Specimen Description SPUTUM  Final   Ova and parasites   Final    NO OVA OR PARASITES SEEN MODERATE WBC Performed at [facility]    Report Status 05/25/2015 FINAL  Final    Impression:  1. Septic shock due to Enterobacter, resolving 2.  Eosiniophilia, resolved 3. Renal failure  Plan: 1. Can  transition to oral ciprofloxacin, renally dosed for 7 more days.

## 2015-05-26 NOTE — Progress Notes (Signed)
[facility] PROGRESS NOTE  [name] IDP:[id number] DOB: [birth year] DOA: 05/03/2015 PCP: Charolette Forward, MD  Brief narrative 59 year old homeless male with history of hypertension, and ST MI, arthritis, anxiety/depression, history of processing abuse with cardiomyopathy (EF of 20%), chronic kidney disease stage IV, hyperlipidemia, history of noncompliance presented to [facility] long hospital on 10/16 with neck/facial swelling with hives. He also had intermittent rash and fever. He had transaminitis (AST 299, ALT of 283 and total bili of 3.9) CT of the neck showed asymmetric enlargement of the right second mandibular gland which was nonspecific. He had significant eosinophilia on his white count. Patient was placed on empiric clindamycin and then transitioned to broad-spectrum coverage with vancomycin and Zosyn. Patient's blood cultures was also growing Enterobacter. ID consulted.  a bone marrow biopsy was done to evaluate  eosinophilia and autoimmune workup sent. On 10/26 he went into respiratory distress with septic shock and transferred to ICU requiring intubation...  He also developed shock liver with oliguric renal failure requiring CVVH. Vision self extubated on 11/2. Patient now showing improved urinary output and transferred to [facility] for intermittent dialysis.  SIGNIFICANT EVENTS  10/13 Seen in ER for N/V, sore throat, diarrhea & rash 10/16 Admit to [facility] for facial swelling, cough with frothy yellow sputum, sore throat and hives. 10/26 bone marrow bx done as part of evaluation for eosinophilia. Developed worsening distress later that afternoon. intubated 10/27 cvvh started 10/28-10/31: Bone marrow bx from 26th= ecoli, BCx2 showing Enterobacter. ID following. By 10/30 pressors weaning. Still on CRRT. 10/31 attempting weaning efforts.  10/31: BAL to eval to help explain: unexplained syndrome characterized by fever, hypereosinophilia with high IgE. Primaxin changed to Rocephin by  ID 11/1: sedated. No spontaneous effort during weaning. Fentanyl gtt stopped.  11/2 self extubated 11/4 transferred to [facility] hospitalist service for need of intermittent dialysis. 11/5: underwent HD 11/6-7 ; good urine output. Holding off further dialysis. 11/8: urinary retention after foley removed. Coude catheter placed    Assessment/Plan: Acute hypoxic respiratory failure Possibly associated with sepsis. Bronchoscopy showing tracheobronchitis. Biopsy growing Escherichia coli. Intubated from 10/26-11/2. Currently stable. Bedside incentive spirometry.  Septic shock with shock liver and oliguric renal failure Sepsis currently resolved. LFTs improving.  Urine output improving. (12 50 mL past 24 hours) Baseline creatinine of 1.4-1.8. Creatinine of 1.5 today. Renal following closely. Discontinue Foley.  Septic shock due to Enterobacter. Required pressors and intubation along with CVVHD. Source unclear. Bronchial secretion on BAL use sensitive Escherichia coli. Also bone marrow biopsy showing Escherichia coli without clear source. On empiric Rocephin. Discussed with Dr. Linus Salmons since pt has issue with homelessness and PICC line is not a good option for now given his renal function, he recommends oral ciprofloxacin 500 mg bid to complete 14 days of abx.  Acute encephalopathy on 11/8  patient restless and confused. Requiring wrist restrain. Suspect sundowning. Will place on prn haldol.  Acute Urinary retention Foley removed on 11/7 but had had outlet obstruction. Has good urine output after the catheter placed in (unable to place in Foley). Will need foley catheter for now with voiding trial possibly as outpt. added flomax   Escherichia coli on bone marrow biopsy No clear source. Unclear if this is contaminant. ID suggesting a possible need for colonoscopy.  Eosinophilia No clear etiology. Patient on steroid and eosinophilia has resolved. Weaning steroids. Ova and parasite from  bronchial lavage negative.  Dilated cardiomyopathy EF of 25-30% with diffuse hypokinesis. Severely reduced biventricular function. Continue aspirin, Coreg,  Plavix.  Severe protein -calorie malnutrition Continue supplemental.  Thrombocytopenia Drop in platelets progressively. Stopped pepcid and plavix.  monitor.    Anemia  possibly of chronic disease.  Diet: renal/ diabetic   Code Status: Full code Family Communication: None at bedside Disposition Plan: monitor off dialysis. Renal to sign off. Will d/w SW on placement given his homeless situation and underlying medical issues and need for follow up..   Consultants:  Renal  PCCM  ID  Procedures:  10/13 RUQ [place] - mild gallbladder wall thickening, no gallstones observed 10/16 CXR - no acute infiltrate 10/16 CT Soft tissue Neck - asymmetric enlargement of R submandibular gland relative to the left is non-specific but could be due to infection, inflammation or tumor. Negative for abscess, diffuse subcutaneous edema 10/17 MRI Soft Tissue Neck - motion degraded study, no discrete mass, grossly similar subcutaneous edema involving the bilateral submandibular spaces (nonspecific but must consider infection/cellulitis), no discrete abscess  10/21 CXR - no acute infiltrate 10/21 CT Abd/Pelvis - diffuse body wall edema suggestive of anasarca, mesenteric edema but no overt ascites, no acute intra-abdominal abnormalities, stable prostate gland enlargement and small hiatal hernia 10/26 TTE - LV & RV moderately dilated. EF 25-30% w/ diffuse hypokinesis of LV. Moderate MR. No pericardial effusion. 10/26 Renal US - Medical renal disease. Right pleural effusion. No hydronephrosis. 10/27 CT chest, abd, pelvis - Bibasilar atelectasis/ early infiltrate, anasarca. 10/29 RUQ [place] - no stones. No perichole fluid. CBD 2m. No focal liver lesion. Right pleural effusion. 10/31 bronchoscopy > thick secretions, BAL sent  Antibiotics:               Ciprofloxacin 11/8 until 11/14  Ceftriaxone 10/31--11/8  Vancomycin 10/16-20, 26-27  Primaxin 10/27-10/31  Zosyn 10/17, 10/21, 10/26-10/27  Clindamycin 10/16-10/17  HPI/Subjective: Seen and examined. Having urinary rentention after foley removed yesterday. Also was agitated and confused  Objective: Filed Vitals:   05/26/15 1000  BP: 124/82  Pulse: 87  Temp: 98.2 F (36.8 C)  Resp: 18    Intake/Output Summary (Last 24 hours) at 05/26/15 1455 Last data filed at 05/26/15 1413  Gross per 24 hour  Intake    840 ml  Output    950 ml  Net   -110 ml   Filed Weights   05/23/15 1955 05/24/15 1948 05/25/15 2115  Weight: 72.5 kg (159 lb 13.3 oz) 72.6 kg (160 lb 0.9 oz) 72.5 kg (159 lb 13.3 oz)    Exam:   General:   no acute distress, confused  HEENT:Pallor+ Temporal wasting, icteric ,moist oral mucosa, rt IJ  Chest: Clear to auscultation bilaterally  CVS: Normal S1 and S2, no murmurs rub or gallop  GI: Soft, non distended ,nontender, bowel sounds present,coude catheter placed.  Musculoskeletal : Warm, no edema  CNS: irritable and confused    Data Reviewed: Basic Metabolic Panel:  Recent Labs Lab 05/20/15 0515 05/20/15 1610 05/21/15 0420 05/21/15 1611 05/22/15 0501 05/24/15 0522 05/25/15 0410 05/26/15 0430  NA 138 137 138 137 137 137 135 135  K 3.8 3.7 4.1 4.3 4.6 4.4 4.1 4.1  CL 107 102 105 103 104 101 102 101  CO2 '28 28 28 28 29 28 27 26  ' GLUCOSE 134* 111* 96 112* 107* 85 92 102*  BUN '19 17 17 17 17 18 ' 26* 23*  CREATININE 1.25* 1.13 1.27* 1.22 1.10 1.70* 1.76* 1.51*  CALCIUM 8.1* 8.4* 8.8* 8.6* 8.6* 8.6* 8.2* 8.3*  MG 2.3  --  2.3  --  2.2  --   --   --  PHOS 2.8 2.6 3.1 3.0 2.9  --   --   --    Liver Function Tests:  Recent Labs Lab 05/20/15 0515  05/21/15 0420 05/21/15 1611 05/22/15 0501 05/24/15 0522 05/26/15 0430  AST 222*  --   --   --   --  93* 76*  ALT 178*  --   --   --   --  115* 86*  ALKPHOS 236*  --   --   --   --  178* 177*   BILITOT 10.4*  --   --   --   --  5.2* 3.7*  PROT 5.7*  --   --   --   --  5.3* 5.2*  ALBUMIN 1.8*  1.9*  < > 2.3* 2.3* 2.3* 1.8* 1.8*  < > = values in this interval not displayed. No results for input(s): LIPASE, AMYLASE in the last 168 hours. No results for input(s): AMMONIA in the last 168 hours. CBC:  Recent Labs Lab 05/22/15 0501 05/23/15 0505 05/24/15 0522 05/25/15 0410 05/26/15 0430  WBC 12.2* 6.6 6.1 5.3 4.4  NEUTROABS 8.8* 4.4 4.2 3.3 2.7  HGB 9.0* 7.5* 8.9* 8.5* 8.7*  HCT 27.5* 23.0* 27.1* 25.4* 26.6*  MCV 110.4* 108.0* 104.6* 105.8* 105.1*  PLT 121* 94* 89* 87* 101*   Cardiac Enzymes: No results for input(s): CKTOTAL, CKMB, CKMBINDEX, TROPONINI in the last 168 hours. BNP (last 3 results)  Recent Labs  04/30/15 1459 05/03/15 1009 05/07/15 1439  BNP 1557.1* 752.1* 1402.2*    ProBNP (last 3 results)  Recent Labs  04/13/15 1012  PROBNP 3040.00*    CBG:  Recent Labs Lab 05/25/15 1136 05/25/15 1623 05/25/15 2114 05/26/15 0811 05/26/15 1202  GLUCAP 107* 101* 97 83 149*    Recent Results (from the past 240 hour(s))  Ova and parasite examination     Status: None   Collection Time: 05/18/15 12:30 PM  Result Value Ref Range Status   Specimen Description BRONCHIAL ALVEOLAR LAVAGE  Final   Special Requests Normal  Final   Ova and parasites   Final    NO OVA OR PARASITES SEEN Performed at [facility]    Report Status 05/21/2015 FINAL  Final  Culture, respiratory (NON-Expectorated)     Status: None   Collection Time: 05/18/15 12:30 PM  Result Value Ref Range Status   Specimen Description BRONCHIAL ALVEOLAR LAVAGE  Final   Special Requests Normal  Final   Gram Stain   Final    ABUNDANT WBC PRESENT, PREDOMINANTLY PMN NO SQUAMOUS EPITHELIAL CELLS SEEN NO ORGANISMS SEEN Performed at [facility]    Culture   Final    RARE ESCHERICHIA COLI Performed at [facility]    Report Status 05/21/2015 FINAL  Final   Organism  ID, Bacteria ESCHERICHIA COLI  Final      Susceptibility   Escherichia coli - MIC*    AMPICILLIN >=32 RESISTANT Resistant     AMPICILLIN/SULBACTAM >=32 RESISTANT Resistant     CEFEPIME <=1 SENSITIVE Sensitive     CEFTAZIDIME <=1 SENSITIVE Sensitive     CEFTRIAXONE <=1 SENSITIVE Sensitive     CIPROFLOXACIN 0.5 SENSITIVE Sensitive     GENTAMICIN >=16 RESISTANT Resistant     IMIPENEM <=0.25 SENSITIVE Sensitive     PIP/TAZO <=4 SENSITIVE Sensitive     TOBRAMYCIN 8 INTERMEDIATE Intermediate     TRIMETH/SULFA Value in next row Sensitive      <=20 SENSITIVE(NOTE)    * RARE ESCHERICHIA COLI  Fungus Culture with Smear     Status: None (Preliminary result)   Collection Time: 05/18/15 12:30 PM  Result Value Ref Range Status   Specimen Description BRONCHIAL ALVEOLAR LAVAGE  Final   Special Requests Normal  Final   Fungal Smear   Final    NO YEAST OR FUNGAL ELEMENTS SEEN Performed at [facility]    Culture   Final    CULTURE IN PROGRESS FOR FOUR WEEKS Performed at [facility]    Report Status PENDING  Incomplete  AFB culture with smear     Status: None (Preliminary result)   Collection Time: 05/18/15 12:30 PM  Result Value Ref Range Status   Specimen Description BRONCHIAL ALVEOLAR LAVAGE  Final   Special Requests Normal  Final   Acid Fast Smear   Final    NO ACID FAST BACILLI SEEN Performed at [facility]    Culture   Final    CULTURE WILL BE EXAMINED FOR 6 WEEKS BEFORE ISSUING A FINAL REPORT Performed at [facility]    Report Status PENDING  Incomplete  Ova and parasite examination     Status: None   Collection Time: 05/21/15 11:31 AM  Result Value Ref Range Status   Specimen Description SPUTUM  Final   Ova and parasites   Final    NO OVA OR PARASITES SEEN MODERATE WBC Performed at [facility]    Report Status 05/25/2015 FINAL  Final     Studies: No results found.  Scheduled Meds: . aspirin  81 mg Oral Daily  . carvedilol   3.125 mg Oral BID WC  . cefTRIAXone (ROCEPHIN)  IV  2 g Intravenous Q24H  . feeding supplement (GLUCERNA SHAKE)  237 mL Oral BID BM  . feeding supplement (PRO-STAT SUGAR FREE 64)  30 mL Oral q morning - 10a  . hydrocerin   Topical TID  . hydrocortisone  10 mg Oral BID  . insulin aspart  0-9 Units Subcutaneous TID WC  . sodium chloride  3 mL Intravenous Q12H  . tamsulosin  0.4 mg Oral QPC supper   Continuous Infusions:     Time spent: 25 minutes    [name], [place]  [facility] Pager [phone]. If 7PM-7AM, please contact night-coverage at [email or web address], password [email or web address] 05/26/2015, 2:55 PM  LOS: 22 days

## 2015-05-26 NOTE — Progress Notes (Signed)
Patient c/o abdominal pain and being unable to void. Bladder scan obtained with >658 mls. Attempted to I&O cath patient, but was unable to advance catheter due to meeting resistance. Some blood noted at end of catheter. Dr. Clementeen Graham notified. Orders placed for coude/foley catheter insertion. Will monitor.  Joellen Jersey, RN.

## 2015-05-27 ENCOUNTER — Encounter (HOSPITAL_COMMUNITY): Payer: Self-pay

## 2015-05-27 LAB — CBC WITH DIFFERENTIAL/PLATELET
BASOS ABS: 0 10*3/uL (ref 0.0–0.1)
BASOS PCT: 1 %
Eosinophils Absolute: 0.5 10*3/uL (ref 0.0–0.7)
Eosinophils Relative: 12 %
HEMATOCRIT: 27.4 % — AB (ref 39.0–52.0)
HEMOGLOBIN: 9.1 g/dL — AB (ref 13.0–17.0)
LYMPHS PCT: 24 %
Lymphs Abs: 1 10*3/uL (ref 0.7–4.0)
MCH: 35.5 pg — ABNORMAL HIGH (ref 26.0–34.0)
MCHC: 33.2 g/dL (ref 30.0–36.0)
MCV: 107 fL — AB (ref 78.0–100.0)
MONO ABS: 0.6 10*3/uL (ref 0.1–1.0)
MONOS PCT: 14 %
NEUTROS ABS: 2.1 10*3/uL (ref 1.7–7.7)
NEUTROS PCT: 49 %
Platelets: 112 10*3/uL — ABNORMAL LOW (ref 150–400)
RBC: 2.56 MIL/uL — ABNORMAL LOW (ref 4.22–5.81)
RDW: 20.6 % — AB (ref 11.5–15.5)
WBC: 4.3 10*3/uL (ref 4.0–10.5)

## 2015-05-27 LAB — RENAL FUNCTION PANEL
ALBUMIN: 1.8 g/dL — AB (ref 3.5–5.0)
ANION GAP: 5 (ref 5–15)
BUN: 19 mg/dL (ref 6–20)
CHLORIDE: 104 mmol/L (ref 101–111)
CO2: 27 mmol/L (ref 22–32)
Calcium: 8.2 mg/dL — ABNORMAL LOW (ref 8.9–10.3)
Creatinine, Ser: 1.3 mg/dL — ABNORMAL HIGH (ref 0.61–1.24)
GFR calc Af Amer: >60 mL/min (ref >60)
GFR, EST NON AFRICAN AMERICAN: 58 mL/min — AB (ref >60)
Glucose, Bld: 88 mg/dL (ref 65–99)
PHOSPHORUS: 2.9 mg/dL (ref 2.5–4.6)
Potassium: 3.8 mmol/L (ref 3.5–5.1)
Sodium: 136 mmol/L (ref 135–145)

## 2015-05-27 LAB — GLUCOSE, CAPILLARY
GLUCOSE-CAPILLARY: 68 mg/dL (ref 65–99)
GLUCOSE-CAPILLARY: 134 mg/dL — AB (ref 65–99)
GLUCOSE-CAPILLARY: 56 mg/dL — AB (ref 65–99)
GLUCOSE-CAPILLARY: 105 mg/dL — AB (ref 65–99)
GLUCOSE-CAPILLARY: 74 mg/dL (ref 65–99)
GLUCOSE-CAPILLARY: 71 mg/dL (ref 65–99)

## 2015-05-27 LAB — HEPATIC FUNCTION PANEL
ALT: 94 U/L — AB (ref 17–63)
AST: 80 U/L — ABNORMAL HIGH (ref 15–41)
Albumin: 2 g/dL — ABNORMAL LOW (ref 3.5–5.0)
Alkaline Phosphatase: 205 U/L — ABNORMAL HIGH (ref 38–126)
BILIRUBIN DIRECT: 1.8 mg/dL — AB (ref 0.1–0.5)
BILIRUBIN INDIRECT: 1.7 mg/dL — AB (ref 0.3–0.9)
TOTAL PROTEIN: 5.5 g/dL — AB (ref 6.5–8.1)
Total Bilirubin: 3.5 mg/dL — ABNORMAL HIGH (ref 0.3–1.2)

## 2015-05-27 NOTE — Progress Notes (Signed)
[facility] PROGRESS NOTE  [name] TFT:[id number] DOB: December 20, 1955 DOA: 05/03/2015 PCP: Charolette Forward, MD  Brief narrative 59 year old homeless male with history of hypertension, and ST MI, arthritis, anxiety/depression, history of processing abuse with cardiomyopathy (EF of 20%), chronic kidney disease stage IV, hyperlipidemia, history of noncompliance presented to [facility] long hospital on 10/16 with neck/facial swelling with hives. He also had intermittent rash and fever. He had transaminitis (AST 299, ALT of 283 and total bili of 3.9) CT of the neck showed asymmetric enlargement of the right second mandibular gland which was nonspecific. He had significant eosinophilia on his white count. Patient was placed on empiric clindamycin and then transitioned to broad-spectrum coverage with vancomycin and Zosyn. Patient's blood cultures was also growing Enterobacter. ID consulted.  a bone marrow biopsy was done to evaluate  eosinophilia and autoimmune workup sent. On 10/26 he went into respiratory distress with septic shock and transferred to ICU requiring intubation...  He also developed shock liver with oliguric renal failure requiring CVVH. Vision self extubated on 11/2. Patient now showing improved urinary output and transferred to [facility] for intermittent dialysis.  SIGNIFICANT EVENTS  10/13 Seen in ER for N/V, sore throat, diarrhea & rash 10/16 Admit to [facility] for facial swelling, cough with frothy yellow sputum, sore throat and hives. 10/26 bone marrow bx done as part of evaluation for eosinophilia. Developed worsening distress later that afternoon. intubated 10/27 cvvh started 10/28-10/31: Bone marrow bx from 26th= ecoli, BCx2 showing Enterobacter. ID following. By 10/30 pressors weaning. Still on CRRT. 10/31 attempting weaning efforts.  10/31: BAL to eval to help explain: unexplained syndrome characterized by fever, hypereosinophilia with high IgE. Primaxin changed to Rocephin by  ID 11/1: sedated. No spontaneous effort during weaning. Fentanyl gtt stopped.  11/2 self extubated 11/4 [facility] to [facility] service for need of intermittent dialysis. 11/5: underwent HD 11/6-7 ; good urine output. Holding off further dialysis. 11/8: urinary retention after foley removed. Coude catheter placed    Assessment/Plan: Acute hypoxic respiratory failure Possibly associated with sepsis. Bronchoscopy showing tracheobronchitis. Biopsy growing Escherichia coli. Intubated from 10/26-11/2. Currently stable. Bedside incentive spirometry.  Septic shock with shock liver and oliguric renal failure Sepsis currently resolved. LFTs improving.   Baseline creatinine of 1.4-1.8. Creatinine of 1.3..  Renal sign off.  Urine out put 1.8 L.   Septic shock due to Enterobacter. Required pressors and intubation along with CVVHD. Source unclear. Bronchial secretion on BAL use sensitive Escherichia coli. Also bone marrow biopsy showing Escherichia coli without clear source. On empiric Rocephin. Discussed with Dr. Linus Salmons since pt has issue with homelessness and PICC line is not a good option for now given his renal function, he recommends oral ciprofloxacin 500 mg bid to complete 14 days of abx.  Hypoglycemia;  frequents meals /  Monitor for now.   Acute encephalopathy on 11/8  patient restless and confused. Requiring wrist restrain. Suspect sundowning.  Alert, following command. Improved.  Discontinue haldol.   Acute Urinary retention Foley removed on 11/7 but had had outlet obstruction. Has good urine output after the catheter placed in (unable to place in Foley). Will need foley catheter for now with voiding trial possibly as outpt. added flomax  Escherichia coli on bone marrow biopsy No clear source. Unclear if this is contaminant. ID suggesting a possible need for colonoscopy.  Eosinophilia No clear etiology. Patient on steroid and eosinophilia has resolved.  Steroids was discontinue. Ova and parasite from bronchial lavage negative.  Dilated cardiomyopathy EF  of 25-30% with diffuse hypokinesis. Severely reduced biventricular function. Continue aspirin, Coreg, Plavix.  Severe protein -calorie malnutrition Continue supplemental.  Thrombocytopenia Drop in platelets progressively. Stopped pepcid and plavix.  monitor.    Anemia  possibly of chronic disease.  Diet: renal/ diabetic   Code Status: Full code Family Communication: None at bedside Disposition Plan: discharge in 24 hours.    Consultants:  Renal  PCCM  ID  Procedures:  10/13 RUQ [place] - mild gallbladder wall thickening, no gallstones observed 10/16 CXR - no acute infiltrate 10/16 CT Soft tissue Neck - asymmetric enlargement of R submandibular gland relative to the left is non-specific but could be due to infection, inflammation or tumor. Negative for abscess, diffuse subcutaneous edema 10/17 MRI Soft Tissue Neck - motion degraded study, no discrete mass, grossly similar subcutaneous edema involving the bilateral submandibular spaces (nonspecific but must consider infection/cellulitis), no discrete abscess  10/21 CXR - no acute infiltrate 10/21 CT Abd/Pelvis - diffuse body wall edema suggestive of anasarca, mesenteric edema but no overt ascites, no acute intra-abdominal abnormalities, stable prostate gland enlargement and small hiatal hernia 10/26 TTE - LV & RV moderately dilated. EF 25-30% w/ diffuse hypokinesis of LV. Moderate MR. No pericardial effusion. 10/26 Renal US - Medical renal disease. Right pleural effusion. No hydronephrosis. 10/27 CT chest, abd, pelvis - Bibasilar atelectasis/ early infiltrate, anasarca. 10/29 RUQ [place] - no stones. No perichole fluid. CBD 4m. No focal liver lesion. Right pleural effusion. 10/31 bronchoscopy > thick secretions, BAL sent  Antibiotics:              Ciprofloxacin 11/8 until 11/14  Ceftriaxone 10/31--11/8  Vancomycin  10/16-20, 26-27  Primaxin 10/27-10/31  Zosyn 10/17, 10/21, 10/26-10/27  Clindamycin 10/16-10/17  HPI/Subjective: He is alert feeling better  He relates that he will be able to stay with his brother.   Objective: Filed Vitals:   05/27/15 0939  BP: 127/71  Pulse: 103  Temp: 98.8 F (37.1 C)  Resp: 18    Intake/Output Summary (Last 24 hours) at 05/27/15 1455 Last data filed at 05/27/15 1300  Gross per 24 hour  Intake   1440 ml  Output   1450 ml  Net    -10 ml   Filed Weights   05/24/15 1948 05/25/15 2115 05/26/15 2038  Weight: 72.6 kg (160 lb 0.9 oz) 72.5 kg (159 lb 13.3 oz) 73.2 kg (161 lb 6 oz)    Exam:   General:   no acute distress, confused  HEENT:Pallor+ Temporal wasting, icteric ,moist oral mucosa, rt IJ  Chest: Clear to auscultation bilaterally  CVS: Normal S1 and S2, no murmurs rub or gallop  GI: Soft, non distended ,nontender, bowel sounds present,coude catheter placed.  Musculoskeletal : Warm, no edema  CNS: alert in no distress    Data Reviewed: Basic Metabolic Panel:  Recent Labs Lab 05/20/15 1610 05/21/15 0420 05/21/15 1611 05/22/15 0501 05/24/15 0522 05/25/15 0410 05/26/15 0430 05/27/15 0440  NA 137 138 137 137 137 135 135 136  K 3.7 4.1 4.3 4.6 4.4 4.1 4.1 3.8  CL 102 105 103 104 101 102 101 104  CO2 '28 28 28 29 28 27 26 27  ' GLUCOSE 111* 96 112* 107* 85 92 102* 88  BUN '17 17 17 17 18 ' 26* 23* 19  CREATININE 1.13 1.27* 1.22 1.10 1.70* 1.76* 1.51* 1.30*  CALCIUM 8.4* 8.8* 8.6* 8.6* 8.6* 8.2* 8.3* 8.2*  MG  --  2.3  --  2.2  --   --   --   --  PHOS 2.6 3.1 3.0 2.9  --   --   --  2.9   Liver Function Tests:  Recent Labs Lab 05/21/15 1611 05/22/15 0501 05/24/15 0522 05/26/15 0430 05/27/15 0440  AST  --   --  93* 76*  --   ALT  --   --  115* 86*  --   ALKPHOS  --   --  178* 177*  --   BILITOT  --   --  5.2* 3.7*  --   PROT  --   --  5.3* 5.2*  --   ALBUMIN 2.3* 2.3* 1.8* 1.8* 1.8*   No results for input(s): LIPASE,  AMYLASE in the last 168 hours. No results for input(s): AMMONIA in the last 168 hours. CBC:  Recent Labs Lab 05/23/15 0505 05/24/15 0522 05/25/15 0410 05/26/15 0430 05/27/15 0440  WBC 6.6 6.1 5.3 4.4 4.3  NEUTROABS 4.4 4.2 3.3 2.7 2.1  HGB 7.5* 8.9* 8.5* 8.7* 9.1*  HCT 23.0* 27.1* 25.4* 26.6* 27.4*  MCV 108.0* 104.6* 105.8* 105.1* 107.0*  PLT 94* 89* 87* 101* 112*   Cardiac Enzymes: No results for input(s): CKTOTAL, CKMB, CKMBINDEX, TROPONINI in the last 168 hours. BNP (last 3 results)  Recent Labs  04/30/15 1459 05/03/15 1009 05/07/15 1439  BNP 1557.1* 752.1* 1402.2*    ProBNP (last 3 results)  Recent Labs  04/13/15 1012  PROBNP 3040.00*    CBG:  Recent Labs Lab 05/26/15 2035 05/27/15 0723 05/27/15 0828 05/27/15 1158 05/27/15 1234  GLUCAP 109* 68 134* 56* 105*    Recent Results (from the past 240 hour(s))  Ova and parasite examination     Status: None   Collection Time: 05/18/15 12:30 PM  Result Value Ref Range Status   Specimen Description BRONCHIAL ALVEOLAR LAVAGE  Final   Special Requests Normal  Final   Ova and parasites   Final    NO OVA OR PARASITES SEEN Performed at [facility]    Report Status 05/21/2015 FINAL  Final  Culture, respiratory (NON-Expectorated)     Status: None   Collection Time: 05/18/15 12:30 PM  Result Value Ref Range Status   Specimen Description BRONCHIAL ALVEOLAR LAVAGE  Final   Special Requests Normal  Final   Gram Stain   Final    ABUNDANT WBC PRESENT, PREDOMINANTLY PMN NO SQUAMOUS EPITHELIAL CELLS SEEN NO ORGANISMS SEEN Performed at [facility]    Culture   Final    RARE ESCHERICHIA COLI Performed at [facility]    Report Status 05/21/2015 FINAL  Final   Organism ID, Bacteria ESCHERICHIA COLI  Final      Susceptibility   Escherichia coli - MIC*    AMPICILLIN >=32 RESISTANT Resistant     AMPICILLIN/SULBACTAM >=32 RESISTANT Resistant     CEFEPIME <=1 SENSITIVE Sensitive      CEFTAZIDIME <=1 SENSITIVE Sensitive     CEFTRIAXONE <=1 SENSITIVE Sensitive     CIPROFLOXACIN 0.5 SENSITIVE Sensitive     GENTAMICIN >=16 RESISTANT Resistant     IMIPENEM <=0.25 SENSITIVE Sensitive     PIP/TAZO <=4 SENSITIVE Sensitive     TOBRAMYCIN 8 INTERMEDIATE Intermediate     TRIMETH/SULFA Value in next row Sensitive      <=20 SENSITIVE(NOTE)    * RARE ESCHERICHIA COLI  Fungus Culture with Smear     Status: None (Preliminary result)   Collection Time: 05/18/15 12:30 PM  Result Value Ref Range Status   Specimen Description BRONCHIAL ALVEOLAR LAVAGE  Final  Special Requests Normal  Final   Fungal Smear   Final    NO YEAST OR FUNGAL ELEMENTS SEEN Performed at [facility]    Culture   Final    CULTURE IN PROGRESS FOR FOUR WEEKS Performed at [facility]    Report Status PENDING  Incomplete  AFB culture with smear     Status: None (Preliminary result)   Collection Time: 05/18/15 12:30 PM  Result Value Ref Range Status   Specimen Description BRONCHIAL ALVEOLAR LAVAGE  Final   Special Requests Normal  Final   Acid Fast Smear   Final    NO ACID FAST BACILLI SEEN Performed at [facility]    Culture   Final    CULTURE WILL BE EXAMINED FOR 6 WEEKS BEFORE ISSUING A FINAL REPORT Performed at [facility]    Report Status PENDING  Incomplete  Ova and parasite examination     Status: None   Collection Time: 05/21/15 11:31 AM  Result Value Ref Range Status   Specimen Description SPUTUM  Final   Ova and parasites   Final    NO OVA OR PARASITES SEEN MODERATE WBC Performed at [facility]    Report Status 05/25/2015 FINAL  Final     Studies: No results found.  Scheduled Meds: . aspirin  81 mg Oral Daily  . carvedilol  3.125 mg Oral BID WC  . ciprofloxacin  500 mg Oral BID  . feeding supplement (GLUCERNA SHAKE)  237 mL Oral BID BM  . feeding supplement (PRO-STAT SUGAR FREE 64)  30 mL Oral q morning - 10a  . hydrocerin   Topical TID   . sodium chloride  3 mL Intravenous Q12H  . tamsulosin  0.4 mg Oral QPC supper   Continuous Infusions:     Time spent: 25 minutes    Munira Polson, [place] [facility] Pager [phone]. If 7PM-7AM, please contact night-coverage at [email or web address], password [facility] 05/27/2015, 2:55 PM  LOS: 23 days

## 2015-05-27 NOTE — Progress Notes (Signed)
Hypoglycemic Event  CBG: 56  Treatment: carb snack  Symptoms: none  Follow-up CBG: Time: 12:34 CBG Result: 56  Possible Reasons for Event: unknown  Comments/MD notified: yes    Lezlie Octave

## 2015-05-27 NOTE — Progress Notes (Signed)
Hypoglycemic Event  CBG: 68  Treatment: carb snack  Symptoms: none  Follow-up CBG: Time: 8:28 CBG Result: 134  Possible Reasons for Event: unknown  Comments/MD notified: yes    Lezlie Octave

## 2015-05-27 NOTE — Progress Notes (Signed)
Physical Therapy Treatment Patient Details Name: [name] MRN: [id number] DOB: March 15, 1956 Today's Date: 05/27/2015    History of Present Illness 59 yo male with onset of sepsis with intubation 05/05/15 was referred to PT after self-extubation 11/2.  Has EF20%, homeless, shock, leukocytosis, tachycardia, metabolic acidosis    PT Comments    Progressing towards functional goals although limited by rapid increase in HR (144 bpm) with short distance ambulation today. Resting HR 113. Much improved balance but feels more confident and stable with use of a rolling walker for support. Patient will continue to benefit from skilled physical therapy services to further improve independence with functional mobility.   Follow Up Recommendations  No PT follow up     Equipment Recommendations  Rolling walker with 5" wheels    Recommendations for Other Services       Precautions / Restrictions Precautions Precautions: Fall (telemetry) Restrictions Weight Bearing Restrictions: No    Mobility  Bed Mobility               General bed mobility comments: sitting in recliner  Transfers Overall transfer level: Modified independent Equipment used: Rolling walker (2 wheeled)             General transfer comment: pt used hands on recliner to stand  Ambulation/Gait Ambulation/Gait assistance: Supervision Ambulation Distance (Feet): 80 Feet Assistive device: Rolling walker (2 wheeled) Gait Pattern/deviations: Step-through pattern;Decreased stride length Gait velocity: reduced Gait velocity interpretation: Below normal speed for age/gender General Gait Details: Demonstrates good control of RW, and prefers to hold this device for safety and confidence. no buckling or loss of balance noted. HR did quickly elevated to 144. Pt asymptomatic.    Stairs            Wheelchair Mobility    Modified Rankin (Stroke Patients Only)       Balance                                     Cognition Arousal/Alertness: Awake/alert Behavior During Therapy: WFL for tasks assessed/performed Overall Cognitive Status: Within Functional Limits for tasks assessed                      Exercises General Exercises - Lower Extremity Ankle Circles/Pumps: AROM;Both;15 reps;Seated Gluteal Sets: Strengthening;Both;10 reps;Seated Long Arc Quad: Strengthening;Both;20 reps;Seated Hip Flexion/Marching: Strengthening;Both;10 reps;Seated    General Comments General comments (skin integrity, edema, etc.): Resting HR 113, elevates to 144 with short distance ambulation although asymptomatic.      Pertinent Vitals/Pain Pain Assessment: No/denies pain    Home Living                      Prior Function            PT Goals (current goals can now be found in the care plan section) Acute Rehab PT Goals Patient Stated Goal: none stated PT Goal Formulation: Patient unable to participate in goal setting Time For Goal Achievement: 06/08/15 Potential to Achieve Goals: Good Progress towards PT goals: Progressing toward goals    Frequency  Min 3X/week    PT Plan Current plan remains appropriate    Co-evaluation             End of Session   Activity Tolerance: Treatment limited secondary to medical complications (Comment) (Elevated HR with minimal exertion) Patient left: in chair;with call bell/phone within reach;with chair  alarm set     Time: 1512-1530 PT Time Calculation (min) (ACUTE ONLY): 18 min  Charges:  $Therapeutic Exercise: 8-22 mins                    G Codes:      Ellouise Newer 06-01-2015, 3:53 PM Camille [place], [place]

## 2015-05-27 NOTE — Progress Notes (Signed)
Nutrition Follow-up  DOCUMENTATION CODES:   Not applicable  INTERVENTION:  Continue Glucerna Shake po BID, each supplement provides 220 kcal and 10 grams of protein.  Continue 30 ml Prostat po once daily, each supplement provides 100 kcal and 15 grams of protein.   Encourage adequate PO intake.   NUTRITION DIAGNOSIS:   Increased nutrient needs related to chronic illness as evidenced by estimated needs; ongoing  GOAL:   Patient will meet greater than or equal to 90% of their needs; met  MONITOR:   PO intake, Supplement acceptance, Weight trends, Labs, Skin, I & O's  REASON FOR ASSESSMENT:   Consult Enteral/tube feeding initiation and management (trickle feeds)  ASSESSMENT:   59 year old male with past medical history of hypertension, chronic combined CHF, CAD, NSTEMI (on aspirin and plavix), CKD stage 3 who presented to [facility] ED with reports of neck swelling which has been there for some time but has gotten worse in past few days. Extubated 11/2. Underwent iHD 11/5.   Per nephrology note, no further HD needed, creatinine stable. Meal completion has been 50-100%, with most meals recently at 100%. Pt reports having a good appetite. Pt has been consuming his Glucerna shake and prostat. RD to continue with current orders.   Labs and medications reviewed.   Diet Order:  Diet renal/carb modified with fluid restriction Diet-HS Snack?: Nothing; Room service appropriate?: Yes; Fluid consistency:: Thin  Skin:  Wound (see comment) (laceration on buttocks, stage II ulcer on coccyx)  Last BM:  11/6  Height:   Ht Readings from Last 1 Encounters:  05/18/15 '5\' 8"'  (1.727 m)    Weight:   Wt Readings from Last 1 Encounters:  05/26/15 161 lb 6 oz (73.2 kg)    Ideal Body Weight:  70 kg  BMI:  Body mass index is 24.54 kg/(m^2).  Estimated Nutritional Needs:   Kcal:  2050-2250  Protein:  95-110 grams  Fluid:  1.2 L/day  EDUCATION NEEDS:   No education needs identified at  this time  Corrin Parker, MS, RD, LDN Pager # [phone] After hours/ weekend pager # [phone]

## 2015-05-28 LAB — GLUCOSE, CAPILLARY
GLUCOSE-CAPILLARY: 89 mg/dL (ref 65–99)
GLUCOSE-CAPILLARY: 114 mg/dL — AB (ref 65–99)
GLUCOSE-CAPILLARY: 95 mg/dL (ref 65–99)
Glucose-Capillary: 68 mg/dL (ref 65–99)
Glucose-Capillary: 57 mg/dL — ABNORMAL LOW (ref 65–99)
Glucose-Capillary: 104 mg/dL — ABNORMAL HIGH (ref 65–99)

## 2015-05-28 LAB — BASIC METABOLIC PANEL
ANION GAP: 5 (ref 5–15)
BUN: 19 mg/dL (ref 6–20)
CALCIUM: 8 mg/dL — AB (ref 8.9–10.3)
CO2: 26 mmol/L (ref 22–32)
CREATININE: 1.29 mg/dL — AB (ref 0.61–1.24)
Chloride: 103 mmol/L (ref 101–111)
GFR calc Af Amer: >60 mL/min (ref >60)
GFR calc non Af Amer: 58 mL/min — ABNORMAL LOW (ref >60)
GLUCOSE: 82 mg/dL (ref 65–99)
Potassium: 4.4 mmol/L (ref 3.5–5.1)
SODIUM: 134 mmol/L — AB (ref 135–145)

## 2015-05-28 LAB — CBC WITH DIFFERENTIAL/PLATELET
Basophils Absolute: 0 10*3/uL (ref 0.0–0.1)
Basophils Relative: 1 %
EOS ABS: 0.8 10*3/uL — AB (ref 0.0–0.7)
Eosinophils Relative: 16 %
HCT: 25.4 % — ABNORMAL LOW (ref 39.0–52.0)
Hemoglobin: 8.5 g/dL — ABNORMAL LOW (ref 13.0–17.0)
LYMPHS ABS: 1.3 10*3/uL (ref 0.7–4.0)
LYMPHS PCT: 29 %
MCH: 35.6 pg — AB (ref 26.0–34.0)
MCHC: 33.5 g/dL (ref 30.0–36.0)
MCV: 106.3 fL — AB (ref 78.0–100.0)
MONO ABS: 0.6 10*3/uL (ref 0.1–1.0)
MONOS PCT: 12 %
Neutro Abs: 1.9 10*3/uL (ref 1.7–7.7)
Neutrophils Relative %: 42 %
PLATELETS: 122 10*3/uL — AB (ref 150–400)
RBC: 2.39 MIL/uL — AB (ref 4.22–5.81)
RDW: 19.6 % — AB (ref 11.5–15.5)
WBC: 4.6 10*3/uL (ref 4.0–10.5)

## 2015-05-28 LAB — CORTISOL-AM, BLOOD: Cortisol - AM: 10.4 ug/dL (ref 6.7–22.6)

## 2015-05-28 MED ORDER — GLUCERNA SHAKE PO LIQD
237.0000 mL | Freq: Three times a day (TID) | ORAL | Status: DC
  Administered 2015-05-28 – 2015-05-29 (×3): 237 mL via ORAL

## 2015-05-28 MED ORDER — GLUCERNA SHAKE PO LIQD: 237.0000 mL | Freq: Three times a day (TID) | ORAL | Status: DC

## 2015-05-28 NOTE — Progress Notes (Signed)
Hypoglycemic Event  CBG: 68  Treatment: 15 GM carbohydrate snack  Symptoms: None  Follow-up CBG: Time:1244 CBG Result:114  Possible Reasons for Event: Inadequate meal intake  Comments/MD notified:Dr. Regalado notified. Will continue to monitor.    [name]

## 2015-05-28 NOTE — Progress Notes (Signed)
[facility] PROGRESS NOTE  [name] TLX:[id number] DOB: May 23, 1956 DOA: 05/03/2015 PCP: Charolette Forward, MD  Brief narrative 59 year old homeless male with history of hypertension, and ST MI, arthritis, anxiety/depression, history of processing abuse with cardiomyopathy (EF of 20%), chronic kidney disease stage IV, hyperlipidemia, history of noncompliance presented to [facility] on 10/16 with neck/facial swelling with hives. He also had intermittent rash and fever. He had transaminitis (AST 299, ALT of 283 and total bili of 3.9) CT of the neck showed asymmetric enlargement of the right second mandibular gland which was nonspecific. He had significant eosinophilia on his white count. Patient was placed on empiric clindamycin and then transitioned to broad-spectrum coverage with vancomycin and Zosyn. Patient's blood cultures was also growing Enterobacter. ID consulted.  a bone marrow biopsy was done to evaluate  eosinophilia and autoimmune workup sent. On 10/26 he went into respiratory distress with septic shock and transferred to ICU requiring intubation...  He also developed shock liver with oliguric renal failure requiring CVVH. Vision self extubated on 11/2. Patient now showing improved urinary output and transferred to [facility] for intermittent dialysis.  SIGNIFICANT EVENTS  10/13 Seen in ER for N/V, sore throat, diarrhea & rash 10/16 Admit to [facility] for facial swelling, cough with frothy yellow sputum, sore throat and hives. 10/26 bone marrow bx done as part of evaluation for eosinophilia. Developed worsening distress later that afternoon. intubated 10/27 cvvh started 10/28-10/31: Bone marrow bx from 26th= ecoli, BCx2 showing Enterobacter. ID following. By 10/30 pressors weaning. Still on CRRT. 10/31 attempting weaning efforts.  10/31: BAL to eval to help explain: unexplained syndrome characterized by fever, hypereosinophilia with high IgE. Primaxin changed to Rocephin by  ID 11/1: sedated. No spontaneous effort during weaning. Fentanyl gtt stopped.  11/2 self extubated 11/4 transferred to [facility] the hospitalist service for need of intermittent dialysis. 11/5: underwent HD 11/6-7 ; good urine output. Holding off further dialysis. 11/8: urinary retention after foley removed. Coude catheter placed    Assessment/Plan: Acute hypoxic respiratory failure Possibly associated with sepsis. Bronchoscopy showing tracheobronchitis. Biopsy growing Escherichia coli. Intubated from 10/26-11/2. Currently stable. Bedside incentive spirometry.  Septic shock with shock liver and oliguric renal failure Sepsis currently resolved. LFTs improving.   Baseline creatinine of 1.4-1.8. Creatinine of 1.3..  Renal sign off.  Urine out put 1.8 L.   Septic shock due to Enterobacter. Required pressors and intubation along with CVVHD. Source unclear. Bronchial secretion on BAL use sensitive Escherichia coli. Also bone marrow biopsy showing Escherichia coli without clear source. On empiric Rocephin. Discussed with Dr. Linus Salmons since pt has issue with homelessness and PICC line is not a good option for now given his renal function, he recommends oral ciprofloxacin 500 mg bid to complete 14 days of abx.  Hypoglycemia;  frequents meals /  Will check cortisol level and insulin level. It might just be from liver failure.    Acute encephalopathy on 11/8  patient restless and confused. Requiring wrist restrain. Suspect sundowning.  Alert, following command. Improved.  Discontinue haldol.   Acute Urinary retention Foley removed on 11/7 but had had outlet obstruction. Has good urine output after the catheter placed in (unable to place in Foley). Will need foley catheter for now with voiding trial possibly as outpt. added flomax  Escherichia coli on bone marrow biopsy No clear source. Unclear if this is contaminant. ID suggesting a possible need for colonoscopy.  Eosinophilia No clear  etiology. Patient on steroid and eosinophilia has resolved. Steroids was  discontinue. Ova and parasite from bronchial lavage negative.  Dilated cardiomyopathy EF of 25-30% with diffuse hypokinesis. Severely reduced biventricular function. Continue aspirin, Coreg, Plavix.  Severe protein -calorie malnutrition Continue supplemental.  Thrombocytopenia Drop in platelets progressively. Stopped pepcid and plavix.  monitor.    Anemia  possibly of chronic disease.  Diet: renal/ diabetic   Code Status: Full code Family Communication: None at bedside Disposition Plan: discharge in 24 hours.    Consultants:  Renal  PCCM  ID  Procedures:  10/13 RUQ [place] - mild gallbladder wall thickening, no gallstones observed 10/16 CXR - no acute infiltrate 10/16 CT Soft tissue Neck - asymmetric enlargement of R submandibular gland relative to the left is non-specific but could be due to infection, inflammation or tumor. Negative for abscess, diffuse subcutaneous edema 10/17 MRI Soft Tissue Neck - motion degraded study, no discrete mass, grossly similar subcutaneous edema involving the bilateral submandibular spaces (nonspecific but must consider infection/cellulitis), no discrete abscess  10/21 CXR - no acute infiltrate 10/21 CT Abd/Pelvis - diffuse body wall edema suggestive of anasarca, mesenteric edema but no overt ascites, no acute intra-abdominal abnormalities, stable prostate gland enlargement and small hiatal hernia 10/26 TTE - LV & RV moderately dilated. EF 25-30% w/ diffuse hypokinesis of LV. Moderate MR. No pericardial effusion. 10/26 Renal US - Medical renal disease. Right pleural effusion. No hydronephrosis. 10/27 CT chest, abd, pelvis - Bibasilar atelectasis/ early infiltrate, anasarca. 10/29 RUQ [place] - no stones. No perichole fluid. CBD 30m. No focal liver lesion. Right pleural effusion. 10/31 bronchoscopy > thick secretions, BAL sent  Antibiotics:              Ciprofloxacin 11/8  until 11/14  Ceftriaxone 10/31--11/8  Vancomycin 10/16-20, 26-27  Primaxin 10/27-10/31  Zosyn 10/17, 10/21, 10/26-10/27  Clindamycin 10/16-10/17  HPI/Subjective: He is alert feeling better  No new complaints.   Objective: Filed Vitals:   05/28/15 0912  BP: 111/73  Pulse: 113  Temp: 98.9 F (37.2 C)  Resp: 20    Intake/Output Summary (Last 24 hours) at 05/28/15 1619 Last data filed at 05/28/15 0603  Gross per 24 hour  Intake    600 ml  Output   1325 ml  Net   -725 ml   Filed Weights   05/24/15 1948 05/25/15 2115 05/26/15 2038  Weight: 72.6 kg (160 lb 0.9 oz) 72.5 kg (159 lb 13.3 oz) 73.2 kg (161 lb 6 oz)    Exam:   General:   no acute distress, confused  HEENT:Pallor+ Temporal wasting, icteric ,moist oral mucosa, rt IJ  Chest: Clear to auscultation bilaterally  CVS: Normal S1 and S2, no murmurs rub or gallop  GI: Soft, non distended ,nontender, bowel sounds present,coude catheter placed.  Musculoskeletal : Warm, no edema  CNS: alert in no distress    Data Reviewed: Basic Metabolic Panel:  Recent Labs Lab 05/22/15 0501 05/24/15 0522 05/25/15 0410 05/26/15 0430 05/27/15 0440 05/28/15 0505  NA 137 137 135 135 136 134*  K 4.6 4.4 4.1 4.1 3.8 4.4  CL 104 101 102 101 104 103  CO2 '29 28 27 26 27 26  ' GLUCOSE 107* 85 92 102* 88 82  BUN 17 18 26* 23* 19 19  CREATININE 1.10 1.70* 1.76* 1.51* 1.30* 1.29*  CALCIUM 8.6* 8.6* 8.2* 8.3* 8.2* 8.0*  MG 2.2  --   --   --   --   --   PHOS 2.9  --   --   --  2.9  --  Liver Function Tests:  Recent Labs Lab 05/22/15 0501 05/24/15 0522 05/26/15 0430 05/27/15 0440 05/27/15 1640  AST  --  93* 76*  --  80*  ALT  --  115* 86*  --  94*  ALKPHOS  --  178* 177*  --  205*  BILITOT  --  5.2* 3.7*  --  3.5*  PROT  --  5.3* 5.2*  --  5.5*  ALBUMIN 2.3* 1.8* 1.8* 1.8* 2.0*   No results for input(s): LIPASE, AMYLASE in the last 168 hours. No results for input(s): AMMONIA in the last 168  hours. CBC:  Recent Labs Lab 05/24/15 0522 05/25/15 0410 05/26/15 0430 05/27/15 0440 05/28/15 0505  WBC 6.1 5.3 4.4 4.3 4.6  NEUTROABS 4.2 3.3 2.7 2.1 1.9  HGB 8.9* 8.5* 8.7* 9.1* 8.5*  HCT 27.1* 25.4* 26.6* 27.4* 25.4*  MCV 104.6* 105.8* 105.1* 107.0* 106.3*  PLT 89* 87* 101* 112* 122*   Cardiac Enzymes: No results for input(s): CKTOTAL, CKMB, CKMBINDEX, TROPONINI in the last 168 hours. BNP (last 3 results)  Recent Labs  04/30/15 1459 05/03/15 1009 05/07/15 1439  BNP 1557.1* 752.1* 1402.2*    ProBNP (last 3 results)  Recent Labs  04/13/15 1012  PROBNP 3040.00*    CBG:  Recent Labs Lab 05/27/15 2008 05/28/15 0733 05/28/15 0805 05/28/15 1146 05/28/15 1244  GLUCAP 71 68 89 57* 114*    Recent Results (from the past 240 hour(s))  Ova and parasite examination     Status: None   Collection Time: 05/21/15 11:31 AM  Result Value Ref Range Status   Specimen Description SPUTUM  Final   Ova and parasites   Final    NO OVA OR PARASITES SEEN MODERATE WBC Performed at [facility]    Report Status 05/25/2015 FINAL  Final     Studies: No results found.  Scheduled Meds: . aspirin  81 mg Oral Daily  . carvedilol  3.125 mg Oral BID WC  . ciprofloxacin  500 mg Oral BID  . feeding supplement (GLUCERNA SHAKE)  237 mL Oral BID BM  . feeding supplement (PRO-STAT SUGAR FREE 64)  30 mL Oral q morning - 10a  . hydrocerin   Topical TID  . sodium chloride  3 mL Intravenous Q12H  . tamsulosin  0.4 mg Oral QPC supper   Continuous Infusions:     Time spent: 25 minutes    Regalado, [place] [facility] Pager [phone]. If 7PM-7AM, please contact night-coverage at [email or web address], password [facility] 05/28/2015, 4:19 PM  LOS: 24 days

## 2015-05-28 NOTE — Progress Notes (Signed)
Hypoglycemic Event  CBG: 68  Treatment: 15 GM carbohydrate snack  Symptoms: None  Follow-up CBG: Time:0805 CBG Result:89  Possible Reasons for Event: Inadequate meal intake  Comments/MD notified:Dr. Regalado notified. Will continue to monitor.    [name]

## 2015-05-29 LAB — GLUCOSE, CAPILLARY: Glucose-Capillary: 77 mg/dL (ref 65–99)

## 2015-05-29 LAB — CBC WITH DIFFERENTIAL/PLATELET
BASOS PCT: 1 %
Basophils Absolute: 0 10*3/uL (ref 0.0–0.1)
Eosinophils Absolute: 0.7 10*3/uL (ref 0.0–0.7)
Eosinophils Relative: 15 %
HEMATOCRIT: 25 % — AB (ref 39.0–52.0)
Hemoglobin: 8.2 g/dL — ABNORMAL LOW (ref 13.0–17.0)
Lymphocytes Relative: 31 %
Lymphs Abs: 1.4 10*3/uL (ref 0.7–4.0)
MCH: 34.9 pg — AB (ref 26.0–34.0)
MCHC: 32.8 g/dL (ref 30.0–36.0)
MCV: 106.4 fL — ABNORMAL HIGH (ref 78.0–100.0)
MONO ABS: 0.6 10*3/uL (ref 0.1–1.0)
MONOS PCT: 14 %
NEUTROS ABS: 1.8 10*3/uL (ref 1.7–7.7)
Neutrophils Relative %: 39 %
Platelets: 126 10*3/uL — ABNORMAL LOW (ref 150–400)
RBC: 2.35 MIL/uL — ABNORMAL LOW (ref 4.22–5.81)
RDW: 19.1 % — AB (ref 11.5–15.5)
WBC: 4.6 10*3/uL (ref 4.0–10.5)

## 2015-05-29 LAB — INSULIN, RANDOM: Insulin: 22.8 u[IU]/mL (ref 2.6–24.9)

## 2015-05-29 MED ORDER — FERROUS SULFATE 325 (65 FE) MG PO TABS: 325.0000 mg | ORAL_TABLET | Freq: Two times a day (BID) | ORAL | Status: DC

## 2015-05-29 MED ORDER — CIPROFLOXACIN HCL 500 MG PO TABS
500.0000 mg | ORAL_TABLET | Freq: Two times a day (BID) | ORAL | Status: DC
Start: 2015-05-29 — End: 2015-06-24

## 2015-05-29 MED ORDER — TAMSULOSIN HCL 0.4 MG PO CAPS: 0.4000 mg | ORAL_CAPSULE | Freq: Every day | ORAL | Status: DC

## 2015-05-29 MED ORDER — GLUCERNA SHAKE PO LIQD: 237.0000 mL | Freq: Three times a day (TID) | ORAL | Status: DC

## 2015-05-29 MED ORDER — FUROSEMIDE 20 MG PO TABS: 20.0000 mg | ORAL_TABLET | Freq: Every day | ORAL | Status: DC

## 2015-05-29 NOTE — Progress Notes (Signed)
Forde Radon to be D/C'd Home per MD order.  Discussed prescriptions and follow up appointments with the patient. Prescriptions given to patient, medication list explained in detail. Pt verbalized understanding.    Medication List    STOP taking these medications        allopurinol 300 MG tablet  Commonly known as:  ZYLOPRIM     atorvastatin 20 MG tablet  Commonly known as:  LIPITOR     atorvastatin 80 MG tablet  Commonly known as:  LIPITOR     colchicine 0.6 MG tablet     digoxin 0.25 MG tablet  Commonly known as:  LANOXIN     diphenhydrAMINE 25 MG tablet  Commonly known as:  BENADRYL     isosorbide mononitrate 30 MG 24 hr tablet  Commonly known as:  IMDUR     isosorbide-hydrALAZINE 20-37.5 MG tablet  Commonly known as:  BIDIL     MYRBETRIQ 25 MG Tb24 tablet  Generic drug:  mirabegron ER     ramipril 1.25 MG capsule  Commonly known as:  ALTACE     ranitidine 150 MG capsule  Commonly known as:  ZANTAC      TAKE these medications        aspirin EC 81 MG tablet  Take 81 mg by mouth daily.     carvedilol 3.125 MG tablet  Commonly known as:  COREG  Take 1 tablet (3.125 mg total) by mouth 2 (two) times daily with a meal. Discontinue 6.25mg      ciprofloxacin 500 MG tablet  Commonly known as:  CIPRO  Take 1 tablet (500 mg total) by mouth 2 (two) times daily.     clopidogrel 75 MG tablet  Commonly known as:  PLAVIX  Take 1 tablet (75 mg total) by mouth daily.     feeding supplement (GLUCERNA SHAKE) Liqd  Take 237 mLs by mouth 3 (three) times daily with meals.     ferrous sulfate 325 (65 FE) MG tablet  Take 1 tablet (325 mg total) by mouth 2 (two) times daily with a meal.     furosemide 20 MG tablet  Commonly known as:  LASIX  Take 1 tablet (20 mg total) by mouth daily.     nitroGLYCERIN 0.4 MG SL tablet  Commonly known as:  NITROSTAT  Place 1 tablet (0.4 mg total) under the tongue every 5 (five) minutes x 3 doses as needed for chest pain.     tamsulosin 0.4 MG Caps capsule  Commonly known as:  FLOMAX  Take 1 capsule (0.4 mg total) by mouth daily after supper.     traMADol 50 MG tablet  Commonly known as:  ULTRAM  Take 1 tablet (50 mg total) by mouth every 12 (twelve) hours as needed.        Filed Vitals:   05/29/15 1000  BP:   Pulse:   Temp: 98.3 F (36.8 C)  Resp:     Skin clean, dry and intact without evidence of skin break down, no evidence of skin tears noted. IV catheter discontinued intact. Site without signs and symptoms of complications. Dressing and pressure applied. Pt denies pain at this time. No complaints noted.  An After Visit Summary was printed and given to the patient. Patient escorted via [place], and D/C home via private auto.  Retta Mac BSN, RN

## 2015-05-29 NOTE — Progress Notes (Signed)
PT Cancellation Note  Patient Details Name: [name] MRN: [id number] DOB: 10/04/1955   Cancelled Treatment:    Reason Eval/Treat Not Completed: Patient declined, no reason specified Politely declines to work with therapy at this time. States he is leaving soon today and would like to rest before leaving. Has no further questions or concerns for therapy. Reports he is very thankful to all staff for the care he received during his admission.  Ellouise Newer 05/29/2015, 2:21 PM Camille Bal Erma, [place]

## 2015-05-29 NOTE — Discharge Summary (Signed)
Physician Discharge Summary  [name] GEX:[id number] DOB: 28-Apr-1956 DOA: 05/03/2015  PCP: Charolette Forward, MD  Admit date: 05/03/2015 Discharge date: 05/29/2015  Time spent: 35 minutes  Recommendations for Outpatient Follow-up:  Needs to follow up with urology, will need voiding trial.  Needs B-met to follow renal function, lasix was resume.  Needs LFT to be repeated.  Follow insulin level.    Discharge Diagnoses:    Sepsis due to cellulitis ([place])   Acute renal failure superimposed on stage 3 chronic kidney disease (HCC)   Chronic combined systolic (congestive) and diastolic (congestive) heart failure (HCC)   Benign essential HTN   History of non-ST elevation myocardial infarction (NSTEMI)   Hyponatremia   Abnormal LFTs   Polysubstance abuse   Noncompliance   Leukocytosis   Cellulitis of neck   Dysphagia   Elevated LFTs   Submandibular gland infection   Urticaria   Rash and nonspecific skin eruption   Dry mouth   Dyspnea   FUO (fever of unknown origin)   Acute respiratory failure (HCC)   Renal failure (ARF), acute on chronic (HCC)   Sepsis ([place])   Endotracheal tube present   Transaminitis   Septic shock ([place])   Multiorgan failure   Gram-negative bacteremia ([place])   Infection caused by Enterobacter cloacae   E coli infection   Eosinophilia   Hyperbilirubinemia   AKI (acute kidney injury) ([place])   Pressure ulcer   Discharge Condition: stable.   Diet recommendation: heart healthy  Filed Weights   05/24/15 1948 05/25/15 2115 05/26/15 2038  Weight: 72.6 kg (160 lb 0.9 oz) 72.5 kg (159 lb 13.3 oz) 73.2 kg (161 lb 6 oz)    History of present illness:  59 year old homeless male with history of hypertension, and ST MI, arthritis, anxiety/depression, history of processing abuse with cardiomyopathy (EF of 20%), chronic kidney disease stage IV, hyperlipidemia, history of noncompliance presented to [facility] on 10/16 with neck/facial swelling with  hives. He also had intermittent rash and fever. He had transaminitis (AST 299, ALT of 283 and total bili of 3.9) CT of the neck showed asymmetric enlargement of the right second mandibular gland which was nonspecific. He had significant eosinophilia on his white count. Patient was placed on empiric clindamycin and then transitioned to broad-spectrum coverage with vancomycin and Zosyn. Patient's blood cultures was also growing Enterobacter. ID consulted. a bone marrow biopsy was done to evaluate eosinophilia and autoimmune workup sent. On 10/26 he went into respiratory distress with septic shock and transferred to ICU requiring intubation... He also developed shock liver with oliguric renal failure requiring CVVH. Vision self extubated on 11/2. Patient now showing improved urinary output and transferred to [facility] for intermittent dialysis.  Hospital Course:  59 year old homeless male with history of hypertension, and ST MI, arthritis, anxiety/depression, history of processing abuse with cardiomyopathy (EF of 20%), chronic kidney disease stage IV, hyperlipidemia, history of noncompliance presented to [facility] on 10/16 with neck/facial swelling with hives. He also had intermittent rash and fever. He had transaminitis (AST 299, ALT of 283 and total bili of 3.9) CT of the neck showed asymmetric enlargement of the right second mandibular gland which was nonspecific. He had significant eosinophilia on his white count. Patient was placed on empiric clindamycin and then transitioned to broad-spectrum coverage with vancomycin and Zosyn. Patient's blood cultures was also growing Enterobacter. ID consulted. a bone marrow biopsy was done to evaluate eosinophilia and autoimmune workup sent. On 10/26 he went into respiratory distress  with septic shock and transferred to ICU requiring intubation... He also developed shock liver with oliguric renal failure requiring CVVH. Vision self extubated on  11/2. Patient now showing improved urinary output and transferred to [facility] for intermittent dialysis.  SIGNIFICANT EVENTS  10/13 Seen in ER for N/V, sore throat, diarrhea & rash 10/16 Admit to [facility] for facial swelling, cough with frothy yellow sputum, sore throat and hives. 10/26 bone marrow bx done as part of evaluation for eosinophilia. Developed worsening distress later that afternoon. intubated 10/27 cvvh started 10/28-10/31: Bone marrow bx from 26th= ecoli, BCx2 showing Enterobacter. ID following. By 10/30 pressors weaning. Still on CRRT. 10/31 attempting weaning efforts.  10/31: BAL to eval to help explain: unexplained syndrome characterized by fever, hypereosinophilia with high IgE. Primaxin changed to Rocephin by ID 11/1: sedated. No spontaneous effort during weaning. Fentanyl gtt stopped.  11/2 self extubated 11/4 transferred to [facility] on the hospitalist service for need of intermittent dialysis. 11/5: underwent HD 11/6-7 ; good urine output. Holding off further dialysis. 11/8: urinary retention after foley removed. Coude catheter placed    Assessment/Plan: Acute hypoxic respiratory failure Possibly associated with sepsis. Bronchoscopy showing tracheobronchitis. Biopsy growing Escherichia coli. Intubated from 10/26-11/2. Currently stable. Bedside incentive spirometry.  Septic shock with shock liver and oliguric renal failure Sepsis currently resolved. LFTs improving.  Baseline creatinine of 1.4-1.8. Creatinine of 1.3..  Renal sign off.  Urine out put 1.8 L.  Resume lower dose of lasix at discharge.   Septic shock due to Enterobacter. Required pressors and intubation along with CVVHD. Source unclear. Bronchial secretion on BAL use sensitive Escherichia coli. Also bone marrow biopsy showing Escherichia coli without clear source. On empiric Rocephin. Discussed with Dr. Linus Salmons since pt has issue with homelessness and PICC line is not a good option for now given his  renal function, he recommends oral ciprofloxacin 500 mg bid to complete 14 days of abx. Will provide 3 more days of antibiotics.   Hypoglycemia;  frequents meals /  cortisol level normal  and insulin level pending. It might just be from liver failure.   Acute encephalopathy on 11/8 patient restless and confused. Requiring wrist restrain. Suspect sundowning.  Alert, following command. Improved.  Discontinue haldol.  Resolved.   Acute Urinary retention Foley removed on 11/7 but had had outlet obstruction. Has good urine output after the catheter placed in (unable to place in Foley). Will need foley catheter for now with voiding trial possibly as outpt. added flomax  Escherichia coli on bone marrow biopsy No clear source. Unclear if this is contaminant. ID suggesting a possible need for colonoscopy.  Eosinophilia No clear etiology. Patient on steroid and eosinophilia has resolved. Steroids was discontinue. Ova and parasite from bronchial lavage negative.  Dilated cardiomyopathy EF of 25-30% with diffuse hypokinesis. Severely reduced biventricular function. Continue aspirin, Coreg, Plavix. Will resume lower dose lasix at discharge./  Severe protein -calorie malnutrition Continue supplemental.  Thrombocytopenia Drop in platelets progressively. Stopped pepcid and plavix. monitor.   Anemia possibly of chronic disease. Discharge on oral iron.   Diet: renal/ diabetic  Antibiotics:  Ciprofloxacin 11/8 until 11/14  Ceftriaxone 10/31--11/8  Vancomycin 10/16-20, 26-27  Primaxin 10/27-10/31  Zosyn 10/17, 10/21, 10/26-10/27  Clindamycin 10/16-10/17   Procedures: 10/13 RUQ [place] - mild gallbladder wall thickening, no gallstones observed 10/16 CXR - no acute infiltrate 10/16 CT Soft tissue Neck - asymmetric enlargement of R submandibular gland relative to the left is non-specific but could be due to infection, inflammation or  tumor. Negative for abscess,  diffuse subcutaneous edema 10/17 MRI Soft Tissue Neck - motion degraded study, no discrete mass, grossly similar subcutaneous edema involving the bilateral submandibular spaces (nonspecific but must consider infection/cellulitis), no discrete abscess  10/21 CXR - no acute infiltrate 10/21 CT Abd/Pelvis - diffuse body wall edema suggestive of anasarca, mesenteric edema but no overt ascites, no acute intra-abdominal abnormalities, stable prostate gland enlargement and small hiatal hernia 10/26 TTE - LV & RV moderately dilated. EF 25-30% w/ diffuse hypokinesis of LV. Moderate MR. No pericardial effusion. 10/26 Renal US - Medical renal disease. Right pleural effusion. No hydronephrosis. 10/27 CT chest, abd, pelvis - Bibasilar atelectasis/ early infiltrate, anasarca. 10/29 RUQ [place] - no stones. No perichole fluid. CBD 26m. No focal liver lesion. Right pleural effusion. 10/31 bronchoscopy > thick secretions, BAL sent  Consultations:  Renal  PCCM  ID  Discharge Exam: Filed Vitals:   05/29/15 1000  BP:   Pulse:   Temp: 98.3 F (36.8 C)  Resp:     General: Alert in no distress Cardiovascular: S 1, S 2 RRR Respiratory: CTA Extremity; trace edema  Discharge Instructions   Discharge Instructions    Diet - low sodium heart healthy    Complete by:  As directed      Increase activity slowly    Complete by:  As directed           Discharge Medication List as of 05/29/2015  2:36 PM    START taking these medications   Details  ciprofloxacin (CIPRO) 500 MG tablet Take 1 tablet (500 mg total) by mouth 2 (two) times daily., Starting 05/29/2015, Until Discontinued, Print    feeding supplement, GLUCERNA SHAKE, (GLUCERNA SHAKE) LIQD Take 237 mLs by mouth 3 (three) times daily with meals., Starting 05/29/2015, Until Discontinued, Print    ferrous sulfate 325 (65 FE) MG tablet Take 1 tablet (325 mg total) by mouth 2 (two) times daily with a meal., Starting 05/29/2015, Until Discontinued,  Print    tamsulosin (FLOMAX) 0.4 MG CAPS capsule Take 1 capsule (0.4 mg total) by mouth daily after supper., Starting 05/29/2015, Until Discontinued, Print      CONTINUE these medications which have CHANGED   Details  furosemide (LASIX) 20 MG tablet Take 1 tablet (20 mg total) by mouth daily., Starting 05/29/2015, Until Discontinued, Print      CONTINUE these medications which have NOT CHANGED   Details  aspirin EC 81 MG tablet Take 81 mg by mouth daily., Until Discontinued, Historical Med    carvedilol (COREG) 3.125 MG tablet Take 1 tablet (3.125 mg total) by mouth 2 (two) times daily with a meal. Discontinue 6.276m Starting 03/24/2015, Until Discontinued, Normal    clopidogrel (PLAVIX) 75 MG tablet Take 1 tablet (75 mg total) by mouth daily., Starting 01/09/2015, Until Discontinued, Normal    nitroGLYCERIN (NITROSTAT) 0.4 MG SL tablet Place 1 tablet (0.4 mg total) under the tongue every 5 (five) minutes x 3 doses as needed for chest pain., Starting 08/16/2014, Until Discontinued, Normal    traMADol (ULTRAM) 50 MG tablet Take 1 tablet (50 mg total) by mouth every 12 (twelve) hours as needed., Starting 03/31/2015, Until Discontinued, Print      STOP taking these medications     atorvastatin (LIPITOR) 80 MG tablet      colchicine 0.6 MG tablet      diphenhydrAMINE (BENADRYL) 25 MG tablet      isosorbide mononitrate (IMDUR) 30 MG 24 hr tablet  isosorbide-hydrALAZINE (BIDIL) 20-37.5 MG per tablet      mirabegron ER (MYRBETRIQ) 25 MG TB24 tablet      ramipril (ALTACE) 1.25 MG capsule      ranitidine (ZANTAC) 150 MG capsule      allopurinol (ZYLOPRIM) 300 MG tablet      atorvastatin (LIPITOR) 20 MG tablet      digoxin (LANOXIN) 0.25 MG tablet        No Known Allergies Follow-up Information    Follow up with Charolette Forward, MD In 1 week.   Specialty:  Cardiology   Contact information:   [address] [place] [address] [phone]         The results of significant diagnostics from this hospitalization (including imaging, microbiology, ancillary and laboratory) are listed below for reference.    Significant Diagnostic Studies: Ct Abdomen Pelvis Wo Contrast  05/14/2015  CLINICAL DATA:  Respiratory failure and abdominal pain EXAM: CT CHEST, ABDOMEN AND PELVIS WITHOUT CONTRAST TECHNIQUE: Multidetector CT imaging of the chest, abdomen and pelvis was performed following the standard protocol without IV contrast. COMPARISON:  05/08/2015, 05/13/2015. FINDINGS: CT CHEST FINDINGS Lungs are well aerated bilaterally but demonstrate bibasilar lower lobe atelectasis/early infiltrate. Some more patchy ground-glass changes are noted throughout both lungs worse on the right than the left. This may represent some mild edema or multifocal infectious process. Thoracic inlet shows evidence of an endotracheal tube at the level of the carina. This probably could be withdrawn 2-3 cm. A nasogastric catheter is noted coiled within the stomach. Cardiac shadow remains enlarged. No significant lymphadenopathy is identified. CT ABDOMEN AND PELVIS FINDINGS The liver and spleen are within normal limits. The gallbladder is decompressed. The adrenal glands and pancreas are unremarkable. Kidneys demonstrate no renal calculi or obstructive changes. No gross mass lesion is seen. A vague hypodensity is noted in the medial aspect of the right kidney which is likely related to artifact as it was not seen on recent renal ultrasound. The appendix is within normal limits. No obstructive changes in the bowel are noted. The bladder is decompressed by Foley catheter. Very mild changes of anasarca are again seen stable from the prior exam. IMPRESSION: Bibasilar atelectasis/ early infiltrate. Additionally some patchy ground-glass infiltrative changes are noted within both lungs worse on the right than the left which may represent asymmetrical edema or infectious process. Endotracheal  tube lies at the level of the carina and should be withdrawn 2-3 cm. The visualized abdomen and pelvis are within normal limits with the exception of some mild changes of anasarca. These results will be called to the ordering clinician or representative by the Radiologist Assistant, and communication documented in the PACS or zVision Dashboard. Electronically Signed   By: Inez Catalina M.D.   On: 05/14/2015 15:23   Ct Abdomen Pelvis Wo Contrast  05/08/2015  CLINICAL DATA:  Abdominal pain, elevated liver function studies. Sepsis and leukocytosis. EXAM: CT ABDOMEN AND PELVIS WITHOUT CONTRAST TECHNIQUE: Multidetector CT imaging of the abdomen and pelvis was performed following the standard protocol without IV contrast. COMPARISON:  CT scan 05/03/2015 and 04/23/2015 FINDINGS: Lower chest: The lung bases are clear. The heart is mildly enlarged. No pericardial effusion. The distal esophagus is grossly normal. Hepatobiliary: No focal hepatic lesions or intrahepatic biliary dilatation. The gallbladder is grossly normal. No common bile duct dilatation. Pancreas: No mass, inflammation or ductal dilatation. Spleen: Normal size.  No focal lesions. Adrenals/Urinary Tract: Stable nodularity of the left adrenal gland, likely small adenomas. The  right adrenal gland is normal. Both kidneys are normal. No renal or obstructing ureteral calculi or bladder calculi. Stomach/Bowel: The stomach, duodenum, small bowel and colon are grossly normal. No inflammatory changes, mass lesions or obstructive findings. The terminal ileum is normal. The appendix is normal. Vascular/Lymphatic: No mesenteric or retroperitoneal mass or adenopathy. Scattered aortic calcifications but no aneurysm. Other: The prostate gland is mildly enlarged. This is stable. The bladder is normal. The the seminal vesicles are normal. No pelvic mass or adenopathy. No free pelvic fluid collections. No inguinal mass or adenopathy. Stable scattered borderline inguinal lymph  nodes. Diffuse subcutaneous soft tissue swelling/ edema/fluid could suggest anasarca. No focal abscess. Musculoskeletal: No significant bony findings. IMPRESSION: 1. Diffuse body wall edema suggesting anasarca. 2. Mesenteric edema but no overt ascites. 3. No acute intra-abdominal/abnormalities. 4. Stable prostate gland enlargement and small hiatal hernia. Electronically Signed   By: Marijo Sanes M.D.   On: 05/08/2015 13:59   Dg Neck Soft Tissue  05/03/2015  CLINICAL DATA:  Neck pain and swelling. EXAM: NECK SOFT TISSUES - 1+ VIEW COMPARISON:  None. FINDINGS: There is no evidence of retropharyngeal soft tissue swelling or epiglottic enlargement. The cervical airway is unremarkable and no radio-opaque foreign body identified. Cervical spine degenerative changes noted. IMPRESSION: No acute findings. Electronically Signed   By: Earle Gell M.D.   On: 05/03/2015 11:06   Dg Chest 2 View  05/03/2015  CLINICAL DATA:  Pt c/o productive cough, SOB and weakness x 2days. HTN, former smoker (quit in 2005). Hx of MI and pneumonia EXAM: CHEST  2 VIEW COMPARISON:  Chest radiograph of 04/30/2015 FINDINGS: Midline trachea. Normal heart size and mediastinal contours. No pleural effusion or pneumothorax. Clear lungs. IMPRESSION: No acute cardiopulmonary disease. Electronically Signed   By: Abigail Miyamoto M.D.   On: 05/03/2015 11:06   Dg Chest 2 View  04/30/2015  CLINICAL DATA:  Cough, sore throat for 2 days EXAM: CHEST  2 VIEW COMPARISON:  03/22/2015 FINDINGS: There is no focal parenchymal opacity. There is no pleural effusion or pneumothorax. There is stable cardiomegaly. The osseous structures are unremarkable. IMPRESSION: No active cardiopulmonary disease. Electronically Signed   By: Kathreen Devoid   On: 04/30/2015 15:35   Ct Soft Tissue Neck Wo Contrast  05/03/2015  CLINICAL DATA:  Throat swelling and productive cough, worsening. Facial swelling. EXAM: CT NECK WITHOUT CONTRAST TECHNIQUE: Multidetector CT imaging of the  neck was performed following the standard protocol without intravenous contrast. COMPARISON:  None. FINDINGS: Lack of IV contrast material limits the exam. There is diffuse infiltration of subcutaneous fatty tissues which may be due to edema or less likely cellulitis. The right submandibular gland is asymmetrically enlarged relative to the left with subtle low attenuation within it and mild haziness of its borders. Major salivary glands are otherwise unremarkable. No lymphadenopathy is seen. No abscess is identified. The lung apices are clear. No lytic or sclerotic bony lesion is seen. Cervical spondylosis is noted. IMPRESSION: Asymmetric enlargement of the right submandibular gland relative to the left is nonspecific but could be due to infection, inflammation or possibly tumor. MRI of the neck with contrast if possible after the patient's acute episode has passed is recommended for further evaluation. Negative for abscess. Diffuse subcutaneous edema is nonspecific and likely due to anasarca rather than cellulitis. Electronically Signed   By: Inge Rise M.D.   On: 05/03/2015 13:18   Ct Chest Wo Contrast  05/14/2015  CLINICAL DATA:  Respiratory failure and abdominal pain EXAM: CT  CHEST, ABDOMEN AND PELVIS WITHOUT CONTRAST TECHNIQUE: Multidetector CT imaging of the chest, abdomen and pelvis was performed following the standard protocol without IV contrast. COMPARISON:  05/08/2015, 05/13/2015. FINDINGS: CT CHEST FINDINGS Lungs are well aerated bilaterally but demonstrate bibasilar lower lobe atelectasis/early infiltrate. Some more patchy ground-glass changes are noted throughout both lungs worse on the right than the left. This may represent some mild edema or multifocal infectious process. Thoracic inlet shows evidence of an endotracheal tube at the level of the carina. This probably could be withdrawn 2-3 cm. A nasogastric catheter is noted coiled within the stomach. Cardiac shadow remains enlarged. No  significant lymphadenopathy is identified. CT ABDOMEN AND PELVIS FINDINGS The liver and spleen are within normal limits. The gallbladder is decompressed. The adrenal glands and pancreas are unremarkable. Kidneys demonstrate no renal calculi or obstructive changes. No gross mass lesion is seen. A vague hypodensity is noted in the medial aspect of the right kidney which is likely related to artifact as it was not seen on recent renal ultrasound. The appendix is within normal limits. No obstructive changes in the bowel are noted. The bladder is decompressed by Foley catheter. Very mild changes of anasarca are again seen stable from the prior exam. IMPRESSION: Bibasilar atelectasis/ early infiltrate. Additionally some patchy ground-glass infiltrative changes are noted within both lungs worse on the right than the left which may represent asymmetrical edema or infectious process. Endotracheal tube lies at the level of the carina and should be withdrawn 2-3 cm. The visualized abdomen and pelvis are within normal limits with the exception of some mild changes of anasarca. These results will be called to the ordering clinician or representative by the Radiologist Assistant, and communication documented in the PACS or zVision Dashboard. Electronically Signed   By: Inez Catalina M.D.   On: 05/14/2015 15:23   Nm Hepatobiliary Including Gb  05/07/2015  CLINICAL DATA:  59 year old male in patient with 2 weeks of abdominal pain. Elevated liver function tests. EXAM: NUCLEAR MEDICINE HEPATOBILIARY IMAGING TECHNIQUE: Sequential images of the abdomen were obtained out to 60 minutes following intravenous administration of radiopharmaceutical. RADIOPHARMACEUTICALS:  7.3 mCi Tc-78m Choletec IV COMPARISON:  05/03/2015 CT abdomen/ pelvis. FINDINGS: There is prompt radiotracer uptake by the liver with normal blood pool clearance. There is prompt radiotracer excretion into the common bile duct and small bowel beginning at 5 minutes.  There is filling of the gallbladder with radiotracer beginning at 30 minutes, indicating cystic duct patency. There is no evidence of a bile leak. IMPRESSION: 1. Normal filling of the gallbladder with radiotracer, indicating cystic duct patency, which is not consistent with acute cholecystitis. 2. Prompt radiotracer excretion into the common bile duct and small bowel, with no evidence of biliary obstruction. 3. Normal liver radiotracer uptake and excretion. Electronically Signed   By: JIlona SorrelM.D.   On: 05/07/2015 09:00   [place]  05/13/2015  CLINICAL DATA:  Chronic renal failure with questionable superimposed acute component EXAM: RENAL / URINARY TRACT ULTRASOUND COMPLETE COMPARISON:  CT abdomen and pelvis May 08, 2015 FINDINGS: Right Kidney: Length: 11.1 cm. Echogenicity is mildly increased. Renal cortical thickness within normal limits. No mass, perinephric fluid, or hydronephrosis visualized. No sonographically demonstrable calculus or ureterectasis. Left Kidney: Length: 10.7 cm. Echogenicity is mildly increased. Renal cortical thickness within within normal limits. No mass, perinephric fluid, or hydronephrosis visualized. No sonographically demonstrable calculus or ureterectasis. Bladder: Appears normal for degree of bladder distention. Incidental note is made of right pleural effusion.  Caryl Never IMPRESSION: Mild increased renal echogenicity, a finding consistent with medical renal disease. Renal cortical thickness normal. No obstructing foci identified. Study otherwise unremarkable except for incidental note of right pleural effusion. Electronically Signed   By: Lowella Grip III M.D.   On: 05/13/2015 21:07   Mr Neck Soft Tissue Only W Wo Contrast  05/04/2015  CLINICAL DATA:  Initial evaluation for throat and facial swelling, productive cough, recently worsened. Patient also with leukocytosis and concern for cellulitis of neck. Evaluate submandibular gland which was question to be  asymmetrically enlarged on prior CT. EXAM: MR NECK SOFT TISSUE ONLY WITHOUT AND WITH CONTRAST TECHNIQUE: Multiplanar, multisequence MR imaging was performed both before and after administration of intravenous contrast. CONTRAST:  38m MULTIHANCE GADOBENATE DIMEGLUMINE 529 MG/ML IV SOLN COMPARISON:  Prior neck CT from 05/03/2015. FINDINGS: Study is degraded by motion artifact. Visualized portions of the brain demonstrate no acute abnormality. Mucosal thickening within the visualized paranasal sinuses. No air-fluid levels to suggest active sinus infection. Mastoid air cells are grossly clear. The salivary glands including the parotid glands and submandibular glands are relatively symmetric and normal in appearance bilaterally. On today's study, the submandibular glands are fairly symmetric in appearance bilaterally, although the right may be slightly larger than the left, felt to be within normal limits for normal size variation. No asymmetric enhancement or focal mass lesion identified within the right submandibular gland to suggest underlying neoplasm. The submandibular glands enhance in a symmetric fashion bilaterally. Scattered edema involving primarily the submandibular spaces bilaterally, similar to findings seen on prior CT. Given the provided history, finding is concerning for possible cellulitis. No drainable fluid collection or abscess. This is better appreciated on prior CT. No significant enhancement. Oral cavity demonstrates a normal appearance. Palatine tonsils normal. Parapharyngeal fat well-preserved. Nasopharynx and oropharynx within normal limits. Epiglottis is normal. Vallecula is clear. Hypopharynx and supraglottic larynx demonstrate no acute abnormality. Thyroid gland grossly unremarkable. No pathologically enlarged lymph nodes identified within the neck. There are 3 mildly prominent level 1A submental nodes measuring up to 7 mm, likely reactive. Mildly prominent left level 2 lymph nodes measure at  the upper limits of normal at approximately 1 cm in short axis. Partially visualized superior mediastinum demonstrates no acute abnormality. Partially visualized lungs are grossly clear. Mild multilevel degenerative changes noted within the visualized cervical spine. Marrow signal intensity within normal limits. Signal intensity within the visualized cord is normal. IMPRESSION: 1. Motion degraded study with no discrete salivary gland mass or other abnormality identified. 2. Grossly similar subcutaneous edema involving primarily the bilateral submandibular spaces. Again, while this finding is nonspecific, possible infection could have this appearance given the history of leukocytosis and concern for cellulitis. This is better evaluated on prior CT. No discrete abscess. Electronically Signed   By: BJeannine BogaM.D.   On: 05/04/2015 22:42   Ct Biopsy  05/12/2015  CLINICAL DATA:  59year old male of uncertain hematologic diagnosis. He has been referred for bone marrow biopsy. EXAM: CT-GUIDED BONE MARROW BIOPSY MEDICATIONS AND MEDICAL HISTORY: Versed 1.5 mg, Fentanyl 50 mcg. Additional Medications: None. ANESTHESIA/SEDATION: Moderate sedation time: 22 minutes PROCEDURE: The procedure risks, benefits, and alternatives were explained to the patient. Questions regarding the procedure were encouraged and answered. The patient understands and consents to the procedure. Scout CT of the pelvis was performed for surgical planning purposes. The posterior pelvis was prepped with Betadinein a sterile fashion, and a sterile drape was applied covering the operative field. A sterile gown and sterile gloves were  used for the procedure. Local anesthesia was provided with 1% Lidocaine. We targeted the right posterior iliac bone for biopsy. The skin and subcutaneous tissues were infiltrated with 1% lidocaine without epinephrine. A small stab incision was made with an 11 blade scalpel, and an 11 gauge Murphy needle was advanced  with CT guidance to the posterior cortex. Manual forced was used to advance the needle through the posterior cortex and the stylet was removed. A bone marrow aspirate was retrieved and passed to a cytotechnologist in the room. The Murphy needle was then advanced without the stylet for a core biopsy. Multiple core biopsy were attempted to be retrieved, with a paucity of marrow retrieved. Core biopsy was sent for pathology and for culture. Manual pressure was used for hemostasis and a sterile dressing was placed. No complications were encountered no significant blood loss was encountered. Patient tolerated the procedure well and remained hemodynamically stable throughout. FINDINGS: Scout image demonstrates safe approach to posterior iliac bone. Images during the case demonstrate placement of 11 gauge Murphy needle COMPLICATIONS: None IMPRESSION: Status post CT-guided bone marrow biopsy, with tissue specimen sent to pathology for complete histopathologic analysis Signed, Dulcy Fanny. Earleen Newport, DO [facility] Electronically Signed   By: Corrie Mckusick D.O.   On: 05/12/2015 13:12   Dg Chest Port 1 View  05/22/2015  CLINICAL DATA:  Septic shock. EXAM: PORTABLE CHEST 1 VIEW COMPARISON:  05/16/2015 FINDINGS: Right jugular temporary dialysis catheter remains in place. Left jugular central line is been removed. Nasogastric tube has been removed. There is no evidence of pulmonary edema, consolidation, pneumothorax, nodule or pleural fluid. The heart size is stable and at the upper limits of normal. IMPRESSION: No active disease. Electronically Signed   By: Aletta Edouard M.D.   On: 05/22/2015 07:18   Dg Chest Port 1 View  05/16/2015  CLINICAL DATA:  Acute respiratory failure. EXAM: PORTABLE CHEST 1 VIEW COMPARISON:  05/15/2015 and 05/14/2015 FINDINGS: Endotracheal tube, NG tube, and left central venous catheter are unchanged. Right sided central venous catheter tip is  in the upper superior vena cava, slightly retracted since the prior study. Slight persistent atelectasis at both lung bases posterior medially. The lungs are otherwise clear. Vascularity is normal. Chronic cardiomegaly. No acute osseous abnormality. IMPRESSION: Slight retraction of the right central venous catheter, now just above the azygos vein confluence in the superior vena cava. Persistent slight atelectasis posterior medially at the bases. Electronically Signed   By: Lorriane Shire M.D.   On: 05/16/2015 07:21   Dg Chest Port 1 View  05/15/2015  CLINICAL DATA:  Respiratory failure and sepsis, intubated patient, CHF, acute and chronic renal failure EXAM: PORTABLE CHEST 1 VIEW COMPARISON:  Portable chest x-ray of May 14, 2015 FINDINGS: The lungs are well-expanded. There is persistent subsegmental atelectasis or pneumonia in the left lower lobe medially. The right lung is clear. The heart is mildly enlarged. The pulmonary vascularity is normal. The endotracheal tube measures 3.5 cm above the carina. The esophagogastric tube tip projects below the inferior margin of the image. The right internal jugular dual-lumen catheter tip projects over the junction of the proximal and mid SVC. The left internal jugular venous catheter tip projects approximately 2 cm inferior to this. IMPRESSION: Persistent left lower lobe subsegmental atelectasis or pneumonia. Stable enlargement of the cardiac silhouette without pulmonary edema. The support tubes are in reasonable position. Electronically Signed   By: David  Martinique M.D.   On: 05/15/2015 07:05  Dg Chest Port 1 View  05/14/2015  CLINICAL DATA:  ETT replacement. EXAM: PORTABLE CHEST 1 VIEW COMPARISON:  Earlier today. FINDINGS: Endotracheal tube tip 2.6 cm above the carina. Right jugular catheter tip in the proximal superior vena cava. Left jugular catheter tip in the mid to distal superior vena cava. Nasogastric tube extending into the stomach. Stable enlarged  cardiac silhouette and mildly prominent pulmonary vasculature and interstitial markings. No pleural fluid. Mild thoracic spine degenerative changes. Bilateral shoulder degenerative changes. IMPRESSION: 1. Endotracheal tube in satisfactory position. 2. Stable cardiomegaly, mild pulmonary vascular congestion and mild chronic interstitial lung disease. Electronically Signed   By: Claudie Revering M.D.   On: 05/14/2015 16:18   Dg Chest Port 1 View  05/14/2015  CLINICAL DATA:  Left IJ line placement.  Initial encounter. EXAM: PORTABLE CHEST 1 VIEW COMPARISON:  Chest radiograph performed 05/13/2015 FINDINGS: The patient's endotracheal tube is seen ending 4 cm above the carina. A right IJ line is noted ending about the mid SVC. A left IJ line is noted ending about the mid to distal SVC. An enteric tube is noted extending below the diaphragm. Mild vascular congestion is noted. No focal consolidation, pleural effusion or pneumothorax is seen. The cardiomediastinal silhouette is mildly enlarged. No acute osseous abnormalities are identified. IMPRESSION: 1. Endotracheal tube seen ending 4 cm above the carina. 2. Left IJ line noted ending about the mid to distal SVC. 3. Mild vascular congestion and mild cardiomegaly. Lungs remain grossly clear. Electronically Signed   By: Garald Balding M.D.   On: 05/14/2015 06:55   Portable Chest Xray  05/13/2015  CLINICAL DATA:  Endotracheal tube placement.  Initial encounter. EXAM: PORTABLE CHEST 1 VIEW COMPARISON:  Chest radiograph performed earlier today at 6:00 p.m. FINDINGS: The patient's endotracheal tube is seen ending 2 cm above the carina. A right IJ line is noted ending about the mid SVC. An enteric tube is noted extending below the diaphragm. Minimal bilateral atelectasis is noted. The lungs are otherwise clear. No pleural effusion or pneumothorax is seen. The cardiomediastinal silhouette is enlarged. No acute osseous abnormalities are identified. IMPRESSION: 1. Endotracheal  tube seen ending 2 cm above the carina. 2. Minimal bilateral atelectasis noted.  Lungs otherwise clear. 3. Cardiomegaly. Electronically Signed   By: Garald Balding M.D.   On: 05/13/2015 23:15   Dg Chest Port 1 View  05/13/2015  CLINICAL DATA:  Acute renal failure and congestive heart failure. Central line placement. EXAM: PORTABLE CHEST 1 VIEW COMPARISON:  05/13/2015 FINDINGS: A new right jugular central venous catheter is seen with tip in the mid SVC. No evidence of pneumothorax. Mild cardiomegaly again noted.  Both lungs are clear. IMPRESSION: New right jugular central venous catheter in appropriate position. No evidence of pneumothorax or other acute findings. Electronically Signed   By: Earle Gell M.D.   On: 05/13/2015 18:17   Dg Chest Port 1 View  05/13/2015  CLINICAL DATA:  59 year old male with sepsis. Shortness of breath and weakness. Initial encounter. EXAM: PORTABLE CHEST 1 VIEW COMPARISON:  Portable AP semi upright view at 1432 hours. 08/06/2014 and earlier. FINDINGS: Portable AP semi upright view at 1432 hours. Stable cardiomegaly and mediastinal contours. Stable lung volumes. Allowing for portable technique, the lungs are clear. No pneumothorax or pleural effusion. IMPRESSION: No acute cardiopulmonary abnormality. Electronically Signed   By: Genevie Ann M.D.   On: 05/13/2015 14:53   Dg Chest Port 1 View  05/08/2015  CLINICAL DATA:  Several day history of productive  cough, shortness of breath and dyspnea. EXAM: PORTABLE CHEST 1 VIEW COMPARISON:  05/03/2015 FINDINGS: The heart is upper limits of normal in size and stable given the AP projection. The mediastinal and hilar contours are normal. The lungs are clear. No pleural effusion. The bony thorax is intact. IMPRESSION: No acute cardiopulmonary findings. Electronically Signed   By: Marijo Sanes M.D.   On: 05/08/2015 10:00   Dg Abd Portable 2v  05/13/2015  CLINICAL DATA:  Vomiting and generalized abdominal pain. EXAM: PORTABLE ABDOMEN - 2  VIEW COMPARISON:  CT 05/08/2015 FINDINGS: Negative for free air on the left lateral decubitus image. Gaseous distention of stomach with an air-fluid level. There is evidence for gas and stool in the colon. Nonobstructive bowel gas pattern. IMPRESSION: Gaseous distension of the stomach, otherwise, unremarkable abdominal examination. Electronically Signed   By: Markus Daft M.D.   On: 05/13/2015 16:42   Ct Renal Stone Study  05/03/2015  CLINICAL DATA:  Mid abdominal pain. Elevated liver function tests. Renal failure. EXAM: CT ABDOMEN AND PELVIS WITHOUT CONTRAST TECHNIQUE: Multidetector CT imaging of the abdomen and pelvis was performed following the standard protocol without IV contrast. COMPARISON:  04/23/2015 FINDINGS: Lower chest:  No acute findings. Hepatobiliary: No mass visualized on this un-enhanced exam. Gallbladder is unremarkable. No evidence of biliary ductal dilatation on this unenhanced exam. Pancreas: No mass or inflammatory process identified on this un-enhanced exam. Spleen: Within normal limits in size. Adrenals/Urinary Tract: No evidence of urolithiasis or hydronephrosis. Mild bilateral perinephric stranding is nonspecific and stable in appearance since previous study. Stomach/Bowel: No evidence of obstruction, inflammatory process, or abnormal fluid collections. Normal appendix visualized. Vascular/Lymphatic: No pathologically enlarged lymph nodes. No evidence of abdominal aortic aneurysm. Reproductive: Stable moderately enlarged prostate Other: None. Musculoskeletal:  No suspicious bone lesions identified. IMPRESSION: No evidence of urolithiasis, hydronephrosis, or other acute findings. Stable moderately enlarged prostate. Electronically Signed   By: Earle Gell M.D.   On: 05/03/2015 13:13   US Abdomen Limited Ruq  05/16/2015  CLINICAL DATA:  Transaminitis EXAM: US ABDOMEN LIMITED - RIGHT UPPER QUADRANT COMPARISON:  CT abdomen pelvis dated 05/14/2015 FINDINGS: Gallbladder: No gallstones,  gallbladder wall thickening, or pericholecystic fluid. Sonographic Murphy's sign could not be assessed due to patient responsiveness. Common bile duct: Diameter: 3 mm Liver: No focal lesion identified. Within normal limits in parenchymal echogenicity. Additional comments: Right pleural effusion. IMPRESSION: Negative right upper quadrant ultrasound. Electronically Signed   By: Julian Hy M.D.   On: 05/16/2015 11:33   US Abdomen Limited Ruq  04/30/2015  CLINICAL DATA:  Elevated liver function tests. EXAM: US ABDOMEN LIMITED - RIGHT UPPER QUADRANT COMPARISON:  04/23/2015 FINDINGS: Gallbladder: Mildly thickened gallbladder wall at 3 mm but probably due to nondistention given the small caliber of the gallbladder. Sonographic Murphy sign indeterminate due to pain medication. No discrete gallstone identified. Common bile duct: Diameter: 3 mm Liver: No focal lesion identified. Within normal limits in parenchymal echogenicity. IMPRESSION: 1. The mild gallbladder wall thickening is probably attributable 2 nondistention/contraction. No gallstones are observed. Sonographic Murphy's sign is technically indeterminate due to pain medication. Electronically Signed   By: Van Clines M.D.   On: 04/30/2015 18:57    Microbiology: Recent Results (from the past 240 hour(s))  Ova and parasite examination     Status: None   Collection Time: 05/21/15 11:31 AM  Result Value Ref Range Status   Specimen Description SPUTUM  Final   Ova and parasites   Final    NO OVA  OR PARASITES SEEN MODERATE WBC Performed at [facility]    Report Status 05/25/2015 FINAL  Final     Labs: Basic Metabolic Panel:  Recent Labs Lab 05/24/15 0522 05/25/15 0410 05/26/15 0430 05/27/15 0440 05/28/15 0505  NA 137 135 135 136 134*  K 4.4 4.1 4.1 3.8 4.4  CL 101 102 101 104 103  CO2 _0 GLUCOSE 85 92 102* 88 82  BUN 18 26* 23* 19 19  CREATININE 1.70* 1.76* 1.51* 1.30* 1.29*  CALCIUM 8.6* 8.2* 8.3*  8.2* 8.0*  PHOS  --   --   --  2.9  --    Liver Function Tests:  Recent Labs Lab 05/24/15 0522 05/26/15 0430 05/27/15 0440 05/27/15 1640  AST 93* 76*  --  80*  ALT 115* 86*  --  94*  ALKPHOS 178* 177*  --  205*  BILITOT 5.2* 3.7*  --  3.5*  PROT 5.3* 5.2*  --  5.5*  ALBUMIN 1.8* 1.8* 1.8* 2.0*   No results for input(s): LIPASE, AMYLASE in the last 168 hours. No results for input(s): AMMONIA in the last 168 hours. CBC:  Recent Labs Lab 05/25/15 0410 05/26/15 0430 05/27/15 0440 05/28/15 0505 05/29/15 0315  WBC 5.3 4.4 4.3 4.6 4.6  NEUTROABS 3.3 2.7 2.1 1.9 1.8  HGB 8.5* 8.7* 9.1* 8.5* 8.2*  HCT 25.4* 26.6* 27.4* 25.4* 25.0*  MCV 105.8* 105.1* 107.0* 106.3* 106.4*  PLT 87* 101* 112* 122* 126*   Cardiac Enzymes: No results for input(s): CKTOTAL, CKMB, CKMBINDEX, TROPONINI in the last 168 hours. BNP: BNP (last 3 results)  Recent Labs  04/30/15 1459 05/03/15 1009 05/07/15 1439  BNP 1557.1* 752.1* 1402.2*    ProBNP (last 3 results)  Recent Labs  04/13/15 1012  PROBNP 3040.00*    CBG:  Recent Labs Lab 05/28/15 1146 05/28/15 1244 05/28/15 1639 05/28/15 2120 05/29/15 0759  GLUCAP 57* 114* 95 104* 77       Signed:  Rosina Cressler A  [facility] 05/29/2015, 3:24 PM

## 2015-06-04 NOTE — Progress Notes (Signed)
06/04/15 at 1130-This Case Manager met with patient to ensure patient aware of his upcoming hospital follow-up appointment on 06/08/15 at 1600 with Dr. Jarold Song. Patient aware of appointment.  Also inquired if patient had obtained his discharge medications. Patient indicated he had not yet obtained his medications. Informed patient he could pick up his medications from [facility] and [place]; however, patient indicated he planned to get his medications from the [facility] near his home because it was much more convenient for him.  Informed patient he needs to ensure he gets his medications today since he was discharged on 05/29/15, and it is very important that he take his medications as prescribed. Patient verbalized understanding.  Patient waiting on cab. No additional needs/concerns identified.

## 2015-06-08 ENCOUNTER — Inpatient Hospital Stay: Payer: Medicaid Other | Admitting: Family Medicine

## 2015-06-09 LAB — FUNGUS CULTURE W SMEAR: Fungal Smear: NONE SEEN

## 2015-06-15 ENCOUNTER — Telehealth: Payer: Self-pay

## 2015-06-15 LAB — FUNGUS CULTURE W SMEAR
Fungal Smear: NONE SEEN
Special Requests: NORMAL

## 2015-06-15 NOTE — Telephone Encounter (Signed)
-----   Message from Nila Nephew, RN sent at 05/12/2015 12:39 PM EDT -----   ----- Message -----    From: [name], MD    Sent: 04/14/2015   8:44 AM      To: Esperanza Sheets, RN  Please advise him to remain on Lasix 60mg  bid as his labs reveal evidence of acute CHF

## 2015-06-15 NOTE — Telephone Encounter (Signed)
CMA called patient, patient does not have a phone. I left message with patient brother, Orvile Cotterill to have Ronnald to call me back tomorrow, 11/29 before 1:00 due to me leaving work early. I advised patient brother that if i don't hear from him before 1pm tomorrow, it want be until Wednesday, 11/30 before I'm able to call him back. Patient brother said he would deliver the message.

## 2015-06-16 ENCOUNTER — Ambulatory Visit: Payer: Medicaid Other | Attending: Family Medicine | Admitting: Family Medicine

## 2015-06-16 ENCOUNTER — Encounter: Payer: Self-pay | Admitting: Family Medicine

## 2015-06-16 VITALS — BP 113/68 | HR 87 | Temp 98.1°F | Resp 13 | Ht 68.5 in | Wt 159.8 lb

## 2015-06-16 DIAGNOSIS — R740 Nonspecific elevation of levels of transaminase and lactic acid dehydrogenase [LDH]: Secondary | ICD-10-CM | POA: Insufficient documentation

## 2015-06-16 DIAGNOSIS — Z992 Dependence on renal dialysis: Secondary | ICD-10-CM | POA: Insufficient documentation

## 2015-06-16 DIAGNOSIS — E7800 Pure hypercholesterolemia, unspecified: Secondary | ICD-10-CM | POA: Diagnosis not present

## 2015-06-16 DIAGNOSIS — N179 Acute kidney failure, unspecified: Secondary | ICD-10-CM | POA: Insufficient documentation

## 2015-06-16 DIAGNOSIS — I129 Hypertensive chronic kidney disease with stage 1 through stage 4 chronic kidney disease, or unspecified chronic kidney disease: Secondary | ICD-10-CM | POA: Diagnosis not present

## 2015-06-16 DIAGNOSIS — N139 Obstructive and reflux uropathy, unspecified: Secondary | ICD-10-CM | POA: Insufficient documentation

## 2015-06-16 DIAGNOSIS — L03211 Cellulitis of face: Secondary | ICD-10-CM | POA: Insufficient documentation

## 2015-06-16 DIAGNOSIS — R509 Fever, unspecified: Secondary | ICD-10-CM | POA: Diagnosis not present

## 2015-06-16 DIAGNOSIS — R339 Retention of urine, unspecified: Secondary | ICD-10-CM | POA: Insufficient documentation

## 2015-06-16 DIAGNOSIS — I255 Ischemic cardiomyopathy: Secondary | ICD-10-CM | POA: Diagnosis not present

## 2015-06-16 DIAGNOSIS — N183 Chronic kidney disease, stage 3 (moderate): Secondary | ICD-10-CM

## 2015-06-16 DIAGNOSIS — I10 Essential (primary) hypertension: Secondary | ICD-10-CM

## 2015-06-16 DIAGNOSIS — D721 Eosinophilia, unspecified: Secondary | ICD-10-CM

## 2015-06-16 DIAGNOSIS — N184 Chronic kidney disease, stage 4 (severe): Secondary | ICD-10-CM | POA: Insufficient documentation

## 2015-06-16 DIAGNOSIS — F419 Anxiety disorder, unspecified: Secondary | ICD-10-CM | POA: Insufficient documentation

## 2015-06-16 DIAGNOSIS — R21 Rash and other nonspecific skin eruption: Secondary | ICD-10-CM | POA: Diagnosis not present

## 2015-06-16 DIAGNOSIS — R7881 Bacteremia: Secondary | ICD-10-CM | POA: Diagnosis not present

## 2015-06-16 DIAGNOSIS — I420 Dilated cardiomyopathy: Secondary | ICD-10-CM | POA: Diagnosis not present

## 2015-06-16 DIAGNOSIS — I509 Heart failure, unspecified: Secondary | ICD-10-CM | POA: Insufficient documentation

## 2015-06-16 DIAGNOSIS — Z590 Homelessness: Secondary | ICD-10-CM | POA: Insufficient documentation

## 2015-06-16 DIAGNOSIS — F329 Major depressive disorder, single episode, unspecified: Secondary | ICD-10-CM | POA: Diagnosis not present

## 2015-06-16 DIAGNOSIS — Z7982 Long term (current) use of aspirin: Secondary | ICD-10-CM | POA: Insufficient documentation

## 2015-06-16 DIAGNOSIS — I2510 Atherosclerotic heart disease of native coronary artery without angina pectoris: Secondary | ICD-10-CM | POA: Insufficient documentation

## 2015-06-16 DIAGNOSIS — Z87891 Personal history of nicotine dependence: Secondary | ICD-10-CM | POA: Insufficient documentation

## 2015-06-16 DIAGNOSIS — Z79899 Other long term (current) drug therapy: Secondary | ICD-10-CM | POA: Insufficient documentation

## 2015-06-16 DIAGNOSIS — I252 Old myocardial infarction: Secondary | ICD-10-CM | POA: Diagnosis not present

## 2015-06-16 DIAGNOSIS — I5042 Chronic combined systolic (congestive) and diastolic (congestive) heart failure: Secondary | ICD-10-CM | POA: Diagnosis not present

## 2015-06-16 NOTE — Progress Notes (Signed)
Subjective:    Patient ID: [name], [name]    DOB: 28-Oct-1955, 59 y.o.   MRN: [id number]  HPI 59 year old [name] with a history of hypertension, CHF (EF 25-30%), coronary artery disease, CKD, hyperlipidemia who was recently hospitalized from 05/03/15-05/29/15 at [facility] for sepsis secondary to Enterobacter.  He had initially presented to [facility] with neck and facial cellulitis as well as hives, fever, rash and was found to have transaminitis(AST 299, ALT of 283 and total bili of 3.9) CT of the neck showed asymmetric enlargement of the right second mandibular gland which was nonspecific. He had significant eosinophilia. He was placed on clindamycin which was transitioned to vancomycin and Zosyn and subsequently Primaxin and ceftriaxone and was also on IV steroids. Blood cultures were positive for Enterobacter. He was closely followed by infectious disease and had a bone marrow biopsy to evaluate eosinophilia which revealed Escherichia coli. He went into respiratory distress with septic shock requiring intubation and ICU admission; he also developed multisystem organ failure with oliguric renal failure requiring CVVH. He had a broncheoaveolar lavage to help explain eosinophilia which was negative for acid-fast bacilli.  He was transferred to [facility] for intermittent dialysis after which he subsequently developed urinary retention for which a catheter was placed. His condition subsequently improved and he was later discharged on oral ciprofloxacin with recommendations to follow-up with urology outpatient.   Interval history: He complains of having too many medications to take and is confused as to what he should be taking. He does not have any of his medications with him but states he has a bag at home and a couple of others at the pharmacy. He also complains of "shedding of his skin"which started when he was hospitalized along with pruritus. He is not happy about the  Foley which he has to carry around with him because it is embarrassing and has been to see the urologist since discharge with an upcoming appointment next week; he sometimes notices bleeding at the tip of his penis and at other times purulent discharge.  Past Medical History  Diagnosis Date  . Hypertension   . Homelessness   . Urinary hesitancy   . Hypercholesterolemia   . NSTEMI (non-ST elevated myocardial infarction) ([place]) 01/07/2015  . Heart attack ([place]) 07/2014  . Walking pneumonia 07/2014  . Headache     "probably weekly" (01/07/2015)  . Arthritis     "all over" (01/07/2015)  . Anxiety   . Depression   . Noncompliance   . Polysubstance abuse     etoh, cocaine  . Ischemic dilated cardiomyopathy   . CKD (chronic kidney disease), stage IV ([place])   . History of echocardiogram 12/2014    EF 20-25%    Past Surgical History  Procedure Laterality Date  . Left heart catheterization with coronary angiogram N/A 08/15/2014    Procedure: LEFT HEART CATHETERIZATION WITH CORONARY ANGIOGRAM;  Surgeon: Clent Demark, MD;  Location: [facility] CATH LAB;  Service: Cardiovascular;  Laterality: N/A;  . Cardiac catheterization      Social History   Social History  . Marital Status: Single    Spouse Name: N/A  . Number of Children: N/A  . Years of Education: N/A   Occupational History  . Not on file.   Social History Main Topics  . Smoking status: Former Smoker -- 1.50 packs/day for 30 years    Types: Cigarettes    Quit date: 02/19/2004  . Smokeless tobacco: Never Used  . Alcohol  Use: Yes     Comment: social  . Drug Use: No     Comment: former  . Sexual Activity: No     Comment: crack   Other Topics Concern  . Not on file   Social History Narrative    No Known Allergies  Current Outpatient Prescriptions on File Prior to Visit  Medication Sig Dispense Refill  . aspirin EC 81 MG tablet Take 81 mg by mouth daily.    . carvedilol (COREG) 3.125 MG tablet Take 1 tablet (3.125 mg total)  by mouth 2 (two) times daily with a meal. Discontinue 6.50m (Patient not taking: Reported on 06/16/2015) 60 tablet 1  . ciprofloxacin (CIPRO) 500 MG tablet Take 1 tablet (500 mg total) by mouth 2 (two) times daily. (Patient not taking: Reported on 06/16/2015) 6 tablet 0  . clopidogrel (PLAVIX) 75 MG tablet Take 1 tablet (75 mg total) by mouth daily. (Patient not taking: Reported on 06/16/2015) 30 tablet 3  . feeding supplement, GLUCERNA SHAKE, (GLUCERNA SHAKE) LIQD Take 237 mLs by mouth 3 (three) times daily with meals. (Patient not taking: Reported on 06/16/2015) 30 Can 0  . ferrous sulfate 325 (65 FE) MG tablet Take 1 tablet (325 mg total) by mouth 2 (two) times daily with a meal. (Patient not taking: Reported on 06/16/2015) 60 tablet 0  . furosemide (LASIX) 20 MG tablet Take 1 tablet (20 mg total) by mouth daily. (Patient not taking: Reported on 06/16/2015) 30 tablet 0  . nitroGLYCERIN (NITROSTAT) 0.4 MG SL tablet Place 1 tablet (0.4 mg total) under the tongue every 5 (five) minutes x 3 doses as needed for chest pain. (Patient not taking: Reported on 06/16/2015) 25 tablet 1  . tamsulosin (FLOMAX) 0.4 MG CAPS capsule Take 1 capsule (0.4 mg total) by mouth daily after supper. (Patient not taking: Reported on 06/16/2015) 30 capsule 0  . traMADol (ULTRAM) 50 MG tablet Take 1 tablet (50 mg total) by mouth every 12 (twelve) hours as needed. (Patient not taking: Reported on 06/16/2015) 30 tablet 0   No current facility-administered medications on file prior to visit.      Review of Systems  Constitutional: Negative for activity change and appetite change.  HENT: Negative for sinus pressure and sore throat.   Eyes: Negative for visual disturbance.  Respiratory: Negative for cough, chest tightness and shortness of breath.   Cardiovascular: Negative for chest pain and leg swelling.  Gastrointestinal: Negative for abdominal pain, diarrhea, constipation and abdominal distention.  Endocrine: Negative.     Genitourinary: Negative for dysuria.  Musculoskeletal: Negative for myalgias and joint swelling.  Skin: Negative for rash.  Allergic/Immunologic: Negative.   Neurological: Negative for weakness, light-headedness and numbness.  Psychiatric/Behavioral: Negative for suicidal ideas and dysphoric mood.       Objective: Filed Vitals:   06/16/15 1150 06/16/15 1151  BP: 167/99 113/68  Pulse: 60 87  Temp: 97.9 F (36.6 C) 98.1 F (36.7 C)  Resp: 12 13  Height: _0  (1.854 m) 5' 8.5" (1.74 m)  Weight: 166 lb (75.297 kg) 159 lb 12.8 oz (72.485 kg)  SpO2: 99% 100%      Physical Exam  Constitutional: He is oriented to person, place, and time. He appears well-developed and well-nourished.  HENT:  Head: Normocephalic and atraumatic.  Right Ear: External ear normal.  Left Ear: External ear normal.  Eyes: Conjunctivae and EOM are normal. Pupils are equal, round, and reactive to light.  Neck: Normal range of motion. Neck supple. No tracheal  deviation present.  Cardiovascular: Normal rate, regular rhythm and normal heart sounds.   No murmur heard. Pulmonary/Chest: Effort normal and breath sounds normal. No respiratory distress. He has no wheezes. He exhibits no tenderness.  Abdominal: Soft. Bowel sounds are normal. He exhibits no mass. There is no tenderness.  Genitourinary:  Foley catheter in situ  Musculoskeletal: Normal range of motion. He exhibits edema (1+ bilateral nonpitting pedal edema). He exhibits no tenderness.  Neurological: He is alert and oriented to person, place, and time.  Skin: Skin is warm and dry. Rash (Diffuse rash in healing phase noted on anterior and posterior trunk; bilateral palms with excoriation) noted.  Psychiatric: He has a normal mood and affect.          Assessment & Plan:  59 year old [name] with a history of dilated cardiomyopathy, hypertension with recent hospitalization for septic shock secondary to Enterobacter requiring intermittent dialysis and now  having urinary retention with a Foley catheter in place.  Eosinophilia: Resolved CMET and CBC ordered but patient left without having labs done.  Rash Unknown etiology. Advised to use adequate moisturizers like Vaseline.  Hypertension: Initial blood pressure was elevated at 167/99 repeat BP manually normalized at 113/68  Dilated cardiomyopathy: EF of 25-30%. Remains on Lasix; will need a potassium level will. His Cardiologist is Dr Charolette Forward and he states he has a follow-up appointment only is unsure of the exact date.  Renal failure and Obstructive uropathy: Foley catheter in place. Continue Flomax. Keep appointment with Urology  This note has been created with Dragon speech recognition software and Engineer, materials. Any transcriptional errors are unintentional.

## 2015-06-16 NOTE — Progress Notes (Signed)
States he is here to establish care He has foley catheter He says he is not taking his medications because he does not know what to take He was in hospital to for sepsis secondary to cellulitis He needs to follow up with urology

## 2015-06-17 NOTE — Telephone Encounter (Signed)
Letter will be sent to patient address on file. Awaiting patient to return my call once he receive the letter in the mail.    CMA called patient, patient does not have a phone. I left message with patient brother, Sunao Fairweather to have Orest to call me back tomorrow, 11/29 before 1:00 due to me leaving work early. I advised patient brother that if i don't hear from him before 1pm tomorrow, it want be until Wednesday, 11/30 before I'm able to call him back. Patient brother said he would deliver the message.        [employer], [place] Certified Medical Assistant Signed  Service date: 06/15/2015 5:12 PM       Expand All Collapse All    ----- Message from Nila Nephew, RN sent at 05/12/2015 12:39 PM EDT -----  ----- Message -----  From: [name], MD  Sent: 04/14/2015 8:44 AM  To: Esperanza Sheets, RN  Please advise him to remain on Lasix 60mg  bid as his labs reveal evidence of acute CHF           [employer], [place] Certified Medical Assistant Signed  Service date: 06/15/2015 5:11 PM       Expand All Collapse All    ----- Message from Nila Nephew, RN sent at 05/12/2015 12:39 PM EDT -----  ----- Message -----  From: [name], MD  Sent: 04/14/2015 8:44 AM  To: Esperanza Sheets, RN  Please advise him to remain on Lasix 60mg  bid as his labs reveal evidence of acute CHF

## 2015-06-19 ENCOUNTER — Ambulatory Visit (HOSPITAL_COMMUNITY)
Admission: RE | Admit: 2015-06-19 | Discharge: 2015-06-19 | Disposition: A | Discharge status: Home / Self Care | Payer: Medicaid Other | Source: Ambulatory Visit | Attending: Urology | Admitting: Urology

## 2015-06-19 DIAGNOSIS — D49519 Neoplasm of unspecified behavior of unspecified kidney: Secondary | ICD-10-CM | POA: Diagnosis present

## 2015-06-19 DIAGNOSIS — N281 Cyst of kidney, acquired: Secondary | ICD-10-CM | POA: Diagnosis not present

## 2015-06-19 DIAGNOSIS — N289 Disorder of kidney and ureter, unspecified: Secondary | ICD-10-CM | POA: Diagnosis not present

## 2015-06-19 LAB — POCT I-STAT CREATININE: Creatinine, Ser: 1.6 mg/dL — ABNORMAL HIGH (ref 0.61–1.24)

## 2015-06-19 MED ORDER — GADOBENATE DIMEGLUMINE 529 MG/ML IV SOLN
15.0000 mL | Freq: Once | INTRAVENOUS | Status: AC | PRN
  Administered 2015-06-19: 15 mL via INTRAVENOUS

## 2015-06-24 ENCOUNTER — Emergency Department (HOSPITAL_COMMUNITY)
Admission: EM | Admit: 2015-06-24 | Discharge: 2015-06-24 | Disposition: A | Discharge status: Home / Self Care | Payer: Medicaid Other | Attending: Emergency Medicine | Admitting: Emergency Medicine

## 2015-06-24 ENCOUNTER — Encounter (HOSPITAL_COMMUNITY): Payer: Self-pay | Admitting: Emergency Medicine

## 2015-06-24 ENCOUNTER — Other Ambulatory Visit: Payer: Self-pay | Admitting: Family Medicine

## 2015-06-24 ENCOUNTER — Telehealth: Payer: Self-pay

## 2015-06-24 DIAGNOSIS — I252 Old myocardial infarction: Secondary | ICD-10-CM | POA: Insufficient documentation

## 2015-06-24 DIAGNOSIS — Z8659 Personal history of other mental and behavioral disorders: Secondary | ICD-10-CM | POA: Diagnosis not present

## 2015-06-24 DIAGNOSIS — I129 Hypertensive chronic kidney disease with stage 1 through stage 4 chronic kidney disease, or unspecified chronic kidney disease: Secondary | ICD-10-CM | POA: Insufficient documentation

## 2015-06-24 DIAGNOSIS — Z79899 Other long term (current) drug therapy: Secondary | ICD-10-CM | POA: Insufficient documentation

## 2015-06-24 DIAGNOSIS — R21 Rash and other nonspecific skin eruption: Secondary | ICD-10-CM | POA: Diagnosis not present

## 2015-06-24 DIAGNOSIS — Z87891 Personal history of nicotine dependence: Secondary | ICD-10-CM | POA: Diagnosis not present

## 2015-06-24 DIAGNOSIS — Z9119 Patient's noncompliance with other medical treatment and regimen: Secondary | ICD-10-CM | POA: Insufficient documentation

## 2015-06-24 DIAGNOSIS — N184 Chronic kidney disease, stage 4 (severe): Secondary | ICD-10-CM | POA: Insufficient documentation

## 2015-06-24 DIAGNOSIS — M1990 Unspecified osteoarthritis, unspecified site: Secondary | ICD-10-CM | POA: Insufficient documentation

## 2015-06-24 DIAGNOSIS — Z7902 Long term (current) use of antithrombotics/antiplatelets: Secondary | ICD-10-CM | POA: Insufficient documentation

## 2015-06-24 DIAGNOSIS — Z590 Homelessness: Secondary | ICD-10-CM | POA: Insufficient documentation

## 2015-06-24 DIAGNOSIS — Z9889 Other specified postprocedural states: Secondary | ICD-10-CM | POA: Insufficient documentation

## 2015-06-24 DIAGNOSIS — N390 Urinary tract infection, site not specified: Secondary | ICD-10-CM

## 2015-06-24 DIAGNOSIS — Z8701 Personal history of pneumonia (recurrent): Secondary | ICD-10-CM | POA: Diagnosis not present

## 2015-06-24 DIAGNOSIS — M7989 Other specified soft tissue disorders: Secondary | ICD-10-CM | POA: Diagnosis not present

## 2015-06-24 DIAGNOSIS — Z7982 Long term (current) use of aspirin: Secondary | ICD-10-CM | POA: Diagnosis not present

## 2015-06-24 DIAGNOSIS — R8290 Unspecified abnormal findings in urine: Secondary | ICD-10-CM | POA: Diagnosis present

## 2015-06-24 DIAGNOSIS — E7800 Pure hypercholesterolemia, unspecified: Secondary | ICD-10-CM | POA: Diagnosis not present

## 2015-06-24 LAB — COMPREHENSIVE METABOLIC PANEL
ALT: 47 U/L (ref 17–63)
AST: 63 U/L — AB (ref 15–41)
Albumin: 2.5 g/dL — ABNORMAL LOW (ref 3.5–5.0)
Alkaline Phosphatase: 165 U/L — ABNORMAL HIGH (ref 38–126)
Anion gap: 7 (ref 5–15)
BILIRUBIN TOTAL: 2.1 mg/dL — AB (ref 0.3–1.2)
BUN: 12 mg/dL (ref 6–20)
CALCIUM: 8.5 mg/dL — AB (ref 8.9–10.3)
CO2: 24 mmol/L (ref 22–32)
CREATININE: 1.39 mg/dL — AB (ref 0.61–1.24)
Chloride: 104 mmol/L (ref 101–111)
GFR, EST NON AFRICAN AMERICAN: 53 mL/min — AB (ref >60)
Glucose, Bld: 91 mg/dL (ref 65–99)
Potassium: 3.7 mmol/L (ref 3.5–5.1)
Sodium: 135 mmol/L (ref 135–145)
TOTAL PROTEIN: 7 g/dL (ref 6.5–8.1)

## 2015-06-24 LAB — CBC
HCT: 29.4 % — ABNORMAL LOW (ref 39.0–52.0)
Hemoglobin: 9.5 g/dL — ABNORMAL LOW (ref 13.0–17.0)
MCH: 33.9 pg (ref 26.0–34.0)
MCHC: 32.3 g/dL (ref 30.0–36.0)
MCV: 105 fL — ABNORMAL HIGH (ref 78.0–100.0)
PLATELETS: 329 10*3/uL (ref 150–400)
RBC: 2.8 MIL/uL — AB (ref 4.22–5.81)
RDW: 15.6 % — AB (ref 11.5–15.5)
WBC: 11.6 10*3/uL — AB (ref 4.0–10.5)

## 2015-06-24 LAB — AFB CULTURE WITH SMEAR (NOT AT ARMC): Acid Fast Smear: NONE SEEN

## 2015-06-24 LAB — URINALYSIS, ROUTINE W REFLEX MICROSCOPIC
Bilirubin Urine: NEGATIVE
GLUCOSE, UA: NEGATIVE mg/dL
KETONES UR: NEGATIVE mg/dL
NITRITE: POSITIVE — AB
PROTEIN: 30 mg/dL — AB
Specific Gravity, Urine: 1.011 (ref 1.005–1.030)
pH: 7 (ref 5.0–8.0)

## 2015-06-24 LAB — URINE MICROSCOPIC-ADD ON

## 2015-06-24 MED ORDER — DEXTROSE 5 % IV SOLN
1.0000 g | Freq: Once | INTRAVENOUS | Status: AC
  Administered 2015-06-24: 1 g via INTRAVENOUS
  Filled 2015-06-24: qty 10

## 2015-06-24 MED ORDER — DIPHENHYDRAMINE HCL 25 MG PO TABS: 25.0000 mg | ORAL_TABLET | Freq: Four times a day (QID) | ORAL | Status: DC

## 2015-06-24 MED ORDER — CEPHALEXIN 500 MG PO CAPS: 500.0000 mg | ORAL_CAPSULE | Freq: Four times a day (QID) | ORAL | Status: DC

## 2015-06-24 NOTE — ED Notes (Signed)
Pt sts swelling in bilateral ankles and skin rash with peeling

## 2015-06-24 NOTE — Telephone Encounter (Signed)
Patient was in clinic to pickup his medication and asked to see me because I sent him a letter to his address on file requesting him to contact me. Patient was given his lab result and per Dr. Jarold Song instructed to take 60mg  of Lasix BID. Patient informed me per his Cardiologist, Dr. Terrence Dupont gave him the instruction to take a total of (2) 20 mg lasix, 1 in the morning and evening. Take a total of (2) 3.125 mg of Carvedilol, 1 in the morning and 1 in the evening. Patient states he's stilling having issues with dryness of his skin, itching and peeling. I informed patient that I would send Dr. Jarold Song a message with this new information.

## 2015-06-24 NOTE — Discharge Instructions (Signed)

## 2015-06-24 NOTE — ED Provider Notes (Signed)
CSN: [id number]     Arrival date & time 06/24/15  1255 History   First MD Initiated Contact with Patient 06/24/15 1718     Chief Complaint  Patient presents with  . Leg Swelling  . Rash      HPI Patient presents with itchy skin and some swelling in both ankles.  Patient has indwelling Foley catheter which is due to come out in a week and had been changed for last month and a half.  He states his urine has darker color than normal.  Denies any definite fever or chills.  No nausea vomiting or diarrhea. Past Medical History  Diagnosis Date  . Hypertension   . Homelessness   . Urinary hesitancy   . Hypercholesterolemia   . NSTEMI (non-ST elevated myocardial infarction) ([place]) 01/07/2015  . Heart attack ([place]) 07/2014  . Walking pneumonia 07/2014  . Headache     "probably weekly" (01/07/2015)  . Arthritis     "all over" (01/07/2015)  . Anxiety   . Depression   . Noncompliance   . Polysubstance abuse     etoh, cocaine  . Ischemic dilated cardiomyopathy   . CKD (chronic kidney disease), stage IV ([place])   . History of echocardiogram 12/2014    EF 20-25%   Past Surgical History  Procedure Laterality Date  . Left heart catheterization with coronary angiogram N/A 08/15/2014    Procedure: LEFT HEART CATHETERIZATION WITH CORONARY ANGIOGRAM;  Surgeon: Clent Demark, MD;  Location[facility] CATH LAB;  Service: Cardiovascular;  Laterality: N/A;  . Cardiac catheterization     History reviewed. No pertinent family history. Social History  Substance Use Topics  . Smoking status: Former Smoker -- 1.50 packs/day for 30 years    Types: Cigarettes    Quit date: 02/19/2004  . Smokeless tobacco: Never Used  . Alcohol Use: Yes     Comment: social    Review of Systems  Constitutional: Negative for fever and chills.  All other systems reviewed and are negative.     Allergies  Review of patient's allergies indicates no known allergies.  Home Medications   Prior to Admission medications    Medication Sig Start Date End Date Taking? Authorizing Provider  allopurinol (ZYLOPRIM) 300 MG tablet Take 300 mg by mouth daily. 06/09/15  Yes Historical Provider, MD  aspirin EC 81 MG tablet Take 81 mg by mouth daily.   Yes Historical Provider, MD  atorvastatin (LIPITOR) 80 MG tablet Take 80 mg by mouth daily.   Yes Historical Provider, MD  carvedilol (COREG) 3.125 MG tablet Take 1 tablet (3.125 mg total) by mouth 2 (two) times daily with a meal. Discontinue 6.25mg  03/24/15  Yes [name], MD  clopidogrel (PLAVIX) 75 MG tablet Take 75 mg by mouth daily.   Yes Historical Provider, MD  colchicine 0.6 MG tablet Take 0.6 mg by mouth daily.   Yes Historical Provider, MD  furosemide (LASIX) 20 MG tablet Take 1 tablet (20 mg total) by mouth daily. Patient taking differently: Take 40 mg by mouth daily.  05/29/15  Yes Belkys A Regalado, MD  isosorbide mononitrate (IMDUR) 30 MG 24 hr tablet Take 30 mg by mouth daily.   Yes Historical Provider, MD  isosorbide-hydrALAZINE (BIDIL) 20-37.5 MG tablet Take 1 tablet by mouth 2 (two) times daily.   Yes Historical Provider, MD  nitroGLYCERIN (NITROSTAT) 0.4 MG SL tablet Place 1 tablet (0.4 mg total) under the tongue every 5 (five) minutes x 3 doses as needed for chest pain.  08/16/14  Yes Dixie Dials, MD  ramipril (ALTACE) 1.25 MG capsule Take 1.25 mg by mouth daily.   Yes Historical Provider, MD  cephALEXin (KEFLEX) 500 MG capsule Take 1 capsule (500 mg total) by mouth 4 (four) times daily. 06/24/15   Leonard Schwartz, MD  diphenhydrAMINE (BENADRYL) 25 MG tablet Take 1 tablet (25 mg total) by mouth every 6 (six) hours. 06/24/15   Leonard Schwartz, MD   BP 125/77 mmHg  Pulse 90  Temp(Src) 98 F (36.7 C) (Oral)  Resp 18  SpO2 98% Physical Exam  Constitutional: He is oriented to person, place, and time. He appears well-developed and well-nourished. No distress.  HENT:  Head: Normocephalic and atraumatic.  Eyes: Pupils are equal, round, and reactive to light.   Neck: Normal range of motion.  Cardiovascular: Normal rate and intact distal pulses.   Pulmonary/Chest: No respiratory distress.  Abdominal: Normal appearance. He exhibits no distension. There is no tenderness. There is no rebound. Hernia confirmed negative in the right inguinal area and confirmed negative in the left inguinal area.  Genitourinary: Testes normal and penis normal.  Musculoskeletal: Normal range of motion.  Neurological: He is alert and oriented to person, place, and time. No cranial nerve deficit.  Skin: Skin is warm and dry. No rash noted.  Psychiatric: He has a normal mood and affect. His behavior is normal.  Nursing note and vitals reviewed.   ED Course  Procedures (including critical care time) Medications  cefTRIAXone (ROCEPHIN) 1 g in dextrose 5 % 50 mL IVPB (not administered)    Labs Review Labs Reviewed  COMPREHENSIVE METABOLIC PANEL - Abnormal; Notable for the following:    Creatinine, Ser 1.39 (*)    Calcium 8.5 (*)    Albumin 2.5 (*)    AST 63 (*)    Alkaline Phosphatase 165 (*)    Total Bilirubin 2.1 (*)    GFR calc non Af Amer 53 (*)    All other components within normal limits  CBC - Abnormal; Notable for the following:    WBC 11.6 (*)    RBC 2.80 (*)    Hemoglobin 9.5 (*)    HCT 29.4 (*)    MCV 105.0 (*)    RDW 15.6 (*)    All other components within normal limits  URINALYSIS, ROUTINE W REFLEX MICROSCOPIC (NOT AT [facility]) - Abnormal; Notable for the following:    APPearance CLOUDY (*)    Hgb urine dipstick MODERATE (*)    Protein, ur 30 (*)    Nitrite POSITIVE (*)    Leukocytes, UA LARGE (*)    All other components within normal limits  URINE MICROSCOPIC-ADD ON - Abnormal; Notable for the following:    Squamous Epithelial / LPF 0-5 (*)    Bacteria, UA MANY (*)    All other components within normal limits  URINE CULTURE    Imaging Review I have personally reviewed and evaluated these images and lab results as part of my medical  decision-making.    MDM   Final diagnoses:  UTI (lower urinary tract infection)        Leonard Schwartz, MD 06/24/15 2043

## 2015-06-25 LAB — URINE CULTURE: Special Requests: NORMAL

## 2015-06-30 LAB — AFB CULTURE WITH SMEAR (NOT AT ARMC)
ACID FAST SMEAR: NONE SEEN
Special Requests: NORMAL

## 2015-07-11 ENCOUNTER — Emergency Department (HOSPITAL_COMMUNITY): Payer: Medicaid Other

## 2015-07-11 ENCOUNTER — Emergency Department (HOSPITAL_COMMUNITY)
Admission: EM | Admit: 2015-07-11 | Discharge: 2015-07-11 | Disposition: A | Discharge status: Home / Self Care | Payer: Medicaid Other | Attending: Emergency Medicine | Admitting: Emergency Medicine

## 2015-07-11 DIAGNOSIS — Z87891 Personal history of nicotine dependence: Secondary | ICD-10-CM | POA: Diagnosis not present

## 2015-07-11 DIAGNOSIS — Z79899 Other long term (current) drug therapy: Secondary | ICD-10-CM | POA: Diagnosis not present

## 2015-07-11 DIAGNOSIS — G8929 Other chronic pain: Secondary | ICD-10-CM | POA: Insufficient documentation

## 2015-07-11 DIAGNOSIS — Z792 Long term (current) use of antibiotics: Secondary | ICD-10-CM | POA: Diagnosis not present

## 2015-07-11 DIAGNOSIS — I129 Hypertensive chronic kidney disease with stage 1 through stage 4 chronic kidney disease, or unspecified chronic kidney disease: Secondary | ICD-10-CM | POA: Insufficient documentation

## 2015-07-11 DIAGNOSIS — Z8659 Personal history of other mental and behavioral disorders: Secondary | ICD-10-CM | POA: Diagnosis not present

## 2015-07-11 DIAGNOSIS — Z590 Homelessness: Secondary | ICD-10-CM | POA: Insufficient documentation

## 2015-07-11 DIAGNOSIS — M25521 Pain in right elbow: Secondary | ICD-10-CM

## 2015-07-11 DIAGNOSIS — Z9889 Other specified postprocedural states: Secondary | ICD-10-CM | POA: Diagnosis not present

## 2015-07-11 DIAGNOSIS — Z7982 Long term (current) use of aspirin: Secondary | ICD-10-CM | POA: Diagnosis not present

## 2015-07-11 DIAGNOSIS — I2510 Atherosclerotic heart disease of native coronary artery without angina pectoris: Secondary | ICD-10-CM | POA: Insufficient documentation

## 2015-07-11 DIAGNOSIS — Z8701 Personal history of pneumonia (recurrent): Secondary | ICD-10-CM | POA: Diagnosis not present

## 2015-07-11 DIAGNOSIS — N184 Chronic kidney disease, stage 4 (severe): Secondary | ICD-10-CM | POA: Diagnosis not present

## 2015-07-11 DIAGNOSIS — M1990 Unspecified osteoarthritis, unspecified site: Secondary | ICD-10-CM | POA: Diagnosis not present

## 2015-07-11 DIAGNOSIS — Z9119 Patient's noncompliance with other medical treatment and regimen: Secondary | ICD-10-CM | POA: Insufficient documentation

## 2015-07-11 DIAGNOSIS — M109 Gout, unspecified: Secondary | ICD-10-CM

## 2015-07-11 DIAGNOSIS — Z7902 Long term (current) use of antithrombotics/antiplatelets: Secondary | ICD-10-CM | POA: Diagnosis not present

## 2015-07-11 DIAGNOSIS — E7800 Pure hypercholesterolemia, unspecified: Secondary | ICD-10-CM | POA: Insufficient documentation

## 2015-07-11 DIAGNOSIS — M25421 Effusion, right elbow: Secondary | ICD-10-CM

## 2015-07-11 DIAGNOSIS — M10021 Idiopathic gout, right elbow: Secondary | ICD-10-CM | POA: Diagnosis not present

## 2015-07-11 MED ORDER — PREDNISONE 20 MG PO TABS
60.0000 mg | ORAL_TABLET | Freq: Once | ORAL | Status: AC
  Administered 2015-07-11: 60 mg via ORAL
  Filled 2015-07-11: qty 3

## 2015-07-11 MED ORDER — TRAMADOL HCL 50 MG PO TABS: 50.0000 mg | ORAL_TABLET | Freq: Two times a day (BID) | ORAL | Status: DC | PRN

## 2015-07-11 MED ORDER — PREDNISONE 20 MG PO TABS: ORAL_TABLET | ORAL | Status: DC

## 2015-07-11 MED ORDER — TRAMADOL HCL 50 MG PO TABS
50.0000 mg | ORAL_TABLET | Freq: Once | ORAL | Status: AC
  Administered 2015-07-11: 50 mg via ORAL
  Filled 2015-07-11: qty 1

## 2015-07-11 NOTE — ED Provider Notes (Signed)
CSN: [id number]     Arrival date & time 07/11/15  0108 History   First MD Initiated Contact with Patient 07/11/15 0112     Chief Complaint  Patient presents with  . Arm Pain    Right Elbow     (Consider location/radiation/quality/duration/timing/severity/associated sxs/prior Treatment) HPI Comments: Daeshaun Allegretto is a 59 y.o. male with a PMHx of HTN, homelessness, HLD, NSTEMI, chronic headaches, chronic arthritis, gout, anxiety/depression, polysubstance abuse, ischemic dilated cardiomyopathy, and CKD4, who presents to the ED with complaints of right elbow pain 1 day. Patient says a pain is 10/10 constant aching nonradiating right elbow pain worse with movement and with no treatments tried prior to arrival. States that began yesterday. Associated symptoms include swelling. Positive history of gout. Patient states he ate red meat yesterday prior to onset of symptoms. He is unsure if he is on any medications for prophylaxis of gout.   Pt denies joint erythema/warmth, skin injury, recent trauma/injury to elbow, IVDU, fevers, chills, CP, SOB, abd pain, N/V/D/C, hematuria, dysuria, myalgias, numbness, tingling, weakness, or skin changes.   Patient is a 59 y.o. male presenting with arm pain. The history is provided by the patient and medical records. No language interpreter was used.  Arm Pain This is a new problem. The current episode started yesterday. The problem occurs constantly. The problem has been unchanged. Associated symptoms include arthralgias (R elbow) and joint swelling. Pertinent negatives include no abdominal pain, chest pain, chills, fever, myalgias, nausea, numbness, urinary symptoms, vomiting or weakness. Exacerbated by: movement of elbow. He has tried nothing for the symptoms. The treatment provided no relief.    Past Medical History  Diagnosis Date  . Hypertension   . Homelessness   . Urinary hesitancy   . Hypercholesterolemia   . NSTEMI (non-ST elevated myocardial  infarction) ([facility]) 01/07/2015  . Heart attack ([place]) 07/2014  . Walking pneumonia 07/2014  . Headache     "probably weekly" (01/07/2015)  . Arthritis     "all over" (01/07/2015)  . Anxiety   . Depression   . Noncompliance   . Polysubstance abuse     etoh, cocaine  . Ischemic dilated cardiomyopathy   . CKD (chronic kidney disease), stage IV ([place])   . History of echocardiogram 12/2014    EF 20-25%   Past Surgical History  Procedure Laterality Date  . Left heart catheterization with coronary angiogram N/A 08/15/2014    Procedure: LEFT HEART CATHETERIZATION WITH CORONARY ANGIOGRAM;  Surgeon: Clent Demark, MD;  Location: [facility] CATH LAB;  Service: Cardiovascular;  Laterality: N/A;  . Cardiac catheterization     No family history on file. Social History  Substance Use Topics  . Smoking status: Former Smoker -- 1.50 packs/day for 30 years    Types: Cigarettes    Quit date: 02/19/2004  . Smokeless tobacco: Never Used  . Alcohol Use: Yes     Comment: social    Review of Systems  Constitutional: Negative for fever and chills.  Respiratory: Negative for shortness of breath.   Cardiovascular: Negative for chest pain.  Gastrointestinal: Negative for nausea, vomiting, abdominal pain, diarrhea and constipation.  Genitourinary: Negative for dysuria and hematuria.  Musculoskeletal: Positive for joint swelling and arthralgias (R elbow). Negative for myalgias.  Skin: Negative for color change and wound.  Allergic/Immunologic: Negative for immunocompromised state.  Neurological: Negative for weakness and numbness.  Psychiatric/Behavioral: Negative for confusion.   10 Systems reviewed and are negative for acute change except as noted in the HPI.  Allergies  Review of patient's allergies indicates no known allergies.  Home Medications   Prior to Admission medications   Medication Sig Start Date End Date Taking? Authorizing Provider  allopurinol (ZYLOPRIM) 300 MG tablet Take 300 mg by mouth  daily. 06/09/15   Historical Provider, MD  aspirin EC 81 MG tablet Take 81 mg by mouth daily.    Historical Provider, MD  atorvastatin (LIPITOR) 80 MG tablet Take 80 mg by mouth daily.    Historical Provider, MD  carvedilol (COREG) 3.125 MG tablet Take 1 tablet (3.125 mg total) by mouth 2 (two) times daily with a meal. Discontinue 6.25mg  03/24/15   [name], MD  cephALEXin (KEFLEX) 500 MG capsule Take 1 capsule (500 mg total) by mouth 4 (four) times daily. 06/24/15   Leonard Schwartz, MD  clopidogrel (PLAVIX) 75 MG tablet Take 75 mg by mouth daily.    Historical Provider, MD  colchicine 0.6 MG tablet Take 0.6 mg by mouth daily.    Historical Provider, MD  diphenhydrAMINE (BENADRYL) 25 MG tablet Take 1 tablet (25 mg total) by mouth every 6 (six) hours. 06/24/15   Leonard Schwartz, MD  furosemide (LASIX) 20 MG tablet Take 1 tablet (20 mg total) by mouth daily. Patient taking differently: Take 40 mg by mouth daily.  05/29/15   Belkys A Regalado, MD  isosorbide mononitrate (IMDUR) 30 MG 24 hr tablet Take 30 mg by mouth daily.    Historical Provider, MD  isosorbide-hydrALAZINE (BIDIL) 20-37.5 MG tablet Take 1 tablet by mouth 2 (two) times daily.    Historical Provider, MD  nitroGLYCERIN (NITROSTAT) 0.4 MG SL tablet Place 1 tablet (0.4 mg total) under the tongue every 5 (five) minutes x 3 doses as needed for chest pain. 08/16/14   Dixie Dials, MD  ramipril (ALTACE) 1.25 MG capsule Take 1.25 mg by mouth daily.    Historical Provider, MD   BP 138/96 mmHg  Pulse 99  Temp(Src) 97.9 F (36.6 C) (Oral)  Resp 16  Ht 5' 8.5" (1.74 m)  Wt 81.647 kg  BMI 26.97 kg/m2  SpO2 100% Physical Exam  Constitutional: He is oriented to person, place, and time. Vital signs are normal. He appears well-developed and well-nourished.  Non-toxic appearance. No distress.  Afebrile, nontoxic, NAD  HENT:  Head: Normocephalic and atraumatic.  Mouth/Throat: Mucous membranes are normal.  Eyes: Conjunctivae and EOM are normal.  Right eye exhibits no discharge. Left eye exhibits no discharge.  Neck: Normal range of motion. Neck supple.  Cardiovascular: Normal rate and intact distal pulses.   Pulmonary/Chest: Effort normal. No respiratory distress.  Abdominal: Normal appearance. He exhibits no distension.  Musculoskeletal:       Right elbow: He exhibits decreased range of motion (due to pain) and effusion. He exhibits no deformity and no laceration. Tenderness found.  R elbow with limited ROM due to pain, but able to still have ~65% of passive flexion/extension performed. +Effusion and diffuse tenderness in joint line. No erythema or warmth. Grip strength and sensation grossly intact, distal pulses intact. Soft compartments.   Neurological: He is alert and oriented to person, place, and time. He has normal strength. No sensory deficit.  Skin: Skin is warm, dry and intact. No rash noted.  Psychiatric: He has a normal mood and affect.  Nursing note and vitals reviewed.   ED Course  Procedures (including critical care time) Labs Review Labs Reviewed - No data to display  Imaging Review Dg Elbow Complete Right  07/11/2015  CLINICAL DATA:  59 year old male with right elbow pain and swelling. EXAM: RIGHT ELBOW - COMPLETE 3+ VIEW COMPARISON:  None. FINDINGS: There is no definite fracture or dislocation. No erosive changes or arthropathy identified. There is elevation of the anterior and posterior fat pads indicative of joint effusion. An occult fracture involving the radial head or neck is not excluded. Clinical correlation is recommended. CT may provide better evaluation if clinically indicated. There is a 13 mm olecranon spur with thickening of the adjacent superficial soft tissues. Clinical correlation is recommended to evaluate for olecranon bursitis. Ultrasound may provide better evaluation if clinically indicated. IMPRESSION: No definite acute fracture or dislocation. A 13 mm olecranon spur with small joint effusion and  superficial skin thickening. Findings may represent olecranon bursitis. An occult fracture is not excluded. Clinical correlation is recommended. Electronically Signed   By: Anner Crete M.D.   On: 07/11/2015 02:30   I have personally reviewed and evaluated these images and lab results as part of my medical decision-making.   EKG Interpretation None      MDM   Final diagnoses:  Acute gout of right elbow, unspecified cause  Right elbow pain  Elbow swelling, right    59 y.o. male here with R elbow pain that began yesterday. +Swelling, no erythema or warmth. +Hx of gout, ate red meat yesterday. On exam, NVI with soft compartments, ROM limited due to pain but able to passively flex/extend ~65%. Doubt septic joint, likely gout, pt's uric acid level in August was 8.7 which corroborates hx of gout. Will obtain xray imaging to eval for possible occult injury but doubt need for labs or other emergent intervention. Will likely send home with prednisone since pt has poor kidney functioning. Will give tramadol now. Will reassess shortly.   3:10 AM Xray showing effusion which is likely from gout, no indication for further imaging since no trauma occurred therefore unlikely to be a fracture. Also shows olecranon spur with effusion, could be bursitis, again likely related to gout. Will give prednisone here and d/c home with 4 more days of prednisone. Discussed diet modifications for avoidance of gout issues. Will rx tramadol, discussed tylenol use as well. F/up with PCP in 1wk. Pt may benefit from prophylactic gout med at f/up. Discussed ice/heat use. I explained the diagnosis and have given explicit precautions to return to the ER including for any other new or worsening symptoms. The patient understands and accepts the medical plan as it's been dictated and I have answered their questions. Discharge instructions concerning home care and prescriptions have been given. The patient is STABLE and is discharged  to home in good condition.  BP 138/96 mmHg  Pulse 99  Temp(Src) 97.9 F (36.6 C) (Oral)  Resp 16  Ht 5' 8.5" (1.74 m)  Wt 81.647 kg  BMI 26.97 kg/m2  SpO2 100%  Meds ordered this encounter  Medications  . traMADol (ULTRAM) tablet 50 mg    Sig:   . predniSONE (DELTASONE) tablet 60 mg    Sig:   . predniSONE (DELTASONE) 20 MG tablet    Sig: 3 tabs po daily x 4 days beginning 07/12/15    Dispense:  12 tablet    Refill:  0    Order Specific Question:  Supervising Provider    Answer:  Noemi Chapel [[id number]]  . traMADol (ULTRAM) 50 MG tablet    Sig: Take 1 tablet (50 mg total) by mouth every 12 (twelve) hours as needed for moderate pain or severe pain.  Dispense:  10 tablet    Refill:  0    Order Specific Question:  Supervising Provider    Answer:  Noemi Chapel [[id number]]     Mackinzie Vuncannon Camprubi-Soms, PA-C [phone]  Delora Fuel, MD [phone]

## 2015-07-11 NOTE — ED Notes (Signed)
Bed: [id number] Expected date:  Expected time:  Means of arrival:  Comments: EMS 58 year old right arm pain

## 2015-07-11 NOTE — Discharge Instructions (Signed)
Use tylenol or ultram as directed as needed for pain. Take prednisone to help with your pain likely related to your gout. Avoid red meats, fish, and other foods listed below to avoid having issues with your gout flares. Use heat or ice to the affected area to help with your swelling/pain. Follow up with your regular doctor in 1 week for recheck and ongoing management. Return to the ER for changes or worsening symptoms.   Gout Gout is when your joints become red, sore, and swell (inflamed). This is caused by the buildup of uric acid crystals in the joints. Uric acid is a chemical that is normally in the blood. If the level of uric acid gets too high in the blood, these crystals form in your joints and tissues. Over time, these crystals can form into masses near the joints and tissues. These masses can destroy bone and cause the bone to look misshapen (deformed). HOME CARE   Do not take aspirin for pain.  Only take medicine as told by your doctor.  Rest the joint as much as you can. When in bed, keep sheets and blankets off painful areas.  Keep the sore joints raised (elevated).  Put warm or cold packs on painful joints. Use of warm or cold packs depends on which works best for you.  Use crutches if the painful joint is in your leg.  Drink enough fluids to keep your pee (urine) clear or pale yellow. Limit alcohol, sugary drinks, and drinks with fructose in them.  Follow your diet instructions. Pay careful attention to how much protein you eat. Include fruits, vegetables, whole grains, and fat-free or low-fat milk products in your daily diet. Talk to your doctor or dietitian about the use of coffee, vitamin C, and cherries. These may help lower uric acid levels.  Keep a healthy body weight. GET HELP RIGHT AWAY IF:   You have watery poop (diarrhea), throw up (vomit), or have any side effects from medicines.  You do not feel better in 24 hours, or you are getting worse.  Your joint becomes  suddenly more tender, and you have chills or a fever. MAKE SURE YOU:   Understand these instructions.  Will watch your condition.  Will get help right away if you are not doing well or get worse.   This information is not intended to replace advice given to you by your health care provider. Make sure you discuss any questions you have with your health care provider.   Document Released: 04/12/2008 Document Revised: 07/25/2014 Document Reviewed: 02/15/2012 Elsevier Interactive Patient Education 2016 Elsevier Inc.  Cryotherapy Cryotherapy is when you put ice on your injury. Ice helps lessen pain and puffiness (swelling) after an injury. Ice works the best when you start using it in the first 24 to 48 hours after an injury. HOME CARE  Put a dry or damp towel between the ice pack and your skin.  You may press gently on the ice pack.  Leave the ice on for no more than 10 to 20 minutes at a time.  Check your skin after 5 minutes to make sure your skin is okay.  Rest at least 20 minutes between ice pack uses.  Stop using ice when your skin loses feeling (numbness).  Do not use ice on someone who cannot tell you when it hurts. This includes small children and people with memory problems (dementia). GET HELP RIGHT AWAY IF:  You have white spots on your skin.  Your skin  turns blue or pale.  Your skin feels waxy or hard.  Your puffiness gets worse. MAKE SURE YOU:   Understand these instructions.  Will watch your condition.  Will get help right away if you are not doing well or get worse.   This information is not intended to replace advice given to you by your health care provider. Make sure you discuss any questions you have with your health care provider.   Document Released: 12/21/2007 Document Revised: 09/26/2011 Document Reviewed: 02/24/2011 Elsevier Interactive Patient Education 2016 [place] therapy can help ease sore, stiff, injured, and  tight muscles and joints. Heat relaxes your muscles, which may help ease your pain. Heat therapy should only be used on old, pre-existing, or long-lasting (chronic) injuries. Do not use heat therapy unless told by your doctor. HOW TO USE HEAT THERAPY There are several different kinds of heat therapy, including:  Moist heat pack.  Warm water bath.  Hot water bottle.  Electric heating pad.  Heated gel pack.  Heated wrap.  Electric heating pad. GENERAL HEAT THERAPY RECOMMENDATIONS   Do not sleep while using heat therapy. Only use heat therapy while you are awake.  Your skin may turn pink while using heat therapy. Do not use heat therapy if your skin turns red.  Do not use heat therapy if you have new pain.  High heat or long exposure to heat can cause burns. Be careful when using heat therapy to avoid burning your skin.  Do not use heat therapy on areas of your skin that are already irritated, such as with a rash or sunburn. GET HELP IF:   You have blisters, redness, swelling (puffiness), or numbness.  You have new pain.  Your pain is worse. MAKE SURE YOU:  Understand these instructions.  Will watch your condition.  Will get help right away if you are not doing well or get worse.   This information is not intended to replace advice given to you by your health care provider. Make sure you discuss any questions you have with your health care provider.   Document Released: 09/26/2011 Document Revised: 07/25/2014 Document Reviewed: 08/27/2013 Elsevier Interactive Patient Education 2016 [place] are compounds that affect the level of uric acid in your body. A low-purine diet is a diet that is low in purines. Eating a low-purine diet can prevent the level of uric acid in your body from getting too high and causing gout or kidney stones or both. WHAT DO I NEED TO KNOW ABOUT THIS DIET?  Choose low-purine foods. Examples of low-purine foods are  listed in the next section.  Drink plenty of fluids, especially water. Fluids can help remove uric acid from your body. Try to drink 8-16 cups (1.9-3.8 L) a day.  Limit foods high in fat, especially saturated fat, as fat makes it harder for the body to get rid of uric acid. Foods high in saturated fat include pizza, cheese, ice cream, whole milk, fried foods, and gravies. Choose foods that are lower in fat and lean sources of protein. Use olive oil when cooking as it contains healthy fats that are not high in saturated fat.  Limit alcohol. Alcohol interferes with the elimination of uric acid from your body. If you are having a gout attack, avoid all alcohol.  Keep in mind that different people's bodies react differently to different foods. You will probably learn over time which foods do or do not affect you.  If you discover that a food tends to cause your gout to flare up, avoid eating that food. You can more freely enjoy foods that do not cause problems. If you have any questions about a food item, talk to your dietitian or health care provider. WHICH FOODS ARE LOW, MODERATE, AND HIGH IN PURINES? The following is a list of foods that are low, moderate, and high in purines. You can eat any amount of the foods that are low in purines. You may be able to have small amounts of foods that are moderate in purines. Ask your health care provider how much of a food moderate in purines you can have. Avoid foods high in purines. Grains  Foods low in purines: Enriched white bread, pasta, rice, cake, cornbread, popcorn.  Foods moderate in purines: Whole-grain breads and cereals, wheat germ, bran, oatmeal. Uncooked oatmeal. Dry wheat bran or wheat germ.  Foods high in purines: Pancakes, [place] toast, biscuits, muffins. Vegetables  Foods low in purines: All vegetables, except those that are moderate in purines.  Foods moderate in purines: Asparagus, cauliflower, spinach, mushrooms, green  peas. Fruits  All fruits are low in purines. Meats and other Protein Foods  Foods low in purines: Eggs, nuts, peanut butter.  Foods moderate in purines: 80-90% lean beef, lamb, veal, pork, poultry, fish, eggs, peanut butter, nuts. Crab, lobster, oysters, and shrimp. Cooked dried beans, peas, and lentils.  Foods high in purines: Anchovies, sardines, herring, mussels, tuna, codfish, scallops, trout, and haddock. Berniece Salines. Organ meats (such as liver or kidney). [place]. Game meat. Goose. Sweetbreads. Dairy  All dairy foods are low in purines. Low-fat and fat-free dairy products are best because they are low in saturated fat. Beverages  Drinks low in purines: Water, carbonated beverages, tea, coffee, cocoa.  Drinks moderate in purines: Soft drinks and other drinks sweetened with high-fructose corn syrup. Juices. To find whether a food or drink is sweetened with high-fructose corn syrup, look at the ingredients list.  Drinks high in purines: Alcoholic beverages (such as beer). Condiments  Foods low in purines: Salt, herbs, olives, pickles, relishes, vinegar.  Foods moderate in purines: Butter, margarine, oils, mayonnaise. Fats and Oils  Foods low in purines: All types, except gravies and sauces made with meat.  Foods high in purines: Gravies and sauces made with meat. Other Foods  Foods low in purines: Sugars, sweets, gelatin. Cake. Soups made without meat.  Foods moderate in purines: Meat-based or fish-based soups, broths, or bouillons. Foods and drinks sweetened with high-fructose corn syrup.  Foods high in purines: High-fat desserts (such as ice cream, cookies, cakes, pies, doughnuts, and chocolate). Contact your dietitian for more information on foods that are not listed here.   This information is not intended to replace advice given to you by your health care provider. Make sure you discuss any questions you have with your health care provider.   Document Released: 10/29/2010  Document Revised: 07/09/2013 Document Reviewed: 06/10/2013 Elsevier Interactive Patient Education [employer].

## 2015-07-11 NOTE — ED Notes (Signed)
Pt c/o right elbow pain that started yesterday. Pt arrived ambulatory with EMS. Pt alert and oriented x 4. VS stable.

## 2015-07-14 ENCOUNTER — Other Ambulatory Visit: Payer: Self-pay | Admitting: Family Medicine

## 2015-07-15 ENCOUNTER — Encounter: Payer: Self-pay | Admitting: Family Medicine

## 2015-07-15 ENCOUNTER — Ambulatory Visit: Payer: Medicaid Other | Attending: Family Medicine | Admitting: Family Medicine

## 2015-07-15 VITALS — BP 148/100 | HR 111 | Temp 97.5°F | Resp 13 | Ht 68.5 in | Wt 174.0 lb

## 2015-07-15 DIAGNOSIS — Z87891 Personal history of nicotine dependence: Secondary | ICD-10-CM | POA: Insufficient documentation

## 2015-07-15 DIAGNOSIS — F419 Anxiety disorder, unspecified: Secondary | ICD-10-CM | POA: Insufficient documentation

## 2015-07-15 DIAGNOSIS — I10 Essential (primary) hypertension: Secondary | ICD-10-CM | POA: Diagnosis present

## 2015-07-15 DIAGNOSIS — Z7982 Long term (current) use of aspirin: Secondary | ICD-10-CM | POA: Insufficient documentation

## 2015-07-15 DIAGNOSIS — I2510 Atherosclerotic heart disease of native coronary artery without angina pectoris: Secondary | ICD-10-CM | POA: Diagnosis not present

## 2015-07-15 DIAGNOSIS — E7800 Pure hypercholesterolemia, unspecified: Secondary | ICD-10-CM | POA: Diagnosis not present

## 2015-07-15 DIAGNOSIS — M25521 Pain in right elbow: Secondary | ICD-10-CM | POA: Insufficient documentation

## 2015-07-15 DIAGNOSIS — I5042 Chronic combined systolic (congestive) and diastolic (congestive) heart failure: Secondary | ICD-10-CM | POA: Insufficient documentation

## 2015-07-15 DIAGNOSIS — Z590 Homelessness: Secondary | ICD-10-CM | POA: Insufficient documentation

## 2015-07-15 DIAGNOSIS — N189 Chronic kidney disease, unspecified: Secondary | ICD-10-CM

## 2015-07-15 DIAGNOSIS — I129 Hypertensive chronic kidney disease with stage 1 through stage 4 chronic kidney disease, or unspecified chronic kidney disease: Secondary | ICD-10-CM | POA: Insufficient documentation

## 2015-07-15 DIAGNOSIS — I252 Old myocardial infarction: Secondary | ICD-10-CM | POA: Insufficient documentation

## 2015-07-15 DIAGNOSIS — F329 Major depressive disorder, single episode, unspecified: Secondary | ICD-10-CM | POA: Diagnosis not present

## 2015-07-15 DIAGNOSIS — I509 Heart failure, unspecified: Secondary | ICD-10-CM | POA: Insufficient documentation

## 2015-07-15 DIAGNOSIS — N179 Acute kidney failure, unspecified: Secondary | ICD-10-CM | POA: Insufficient documentation

## 2015-07-15 DIAGNOSIS — N184 Chronic kidney disease, stage 4 (severe): Secondary | ICD-10-CM | POA: Diagnosis not present

## 2015-07-15 DIAGNOSIS — I255 Ischemic cardiomyopathy: Secondary | ICD-10-CM | POA: Diagnosis not present

## 2015-07-15 DIAGNOSIS — M109 Gout, unspecified: Secondary | ICD-10-CM | POA: Diagnosis not present

## 2015-07-15 DIAGNOSIS — I420 Dilated cardiomyopathy: Secondary | ICD-10-CM | POA: Insufficient documentation

## 2015-07-15 DIAGNOSIS — M10072 Idiopathic gout, left ankle and foot: Secondary | ICD-10-CM | POA: Insufficient documentation

## 2015-07-15 LAB — BASIC METABOLIC PANEL
BUN: 18 mg/dL (ref 7–25)
CO2: 22 mmol/L (ref 20–31)
Calcium: 9.4 mg/dL (ref 8.6–10.3)
Chloride: 102 mmol/L (ref 98–110)
Creat: 1.44 mg/dL — ABNORMAL HIGH (ref 0.70–1.25)
GLUCOSE: 84 mg/dL (ref 65–99)
POTASSIUM: 5.8 mmol/L — AB (ref 3.5–5.3)
Sodium: 138 mmol/L (ref 135–146)

## 2015-07-15 MED ORDER — RANITIDINE HCL 150 MG PO TABS: 150.0000 mg | ORAL_TABLET | Freq: Two times a day (BID) | ORAL | Status: DC

## 2015-07-15 MED ORDER — FUROSEMIDE 20 MG PO TABS: 40.0000 mg | ORAL_TABLET | Freq: Two times a day (BID) | ORAL | Status: DC

## 2015-07-15 MED ORDER — TRAMADOL HCL 50 MG PO TABS: 50.0000 mg | ORAL_TABLET | Freq: Two times a day (BID) | ORAL | Status: DC | PRN

## 2015-07-15 MED ORDER — FUROSEMIDE 40 MG PO TABS: 40.0000 mg | ORAL_TABLET | Freq: Two times a day (BID) | ORAL | Status: DC

## 2015-07-15 NOTE — Progress Notes (Signed)
Patient here for follow up on his gout He states pain is 6/10 in right elbow He is insure what medications he is supposed to be taking and brought a bag of them in He has several of the same medications bun in different doses This RN did not see that he has any gout medications He did not take his medications today

## 2015-07-16 ENCOUNTER — Telehealth: Payer: Self-pay | Admitting: Family Medicine

## 2015-07-16 ENCOUNTER — Telehealth: Payer: Self-pay | Admitting: *Deleted

## 2015-07-16 LAB — BRAIN NATRIURETIC PEPTIDE: BRAIN NATRIURETIC PEPTIDE: 3924.2 pg/mL — AB (ref 0.0–100.0)

## 2015-07-16 NOTE — Telephone Encounter (Signed)
Left HIPAA compliant message for patient to return RN call at [phone] and then called cell number and left HIPAA compliant message with patient's brother to have patient call RN as soon as possible.

## 2015-07-16 NOTE — Telephone Encounter (Signed)
Unable to reach the patient to discuss abnormal labs; left a voice message for the patient to call the clinic to discuss lab results.

## 2015-07-16 NOTE — Telephone Encounter (Signed)
Patient returned call to RN.  Gave patient instructions several times about taking his Lasix 80 mg two times a day.  It was explained to him that he normally takes one pill twice a day but that Dr. Jarold Song wanted him to start taking two pills twice a day as of now.  He states he has not taken his medication yet today.  I advised him to go ahead and take two Lasix pills now and two at bedtime.  I let him know that if he woke up in the morning feeling poorly and like he could not breathe for him to come to the clinic so that we could administer IV Lasix.  Patient states he will and that he has to come tomorrow to pick up his Tramadol prescription.

## 2015-07-16 NOTE — Progress Notes (Signed)
Subjective:    Patient ID: [name], male    DOB: 1956-04-15, 59 y.o.   MRN: [id number]  HPI 59 year old male with a history of hypertension, CHF (EF 25-30%), coronary artery disease, CKD, hyperlipidemia recently seen at [facility] ED on 07/11/15 for right elbow pain and swelling diagnosed as Gout flare for which he was placed on Prednisone. He had a right elbow xray which revealed 64mm olecranon spur with small joint effusion which may represent olecranon bursitis.  Today he complains of mild pain in his right elbow which he rates at a 4/10 and he has been taking his prednisone for his gout flare. He complains of worsening shortness of breath  over the last 2 days but denies any pedal edema or chest.  He brings in a bag containing several medications and is unsure of what he should be taking.  His BP is elevated and he admits not taking his medications today.  Past Medical History  Diagnosis Date  . Hypertension   . Homelessness   . Urinary hesitancy   . Hypercholesterolemia   . NSTEMI (non-ST elevated myocardial infarction) ([place]) 01/07/2015  . Heart attack ([place]) 07/2014  . Walking pneumonia 07/2014  . Headache     "probably weekly" (01/07/2015)  . Arthritis     "all over" (01/07/2015)  . Anxiety   . Depression   . Noncompliance   . Polysubstance abuse     etoh, cocaine  . Ischemic dilated cardiomyopathy   . CKD (chronic kidney disease), stage IV ([place])   . History of echocardiogram 12/2014    EF 20-25%    Past Surgical History  Procedure Laterality Date  . Left heart catheterization with coronary angiogram N/A 08/15/2014    Procedure: LEFT HEART CATHETERIZATION WITH CORONARY ANGIOGRAM;  Surgeon: Clent Demark, MD;  Location: [facility] CATH LAB;  Service: Cardiovascular;  Laterality: N/A;  . Cardiac catheterization      Social History   Social History  . Marital Status: Single    Spouse Name: N/A  . Number of Children: N/A  . Years of Education: N/A   Occupational  History  . Not on file.   Social History Main Topics  . Smoking status: Former Smoker -- 1.50 packs/day for 30 years    Types: Cigarettes    Quit date: 02/19/2004  . Smokeless tobacco: Never Used  . Alcohol Use: Yes     Comment: social  . Drug Use: No     Comment: former  . Sexual Activity: No     Comment: crack   Other Topics Concern  . Not on file   Social History Narrative    No Known Allergies  Current Outpatient Prescriptions on File Prior to Visit  Medication Sig Dispense Refill  . aspirin EC 81 MG tablet Take 81 mg by mouth daily.    Marland Kitchen atorvastatin (LIPITOR) 80 MG tablet Take 80 mg by mouth daily.    . carvedilol (COREG) 3.125 MG tablet Take 1 tablet (3.125 mg total) by mouth 2 (two) times daily with a meal. Discontinue 6.25mg  60 tablet 1  . clopidogrel (PLAVIX) 75 MG tablet Take 75 mg by mouth daily.    . isosorbide mononitrate (IMDUR) 30 MG 24 hr tablet Take 30 mg by mouth daily.    . isosorbide-hydrALAZINE (BIDIL) 20-37.5 MG tablet Take 1 tablet by mouth 2 (two) times daily.    . ramipril (ALTACE) 1.25 MG capsule Take 1.25 mg by mouth daily.    Marland Kitchen sulfamethoxazole-trimethoprim (BACTRIM  DS,SEPTRA DS) 800-160 MG tablet Take 1 tablet by mouth 2 (two) times daily. Reported on 07/11/2015  0  . tamsulosin (FLOMAX) 0.4 MG CAPS capsule Take 0.4 mg by mouth daily. Reported on 07/11/2015  11  . allopurinol (ZYLOPRIM) 300 MG tablet Take 300 mg by mouth daily. Reported on 07/15/2015  6  . colchicine 0.6 MG tablet Take 0.6 mg by mouth daily. Reported on 07/15/2015    . diphenhydrAMINE (BENADRYL) 25 MG tablet Take 1 tablet (25 mg total) by mouth every 6 (six) hours. (Patient not taking: Reported on 07/15/2015) 20 tablet 0  . nitroGLYCERIN (NITROSTAT) 0.4 MG SL tablet Place 1 tablet (0.4 mg total) under the tongue every 5 (five) minutes x 3 doses as needed for chest pain. (Patient not taking: Reported on 07/15/2015) 25 tablet 1   No current facility-administered medications on file  prior to visit.      Review of Systems  Constitutional: Negative for activity change and appetite change.  HENT: Negative for sinus pressure and sore throat.   Eyes: Negative for visual disturbance.  Respiratory: Positive for shortness of breath. Negative for cough and chest tightness.   Cardiovascular: Negative for chest pain and leg swelling.  Gastrointestinal: Negative for abdominal pain, diarrhea, constipation and abdominal distention.  Endocrine: Negative.   Genitourinary: Negative for dysuria.  Musculoskeletal: Negative for myalgias and joint swelling.  Skin: Negative for rash.  Allergic/Immunologic: Negative.   Neurological: Negative for weakness, light-headedness and numbness.  Psychiatric/Behavioral: Negative for suicidal ideas and dysphoric mood.       Objective: Filed Vitals:   07/15/15 1146  BP: 148/100  Pulse: 111  Temp: 97.5 F (36.4 C)  Resp: 13  Height: 5' 8.5" (1.74 m)  Weight: 174 lb (78.926 kg)  SpO2: 100%      Physical Exam  Constitutional: He is oriented to person, place, and time. He appears well-developed and well-nourished.  HENT:  Head: Normocephalic and atraumatic.  Right Ear: External ear normal.  Left Ear: External ear normal.  Eyes: Conjunctivae and EOM are normal. Pupils are equal, round, and reactive to light.  Neck: Normal range of motion. Neck supple. No tracheal deviation present.  Cardiovascular: Normal rate, regular rhythm and normal heart sounds.   No murmur heard. Pulmonary/Chest: Effort normal and breath sounds normal. No respiratory distress. He has no wheezes. He exhibits no tenderness.  Abdominal: Soft. Bowel sounds are normal. He exhibits no mass. There is no tenderness.  Musculoskeletal: Normal range of motion. He exhibits tenderness (mild tenderness in right elbow, no edema). He exhibits no edema.  Neurological: He is alert and oriented to person, place, and time.  Skin: Skin is warm and dry.  Psychiatric: He has a normal  mood and affect.          Assessment & Plan:  Hypertension: Uncontrolled due to missing dose of antihypertensive I performed an extensive review of his medications with the clinical pharmacist in the exam room as the patient is ignorant of what he should be taking. We have identified multiple duplicate medications and duplicate doses which have been fixed  Dilated cardiomyopathy: EF of 25-30%. He does have some dyspnea which could be attributed to the fact that he has not taken his Lasix and has also been on prednisone for acute gout which could be causing some fluid overload. I have instructed him to take 80 mg of Lasix now and then 40 mg in the evening. He will subsequently be on 40 mg twice daily and I  will see him back next week. Review of his medications indicate he is on Bidil as well as Imdur; this will have to be clarified by cardiology.  I have advised him to hold off on the BiDil and take his Imdur.   Gout: Ongoing flare is resolving Advised to hold off on Prednisone as this is exacerbating his CHF Placed on Tramadol  Chronic Kidney Disease: Avoid Nephrotoxic agents Will check renal function

## 2015-07-16 NOTE — Telephone Encounter (Signed)
-----   Message from [name], MD sent at 07/16/2015  1:39 PM EST ----- Patient needs to increase his Lasix to 80mg  (two 40mg ) twice daily until his next visit with me in one week as he is in acute heart failure. Potassium is also elevated but this should be neutralized by the Lasix. I left a message on his phone to call the clinic.

## 2015-07-17 ENCOUNTER — Telehealth: Payer: Self-pay | Admitting: Family Medicine

## 2015-07-17 NOTE — Telephone Encounter (Signed)
Pt. Has some questions concerning his other medications he is taking in addition to the Lasix  Please call pt.

## 2015-07-17 NOTE — Telephone Encounter (Signed)
Left HIPPA compliant message for patient to return RN call [phone]

## 2015-07-19 ENCOUNTER — Encounter (HOSPITAL_COMMUNITY): Payer: Self-pay | Admitting: Family Medicine

## 2015-07-19 ENCOUNTER — Emergency Department (HOSPITAL_COMMUNITY)
Admission: EM | Admit: 2015-07-19 | Discharge: 2015-07-20 | Disposition: A | Discharge status: Home / Self Care | Payer: Medicaid Other | Attending: Emergency Medicine | Admitting: Emergency Medicine

## 2015-07-19 DIAGNOSIS — F419 Anxiety disorder, unspecified: Secondary | ICD-10-CM | POA: Insufficient documentation

## 2015-07-19 DIAGNOSIS — Z590 Homelessness: Secondary | ICD-10-CM | POA: Diagnosis not present

## 2015-07-19 DIAGNOSIS — Z9889 Other specified postprocedural states: Secondary | ICD-10-CM | POA: Insufficient documentation

## 2015-07-19 DIAGNOSIS — Z792 Long term (current) use of antibiotics: Secondary | ICD-10-CM | POA: Diagnosis not present

## 2015-07-19 DIAGNOSIS — M1990 Unspecified osteoarthritis, unspecified site: Secondary | ICD-10-CM | POA: Insufficient documentation

## 2015-07-19 DIAGNOSIS — G8929 Other chronic pain: Secondary | ICD-10-CM | POA: Diagnosis not present

## 2015-07-19 DIAGNOSIS — L299 Pruritus, unspecified: Secondary | ICD-10-CM

## 2015-07-19 DIAGNOSIS — M25571 Pain in right ankle and joints of right foot: Secondary | ICD-10-CM | POA: Diagnosis not present

## 2015-07-19 DIAGNOSIS — I252 Old myocardial infarction: Secondary | ICD-10-CM | POA: Diagnosis not present

## 2015-07-19 DIAGNOSIS — M25572 Pain in left ankle and joints of left foot: Secondary | ICD-10-CM

## 2015-07-19 DIAGNOSIS — R600 Localized edema: Secondary | ICD-10-CM | POA: Insufficient documentation

## 2015-07-19 DIAGNOSIS — Z7902 Long term (current) use of antithrombotics/antiplatelets: Secondary | ICD-10-CM | POA: Insufficient documentation

## 2015-07-19 DIAGNOSIS — Z87891 Personal history of nicotine dependence: Secondary | ICD-10-CM | POA: Insufficient documentation

## 2015-07-19 DIAGNOSIS — Z8701 Personal history of pneumonia (recurrent): Secondary | ICD-10-CM | POA: Diagnosis not present

## 2015-07-19 DIAGNOSIS — I129 Hypertensive chronic kidney disease with stage 1 through stage 4 chronic kidney disease, or unspecified chronic kidney disease: Secondary | ICD-10-CM | POA: Diagnosis not present

## 2015-07-19 DIAGNOSIS — F329 Major depressive disorder, single episode, unspecified: Secondary | ICD-10-CM | POA: Insufficient documentation

## 2015-07-19 DIAGNOSIS — R000 Tachycardia, unspecified: Secondary | ICD-10-CM | POA: Insufficient documentation

## 2015-07-19 DIAGNOSIS — Z7982 Long term (current) use of aspirin: Secondary | ICD-10-CM | POA: Diagnosis not present

## 2015-07-19 DIAGNOSIS — Z79899 Other long term (current) drug therapy: Secondary | ICD-10-CM | POA: Diagnosis not present

## 2015-07-19 DIAGNOSIS — N184 Chronic kidney disease, stage 4 (severe): Secondary | ICD-10-CM | POA: Diagnosis not present

## 2015-07-19 DIAGNOSIS — Z9119 Patient's noncompliance with other medical treatment and regimen: Secondary | ICD-10-CM | POA: Diagnosis not present

## 2015-07-19 LAB — COMPREHENSIVE METABOLIC PANEL
ALBUMIN: 3.5 g/dL (ref 3.5–5.0)
ALT: 24 U/L (ref 17–63)
AST: 35 U/L (ref 15–41)
Alkaline Phosphatase: 130 U/L — ABNORMAL HIGH (ref 38–126)
Anion gap: 9 (ref 5–15)
BILIRUBIN TOTAL: 1.5 mg/dL — AB (ref 0.3–1.2)
BUN: 20 mg/dL (ref 6–20)
CO2: 27 mmol/L (ref 22–32)
Calcium: 9.5 mg/dL (ref 8.9–10.3)
Chloride: 100 mmol/L — ABNORMAL LOW (ref 101–111)
Creatinine, Ser: 1.69 mg/dL — ABNORMAL HIGH (ref 0.61–1.24)
GFR calc Af Amer: 49 mL/min — ABNORMAL LOW (ref >60)
GFR calc non Af Amer: 42 mL/min — ABNORMAL LOW (ref >60)
GLUCOSE: 100 mg/dL — AB (ref 65–99)
POTASSIUM: 4.4 mmol/L (ref 3.5–5.1)
SODIUM: 136 mmol/L (ref 135–145)
TOTAL PROTEIN: 8.2 g/dL — AB (ref 6.5–8.1)

## 2015-07-19 LAB — URINALYSIS, ROUTINE W REFLEX MICROSCOPIC
Bilirubin Urine: NEGATIVE
GLUCOSE, UA: NEGATIVE mg/dL
Hgb urine dipstick: NEGATIVE
Ketones, ur: NEGATIVE mg/dL
NITRITE: NEGATIVE
PROTEIN: NEGATIVE mg/dL
Specific Gravity, Urine: 1.011 (ref 1.005–1.030)
pH: 7 (ref 5.0–8.0)

## 2015-07-19 LAB — URINE MICROSCOPIC-ADD ON
BACTERIA UA: NONE SEEN
RBC / HPF: NONE SEEN RBC/hpf (ref 0–5)
SQUAMOUS EPITHELIAL / LPF: NONE SEEN
WBC UA: NONE SEEN WBC/hpf (ref 0–5)

## 2015-07-19 MED ORDER — DIPHENHYDRAMINE HCL 25 MG PO CAPS
25.0000 mg | ORAL_CAPSULE | Freq: Once | ORAL | Status: AC
  Administered 2015-07-19: 25 mg via ORAL
  Filled 2015-07-19: qty 1

## 2015-07-19 NOTE — ED Provider Notes (Signed)
The patient is 60 years old, he has a known history of multiple medical problems He presents with 2 complaints, one is itching all over, the second is pain in both of his ankles. He does have a history of gout, he also has a history of congestive heart failure and most recently had labs drawn showing that he had in fact elevated BNP, he is known to have a reduced ejection fraction close to 25%.  On exam the patient has scant bilateral pitting edema, he has decreased range of motion of his bilateral ankle secondary to pain but no redness or warmth over these joints. Supple knees, supple hips, no edema above the mid shin. Abdomen is soft, heart and lungs are clear, no rales, speaks in full sentences, no obvious jaundice of the eyes, normal pupils, mucous membranes appear normal, dentition is poor but no signs of dental abscesses, supple neck without lymphadenopathy. The patient's vital signs are rather unremarkable except for a pulse of 105. He has been borderline tachycardic most times that he has come to the hospital as far back as the beginning of this month. He has been here several times.  Repeat labs, evaluate for hyperbilirubinemia and renal dysfunction both of which could cause pruritus. He does have a diffuse dry skin almost an eczematous type rash. He may benefit from topical or oral systemic steroids.  Medical screening examination/treatment/procedure(s) were conducted as a shared visit with non-physician practitioner(s) and myself.  I personally evaluated the patient during the encounter.  Clinical Impression:   Final diagnoses:  Itching  Bilateral ankle pain         Noemi Chapel, MD 07/21/15 1501

## 2015-07-19 NOTE — ED Notes (Signed)
Per EMS pt is c/o itching all over  Pt was seen last week for same  Pt states he has hives on his stomach and abdomen EMS states they could not visualize any hives  Pt is also c/o bilateral ankle pain  Pt has hx of gout

## 2015-07-19 NOTE — ED Notes (Signed)
Vitals entered at 2036 are entered on wrong pt

## 2015-07-19 NOTE — ED Provider Notes (Signed)
CSN: [id number]     Arrival date & time 07/19/15  1957 History   First MD Initiated Contact with Patient 07/19/15 2128     Chief Complaint  Patient presents with  . Pruritis  . Ankle Pain   HPI [name] is a 60 y.o. M PMH significant for HTN, homelessness, HLD, NSTEMI, chronic headaches, chronic arthritis, gout, anxiety/depression, polysubstance abuse, ischemic dilated cardiomyopathy, and CKD4 presenting with a one month history of bilateral ankle pain/swelling and pruritis. He was seen on 12/7 for exact same symptoms. He was diagnosed with a UTI. No fevers, chills, CP, abdominal pain, changes in bowel/bladder habits.   Past Medical History  Diagnosis Date  . Hypertension   . Homelessness   . Urinary hesitancy   . Hypercholesterolemia   . NSTEMI (non-ST elevated myocardial infarction) ([place]) 01/07/2015  . Heart attack ([place]) 07/2014  . Walking pneumonia 07/2014  . Headache     "probably weekly" (01/07/2015)  . Arthritis     "all over" (01/07/2015)  . Anxiety   . Depression   . Noncompliance   . Polysubstance abuse     etoh, cocaine  . Ischemic dilated cardiomyopathy   . CKD (chronic kidney disease), stage IV ([place])   . History of echocardiogram 12/2014    EF 20-25%   Past Surgical History  Procedure Laterality Date  . Left heart catheterization with coronary angiogram N/A 08/15/2014    Procedure: LEFT HEART CATHETERIZATION WITH CORONARY ANGIOGRAM;  Surgeon: Clent Demark, MD;  Location: [facility] CATH LAB;  Service: Cardiovascular;  Laterality: N/A;  . Cardiac catheterization     History reviewed. No pertinent family history. Social History  Substance Use Topics  . Smoking status: Former Smoker -- 1.50 packs/day for 30 years    Types: Cigarettes    Quit date: 02/19/2004  . Smokeless tobacco: Never Used  . Alcohol Use: Yes     Comment: social    Review of Systems  Ten systems are reviewed and are negative for acute change except as noted in the HPI  Allergies  Review of  patient's allergies indicates no known allergies.  Home Medications   Prior to Admission medications   Medication Sig Start Date End Date Taking? Authorizing Provider  allopurinol (ZYLOPRIM) 300 MG tablet Take 300 mg by mouth daily. Reported on 07/15/2015 06/09/15   Historical Provider, MD  aspirin EC 81 MG tablet Take 81 mg by mouth daily.    Historical Provider, MD  atorvastatin (LIPITOR) 80 MG tablet Take 80 mg by mouth daily.    Historical Provider, MD  carvedilol (COREG) 3.125 MG tablet Take 1 tablet (3.125 mg total) by mouth 2 (two) times daily with a meal. Discontinue 6.48m 03/24/15   EArnoldo Morale MD  clopidogrel (PLAVIX) 75 MG tablet Take 75 mg by mouth daily.    Historical Provider, MD  colchicine 0.6 MG tablet Take 0.6 mg by mouth daily. Reported on 07/15/2015    Historical Provider, MD  diphenhydrAMINE (BENADRYL) 25 MG tablet Take 1 tablet (25 mg total) by mouth every 6 (six) hours. Patient not taking: Reported on 07/15/2015 06/24/15   RLeonard Schwartz MD  furosemide (LASIX) 40 MG tablet Take 1 tablet (40 mg total) by mouth 2 (two) times daily. 07/15/15   EArnoldo Morale MD  isosorbide mononitrate (IMDUR) 30 MG 24 hr tablet Take 30 mg by mouth daily.    Historical Provider, MD  isosorbide-hydrALAZINE (BIDIL) 20-37.5 MG tablet Take 1 tablet by mouth 2 (two) times daily.  Historical Provider, MD  nitroGLYCERIN (NITROSTAT) 0.4 MG SL tablet Place 1 tablet (0.4 mg total) under the tongue every 5 (five) minutes x 3 doses as needed for chest pain. Patient not taking: Reported on 07/15/2015 08/16/14   Dixie Dials, MD  ramipril (ALTACE) 1.25 MG capsule Take 1.25 mg by mouth daily.    Historical Provider, MD  ranitidine (ZANTAC) 150 MG tablet Take 1 tablet (150 mg total) by mouth 2 (two) times daily. 07/15/15   [name], MD  sulfamethoxazole-trimethoprim (BACTRIM DS,SEPTRA DS) 800-160 MG tablet Take 1 tablet by mouth 2 (two) times daily. Reported on 07/11/2015 07/07/15   Historical Provider,  MD  tamsulosin (FLOMAX) 0.4 MG CAPS capsule Take 0.4 mg by mouth daily. Reported on 07/11/2015 07/07/15   Historical Provider, MD  traMADol (ULTRAM) 50 MG tablet Take 1 tablet (50 mg total) by mouth every 12 (twelve) hours as needed for moderate pain or severe pain. 07/15/15   [name], MD   BP 102/69 mmHg  Pulse 105  Temp(Src) 99.4 F (37.4 C) (Oral)  Resp 20  Ht 5' 8.5" (1.74 m)  Wt 75.751 kg  BMI 25.02 kg/m2  SpO2 98% Physical Exam  Constitutional: He appears well-developed and well-nourished. No distress.  HENT:  Head: Normocephalic and atraumatic.  Mouth/Throat: Oropharynx is clear and moist. No oropharyngeal exudate.  Eyes: Conjunctivae are normal. Pupils are equal, round, and reactive to light. Right eye exhibits no discharge. Left eye exhibits no discharge. No scleral icterus.  Neck: No tracheal deviation present.  Cardiovascular: Regular rhythm, normal heart sounds and intact distal pulses.  Exam reveals no gallop and no friction rub.   No murmur heard. Tachycardia  Pulmonary/Chest: Effort normal and breath sounds normal. No respiratory distress. He has no wheezes. He has no rales. He exhibits no tenderness.  Abdominal: Soft. Bowel sounds are normal. He exhibits no distension and no mass. There is no tenderness. There is no rebound and no guarding.  Musculoskeletal: He exhibits edema and tenderness.  Pitting edema and decreased ROM with ankles BL. Neurovascularly intact BL.  Lymphadenopathy:    He has no cervical adenopathy.  Neurological: He is alert. Coordination normal.  Skin: Skin is warm and dry. Rash noted. He is not diaphoretic. No erythema.  Dry, scaly, flaky skin, especially along extremities. No erythema, drainage, or vesicles.   Psychiatric: He has a normal mood and affect. His behavior is normal.  Nursing note and vitals reviewed.   ED Course  Procedures  Labs Review Labs Reviewed  COMPREHENSIVE METABOLIC PANEL - Abnormal; Notable for the following:     Chloride 100 (*)    Glucose, Bld 100 (*)    Creatinine, Ser 1.69 (*)    Total Protein 8.2 (*)    Alkaline Phosphatase 130 (*)    Total Bilirubin 1.5 (*)    GFR calc non Af Amer 42 (*)    GFR calc Af Amer 49 (*)    All other components within normal limits  URINALYSIS, ROUTINE W REFLEX MICROSCOPIC (NOT AT [facility]) - Abnormal; Notable for the following:    Leukocytes, UA SMALL (*)    All other components within normal limits  URINE CULTURE  URINE MICROSCOPIC-ADD ON    MDM   Final diagnoses:  Itching  Bilateral ankle pain   Patient chronically ill appearing. Tachycardia, but this is likely chronic for him given prior record review. His skin appears dry, which could be caused by one of his multiple comorbidities. In regards to his joint pain,  trauma, infectious, and gout are less likely. Most likely OA vs CHF.  UA demonstrates small leukocytes, otherwise unremarkable. CMP with bili of 1.5, which is improved from baseline for patient. Alk phos of 130, and SCr of 1.69, which is baseline. Discussed case with Dr. Sabra Heck who advised systemic steroids. Went to discharge patient, and he threw his discharge papers on the floor and informed me he will go to [facility] because we did not do anything for him.   [place], PA-C 07/26/15 2016  Noemi Chapel, MD 07/28/15 [phone]

## 2015-07-19 NOTE — ED Notes (Signed)
Patient reports he has hives "all over" with itching. Also, states "I can hardly walk on either one of my ankles".

## 2015-07-19 NOTE — ED Notes (Signed)
Pt aware that a urine sample is needed.  

## 2015-07-20 MED ORDER — PREDNISONE 10 MG (21) PO TBPK: 10.0000 mg | ORAL_TABLET | Freq: Every day | ORAL | Status: DC

## 2015-07-20 NOTE — Discharge Instructions (Signed)
Mr. [name],  Nice meeting you! Please follow-up with your your primary care provider and dermatology. Return to the emergency department if you develop fevers, chills, increased pain. Feel better soon!  S. Wendie Simmer, PA-C

## 2015-07-20 NOTE — ED Notes (Addendum)
Pt upset upon leaving stating 'they didn't do nothing for me.' Pt threw his discharge paperwork with steroid prescription in the hall on the way out. Refused to sign for discharge paperwork. PA Lexine Baton witnessed this. Ambulatory upon leaving.

## 2015-07-21 LAB — URINE CULTURE: Culture: NO GROWTH

## 2015-07-22 ENCOUNTER — Encounter: Payer: Self-pay | Admitting: Family Medicine

## 2015-07-22 ENCOUNTER — Ambulatory Visit: Payer: Medicaid Other | Attending: Family Medicine | Admitting: Family Medicine

## 2015-07-22 VITALS — BP 138/85 | HR 106 | Temp 97.9°F | Resp 13 | Ht 68.5 in | Wt 174.0 lb

## 2015-07-22 DIAGNOSIS — R21 Rash and other nonspecific skin eruption: Secondary | ICD-10-CM | POA: Diagnosis not present

## 2015-07-22 MED ORDER — TRIAMCINOLONE ACETONIDE 0.1 % EX CREA: 1.0000 "application " | TOPICAL_CREAM | Freq: Two times a day (BID) | CUTANEOUS | Status: DC

## 2015-07-22 MED ORDER — FUROSEMIDE 40 MG PO TABS: 80.0000 mg | ORAL_TABLET | Freq: Two times a day (BID) | ORAL | Status: DC

## 2015-07-22 MED FILL — TRIAMCINOLONE 0.1% CREAM: 0.1 | 30 days supply | Qty: 80 | Fill #0

## 2015-07-22 MED FILL — BIDIL TABLET: 20-37.5 | 30 days supply | Qty: 60 | Fill #1 | Status: TO

## 2015-07-22 NOTE — Progress Notes (Signed)
Patient here to follow up on rash This rash he has had before but states it disappeared for 3 months and now it is back He is using Gold Bond lotion and states it is very itchy and when he gets out of bed in the morning he has dead skin in the bed It is all over his body

## 2015-07-22 NOTE — Progress Notes (Signed)
   Subjective:    Patient ID: [name], male    DOB: May 28, 1956, 60 y.o.   MRN: [id number]  HPI 60 year old male with a history of hypertension, CHF (EF 25-30%), coronary artery disease, CKD who comes into the clinic complaining of pruritic rash for the last 5 days with scaling and peeling of his skin for which he was seen at the emergency room after he had presented with a rash and swelling in his ankles and was prescribed prednisone but he never picked this prescription up; ankle pain and swelling has since resolved. He did have similar symptoms when he was hospitalized 3 months ago and [place] and symptoms resolved. He denies allergy to any foods or creams and denies any fever. Using Gold Bond cream with no improvement.  Past Medical History  Diagnosis Date  . Hypertension   . Homelessness   . Urinary hesitancy   . Hypercholesterolemia   . NSTEMI (non-ST elevated myocardial infarction) ([place]) 01/07/2015  . Heart attack ([place]) 07/2014  . Walking pneumonia 07/2014  . Headache     "probably weekly" (01/07/2015)  . Arthritis     "all over" (01/07/2015)  . Anxiety   . Depression   . Noncompliance   . Polysubstance abuse     etoh, cocaine  . Ischemic dilated cardiomyopathy   . CKD (chronic kidney disease), stage IV ([place])   . History of echocardiogram 12/2014    EF 20-25%       Review of Systems  Constitutional: Negative for activity change and appetite change.  HENT: Negative for sinus pressure and sore throat.   Respiratory: Negative for chest tightness, shortness of breath and wheezing.   Cardiovascular: Negative for chest pain and palpitations.  Gastrointestinal: Negative for abdominal pain, constipation and abdominal distention.  Genitourinary: Negative.   Musculoskeletal: Negative.   Skin:       See history of present illness  Psychiatric/Behavioral: Negative for behavioral problems and dysphoric mood.       Objective: Filed Vitals:   07/22/15 1119  BP: 138/85    Pulse: 106  Temp: 97.9 F (36.6 C)  Resp: 13  Height: 5' 8.5" (1.74 m)  Weight: 174 lb (78.926 kg)  SpO2: 99%      Physical Exam  Constitutional: He is oriented to person, place, and time. He appears well-developed and well-nourished.  Cardiovascular: Normal heart sounds and intact distal pulses.  Tachycardia present.   No murmur heard. Pulmonary/Chest: Effort normal and breath sounds normal. He has no wheezes. He has no rales. He exhibits no tenderness.  Abdominal: Soft. Bowel sounds are normal. He exhibits no distension and no mass. There is no tenderness.  Musculoskeletal: Normal range of motion.  Neurological: He is alert and oriented to person, place, and time.  Skin:  Extensive desquamation of skin with xerosis and associated lichenification in Flexeril compartments of upper extremity.          Assessment & Plan:  Skin rash: Unknown etiology. I will hold off on using oral prednisone due to the fact that he went into acute exacerbation of heart failure 1 week ago when he received prednisone from the ED. We'll use topical steroids and refer him to dermatology and allergies. He has an appointment in 2 days to follow-up on his heart failure   This note has been created with Surveyor, quantity. Any transcriptional errors are unintentional.

## 2015-07-24 ENCOUNTER — Encounter: Payer: Self-pay | Admitting: Family Medicine

## 2015-07-24 ENCOUNTER — Ambulatory Visit: Payer: Medicaid Other | Attending: Family Medicine | Admitting: Family Medicine

## 2015-07-24 VITALS — BP 141/91 | HR 107 | Temp 97.8°F | Resp 13 | Ht 68.5 in | Wt 174.0 lb

## 2015-07-24 DIAGNOSIS — I255 Ischemic cardiomyopathy: Secondary | ICD-10-CM | POA: Diagnosis not present

## 2015-07-24 DIAGNOSIS — N183 Chronic kidney disease, stage 3 unspecified: Secondary | ICD-10-CM

## 2015-07-24 DIAGNOSIS — Z0000 Encounter for general adult medical examination without abnormal findings: Secondary | ICD-10-CM

## 2015-07-24 DIAGNOSIS — I252 Old myocardial infarction: Secondary | ICD-10-CM | POA: Insufficient documentation

## 2015-07-24 DIAGNOSIS — I10 Essential (primary) hypertension: Secondary | ICD-10-CM

## 2015-07-24 DIAGNOSIS — R21 Rash and other nonspecific skin eruption: Secondary | ICD-10-CM | POA: Insufficient documentation

## 2015-07-24 DIAGNOSIS — Z79899 Other long term (current) drug therapy: Secondary | ICD-10-CM | POA: Diagnosis not present

## 2015-07-24 DIAGNOSIS — Z87891 Personal history of nicotine dependence: Secondary | ICD-10-CM | POA: Insufficient documentation

## 2015-07-24 DIAGNOSIS — F329 Major depressive disorder, single episode, unspecified: Secondary | ICD-10-CM | POA: Diagnosis not present

## 2015-07-24 DIAGNOSIS — M10472 Other secondary gout, left ankle and foot: Secondary | ICD-10-CM

## 2015-07-24 DIAGNOSIS — M1990 Unspecified osteoarthritis, unspecified site: Secondary | ICD-10-CM | POA: Diagnosis not present

## 2015-07-24 DIAGNOSIS — Z590 Homelessness: Secondary | ICD-10-CM | POA: Insufficient documentation

## 2015-07-24 DIAGNOSIS — F419 Anxiety disorder, unspecified: Secondary | ICD-10-CM | POA: Diagnosis not present

## 2015-07-24 DIAGNOSIS — M109 Gout, unspecified: Secondary | ICD-10-CM | POA: Diagnosis not present

## 2015-07-24 DIAGNOSIS — I2510 Atherosclerotic heart disease of native coronary artery without angina pectoris: Secondary | ICD-10-CM | POA: Insufficient documentation

## 2015-07-24 DIAGNOSIS — I130 Hypertensive heart and chronic kidney disease with heart failure and stage 1 through stage 4 chronic kidney disease, or unspecified chronic kidney disease: Secondary | ICD-10-CM | POA: Diagnosis not present

## 2015-07-24 DIAGNOSIS — I5042 Chronic combined systolic (congestive) and diastolic (congestive) heart failure: Secondary | ICD-10-CM

## 2015-07-24 DIAGNOSIS — Z7982 Long term (current) use of aspirin: Secondary | ICD-10-CM | POA: Insufficient documentation

## 2015-07-24 DIAGNOSIS — E7800 Pure hypercholesterolemia, unspecified: Secondary | ICD-10-CM | POA: Diagnosis not present

## 2015-07-24 DIAGNOSIS — I420 Dilated cardiomyopathy: Secondary | ICD-10-CM | POA: Diagnosis not present

## 2015-07-24 LAB — COMPLETE METABOLIC PANEL WITH GFR
ALBUMIN: 3.6 g/dL (ref 3.6–5.1)
ALK PHOS: 155 U/L — AB (ref 40–115)
ALT: 19 U/L (ref 9–46)
AST: 25 U/L (ref 10–35)
BUN: 22 mg/dL (ref 7–25)
CHLORIDE: 106 mmol/L (ref 98–110)
CO2: 27 mmol/L (ref 20–31)
Calcium: 9.7 mg/dL (ref 8.6–10.3)
Creat: 1.45 mg/dL — ABNORMAL HIGH (ref 0.70–1.25)
GFR, Est African American: 60 mL/min (ref >=60)
GFR, Est Non African American: 52 mL/min — ABNORMAL LOW (ref >=60)
GLUCOSE: 80 mg/dL (ref 65–99)
POTASSIUM: 4.1 mmol/L (ref 3.5–5.3)
SODIUM: 142 mmol/L (ref 135–146)
Total Bilirubin: 1.2 mg/dL (ref 0.2–1.2)
Total Protein: 7.7 g/dL (ref 6.1–8.1)

## 2015-07-24 MED FILL — TRIAMCINOLONE 0.1% CREAM: 0.1 | 3 days supply | Qty: 80 | Fill #1

## 2015-07-24 NOTE — Progress Notes (Signed)
Patient here for follow up He states he is living in a motel room for last 7-8 months and attends outpatient drug treatment program during the day He does report anxiety GAD-7 score 16  Made allergy appointment for patient at [place] on 08/11/15 at 1045-patient told

## 2015-07-24 NOTE — Patient Instructions (Signed)

## 2015-07-24 NOTE — Progress Notes (Signed)
Subjective:    Patient ID: [name], male    DOB: [birth year], 61 y.o.   MRN: [id number]  HPI 60 year old male with a history of hypertension, CHF (EF 25-30%), coronary artery disease, CK D stage III, hyperlipidemia, gout who comes in today for a follow-up visit. We had performed an extensive review of his medications along with the clinical pharmacist at his last office visit and today he states he has a better understanding of administration of his medications. He was referred to dermatology and a allergist due to a recurrence of an extensive scaly pruritic rash widely distributed over his entire body surface which he also had during his last hospitalization. He received a prednisone prescription from the ED which he never filled as a few days prior he had taken prednisone which resulted in worsening dyspnea.  His dyspnea has improved since his last office visit and he is doing well on the increased dose of 80 mg of Lasix twice daily. He has also noticed some improvement in the scaly rash on his body and continues to use the triamcinolone cream.  He has an upcoming appointment with his cardiologist which comes up next week.  Past Medical History  Diagnosis Date  . Hypertension   . Homelessness   . Urinary hesitancy   . Hypercholesterolemia   . NSTEMI (non-ST elevated myocardial infarction) ([place]) 01/07/2015  . Heart attack ([place]) 07/2014  . Walking pneumonia 07/2014  . Headache     "probably weekly" (01/07/2015)  . Arthritis     "all over" (01/07/2015)  . Anxiety   . Depression   . Noncompliance   . Polysubstance abuse     etoh, cocaine  . Ischemic dilated cardiomyopathy   . CKD (chronic kidney disease), stage IV ([place])   . History of echocardiogram 12/2014    EF 20-25%    Past Surgical History  Procedure Laterality Date  . Left heart catheterization with coronary angiogram N/A 08/15/2014    Procedure: LEFT HEART CATHETERIZATION WITH CORONARY ANGIOGRAM;  Surgeon: Clent Demark, MD;  Location: [facility] CATH LAB;  Service: Cardiovascular;  Laterality: N/A;  . Cardiac catheterization      Social History   Social History  . Marital Status: Single    Spouse Name: N/A  . Number of Children: N/A  . Years of Education: N/A   Occupational History  . Not on file.   Social History Main Topics  . Smoking status: Former Smoker -- 1.50 packs/day for 30 years    Types: Cigarettes    Quit date: 02/19/2004  . Smokeless tobacco: Never Used  . Alcohol Use: Yes     Comment: social  . Drug Use: No     Comment: former  . Sexual Activity: No     Comment: crack   Other Topics Concern  . Not on file   Social History Narrative    No Known Allergies  Current Outpatient Prescriptions on File Prior to Visit  Medication Sig Dispense Refill  . allopurinol (ZYLOPRIM) 300 MG tablet Take 300 mg by mouth daily. Reported on 07/15/2015  6  . aspirin EC 81 MG tablet Take 81 mg by mouth daily.    Marland Kitchen atorvastatin (LIPITOR) 80 MG tablet Take 80 mg by mouth daily.    . carvedilol (COREG) 3.125 MG tablet Take 1 tablet (3.125 mg total) by mouth 2 (two) times daily with a meal. Discontinue 6.25mg  60 tablet 1  . clopidogrel (PLAVIX) 75 MG tablet Take 75 mg by mouth  daily.  4  . diphenhydrAMINE (BENADRYL) 25 MG tablet Take 1 tablet (25 mg total) by mouth every 6 (six) hours. 20 tablet 0  . furosemide (LASIX) 40 MG tablet Take 2 tablets (80 mg total) by mouth 2 (two) times daily. 60 tablet 2  . isosorbide mononitrate (IMDUR) 30 MG 24 hr tablet Take 30 mg by mouth daily. Reported on 07/19/2015    . isosorbide-hydrALAZINE (BIDIL) 20-37.5 MG tablet Take 1 tablet by mouth 2 (two) times daily.    . nitroGLYCERIN (NITROSTAT) 0.4 MG SL tablet Place 1 tablet (0.4 mg total) under the tongue every 5 (five) minutes x 3 doses as needed for chest pain. 25 tablet 1  . ramipril (ALTACE) 1.25 MG capsule Take 1.25 mg by mouth daily.    . ranitidine (ZANTAC) 150 MG tablet Take 1 tablet (150 mg total) by  mouth 2 (two) times daily. 60 tablet 2  . tamsulosin (FLOMAX) 0.4 MG CAPS capsule Take 0.4 mg by mouth daily. Reported on 07/11/2015  11  . traMADol (ULTRAM) 50 MG tablet Take 1 tablet (50 mg total) by mouth every 12 (twelve) hours as needed for moderate pain or severe pain. 50 tablet 0  . triamcinolone cream (KENALOG) 0.1 % Apply 1 application topically 2 (two) times daily. 80 g 1   No current facility-administered medications on file prior to visit.      Review of Systems  Constitutional: Negative for activity change and appetite change.  HENT: Negative for sinus pressure and sore throat.   Eyes: Negative for visual disturbance.  Respiratory: Negative for chest tightness, shortness of breath and wheezing.   Cardiovascular: Negative for chest pain and palpitations.  Gastrointestinal: Negative for abdominal pain, constipation and abdominal distention.  Genitourinary: Negative.   Musculoskeletal: Negative.   Skin:       See history of present illness  Neurological: Negative for light-headedness and numbness.  Psychiatric/Behavioral: Negative for behavioral problems and dysphoric mood.       Objective: Filed Vitals:   07/24/15 1143  BP: 141/91  Pulse: 107  Temp: 97.8 F (36.6 C)  Resp: 13  Height: 5' 8.5" (1.74 m)  Weight: 174 lb (78.926 kg)  SpO2: 100%      Physical Exam  Constitutional: He is oriented to person, place, and time. He appears well-developed and well-nourished.  Cardiovascular: Normal heart sounds and intact distal pulses.  Tachycardia present.   No murmur heard. Pulmonary/Chest: Effort normal and breath sounds normal. He has no wheezes. He has no rales. He exhibits no tenderness.  Abdominal: Soft. Bowel sounds are normal. He exhibits no distension and no mass. There is no tenderness.  Musculoskeletal: Normal range of motion.  Neurological: He is alert and oriented to person, place, and time.  Skin:  Extensive desquamation of skin with xerosis and  associated lichenification in Flexeril compartments of upper extremity.          Assessment & Plan:  Hypertension: Controlled We identified multiple duplicate medications and duplicate doses which were fixed at his last office visit  Dilated cardiomyopathy: EF of 25-30%, NYHA 3-4 He did have some dyspnea which was attributed to the fact that he had also been on prednisone for acute gout which could be causing some fluid overload but he is doing better symptomatically. Continue Lasix 80 mg twice daily Review of his medications indicates he is on Bidil as well as Imdur; this will have to be clarified by cardiology.  I have advised him to hold off on the  BiDil and take his Imdur. Keep appointment with cardiology for next week. Low-sodium diet, limit fluid intake to less than 2 L per day, daily weight checks.   Gout: Controlled, no acute flare  Chronic Kidney Disease: Avoid Nephrotoxic agents Will check renal function today  Rash: Unknown etiology Seems to be improving on triamcinolone which I have advised him to use sparingly and to also mix with Vaseline while using them to speak the face. He has an upcoming appointment with dermatology on 09/01/14 He was also referred to [facility]; we will call the practice to obtain an appointment for him as his chart reveals he had tried to get in contact with the patient and failed.   This note has been created with Surveyor, quantity. Any transcriptional errors are unintentional.

## 2015-07-25 LAB — BRAIN NATRIURETIC PEPTIDE: Brain Natriuretic Peptide: 334.9 pg/mL — ABNORMAL HIGH (ref 0.0–100.0)

## 2015-07-28 ENCOUNTER — Telehealth: Payer: Self-pay | Admitting: *Deleted

## 2015-07-28 NOTE — Telephone Encounter (Signed)
-----   Message from [name], MD sent at 07/28/2015  8:32 AM EST ----- Cardiac function has improved significantly. Continue 80mg  of lasix bid until visit with Cardiology.

## 2015-07-28 NOTE — Telephone Encounter (Signed)
Left HIPAA compliant message for patient to return RN call at [phone]

## 2015-07-29 ENCOUNTER — Ambulatory Visit: Payer: Medicaid Other | Admitting: Family Medicine

## 2015-08-02 ENCOUNTER — Emergency Department (HOSPITAL_COMMUNITY)
Admission: EM | Admit: 2015-08-02 | Discharge: 2015-08-02 | Disposition: A | Discharge status: Home / Self Care | Payer: Medicaid Other | Attending: Emergency Medicine | Admitting: Emergency Medicine

## 2015-08-02 DIAGNOSIS — F329 Major depressive disorder, single episode, unspecified: Secondary | ICD-10-CM | POA: Insufficient documentation

## 2015-08-02 DIAGNOSIS — I252 Old myocardial infarction: Secondary | ICD-10-CM | POA: Insufficient documentation

## 2015-08-02 DIAGNOSIS — Z9889 Other specified postprocedural states: Secondary | ICD-10-CM | POA: Insufficient documentation

## 2015-08-02 DIAGNOSIS — Z7952 Long term (current) use of systemic steroids: Secondary | ICD-10-CM | POA: Diagnosis not present

## 2015-08-02 DIAGNOSIS — M10021 Idiopathic gout, right elbow: Secondary | ICD-10-CM | POA: Insufficient documentation

## 2015-08-02 DIAGNOSIS — Z8701 Personal history of pneumonia (recurrent): Secondary | ICD-10-CM | POA: Diagnosis not present

## 2015-08-02 DIAGNOSIS — M1990 Unspecified osteoarthritis, unspecified site: Secondary | ICD-10-CM | POA: Diagnosis not present

## 2015-08-02 DIAGNOSIS — Z7902 Long term (current) use of antithrombotics/antiplatelets: Secondary | ICD-10-CM | POA: Insufficient documentation

## 2015-08-02 DIAGNOSIS — Z590 Homelessness: Secondary | ICD-10-CM | POA: Diagnosis not present

## 2015-08-02 DIAGNOSIS — Z79899 Other long term (current) drug therapy: Secondary | ICD-10-CM | POA: Diagnosis not present

## 2015-08-02 DIAGNOSIS — I129 Hypertensive chronic kidney disease with stage 1 through stage 4 chronic kidney disease, or unspecified chronic kidney disease: Secondary | ICD-10-CM | POA: Diagnosis not present

## 2015-08-02 DIAGNOSIS — E7800 Pure hypercholesterolemia, unspecified: Secondary | ICD-10-CM | POA: Insufficient documentation

## 2015-08-02 DIAGNOSIS — Z87891 Personal history of nicotine dependence: Secondary | ICD-10-CM | POA: Diagnosis not present

## 2015-08-02 DIAGNOSIS — M109 Gout, unspecified: Secondary | ICD-10-CM

## 2015-08-02 DIAGNOSIS — Z7982 Long term (current) use of aspirin: Secondary | ICD-10-CM | POA: Diagnosis not present

## 2015-08-02 DIAGNOSIS — N184 Chronic kidney disease, stage 4 (severe): Secondary | ICD-10-CM | POA: Insufficient documentation

## 2015-08-02 DIAGNOSIS — F419 Anxiety disorder, unspecified: Secondary | ICD-10-CM | POA: Diagnosis not present

## 2015-08-02 MED ORDER — COLCHICINE 0.6 MG PO TABS: 0.6000 mg | ORAL_TABLET | Freq: Two times a day (BID) | ORAL | Status: DC

## 2015-08-02 NOTE — ED Notes (Signed)
Per PT[facility] , pt. From home with complaint of gout on his right elbow x 2days, pt. Has been treated for said problem, no relief as claimed. Alert and oriented x3. Swelling and warm to touch as noted on said site.

## 2015-08-02 NOTE — ED Notes (Signed)
Bed: [id number] Expected date:  Expected time:  Means of arrival:  Comments: EMS Gout

## 2015-08-02 NOTE — Discharge Instructions (Signed)

## 2015-08-02 NOTE — ED Provider Notes (Signed)
CSN: [id number]     Arrival date & time 08/02/15  0037 History  By signing my name below, I, [name], attest that this documentation has been prepared under the direction and in the presence of Leo Grosser, MD . Electronically Signed: Evelene [name], Scribe. 08/02/2015. 12:53 AM.    Chief Complaint  Patient presents with  . Gout    right elbow    The history is provided by the patient. No language interpreter was used.    HPI Comments:  [name] is a 60 y.o. male with a history of gout, who presents to the Emergency Department complaining of moderate constant right elbow pain with associated swelling  x 2 days. He notes his pain is similar to past gout exacerbations. No alleviating factors noted. He states he has had the elbow aspirated in the past.   Past Medical History  Diagnosis Date  . Hypertension   . Homelessness   . Urinary hesitancy   . Hypercholesterolemia   . NSTEMI (non-ST elevated myocardial infarction) ([place]) 01/07/2015  . Heart attack ([place]) 07/2014  . Walking pneumonia 07/2014  . Headache     "probably weekly" (01/07/2015)  . Arthritis     "all over" (01/07/2015)  . Anxiety   . Depression   . Noncompliance   . Polysubstance abuse     etoh, cocaine  . Ischemic dilated cardiomyopathy   . CKD (chronic kidney disease), stage IV ([place])   . History of echocardiogram 12/2014    EF 20-25%   Past Surgical History  Procedure Laterality Date  . Left heart catheterization with coronary angiogram N/A 08/15/2014    Procedure: LEFT HEART CATHETERIZATION WITH CORONARY ANGIOGRAM;  Surgeon: Clent Demark, MD;  Location: [facility] CATH LAB;  Service: Cardiovascular;  Laterality: N/A;  . Cardiac catheterization     No family history on file. Social History  Substance Use Topics  . Smoking status: Former Smoker -- 1.50 packs/day for 30 years    Types: Cigarettes    Quit date: 02/19/2004  . Smokeless tobacco: Never Used  . Alcohol Use: Yes     Comment: social    Review  of Systems  Constitutional: Negative for fever and chills.  Respiratory: Negative for shortness of breath.   Cardiovascular: Negative for chest pain.  Musculoskeletal: Positive for arthralgias (elbow).  All other systems reviewed and are negative.   Allergies  Review of patient's allergies indicates no known allergies.  Home Medications   Prior to Admission medications   Medication Sig Start Date End Date Taking? Authorizing Provider  allopurinol (ZYLOPRIM) 300 MG tablet Take 300 mg by mouth daily. Reported on 07/15/2015 06/09/15   Historical Provider, MD  aspirin EC 81 MG tablet Take 81 mg by mouth daily.    Historical Provider, MD  atorvastatin (LIPITOR) 80 MG tablet Take 80 mg by mouth daily.    Historical Provider, MD  carvedilol (COREG) 3.125 MG tablet Take 1 tablet (3.125 mg total) by mouth 2 (two) times daily with a meal. Discontinue 6.25mg  03/24/15   [name], MD  clopidogrel (PLAVIX) 75 MG tablet Take 75 mg by mouth daily. 07/15/15   Historical Provider, MD  diphenhydrAMINE (BENADRYL) 25 MG tablet Take 1 tablet (25 mg total) by mouth every 6 (six) hours. 06/24/15   Leonard Schwartz, MD  furosemide (LASIX) 40 MG tablet Take 2 tablets (80 mg total) by mouth 2 (two) times daily. 07/22/15   [name], MD  isosorbide mononitrate (IMDUR) 30 MG 24 hr tablet Take  30 mg by mouth daily. Reported on 07/19/2015    Historical Provider, MD  isosorbide-hydrALAZINE (BIDIL) 20-37.5 MG tablet Take 1 tablet by mouth 2 (two) times daily.    Historical Provider, MD  nitroGLYCERIN (NITROSTAT) 0.4 MG SL tablet Place 1 tablet (0.4 mg total) under the tongue every 5 (five) minutes x 3 doses as needed for chest pain. 08/16/14   Dixie Dials, MD  ramipril (ALTACE) 1.25 MG capsule Take 1.25 mg by mouth daily.    Historical Provider, MD  ranitidine (ZANTAC) 150 MG tablet Take 1 tablet (150 mg total) by mouth 2 (two) times daily. 07/15/15   [name], MD  tamsulosin (FLOMAX) 0.4 MG CAPS capsule Take 0.4 mg by  mouth daily. Reported on 07/11/2015 07/07/15   Historical Provider, MD  traMADol (ULTRAM) 50 MG tablet Take 1 tablet (50 mg total) by mouth every 12 (twelve) hours as needed for moderate pain or severe pain. 07/15/15   [name], MD  triamcinolone cream (KENALOG) 0.1 % Apply 1 application topically 2 (two) times daily. 07/22/15   [name], MD   BP 92/67 mmHg  Pulse 94  Temp(Src) 98.2 F (36.8 C) (Oral)  Resp 19  SpO2 100% Physical Exam  Constitutional: He is oriented to person, place, and time. He appears well-developed and well-nourished. No distress.  HENT:  Head: Normocephalic and atraumatic.  Eyes: Conjunctivae are normal.  Neck: Neck supple. No tracheal deviation present.  Cardiovascular: Normal rate and regular rhythm.   Pulmonary/Chest: Effort normal. No respiratory distress.  Abdominal: Soft. He exhibits no distension.  Musculoskeletal:  Minimal swelling of right elbow with small palpable effusion and warmth; no erythema maintained ROM TTP   Neurological: He is alert and oriented to person, place, and time.  Skin: Skin is warm and dry. No erythema.  Psychiatric: He has a normal mood and affect.  Nursing note and vitals reviewed.   ED Course  Procedures   DIAGNOSTIC STUDIES:  Oxygen Saturation is 95% on RA, adequate by my interpretation.    COORDINATION OF CARE:  12:44 AM Discussed treatment plan with pt at bedside and pt agreed to plan.   MDM   Final diagnoses:  Acute gout of right elbow, unspecified cause    60 y.o. male presents with what he describes as his typical gout flare which this time has occurred in right elbow. No s/s of septic joint. AFVSS. Well appearing. Will treat empirically with colchicine and recommend routine follow up if symptoms do not resolve or if worsening/change in symptoms.  I personally performed the services described in this documentation, which was scribed in my presence. The recorded information has been reviewed and is  accurate.     Leo Grosser, MD 08/03/15 [phone]

## 2015-08-04 NOTE — Telephone Encounter (Signed)
Spoke with patient verified name and date of birth.  Gave instructions to patient.

## 2015-08-04 NOTE — Telephone Encounter (Signed)
-----   Message from [name], MD sent at 07/28/2015  8:32 AM EST ----- Cardiac function has improved significantly. Continue 80mg  of lasix bid until visit with Cardiology.

## 2015-08-10 MED FILL — CLOPIDOGREL 75 MG TABLET: 75 | 30 days supply | Qty: 30 | Fill #1

## 2015-08-10 MED FILL — ISOSORBIDE MN ER 30 MG TAB: 30 | 30 days supply | Qty: 30 | Fill #1

## 2015-08-10 MED FILL — ALLOPURINOL 300 MG TABLET: 300 | 30 days supply | Qty: 30 | Fill #3

## 2015-08-10 MED FILL — ATORVASTATIN 80 MG TABLET: 80 | 30 days supply | Qty: 30 | Fill #1 | Status: TO

## 2015-08-10 MED FILL — FUROSEMIDE 40 MG TABLET: 40 | 30 days supply | Qty: 60 | Fill #1

## 2015-08-10 MED FILL — raNITIdine HCL 150 MG TABS: 150 | 30 days supply | Qty: 60 | Fill #1

## 2015-08-10 MED FILL — TAMSULOSIN HCL 0.4 MG CAP: 0.4 | 30 days supply | Qty: 30 | Fill #1

## 2015-08-24 ENCOUNTER — Ambulatory Visit: Payer: Medicaid Other | Admitting: Family Medicine

## 2015-08-25 ENCOUNTER — Encounter: Payer: Self-pay | Admitting: Family Medicine

## 2015-08-25 ENCOUNTER — Ambulatory Visit: Payer: Medicaid Other | Attending: Family Medicine | Admitting: Family Medicine

## 2015-08-25 VITALS — BP 120/77 | HR 118 | Temp 98.1°F | Resp 14 | Ht 68.5 in | Wt 164.0 lb

## 2015-08-25 DIAGNOSIS — M10472 Other secondary gout, left ankle and foot: Secondary | ICD-10-CM

## 2015-08-25 DIAGNOSIS — R21 Rash and other nonspecific skin eruption: Secondary | ICD-10-CM | POA: Insufficient documentation

## 2015-08-25 DIAGNOSIS — Z79899 Other long term (current) drug therapy: Secondary | ICD-10-CM | POA: Insufficient documentation

## 2015-08-25 DIAGNOSIS — E785 Hyperlipidemia, unspecified: Secondary | ICD-10-CM | POA: Diagnosis not present

## 2015-08-25 DIAGNOSIS — M545 Low back pain: Secondary | ICD-10-CM | POA: Insufficient documentation

## 2015-08-25 DIAGNOSIS — I5040 Unspecified combined systolic (congestive) and diastolic (congestive) heart failure: Secondary | ICD-10-CM | POA: Insufficient documentation

## 2015-08-25 DIAGNOSIS — M2550 Pain in unspecified joint: Secondary | ICD-10-CM

## 2015-08-25 DIAGNOSIS — I2510 Atherosclerotic heart disease of native coronary artery without angina pectoris: Secondary | ICD-10-CM | POA: Insufficient documentation

## 2015-08-25 DIAGNOSIS — M109 Gout, unspecified: Secondary | ICD-10-CM | POA: Diagnosis not present

## 2015-08-25 DIAGNOSIS — I10 Essential (primary) hypertension: Secondary | ICD-10-CM | POA: Diagnosis present

## 2015-08-25 DIAGNOSIS — Z7982 Long term (current) use of aspirin: Secondary | ICD-10-CM | POA: Diagnosis not present

## 2015-08-25 DIAGNOSIS — K921 Melena: Secondary | ICD-10-CM | POA: Diagnosis not present

## 2015-08-25 DIAGNOSIS — I5042 Chronic combined systolic (congestive) and diastolic (congestive) heart failure: Secondary | ICD-10-CM

## 2015-08-25 DIAGNOSIS — M25572 Pain in left ankle and joints of left foot: Secondary | ICD-10-CM | POA: Insufficient documentation

## 2015-08-25 LAB — CBC WITH DIFFERENTIAL/PLATELET
Basophils Absolute: 0.1 10*3/uL (ref 0.0–0.1)
Basophils Relative: 2 % — ABNORMAL HIGH (ref 0–1)
EOS PCT: 9 % — AB (ref 0–5)
Eosinophils Absolute: 0.6 10*3/uL (ref 0.0–0.7)
HEMATOCRIT: 36.8 % — AB (ref 39.0–52.0)
Hemoglobin: 12.3 g/dL — ABNORMAL LOW (ref 13.0–17.0)
LYMPHS PCT: 21 % (ref 12–46)
Lymphs Abs: 1.3 10*3/uL (ref 0.7–4.0)
MCH: 32.6 pg (ref 26.0–34.0)
MCHC: 33.4 g/dL (ref 30.0–36.0)
MCV: 97.6 fL (ref 78.0–100.0)
MONO ABS: 0.9 10*3/uL (ref 0.1–1.0)
MPV: 9.3 fL (ref 8.6–12.4)
Monocytes Relative: 15 % — ABNORMAL HIGH (ref 3–12)
Neutro Abs: 3.3 10*3/uL (ref 1.7–7.7)
Neutrophils Relative %: 53 % (ref 43–77)
Platelets: 257 10*3/uL (ref 150–400)
RBC: 3.77 MIL/uL — AB (ref 4.22–5.81)
RDW: 14.7 % (ref 11.5–15.5)
WBC: 6.2 10*3/uL (ref 4.0–10.5)

## 2015-08-25 MED ORDER — TRAMADOL HCL 50 MG PO TABS: 50.0000 mg | ORAL_TABLET | Freq: Two times a day (BID) | ORAL | Status: DC | PRN

## 2015-08-25 MED FILL — traMADol HCL 50 MG TABS: 50 | 25 days supply | Qty: 50 | Fill #0

## 2015-08-25 MED FILL — BIDIL TABLET: 20-37.5 | 30 days supply | Qty: 60 | Fill #2 | Status: TO

## 2015-08-25 NOTE — Progress Notes (Addendum)
Subjective:  Patient ID: [name], male    DOB: 1956/02/06  Age: 60 y.o. MRN: [id number]  CC: Follow-up   HPI Criss Mohammed presents for 60 year old male with a history of hypertension, CHF (EF 25-30% followed by cardiology-Dr Terrence Dupont), coronary artery disease, CK D stage III, hyperlipidemia, gout who comes in today for a follow-up visit.  Since his last office visit he has been seen by [facility] due to development of recurring dry scaly generalized rash with extensive exfoliation of skin and was told he had no allergies (allergy test was performed as per patient) He also had a recent ED visit for gout flare in his right elbow.  He complains of pain in both ankle joints worse on walking but states pain is absent at this time and at rest and he also has pain in his right elbow and lower back. Denies any swelling of his joints. Review of his medication indicates he never picked up colchicine which was given for acute gout flare. He does have a dark discoloration of his tongue which he noticed 3 days ago and continues to complain of loss of taste sensation, inability to sweat ever since the first episode of the generalized scaly rash he developed in the fall of last year during hospitalization. Rash has improved and he scheduled to see dermatology next week.  Also complains of occasional blood in stool and has never had a colonoscopy; denies constipation\ He is yet to take his medications today hence tachycardia.  Outpatient Prescriptions Prior to Visit  Medication Sig Dispense Refill  . allopurinol (ZYLOPRIM) 300 MG tablet Take 300 mg by mouth daily. Reported on 07/15/2015  6  . aspirin EC 81 MG tablet Take 81 mg by mouth daily.    Marland Kitchen atorvastatin (LIPITOR) 80 MG tablet Take 80 mg by mouth daily.    . carvedilol (COREG) 3.125 MG tablet Take 1 tablet (3.125 mg total) by mouth 2 (two) times daily with a meal. Discontinue 6.25mg  60 tablet 1  . clopidogrel (PLAVIX) 75 MG tablet  Take 75 mg by mouth daily.  4  . colchicine 0.6 MG tablet Take 1 tablet (0.6 mg total) by mouth 2 (two) times daily. 10 tablet 0  . diphenhydrAMINE (BENADRYL) 25 MG tablet Take 1 tablet (25 mg total) by mouth every 6 (six) hours. 20 tablet 0  . furosemide (LASIX) 40 MG tablet Take 2 tablets (80 mg total) by mouth 2 (two) times daily. (Patient taking differently: Take 40 mg by mouth 2 (two) times daily. ) 60 tablet 2  . nitroGLYCERIN (NITROSTAT) 0.4 MG SL tablet Place 1 tablet (0.4 mg total) under the tongue every 5 (five) minutes x 3 doses as needed for chest pain. 25 tablet 1  . ramipril (ALTACE) 1.25 MG capsule Take 1.25 mg by mouth daily.    . ranitidine (ZANTAC) 150 MG tablet Take 1 tablet (150 mg total) by mouth 2 (two) times daily. 60 tablet 2  . tamsulosin (FLOMAX) 0.4 MG CAPS capsule Take 0.4 mg by mouth daily. Reported on 07/11/2015  11  . isosorbide-hydrALAZINE (BIDIL) 20-37.5 MG tablet Take 1 tablet by mouth 2 (two) times daily.    Marland Kitchen triamcinolone cream (KENALOG) 0.1 % Apply 1 application topically 2 (two) times daily. (Patient not taking: Reported on 08/25/2015) 80 g 1  . traMADol (ULTRAM) 50 MG tablet Take 1 tablet (50 mg total) by mouth every 12 (twelve) hours as needed for moderate pain or severe pain. (Patient not taking: Reported on  08/25/2015) 50 tablet 0   No facility-administered medications prior to visit.    ROS Review of Systems  Constitutional: Negative for activity change and appetite change.  HENT: Negative for sinus pressure and sore throat.        Loss of taste sensation  Eyes: Negative for visual disturbance.  Respiratory: Negative for cough, chest tightness and shortness of breath.   Cardiovascular: Negative for chest pain and leg swelling.  Gastrointestinal: Positive for blood in stool. Negative for abdominal pain, diarrhea, constipation and abdominal distention.  Endocrine: Negative.   Genitourinary: Negative for dysuria.  Musculoskeletal:       See history of  present illness  Skin:       See history of present illness  Allergic/Immunologic: Negative.   Neurological: Negative for weakness, light-headedness and numbness.  Psychiatric/Behavioral: Negative for suicidal ideas and dysphoric mood.    Objective:  BP 120/77 mmHg  Pulse 118  Temp(Src) 98.1 F (36.7 C)  Resp 14  Ht 5' 8.5" (1.74 m)  Wt 164 lb (74.39 kg)  BMI 24.57 kg/m2  SpO2 96%  BP/Weight 08/25/2015 [id number]  Systolic BP 123456 123456 [id number]  Diastolic BP 77 79 91  Wt. (Lbs) 164 - 174  BMI 24.57 - 26.07      Physical Exam  Constitutional: He is oriented to person, place, and time. He appears well-developed and well-nourished.  HENT:  Patchy dark discoloration of tongue  Cardiovascular: Normal heart sounds and intact distal pulses.  Tachycardia present.   No murmur heard. Pulmonary/Chest: Effort normal and breath sounds normal. He has no wheezes. He has no rales. He exhibits no tenderness.  Abdominal: Soft. Bowel sounds are normal. He exhibits no distension and no mass. There is no tenderness.  Musculoskeletal: Normal range of motion.  Neurological: He is alert and oriented to person, place, and time.  Psychiatric: He has a normal mood and affect.     Assessment & Plan:   1. Other secondary acute gout of left ankle Educated about the use of allopurinol for prophylactic treatment and colchicine only for acute flares - Uric Acid  2. Rash and nonspecific skin eruption This seems to have improved significantly compared to his last visit. Current darkening of tongue with unknown etiology. Workup by allergy unrevealing He is scheduled to see dermatology in 1 week  3. Arthralgia Could possibly osteoarthritis. Continue tramadol meanwhile we'll rule out autoimmune etiology. - CBC with Differential/Platelet - Rheumatoid factor - Sedimentation rate - Cyclic Citrul Peptide Antibody, IGG - ANA  4. Chronic combined systolic (congestive) and diastolic (congestive)  heart failure (HCC) Not in acute failure at this time We have called cardiology clinic to verify his medications as he is still taking both Imdur and BiDil  5. Benign essential HTN Controlled - COMPLETE METABOLIC PANEL WITH GFR  6. Hematochezia Referred for colonoscopy    Meds ordered this encounter  Medications  . traMADol (ULTRAM) 50 MG tablet    Sig: Take 1 tablet (50 mg total) by mouth every 12 (twelve) hours as needed for moderate pain or severe pain.    Dispense:  50 tablet    Refill:  1    Follow-up: Return in about 2 weeks (around 09/08/2015) for Lab.   [name] MD

## 2015-08-25 NOTE — Progress Notes (Signed)
Patient here for follow up Complains of "black spots" on his tongue Complains of gneralized joint aches Complains of body not producing sweat and feels his taste buds do not work because food tastes the same Unsure about his Imdur and his Bidil

## 2015-08-25 NOTE — Patient Instructions (Signed)

## 2015-08-26 LAB — COMPLETE METABOLIC PANEL WITH GFR
ALT: 23 U/L (ref 9–46)
AST: 29 U/L (ref 10–35)
Albumin: 3.8 g/dL (ref 3.6–5.1)
Alkaline Phosphatase: 125 U/L — ABNORMAL HIGH (ref 40–115)
BUN: 16 mg/dL (ref 7–25)
CALCIUM: 9 mg/dL (ref 8.6–10.3)
CHLORIDE: 105 mmol/L (ref 98–110)
CO2: 25 mmol/L (ref 20–31)
Creat: 1.22 mg/dL (ref 0.70–1.25)
GFR, EST AFRICAN AMERICAN: 74 mL/min (ref >=60)
GFR, EST NON AFRICAN AMERICAN: 64 mL/min (ref >=60)
Glucose, Bld: 99 mg/dL (ref 65–99)
POTASSIUM: 4.3 mmol/L (ref 3.5–5.3)
Sodium: 140 mmol/L (ref 135–146)
Total Bilirubin: 1 mg/dL (ref 0.2–1.2)
Total Protein: 7.1 g/dL (ref 6.1–8.1)

## 2015-08-26 LAB — ANA: ANA: POSITIVE — AB

## 2015-08-26 LAB — ANTI-NUCLEAR AB-TITER (ANA TITER): ANA Titer 1: 1:40 {titer} — ABNORMAL HIGH

## 2015-08-26 LAB — RHEUMATOID FACTOR

## 2015-08-26 LAB — CYCLIC CITRUL PEPTIDE ANTIBODY, IGG

## 2015-08-26 LAB — URIC ACID: URIC ACID, SERUM: 5.1 mg/dL (ref 4.0–7.8)

## 2015-08-26 LAB — SEDIMENTATION RATE: SED RATE: 27 mm/h — AB (ref 0–20)

## 2015-08-26 MED FILL — RAMIPRIL 1.25 MG CAPSULE: 1.25 | 30 days supply | Qty: 30 | Fill #0

## 2015-08-27 ENCOUNTER — Telehealth: Payer: Self-pay | Admitting: *Deleted

## 2015-08-27 NOTE — Telephone Encounter (Signed)
-----   Message from [name], MD sent at 08/27/2015 12:36 PM EST ----- Labs reveal a normal uric acid, negative for rheumatoid arthritis, normal renal function, anemia has improved significantly compared to 2 months ago. It does reveal some increased inflammation due to his current symptoms and he has been advised to keep his appointment with dermatology.

## 2015-08-27 NOTE — Telephone Encounter (Signed)
Left HIPAA compliant message for patient to return RN call.

## 2015-08-28 ENCOUNTER — Encounter: Payer: Self-pay | Admitting: *Deleted

## 2015-08-28 NOTE — Telephone Encounter (Signed)
error 

## 2015-08-28 NOTE — Telephone Encounter (Signed)
Spoke to brother of patient who states he does not know if patient has a phone right now.  He would pass on message for patient to call our office.  Letter mailed to patient's address

## 2015-09-02 ENCOUNTER — Ambulatory Visit (INDEPENDENT_AMBULATORY_CARE_PROVIDER_SITE_OTHER): Payer: Medicaid Other | Admitting: Family Medicine

## 2015-09-02 VITALS — Temp 98.6°F

## 2015-09-02 DIAGNOSIS — R21 Rash and other nonspecific skin eruption: Secondary | ICD-10-CM

## 2015-09-02 NOTE — Patient Instructions (Signed)
It was nice seeing you today. Although we are not [id number] certain about the cause of your skin rash but it does look like a vatmin deficiency problem. We will do some blood test, we also did biopsy. Once we get the result we will call you.

## 2015-09-02 NOTE — Progress Notes (Signed)
   Subjective:    Patient ID: [name], male    DOB: 12-03-1955, 60 y.o.   MRN: [id number]  HPI Skin Rash: started about 4 months ago. He reports skin scaling and shading all over including part of his tongue. His skin color changed with hyperpigmentation and dark spots including his palms. He reports itching. He reports anhydrosis. He says his complexion has changed. He says he used to be lighter skin. He is darker now. His skin is scaly and has to use lotion all the time. Reports taking "whitish 500 mg pill" for this skin about 4 months ago which helped with scaling and but not with hyperpigmentation. He also reports some kind of cream that helped but left some spots on his skin.  Reports history of multiple joint pains. The pains are worse in the morning and ease as the day goes. However, he had normal work up for inflammatory processes. Reports bright blood in stool occasionally. He has not recent colonoscopy on file. Denies dizziness.   ROS  No weight loss, fever or lymphadenopathy. Reports blood in stool last week.  Reports occasional dizziness or lightheadedness. Objective:   Physical Exam Filed Vitals:   09/02/15 1416  Temp: 98.6 F (37 C)  TempSrc: Axillary   Gen: appears well, slow when responding to questions Skin: dry, hyperpigmented, scaly skin all over. Some streaks of hyperpigmentation on his tongue as well. 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 Unlikely to be rheumatologic process with negative work up. He has elevated IgE and eosinophils which could suggest hypersensitivity reaction or drug allergy. We did punch biopsy from his upper arm. We will send the sample for path. We have ordered labs for vitamin A, B3 and 12 levels and CMP.   Procedure note:   Discussed with the patient about the risk and benefits of the procedure. Obtained  written consent. The area is cleaned with alcohol swab and anesthestized with 1% lidocaine using 27 gauge, 1.5 inch needle. After anesthetizing the area, the skin was sterilized with iodine, and a 4 mm punch biopsy was obtained. Hemostasis was achieved with pressure dressing. There was no complication after the procedure.  Patient tolerated the procedure well.

## 2015-09-02 NOTE — Assessment & Plan Note (Deleted)
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 Unlikely to be rheumatologic process with negative work up. He has elevated IgE and eosinophils which is suggestive for hypersensitivity reaction or drug allergy. We did punch biopsy from his upper arm. We will send the sample for path. We have ordered vitamin A, B3 and 12 levels. We have also sent CMP.

## 2015-09-02 NOTE — Progress Notes (Deleted)
Subjective:     Patient ID: [name], male   DOB: [birth year], 60 y.o.   MRN: [id number]  HPI   Review of Systems     Objective:   Physical Exam             Assessment:     ***    Plan:     ***

## 2015-09-03 LAB — COMPREHENSIVE METABOLIC PANEL
ALBUMIN: 3.8 g/dL (ref 3.6–5.1)
ALT: 15 U/L (ref 9–46)
AST: 22 U/L (ref 10–35)
Alkaline Phosphatase: 128 U/L — ABNORMAL HIGH (ref 40–115)
BUN: 19 mg/dL (ref 7–25)
CHLORIDE: 102 mmol/L (ref 98–110)
CO2: 26 mmol/L (ref 20–31)
Calcium: 10.2 mg/dL (ref 8.6–10.3)
Creat: 1.11 mg/dL (ref 0.70–1.25)
Glucose, Bld: 84 mg/dL (ref 65–99)
POTASSIUM: 4.1 mmol/L (ref 3.5–5.3)
Sodium: 139 mmol/L (ref 135–146)
TOTAL PROTEIN: 7.7 g/dL (ref 6.1–8.1)
Total Bilirubin: 1.1 mg/dL (ref 0.2–1.2)

## 2015-09-03 LAB — VITAMIN B12: VITAMIN B 12: 680 pg/mL (ref 200–1100)

## 2015-09-06 LAB — VITAMIN A: VITAMIN A (RETINOIC ACID): 34 ug/dL — AB (ref 38–98)

## 2015-09-08 ENCOUNTER — Other Ambulatory Visit: Payer: Self-pay | Admitting: Family Medicine

## 2015-09-08 ENCOUNTER — Ambulatory Visit: Payer: Medicaid Other | Attending: Family Medicine

## 2015-09-08 DIAGNOSIS — N183 Chronic kidney disease, stage 3 unspecified: Secondary | ICD-10-CM

## 2015-09-09 ENCOUNTER — Telehealth: Payer: Self-pay | Admitting: Family Medicine

## 2015-09-09 ENCOUNTER — Telehealth: Payer: Self-pay | Admitting: *Deleted

## 2015-09-09 LAB — BASIC METABOLIC PANEL
BUN: 19 mg/dL (ref 7–25)
CO2: 27 mmol/L (ref 20–31)
CREATININE: 1.28 mg/dL — AB (ref 0.70–1.25)
Calcium: 9.6 mg/dL (ref 8.6–10.3)
Chloride: 104 mmol/L (ref 98–110)
Glucose, Bld: 78 mg/dL (ref 65–99)
POTASSIUM: 4.9 mmol/L (ref 3.5–5.3)
Sodium: 139 mmol/L (ref 135–146)

## 2015-09-09 LAB — VITAMIN B3: Nicotinic Acid: <20 ng/mL

## 2015-09-09 NOTE — Telephone Encounter (Signed)
-----   Message from [name], MD sent at 09/09/2015  8:32 AM EST ----- Please inform the patient that labs are normal. Thank you.

## 2015-09-09 NOTE — Telephone Encounter (Signed)
Spoke to patient and gave results.

## 2015-09-09 NOTE — Telephone Encounter (Signed)
I called patient to discuss his test result. His Vit A is slightly low. Nicotinamide/Vit B3 is on low side. ?? If this is related to his skin condition. Skin biopsy is still pending. I recommended MVI or B-complex. He will get this OTC. I will contact him once I have biopsy report and consider Eucerin plus Triamcinolone combine for symptomatic treatment.

## 2015-09-10 ENCOUNTER — Telehealth: Payer: Self-pay | Admitting: Family Medicine

## 2015-09-10 MED ORDER — BETAMETHASONE VALERATE 0.1 % EX CREA
TOPICAL_CREAM | Freq: Two times a day (BID) | CUTANEOUS | Status: DC
Start: 2015-09-10 — End: 2016-06-11

## 2015-09-10 MED FILL — BETAMETHASONE VA 0.1% CREAM: 0.1 | 15 days supply | Qty: 30 | Fill #0

## 2015-09-10 NOTE — Telephone Encounter (Signed)
Skin biopsy report discussed with patient, showing lichenoid changes. Differentials include lichen amyloidosis, or lupus. He had ANA checked although it is positive, the titre is not so significant at 40. PCP might want to recheck later. ACEi elevated with the autoimmune labs which may suggest amyloidosis. Plan to treat him with topical corticosteroid. He had used triamcinolone before with minimal improvement. I will switch him to betamethasone.

## 2015-09-14 MED FILL — CLOPIDOGREL 75 MG TABLET: 75 | 30 days supply | Qty: 30 | Fill #2

## 2015-09-14 MED FILL — TAMSULOSIN HCL 0.4 MG CAP: 0.4 | 30 days supply | Qty: 30 | Fill #2

## 2015-09-14 MED FILL — CARVEDILOL 6.25 MG TABLET: 6.25 | 30 days supply | Qty: 60 | Fill #0 | Status: TO

## 2015-09-14 MED FILL — ALLOPURINOL 300 MG TABLET: 300 | 30 days supply | Qty: 30 | Fill #4

## 2015-09-14 MED FILL — FUROSEMIDE 40 MG TABLET: 40 | 30 days supply | Qty: 60 | Fill #2

## 2015-10-05 MED FILL — RAMIPRIL 1.25 MG CAPSULE: 1.25 | 30 days supply | Qty: 30 | Fill #1

## 2015-10-05 MED FILL — traMADol HCL 50 MG TABS: 50 | 25 days supply | Qty: 50 | Fill #1

## 2015-10-05 MED FILL — BETAMETHASONE VA 0.1% CREAM: 0.1 | 15 days supply | Qty: 30 | Fill #1

## 2015-10-09 MED FILL — ISOSORBIDE MN ER 30 MG TAB: 30 | 30 days supply | Qty: 30 | Fill #2

## 2015-10-09 MED FILL — FUROSEMIDE 40 MG TABLET: 40 | 15 days supply | Qty: 60 | Fill #0

## 2015-10-09 MED FILL — ALLOPURINOL 300 MG TABLET: 300 | 30 days supply | Qty: 30 | Fill #5

## 2015-10-09 MED FILL — ATORVASTATIN 80 MG TABLET: 80 | 30 days supply | Qty: 30 | Fill #2 | Status: TO

## 2015-10-09 MED FILL — CLOPIDOGREL 75 MG TABLET: 75 | 30 days supply | Qty: 30 | Fill #3

## 2015-10-09 MED FILL — TAMSULOSIN HCL 0.4 MG CAP: 0.4 | 30 days supply | Qty: 30 | Fill #3

## 2015-10-09 MED FILL — raNITIdine HCL 150 MG TABS: 150 | 30 days supply | Qty: 60 | Fill #2

## 2015-10-14 ENCOUNTER — Ambulatory Visit: Payer: Medicaid Other | Attending: Family Medicine | Admitting: Family Medicine

## 2015-10-14 ENCOUNTER — Encounter: Payer: Self-pay | Admitting: Family Medicine

## 2015-10-14 VITALS — BP 96/64 | HR 96 | Temp 97.6°F | Resp 16 | Ht 68.5 in | Wt 172.0 lb

## 2015-10-14 DIAGNOSIS — I252 Old myocardial infarction: Secondary | ICD-10-CM | POA: Insufficient documentation

## 2015-10-14 DIAGNOSIS — F329 Major depressive disorder, single episode, unspecified: Secondary | ICD-10-CM | POA: Diagnosis not present

## 2015-10-14 DIAGNOSIS — F1010 Alcohol abuse, uncomplicated: Secondary | ICD-10-CM | POA: Insufficient documentation

## 2015-10-14 DIAGNOSIS — I420 Dilated cardiomyopathy: Secondary | ICD-10-CM | POA: Insufficient documentation

## 2015-10-14 DIAGNOSIS — Z79899 Other long term (current) drug therapy: Secondary | ICD-10-CM | POA: Diagnosis not present

## 2015-10-14 DIAGNOSIS — R0602 Shortness of breath: Secondary | ICD-10-CM | POA: Diagnosis not present

## 2015-10-14 DIAGNOSIS — E7800 Pure hypercholesterolemia, unspecified: Secondary | ICD-10-CM | POA: Diagnosis not present

## 2015-10-14 DIAGNOSIS — L509 Urticaria, unspecified: Secondary | ICD-10-CM | POA: Insufficient documentation

## 2015-10-14 DIAGNOSIS — M109 Gout, unspecified: Secondary | ICD-10-CM | POA: Insufficient documentation

## 2015-10-14 DIAGNOSIS — R42 Dizziness and giddiness: Secondary | ICD-10-CM | POA: Diagnosis not present

## 2015-10-14 DIAGNOSIS — Z7982 Long term (current) use of aspirin: Secondary | ICD-10-CM | POA: Insufficient documentation

## 2015-10-14 DIAGNOSIS — I255 Ischemic cardiomyopathy: Secondary | ICD-10-CM | POA: Insufficient documentation

## 2015-10-14 DIAGNOSIS — Z590 Homelessness: Secondary | ICD-10-CM | POA: Diagnosis not present

## 2015-10-14 DIAGNOSIS — I129 Hypertensive chronic kidney disease with stage 1 through stage 4 chronic kidney disease, or unspecified chronic kidney disease: Secondary | ICD-10-CM | POA: Insufficient documentation

## 2015-10-14 DIAGNOSIS — F1420 Cocaine dependence, uncomplicated: Secondary | ICD-10-CM | POA: Insufficient documentation

## 2015-10-14 DIAGNOSIS — M1990 Unspecified osteoarthritis, unspecified site: Secondary | ICD-10-CM | POA: Diagnosis not present

## 2015-10-14 DIAGNOSIS — Z9119 Patient's noncompliance with other medical treatment and regimen: Secondary | ICD-10-CM | POA: Insufficient documentation

## 2015-10-14 DIAGNOSIS — I5042 Chronic combined systolic (congestive) and diastolic (congestive) heart failure: Secondary | ICD-10-CM

## 2015-10-14 DIAGNOSIS — Z87891 Personal history of nicotine dependence: Secondary | ICD-10-CM | POA: Insufficient documentation

## 2015-10-14 DIAGNOSIS — F419 Anxiety disorder, unspecified: Secondary | ICD-10-CM | POA: Diagnosis not present

## 2015-10-14 DIAGNOSIS — K921 Melena: Secondary | ICD-10-CM | POA: Insufficient documentation

## 2015-10-14 DIAGNOSIS — N184 Chronic kidney disease, stage 4 (severe): Secondary | ICD-10-CM | POA: Diagnosis not present

## 2015-10-14 DIAGNOSIS — M10472 Other secondary gout, left ankle and foot: Secondary | ICD-10-CM

## 2015-10-14 DIAGNOSIS — R21 Rash and other nonspecific skin eruption: Secondary | ICD-10-CM | POA: Diagnosis not present

## 2015-10-14 LAB — CBC WITH DIFFERENTIAL/PLATELET
BASOS ABS: 0.1 10*3/uL (ref 0.0–0.1)
BASOS PCT: 2 % — AB (ref 0–1)
EOS ABS: 0.3 10*3/uL (ref 0.0–0.7)
Eosinophils Relative: 8 % — ABNORMAL HIGH (ref 0–5)
HCT: 39.4 % (ref 39.0–52.0)
HEMOGLOBIN: 13.1 g/dL (ref 13.0–17.0)
Lymphocytes Relative: 34 % (ref 12–46)
Lymphs Abs: 1.4 10*3/uL (ref 0.7–4.0)
MCH: 33 pg (ref 26.0–34.0)
MCHC: 33.2 g/dL (ref 30.0–36.0)
MCV: 99.2 fL (ref 78.0–100.0)
MPV: 10.4 fL (ref 8.6–12.4)
Monocytes Absolute: 0.5 10*3/uL (ref 0.1–1.0)
Monocytes Relative: 13 % — ABNORMAL HIGH (ref 3–12)
NEUTROS PCT: 43 % (ref 43–77)
Neutro Abs: 1.8 10*3/uL (ref 1.7–7.7)
PLATELETS: 215 10*3/uL (ref 150–400)
RBC: 3.97 MIL/uL — AB (ref 4.22–5.81)
RDW: 15.6 % — ABNORMAL HIGH (ref 11.5–15.5)
WBC: 4.2 10*3/uL (ref 4.0–10.5)

## 2015-10-14 LAB — COMPLETE METABOLIC PANEL WITH GFR
ALBUMIN: 4.2 g/dL (ref 3.6–5.1)
ALK PHOS: 80 U/L (ref 40–115)
ALT: 15 U/L (ref 9–46)
AST: 24 U/L (ref 10–35)
BILIRUBIN TOTAL: 1.2 mg/dL (ref 0.2–1.2)
BUN: 22 mg/dL (ref 7–25)
CO2: 25 mmol/L (ref 20–31)
Calcium: 9.7 mg/dL (ref 8.6–10.3)
Chloride: 105 mmol/L (ref 98–110)
Creat: 1.7 mg/dL — ABNORMAL HIGH (ref 0.70–1.25)
GFR, EST AFRICAN AMERICAN: 49 mL/min — AB (ref >=60)
GFR, EST NON AFRICAN AMERICAN: 43 mL/min — AB (ref >=60)
GLUCOSE: 87 mg/dL (ref 65–99)
Potassium: 4.4 mmol/L (ref 3.5–5.3)
SODIUM: 141 mmol/L (ref 135–146)
TOTAL PROTEIN: 7.3 g/dL (ref 6.1–8.1)

## 2015-10-14 NOTE — Progress Notes (Signed)
CC: Follow-up on CHF  HPI: [name] is a 60 y.o. male with a history of chronic systolic and diastolic CHF (EF 123XX123), gout who was referred to dermatology and was recently seen at the family practice clinic for a chronic skin rash and urticaria which was previously associated with extensive scaling.  He had a skin biopsy and report read " histological findings are suggestive of a regressed/ remote lichenoid or interface dermatitis including but exclusive of lichen planus, erythema dyschromicum perstans drug reaction, and even potentially lupus erythematosus". He is currently on topical steroids and does have some persisting pruritus but no scaling. He was also referred to [facility] for hematochezia which he last had 3 weeks ago but review of his chart indicates GI was unable to reach the patient and so we have provided him with the number to get in touch with the GI practice.  He complains of shortness of breath with mild exertion and also dizziness especially with bending. Denies pedal edema and states he has been compliant with medications which he does not have with him at this time and he is uncertain of his dose of Lasix. He is closely followed by cardiology.  No Known Allergies Past Medical History  Diagnosis Date  . Hypertension   . Homelessness   . Urinary hesitancy   . Hypercholesterolemia   . NSTEMI (non-ST elevated myocardial infarction) ([place]) 01/07/2015  . Heart attack ([place]) 07/2014  . Walking pneumonia 07/2014  . Headache     "probably weekly" (01/07/2015)  . Arthritis     "all over" (01/07/2015)  . Anxiety   . Depression   . Noncompliance   . Polysubstance abuse     etoh, cocaine  . Ischemic dilated cardiomyopathy   . CKD (chronic kidney disease), stage IV ([place])   . History of echocardiogram 12/2014    EF 20-25%   Current Outpatient Prescriptions on File Prior to Visit  Medication Sig Dispense Refill  . allopurinol (ZYLOPRIM) 300 MG tablet Take 300 mg by  mouth daily. Reported on 07/15/2015  6  . aspirin EC 81 MG tablet Take 81 mg by mouth daily.    Marland Kitchen atorvastatin (LIPITOR) 80 MG tablet Take 80 mg by mouth daily.    . betamethasone valerate (VALISONE) 0.1 % cream Apply topically 2 (two) times daily. 30 g 1  . carvedilol (COREG) 3.125 MG tablet Take 1 tablet (3.125 mg total) by mouth 2 (two) times daily with a meal. Discontinue 6.25mg  60 tablet 1  . clopidogrel (PLAVIX) 75 MG tablet Take 75 mg by mouth daily.  4  . colchicine 0.6 MG tablet Take 1 tablet (0.6 mg total) by mouth 2 (two) times daily. 10 tablet 0  . diphenhydrAMINE (BENADRYL) 25 MG tablet Take 1 tablet (25 mg total) by mouth every 6 (six) hours. 20 tablet 0  . furosemide (LASIX) 40 MG tablet Take 2 tablets (80 mg total) by mouth 2 (two) times daily. (Patient taking differently: Take 40 mg by mouth 2 (two) times daily. ) 60 tablet 2  . isosorbide-hydrALAZINE (BIDIL) 20-37.5 MG tablet Take 1 tablet by mouth 2 (two) times daily.    . nitroGLYCERIN (NITROSTAT) 0.4 MG SL tablet Place 1 tablet (0.4 mg total) under the tongue every 5 (five) minutes x 3 doses as needed for chest pain. 25 tablet 1  . ramipril (ALTACE) 1.25 MG capsule Take 1.25 mg by mouth daily.    . ranitidine (ZANTAC) 150 MG tablet Take 1 tablet (150 mg  total) by mouth 2 (two) times daily. 60 tablet 2  . tamsulosin (FLOMAX) 0.4 MG CAPS capsule Take 0.4 mg by mouth daily. Reported on 07/11/2015  11  . traMADol (ULTRAM) 50 MG tablet Take 1 tablet (50 mg total) by mouth every 12 (twelve) hours as needed for moderate pain or severe pain. 50 tablet 1   No current facility-administered medications on file prior to visit.   History reviewed. No pertinent family history. Social History   Social History  . Marital Status: Single    Spouse Name: N/A  . Number of Children: N/A  . Years of Education: N/A   Occupational History  . Not on file.   Social History Main Topics  . Smoking status: Former Smoker -- 1.50 packs/day for  30 years    Types: Cigarettes    Quit date: 02/19/2004  . Smokeless tobacco: Never Used  . Alcohol Use: Yes     Comment: social  . Drug Use: No     Comment: former  . Sexual Activity: No     Comment: crack   Other Topics Concern  . Not on file   Social History Narrative    Review of Systems: Constitutional: Negative for fever, chills, diaphoresis, activity change, appetite change and fatigue. HENT: Negative for ear pain, nosebleeds, congestion, facial swelling, rhinorrhea, neck pain, neck stiffness and ear discharge.  Eyes: Negative for pain, discharge, redness, itching and visual disturbance. Respiratory: Negative for cough, choking, chest tightness, positive for shortness of breath, negative for wheezing and stridor.  Cardiovascular: Negative for chest pain, palpitations and leg swelling. Gastrointestinal: Negative for abdominal distention. Genitourinary: Negative for dysuria, urgency, frequency, hematuria, flank pain, decreased urine volume, difficulty urinating and dyspareunia.  Musculoskeletal: Negative for back pain, joint swelling, arthralgias and gait problem. Neurological: Positive for dizziness, negative for tremors, seizures, syncope, facial asymmetry, speech difficulty, weakness, light-headedness, numbness and headaches.  Skin: Positive for pruritus Hematological: Negative for adenopathy. Does not bruise/bleed easily. Psychiatric/Behavioral: Negative for hallucinations, behavioral problems, confusion, dysphoric mood, decreased concentration and agitation.    Objective:   Filed Vitals:   10/14/15 0914  BP: 96/64  Pulse: 96  Temp: 97.6 F (36.4 C)  Resp: 16    Physical Exam: Constitutional: Patient appears well-developed and well-nourished. No distress. HENT: Dark colored patches on tongue Neck: Normal ROM. Neck supple. No JVD. No tracheal deviation. No thyromegaly. CVS: RRR, S1/S2 +, no murmurs, no gallops, no carotid bruit.  Pulmonary: Effort and breath  sounds normal, no stridor, rhonchi, wheezes, rales.  Abdominal: Soft. BS +,  no distension, tenderness, rebound or guarding.  Musculoskeletal: Normal range of motion. No edema and no tenderness.  Lymphadenopathy: No lymphadenopathy noted, cervical, inguinal or axillary Neuro: Alert. Normal reflexes, muscle tone coordination. No cranial nerve deficit. Skin: Hyperpigmented, no scaling  Psychiatric: Normal mood and affect. Behavior, judgment, thought content normal.  Lab Results  Component Value Date   WBC 6.2 08/25/2015   HGB 12.3* 08/25/2015   HCT 36.8* 08/25/2015   MCV 97.6 08/25/2015   PLT 257 08/25/2015   Lab Results  Component Value Date   CREATININE 1.28* 09/08/2015   BUN 19 09/08/2015   NA 139 09/08/2015   K 4.9 09/08/2015   CL 104 09/08/2015   CO2 27 09/08/2015    Lab Results  Component Value Date   HGBA1C 5.4 08/14/2014   Lipid Panel     Component Value Date/Time   CHOL 95 09/04/2014 0341   TRIG 76 09/04/2014 0341   HDL  24* 09/04/2014 0341   CHOLHDL 4.0 09/04/2014 0341   VLDL 15 09/04/2014 0341   LDLCALC 56 09/04/2014 0341       Assessment and plan:  Gout -No acute flare is Continue allopurinol for prophylaxis, colchicine for acute flares  Chronic systolic and diastolic CHF -EF 123XX123, diffuse hypokinesis from 2-D echo 04/2015 -Current shortness of breath could be secondary to CHF exacerbation (though JVD is normal and pedal edema is absent); will send off BNP and patient is to call the clinic with his current dose of Lasix as he does not have it with him right now. -Follow closely by [facility]-Dr.Harwani  Dizziness -He is hypertensive today and does not have his medications with him. -This is likely medication induced -Advised to hold off on ramipril for the next 2 weeks after which we'll reassess his blood pressure -Change positions slowly  Rash and urticaria - pellagra vs autoimmune condition (normal niacin level, previously mildly elevated  ANA-will repeat today) -Recently seen at family medicine clinic. -Currently on topical steroid  -Advised to call for follow-up at the family medicine clinic      [name], [facility] [phone] 10/14/2015, 9:29 AM

## 2015-10-14 NOTE — Progress Notes (Signed)
Patient's here for f/up HTN.   Patient reports SOB, and feeling dizzy when bending over to tie shoe. Patient states this is an ongoing issue with him.  Patient denies any pain today.

## 2015-10-15 LAB — BRAIN NATRIURETIC PEPTIDE: BRAIN NATRIURETIC PEPTIDE: 260.2 pg/mL — AB (ref <100)

## 2015-10-15 LAB — ANA: ANA: NEGATIVE

## 2015-10-28 ENCOUNTER — Ambulatory Visit: Payer: Medicaid Other | Attending: Family Medicine | Admitting: Family Medicine

## 2015-10-28 ENCOUNTER — Encounter: Payer: Self-pay | Admitting: Family Medicine

## 2015-10-28 VITALS — BP 100/78 | HR 97 | Temp 97.6°F | Resp 20 | Ht 68.0 in | Wt 171.0 lb

## 2015-10-28 DIAGNOSIS — Z79899 Other long term (current) drug therapy: Secondary | ICD-10-CM | POA: Diagnosis not present

## 2015-10-28 DIAGNOSIS — R42 Dizziness and giddiness: Secondary | ICD-10-CM | POA: Insufficient documentation

## 2015-10-28 DIAGNOSIS — N183 Chronic kidney disease, stage 3 unspecified: Secondary | ICD-10-CM

## 2015-10-28 DIAGNOSIS — I252 Old myocardial infarction: Secondary | ICD-10-CM | POA: Diagnosis not present

## 2015-10-28 DIAGNOSIS — I420 Dilated cardiomyopathy: Secondary | ICD-10-CM | POA: Insufficient documentation

## 2015-10-28 DIAGNOSIS — N184 Chronic kidney disease, stage 4 (severe): Secondary | ICD-10-CM | POA: Insufficient documentation

## 2015-10-28 DIAGNOSIS — I255 Ischemic cardiomyopathy: Secondary | ICD-10-CM | POA: Diagnosis not present

## 2015-10-28 DIAGNOSIS — I959 Hypotension, unspecified: Secondary | ICD-10-CM | POA: Insufficient documentation

## 2015-10-28 DIAGNOSIS — I5042 Chronic combined systolic (congestive) and diastolic (congestive) heart failure: Secondary | ICD-10-CM | POA: Diagnosis not present

## 2015-10-28 DIAGNOSIS — I129 Hypertensive chronic kidney disease with stage 1 through stage 4 chronic kidney disease, or unspecified chronic kidney disease: Secondary | ICD-10-CM | POA: Diagnosis not present

## 2015-10-28 DIAGNOSIS — I9589 Other hypotension: Secondary | ICD-10-CM | POA: Diagnosis not present

## 2015-10-28 DIAGNOSIS — Z7982 Long term (current) use of aspirin: Secondary | ICD-10-CM | POA: Diagnosis not present

## 2015-10-28 DIAGNOSIS — F419 Anxiety disorder, unspecified: Secondary | ICD-10-CM | POA: Diagnosis not present

## 2015-10-28 DIAGNOSIS — Z9889 Other specified postprocedural states: Secondary | ICD-10-CM | POA: Insufficient documentation

## 2015-10-28 DIAGNOSIS — M109 Gout, unspecified: Secondary | ICD-10-CM | POA: Diagnosis not present

## 2015-10-28 DIAGNOSIS — M542 Cervicalgia: Secondary | ICD-10-CM

## 2015-10-28 DIAGNOSIS — E7800 Pure hypercholesterolemia, unspecified: Secondary | ICD-10-CM | POA: Insufficient documentation

## 2015-10-28 DIAGNOSIS — H8113 Benign paroxysmal vertigo, bilateral: Secondary | ICD-10-CM

## 2015-10-28 DIAGNOSIS — M1990 Unspecified osteoarthritis, unspecified site: Secondary | ICD-10-CM | POA: Insufficient documentation

## 2015-10-28 DIAGNOSIS — R3911 Hesitancy of micturition: Secondary | ICD-10-CM | POA: Insufficient documentation

## 2015-10-28 DIAGNOSIS — F329 Major depressive disorder, single episode, unspecified: Secondary | ICD-10-CM | POA: Insufficient documentation

## 2015-10-28 DIAGNOSIS — Z87891 Personal history of nicotine dependence: Secondary | ICD-10-CM | POA: Diagnosis not present

## 2015-10-28 DIAGNOSIS — M25511 Pain in right shoulder: Secondary | ICD-10-CM | POA: Insufficient documentation

## 2015-10-28 MED ORDER — TIZANIDINE HCL 4 MG PO TABS: 4.0000 mg | ORAL_TABLET | Freq: Three times a day (TID) | ORAL | Status: DC | PRN

## 2015-10-28 MED ORDER — MECLIZINE HCL 25 MG PO TABS: 25.0000 mg | ORAL_TABLET | Freq: Three times a day (TID) | ORAL | Status: DC | PRN

## 2015-10-28 MED FILL — tiZANidine HCL 4 MG TABS: 4 | 20 days supply | Qty: 60 | Fill #0

## 2015-10-28 MED FILL — TRAVEL SICKNESS 25 MG TAB C: 25 | 30 days supply | Qty: 90 | Fill #0

## 2015-10-28 NOTE — Progress Notes (Signed)
Patient here for congestive heart failure follow-up. Indicates he "always has shortness of breath."  Patient does not weight himself daily Complaining of neck pain of 9/10. Denies injury.

## 2015-10-28 NOTE — Progress Notes (Signed)
Subjective:    Patient ID: [name], male    DOB: Nov 09, 1955, 60 y.o.   MRN: [id number]  HPI 60 year old male with a history of chronic systolic and diastolic CHF (EF 123XX123), gout here for follow-up of dizziness which he had complained of at his last office visit for which I had held ramipril due to hypotension. He still complains of dizziness which is not as frequent which he describes as occurring with change in position especially getting up from a sitting position and bending from a standing position; denies tinnitus.  Complaints of pain in the right side of his neck for the last 1 month which radiates from his posterior ear down his neck to his right shoulder. He admits to doing some moving and lifting of furniture in the last month.  Denies shortness of breath or chest pains.  Past Medical History  Diagnosis Date  . Hypertension   . Homelessness   . Urinary hesitancy   . Hypercholesterolemia   . NSTEMI (non-ST elevated myocardial infarction) ([place]) 01/07/2015  . Heart attack ([place]) 07/2014  . Walking pneumonia 07/2014  . Headache     "probably weekly" (01/07/2015)  . Arthritis     "all over" (01/07/2015)  . Anxiety   . Depression   . Noncompliance   . Polysubstance abuse     etoh, cocaine  . Ischemic dilated cardiomyopathy   . CKD (chronic kidney disease), stage IV ([place])   . History of echocardiogram 12/2014    EF 20-25%    Past Surgical History  Procedure Laterality Date  . Left heart catheterization with coronary angiogram N/A 08/15/2014    Procedure: LEFT HEART CATHETERIZATION WITH CORONARY ANGIOGRAM;  Surgeon: Clent Demark, MD;  Location: [facility] CATH LAB;  Service: Cardiovascular;  Laterality: N/A;  . Cardiac catheterization      Social History   Social History  . Marital Status: Single    Spouse Name: N/A  . Number of Children: N/A  . Years of Education: N/A   Occupational History  . Not on file.   Social History Main Topics  . Smoking status: Former  Smoker -- 1.50 packs/day for 30 years    Types: Cigarettes    Quit date: 02/19/2004  . Smokeless tobacco: Never Used  . Alcohol Use: No     Comment: social  . Drug Use: No     Comment: former  . Sexual Activity: No     Comment: crack   Other Topics Concern  . Not on file   Social History Narrative    No Known Allergies  Current Outpatient Prescriptions on File Prior to Visit  Medication Sig Dispense Refill  . allopurinol (ZYLOPRIM) 300 MG tablet Take 300 mg by mouth daily. Reported on 07/15/2015  6  . aspirin EC 81 MG tablet Take 81 mg by mouth daily.    Marland Kitchen atorvastatin (LIPITOR) 80 MG tablet Take 80 mg by mouth daily.    . betamethasone valerate (VALISONE) 0.1 % cream Apply topically 2 (two) times daily. 30 g 1  . carvedilol (COREG) 3.125 MG tablet Take 1 tablet (3.125 mg total) by mouth 2 (two) times daily with a meal. Discontinue 6.25mg  60 tablet 1  . clopidogrel (PLAVIX) 75 MG tablet Take 75 mg by mouth daily.  4  . colchicine 0.6 MG tablet Take 1 tablet (0.6 mg total) by mouth 2 (two) times daily. 10 tablet 0  . diphenhydrAMINE (BENADRYL) 25 MG tablet Take 1 tablet (25 mg total) by mouth  every 6 (six) hours. 20 tablet 0  . furosemide (LASIX) 40 MG tablet Take 2 tablets (80 mg total) by mouth 2 (two) times daily. (Patient taking differently: Take 40 mg by mouth 2 (two) times daily. ) 60 tablet 2  . nitroGLYCERIN (NITROSTAT) 0.4 MG SL tablet Place 1 tablet (0.4 mg total) under the tongue every 5 (five) minutes x 3 doses as needed for chest pain. 25 tablet 1  . ramipril (ALTACE) 1.25 MG capsule Take 1.25 mg by mouth daily.    . ranitidine (ZANTAC) 150 MG tablet Take 1 tablet (150 mg total) by mouth 2 (two) times daily. 60 tablet 2  . tamsulosin (FLOMAX) 0.4 MG CAPS capsule Take 0.4 mg by mouth daily. Reported on 07/11/2015  11  . traMADol (ULTRAM) 50 MG tablet Take 1 tablet (50 mg total) by mouth every 12 (twelve) hours as needed for moderate pain or severe pain. 50 tablet 1  .  isosorbide-hydrALAZINE (BIDIL) 20-37.5 MG tablet Take 1 tablet by mouth 2 (two) times daily. Reported on 10/28/2015     No current facility-administered medications on file prior to visit.      Review of Systems  Constitutional: Negative for activity change and appetite change.  HENT: Negative for sinus pressure and sore throat.   Eyes: Negative for visual disturbance.  Respiratory: Negative for cough, chest tightness and shortness of breath.   Cardiovascular: Negative for chest pain and leg swelling.  Gastrointestinal: Negative for abdominal pain, diarrhea, constipation and abdominal distention.  Endocrine: Negative.   Genitourinary: Negative for dysuria.  Musculoskeletal: Positive for neck pain. Negative for myalgias and joint swelling.  Skin: Negative for rash.  Allergic/Immunologic: Negative.   Neurological: Positive for light-headedness. Negative for weakness and numbness.  Psychiatric/Behavioral: Negative for suicidal ideas and dysphoric mood.       Objective: Filed Vitals:   10/28/15 0926  BP: 100/78  Pulse: 97  Temp: 97.6 F (36.4 C)  TempSrc: Oral  Resp: 20  Height: 5\' 8"  (1.727 m)  Weight: 171 lb (77.565 kg)  SpO2: 99%      Physical Exam  Constitutional: He is oriented to person, place, and time. He appears well-developed and well-nourished.  Cardiovascular: Normal rate, normal heart sounds and intact distal pulses.   No murmur heard. Pulmonary/Chest: Effort normal and breath sounds normal. He has no wheezes. He has no rales. He exhibits no tenderness.  Abdominal: Soft. Bowel sounds are normal. He exhibits no distension and no mass. There is no tenderness.  Musculoskeletal: Normal range of motion. He exhibits tenderness (mild tenderness on left to right rotation of the neck and on deep palpation of the right side of the neck.).  Neurological: He is alert and oriented to person, place, and time.      Wt Readings from Last 3 Encounters:  10/28/15 171 lb  (77.565 kg)  10/14/15 172 lb (78.019 kg)  08/25/15 164 lb (74.39 kg)   CMP Latest Ref Rng 10/14/2015 09/08/2015 09/02/2015  Glucose 65 - 99 mg/dL 87 78 84  BUN 7 - 25 mg/dL 22 19 19   Creatinine 0.70 - 1.25 mg/dL 1.70(H) 1.28(H) 1.11  Sodium 135 - 146 mmol/L 141 139 139  Potassium 3.5 - 5.3 mmol/L 4.4 4.9 4.1  Chloride 98 - 110 mmol/L 105 104 102  CO2 20 - 31 mmol/L 25 27 26   Calcium 8.6 - 10.3 mg/dL 9.7 9.6 10.2  Total Protein 6.1 - 8.1 g/dL 7.3 - 7.7  Total Bilirubin 0.2 - 1.2 mg/dL 1.2 -  1.1  Alkaline Phos 40 - 115 U/L 80 - 128(H)  AST 10 - 35 U/L 24 - 22  ALT 9 - 46 U/L 15 - 15        Assessment & Plan:  Vertigo: Will place on meclizine. Advised to change positions slowly.  Hypotension: Blood pressure is still on the low side despite discontinuation of ramipril. We'll assess at next visit and if symptoms still persist on meclizine I will discontinue another antihypertensive.  Chronic kidney disease: Bump in creatinine from 1.28-1.7 in 5 weeks. Rise could be secondary to Lasix We'll send a basic metabolic panel at next office visit  Congestive heart failure: EF 25-30%. No evidence of fluid overload. Continue medications. Keep appointment with cardiology.

## 2015-10-28 NOTE — Patient Instructions (Signed)
Benign Positional Vertigo Vertigo is the feeling that you or your surroundings are moving when they are not. Benign positional vertigo is the most common form of vertigo. The cause of this condition is not serious (is benign). This condition is triggered by certain movements and positions (is positional). This condition can be dangerous if it occurs while you are doing something that could endanger you or others, such as driving.  CAUSES In many cases, the cause of this condition is not known. It may be caused by a disturbance in an area of the inner ear that helps your brain to sense movement and balance. This disturbance can be caused by a viral infection (labyrinthitis), head injury, or repetitive motion. RISK FACTORS This condition is more likely to develop in:  Women.  People who are 50 years of age or older. SYMPTOMS Symptoms of this condition usually happen when you move your head or your eyes in different directions. Symptoms may start suddenly, and they usually last for less than a minute. Symptoms may include:  Loss of balance and falling.  Feeling like you are spinning or moving.  Feeling like your surroundings are spinning or moving.  Nausea and vomiting.  Blurred vision.  Dizziness.  Involuntary eye movement (nystagmus). Symptoms can be mild and cause only slight annoyance, or they can be severe and interfere with daily life. Episodes of benign positional vertigo may return (recur) over time, and they may be triggered by certain movements. Symptoms may improve over time. DIAGNOSIS This condition is usually diagnosed by medical history and a physical exam of the head, neck, and ears. You may be referred to a health care provider who specializes in ear, nose, and throat (ENT) problems (otolaryngologist) or a provider who specializes in disorders of the nervous system (neurologist). You may have additional testing, including:  MRI.  A CT scan.  Eye movement tests. Your  health care provider may ask you to change positions quickly while he or she watches you for symptoms of benign positional vertigo, such as nystagmus. Eye movement may be tested with an electronystagmogram (ENG), caloric stimulation, the Dix-Hallpike test, or the roll test.  An electroencephalogram (EEG). This records electrical activity in your brain.  Hearing tests. TREATMENT Usually, your health care provider will treat this by moving your head in specific positions to adjust your inner ear back to normal. Surgery may be needed in severe cases, but this is rare. In some cases, benign positional vertigo may resolve on its own in 2-4 weeks. HOME CARE INSTRUCTIONS Safety  Move slowly.Avoid sudden body or head movements.  Avoid driving.  Avoid operating heavy machinery.  Avoid doing any tasks that would be dangerous to you or others if a vertigo episode would occur.  If you have trouble walking or keeping your balance, try using a cane for stability. If you feel dizzy or unstable, sit down right away.  Return to your normal activities as told by your health care provider. Ask your health care provider what activities are safe for you. General Instructions  Take over-the-counter and prescription medicines only as told by your health care provider.  Avoid certain positions or movements as told by your health care provider.  Drink enough fluid to keep your urine clear or pale yellow.  Keep all follow-up visits as told by your health care provider. This is important. SEEK MEDICAL CARE IF:  You have a fever.  Your condition gets worse or you develop new symptoms.  Your family or friends   notice any behavioral changes.  Your nausea or vomiting gets worse.  You have numbness or a "pins and needles" sensation. SEEK IMMEDIATE MEDICAL CARE IF:  You have difficulty speaking or moving.  You are always dizzy.  You faint.  You develop severe headaches.  You have weakness in your  legs or arms.  You have changes in your hearing or vision.  You develop a stiff neck.  You develop sensitivity to light.   This information is not intended to replace advice given to you by your health care provider. Make sure you discuss any questions you have with your health care provider.   Document Released: 04/11/2006 Document Revised: 03/25/2015 Document Reviewed: 10/27/2014 Elsevier Interactive Patient Education 2016 [employer]  

## 2015-11-01 ENCOUNTER — Emergency Department (HOSPITAL_COMMUNITY): Payer: Medicaid Other

## 2015-11-01 ENCOUNTER — Encounter (HOSPITAL_COMMUNITY): Payer: Self-pay | Admitting: *Deleted

## 2015-11-01 ENCOUNTER — Emergency Department (HOSPITAL_COMMUNITY)
Admission: EM | Admit: 2015-11-01 | Discharge: 2015-11-01 | Disposition: A | Discharge status: Home / Self Care | Payer: Medicaid Other | Attending: Emergency Medicine | Admitting: Emergency Medicine

## 2015-11-01 DIAGNOSIS — Z9889 Other specified postprocedural states: Secondary | ICD-10-CM | POA: Diagnosis not present

## 2015-11-01 DIAGNOSIS — Z590 Homelessness: Secondary | ICD-10-CM | POA: Insufficient documentation

## 2015-11-01 DIAGNOSIS — Z87891 Personal history of nicotine dependence: Secondary | ICD-10-CM | POA: Diagnosis not present

## 2015-11-01 DIAGNOSIS — I129 Hypertensive chronic kidney disease with stage 1 through stage 4 chronic kidney disease, or unspecified chronic kidney disease: Secondary | ICD-10-CM | POA: Insufficient documentation

## 2015-11-01 DIAGNOSIS — F419 Anxiety disorder, unspecified: Secondary | ICD-10-CM | POA: Insufficient documentation

## 2015-11-01 DIAGNOSIS — F329 Major depressive disorder, single episode, unspecified: Secondary | ICD-10-CM | POA: Diagnosis not present

## 2015-11-01 DIAGNOSIS — E7800 Pure hypercholesterolemia, unspecified: Secondary | ICD-10-CM | POA: Diagnosis not present

## 2015-11-01 DIAGNOSIS — Z7952 Long term (current) use of systemic steroids: Secondary | ICD-10-CM | POA: Insufficient documentation

## 2015-11-01 DIAGNOSIS — Z7982 Long term (current) use of aspirin: Secondary | ICD-10-CM | POA: Insufficient documentation

## 2015-11-01 DIAGNOSIS — R0789 Other chest pain: Secondary | ICD-10-CM | POA: Insufficient documentation

## 2015-11-01 DIAGNOSIS — F1110 Opioid abuse, uncomplicated: Secondary | ICD-10-CM | POA: Diagnosis not present

## 2015-11-01 DIAGNOSIS — Z79899 Other long term (current) drug therapy: Secondary | ICD-10-CM | POA: Diagnosis not present

## 2015-11-01 DIAGNOSIS — I252 Old myocardial infarction: Secondary | ICD-10-CM | POA: Insufficient documentation

## 2015-11-01 DIAGNOSIS — Z8673 Personal history of transient ischemic attack (TIA), and cerebral infarction without residual deficits: Secondary | ICD-10-CM | POA: Diagnosis not present

## 2015-11-01 DIAGNOSIS — N184 Chronic kidney disease, stage 4 (severe): Secondary | ICD-10-CM | POA: Insufficient documentation

## 2015-11-01 DIAGNOSIS — R079 Chest pain, unspecified: Secondary | ICD-10-CM | POA: Diagnosis present

## 2015-11-01 DIAGNOSIS — Z8701 Personal history of pneumonia (recurrent): Secondary | ICD-10-CM | POA: Insufficient documentation

## 2015-11-01 DIAGNOSIS — Z7901 Long term (current) use of anticoagulants: Secondary | ICD-10-CM | POA: Insufficient documentation

## 2015-11-01 DIAGNOSIS — M1990 Unspecified osteoarthritis, unspecified site: Secondary | ICD-10-CM | POA: Diagnosis not present

## 2015-11-01 DIAGNOSIS — F1410 Cocaine abuse, uncomplicated: Secondary | ICD-10-CM | POA: Diagnosis not present

## 2015-11-01 LAB — D-DIMER, QUANTITATIVE (NOT AT ARMC)

## 2015-11-01 LAB — COMPREHENSIVE METABOLIC PANEL
ALBUMIN: 4 g/dL (ref 3.5–5.0)
ALT: 17 U/L (ref 17–63)
AST: 29 U/L (ref 15–41)
Alkaline Phosphatase: 71 U/L (ref 38–126)
Anion gap: 12 (ref 5–15)
BUN: 19 mg/dL (ref 6–20)
CHLORIDE: 106 mmol/L (ref 101–111)
CO2: 22 mmol/L (ref 22–32)
CREATININE: 1.53 mg/dL — AB (ref 0.61–1.24)
Calcium: 9 mg/dL (ref 8.9–10.3)
GFR calc non Af Amer: 47 mL/min — ABNORMAL LOW (ref >60)
GFR, EST AFRICAN AMERICAN: 55 mL/min — AB (ref >60)
Glucose, Bld: 106 mg/dL — ABNORMAL HIGH (ref 65–99)
Potassium: 4.1 mmol/L (ref 3.5–5.1)
SODIUM: 140 mmol/L (ref 135–145)
Total Bilirubin: 0.9 mg/dL (ref 0.3–1.2)
Total Protein: 7.2 g/dL (ref 6.5–8.1)

## 2015-11-01 LAB — CBC WITH DIFFERENTIAL/PLATELET
BASOS ABS: 0 10*3/uL (ref 0.0–0.1)
BASOS PCT: 1 %
EOS ABS: 0.2 10*3/uL (ref 0.0–0.7)
EOS PCT: 3 %
HCT: 39.8 % (ref 39.0–52.0)
Hemoglobin: 13.1 g/dL (ref 13.0–17.0)
LYMPHS ABS: 1.9 10*3/uL (ref 0.7–4.0)
Lymphocytes Relative: 26 %
MCH: 32.9 pg (ref 26.0–34.0)
MCHC: 32.9 g/dL (ref 30.0–36.0)
MCV: 100 fL (ref 78.0–100.0)
Monocytes Absolute: 0.9 10*3/uL (ref 0.1–1.0)
Monocytes Relative: 12 %
Neutro Abs: 4.2 10*3/uL (ref 1.7–7.7)
Neutrophils Relative %: 58 %
PLATELETS: 211 10*3/uL (ref 150–400)
RBC: 3.98 MIL/uL — AB (ref 4.22–5.81)
RDW: 13.5 % (ref 11.5–15.5)
WBC: 7.3 10*3/uL (ref 4.0–10.5)

## 2015-11-01 LAB — RAPID URINE DRUG SCREEN, HOSP PERFORMED
Amphetamines: NOT DETECTED
BENZODIAZEPINES: NOT DETECTED
Barbiturates: NOT DETECTED
Cocaine: POSITIVE — AB
Opiates: POSITIVE — AB
Tetrahydrocannabinol: NOT DETECTED

## 2015-11-01 LAB — I-STAT CHEM 8, ED
BUN: 20 mg/dL (ref 6–20)
Calcium, Ion: 1.15 mmol/L (ref 1.13–1.30)
Chloride: 104 mmol/L (ref 101–111)
Creatinine, Ser: 1.8 mg/dL — ABNORMAL HIGH (ref 0.61–1.24)
GLUCOSE: 98 mg/dL (ref 65–99)
HCT: 45 % (ref 39.0–52.0)
HEMOGLOBIN: 15.3 g/dL (ref 13.0–17.0)
POTASSIUM: 4.1 mmol/L (ref 3.5–5.1)
Sodium: 143 mmol/L (ref 135–145)
TCO2: 20 mmol/L (ref 0–100)

## 2015-11-01 LAB — I-STAT TROPONIN, ED
TROPONIN I, POC: 0.02 ng/mL (ref 0.00–0.08)
TROPONIN I, POC: 0.02 ng/mL (ref 0.00–0.08)

## 2015-11-01 MED ORDER — MORPHINE SULFATE (PF) 4 MG/ML IV SOLN
6.0000 mg | Freq: Once | INTRAVENOUS | Status: AC
  Administered 2015-11-01: 6 mg via INTRAVENOUS
  Filled 2015-11-01: qty 2

## 2015-11-01 MED ORDER — MORPHINE SULFATE (PF) 4 MG/ML IV SOLN: 4.0000 mg | Freq: Once | INTRAVENOUS | Status: DC

## 2015-11-01 MED ORDER — MORPHINE SULFATE (PF) 4 MG/ML IV SOLN
4.0000 mg | Freq: Once | INTRAVENOUS | Status: AC
  Administered 2015-11-01: 4 mg via INTRAVENOUS
  Filled 2015-11-01: qty 1

## 2015-11-01 MED ORDER — ACETAMINOPHEN 325 MG PO TABS
650.0000 mg | ORAL_TABLET | Freq: Once | ORAL | Status: AC
  Administered 2015-11-01: 650 mg via ORAL
  Filled 2015-11-01: qty 2

## 2015-11-01 NOTE — Discharge Instructions (Signed)
Community Resource Guide Outpatient Counseling/Substance Abuse Adult °The United Way’s “211” is a great source of information about community services available.  Access by dialing 2-1-1 from anywhere in [place], or by website -  www.nc211.org.  ° °Other Local Resources (Updated 07/2015) ° °Crisis Hotlines °  °Services  ° °  °Area Served  °[facility] • Crisis Hotline, available 24 hours a day, 7 days a week: 800-939-5911 [place], [place]  ° [facility] • Crisis Hotline, available 24 hours a day, 7 days a week: 866-275-9552 [place], [place]  °[facility] • Suicide Prevention Hotline, available 24 hours a day, 7 days a week: 800-273-8255 [place], [place]  °[facility] ° • Crisis Hotline, available 24 hours a day, 7 days a week: 336-676-6840 [place], [place] °  °[facility] • Crisis Hotline, available 24 hours a day, 7 days a week: 800-256-2452 All °  °[facility] • Crisis Hotline, available 24 hours a day, 7 days a week: 877-626-1772 All  ° °Other Local Resources (Updated 07/2015) ° °Outpatient Counseling/ Substance Abuse Programs  °Services  ° °  °Address and Phone Number  °[facility] ° • Options include Individual counseling, group counseling, intensive outpatient program (several hours a day, several days a week) °• Offers depression assessments °• Provides methadone maintenance program 336-333-6860 °[address], [address] °[place], [place] [address] °  °[facility] ° • Offers partial hospitalization/day treatment and DUI/DWI programs °• Accepts Medicare, private insurance [phone] °[address], [address] °[place], [place] [address]  °[facility] ° ° • Services include intensive outpatient program (several hours a day, several days a week), outpatient treatment, DUI/DWI services, family education °• Also has some services specifically for Veterans °• Offers transitional housing   [phone] °[address] °[place], [place] [address] °  °  °[facility] • Accepts Medicare, private pay, and private insurance [phone] °[address] °[place], [place] [address]  °[facility] • Services include individual counseling, substance abuse intensive outpatient program (several hours a day, several days a week), day treatment °• Accepts Medicare, Medicaid, private insurance [phone] °[address] °[place], [place] [address]  °[facility] ° • Offers substance abuse intensive outpatient program (several hours a day, several days a week), partial hospitalization program [phone] °[address] °[place], [place] [address] ° °[phone] °[address] °[place], [place] [address] ° °[phone] °[address] °, [place] [address] ° °[phone] °[address] °[place], [place] [address]  °[facility] • Individual counseling only °• Accepts private insurance only [phone] °[address] °[place], [place] [address]  °[facility] • Methadone maintenance program [phone] °[address] °[place], [place] [address]  °[facility] • [facility] Clinic providing substance abuse and mental health counseling °• Accepts Medicaid, Medicare, private insurance °• Offers sliding scale for uninsured 336-342-8316 °[address] °[place], [place]   °[facility] • Offers individual counseling, and intensive in-home services [phone] °[address] °[place], [place] [address]  °[facility] • Offers individual counseling, family counseling, group therapy, domestic violence counseling, consumer credit counseling °• Accepts Medicare, Medicaid, private insurance °• Offers sliding scale for uninsured 336-387-6161 °[address] °, [place] [address] ° °336-889-6161 °[place], [address] °[place],  [address]  °[facility] • Offers individual, family   and group counseling  3 locations - [place], [place], and [place]  [place] [address], [place] [address]  [address] [place], [place] [address]  [place], [place] [address]  [facility]    Offers psychiatric assessment, 8-week Intensive Outpatient Program (several hours a day, several times a week, daytime or evenings), early recovery group, family Program, medication management  Private pay or private insurance only [phone], or  [phone] [place], [place] [address]  [facility]  Offers individual, couples and family counseling  Accepts Medicaid, private insurance, and sliding scale for uninsured [phone] [address], [place] [address]  Launa Flight, MD  Individual counseling  Private insurance [phone] [place], [place] [address]  [facility]   Offers assessment, substance abuse treatment, and behavioral health treatment [phone] [address], [place] [address]  [facility]  Individual counseling  Accepts private insurance [phone] [place], [place] [address]  [facility]  Individual counseling  Blinda Leatherwood, private insurance [phone] [place], [place] [address]  [facility]    Offers intensive outpatient program (several hours a day, several times a week)  Private pay, private insurance [phone] [place], [place]  Individual counseling  Medicare, private insurance [phone] [address] [place], [place] [address]  [facility]    Offers intensive outpatient program (several hours a day, several times a week) and partial hospitalization  program [phone] [place], [place] [address]  Letta Moynahan, MD  Individual counseling [phone] [address] [place], [place] [address]  [facility] counseling to individuals, couples, and families  Accepts Medicare and private insurance; offers sliding scale for uninsured [phone] [place], [place] [address]  [facility]  Christian counseling [phone] [address], [place], [place] [address]  [facility] crisis counseling, individual counseling, group therapy, in-home therapy, domestic violence services, day treatment, DWI services, Conservation officer, nature (CST), Assertive Community Treatment Team (ACTT), substance abuse Intensive Outpatient Program (several hours a day, several times a week)  2 locations - [place], [place] [address]  [phone] [address] [place], [place] [address]  [facility] and group therapy  [employer] insurance, [place], [place] [phone] [address] [place], [place]  [facility]  Offers individual and family counseling  Offers LGBTQ services  Accepts private insurance and private pay [phone] [place], [place] [address]  [facility]    Offers individual counseling, group therapy, and outpatient detox  Accepts private insurance [phone] [place], [place] Medicare, private insurance [phone] [address] [place], [place] [address]  [facility]  Accepts Medicare, private insurance [phone] [address], [place] [address]  [facility] [place] substance abuse  Intensive Outpatient Program (several hours a day, several times a week) [phone], or [phone] [place], [place]  Nonspecific Chest Pain   Take Tylenol as directed for pain. Call any of the numbers above to get help with her cocaine problem. You must stop using cocaine as cocaine can cause heart attacks and  strokes and early death Chest pain can be caused by many different conditions. There is always a chance that your pain could be related to something serious, such as a heart attack or a blood clot in your lungs. Chest pain can also be caused by conditions that are not life-threatening. If you have chest pain, it is very important to follow up with your health care provider. CAUSES  Chest pain can be caused by:  Heartburn.  Pneumonia or bronchitis.  Anxiety or stress.  Inflammation around your heart (pericarditis) or lung (pleuritis or pleurisy).  A blood clot in your lung.  A collapsed lung (pneumothorax). It can develop suddenly on its own (spontaneous pneumothorax) or from trauma to the chest.  Shingles infection (varicella-zoster virus).  Heart attack.  Damage to the bones, muscles, and cartilage that make up your chest wall. This can include:  Bruised bones due to injury.  Strained muscles or cartilage due to frequent or repeated coughing or overwork.  Fracture to one or more ribs.  Sore cartilage due to inflammation (costochondritis). RISK FACTORS  Risk factors for chest pain may include:  Activities that increase your risk for trauma or injury to your chest.  Respiratory infections or conditions that cause frequent coughing.  Medical conditions or overeating that can cause heartburn.  Heart disease or family history of heart disease.  Conditions or health behaviors that increase your risk of developing a blood clot.  Having had chicken pox (varicella zoster). SIGNS AND SYMPTOMS Chest pain can feel like:  Burning or tingling on the surface of your  chest or deep in your chest.  Crushing, pressure, aching, or squeezing pain.  Dull or sharp pain that is worse when you move, cough, or take a deep breath.  Pain that is also felt in your back, neck, shoulder, or arm, or pain that spreads to any of these areas. Your chest pain may come and go, or it may stay constant. DIAGNOSIS Lab tests or other studies may be needed to find the cause of your pain. Your health care provider may have you take a test called an ambulatory ECG (electrocardiogram). An ECG records your heartbeat patterns at the time the test is performed. You may also have other tests, such as:  Transthoracic echocardiogram (TTE). During echocardiography, sound waves are used to create a picture of all of the heart structures and to look at how blood flows through your heart.  Transesophageal echocardiogram (TEE).This is a more advanced imaging test that obtains images from inside your body. It allows your health care provider to see your heart in finer detail.  Cardiac monitoring. This allows your health care provider to monitor your heart rate and rhythm in real time.  Holter monitor. This is a portable device that records your heartbeat and can help to diagnose abnormal heartbeats. It allows your health care provider to track your heart activity for several days, if needed.  Stress tests. These can be done through exercise or by taking medicine that makes your heart beat more quickly.  Blood tests.  Imaging tests. TREATMENT  Your treatment depends on what is causing your chest pain. Treatment may include:  Medicines. These may include:  Acid blockers for heartburn.  Anti-inflammatory medicine.  Pain medicine for inflammatory conditions.  Antibiotic medicine, if an infection is present.  Medicines to dissolve blood clots.  Medicines to treat coronary artery disease.  Supportive care for conditions that do not require medicines. This may  include:  Resting.  Applying  heat or cold packs to injured areas.  Limiting activities until pain decreases. HOME CARE INSTRUCTIONS  If you were prescribed an antibiotic medicine, finish it all even if you start to feel better.  Avoid any activities that bring on chest pain.  Do not use any tobacco products, including cigarettes, chewing tobacco, or electronic cigarettes. If you need help quitting, ask your health care provider.  Do not drink alcohol.  Take medicines only as directed by your health care provider.  Keep all follow-up visits as directed by your health care provider. This is important. This includes any further testing if your chest pain does not go away.  If heartburn is the cause for your chest pain, you may be told to keep your head raised (elevated) while sleeping. This reduces the chance that acid will go from your stomach into your esophagus.  Make lifestyle changes as directed by your health care provider. These may include:  Getting regular exercise. Ask your health care provider to suggest some activities that are safe for you.  Eating a heart-healthy diet. A registered dietitian can help you to learn healthy eating options.  Maintaining a healthy weight.  Managing diabetes, if necessary.  Reducing stress. SEEK MEDICAL CARE IF:  Your chest pain does not go away after treatment.  You have a rash with blisters on your chest.  You have a fever. SEEK IMMEDIATE MEDICAL CARE IF:   Your chest pain is worse.  You have an increasing cough, or you cough up blood.  You have severe abdominal pain.  You have severe weakness.  You faint.  You have chills.  You have sudden, unexplained chest discomfort.  You have sudden, unexplained discomfort in your arms, back, neck, or jaw.  You have shortness of breath at any time.  You suddenly start to sweat, or your skin gets clammy.  You feel nauseous or you vomit.  You suddenly feel light-headed or  dizzy.  Your heart begins to beat quickly, or it feels like it is skipping beats. These symptoms may represent a serious problem that is an emergency. Do not wait to see if the symptoms will go away. Get medical help right away. Call your local emergency services (911 in the [place] Do not drive yourself to the hospital.   This information is not intended to replace advice given to you by your health care provider. Make sure you discuss any questions you have with your health care provider.   Document Released: 04/13/2005 Document Revised: 07/25/2014 Document Reviewed: 02/07/2014 Elsevier Interactive Patient Education [employer].

## 2015-11-01 NOTE — ED Notes (Signed)
Pt given urinal and encouraged to provide a urine specimen.

## 2015-11-01 NOTE — ED Notes (Signed)
Patient transported to X-ray 

## 2015-11-01 NOTE — ED Notes (Signed)
Pt again encouraged to provide a urine specimen.

## 2015-11-01 NOTE — ED Provider Notes (Addendum)
CSN: [id number]     Arrival date & time 11/01/15  0910 History   None    Chief Complaint  Patient presents with  . Chest Pain     (Consider location/radiation/quality/duration/timing/severity/associated sxs/prior Treatment) HPI Complains of pleuritic chest pain substernal nonradiating onset 1 hour ago prior to coming here. Brought by EMS. Has treated himself with 2 baby aspirins and 2 sublingual nitroglycerin without relief prior to coming here EMS treated patient with 2 baby aspirins. No other associated symptoms. Pain worse with deep inspiration not improved by anything. Moderate to severe at present. Does not feel like "heart attack" he's had in the past. Denies use of illicit substances Past Medical History  Diagnosis Date  . Hypertension   . Homelessness   . Urinary hesitancy   . Hypercholesterolemia   . NSTEMI (non-ST elevated myocardial infarction) ([place]) 01/07/2015  . Heart attack ([place]) 07/2014  . Walking pneumonia 07/2014  . Headache     "probably weekly" (01/07/2015)  . Arthritis     "all over" (01/07/2015)  . Anxiety   . Depression   . Noncompliance   . Polysubstance abuse     etoh, cocaine  . Ischemic dilated cardiomyopathy   . CKD (chronic kidney disease), stage IV ([place])   . History of echocardiogram 12/2014    EF 20-25%   Past Surgical History  Procedure Laterality Date  . Left heart catheterization with coronary angiogram N/A 08/15/2014    Procedure: LEFT HEART CATHETERIZATION WITH CORONARY ANGIOGRAM;  Surgeon: Clent Demark, MD;  Location[facility] C[facility] LAB;  Service: Cardiovascular;  Laterality: N/A;  . Cardiac catheterization     No family history on file. Social History  Substance Use Topics  . Smoking status: Former Smoker -- 1.50 packs/day for 30 years    Types: Cigarettes    Quit date: 02/19/2004  . Smokeless tobacco: Never Used  . Alcohol Use: No     Comment: social    Review of Systems  Constitutional: Negative.   HENT: Negative.   Respiratory:  Negative.   Cardiovascular: Positive for chest pain.  Gastrointestinal: Negative.   Musculoskeletal: Negative.   Skin: Negative.   Neurological: Negative.   Psychiatric/Behavioral: Negative.   All other systems reviewed and are negative.     Allergies  Review of patient's allergies indicates no known allergies.  Home Medications   Prior to Admission medications   Medication Sig Start Date End Date Taking? Authorizing Provider  allopurinol (ZYLOPRIM) 300 MG tablet Take 300 mg by mouth daily. Reported on 07/15/2015 06/09/15   Historical Provider, MD  aspirin EC 81 MG tablet Take 81 mg by mouth daily.    Historical Provider, MD  atorvastatin (LIPITOR) 80 MG tablet Take 80 mg by mouth daily.    Historical Provider, MD  betamethasone valerate (VALISONE) 0.1 % cream Apply topically 2 (two) times daily. 09/10/15   Kinnie Feil, MD  carvedilol (COREG) 3.125 MG tablet Take 1 tablet (3.125 mg total) by mouth 2 (two) times daily with a meal. Discontinue 6.25mg  03/24/15   [name], MD  clopidogrel (PLAVIX) 75 MG tablet Take 75 mg by mouth daily. 07/15/15   Historical Provider, MD  colchicine 0.6 MG tablet Take 1 tablet (0.6 mg total) by mouth 2 (two) times daily. 08/02/15   Leo Grosser, MD  diphenhydrAMINE (BENADRYL) 25 MG tablet Take 1 tablet (25 mg total) by mouth every 6 (six) hours. 06/24/15   Leonard Schwartz, MD  furosemide (LASIX) 40 MG tablet Take 2 tablets (80  mg total) by mouth 2 (two) times daily. Patient taking differently: Take 40 mg by mouth 2 (two) times daily.  07/22/15   [name], MD  isosorbide-hydrALAZINE (BIDIL) 20-37.5 MG tablet Take 1 tablet by mouth 2 (two) times daily. Reported on 10/28/2015    Historical Provider, MD  meclizine (ANTIVERT) 25 MG tablet Take 1 tablet (25 mg total) by mouth 3 (three) times daily as needed for dizziness. 10/28/15   [name], MD  nitroGLYCERIN (NITROSTAT) 0.4 MG SL tablet Place 1 tablet (0.4 mg total) under the tongue every 5 (five)  minutes x 3 doses as needed for chest pain. 08/16/14   Dixie Dials, MD  ranitidine (ZANTAC) 150 MG tablet Take 1 tablet (150 mg total) by mouth 2 (two) times daily. 07/15/15   [name], MD  tamsulosin (FLOMAX) 0.4 MG CAPS capsule Take 0.4 mg by mouth daily. Reported on 07/11/2015 07/07/15   Historical Provider, MD  tiZANidine (ZANAFLEX) 4 MG tablet Take 1 tablet (4 mg total) by mouth every 8 (eight) hours as needed for muscle spasms. 10/28/15   [name], MD  traMADol (ULTRAM) 50 MG tablet Take 1 tablet (50 mg total) by mouth every 12 (twelve) hours as needed for moderate pain or severe pain. 08/25/15   [name], MD   There were no vitals taken for this visit. Physical Exam  Constitutional: He appears well-developed and well-nourished.  HENT:  Head: Normocephalic and atraumatic.  Eyes: Conjunctivae are normal. Pupils are equal, round, and reactive to light.  Neck: Neck supple. No tracheal deviation present. No thyromegaly present.  Cardiovascular: Normal rate and regular rhythm.   No murmur heard. Pulmonary/Chest: Effort normal and breath sounds normal.  Abdominal: Soft. Bowel sounds are normal. He exhibits no distension. There is no tenderness.  Musculoskeletal: Normal range of motion. He exhibits no edema or tenderness.  Neurological: He is alert. Coordination normal.  Skin: Skin is warm and dry. No rash noted.  Psychiatric: He has a normal mood and affect.  Nursing note and vitals reviewed.   ED Course  Procedures (including critical care time) Labs Review Labs Reviewed - No data to display  Imaging Review No results found. I have personally reviewed and evaluated these images and lab results as part of my medical decision-making.   EKG Interpretation   Date/Time:  Sunday November 01 2015 09:16:50 EDT Ventricular Rate:  94 PR Interval:  203 QRS Duration: 193 QT Interval:  413 QTC Calculation: 516 R Axis:   -98 Text Interpretation:  Sinus rhythm Probable left atrial  enlargement  Nonspecific IVCD with LAD No significant change since last tracing  Confirmed by Winfred Leeds  MD, Mathew Storck [phone]) on 11/01/2015 9:20:25 AM       Results for orders placed or performed during the hospital encounter of 11/01/15  CBC with Differential  Result Value Ref Range   WBC 7.3 4.0 - 10.5 K/uL   RBC 3.98 (L) 4.22 - 5.81 MIL/uL   Hemoglobin 13.1 13.0 - 17.0 g/dL   HCT 39.8 39.0 - 52.0 %   MCV 100.0 78.0 - 100.0 fL   MCH 32.9 26.0 - 34.0 pg   MCHC 32.9 30.0 - 36.0 g/dL   RDW 13.5 11.5 - 15.5 %   Platelets 211 150 - 400 K/uL   Neutrophils Relative % 58 %   Neutro Abs 4.2 1.7 - 7.7 K/uL   Lymphocytes Relative 26 %   Lymphs Abs 1.9 0.7 - 4.0 K/uL   Monocytes Relative 12 %  Monocytes Absolute 0.9 0.1 - 1.0 K/uL   Eosinophils Relative 3 %   Eosinophils Absolute 0.2 0.0 - 0.7 K/uL   Basophils Relative 1 %   Basophils Absolute 0.0 0.0 - 0.1 K/uL  Comprehensive metabolic panel  Result Value Ref Range   Sodium 140 135 - 145 mmol/L   Potassium 4.1 3.5 - 5.1 mmol/L   Chloride 106 101 - 111 mmol/L   CO2 22 22 - 32 mmol/L   Glucose, Bld 106 (H) 65 - 99 mg/dL   BUN 19 6 - 20 mg/dL   Creatinine, Ser 1.53 (H) 0.61 - 1.24 mg/dL   Calcium 9.0 8.9 - 10.3 mg/dL   Total Protein 7.2 6.5 - 8.1 g/dL   Albumin 4.0 3.5 - 5.0 g/dL   AST 29 15 - 41 U/L   ALT 17 17 - 63 U/L   Alkaline Phosphatase 71 38 - 126 U/L   Total Bilirubin 0.9 0.3 - 1.2 mg/dL   GFR calc non Af Amer 47 (L) >60 mL/min   GFR calc Af Amer 55 (L) >60 mL/min   Anion gap 12 5 - 15  D-dimer, quantitative (not at [facility])  Result Value Ref Range   D-Dimer, Quant <0.27 0.00 - 0.50 ug/mL-FEU  Urine rapid drug screen (hosp performed)  Result Value Ref Range   Opiates POSITIVE (A) NONE DETECTED   Cocaine POSITIVE (A) NONE DETECTED   Benzodiazepines NONE DETECTED NONE DETECTED   Amphetamines NONE DETECTED NONE DETECTED   Tetrahydrocannabinol NONE DETECTED NONE DETECTED   Barbiturates NONE DETECTED NONE DETECTED  I-Stat  Troponin, ED (not at [facility])  Result Value Ref Range   Troponin i, poc 0.02 0.00 - 0.08 ng/mL   Comment 3          I-stat troponin, ED  Result Value Ref Range   Troponin i, poc 0.02 0.00 - 0.08 ng/mL   Comment 3          I-stat chem 8, ed  Result Value Ref Range   Sodium 143 135 - 145 mmol/L   Potassium 4.1 3.5 - 5.1 mmol/L   Chloride 104 101 - 111 mmol/L   BUN 20 6 - 20 mg/dL   Creatinine, Ser 1.80 (H) 0.61 - 1.24 mg/dL   Glucose, Bld 98 65 - 99 mg/dL   Calcium, Ion 1.15 1.13 - 1.30 mmol/L   TCO2 20 0 - 100 mmol/L   Hemoglobin 15.3 13.0 - 17.0 g/dL   HCT 45.0 39.0 - 52.0 %   Dg Chest 2 View  11/01/2015  CLINICAL DATA:  60 year old male with a history of chest pain EXAM: CHEST - 2 VIEW COMPARISON:  05/22/2015 FINDINGS: Cardiomediastinal silhouette projects within normal limits in size and contour. No confluent airspace disease, pneumothorax, or pleural effusion. Interval removal of right IJ catheter. No displaced fracture. Unremarkable appearance of the upper abdomen. IMPRESSION: No radiographic evidence of acute cardiopulmonary disease. Signed, Dulcy Fanny. Earleen Newport, DO [facility] Electronically Signed   By: Corrie Mckusick D.O.   On: 11/01/2015 11:42    1:30 PM pain not improved after treatment with intravenous morphine. On further questioning of patient he admits to using cocaine last night.  MDM  I strongly doubt acute coronary syndrome with highly atypical symptoms, pleuritic chest pain nonacute EKG, 2 negative troponins. Doubt aortic dissection. Blood pressure similar in both arms. Atypical symptoms. Low pretest probability of pulmonary embolism on a negative d-dimer. Renal insufficiency is chronic. He is referred back to  [facility] in [place] or Tylenol for pain Final diagnoses:  None  Plan Tylenol for pain. I had lengthy conversation with patient that he needs to stop using cocaine.Also given resource guide  for substance abuse Diagnosis #1 atypical chest pain #2 cocaine abuse #3 renal insufficiency     Orlie Dakin, MD 11/01/15 [place], MD 11/01/15 [phone]

## 2015-11-01 NOTE — ED Notes (Signed)
Pt states he began having centralized non radiating cp about 90 minutes ago. Pt took 2 nitro and 2 baby asa prior to ems arrival. Pt took ED meds last night, nitro not given by EMS, pt did receive 2 more baby ASA. Pt states he does drink and drank 3 40 oz beers.

## 2015-11-01 NOTE — ED Notes (Signed)
Pt states CP increases with inspiration.

## 2015-11-01 NOTE — ED Notes (Signed)
Pt again encouraged to give a urine specimen. Pt informed we will have to do an I&O if he is unable to provide a urine.

## 2015-11-01 NOTE — ED Notes (Signed)
Called xray and informed them pt is ready for transport.

## 2015-11-01 NOTE — ED Notes (Signed)
Pt is in stable condition upon d/c and ambulates from ED. 

## 2015-11-02 ENCOUNTER — Telehealth: Payer: Self-pay | Admitting: Family Medicine

## 2015-11-02 MED ORDER — NITROGLYCERIN 0.4 MG SL SUBL
0.4000 mg | SUBLINGUAL_TABLET | SUBLINGUAL | Status: DC | PRN
Start: 2015-11-02 — End: 2017-01-06

## 2015-11-02 MED FILL — CARVEDILOL 6.25 MG TABLET: 6.25 | 30 days supply | Qty: 60 | Fill #1 | Status: TO

## 2015-11-02 MED FILL — NITROSTAT 0.4 MG TABLET SL: 0.4 | 25 days supply | Qty: 25 | Fill #0

## 2015-11-02 NOTE — Telephone Encounter (Signed)
Patient came into office requesting medication refill on nitrostat 0.4 mg , please f/u with patient

## 2015-11-02 NOTE — Telephone Encounter (Signed)
Medication refilled x 1

## 2015-11-23 ENCOUNTER — Other Ambulatory Visit: Payer: Self-pay | Admitting: Family Medicine

## 2015-11-23 MED FILL — ATORVASTATIN 80 MG TABLET: 80 | 30 days supply | Qty: 30 | Fill #3 | Status: TO

## 2015-11-23 MED FILL — tiZANidine HCL 4 MG TABS: 4 | 20 days supply | Qty: 60 | Fill #0

## 2015-11-23 MED FILL — TAMSULOSIN HCL 0.4 MG CAP: 0.4 | 30 days supply | Qty: 30 | Fill #4 | Status: TO

## 2015-11-23 MED FILL — CLOPIDOGREL 75 MG TABLET: 75 | 30 days supply | Qty: 30 | Fill #4

## 2015-11-23 MED FILL — TRAVEL SICKNESS 25 MG TAB C: 25 | 30 days supply | Qty: 90 | Fill #0

## 2015-11-23 MED FILL — ALLOPURINOL 300 MG TABLET: 300 | 30 days supply | Qty: 30 | Fill #6 | Status: TO

## 2015-11-24 MED FILL — raNITIdine HCL 150 MG TABS: 150 | 30 days supply | Qty: 60 | Fill #0 | Status: TO

## 2015-11-24 MED FILL — FUROSEMIDE 40 MG TABLET: 40 | 15 days supply | Qty: 60 | Fill #1 | Status: TO

## 2015-12-08 ENCOUNTER — Encounter (HOSPITAL_COMMUNITY): Payer: Self-pay

## 2015-12-08 ENCOUNTER — Ambulatory Visit: Payer: Medicaid Other

## 2015-12-08 ENCOUNTER — Emergency Department (HOSPITAL_COMMUNITY): Payer: Medicaid Other

## 2015-12-08 ENCOUNTER — Other Ambulatory Visit: Payer: Self-pay

## 2015-12-08 ENCOUNTER — Emergency Department (HOSPITAL_COMMUNITY)
Admission: EM | Admit: 2015-12-08 | Discharge: 2015-12-08 | Disposition: A | Discharge status: Home / Self Care | Payer: Medicaid Other | Attending: Emergency Medicine | Admitting: Emergency Medicine

## 2015-12-08 DIAGNOSIS — Z590 Homelessness: Secondary | ICD-10-CM | POA: Diagnosis not present

## 2015-12-08 DIAGNOSIS — N189 Chronic kidney disease, unspecified: Secondary | ICD-10-CM | POA: Diagnosis not present

## 2015-12-08 DIAGNOSIS — R0602 Shortness of breath: Secondary | ICD-10-CM | POA: Diagnosis not present

## 2015-12-08 DIAGNOSIS — Z9119 Patient's noncompliance with other medical treatment and regimen: Secondary | ICD-10-CM | POA: Diagnosis not present

## 2015-12-08 DIAGNOSIS — I129 Hypertensive chronic kidney disease with stage 1 through stage 4 chronic kidney disease, or unspecified chronic kidney disease: Secondary | ICD-10-CM | POA: Diagnosis not present

## 2015-12-08 DIAGNOSIS — Z8701 Personal history of pneumonia (recurrent): Secondary | ICD-10-CM | POA: Insufficient documentation

## 2015-12-08 DIAGNOSIS — F329 Major depressive disorder, single episode, unspecified: Secondary | ICD-10-CM | POA: Insufficient documentation

## 2015-12-08 DIAGNOSIS — Z7982 Long term (current) use of aspirin: Secondary | ICD-10-CM | POA: Diagnosis not present

## 2015-12-08 DIAGNOSIS — I252 Old myocardial infarction: Secondary | ICD-10-CM | POA: Insufficient documentation

## 2015-12-08 DIAGNOSIS — Z7951 Long term (current) use of inhaled steroids: Secondary | ICD-10-CM | POA: Insufficient documentation

## 2015-12-08 DIAGNOSIS — E7800 Pure hypercholesterolemia, unspecified: Secondary | ICD-10-CM | POA: Diagnosis not present

## 2015-12-08 DIAGNOSIS — Z9889 Other specified postprocedural states: Secondary | ICD-10-CM | POA: Diagnosis not present

## 2015-12-08 DIAGNOSIS — Z79899 Other long term (current) drug therapy: Secondary | ICD-10-CM | POA: Insufficient documentation

## 2015-12-08 DIAGNOSIS — Z87891 Personal history of nicotine dependence: Secondary | ICD-10-CM | POA: Diagnosis not present

## 2015-12-08 DIAGNOSIS — F419 Anxiety disorder, unspecified: Secondary | ICD-10-CM | POA: Insufficient documentation

## 2015-12-08 LAB — BASIC METABOLIC PANEL
ANION GAP: 5 (ref 5–15)
BUN: 18 mg/dL (ref 6–20)
CO2: 24 mmol/L (ref 22–32)
CREATININE: 1.65 mg/dL — AB (ref 0.61–1.24)
Calcium: 9.6 mg/dL (ref 8.9–10.3)
Chloride: 111 mmol/L (ref 101–111)
GFR calc Af Amer: 50 mL/min — ABNORMAL LOW (ref >60)
GFR, EST NON AFRICAN AMERICAN: 43 mL/min — AB (ref >60)
GLUCOSE: 92 mg/dL (ref 65–99)
Potassium: 4.2 mmol/L (ref 3.5–5.1)
Sodium: 140 mmol/L (ref 135–145)

## 2015-12-08 LAB — CBC WITH DIFFERENTIAL/PLATELET
BASOS ABS: 0.1 10*3/uL (ref 0.0–0.1)
BASOS PCT: 2 %
EOS PCT: 9 %
Eosinophils Absolute: 0.4 10*3/uL (ref 0.0–0.7)
HCT: 40.8 % (ref 39.0–52.0)
Hemoglobin: 13.1 g/dL (ref 13.0–17.0)
Lymphocytes Relative: 33 %
Lymphs Abs: 1.5 10*3/uL (ref 0.7–4.0)
MCH: 33.8 pg (ref 26.0–34.0)
MCHC: 32.1 g/dL (ref 30.0–36.0)
MCV: 105.2 fL — AB (ref 78.0–100.0)
MONO ABS: 0.4 10*3/uL (ref 0.1–1.0)
MONOS PCT: 10 %
Neutro Abs: 2.1 10*3/uL (ref 1.7–7.7)
Neutrophils Relative %: 46 %
PLATELETS: 178 10*3/uL (ref 150–400)
RBC: 3.88 MIL/uL — ABNORMAL LOW (ref 4.22–5.81)
RDW: 13.6 % (ref 11.5–15.5)
WBC: 4.5 10*3/uL (ref 4.0–10.5)

## 2015-12-08 LAB — I-STAT TROPONIN, ED: Troponin i, poc: 0.02 ng/mL (ref 0.00–0.08)

## 2015-12-08 LAB — BRAIN NATRIURETIC PEPTIDE: B Natriuretic Peptide: 1498.8 pg/mL — ABNORMAL HIGH (ref 0.0–100.0)

## 2015-12-08 MED ORDER — CARVEDILOL 3.125 MG PO TABS: 3.1250 mg | ORAL_TABLET | Freq: Two times a day (BID) | ORAL | Status: DC

## 2015-12-08 MED ORDER — FUROSEMIDE 40 MG PO TABS: 40.0000 mg | ORAL_TABLET | Freq: Three times a day (TID) | ORAL | Status: DC

## 2015-12-08 NOTE — ED Notes (Signed)
Patient here with increased shortness of breath x 2 weeks, denies chest pain. Cough for same and reports his SOB is worse at night. Speaking full sentences on arrival

## 2015-12-08 NOTE — ED Provider Notes (Signed)
Complains of nonproductive cough with shortness of breath for the past 2 weeks. This is worse with exertion. He denies any chest pain. Denies noncompliance with medications. Admits to two-pillow orthopnea. On exam no respiratory distress HEENT exam no facial asymmetry neck supple positive JVD lungs clear auscultation abdomen nondistended nontender extremities without edema skin warm dry  Orlie Dakin, MD 12/08/15 2008

## 2015-12-08 NOTE — ED Provider Notes (Signed)
CSN: [id number]     Arrival date & time 12/08/15  1413 History   First MD Initiated Contact with Patient 12/08/15 1547     Chief Complaint  Patient presents with  . Shortness of Breath   HPI  Mr. [name] is a 60 year old male with past history of hypertension, and NSTEMI, ischemic dilated cardiomyopathy, chronic kidney disease and polysubstance abuse presenting with shortness of breath. He reports worsening dyspnea on exertion over the past 2 weeks. He also endorses shortness of breath when lying flat states that he "jumps straight up in bed" in the middle the night because he cannot breathe. He denies associated chest pain or palpitations. He endorses a cough that is occasionally productive of clear phlegm but mostly dry. He is a former smoker. Reports chronic peripheral edema but denies this has worsened over the past two weeks. Denies unilateral calf swelling or tenderness. Denies other URI symptoms including nasal congestion, rhinorrhea, sore throat or sinus pressure. He reports a history of low blood pressure that his PCP is currently aware of and they are managing his medications for this. Denies dizziness or near-syncope. He has no other complaints today.   Past Medical History  Diagnosis Date  . Hypertension   . Homelessness   . Urinary hesitancy   . Hypercholesterolemia   . NSTEMI (non-ST elevated myocardial infarction) ([place]) 01/07/2015  . Heart attack ([place]) 07/2014  . Walking pneumonia 07/2014  . Headache     "probably weekly" (01/07/2015)  . Arthritis     "all over" (01/07/2015)  . Anxiety   . Depression   . Noncompliance   . Polysubstance abuse     etoh, cocaine  . Ischemic dilated cardiomyopathy   . CKD (chronic kidney disease), stage IV ([place])   . History of echocardiogram 12/2014    EF 20-25%   Past Surgical History  Procedure Laterality Date  . Left heart catheterization with coronary angiogram N/A 08/15/2014    Procedure: LEFT HEART CATHETERIZATION WITH CORONARY  ANGIOGRAM;  Surgeon: Clent Demark, MD;  Location: [facility] CATH LAB;  Service: Cardiovascular;  Laterality: N/A;  . Cardiac catheterization     No family history on file. Social History  Substance Use Topics  . Smoking status: Former Smoker -- 1.50 packs/day for 30 years    Types: Cigarettes    Quit date: 02/19/2004  . Smokeless tobacco: Never Used  . Alcohol Use: No     Comment: social    Review of Systems  All other systems reviewed and are negative.     Allergies  Review of patient's allergies indicates no known allergies.  Home Medications   Prior to Admission medications   Medication Sig Start Date End Date Taking? Authorizing Provider  albuterol (PROAIR HFA) 108 (90 Base) MCG/ACT inhaler Inhale 2 puffs into the lungs every 6 (six) hours as needed for wheezing or shortness of breath.   Yes Historical Provider, MD  allopurinol (ZYLOPRIM) 300 MG tablet Take 300 mg by mouth daily. Reported on 07/15/2015 06/09/15  Yes Historical Provider, MD  aspirin EC 81 MG tablet Take 81 mg by mouth daily.   Yes Historical Provider, MD  atorvastatin (LIPITOR) 80 MG tablet Take 80 mg by mouth daily.   Yes Historical Provider, MD  b complex vitamins tablet Take 1 tablet by mouth daily.   Yes Historical Provider, MD  betamethasone valerate (VALISONE) 0.1 % cream Apply topically 2 (two) times daily. Patient taking differently: Apply 1 application topically 2 (two) times daily as needed (  for skin).  09/10/15  Yes Kinnie Feil, MD  carvedilol (COREG) 6.25 MG tablet Take 6.25 mg by mouth 2 (two) times daily with a meal.   Yes Historical Provider, MD  clopidogrel (PLAVIX) 75 MG tablet Take 75 mg by mouth daily. 07/15/15  Yes Historical Provider, MD  colchicine 0.6 MG tablet Take 1 tablet (0.6 mg total) by mouth 2 (two) times daily. Patient taking differently: Take 0.6 mg by mouth 2 (two) times daily as needed (for gout flares).  08/02/15  Yes Leo Grosser, MD  diphenhydrAMINE (BENADRYL) 25 MG tablet  Take 1 tablet (25 mg total) by mouth every 6 (six) hours. Patient taking differently: Take 25 mg by mouth every 6 (six) hours as needed for itching.  06/24/15  Yes Leonard Schwartz, MD  furosemide (LASIX) 40 MG tablet Take 2 tablets (80 mg total) by mouth 2 (two) times daily. Patient taking differently: Take 40 mg by mouth 2 (two) times daily.  07/22/15  Yes [name], MD  Multiple Vitamins-Minerals (ONE-A-DAY 50 PLUS PO) Take 1 tablet by mouth daily.   Yes Historical Provider, MD  nitroGLYCERIN (NITROSTAT) 0.4 MG SL tablet Place 1 tablet (0.4 mg total) under the tongue every 5 (five) minutes x 3 doses as needed for chest pain. 11/02/15  Yes [name], MD  ranitidine (ZANTAC) 150 MG tablet TAKE 1 TABLET BY MOUTH 2 TIMES DAILY 11/23/15  Yes [name], MD  tamsulosin (FLOMAX) 0.4 MG CAPS capsule Take 0.4 mg by mouth daily. Reported on 07/11/2015 07/07/15  Yes Historical Provider, MD  tiZANidine (ZANAFLEX) 4 MG tablet TAKE 1 TABLET BY MOUTH EVERY 8 HOURS AS NEEDED FOR MUSCLE SPASMS. 11/23/15  Yes [name], MD  traMADol (ULTRAM) 50 MG tablet Take 1 tablet (50 mg total) by mouth every 12 (twelve) hours as needed for moderate pain or severe pain. 08/25/15  Yes [name], MD  TRAVEL SICKNESS 25 MG CHEW TAKE 1 TABLET BY MOUTH 3 TIMES DAILY AS NEEDED FOR DIZZINESS. 11/23/15  Yes [name], MD  carvedilol (COREG) 3.125 MG tablet Take 1 tablet (3.125 mg total) by mouth 2 (two) times daily with a meal. Discontinue 6.25mg  03/24/15   [name], MD  carvedilol (COREG) 3.125 MG tablet Take 1 tablet (3.125 mg total) by mouth 2 (two) times daily with a meal. 12/08/15   Joseph Ponciano, PA-C  furosemide (LASIX) 40 MG tablet Take 1 tablet (40 mg total) by mouth 3 (three) times daily. 12/08/15   Joseph Bribiesca, PA-C   BP 101/80 mmHg  Pulse 79  Temp(Src) 97.9 F (36.6 C) (Oral)  Resp 34  Ht 5\' 11"  (1.803 m)  Wt 77.111 kg  BMI 23.72 kg/m2  SpO2 96% Physical Exam  Constitutional: He appears well-developed and  well-nourished. No distress.  Nontoxic appearing  HENT:  Head: Normocephalic and atraumatic.  Mouth/Throat: Oropharynx is clear and moist.  Eyes: Conjunctivae are normal. Right eye exhibits no discharge. Left eye exhibits no discharge. No scleral icterus.  Neck: Normal range of motion. Neck supple. JVD present.  Cardiovascular: Normal rate, regular rhythm, normal heart sounds and intact distal pulses.   Minimal bilateral pitting edema. No unilateral calf swelling.   Pulmonary/Chest: Effort normal and breath sounds normal. No respiratory distress. He has no wheezes. He has no rales.  Abdominal: Soft. He exhibits no distension. There is no tenderness.  Musculoskeletal: Normal range of motion.  Neurological: He is alert. Coordination normal.  Skin: Skin is warm and dry.  Psychiatric: He has a normal  mood and affect. His behavior is normal.  Nursing note and vitals reviewed.   ED Course  Procedures (including critical care time) Labs Review Labs Reviewed  CBC WITH DIFFERENTIAL/PLATELET - Abnormal; Notable for the following:    RBC 3.88 (*)    MCV 105.2 (*)    All other components within normal limits  BASIC METABOLIC PANEL - Abnormal; Notable for the following:    Creatinine, Ser 1.65 (*)    GFR calc non Af Amer 43 (*)    GFR calc Af Amer 50 (*)    All other components within normal limits  BRAIN NATRIURETIC PEPTIDE - Abnormal; Notable for the following:    B Natriuretic Peptide 1498.8 (*)    All other components within normal limits  I-STAT TROPOININ, ED    Imaging Review Dg Chest 2 View  12/08/2015  CLINICAL DATA:  Shortness of breath EXAM: CHEST  2 VIEW COMPARISON:  11/01/2015 chest radiograph. FINDINGS: Stable cardiomediastinal silhouette with mild cardiomegaly. No pneumothorax. No pleural effusion. Lungs appear clear, with no acute consolidative airspace disease and no pulmonary edema. IMPRESSION: Stable mild cardiomegaly without pulmonary edema. No active pulmonary disease.  Electronically Signed   By: Ilona Sorrel M.D.   On: 12/08/2015 15:11   I have personally reviewed and evaluated these images and lab results as part of my medical decision-making.   EKG Interpretation   Date/Time:  Tuesday Dec 08 2015 18:13:29 EDT Ventricular Rate:  65 PR Interval:  251 QRS Duration: 203 QT Interval:  493 QTC Calculation: 513 R Axis:   -145 Text Interpretation:  Sinus rhythm Prolonged PR interval Nonspecific  intraventricular conduction delay No significant change since last tracing  Confirmed by Winfred Leeds  MD, SAM [phone]) on 12/08/2015 6:15:51 PM      MDM   Final diagnoses:  Shortness of breath   60 year old male presenting with shortness of breath and cough 2 weeks. Afebrile. Mildly hypotensive in high 90s/70s. Chart review shows that he is followed by his PCP for this and they're managing his medications. Blood pressure improved to 119/82 on ambulation. Patient is nontoxic-appearing. Breathing unlabored with some increased shortness of breath when lying supine. Heart regular rate and rhythm. Mild bilateral peripheral edema. Troponin negative with nonischemic EKG. Creatinine at his baseline. BNP elevated to 1400. No acute changes on chest x-ray; note mild cardiomegaly that is stable. Discussed with his cardiologist Dr. Terrence Dupont who is familiar with him. Recommends increasing his Lasix to 3 times a day and decreasing his Coreg by half. If patient is stable and ambulates without significant increased shortness breath or desaturation in emergency department, he feels comfortable sending him outpatient with follow-up in the morning. Patient ambulated with pulse ox without increased shortness of breath and oxygen remaining above 96%. Discussed plan with patient he states understanding and feels comfortable with the plan. Patient is to call Dr. Zenia Resides office in the morning for his appointment time. Strict return precautions given. Patient stable for discharge with close  outpatient follow-up.    Josephina Gip, PA-C 12/08/15 1932  Orlie Dakin, MD 12/08/15 2009

## 2015-12-08 NOTE — Discharge Instructions (Signed)
Call Dr. Zenia Resides office tomorrow. He will see you for an appointment in the morning. Take your lasix three times a day. Take the 3.125 mg coreg twice a day instead of the 6.25. Return to ED with new, worsening or concerning symptoms.   Shortness of Breath Shortness of breath means you have trouble breathing. It could also mean that you have a medical problem. You should get immediate medical care for shortness of breath. CAUSES   Not enough oxygen in the air such as with high altitudes or a smoke-filled room.  Certain lung diseases, infections, or problems.  Heart disease or conditions, such as angina or heart failure.  Low red blood cells (anemia).  Poor physical fitness, which can cause shortness of breath when you exercise.  Chest or back injuries or stiffness.  Being overweight.  Smoking.  Anxiety, which can make you feel like you are not getting enough air. DIAGNOSIS  Serious medical problems can often be found during your physical exam. Tests may also be done to determine why you are having shortness of breath. Tests may include:  Chest X-rays.  Lung function tests.  Blood tests.  An electrocardiogram (ECG).  An ambulatory electrocardiogram. An ambulatory ECG records your heartbeat patterns over a 24-hour period.  Exercise testing.  A transthoracic echocardiogram (TTE). During echocardiography, sound waves are used to evaluate how blood flows through your heart.  A transesophageal echocardiogram (TEE).  Imaging scans. Your health care provider may not be able to find a cause for your shortness of breath after your exam. In this case, it is important to have a follow-up exam with your health care provider as directed.  TREATMENT  Treatment for shortness of breath depends on the cause of your symptoms and can vary greatly. HOME CARE INSTRUCTIONS   Do not smoke. Smoking is a common cause of shortness of breath. If you smoke, ask for help to quit.  Avoid being  around chemicals or things that may bother your breathing, such as paint fumes and dust.  Rest as needed. Slowly resume your usual activities.  If medicines were prescribed, take them as directed for the full length of time directed. This includes oxygen and any inhaled medicines.  Keep all follow-up appointments as directed by your health care provider. SEEK MEDICAL CARE IF:   Your condition does not improve in the time expected.  You have a hard time doing your normal activities even with rest.  You have any new symptoms. SEEK IMMEDIATE MEDICAL CARE IF:   Your shortness of breath gets worse.  You feel light-headed, faint, or develop a cough not controlled with medicines.  You start coughing up blood.  You have pain with breathing.  You have chest pain or pain in your arms, shoulders, or abdomen.  You have a fever.  You are unable to walk up stairs or exercise the way you normally do. MAKE SURE YOU:  Understand these instructions.  Will watch your condition.  Will get help right away if you are not doing well or get worse.   This information is not intended to replace advice given to you by your health care provider. Make sure you discuss any questions you have with your health care provider.   Document Released: 03/29/2001 Document Revised: 07/09/2013 Document Reviewed: 09/19/2011 Elsevier Interactive Patient Education [employer].

## 2015-12-08 NOTE — ED Notes (Signed)
Ambulated in the hall without difficulty. O2 at 96% following ambulation.

## 2015-12-25 MED FILL — CARVEDILOL 6.25 MG TABLET: 6.25 | 30 days supply | Qty: 60 | Fill #2 | Status: TO

## 2015-12-25 MED FILL — ATORVASTATIN 80 MG TABLET: 80 | 30 days supply | Qty: 30 | Fill #4 | Status: TO

## 2015-12-25 MED FILL — CLOPIDOGREL 75 MG TABLET: 75 | 30 days supply | Qty: 30 | Fill #0 | Status: TO

## 2015-12-28 ENCOUNTER — Ambulatory Visit: Payer: Medicaid Other | Admitting: Family Medicine

## 2016-01-04 ENCOUNTER — Other Ambulatory Visit: Payer: Self-pay | Admitting: Pharmacist

## 2016-01-04 MED ORDER — ALLOPURINOL 300 MG PO TABS: 300.0000 mg | ORAL_TABLET | Freq: Every day | ORAL | Status: DC

## 2016-01-04 MED ORDER — FUROSEMIDE 40 MG PO TABS: 40.0000 mg | ORAL_TABLET | Freq: Three times a day (TID) | ORAL | Status: DC

## 2016-01-24 ENCOUNTER — Encounter (HOSPITAL_COMMUNITY): Payer: Self-pay | Admitting: *Deleted

## 2016-01-24 ENCOUNTER — Emergency Department (HOSPITAL_COMMUNITY)
Admission: EM | Admit: 2016-01-24 | Discharge: 2016-01-24 | Disposition: A | Discharge status: Home / Self Care | Payer: Medicaid Other | Attending: Emergency Medicine | Admitting: Emergency Medicine

## 2016-01-24 DIAGNOSIS — N390 Urinary tract infection, site not specified: Secondary | ICD-10-CM | POA: Diagnosis not present

## 2016-01-24 DIAGNOSIS — N185 Chronic kidney disease, stage 5: Secondary | ICD-10-CM | POA: Insufficient documentation

## 2016-01-24 DIAGNOSIS — Z7901 Long term (current) use of anticoagulants: Secondary | ICD-10-CM | POA: Diagnosis not present

## 2016-01-24 DIAGNOSIS — Z7982 Long term (current) use of aspirin: Secondary | ICD-10-CM | POA: Diagnosis not present

## 2016-01-24 DIAGNOSIS — Z79899 Other long term (current) drug therapy: Secondary | ICD-10-CM | POA: Diagnosis not present

## 2016-01-24 DIAGNOSIS — Z87891 Personal history of nicotine dependence: Secondary | ICD-10-CM | POA: Insufficient documentation

## 2016-01-24 DIAGNOSIS — I120 Hypertensive chronic kidney disease with stage 5 chronic kidney disease or end stage renal disease: Secondary | ICD-10-CM | POA: Diagnosis not present

## 2016-01-24 DIAGNOSIS — R531 Weakness: Secondary | ICD-10-CM | POA: Diagnosis not present

## 2016-01-24 DIAGNOSIS — I252 Old myocardial infarction: Secondary | ICD-10-CM | POA: Insufficient documentation

## 2016-01-24 LAB — BASIC METABOLIC PANEL
ANION GAP: 8 (ref 5–15)
BUN: 32 mg/dL — ABNORMAL HIGH (ref 6–20)
CO2: 20 mmol/L — AB (ref 22–32)
Calcium: 9.2 mg/dL (ref 8.9–10.3)
Chloride: 111 mmol/L (ref 101–111)
Creatinine, Ser: 2.12 mg/dL — ABNORMAL HIGH (ref 0.61–1.24)
GFR, EST AFRICAN AMERICAN: 37 mL/min — AB (ref >60)
GFR, EST NON AFRICAN AMERICAN: 32 mL/min — AB (ref >60)
GLUCOSE: 81 mg/dL (ref 65–99)
POTASSIUM: 3.6 mmol/L (ref 3.5–5.1)
Sodium: 139 mmol/L (ref 135–145)

## 2016-01-24 LAB — CBC
HEMATOCRIT: 42.9 % (ref 39.0–52.0)
HEMOGLOBIN: 14.3 g/dL (ref 13.0–17.0)
MCH: 34.5 pg — AB (ref 26.0–34.0)
MCHC: 33.3 g/dL (ref 30.0–36.0)
MCV: 103.4 fL — ABNORMAL HIGH (ref 78.0–100.0)
Platelets: 179 10*3/uL (ref 150–400)
RBC: 4.15 MIL/uL — AB (ref 4.22–5.81)
RDW: 13.4 % (ref 11.5–15.5)
WBC: 5 10*3/uL (ref 4.0–10.5)

## 2016-01-24 LAB — URINALYSIS, ROUTINE W REFLEX MICROSCOPIC
Bilirubin Urine: NEGATIVE
Glucose, UA: NEGATIVE mg/dL
Ketones, ur: NEGATIVE mg/dL
NITRITE: NEGATIVE
PH: 5 (ref 5.0–8.0)
Protein, ur: NEGATIVE mg/dL
SPECIFIC GRAVITY, URINE: 1.018 (ref 1.005–1.030)

## 2016-01-24 LAB — CBG MONITORING, ED: GLUCOSE-CAPILLARY: 70 mg/dL (ref 65–99)

## 2016-01-24 LAB — URINE MICROSCOPIC-ADD ON

## 2016-01-24 LAB — TROPONIN I

## 2016-01-24 MED ORDER — SULFAMETHOXAZOLE-TRIMETHOPRIM 800-160 MG PO TABS
1.0000 | ORAL_TABLET | Freq: Once | ORAL | Status: AC
  Administered 2016-01-24: 1 via ORAL
  Filled 2016-01-24: qty 1

## 2016-01-24 MED ORDER — SULFAMETHOXAZOLE-TRIMETHOPRIM 800-160 MG PO TABS: 1.0000 | ORAL_TABLET | Freq: Two times a day (BID) | ORAL | Status: AC

## 2016-01-24 MED ORDER — SODIUM CHLORIDE 0.9 % IV BOLUS (SEPSIS)
2000.0000 mL | Freq: Once | INTRAVENOUS | Status: AC
  Administered 2016-01-24: 2000 mL via INTRAVENOUS

## 2016-01-24 NOTE — Discharge Instructions (Signed)
Weakness We feel that your mildly dehydrated upon arrival here. Make sure that you drink at least six 8 ounce glasses of water daily. Take the antibiotic as prescribed as you were determined to have a urinary tract infection today. Please keep your scheduled appointment at the [facility] in 2 days. Tell office staff that you were seen here so that they can obtain your records. Weakness is a lack of strength. It may be felt all over the body (generalized) or in one specific part of the body (focal). Some causes of weakness can be serious. You may need further medical evaluation, especially if you are elderly or you have a history of immunosuppression (such as chemotherapy or HIV), kidney disease, heart disease, or diabetes. CAUSES  Weakness can be caused by many different things, including:  Infection.  Physical exhaustion.  Internal bleeding or other blood loss that results in a lack of red blood cells (anemia).  Dehydration. This cause is more common in elderly people.  Side effects or electrolyte abnormalities from medicines, such as pain medicines or sedatives.  Emotional distress, anxiety, or depression.  Circulation problems, especially severe peripheral arterial disease.  Heart disease, such as rapid atrial fibrillation, bradycardia, or heart failure.  Nervous system disorders, such as Guillain-Barr syndrome, multiple sclerosis, or stroke. DIAGNOSIS  To find the cause of your weakness, your caregiver will take your history and perform a physical exam. Lab tests or X-rays may also be ordered, if needed. TREATMENT  Treatment of weakness depends on the cause of your symptoms and can vary greatly. HOME CARE INSTRUCTIONS   Rest as needed.  Eat a well-balanced diet.  Try to get some exercise every day.  Only take over-the-counter or prescription medicines as directed by your caregiver. SEEK MEDICAL CARE IF:   Your weakness seems to be getting  worse or spreads to other parts of your body.  You develop new aches or pains. SEEK IMMEDIATE MEDICAL CARE IF:   You cannot perform your normal daily activities, such as getting dressed and feeding yourself.  You cannot walk up and down stairs, or you feel exhausted when you do so.  You have shortness of breath or chest pain.  You have difficulty moving parts of your body.  You have weakness in only one area of the body or on only one side of the body.  You have a fever.  You have trouble speaking or swallowing.  You cannot control your bladder or bowel movements.  You have black or bloody vomit or stools. MAKE SURE YOU:  Understand these instructions.  Will watch your condition.  Will get help right away if you are not doing well or get worse.   This information is not intended to replace advice given to you by your health care provider. Make sure you discuss any questions you have with your health care provider.   Document Released: 07/04/2005 Document Revised: 01/03/2012 Document Reviewed: 09/02/2011 Elsevier Interactive Patient Education [employer].

## 2016-01-24 NOTE — ED Notes (Signed)
Spoke with main lab, they will add on troponin.

## 2016-01-24 NOTE — ED Notes (Signed)
Pt arrives via EMS from home. Pt states that he hadnt eaten since before church this morning and had been sitting outside. States he tried to go get food down the street but was too dizzy and weak and called EMS. EMS reports initial BP 80/40. EMS gave 554ml NS and BP came up to 102/72. Pt states he no longer feels weak. Denies pain.

## 2016-01-24 NOTE — ED Notes (Signed)
Dinner tray ordered, heart healthy diet 

## 2016-01-24 NOTE — ED Provider Notes (Addendum)
CSN: [id number]     Arrival date & time 01/24/16  1656 History   First MD Initiated Contact with Patient 01/24/16 1704     Chief Complaint  Patient presents with  . Weakness     (Consider location/radiation/quality/duration/timing/severity/associated sxs/prior Treatment) HPI Complains of generalized weakness onset this morning upon awakening approximate 9 AM. While sitting in a heat. Patient did eat breakfast this morning however when he got to get lunch he felt generally weak. Worse with standing. He denies pain anywhere. Denies fever denies cough denies chest pain or abdominal pain no blood per rectum. No other associated symptoms. He feels better since arriving here. Past Medical History  Diagnosis Date  . Hypertension   . Homelessness   . Urinary hesitancy   . Hypercholesterolemia   . NSTEMI (non-ST elevated myocardial infarction) ([place]) 01/07/2015  . Heart attack ([place]) 07/2014  . Walking pneumonia 07/2014  . Headache     "probably weekly" (01/07/2015)  . Arthritis     "all over" (01/07/2015)  . Anxiety   . Depression   . Noncompliance   . Polysubstance abuse     etoh, cocaine  . Ischemic dilated cardiomyopathy   . CKD (chronic kidney disease), stage IV ([place])   . History of echocardiogram 12/2014    EF 20-25%   Past Surgical History  Procedure Laterality Date  . Left heart catheterization with coronary angiogram N/A 08/15/2014    Procedure: LEFT HEART CATHETERIZATION WITH CORONARY ANGIOGRAM;  Surgeon: Clent Demark, MD;  Location: [facility] CATH LAB;  Service: Cardiovascular;  Laterality: N/A;  . Cardiac catheterization     No family history on file. Social History  Substance Use Topics  . Smoking status: Former Smoker -- 1.50 packs/day for 30 years    Types: Cigarettes    Quit date: 02/19/2004  . Smokeless tobacco: Never Used  . Alcohol Use: No     Comment: social  Last used alcohol yesterday. Last used crack cocaine 6 months ago. No history of IV drug use. No other drug  use  Review of Systems  HENT: Negative.   Respiratory: Negative.   Cardiovascular: Negative.   Gastrointestinal: Negative.   Musculoskeletal: Negative.   Skin: Negative.   Neurological: Positive for weakness and light-headedness.  Psychiatric/Behavioral: Negative.   All other systems reviewed and are negative.     Allergies  Review of patient's allergies indicates no known allergies.  Home Medications   Prior to Admission medications   Medication Sig Start Date End Date Taking? Authorizing Provider  albuterol (PROAIR HFA) 108 (90 Base) MCG/ACT inhaler Inhale 2 puffs into the lungs every 6 (six) hours as needed for wheezing or shortness of breath.    Historical Provider, MD  allopurinol (ZYLOPRIM) 300 MG tablet Take 1 tablet (300 mg total) by mouth daily. Must have office visit for any refills 01/04/16   [name], MD  aspirin EC 81 MG tablet Take 81 mg by mouth daily.    Historical Provider, MD  atorvastatin (LIPITOR) 80 MG tablet Take 80 mg by mouth daily.    Historical Provider, MD  b complex vitamins tablet Take 1 tablet by mouth daily.    Historical Provider, MD  betamethasone valerate (VALISONE) 0.1 % cream Apply topically 2 (two) times daily. Patient taking differently: Apply 1 application topically 2 (two) times daily as needed (for skin).  09/10/15   Kinnie Feil, MD  carvedilol (COREG) 3.125 MG tablet Take 1 tablet (3.125 mg total) by mouth 2 (two) times daily  with a meal. Discontinue 6.25mg  03/24/15   [name], MD  carvedilol (COREG) 3.125 MG tablet Take 1 tablet (3.125 mg total) by mouth 2 (two) times daily with a meal. 12/08/15   Stevi Barrett, PA-C  carvedilol (COREG) 6.25 MG tablet Take 6.25 mg by mouth 2 (two) times daily with a meal.    Historical Provider, MD  clopidogrel (PLAVIX) 75 MG tablet Take 75 mg by mouth daily. 07/15/15   Historical Provider, MD  colchicine 0.6 MG tablet Take 1 tablet (0.6 mg total) by mouth 2 (two) times daily. Patient taking  differently: Take 0.6 mg by mouth 2 (two) times daily as needed (for gout flares).  08/02/15   Leo Grosser, MD  diphenhydrAMINE (BENADRYL) 25 MG tablet Take 1 tablet (25 mg total) by mouth every 6 (six) hours. Patient taking differently: Take 25 mg by mouth every 6 (six) hours as needed for itching.  06/24/15   Leonard Schwartz, MD  furosemide (LASIX) 40 MG tablet Take 2 tablets (80 mg total) by mouth 2 (two) times daily. Patient taking differently: Take 40 mg by mouth 2 (two) times daily.  07/22/15   [name], MD  furosemide (LASIX) 40 MG tablet Take 1 tablet (40 mg total) by mouth 3 (three) times daily. 01/04/16   [name], MD  Multiple Vitamins-Minerals (ONE-A-DAY 50 PLUS PO) Take 1 tablet by mouth daily.    Historical Provider, MD  nitroGLYCERIN (NITROSTAT) 0.4 MG SL tablet Place 1 tablet (0.4 mg total) under the tongue every 5 (five) minutes x 3 doses as needed for chest pain. 11/02/15   [name], MD  ranitidine (ZANTAC) 150 MG tablet TAKE 1 TABLET BY MOUTH 2 TIMES DAILY 11/23/15   [name], MD  tamsulosin (FLOMAX) 0.4 MG CAPS capsule Take 0.4 mg by mouth daily. Reported on 07/11/2015 07/07/15   Historical Provider, MD  tiZANidine (ZANAFLEX) 4 MG tablet TAKE 1 TABLET BY MOUTH EVERY 8 HOURS AS NEEDED FOR MUSCLE SPASMS. 11/23/15   [name], MD  traMADol (ULTRAM) 50 MG tablet Take 1 tablet (50 mg total) by mouth every 12 (twelve) hours as needed for moderate pain or severe pain. 08/25/15   [name], MD  TRAVEL SICKNESS 25 MG CHEW TAKE 1 TABLET BY MOUTH 3 TIMES DAILY AS NEEDED FOR DIZZINESS. 11/23/15   [name], MD   BP 95/76 mmHg  Pulse 86  Temp(Src) 97.5 F (36.4 C) (Oral)  Resp 23  Ht 5\' 8"  (1.727 m)  Wt 170 lb (77.111 kg)  BMI 25.85 kg/m2  SpO2 99% Physical Exam  Constitutional: He appears well-developed and well-nourished.  Mucous membranes dry  HENT:  Head: Normocephalic and atraumatic.  Eyes: Conjunctivae are normal. Pupils are equal, round, and reactive to light.   Neck: Neck supple. No tracheal deviation present. No thyromegaly present.  Cardiovascular: Normal rate and regular rhythm.   No murmur heard. Pulmonary/Chest: Effort normal and breath sounds normal.  Abdominal: Soft. Bowel sounds are normal. He exhibits no distension. There is no tenderness.  Genitourinary: Penis normal.  Musculoskeletal: Normal range of motion. He exhibits no edema or tenderness.  Neurological: He is alert. Coordination normal.  Skin: Skin is warm and dry. No rash noted.  Psychiatric: He has a normal mood and affect.  Nursing note and vitals reviewed.   ED Course  Procedures (including critical care time) Labs Review Labs Reviewed  BASIC METABOLIC PANEL  CBC  URINALYSIS, ROUTINE W REFLEX MICROSCOPIC (NOT AT [facility])  CBG MONITORING, ED  Imaging Review No results found. I have personally reviewed and evaluated these images and lab results as part of my medical decision-making.   EKG Interpretation None     8:20 PM. Patient is asymptomatic after treatment with intravenous fluids and oral Bactrim DS. He is alert and ambulatory. A full meal in the emergency department not lightheaded on standing Results for orders placed or performed during the hospital encounter of 0000000  Basic metabolic panel  Result Value Ref Range   Sodium 139 135 - 145 mmol/L   Potassium 3.6 3.5 - 5.1 mmol/L   Chloride 111 101 - 111 mmol/L   CO2 20 (L) 22 - 32 mmol/L   Glucose, Bld 81 65 - 99 mg/dL   BUN 32 (H) 6 - 20 mg/dL   Creatinine, Ser 2.12 (H) 0.61 - 1.24 mg/dL   Calcium 9.2 8.9 - 10.3 mg/dL   GFR calc non Af Amer 32 (L) >60 mL/min   GFR calc Af Amer 37 (L) >60 mL/min   Anion gap 8 5 - 15  CBC  Result Value Ref Range   WBC 5.0 4.0 - 10.5 K/uL   RBC 4.15 (L) 4.22 - 5.81 MIL/uL   Hemoglobin 14.3 13.0 - 17.0 g/dL   HCT 42.9 39.0 - 52.0 %   MCV 103.4 (H) 78.0 - 100.0 fL   MCH 34.5 (H) 26.0 - 34.0 pg   MCHC 33.3 30.0 - 36.0 g/dL   RDW 13.4 11.5 - 15.5 %   Platelets 179  150 - 400 K/uL  Urinalysis, Routine w reflex microscopic  Result Value Ref Range   Color, Urine YELLOW YELLOW   APPearance CLOUDY (A) CLEAR   Specific Gravity, Urine 1.018 1.005 - 1.030   pH 5.0 5.0 - 8.0   Glucose, UA NEGATIVE NEGATIVE mg/dL   Hgb urine dipstick TRACE (A) NEGATIVE   Bilirubin Urine NEGATIVE NEGATIVE   Ketones, ur NEGATIVE NEGATIVE mg/dL   Protein, ur NEGATIVE NEGATIVE mg/dL   Nitrite NEGATIVE NEGATIVE   Leukocytes, UA LARGE (A) NEGATIVE  Troponin I  Result Value Ref Range   Troponin I <0.03 <0.03 ng/mL  Urine microscopic-add on  Result Value Ref Range   Squamous Epithelial / LPF 0-5 (A) NONE SEEN   WBC, UA TOO NUMEROUS TO COUNT 0 - 5 WBC/hpf   RBC / HPF 0-5 0 - 5 RBC/hpf   Bacteria, UA FEW (A) NONE SEEN  CBG monitoring, ED  Result Value Ref Range   Glucose-Capillary 70 65 - 99 mg/dL   No results found.  MDM  Plan urine sent for culture sensitivity. He is encouraged to keep his scheduled appointment the [facility] in 2 days. Generalized weakness felt to be secondary to mild dehydration. Diagnoses #1 generalized weakness #2 urinary tract infection #3 renal insufficiency Final diagnoses:  None      This morning  Orlie Dakin, MD 01/24/16 2034  Orlie Dakin, MD 01/24/16 2034

## 2016-01-25 ENCOUNTER — Telehealth: Payer: Self-pay

## 2016-01-25 LAB — URINE CULTURE
CULTURE: NO GROWTH
SPECIAL REQUESTS: NORMAL

## 2016-01-25 NOTE — Telephone Encounter (Signed)
Call received from the patient inquiring about the time of his appointment this week. Informed him that his appointment with Dr Jarold Song is 01/27/16 @ 1045.  He was very appreciative of the information.

## 2016-01-27 ENCOUNTER — Ambulatory Visit: Payer: Medicaid Other | Attending: Family Medicine | Admitting: Family Medicine

## 2016-01-27 VITALS — BP 86/65 | HR 90 | Temp 97.5°F | Ht 68.0 in | Wt 175.2 lb

## 2016-01-27 DIAGNOSIS — M10472 Other secondary gout, left ankle and foot: Secondary | ICD-10-CM | POA: Diagnosis not present

## 2016-01-27 DIAGNOSIS — I5042 Chronic combined systolic (congestive) and diastolic (congestive) heart failure: Secondary | ICD-10-CM

## 2016-01-27 DIAGNOSIS — N183 Chronic kidney disease, stage 3 unspecified: Secondary | ICD-10-CM

## 2016-01-27 DIAGNOSIS — L309 Dermatitis, unspecified: Secondary | ICD-10-CM | POA: Insufficient documentation

## 2016-01-27 DIAGNOSIS — I10 Essential (primary) hypertension: Secondary | ICD-10-CM

## 2016-01-27 DIAGNOSIS — R42 Dizziness and giddiness: Secondary | ICD-10-CM

## 2016-01-27 MED ORDER — CARVEDILOL 3.125 MG PO TABS: 3.1250 mg | ORAL_TABLET | Freq: Two times a day (BID) | ORAL | Status: DC

## 2016-01-27 NOTE — Progress Notes (Signed)
Subjective:    Patient ID: [name], male    DOB: 1956-03-05, 60 y.o.   MRN: [id number]  HPI 60 year old male with a history of chronic systolic and diastolic CHF (EF 123XX123 from 2-D echo of 04/2015), gout, chronic kidney disease (stage 2-3) here for follow-up of visit.  He was last seen by cardiology last week and Entresto was added to his medication regimen and the dose of carvedilol was increased. He subsequently presented to the ED a few days later with lightheadedness and was found to be dehydrated and creatinine elevated to 2.12 from a baseline of 1.7; he was also treated for UTI and is currently on Bactrim.  He informs me of lightheadedness but no chest pains or shortness of breath and he has no pedal edema. Also complains of pruritus of his lower extremities and peeling of the skin of his hands; I had previously referred him to dermatology and he ended up in family medicine were he was prescribed betamethasone valerate which he said wasn't helping. He also missed a follow-up appointment there.  Past Medical History  Diagnosis Date  . Hypertension   . Homelessness   . Urinary hesitancy   . Hypercholesterolemia   . NSTEMI (non-ST elevated myocardial infarction) ([place]) 01/07/2015  . Heart attack ([place]) 07/2014  . Walking pneumonia 07/2014  . Headache     "probably weekly" (01/07/2015)  . Arthritis     "all over" (01/07/2015)  . Anxiety   . Depression   . Noncompliance   . Polysubstance abuse     etoh, cocaine  . Ischemic dilated cardiomyopathy   . CKD (chronic kidney disease), stage IV ([place])   . History of echocardiogram 12/2014    EF 20-25%    Past Surgical History  Procedure Laterality Date  . Left heart catheterization with coronary angiogram N/A 08/15/2014    Procedure: LEFT HEART CATHETERIZATION WITH CORONARY ANGIOGRAM;  Surgeon: Clent Demark, MD;  Location: [facility] CATH LAB;  Service: Cardiovascular;  Laterality: N/A;  . Cardiac catheterization      No Known  Allergies  Current Outpatient Prescriptions on File Prior to Visit  Medication Sig Dispense Refill  . allopurinol (ZYLOPRIM) 300 MG tablet Take 1 tablet (300 mg total) by mouth daily. Must have office visit for any refills 30 tablet 0  . aspirin EC 81 MG tablet Take 81 mg by mouth daily. Reported on 01/24/2016    . atorvastatin (LIPITOR) 80 MG tablet Take 80 mg by mouth daily.    Marland Kitchen b complex vitamins tablet Take 1 tablet by mouth daily.    . clopidogrel (PLAVIX) 75 MG tablet Take 75 mg by mouth daily.  4  . furosemide (LASIX) 40 MG tablet Take 2 tablets (80 mg total) by mouth 2 (two) times daily. (Patient taking differently: Take 40 mg by mouth 2 (two) times daily. ) 60 tablet 2  . Multiple Vitamins-Minerals (ONE-A-DAY 50 PLUS PO) Take 1 tablet by mouth daily.    . nitroGLYCERIN (NITROSTAT) 0.4 MG SL tablet Place 1 tablet (0.4 mg total) under the tongue every 5 (five) minutes x 3 doses as needed for chest pain. 25 tablet 0  . ranitidine (ZANTAC) 150 MG tablet TAKE 1 TABLET BY MOUTH 2 TIMES DAILY 60 tablet 2  . sulfamethoxazole-trimethoprim (BACTRIM DS,SEPTRA DS) 800-160 MG tablet Take 1 tablet by mouth 2 (two) times daily. 14 tablet 0  . tamsulosin (FLOMAX) 0.4 MG CAPS capsule Take 0.4 mg by mouth daily. Reported on 07/11/2015  11  .  betamethasone valerate (VALISONE) 0.1 % cream Apply topically 2 (two) times daily. (Patient not taking: Reported on 01/24/2016) 30 g 1  . colchicine 0.6 MG tablet Take 1 tablet (0.6 mg total) by mouth 2 (two) times daily. (Patient not taking: Reported on 01/27/2016) 10 tablet 0  . diphenhydrAMINE (BENADRYL) 25 MG tablet Take 1 tablet (25 mg total) by mouth every 6 (six) hours. (Patient not taking: Reported on 01/27/2016) 20 tablet 0  . tiZANidine (ZANAFLEX) 4 MG tablet TAKE 1 TABLET BY MOUTH EVERY 8 HOURS AS NEEDED FOR MUSCLE SPASMS. (Patient not taking: Reported on 01/27/2016) 60 tablet 0  . traMADol (ULTRAM) 50 MG tablet Take 1 tablet (50 mg total) by mouth every 12  (twelve) hours as needed for moderate pain or severe pain. (Patient not taking: Reported on 01/27/2016) 50 tablet 1  . TRAVEL SICKNESS 25 MG CHEW TAKE 1 TABLET BY MOUTH 3 TIMES DAILY AS NEEDED FOR DIZZINESS. (Patient not taking: Reported on 01/27/2016) 90 tablet 0   No current facility-administered medications on file prior to visit.    Review of Systems Constitutional: Negative for activity change and appetite change.  HENT: Negative for sinus pressure and sore throat.   Eyes: Negative for visual disturbance.  Respiratory: Negative for cough, chest tightness and shortness of breath.   Cardiovascular: Negative for chest pain and leg swelling.  Gastrointestinal: Negative for abdominal pain, diarrhea, constipation and abdominal distention.  Endocrine: Negative.   Genitourinary: Negative for dysuria.  Musculoskeletal: Negative for myalgias and joint swelling.  Skin: see hpi  Allergic/Immunologic: Negative.   Neurological: Positive for light-headedness. Negative for weakness and numbness.  Psychiatric/Behavioral: Negative for suicidal ideas and dysphoric mood.     Objective: Filed Vitals:   01/27/16 1041  BP: 86/65  Pulse: 90  Temp: 97.5 F (36.4 C)  TempSrc: Oral  Height: 5\' 8"  (1.727 m)  Weight: 175 lb 3.2 oz (79.47 kg)  SpO2: 98%      Physical Exam Constitutional: He is oriented to person, place, and time. He appears well-developed and well-nourished.  Cardiovascular: Normal rate, normal heart sounds and intact distal pulses.   No murmur heard. Pulmonary/Chest: Effort normal and breath sounds normal. He has no wheezes. He has no rales. He exhibits no tenderness.  Abdominal: Soft. Bowel sounds are normal. He exhibits no distension and no mass. There is no tenderness.  Musculoskeletal: Normal range of motion.  Neurological: He is alert and oriented to person, place, and time.  Skin: Excoriation and peeling of skin of the palm       Assessment & Plan:  Dizziness:    Previously diagnosed with Vertigo for which he was placed on meclizine but he no longer takes this Advised to change positions slowly. Stay hydrated but will need to limit fluid intake to 2 L due to CHF If symptoms persist I will place him back on meclizine  Hypotension: Blood pressure is still on the low side  Reduced dose of carvedilol from 6.25-3.125   Chronic kidney disease: Bump in creatinine which is now at 2.12 Avoid nephrotoxins Rise could be secondary to Lasix We'll send a basic metabolic panel today  Congestive heart failure: EF 25-30%. No evidence of fluid overload. Continue medications. Keep appointment with cardiology.  Dermatitis: Was referred to dermatology but he was sent to family practice clinic. He missed his appointment there and has been advised to reschedule appointment  Gout: No acute flare

## 2016-01-27 NOTE — Patient Instructions (Signed)
Hypertension Hypertension, commonly called high blood pressure, is when the force of blood pumping through your arteries is too strong. Your arteries are the blood vessels that carry blood from your heart throughout your body. A blood pressure reading consists of a higher number over a lower number, such as 110/72. The higher number (systolic) is the pressure inside your arteries when your heart pumps. The lower number (diastolic) is the pressure inside your arteries when your heart relaxes. Ideally you want your blood pressure below 120/80. Hypertension forces your heart to work harder to pump blood. Your arteries may become narrow or stiff. Having untreated or uncontrolled hypertension can cause heart attack, stroke, kidney disease, and other problems. RISK FACTORS Some risk factors for high blood pressure are controllable. Others are not.  Risk factors you cannot control include:   Race. You may be at higher risk if you are African American.  Age. Risk increases with age.  Gender. Men are at higher risk than women before age 45 years. After age 65, women are at higher risk than men. Risk factors you can control include:  Not getting enough exercise or physical activity.  Being overweight.  Getting too much fat, sugar, calories, or salt in your diet.  Drinking too much alcohol. SIGNS AND SYMPTOMS Hypertension does not usually cause signs or symptoms. Extremely high blood pressure (hypertensive crisis) may cause headache, anxiety, shortness of breath, and nosebleed. DIAGNOSIS To check if you have hypertension, your health care provider will measure your blood pressure while you are seated, with your arm held at the level of your heart. It should be measured at least twice using the same arm. Certain conditions can cause a difference in blood pressure between your right and left arms. A blood pressure reading that is higher than normal on one occasion does not mean that you need treatment. If  it is not clear whether you have high blood pressure, you may be asked to return on a different day to have your blood pressure checked again. Or, you may be asked to monitor your blood pressure at home for 1 or more weeks. TREATMENT Treating high blood pressure includes making lifestyle changes and possibly taking medicine. Living a healthy lifestyle can help lower high blood pressure. You may need to change some of your habits. Lifestyle changes may include:  Following the DASH diet. This diet is high in fruits, vegetables, and whole grains. It is low in salt, red meat, and added sugars.  Keep your sodium intake below 2,300 mg per day.  Getting at least 30-45 minutes of aerobic exercise at least 4 times per week.  Losing weight if necessary.  Not smoking.  Limiting alcoholic beverages.  Learning ways to reduce stress. Your health care provider may prescribe medicine if lifestyle changes are not enough to get your blood pressure under control, and if one of the following is true:  You are 18-59 years of age and your systolic blood pressure is above 140.  You are 60 years of age or older, and your systolic blood pressure is above 150.  Your diastolic blood pressure is above 90.  You have diabetes, and your systolic blood pressure is over 140 or your diastolic blood pressure is over 90.  You have kidney disease and your blood pressure is above 140/90.  You have heart disease and your blood pressure is above 140/90. Your personal target blood pressure may vary depending on your medical conditions, your age, and other factors. HOME CARE INSTRUCTIONS    Have your blood pressure rechecked as directed by your health care provider.   Take medicines only as directed by your health care provider. Follow the directions carefully. Blood pressure medicines must be taken as prescribed. The medicine does not work as well when you skip doses. Skipping doses also puts you at risk for  problems.  Do not smoke.   Monitor your blood pressure at home as directed by your health care provider. SEEK MEDICAL CARE IF:   You think you are having a reaction to medicines taken.  You have recurrent headaches or feel dizzy.  You have swelling in your ankles.  You have trouble with your vision. SEEK IMMEDIATE MEDICAL CARE IF:  You develop a severe headache or confusion.  You have unusual weakness, numbness, or feel faint.  You have severe chest or abdominal pain.  You vomit repeatedly.  You have trouble breathing. MAKE SURE YOU:   Understand these instructions.  Will watch your condition.  Will get help right away if you are not doing well or get worse.   This information is not intended to replace advice given to you by your health care provider. Make sure you discuss any questions you have with your health care provider.   Document Released: 07/04/2005 Document Revised: 11/18/2014 Document Reviewed: 04/26/2013 Elsevier Interactive Patient Education 2016 [employer]  

## 2016-01-28 ENCOUNTER — Encounter: Payer: Self-pay | Admitting: Family Medicine

## 2016-02-05 ENCOUNTER — Other Ambulatory Visit: Payer: Self-pay | Admitting: Pharmacist

## 2016-02-05 MED ORDER — ALLOPURINOL 300 MG PO TABS: 300.0000 mg | ORAL_TABLET | Freq: Every day | ORAL | Status: DC

## 2016-02-29 ENCOUNTER — Emergency Department (HOSPITAL_COMMUNITY): Payer: Medicaid Other

## 2016-02-29 ENCOUNTER — Emergency Department (HOSPITAL_COMMUNITY)
Admission: EM | Admit: 2016-02-29 | Discharge: 2016-02-29 | Disposition: A | Discharge status: Home / Self Care | Payer: Medicaid Other | Attending: Emergency Medicine | Admitting: Emergency Medicine

## 2016-02-29 ENCOUNTER — Encounter (HOSPITAL_COMMUNITY): Payer: Self-pay

## 2016-02-29 DIAGNOSIS — B349 Viral infection, unspecified: Secondary | ICD-10-CM | POA: Insufficient documentation

## 2016-02-29 DIAGNOSIS — R05 Cough: Secondary | ICD-10-CM

## 2016-02-29 DIAGNOSIS — R059 Cough, unspecified: Secondary | ICD-10-CM

## 2016-02-29 DIAGNOSIS — Z87891 Personal history of nicotine dependence: Secondary | ICD-10-CM | POA: Diagnosis not present

## 2016-02-29 DIAGNOSIS — I129 Hypertensive chronic kidney disease with stage 1 through stage 4 chronic kidney disease, or unspecified chronic kidney disease: Secondary | ICD-10-CM | POA: Diagnosis not present

## 2016-02-29 DIAGNOSIS — R1013 Epigastric pain: Secondary | ICD-10-CM

## 2016-02-29 DIAGNOSIS — Z79899 Other long term (current) drug therapy: Secondary | ICD-10-CM | POA: Diagnosis not present

## 2016-02-29 DIAGNOSIS — R112 Nausea with vomiting, unspecified: Secondary | ICD-10-CM | POA: Insufficient documentation

## 2016-02-29 DIAGNOSIS — N184 Chronic kidney disease, stage 4 (severe): Secondary | ICD-10-CM | POA: Insufficient documentation

## 2016-02-29 DIAGNOSIS — K5900 Constipation, unspecified: Secondary | ICD-10-CM | POA: Insufficient documentation

## 2016-02-29 LAB — CBC WITH DIFFERENTIAL/PLATELET
BASOS ABS: 0 10*3/uL (ref 0.0–0.1)
BASOS PCT: 0 %
EOS PCT: 2 %
Eosinophils Absolute: 0.1 10*3/uL (ref 0.0–0.7)
HEMATOCRIT: 37.3 % — AB (ref 39.0–52.0)
Hemoglobin: 12 g/dL — ABNORMAL LOW (ref 13.0–17.0)
Lymphocytes Relative: 26 %
Lymphs Abs: 1.3 10*3/uL (ref 0.7–4.0)
MCH: 34.2 pg — ABNORMAL HIGH (ref 26.0–34.0)
MCHC: 32.2 g/dL (ref 30.0–36.0)
MCV: 106.3 fL — ABNORMAL HIGH (ref 78.0–100.0)
MONO ABS: 0.6 10*3/uL (ref 0.1–1.0)
Monocytes Relative: 12 %
NEUTROS ABS: 3 10*3/uL (ref 1.7–7.7)
Neutrophils Relative %: 60 %
PLATELETS: 130 10*3/uL — AB (ref 150–400)
RBC: 3.51 MIL/uL — ABNORMAL LOW (ref 4.22–5.81)
RDW: 14.8 % (ref 11.5–15.5)
WBC: 5.1 10*3/uL (ref 4.0–10.5)

## 2016-02-29 LAB — COMPREHENSIVE METABOLIC PANEL
ALT: 21 U/L (ref 17–63)
AST: 28 U/L (ref 15–41)
Albumin: 3.9 g/dL (ref 3.5–5.0)
Alkaline Phosphatase: 78 U/L (ref 38–126)
Anion gap: 7 (ref 5–15)
BILIRUBIN TOTAL: 1.4 mg/dL — AB (ref 0.3–1.2)
BUN: 13 mg/dL (ref 6–20)
CHLORIDE: 112 mmol/L — AB (ref 101–111)
CO2: 23 mmol/L (ref 22–32)
Calcium: 9.3 mg/dL (ref 8.9–10.3)
Creatinine, Ser: 1.68 mg/dL — ABNORMAL HIGH (ref 0.61–1.24)
GFR, EST AFRICAN AMERICAN: 49 mL/min — AB (ref >60)
GFR, EST NON AFRICAN AMERICAN: 42 mL/min — AB (ref >60)
Glucose, Bld: 111 mg/dL — ABNORMAL HIGH (ref 65–99)
POTASSIUM: 4.3 mmol/L (ref 3.5–5.1)
Sodium: 142 mmol/L (ref 135–145)
Total Protein: 6.5 g/dL (ref 6.5–8.1)

## 2016-02-29 LAB — I-STAT TROPONIN, ED: Troponin i, poc: 0.03 ng/mL (ref 0.00–0.08)

## 2016-02-29 LAB — LIPASE, BLOOD: LIPASE: 14 U/L (ref 11–51)

## 2016-02-29 MED ORDER — ONDANSETRON 4 MG PO TBDP: 4.0000 mg | ORAL_TABLET | Freq: Three times a day (TID) | ORAL | 0 refills | Status: DC | PRN

## 2016-02-29 MED ORDER — POLYETHYLENE GLYCOL 3350 17 G PO PACK: 17.0000 g | PACK | Freq: Every day | ORAL | 0 refills | Status: DC | PRN

## 2016-02-29 MED ORDER — ONDANSETRON 4 MG PO TBDP
4.0000 mg | ORAL_TABLET | Freq: Once | ORAL | Status: AC
  Administered 2016-02-29: 4 mg via ORAL
  Filled 2016-02-29: qty 1

## 2016-02-29 MED ORDER — ACETAMINOPHEN 325 MG PO TABS
650.0000 mg | ORAL_TABLET | Freq: Once | ORAL | Status: AC
  Administered 2016-02-29: 650 mg via ORAL
  Filled 2016-02-29: qty 2

## 2016-02-29 MED ORDER — IPRATROPIUM-ALBUTEROL 0.5-2.5 (3) MG/3ML IN SOLN
3.0000 mL | Freq: Once | RESPIRATORY_TRACT | Status: AC
  Administered 2016-02-29: 3 mL via RESPIRATORY_TRACT
  Filled 2016-02-29: qty 3

## 2016-02-29 MED ORDER — ALBUTEROL SULFATE HFA 108 (90 BASE) MCG/ACT IN AERS: 1.0000 | INHALATION_SPRAY | Freq: Four times a day (QID) | RESPIRATORY_TRACT | 0 refills | Status: DC | PRN

## 2016-02-29 MED ORDER — BENZONATATE 100 MG PO CAPS: 100.0000 mg | ORAL_CAPSULE | Freq: Three times a day (TID) | ORAL | 0 refills | Status: DC | PRN

## 2016-02-29 NOTE — Discharge Instructions (Signed)
Take medication as prescribed as needed. Please follow up with your primary care provider as soon as possible. Return to the ER for new or worsening symptoms.

## 2016-02-29 NOTE — ED Provider Notes (Signed)
[facility] DEPT Provider Note   CSN: [id number] Arrival date & time: 02/29/16  [id number]  First Provider Contact:  First MD Initiated Contact with Patient 02/29/16 [id number]      History   Chief Complaint Chief Complaint  Patient presents with  . Generalized Body Aches  . Emesis   HPI  [name] is an 60 y.o. male with history of CAD, CKD, CHF, HTN, HLD, who presents to the ED for evaluation of multiple complaints. He states he was in his usual state of health until last night when he started noticing epigastric pain with associated nausea and vomiting. He states he has also had a new cough productive of white sputum with associated shortness of breath. Denies chest pain. He states he is coughing so much he is starting to have a "pressure headache." He states he feels generally weak and low energy. He states his abdominal pain is constant and at times will radiate to his sides and he feels like his "kidneys hurt." He reports both constipation as well as loose watery stools. He states he has had "many" episodes of NBNB emesis since last night. Denies dysuria, hematuria, increased urinary frequency/urgency. Denies fever or chills. Denies chest pain. Denies new leg pain or swelling. Denies recent medication changes. States he has been taking all of his home medications as prescribed. He has a history of polysubstance abuse but denies any recent EtOH or illicit drug use.  Past Medical History:  Diagnosis Date  . Anxiety   . Arthritis    "all over" (01/07/2015)  . CKD (chronic kidney disease), stage IV ([place])   . Depression   . Headache    "probably weekly" (01/07/2015)  . Heart attack ([place]) 07/2014  . History of echocardiogram 12/2014   EF 20-25%  . Homelessness   . Hypercholesterolemia   . Hypertension   . Ischemic dilated cardiomyopathy   . Noncompliance   . NSTEMI (non-ST elevated myocardial infarction) ([place]) 01/07/2015  . Polysubstance abuse    etoh, cocaine  . Urinary hesitancy   .  Walking pneumonia 07/2014   Past Surgical History:  Procedure Laterality Date  . CARDIAC CATHETERIZATION    . LEFT HEART CATHETERIZATION WITH CORONARY ANGIOGRAM N/A 08/15/2014   Procedure: LEFT HEART CATHETERIZATION WITH CORONARY ANGIOGRAM;  Surgeon: Clent Demark, MD;  Location: [facility] CATH LAB;  Service: Cardiovascular;  Laterality: N/A;    Home Medications    Prior to Admission medications   Medication Sig Start Date End Date Taking? Authorizing Provider  allopurinol (ZYLOPRIM) 300 MG tablet Take 1 tablet (300 mg total) by mouth daily. 02/05/16   [name], MD  atorvastatin (LIPITOR) 80 MG tablet Take 80 mg by mouth daily.    Historical Provider, MD  b complex vitamins tablet Take 1 tablet by mouth daily.    Historical Provider, MD  betamethasone valerate (VALISONE) 0.1 % cream Apply topically 2 (two) times daily. Patient not taking: Reported on 01/24/2016 09/10/15   Kinnie Feil, MD  carvedilol (COREG) 3.125 MG tablet Take 1 tablet (3.125 mg total) by mouth 2 (two) times daily with a meal. Discontinue 6.25mg  01/27/16   [name], MD  clopidogrel (PLAVIX) 75 MG tablet Take 75 mg by mouth daily. 07/15/15   Historical Provider, MD  colchicine 0.6 MG tablet Take 1 tablet (0.6 mg total) by mouth 2 (two) times daily. Patient not taking: Reported on 01/27/2016 08/02/15   Leo Grosser, MD  diphenhydrAMINE (BENADRYL) 25 MG tablet Take 1 tablet (25 mg total)  by mouth every 6 (six) hours. Patient not taking: Reported on 01/27/2016 06/24/15   Leonard Schwartz, MD  furosemide (LASIX) 40 MG tablet Take 2 tablets (80 mg total) by mouth 2 (two) times daily. Patient taking differently: Take 40 mg by mouth 2 (two) times daily.  07/22/15   [name], MD  Multiple Vitamins-Minerals (ONE-A-DAY 50 PLUS PO) Take 1 tablet by mouth daily.    Historical Provider, MD  nitroGLYCERIN (NITROSTAT) 0.4 MG SL tablet Place 1 tablet (0.4 mg total) under the tongue every 5 (five) minutes x 3 doses as needed for chest pain.  11/02/15   [name], MD  ranitidine (ZANTAC) 150 MG tablet TAKE 1 TABLET BY MOUTH 2 TIMES DAILY 11/23/15   [name], MD  sacubitril-valsartan (ENTRESTO) 24-26 MG Take 1 tablet by mouth 2 (two) times daily.    Historical Provider, MD  tamsulosin (FLOMAX) 0.4 MG CAPS capsule Take 0.4 mg by mouth daily. Reported on 07/11/2015 07/07/15   Historical Provider, MD  tiZANidine (ZANAFLEX) 4 MG tablet TAKE 1 TABLET BY MOUTH EVERY 8 HOURS AS NEEDED FOR MUSCLE SPASMS. Patient not taking: Reported on 01/27/2016 11/23/15   [name], MD  traMADol (ULTRAM) 50 MG tablet Take 1 tablet (50 mg total) by mouth every 12 (twelve) hours as needed for moderate pain or severe pain. Patient not taking: Reported on 01/27/2016 08/25/15   [name], MD  TRAVEL SICKNESS 25 MG CHEW TAKE 1 TABLET BY MOUTH 3 TIMES DAILY AS NEEDED FOR DIZZINESS. Patient not taking: Reported on 01/27/2016 11/23/15   [name], MD   Family History History reviewed. No pertinent family history.  Social History Social History  Substance Use Topics  . Smoking status: Former Smoker    Packs/day: 1.50    Years: 30.00    Types: Cigarettes    Quit date: 02/19/2004  . Smokeless tobacco: Never Used  . Alcohol use No     Comment: social    Allergies   Review of patient's allergies indicates no known allergies.  Review of Systems Review of Systems 10 Systems reviewed and are negative for acute change except as noted in the HPI.   Physical Exam Updated Vital Signs BP 126/88 (BP Location: Right Arm)   Pulse 93   Temp 98.2 F (36.8 C) (Oral)   Resp 16   Ht 5\' 8"  (1.727 m)   Wt 80.7 kg   SpO2 98%   BMI 27.06 kg/m   Physical Exam  Constitutional: He is oriented to person, place, and time.  HENT:  Right Ear: External ear normal.  Left Ear: External ear normal.  Nose: Nose normal.  Mouth/Throat: Oropharynx is clear and moist. No oropharyngeal exudate.  Eyes: Conjunctivae are normal.  Neck: Neck supple.  Cardiovascular: Normal  rate, regular rhythm, normal heart sounds and intact distal pulses.   Pulmonary/Chest: Effort normal. No respiratory distress.  Bilateral soft end-expiratory wheezing throughout. Some diminished lung sounds at bilateral bases.  Abdominal: Soft. Bowel sounds are normal. He exhibits no distension. There is no rebound and no guarding.  +RUQ and epigastric tenderness without rebound or guarding No CVA tenderness  Musculoskeletal: He exhibits no edema.  Lymphadenopathy:    He has no cervical adenopathy.  Neurological: He is alert and oriented to person, place, and time. No cranial nerve deficit.  Skin: Skin is warm and dry.  Psychiatric: He has a normal mood and affect.  Nursing note and vitals reviewed.    ED Treatments / Results  Labs (all labs  ordered are listed, but only abnormal results are displayed) Labs Reviewed  COMPREHENSIVE METABOLIC PANEL - Abnormal; Notable for the following:       Result Value   Chloride 112 (*)    Glucose, Bld 111 (*)    Creatinine, Ser 1.68 (*)    Total Bilirubin 1.4 (*)    GFR calc non Af Amer 42 (*)    GFR calc Af Amer 49 (*)    All other components within normal limits  CBC WITH DIFFERENTIAL/PLATELET - Abnormal; Notable for the following:    RBC 3.51 (*)    Hemoglobin 12.0 (*)    HCT 37.3 (*)    MCV 106.3 (*)    MCH 34.2 (*)    Platelets 130 (*)    All other components within normal limits  LIPASE, BLOOD  I-STAT TROPOININ, ED    EKG  EKG Interpretation  Date/Time:  Monday February 29 2016 10:05:19 EDT Ventricular Rate:  88 PR Interval:    QRS Duration: 98 QT Interval:  446 QTC Calculation: 540 R Axis:   -92 Text Interpretation:  Sinus rhythm Probable left atrial enlargement Left axis deviation Prolonged QT interval Baseline wander in lead(s) V3 No significant change since last tracing Abnormal ekg Confirmed by Carmin Muskrat  MD ([id number]) on 02/29/2016 10:11:12 AM       Radiology Dg Abdomen Acute W/chest  Result Date:  02/29/2016 CLINICAL DATA:  Abdominal pain and shortness of breath EXAM: DG ABDOMEN ACUTE W/ 1V CHEST COMPARISON:  12/08/2015 FINDINGS: Cardiac shadow is mildly enlarged. The lungs are clear bilaterally. No acute bony abnormality is seen. Scattered large small bowel gas is noted. No abnormal mass or abnormal calcifications are noted. The osseous structures show mild degenerative change of the lumbar spine. No free air is noted. IMPRESSION: No acute abnormality noted. Electronically Signed   By: Inez Catalina M.D.   On: 02/29/2016 09:29    Procedures Procedures (including critical care time)  Medications Ordered in ED Medications  ipratropium-albuterol (DUONEB) 0.5-2.5 (3) MG/3ML nebulizer solution 3 mL (3 mLs Nebulization Given 02/29/16 1003)  acetaminophen (TYLENOL) tablet 650 mg (650 mg Oral Given 02/29/16 1002)  ondansetron (ZOFRAN-ODT) disintegrating tablet 4 mg (4 mg Oral Given 02/29/16 1002)     Initial Impression / Assessment and Plan / ED Course  I have reviewed the triage vital signs and the nursing notes.  Pertinent labs & imaging results that were available during my care of the patient were reviewed by me and considered in my medical decision making (see chart for details).  Clinical Course    Pt feels improved after duoneb and anti-emetics. He has had no further emesis in the ED. On repeat exam his lung sounds are improved, abd exam benign. His labs are unrevealing. His CXR and abdominal XR are negative. EKG is abnormal but stable from prior and his troponin is negative. Given constellation of upper respiratory and GI symptoms I do suspect a viral etiology. Doubt emergent etiology requiring further workup at this time. Pt is nontoxic appearing and hemodynamically stable. Will plan to d/c home with supportive meds. Encouraged close PCP f/u. ER return precautions given.  Final Clinical Impressions(s) / ED Diagnoses   Final diagnoses:  Viral syndrome  Cough  Epigastric pain   Non-intractable vomiting with nausea, vomiting of unspecified type  Constipation, unspecified constipation type    New Prescriptions New Prescriptions   ALBUTEROL (PROVENTIL HFA;VENTOLIN HFA) 108 (90 BASE) MCG/ACT INHALER    Inhale 1-2 puffs into the lungs  every 6 (six) hours as needed for wheezing or shortness of breath.   BENZONATATE (TESSALON) 100 MG CAPSULE    Take 1 capsule (100 mg total) by mouth 3 (three) times daily as needed for cough.   ONDANSETRON (ZOFRAN ODT) 4 MG DISINTEGRATING TABLET    Take 1 tablet (4 mg total) by mouth every 8 (eight) hours as needed for nausea or vomiting.   POLYETHYLENE GLYCOL (MIRALAX) PACKET    Take 17 g by mouth daily as needed (constipation).     Anne Ng, PA-C 02/29/16 1026    Carmin Muskrat, MD 03/02/16 1630

## 2016-02-29 NOTE — ED Triage Notes (Signed)
[facility]- pt coming from home with c/o body aches, generalized weakness, and vomiting that started last night. Pt also reports difficulty catching his breath. Resp e/u on arrival. Vitals stable with EMS.

## 2016-03-23 ENCOUNTER — Emergency Department (HOSPITAL_COMMUNITY)
Admission: EM | Admit: 2016-03-23 | Discharge: 2016-03-23 | Disposition: A | Discharge status: Home / Self Care | Payer: Medicaid Other | Attending: Emergency Medicine | Admitting: Emergency Medicine

## 2016-03-23 ENCOUNTER — Encounter (HOSPITAL_COMMUNITY): Payer: Self-pay | Admitting: Emergency Medicine

## 2016-03-23 ENCOUNTER — Emergency Department (HOSPITAL_COMMUNITY): Payer: Medicaid Other

## 2016-03-23 DIAGNOSIS — I130 Hypertensive heart and chronic kidney disease with heart failure and stage 1 through stage 4 chronic kidney disease, or unspecified chronic kidney disease: Secondary | ICD-10-CM | POA: Insufficient documentation

## 2016-03-23 DIAGNOSIS — Z87891 Personal history of nicotine dependence: Secondary | ICD-10-CM | POA: Insufficient documentation

## 2016-03-23 DIAGNOSIS — J069 Acute upper respiratory infection, unspecified: Secondary | ICD-10-CM | POA: Insufficient documentation

## 2016-03-23 DIAGNOSIS — N184 Chronic kidney disease, stage 4 (severe): Secondary | ICD-10-CM | POA: Insufficient documentation

## 2016-03-23 DIAGNOSIS — I252 Old myocardial infarction: Secondary | ICD-10-CM | POA: Insufficient documentation

## 2016-03-23 DIAGNOSIS — I5042 Chronic combined systolic (congestive) and diastolic (congestive) heart failure: Secondary | ICD-10-CM | POA: Insufficient documentation

## 2016-03-23 DIAGNOSIS — R0789 Other chest pain: Secondary | ICD-10-CM | POA: Diagnosis present

## 2016-03-23 LAB — CBC WITH DIFFERENTIAL/PLATELET
Basophils Absolute: 0 10*3/uL (ref 0.0–0.1)
Basophils Relative: 1 %
Eosinophils Absolute: 0.2 10*3/uL (ref 0.0–0.7)
Eosinophils Relative: 4 %
HEMATOCRIT: 39.3 % (ref 39.0–52.0)
Hemoglobin: 12.5 g/dL — ABNORMAL LOW (ref 13.0–17.0)
LYMPHS ABS: 1.4 10*3/uL (ref 0.7–4.0)
Lymphocytes Relative: 22 %
MCH: 34.2 pg — AB (ref 26.0–34.0)
MCHC: 31.8 g/dL (ref 30.0–36.0)
MCV: 107.4 fL — AB (ref 78.0–100.0)
MONO ABS: 0.8 10*3/uL (ref 0.1–1.0)
MONOS PCT: 12 %
NEUTROS ABS: 4 10*3/uL (ref 1.7–7.7)
NEUTROS PCT: 61 %
Platelets: 154 10*3/uL (ref 150–400)
RBC: 3.66 MIL/uL — ABNORMAL LOW (ref 4.22–5.81)
RDW: 14.8 % (ref 11.5–15.5)
WBC: 6.4 10*3/uL (ref 4.0–10.5)

## 2016-03-23 LAB — BASIC METABOLIC PANEL
Anion gap: 14 (ref 5–15)
BUN: 14 mg/dL (ref 6–20)
CO2: 23 mmol/L (ref 22–32)
CREATININE: 1.84 mg/dL — AB (ref 0.61–1.24)
Calcium: 8.8 mg/dL — ABNORMAL LOW (ref 8.9–10.3)
Chloride: 102 mmol/L (ref 101–111)
GFR calc Af Amer: 44 mL/min — ABNORMAL LOW (ref >60)
GFR calc non Af Amer: 38 mL/min — ABNORMAL LOW (ref >60)
GLUCOSE: 108 mg/dL — AB (ref 65–99)
Potassium: 4.1 mmol/L (ref 3.5–5.1)
Sodium: 139 mmol/L (ref 135–145)

## 2016-03-23 LAB — I-STAT TROPONIN, ED: Troponin i, poc: 0.01 ng/mL (ref 0.00–0.08)

## 2016-03-23 MED ORDER — ALBUTEROL (5 MG/ML) CONTINUOUS INHALATION SOLN
10.0000 mg/h | INHALATION_SOLUTION | RESPIRATORY_TRACT | Status: DC
  Administered 2016-03-23: 10 mg/h via RESPIRATORY_TRACT
  Filled 2016-03-23: qty 20

## 2016-03-23 MED ORDER — PREDNISONE 20 MG PO TABS: 40.0000 mg | ORAL_TABLET | Freq: Every day | ORAL | 0 refills | Status: DC

## 2016-03-23 MED ORDER — OXYMETAZOLINE HCL 0.05 % NA SOLN: 1.0000 | Freq: Two times a day (BID) | NASAL | 0 refills | Status: DC

## 2016-03-23 MED ORDER — ALBUTEROL SULFATE HFA 108 (90 BASE) MCG/ACT IN AERS
2.0000 | INHALATION_SPRAY | Freq: Once | RESPIRATORY_TRACT | Status: AC
Start: 2016-03-23 — End: 2016-03-23
  Administered 2016-03-23: 2 via RESPIRATORY_TRACT
  Filled 2016-03-23: qty 6.7

## 2016-03-23 MED ORDER — BENZONATATE 100 MG PO CAPS: 200.0000 mg | ORAL_CAPSULE | Freq: Two times a day (BID) | ORAL | 0 refills | Status: DC | PRN

## 2016-03-23 MED ORDER — IPRATROPIUM-ALBUTEROL 0.5-2.5 (3) MG/3ML IN SOLN
3.0000 mL | Freq: Once | RESPIRATORY_TRACT | Status: AC
  Administered 2016-03-23: 3 mL via RESPIRATORY_TRACT
  Filled 2016-03-23: qty 3

## 2016-03-23 MED ORDER — PREDNISONE 20 MG PO TABS
60.0000 mg | ORAL_TABLET | Freq: Once | ORAL | Status: AC
  Administered 2016-03-23: 60 mg via ORAL
  Filled 2016-03-23: qty 3

## 2016-03-23 NOTE — ED Notes (Signed)
Nicole PA at bedside   

## 2016-03-23 NOTE — Discharge Instructions (Signed)
Take your medications as prescribed. Continue drinking fluids at home to remain hydrated. Follow up with your primary care provider in the next week for follow-up. Please return to the Emergency Department if symptoms worsen or new onset of fever, chest pain, difficulty breathing, coughing up blood, abdominal pain, vomiting, unable to keep fluids down.

## 2016-03-23 NOTE — ED Notes (Signed)
Nicole, PA at bedside at this time. 

## 2016-03-23 NOTE — ED Notes (Signed)
Ambulated Pt, tolerated well. Pt o2 ranged from 96% to 99%

## 2016-03-23 NOTE — ED Triage Notes (Signed)
Patient arrives to ED via [facility]. EMS reports: patient from home. Reports productive cough x 3 days w/white/yellowish phlegm. Also, reports chest pain x 3 days reported as tightness. Increases with cough and deep inspiration. Reports nausea w/one episode of emesis yesterday. EMS reports rhonchi in bases. VSS. BP 120/84, Pulse 96, Pulse ox 100% on room air.

## 2016-03-23 NOTE — ED Provider Notes (Signed)
[facility] DEPT Provider Note   CSN: [id number] Arrival date & time: 03/23/16  0515     History   Chief Complaint Chief Complaint  Patient presents with  . Cough  . Chest Pain  . Nausea    HPI [name] is a 60 y.o. male.  Patient is a 61 year old male with past medical history hypertension, hyperlipidemia, MI (most recent cath on 07/2014 showed EF 20-25%) and CKD who presents to the ED with complaint of chest pain, onset 3 days. Patient reports having midsternal chest tightness for the past 3 days which he states is worse with coughing or taking a deep breath. Endorses associated productive cough (white/yellow sputum), shortness of breath, wheezing, nasal congestion. Patient also reports having intermittent nausea and a few episodes of NBNB vomiting over the past 2 days. Patient denies taking any new medications at home for his symptoms but states he has been taking his home medications as prescribed. Denies fever, chills, headache, lightheadedness, dizziness, diaphoresis, hemoptysis, palpitations, abdominal pain, hematemesis, diarrhea, urinary symptoms, blood in urine or stool, leg swelling, weakness.  PCP- Dr. Jarold Song Cardiologist- Dr. Terrence Dupont      Past Medical History:  Diagnosis Date  . Anxiety   . Arthritis    "all over" (01/07/2015)  . CKD (chronic kidney disease), stage IV ([place])   . Depression   . Headache    "probably weekly" (01/07/2015)  . Heart attack ([place]) 07/2014  . History of echocardiogram 12/2014   EF 20-25%  . Homelessness   . Hypercholesterolemia   . Hypertension   . Ischemic dilated cardiomyopathy   . Noncompliance   . NSTEMI (non-ST elevated myocardial infarction) ([place]) 01/07/2015  . Polysubstance abuse    etoh, cocaine  . Urinary hesitancy   . Walking pneumonia 07/2014    Patient Active Problem List   Diagnosis Date Noted  . Dermatitis 01/27/2016  . Arthralgia 08/25/2015  . Pressure ulcer 05/22/2015  . AKI (acute kidney injury) ([place])   .  Transaminitis   . Septic shock ([place])   . Multiorgan failure   . Gram-negative bacteremia ([place])   . Infection caused by Enterobacter cloacae   . E coli infection   . Eosinophilia   . Hyperbilirubinemia   . Endotracheal tube present   . Acute respiratory failure ([place])   . Renal failure (ARF), acute on chronic (HCC)   . Sepsis ([place])   . Dyspnea   . FUO (fever of unknown origin)   . Elevated LFTs   . Submandibular gland infection   . Urticaria   . Rash and nonspecific skin eruption   . Dry mouth   . Dysphagia 05/05/2015  . Sepsis due to cellulitis ([place]) 05/04/2015  . Cellulitis of neck 05/04/2015  . Neck swelling 05/03/2015  . Chronic kidney disease 05/03/2015  . Chronic combined systolic (congestive) and diastolic (congestive) heart failure ([place]) 05/03/2015  . Benign essential HTN 05/03/2015  . History of non-ST elevation myocardial infarction (NSTEMI) 05/03/2015  . Hyponatremia 05/03/2015  . Abnormal LFTs 05/03/2015  . Leukocytosis 05/03/2015  . Polysubstance abuse   . Noncompliance   . Gout 03/24/2015    Past Surgical History:  Procedure Laterality Date  . CARDIAC CATHETERIZATION    . LEFT HEART CATHETERIZATION WITH CORONARY ANGIOGRAM N/A 08/15/2014   Procedure: LEFT HEART CATHETERIZATION WITH CORONARY ANGIOGRAM;  Surgeon: Clent Demark, MD;  Location: [facility] CATH LAB;  Service: Cardiovascular;  Laterality: N/A;       Home Medications    Prior to Admission  medications   Medication Sig Start Date End Date Taking? Authorizing Provider  albuterol (PROVENTIL HFA;VENTOLIN HFA) 108 (90 Base) MCG/ACT inhaler Inhale 1-2 puffs into the lungs every 6 (six) hours as needed for wheezing or shortness of breath. 02/29/16   Olivia Canter Sam, PA-C  allopurinol (ZYLOPRIM) 300 MG tablet Take 1 tablet (300 mg total) by mouth daily. 02/05/16   [name], MD  atorvastatin (LIPITOR) 80 MG tablet Take 80 mg by mouth daily.    Historical Provider, MD  b complex vitamins tablet Take 1 tablet by  mouth daily.    Historical Provider, MD  benzonatate (TESSALON) 100 MG capsule Take 2 capsules (200 mg total) by mouth 2 (two) times daily as needed for cough. 03/23/16   Nona Dell, PA-C  betamethasone valerate (VALISONE) 0.1 % cream Apply topically 2 (two) times daily. Patient not taking: Reported on 01/24/2016 09/10/15   Kinnie Feil, MD  carvedilol (COREG) 3.125 MG tablet Take 1 tablet (3.125 mg total) by mouth 2 (two) times daily with a meal. Discontinue 6.25mg  01/27/16   [name], MD  clopidogrel (PLAVIX) 75 MG tablet Take 75 mg by mouth daily. 07/15/15   Historical Provider, MD  colchicine 0.6 MG tablet Take 1 tablet (0.6 mg total) by mouth 2 (two) times daily. Patient not taking: Reported on 01/27/2016 08/02/15   Leo Grosser, MD  diphenhydrAMINE (BENADRYL) 25 MG tablet Take 1 tablet (25 mg total) by mouth every 6 (six) hours. Patient taking differently: Take 25 mg by mouth every 6 (six) hours as needed for itching or allergies.  06/24/15   Leonard Schwartz, MD  furosemide (LASIX) 40 MG tablet Take 2 tablets (80 mg total) by mouth 2 (two) times daily. Patient taking differently: Take 40 mg by mouth 2 (two) times daily.  07/22/15   [name], MD  Multiple Vitamins-Minerals (ONE-A-DAY 50 PLUS PO) Take 1 tablet by mouth daily.    Historical Provider, MD  nitroGLYCERIN (NITROSTAT) 0.4 MG SL tablet Place 1 tablet (0.4 mg total) under the tongue every 5 (five) minutes x 3 doses as needed for chest pain. 11/02/15   [name], MD  ondansetron (ZOFRAN ODT) 4 MG disintegrating tablet Take 1 tablet (4 mg total) by mouth every 8 (eight) hours as needed for nausea or vomiting. 02/29/16   Olivia Canter Sam, PA-C  oxymetazoline (AFRIN NASAL SPRAY) 0.05 % nasal spray Place 1 spray into both nostrils 2 (two) times daily. 03/23/16   Nona Dell, PA-C  polyethylene glycol [facility]) packet Take 17 g by mouth daily as needed (constipation). 02/29/16   Olivia Canter Sam, PA-C  predniSONE (DELTASONE) 20  MG tablet Take 2 tablets (40 mg total) by mouth daily. 03/23/16   Nona Dell, PA-C  ramipril (ALTACE) 1.25 MG capsule Take 1.25 mg by mouth daily.    Historical Provider, MD  ranitidine (ZANTAC) 150 MG tablet TAKE 1 TABLET BY MOUTH 2 TIMES DAILY 11/23/15   [name], MD  sacubitril-valsartan (ENTRESTO) 24-26 MG Take 1 tablet by mouth 2 (two) times daily.    Historical Provider, MD  tamsulosin (FLOMAX) 0.4 MG CAPS capsule Take 0.4 mg by mouth daily. Reported on 07/11/2015 07/07/15   Historical Provider, MD  tiZANidine (ZANAFLEX) 4 MG tablet TAKE 1 TABLET BY MOUTH EVERY 8 HOURS AS NEEDED FOR MUSCLE SPASMS. Patient not taking: Reported on 01/27/2016 11/23/15   [name], MD  traMADol (ULTRAM) 50 MG tablet Take 1 tablet (50 mg total) by mouth every 12 (twelve) hours  as needed for moderate pain or severe pain. Patient not taking: Reported on 01/27/2016 08/25/15   [name], MD  TRAVEL SICKNESS 25 MG CHEW TAKE 1 TABLET BY MOUTH 3 TIMES DAILY AS NEEDED FOR DIZZINESS. Patient not taking: Reported on 01/27/2016 11/23/15   [name], MD    Family History No family history on file.  Social History Social History  Substance Use Topics  . Smoking status: Former Smoker    Packs/day: 1.50    Years: 30.00    Types: Cigarettes    Quit date: 02/19/2004  . Smokeless tobacco: Never Used  . Alcohol use No     Comment: social     Allergies   Review of patient's allergies indicates no known allergies.   Review of Systems Review of Systems  HENT: Positive for congestion.   Respiratory: Positive for cough, shortness of breath and wheezing.   Cardiovascular: Positive for chest pain (tightness).  Gastrointestinal: Positive for nausea and vomiting.  All other systems reviewed and are negative.    Physical Exam Updated Vital Signs BP 140/96   Pulse 97   Temp 98.8 F (37.1 C) (Oral)   Resp 22   Ht 5\' 8"  (1.727 m)   Wt 80.7 kg   SpO2 99%   BMI 27.06 kg/m   Physical Exam    Constitutional: He is oriented to person, place, and time. He appears well-developed and well-nourished. No distress.  HENT:  Head: Normocephalic and atraumatic.  Mouth/Throat: Oropharynx is clear and moist. No oropharyngeal exudate.  Eyes: Conjunctivae and EOM are normal. Pupils are equal, round, and reactive to light. Right eye exhibits no discharge. Left eye exhibits no discharge. No scleral icterus.  Neck: Normal range of motion. Neck supple.  Cardiovascular: Normal rate, regular rhythm, normal heart sounds and intact distal pulses.   Pulmonary/Chest: Effort normal. No respiratory distress. He has wheezes. He has no rales. He exhibits no tenderness.  Mild expiratory wheezes noted in bilateral lower lobes  Abdominal: Soft. Bowel sounds are normal. He exhibits no distension and no mass. There is no tenderness. There is no rebound and no guarding. No hernia.  Musculoskeletal: Normal range of motion. He exhibits no edema.  Lymphadenopathy:    He has no cervical adenopathy.  Neurological: He is alert and oriented to person, place, and time.  Skin: Skin is warm and dry. He is not diaphoretic.  Nursing note and vitals reviewed.    ED Treatments / Results  Labs (all labs ordered are listed, but only abnormal results are displayed) Labs Reviewed  CBC WITH DIFFERENTIAL/PLATELET - Abnormal; Notable for the following:       Result Value   RBC 3.66 (*)    Hemoglobin 12.5 (*)    MCV 107.4 (*)    MCH 34.2 (*)    All other components within normal limits  BASIC METABOLIC PANEL - Abnormal; Notable for the following:    Glucose, Bld 108 (*)    Creatinine, Ser 1.84 (*)    Calcium 8.8 (*)    GFR calc non Af Amer 38 (*)    GFR calc Af Amer 44 (*)    All other components within normal limits  I-STAT TROPOININ, ED    EKG  EKG Interpretation  Date/Time:  Wednesday March 23 2016 05:23:53 EDT Ventricular Rate:  98 PR Interval:    QRS Duration: 187 QT Interval:  406 QTC  Calculation: 519 R Axis:   155 Text Interpretation:  Sinus rhythm Probable left atrial enlargement Right  bundle branch block No significant change since last tracing Confirmed by WARD,  DO, KRISTEN [phone]) on 03/23/2016 5:27:14 AM       Radiology Dg Chest 2 View  Result Date: 03/23/2016 CLINICAL DATA:  Cough and shortness of breath for 3 days EXAM: CHEST  2 VIEW COMPARISON:  Chest radiograph 02/29/2016 FINDINGS: Cardiac silhouette remains enlarged. No pneumothorax or sizable pleural effusion. No focal airspace consolidation or pulmonary edema. IMPRESSION: Cardiomegaly.  Clear lungs. Electronically Signed   By: Ulyses Jarred M.D.   On: 03/23/2016 05:50    Procedures Procedures (including critical care time)  Medications Ordered in ED Medications  albuterol (PROVENTIL,VENTOLIN) solution continuous neb (0 mg/hr Nebulization Stopped 03/23/16 0848)  ipratropium-albuterol (DUONEB) 0.5-2.5 (3) MG/3ML nebulizer solution 3 mL (3 mLs Nebulization Given 03/23/16 0648)  predniSONE (DELTASONE) tablet 60 mg (60 mg Oral Given 03/23/16 0958)  albuterol (PROVENTIL HFA;VENTOLIN HFA) 108 (90 Base) MCG/ACT inhaler 2 puff (2 puffs Inhalation Given 03/23/16 1108)     Initial Impression / Assessment and Plan / ED Course  I have reviewed the triage vital signs and the nursing notes.  Pertinent labs & imaging results that were available during my care of the patient were reviewed by me and considered in my medical decision making (see chart for details).  Clinical Course    Patient presents with chest tightness, shortness of breath, wheezing, productive cough, nasal congestion for the past 3 days. Also endorses nausea and vomiting which have since resolved. History of hypertension, hyperlipidemia, MI. VSS. Exam revealed mild expiratory wheezes and lower lobes, no signs of respiratory distress. Remaining exam unremarkable. Patient given DuoNeb treatment.  EKG showed sinus rhythm with no acute ischemic changes, no  significant changes from prior. Troponin negative. Labs at patient's baseline values. Chest x-ray showed cardiomegaly otherwise unremarkable. HEART score 3. On reevaluation patient reports his breathing has mildly improved, expiratory wheezes noted throughout bilateral lobes. Will give patient continuous neb treatment and steroids. On reevaluation patient reports he is feeling significantly better and denies any symptoms at this time. Wheezing has significantly improved on reexamination. Patient able to tolerate PO. Patient able to stand and ambulate without assistance, pulse ox 96-99 with ambulation on room air. I suspect patient's symptoms are likely due to viral URI and do not feel that any further workup or imaging is warranted at this time. I have a low suspicion for ACS, PE, dissection, or other acute cardiac event at this time.    Final Clinical Impressions(s) / ED Diagnoses   Final diagnoses:  URI (upper respiratory infection)    New Prescriptions New Prescriptions   BENZONATATE (TESSALON) 100 MG CAPSULE    Take 2 capsules (200 mg total) by mouth 2 (two) times daily as needed for cough.   OXYMETAZOLINE (AFRIN NASAL SPRAY) 0.05 % NASAL SPRAY    Place 1 spray into both nostrils 2 (two) times daily.   PREDNISONE (DELTASONE) 20 MG TABLET    Take 2 tablets (40 mg total) by mouth daily.     Chesley Noon [place], PA-C 03/23/16 [place], DO 03/23/16 2308

## 2016-03-30 ENCOUNTER — Other Ambulatory Visit: Payer: Self-pay | Admitting: Pharmacist

## 2016-03-30 MED ORDER — RANITIDINE HCL 150 MG PO TABS: 150.0000 mg | ORAL_TABLET | Freq: Two times a day (BID) | ORAL | 2 refills | Status: DC

## 2016-03-30 MED ORDER — ALBUTEROL SULFATE HFA 108 (90 BASE) MCG/ACT IN AERS: 1.0000 | INHALATION_SPRAY | Freq: Four times a day (QID) | RESPIRATORY_TRACT | 0 refills | Status: DC | PRN

## 2016-04-18 ENCOUNTER — Ambulatory Visit: Payer: Medicaid Other | Attending: Family Medicine | Admitting: Family Medicine

## 2016-04-18 ENCOUNTER — Encounter: Payer: Self-pay | Admitting: Family Medicine

## 2016-04-18 VITALS — BP 90/67 | HR 98 | Temp 97.7°F | Ht 68.0 in | Wt 175.2 lb

## 2016-04-18 DIAGNOSIS — N183 Chronic kidney disease, stage 3 unspecified: Secondary | ICD-10-CM

## 2016-04-18 DIAGNOSIS — R0602 Shortness of breath: Secondary | ICD-10-CM | POA: Diagnosis present

## 2016-04-18 DIAGNOSIS — I252 Old myocardial infarction: Secondary | ICD-10-CM | POA: Insufficient documentation

## 2016-04-18 DIAGNOSIS — I420 Dilated cardiomyopathy: Secondary | ICD-10-CM | POA: Diagnosis not present

## 2016-04-18 DIAGNOSIS — I130 Hypertensive heart and chronic kidney disease with heart failure and stage 1 through stage 4 chronic kidney disease, or unspecified chronic kidney disease: Secondary | ICD-10-CM | POA: Diagnosis not present

## 2016-04-18 DIAGNOSIS — I959 Hypotension, unspecified: Secondary | ICD-10-CM | POA: Insufficient documentation

## 2016-04-18 DIAGNOSIS — K089 Disorder of teeth and supporting structures, unspecified: Secondary | ICD-10-CM | POA: Diagnosis not present

## 2016-04-18 DIAGNOSIS — F419 Anxiety disorder, unspecified: Secondary | ICD-10-CM | POA: Diagnosis not present

## 2016-04-18 DIAGNOSIS — I509 Heart failure, unspecified: Secondary | ICD-10-CM | POA: Insufficient documentation

## 2016-04-18 DIAGNOSIS — E7800 Pure hypercholesterolemia, unspecified: Secondary | ICD-10-CM | POA: Insufficient documentation

## 2016-04-18 DIAGNOSIS — N184 Chronic kidney disease, stage 4 (severe): Secondary | ICD-10-CM | POA: Insufficient documentation

## 2016-04-18 DIAGNOSIS — M2550 Pain in unspecified joint: Secondary | ICD-10-CM

## 2016-04-18 DIAGNOSIS — F329 Major depressive disorder, single episode, unspecified: Secondary | ICD-10-CM | POA: Diagnosis not present

## 2016-04-18 DIAGNOSIS — H547 Unspecified visual loss: Secondary | ICD-10-CM

## 2016-04-18 DIAGNOSIS — M1990 Unspecified osteoarthritis, unspecified site: Secondary | ICD-10-CM | POA: Insufficient documentation

## 2016-04-18 DIAGNOSIS — I255 Ischemic cardiomyopathy: Secondary | ICD-10-CM | POA: Insufficient documentation

## 2016-04-18 DIAGNOSIS — L309 Dermatitis, unspecified: Secondary | ICD-10-CM | POA: Diagnosis not present

## 2016-04-18 DIAGNOSIS — M109 Gout, unspecified: Secondary | ICD-10-CM | POA: Diagnosis not present

## 2016-04-18 DIAGNOSIS — I5042 Chronic combined systolic (congestive) and diastolic (congestive) heart failure: Secondary | ICD-10-CM

## 2016-04-18 MED ORDER — ALBUTEROL SULFATE HFA 108 (90 BASE) MCG/ACT IN AERS: 1.0000 | INHALATION_SPRAY | Freq: Four times a day (QID) | RESPIRATORY_TRACT | 3 refills | Status: DC | PRN

## 2016-04-18 MED ORDER — TRAMADOL HCL 50 MG PO TABS: 50.0000 mg | ORAL_TABLET | Freq: Two times a day (BID) | ORAL | 1 refills | Status: DC | PRN

## 2016-04-18 MED ORDER — ALLOPURINOL 300 MG PO TABS: 300.0000 mg | ORAL_TABLET | Freq: Every day | ORAL | 3 refills | Status: DC

## 2016-04-18 MED ORDER — TIZANIDINE HCL 4 MG PO TABS: ORAL_TABLET | ORAL | 1 refills | Status: DC

## 2016-04-18 NOTE — Progress Notes (Signed)
Medication refill- tramadol

## 2016-04-19 NOTE — Progress Notes (Signed)
Subjective:    Patient ID: [name], male    DOB: November 06, 1955, 60 y.o.   MRN: [id number]  HPI 60 year old male with a history of CHF (EF 25-30% from 2-D echo of 04/2015), chronic kidney disease stage III, Gout, dermatitis who presents today for a follow-up visit.  He complains of shortness of breath at baseline and is requesting completion of a form for handicap placard. Endorses compliance with medications and last visit to cardiology was a month ago. Denies chest pains or pedal edema no wheezing  Denies recent gout flares and has been avoiding foods that trigger gout. He does have a pruritic rash in his lower extremities which he says is not responding to topical steroids he was seen by dermatology with a questionable diagnosis of pellagra. He would like to be referred back to dermatology again. Complaints of back pain and is requesting a refill of tramadol. He requests referral to the dentist as he thinks all his teeth need to be pulled out; would also like to see an ophthalmologist due to problems with his vision.  Past Medical History:  Diagnosis Date  . Anxiety   . Arthritis    "all over" (01/07/2015)  . CKD (chronic kidney disease), stage IV ([place])   . Depression   . Headache    "probably weekly" (01/07/2015)  . Heart attack 07/2014  . History of echocardiogram 12/2014   EF 20-25%  . Homelessness   . Hypercholesterolemia   . Hypertension   . Ischemic dilated cardiomyopathy ([place])   . Noncompliance   . NSTEMI (non-ST elevated myocardial infarction) ([place]) 01/07/2015  . Polysubstance abuse    etoh, cocaine  . Urinary hesitancy   . Walking pneumonia 07/2014    Past Surgical History:  Procedure Laterality Date  . CARDIAC CATHETERIZATION    . LEFT HEART CATHETERIZATION WITH CORONARY ANGIOGRAM N/A 08/15/2014   Procedure: LEFT HEART CATHETERIZATION WITH CORONARY ANGIOGRAM;  Surgeon: Clent Demark, MD;  Location: [facility] CATH LAB;  Service: Cardiovascular;  Laterality: N/A;     No Known Allergies    Review of Systems  Constitutional: Negative for activity change and appetite change.  HENT: Positive for dental problem. Negative for sinus pressure and sore throat.   Eyes: Positive for visual disturbance.  Respiratory: Positive for shortness of breath. Negative for cough and chest tightness.   Cardiovascular: Negative for chest pain and leg swelling.  Gastrointestinal: Negative for abdominal distention, abdominal pain, constipation and diarrhea.  Endocrine: Negative.   Genitourinary: Negative for dysuria.  Musculoskeletal: Positive for back pain. Negative for joint swelling and myalgias.  Skin: Positive for rash.  Allergic/Immunologic: Negative.   Neurological: Negative for weakness, light-headedness and numbness.  Psychiatric/Behavioral: Negative for dysphoric mood and suicidal ideas.       Objective: Vitals:   04/18/16 1255  BP: 90/67  Pulse: 98  Temp: 97.7 F (36.5 C)  TempSrc: Oral  SpO2: 98%  Weight: 175 lb 3.2 oz (79.5 kg)  Height: 5\' 8"  (1.727 m)      Physical Exam  Constitutional: He is oriented to person, place, and time. He appears well-developed and well-nourished.  Neck: No JVD present.  Cardiovascular: Normal rate, normal heart sounds and intact distal pulses.   No murmur heard. Pulmonary/Chest: Effort normal and breath sounds normal. He has no wheezes. He has no rales. He exhibits no tenderness.  Abdominal: Soft. Bowel sounds are normal. He exhibits no distension and no mass. There is no tenderness.  Musculoskeletal: Normal range of motion.  Neurological: He is alert and oriented to person, place, and time.  Skin: Skin is dry.  Dry scaly patches of skin on the lower extremities    Wt Readings from Last 3 Encounters:  04/18/16 175 lb 3.2 oz (79.5 kg)  03/23/16 178 lb (80.7 kg)  02/29/16 178 lb (80.7 kg)        CMP Latest Ref Rng & Units 03/23/2016 02/29/2016 01/24/2016  Glucose 65 - 99 mg/dL 108(H) 111(H) 81  BUN 6 - 20  mg/dL 14 13 32(H)  Creatinine 0.61 - 1.24 mg/dL 1.84(H) 1.68(H) 2.12(H)  Sodium 135 - 145 mmol/L 139 142 139  Potassium 3.5 - 5.1 mmol/L 4.1 4.3 3.6  Chloride 101 - 111 mmol/L 102 112(H) 111  CO2 22 - 32 mmol/L 23 23 20(L)  Calcium 8.9 - 10.3 mg/dL 8.8(L) 9.3 9.2  Total Protein 6.5 - 8.1 g/dL - 6.5 -  Total Bilirubin 0.3 - 1.2 mg/dL - 1.4(H) -  Alkaline Phos 38 - 126 U/L - 78 -  AST 15 - 41 U/L - 28 -  ALT 17 - 63 U/L - 21 -    Assessment & Plan:  Hypotension: Blood pressure is still on the low side  Continue carvedilol and Entresto  Chronic kidney disease: Stable with last creatinine at 1.8  Congestive heart failure: EF 25-30%, NYHA III -IV No evidence of fluid overload. Continue medications; unable to tolerate ACE inhibitor due to soft blood pressure. Daily weights, limit fluid intake to 2 L per day. Keep appointment with cardiology. Form for handicap placard completed.  Dermatitis: Uncontrolled on topical steroid He would like a referral back to dermatology again.   Gout: No acute flare  Back pain: Tramadol refilled

## 2016-04-20 ENCOUNTER — Encounter: Payer: Self-pay | Admitting: Family Medicine

## 2016-04-29 ENCOUNTER — Encounter (HOSPITAL_COMMUNITY): Payer: Self-pay | Admitting: Emergency Medicine

## 2016-04-29 ENCOUNTER — Observation Stay (HOSPITAL_COMMUNITY)
Admission: EM | Admit: 2016-04-29 | Discharge: 2016-05-01 | Disposition: A | Discharge status: Home / Self Care | Payer: Medicaid Other | Attending: Cardiovascular Disease | Admitting: Cardiovascular Disease

## 2016-04-29 ENCOUNTER — Emergency Department (HOSPITAL_COMMUNITY): Payer: Medicaid Other

## 2016-04-29 DIAGNOSIS — N182 Chronic kidney disease, stage 2 (mild): Secondary | ICD-10-CM | POA: Diagnosis not present

## 2016-04-29 DIAGNOSIS — E7800 Pure hypercholesterolemia, unspecified: Secondary | ICD-10-CM | POA: Diagnosis not present

## 2016-04-29 DIAGNOSIS — I130 Hypertensive heart and chronic kidney disease with heart failure and stage 1 through stage 4 chronic kidney disease, or unspecified chronic kidney disease: Secondary | ICD-10-CM | POA: Insufficient documentation

## 2016-04-29 DIAGNOSIS — R197 Diarrhea, unspecified: Secondary | ICD-10-CM | POA: Diagnosis not present

## 2016-04-29 DIAGNOSIS — Z79899 Other long term (current) drug therapy: Secondary | ICD-10-CM | POA: Insufficient documentation

## 2016-04-29 DIAGNOSIS — E785 Hyperlipidemia, unspecified: Secondary | ICD-10-CM | POA: Diagnosis not present

## 2016-04-29 DIAGNOSIS — E860 Dehydration: Secondary | ICD-10-CM

## 2016-04-29 DIAGNOSIS — R112 Nausea with vomiting, unspecified: Secondary | ICD-10-CM

## 2016-04-29 DIAGNOSIS — Z87891 Personal history of nicotine dependence: Secondary | ICD-10-CM | POA: Insufficient documentation

## 2016-04-29 DIAGNOSIS — I420 Dilated cardiomyopathy: Secondary | ICD-10-CM | POA: Insufficient documentation

## 2016-04-29 DIAGNOSIS — M109 Gout, unspecified: Secondary | ICD-10-CM | POA: Diagnosis not present

## 2016-04-29 DIAGNOSIS — Z23 Encounter for immunization: Secondary | ICD-10-CM | POA: Insufficient documentation

## 2016-04-29 DIAGNOSIS — I959 Hypotension, unspecified: Principal | ICD-10-CM

## 2016-04-29 DIAGNOSIS — Z7902 Long term (current) use of antithrombotics/antiplatelets: Secondary | ICD-10-CM | POA: Diagnosis not present

## 2016-04-29 DIAGNOSIS — Z7952 Long term (current) use of systemic steroids: Secondary | ICD-10-CM | POA: Diagnosis not present

## 2016-04-29 DIAGNOSIS — Z9119 Patient's noncompliance with other medical treatment and regimen: Secondary | ICD-10-CM | POA: Diagnosis not present

## 2016-04-29 DIAGNOSIS — I252 Old myocardial infarction: Secondary | ICD-10-CM | POA: Insufficient documentation

## 2016-04-29 DIAGNOSIS — I5042 Chronic combined systolic (congestive) and diastolic (congestive) heart failure: Secondary | ICD-10-CM | POA: Insufficient documentation

## 2016-04-29 LAB — URINE MICROSCOPIC-ADD ON
BACTERIA UA: NONE SEEN
RBC / HPF: NONE SEEN RBC/hpf (ref 0–5)

## 2016-04-29 LAB — CBC WITH DIFFERENTIAL/PLATELET
Basophils Absolute: 0 10*3/uL (ref 0.0–0.1)
Basophils Relative: 1 %
EOS ABS: 0.1 10*3/uL (ref 0.0–0.7)
Eosinophils Relative: 2 %
HEMATOCRIT: 40.2 % (ref 39.0–52.0)
HEMOGLOBIN: 13.3 g/dL (ref 13.0–17.0)
LYMPHS ABS: 1.6 10*3/uL (ref 0.7–4.0)
Lymphocytes Relative: 41 %
MCH: 34.1 pg — AB (ref 26.0–34.0)
MCHC: 33.1 g/dL (ref 30.0–36.0)
MCV: 103.1 fL — ABNORMAL HIGH (ref 78.0–100.0)
MONOS PCT: 12 %
Monocytes Absolute: 0.5 10*3/uL (ref 0.1–1.0)
NEUTROS ABS: 1.8 10*3/uL (ref 1.7–7.7)
NEUTROS PCT: 44 %
Platelets: 141 10*3/uL — ABNORMAL LOW (ref 150–400)
RBC: 3.9 MIL/uL — AB (ref 4.22–5.81)
RDW: 13.6 % (ref 11.5–15.5)
WBC: 3.9 10*3/uL — AB (ref 4.0–10.5)

## 2016-04-29 LAB — URINALYSIS, ROUTINE W REFLEX MICROSCOPIC
Bilirubin Urine: NEGATIVE
Glucose, UA: NEGATIVE mg/dL
HGB URINE DIPSTICK: NEGATIVE
Ketones, ur: NEGATIVE mg/dL
NITRITE: NEGATIVE
PH: 5 (ref 5.0–8.0)
Protein, ur: NEGATIVE mg/dL
SPECIFIC GRAVITY, URINE: 1.007 (ref 1.005–1.030)

## 2016-04-29 LAB — I-STAT TROPONIN, ED: TROPONIN I, POC: 0.02 ng/mL (ref 0.00–0.08)

## 2016-04-29 LAB — COMPREHENSIVE METABOLIC PANEL
ALT: 18 U/L (ref 17–63)
ANION GAP: 9 (ref 5–15)
AST: 28 U/L (ref 15–41)
Albumin: 3.7 g/dL (ref 3.5–5.0)
Alkaline Phosphatase: 89 U/L (ref 38–126)
BUN: 22 mg/dL — ABNORMAL HIGH (ref 6–20)
CHLORIDE: 112 mmol/L — AB (ref 101–111)
CO2: 18 mmol/L — AB (ref 22–32)
CREATININE: 1.52 mg/dL — AB (ref 0.61–1.24)
Calcium: 9 mg/dL (ref 8.9–10.3)
GFR, EST AFRICAN AMERICAN: 55 mL/min — AB (ref >60)
GFR, EST NON AFRICAN AMERICAN: 48 mL/min — AB (ref >60)
Glucose, Bld: 84 mg/dL (ref 65–99)
POTASSIUM: 4.1 mmol/L (ref 3.5–5.1)
SODIUM: 139 mmol/L (ref 135–145)
Total Bilirubin: 1.5 mg/dL — ABNORMAL HIGH (ref 0.3–1.2)
Total Protein: 6.3 g/dL — ABNORMAL LOW (ref 6.5–8.1)

## 2016-04-29 LAB — I-STAT CG4 LACTIC ACID, ED
LACTIC ACID, VENOUS: 1.87 mmol/L (ref 0.5–1.9)
Lactic Acid, Venous: 0.82 mmol/L (ref 0.5–1.9)

## 2016-04-29 MED ORDER — ATORVASTATIN CALCIUM 80 MG PO TABS
80.0000 mg | ORAL_TABLET | Freq: Every day | ORAL | Status: DC
Start: 2016-04-30 — End: 2016-05-01
  Administered 2016-04-30 – 2016-05-01 (×2): 80 mg via ORAL
  Filled 2016-04-29 (×2): qty 1

## 2016-04-29 MED ORDER — CLOPIDOGREL BISULFATE 75 MG PO TABS
75.0000 mg | ORAL_TABLET | Freq: Every day | ORAL | Status: DC
Start: 2016-04-30 — End: 2016-05-01
  Administered 2016-04-30 – 2016-05-01 (×2): 75 mg via ORAL
  Filled 2016-04-29 (×2): qty 1

## 2016-04-29 MED ORDER — ENOXAPARIN SODIUM 40 MG/0.4ML ~~LOC~~ SOLN
40.0000 mg | SUBCUTANEOUS | Status: DC
  Administered 2016-04-30 – 2016-05-01 (×2): 40 mg via SUBCUTANEOUS
  Filled 2016-04-29 (×2): qty 0.4

## 2016-04-29 MED ORDER — ACETAMINOPHEN 650 MG RE SUPP: 650.0000 mg | Freq: Four times a day (QID) | RECTAL | Status: DC | PRN

## 2016-04-29 MED ORDER — FAMOTIDINE 20 MG PO TABS
20.0000 mg | ORAL_TABLET | Freq: Two times a day (BID) | ORAL | Status: DC
  Administered 2016-04-30 – 2016-05-01 (×4): 20 mg via ORAL
  Filled 2016-04-29 (×4): qty 1

## 2016-04-29 MED ORDER — ACETAMINOPHEN 325 MG PO TABS: 650.0000 mg | ORAL_TABLET | Freq: Four times a day (QID) | ORAL | Status: DC | PRN

## 2016-04-29 MED ORDER — ONDANSETRON HCL 4 MG PO TABS: 4.0000 mg | ORAL_TABLET | Freq: Four times a day (QID) | ORAL | Status: DC | PRN

## 2016-04-29 MED ORDER — ONDANSETRON HCL 4 MG/2ML IJ SOLN: 4.0000 mg | Freq: Four times a day (QID) | INTRAMUSCULAR | Status: DC | PRN

## 2016-04-29 MED ORDER — TRAMADOL HCL 50 MG PO TABS
50.0000 mg | ORAL_TABLET | Freq: Two times a day (BID) | ORAL | Status: DC | PRN
  Administered 2016-05-01: 50 mg via ORAL
  Filled 2016-04-29: qty 1

## 2016-04-29 MED ORDER — SODIUM CHLORIDE 0.9 % IV SOLN
INTRAVENOUS | Status: DC
Start: 2016-04-29 — End: 2016-04-30
  Administered 2016-04-29 – 2016-04-30 (×2): via INTRAVENOUS

## 2016-04-29 MED ORDER — TAMSULOSIN HCL 0.4 MG PO CAPS
0.4000 mg | ORAL_CAPSULE | Freq: Every day | ORAL | Status: DC
Start: 2016-04-30 — End: 2016-05-01
  Administered 2016-04-30 – 2016-05-01 (×2): 0.4 mg via ORAL
  Filled 2016-04-29 (×2): qty 1

## 2016-04-29 MED ORDER — B COMPLEX-C PO TABS
1.0000 | ORAL_TABLET | Freq: Every day | ORAL | Status: DC
  Administered 2016-04-30 – 2016-05-01 (×2): 1 via ORAL
  Filled 2016-04-29 (×2): qty 1

## 2016-04-29 MED ORDER — ALBUTEROL SULFATE (2.5 MG/3ML) 0.083% IN NEBU
2.5000 mg | INHALATION_SOLUTION | Freq: Four times a day (QID) | RESPIRATORY_TRACT | Status: DC | PRN
  Administered 2016-05-01: 2.5 mg via RESPIRATORY_TRACT
  Filled 2016-04-29 (×2): qty 3

## 2016-04-29 NOTE — H&P (Signed)
Referring Physician:  Dovber Ernest is an 60 y.o. male.                       Chief Complaint: Near syncope  HPI: 60 year old with PMH of non-ischemic dilated cardiomyopathy, CKD, S/P NSTEMI had nausea, vomiting and diarrhea x 3 days. He felt like passing out. He responded to IV fluids on route and in ED. Patient admits to extra beer intake.  Past Medical History:  Diagnosis Date  . Anxiety   . Arthritis    "all over" (01/07/2015)  . CKD (chronic kidney disease), stage IV ([place])   . Depression   . Headache    "probably weekly" (01/07/2015)  . Heart attack 07/2014  . History of echocardiogram 12/2014   EF 20-25%  . Homelessness   . Hypercholesterolemia   . Hypertension   . Ischemic dilated cardiomyopathy ([place])   . Noncompliance   . NSTEMI (non-ST elevated myocardial infarction) ([place]) 01/07/2015  . Polysubstance abuse    etoh, cocaine  . Urinary hesitancy   . Walking pneumonia 07/2014      Past Surgical History:  Procedure Laterality Date  . CARDIAC CATHETERIZATION    . LEFT HEART CATHETERIZATION WITH CORONARY ANGIOGRAM N/A 08/15/2014   Procedure: LEFT HEART CATHETERIZATION WITH CORONARY ANGIOGRAM;  Surgeon: Clent Demark, MD;  Location: [facility] CATH LAB;  Service: Cardiovascular;  Laterality: N/A;    No family history on file. Social History:  reports that he quit smoking about 12 years ago. His smoking use included Cigarettes. He has a 45.00 pack-year smoking history. He has never used smokeless tobacco. He reports that he drinks about 1.2 - 2.4 oz of alcohol per week . He reports that he does not use drugs.  Allergies: No Known Allergies   (Not in a hospital admission)  Results for orders placed or performed during the hospital encounter of 04/29/16 (from the past 48 hour(s))  Urinalysis, Routine w reflex microscopic (not at [facility])     Status: Abnormal   Collection Time: 04/29/16  6:30 PM  Result Value Ref Range   Color, Urine YELLOW YELLOW   APPearance CLEAR CLEAR   Specific Gravity, Urine 1.007 1.005 - 1.030   pH 5.0 5.0 - 8.0   Glucose, UA NEGATIVE NEGATIVE mg/dL   Hgb urine dipstick NEGATIVE NEGATIVE   Bilirubin Urine NEGATIVE NEGATIVE   Ketones, ur NEGATIVE NEGATIVE mg/dL   Protein, ur NEGATIVE NEGATIVE mg/dL   Nitrite NEGATIVE NEGATIVE   Leukocytes, UA TRACE (A) NEGATIVE  Urine microscopic-add on     Status: Abnormal   Collection Time: 04/29/16  6:30 PM  Result Value Ref Range   Squamous Epithelial / LPF 0-5 (A) NONE SEEN   WBC, UA 0-5 0 - 5 WBC/hpf   RBC / HPF NONE SEEN 0 - 5 RBC/hpf   Bacteria, UA NONE SEEN NONE SEEN   Casts HYALINE CASTS (A) NEGATIVE  Comprehensive metabolic panel     Status: Abnormal   Collection Time: 04/29/16  6:50 PM  Result Value Ref Range   Sodium 139 135 - 145 mmol/L   Potassium 4.1 3.5 - 5.1 mmol/L   Chloride 112 (H) 101 - 111 mmol/L   CO2 18 (L) 22 - 32 mmol/L   Glucose, Bld 84 65 - 99 mg/dL   BUN 22 (H) 6 - 20 mg/dL   Creatinine, Ser 1.52 (H) 0.61 - 1.24 mg/dL   Calcium 9.0 8.9 - 10.3 mg/dL   Total Protein 6.3 (  L) 6.5 - 8.1 g/dL   Albumin 3.7 3.5 - 5.0 g/dL   AST 28 15 - 41 U/L   ALT 18 17 - 63 U/L   Alkaline Phosphatase 89 38 - 126 U/L   Total Bilirubin 1.5 (H) 0.3 - 1.2 mg/dL   GFR calc non Af Amer 48 (L) >60 mL/min   GFR calc Af Amer 55 (L) >60 mL/min    Comment: (NOTE) The eGFR has been calculated using the CKD EPI equation. This calculation has not been validated in all clinical situations. eGFR's persistently <60 mL/min signify possible Chronic Kidney Disease.    Anion gap 9 5 - 15  CBC WITH DIFFERENTIAL     Status: Abnormal   Collection Time: 04/29/16  6:50 PM  Result Value Ref Range   WBC 3.9 (L) 4.0 - 10.5 K/uL   RBC 3.90 (L) 4.22 - 5.81 MIL/uL   Hemoglobin 13.3 13.0 - 17.0 g/dL   HCT 40.2 39.0 - 52.0 %   MCV 103.1 (H) 78.0 - 100.0 fL   MCH 34.1 (H) 26.0 - 34.0 pg   MCHC 33.1 30.0 - 36.0 g/dL   RDW 13.6 11.5 - 15.5 %   Platelets 141 (L) 150 - 400 K/uL   Neutrophils Relative % 44 %    Neutro Abs 1.8 1.7 - 7.7 K/uL   Lymphocytes Relative 41 %   Lymphs Abs 1.6 0.7 - 4.0 K/uL   Monocytes Relative 12 %   Monocytes Absolute 0.5 0.1 - 1.0 K/uL   Eosinophils Relative 2 %   Eosinophils Absolute 0.1 0.0 - 0.7 K/uL   Basophils Relative 1 %   Basophils Absolute 0.0 0.0 - 0.1 K/uL  I-Stat Troponin, ED (not at [facility])     Status: None   Collection Time: 04/29/16  7:02 PM  Result Value Ref Range   Troponin i, poc 0.02 0.00 - 0.08 ng/mL   Comment 3            Comment: Due to the release kinetics of cTnI, a negative result within the first hours of the onset of symptoms does not rule out myocardial infarction with certainty. If myocardial infarction is still suspected, repeat the test at appropriate intervals.   I-Stat CG4 Lactic Acid, ED  (not at  [facility])     Status: None   Collection Time: 04/29/16  7:04 PM  Result Value Ref Range   Lactic Acid, Venous 1.87 0.5 - 1.9 mmol/L  I-Stat CG4 Lactic Acid, ED  (not at  [facility])     Status: None   Collection Time: 04/29/16  9:12 PM  Result Value Ref Range   Lactic Acid, Venous 0.82 0.5 - 1.9 mmol/L   Dg Chest Portable 1 View  Result Date: 04/29/2016 CLINICAL DATA:  Hypotension, dizziness EXAM: PORTABLE CHEST 1 VIEW COMPARISON:  03/23/2016 FINDINGS: AP upright portable view chest demonstrates no acute infiltrate or effusion. Stable globular enlargement of the cardiomediastinal silhouette. No overt edema. No pneumothorax. IMPRESSION: Stable globular enlargement of the cardiomediastinal silhouette. No edema or infiltrate. Electronically Signed   By: Donavan Foil M.D.   On: 04/29/2016 19:01    Review Of Systems Constitutional: Negative for chills and fever. HENT: Negative for hearing loss, sorethroat or earache. Eyes: Negative for pain or visual disturbance. Respiratory: Positive for cough and shortness of breath. Cardiovascular: Positive for exertional dyspnea and occasional chest pain. Gastrointestinal: Positive for nausea, vomiting  and diarrhea. Genitourinary: Negative for arthritis and myalgia. Skin: negative for rash. Neurological : Positive  for dizziness.  Blood pressure 111/68, pulse 76, temperature 97.7 F (36.5 C), temperature source Oral, resp. rate 23, height '5\' 8"'  (1.727 m), weight 82.1 kg (181 lb), SpO2 100 %. Body mass index is 27.52 kg/m. General appearance: alert, cooperative, appears stated age and mild distress Head: Normocephalic, atraumatic Eyes: Brown eyes, conjunctivae/corneas clear. PERRL, EOM's intact.  Neck: no adenopathy, no carotid bruit, no JVD, supple, symmetrical, trachea midline and thyroid not enlarged. Resp: clear to auscultation bilaterally Cardio: regular rate and rhythm, S1, S2 normal, II/VI systolic murmur, no click, rub or gallop GI: soft, non-tender; bowel sounds normal; no masses,  no organomegaly Extremities: extremities normal, no cyanosis. Trace edema Skin: Warm and dry. No rashes or lesions Neurologic: Alert and oriented X 3, normal strength and tone.   Assessment/Plan Dehydration Non-ischemic dilated cardiomyopathy CKD, II Hyperlipidemia  Place in observation with IV fluids.  Birdie Riddle, MD  04/29/2016, 10:32 PM

## 2016-04-29 NOTE — ED Provider Notes (Signed)
[facility] DEPT Provider Note   CSN: [id number] Arrival date & time: 04/29/16  1808     History   Chief Complaint Chief Complaint  Patient presents with  . Hypotension    HPI [name] is a 60 y.o. male.  Patient presents after being hypotensive to 60's/40's.  Had a near syncopable event.  Has had 3 days of nausea, vomiting, and diarrhea.  Given 450cc by EMS.    Dizziness  Quality:  Lightheadedness Severity:  Severe Onset quality:  Sudden Duration:  1 hour Timing:  Constant Chronicity:  New Relieved by:  Nothing Worsened by:  Nothing Associated symptoms: diarrhea (x3days) and vomiting (x3days)   Associated symptoms: no chest pain, no palpitations and no shortness of breath   Risk factors: heart disease     Past Medical History:  Diagnosis Date  . Anxiety   . Arthritis    "all over" (01/07/2015)  . CKD (chronic kidney disease), stage IV ([place])   . Depression   . Headache    "probably weekly" (01/07/2015)  . Heart attack 07/2014  . History of echocardiogram 12/2014   EF 20-25%  . Homelessness   . Hypercholesterolemia   . Hypertension   . Ischemic dilated cardiomyopathy ([place])   . Noncompliance   . NSTEMI (non-ST elevated myocardial infarction) ([place]) 01/07/2015  . Polysubstance abuse    etoh, cocaine  . Urinary hesitancy   . Walking pneumonia 07/2014    Patient Active Problem List   Diagnosis Date Noted  . Dermatitis 01/27/2016  . Arthralgia 08/25/2015  . Pressure ulcer 05/22/2015  . AKI (acute kidney injury) ([place])   . Transaminitis   . Septic shock ([place])   . Multiorgan failure   . Gram-negative bacteremia   . Infection caused by Enterobacter cloacae   . E coli infection   . Eosinophilia   . Hyperbilirubinemia   . Endotracheal tube present   . Acute respiratory failure ([place])   . Renal failure (ARF), acute on chronic (HCC)   . Sepsis ([place])   . Dyspnea   . FUO (fever of unknown origin)   . Elevated LFTs   . Submandibular gland infection     . Urticaria   . Rash and nonspecific skin eruption   . Dry mouth   . Dysphagia 05/05/2015  . Sepsis due to cellulitis ([place]) 05/04/2015  . Cellulitis of neck 05/04/2015  . Neck swelling 05/03/2015  . Chronic kidney disease 05/03/2015  . Chronic combined systolic and diastolic congestive heart failure ([place]) 05/03/2015  . Benign essential HTN 05/03/2015  . History of non-ST elevation myocardial infarction (NSTEMI) 05/03/2015  . Hyponatremia 05/03/2015  . Abnormal LFTs 05/03/2015  . Leukocytosis 05/03/2015  . Polysubstance abuse   . Noncompliance   . Gout 03/24/2015    Past Surgical History:  Procedure Laterality Date  . CARDIAC CATHETERIZATION    . LEFT HEART CATHETERIZATION WITH CORONARY ANGIOGRAM N/A 08/15/2014   Procedure: LEFT HEART CATHETERIZATION WITH CORONARY ANGIOGRAM;  Surgeon: Clent Demark, MD;  Location: [facility] CATH LAB;  Service: Cardiovascular;  Laterality: N/A;       Home Medications    Prior to Admission medications   Medication Sig Start Date End Date Taking? Authorizing Provider  albuterol (PROVENTIL HFA;VENTOLIN HFA) 108 (90 Base) MCG/ACT inhaler Inhale 1-2 puffs into the lungs every 6 (six) hours as needed for wheezing or shortness of breath. 04/18/16   [name], MD  allopurinol (ZYLOPRIM) 300 MG tablet Take 1 tablet (300 mg total) by mouth daily.  04/18/16   [name], MD  atorvastatin (LIPITOR) 80 MG tablet Take 80 mg by mouth daily.    Historical Provider, MD  b complex vitamins tablet Take 1 tablet by mouth daily.    Historical Provider, MD  betamethasone valerate (VALISONE) 0.1 % cream Apply topically 2 (two) times daily. Patient not taking: Reported on 04/18/2016 09/10/15   Kinnie Feil, MD  carvedilol (COREG) 3.125 MG tablet Take 1 tablet (3.125 mg total) by mouth 2 (two) times daily with a meal. Discontinue 6.25mg  01/27/16   [name], MD  clopidogrel (PLAVIX) 75 MG tablet Take 75 mg by mouth daily. 07/15/15   Historical Provider, MD   colchicine 0.6 MG tablet Take 1 tablet (0.6 mg total) by mouth 2 (two) times daily. Patient not taking: Reported on 04/18/2016 08/02/15   Leo Grosser, MD  furosemide (LASIX) 40 MG tablet Take 2 tablets (80 mg total) by mouth 2 (two) times daily. Patient taking differently: Take 40 mg by mouth 2 (two) times daily.  07/22/15   [name], MD  Multiple Vitamins-Minerals (ONE-A-DAY 50 PLUS PO) Take 1 tablet by mouth daily.    Historical Provider, MD  nitroGLYCERIN (NITROSTAT) 0.4 MG SL tablet Place 1 tablet (0.4 mg total) under the tongue every 5 (five) minutes x 3 doses as needed for chest pain. 11/02/15   [name], MD  oxymetazoline (AFRIN NASAL SPRAY) 0.05 % nasal spray Place 1 spray into both nostrils 2 (two) times daily. 03/23/16   Nona Dell, PA-C  polyethylene glycol [facility]) packet Take 17 g by mouth daily as needed (constipation). Patient not taking: Reported on 04/18/2016 02/29/16   Olivia Canter Sam, PA-C  ramipril (ALTACE) 1.25 MG capsule Take 1.25 mg by mouth daily.    Historical Provider, MD  ranitidine (ZANTAC) 150 MG tablet Take 1 tablet (150 mg total) by mouth 2 (two) times daily. 03/30/16   [name], MD  sacubitril-valsartan (ENTRESTO) 24-26 MG Take 1 tablet by mouth 2 (two) times daily.    Historical Provider, MD  tiZANidine (ZANAFLEX) 4 MG tablet TAKE 1 TABLET BY MOUTH EVERY 8 HOURS AS NEEDED FOR MUSCLE SPASMS. 04/18/16   [name], MD  traMADol (ULTRAM) 50 MG tablet Take 1 tablet (50 mg total) by mouth every 12 (twelve) hours as needed for moderate pain or severe pain. 04/18/16   [name], MD    Family History No family history on file.  Social History Social History  Substance Use Topics  . Smoking status: Former Smoker    Packs/day: 1.50    Years: 30.00    Types: Cigarettes    Quit date: 02/19/2004  . Smokeless tobacco: Never Used  . Alcohol use 1.2 - 2.4 oz/week    2 - 4 Cans of beer per week     Comment: 2 quarts daily     Allergies   Review of  patient's allergies indicates no known allergies.   Review of Systems Review of Systems  Constitutional: Negative for chills and fever.  HENT: Negative for ear pain and sore throat.   Eyes: Negative for pain and visual disturbance.  Respiratory: Negative for cough and shortness of breath.   Cardiovascular: Negative for chest pain and palpitations.  Gastrointestinal: Positive for abdominal pain (epigastric), diarrhea (x3days) and vomiting (x3days).  Genitourinary: Negative for dysuria and hematuria.  Musculoskeletal: Negative for arthralgias and back pain.  Skin: Negative for color change and rash.  Neurological: Positive for dizziness. Negative for seizures and syncope.  All other  systems reviewed and are negative.    Physical Exam Updated Vital Signs BP (!) 87/63 (BP Location: Right Arm)   Pulse (!) 53   Temp 97.7 F (36.5 C) (Oral)   Resp 24   Ht 5\' 8"  (1.727 m)   Wt 79.4 kg   SpO2 99%   BMI 26.61 kg/m   Physical Exam  Constitutional: He is oriented to person, place, and time. He appears well-developed and well-nourished.  HENT:  Head: Normocephalic and atraumatic.  Eyes: Conjunctivae and EOM are normal. Pupils are equal, round, and reactive to light.  Neck: Normal range of motion. Neck supple.  Cardiovascular: Normal rate and regular rhythm.   Pulmonary/Chest: Effort normal and breath sounds normal. No respiratory distress.  Abdominal: Soft. There is tenderness (epigastric).  Musculoskeletal: He exhibits no edema.  Neurological: He is alert and oriented to person, place, and time.  Skin: Skin is warm and dry.  Psychiatric: He has a normal mood and affect.  Nursing note and vitals reviewed.    ED Treatments / Results  Labs (all labs ordered are listed, but only abnormal results are displayed) Labs Reviewed - No data to display  EKG  EKG Interpretation  Date/Time:  Friday April 29 2016 18:15:28 EDT Ventricular Rate:  59 PR Interval:    QRS  Duration: 99 QT Interval:  460 QTC Calculation: 456 R Axis:   -89 Text Interpretation:  Sinus rhythm Prolonged PR interval RAE, consider biatrial enlargement Inferior infarct, old Probable anteroseptal infarct, recent similar to prior EKG  Confirmed by LIU MD, DANA [phone]) on 04/29/2016 6:17:33 PM       Radiology No results found.  Procedures Procedures (including critical care time)  Medications Ordered in ED Medications - No data to display   Initial Impression / Assessment and Plan / ED Course  I have reviewed the triage vital signs and the nursing notes.  Pertinent labs & imaging results that were available during my care of the patient were reviewed by me and considered in my medical decision making (see chart for details).  Clinical Course   Mr. Kamaka is a 60 year old male with PMH significant for CHF, NSTEMI, non-ischemic cardiomyopathy, and depression who presents for dizziness with hypotension.  The patient states he has had 3 days of nausea, vomiting and diarrhea.  Likely he is dehydrated.  EMS gave 450cc bolus in route with improvement.  EKG obtained, demonstrates multiple concerning findings, but is similar to priors.  Labs obtained, including lactic acid, urine studies, blood cultures, troponin, CMP, and CBC.  Remarkable for leukopenia.  CXR obtained, personally reviewed by me, demonstrates chronic cardiomegaly and no acute changes.  Patient is admitted to cardiology for further workup and treatment.  Final Clinical Impressions(s) / ED Diagnoses   Final diagnoses:  Dehydration  Hypotension, unspecified hypotension type  Nausea vomiting and diarrhea    New Prescriptions New Prescriptions   No medications on file     Elveria Rising, MD 04/30/16 [place] Liu, MD 04/30/16 [phone]

## 2016-04-29 NOTE — ED Notes (Signed)
MD at bedside. 

## 2016-04-29 NOTE — ED Notes (Signed)
Provided patient with jello, ice cream, and sprite. Explained delay to patient, who is resting in bed at this time. In NAD, denies pain. VSS.

## 2016-04-29 NOTE — ED Provider Notes (Addendum)
I saw and evaluated the patient, reviewed the resident's note and I agree with the findings and plan.   EKG Interpretation  Date/Time:  Friday April 29 2016 18:15:28 EDT Ventricular Rate:  59 PR Interval:    QRS Duration: 99 QT Interval:  460 QTC Calculation: 456 R Axis:   -89 Text Interpretation:  Sinus rhythm Prolonged PR interval RAE, consider biatrial enlargement Inferior infarct, old Probable anteroseptal infarct, recent similar to prior EKG  Confirmed by Zakary Kimura MD, Aarnav Steagall [phone]) on 04/29/2016 6:17:33 PM      CRITICAL CARE Performed by: Forde Dandy   Total critical care time: 35 minutes  Critical care time was exclusive of separately billable procedures and treating other patients.  Critical care was necessary to treat or prevent imminent or life-threatening deterioration.  Critical care was time spent personally by me on the following activities: development of treatment plan with patient and/or surrogate as well as nursing, discussions with consultants, evaluation of patient's response to treatment, examination of patient, obtaining history from patient or surrogate, ordering and performing treatments and interventions, ordering and review of laboratory studies, ordering and review of radiographic studies, pulse oximetry and re-evaluation of patient's condition.     60 year old male who presents with hypotension. He has a history of ischemic cardiomyopathy with EF of 25%. States he took his medications earlier today as scheduled and went to take a nap. When he woke up from his nap he felt extremely lightheaded and dizzy as if he can pass out. Complains of baseline shortness of breath and orthopnea. No lower extremity edema, chest pain, or syncope. He has had 3 days of nausea, vomiting, and diarrhea with multiple known sick contacts. No recent antibiotics or travel. With mild epigastric abdominal pain associated with nausea and vomiting. No fevers. Did not take anything for  symptoms at home.  Old records are reviewed. He was recently seen in his primary care doctor's office on October 2. At that time blood pressure is low in the 90s over 60s. They have been holding his ramipril due to low blood pressures recently.  Hypotensive with systolic blood pressures in the 80s in ED. Had received a 500 mL bolus from EMS for hypotension and SBP 60s. The patient is noted to have a MAP's <65/ SBP's <90. With the current information available to me, I don't think the patient is in septic shock. The MAP's <65/ SBP's <90, is related to a medication including blood pressure medications on top of fluid loss from diarrhea and vomiting. No fever or other signs of sepsis or serious bacterial infection at this time.    Does not look fluid overloaded. Benign abdomen. Suspect he has hypovolemia causing his hypotension on top of his beta blocker and other blood pressure medications, with inability to compensate. He is mentating well. We'll continue gentle continuous IV fluids given history of severe EF and given that blood pressures slowing improving to 90s SBP and he is mentating well. Admitted to hospitalist service.         Forde Dandy, MD 04/30/16 0010    Forde Dandy, MD 04/30/16 [phone]

## 2016-04-29 NOTE — ED Triage Notes (Signed)
Per [facility]: Pt to ED from home for hypotension. Per EMS, pt woke up from a nap and upon standing, felt dizzy. Also c/o SOB at rest. EMS VS: 68/48 (after 480ml NS from EMS), 99% on 4L O2, HR 48-56 1st degree AV block and RBBB, CBG 90. Pt A&O x 4, denies any pain.

## 2016-04-30 LAB — CBC
HCT: 40.6 % (ref 39.0–52.0)
HEMOGLOBIN: 13.5 g/dL (ref 13.0–17.0)
MCH: 34.4 pg — AB (ref 26.0–34.0)
MCHC: 33.3 g/dL (ref 30.0–36.0)
MCV: 103.6 fL — AB (ref 78.0–100.0)
Platelets: 127 10*3/uL — ABNORMAL LOW (ref 150–400)
RBC: 3.92 MIL/uL — ABNORMAL LOW (ref 4.22–5.81)
RDW: 13.7 % (ref 11.5–15.5)
WBC: 3.6 10*3/uL — ABNORMAL LOW (ref 4.0–10.5)

## 2016-04-30 LAB — BASIC METABOLIC PANEL
Anion gap: 9 (ref 5–15)
BUN: 20 mg/dL (ref 6–20)
CHLORIDE: 110 mmol/L (ref 101–111)
CO2: 21 mmol/L — ABNORMAL LOW (ref 22–32)
Calcium: 9.1 mg/dL (ref 8.9–10.3)
Creatinine, Ser: 1.6 mg/dL — ABNORMAL HIGH (ref 0.61–1.24)
GFR calc Af Amer: 52 mL/min — ABNORMAL LOW (ref >60)
GFR, EST NON AFRICAN AMERICAN: 45 mL/min — AB (ref >60)
GLUCOSE: 89 mg/dL (ref 65–99)
Potassium: 4 mmol/L (ref 3.5–5.1)
SODIUM: 140 mmol/L (ref 135–145)

## 2016-04-30 LAB — URINE CULTURE: Culture: <10000 — AB

## 2016-04-30 LAB — MRSA PCR SCREENING: MRSA BY PCR: POSITIVE — AB

## 2016-04-30 MED ORDER — CHLORHEXIDINE GLUCONATE CLOTH 2 % EX PADS
6.0000 | MEDICATED_PAD | Freq: Every day | CUTANEOUS | Status: DC
  Administered 2016-04-30 – 2016-05-01 (×2): 6 via TOPICAL

## 2016-04-30 MED ORDER — INFLUENZA VAC SPLIT QUAD 0.5 ML IM SUSY
0.5000 mL | PREFILLED_SYRINGE | INTRAMUSCULAR | Status: AC
  Administered 2016-05-01: 0.5 mL via INTRAMUSCULAR
  Filled 2016-04-30: qty 0.5

## 2016-04-30 MED ORDER — MUPIROCIN 2 % EX OINT
1.0000 "application " | TOPICAL_OINTMENT | Freq: Two times a day (BID) | CUTANEOUS | Status: DC
  Administered 2016-04-30 – 2016-05-01 (×3): 1 via NASAL
  Filled 2016-04-30: qty 22

## 2016-04-30 NOTE — Progress Notes (Signed)
Ref: [name], MD   Subjective:  Feeling weak and short of breath.  Objective:  Vital Signs in the last 24 hours: Temp:  [97.5 F (36.4 C)-98.7 F (37.1 C)] 98.7 F (37.1 C) (10/14 1114) Pulse Rate:  [44-87] 87 (10/14 1114) Cardiac Rhythm: Normal sinus rhythm;Bundle branch block;Heart block (10/14 0735) Resp:  [16-28] 18 (10/14 1114) BP: (82-132)/(58-89) 104/84 (10/14 1114) SpO2:  [94 %-100 %] 100 % (10/14 1114) Weight:  [78.3 kg (172 lb 9.6 oz)-82.1 kg (181 lb)] 78.3 kg (172 lb 9.6 oz) (10/14 0248)  Physical Exam: BP Readings from Last 1 Encounters:  04/30/16 104/84    Wt Readings from Last 1 Encounters:  04/30/16 78.3 kg (172 lb 9.6 oz)    Weight change:  Body mass index is 27.03 kg/m. HEENT: [place]/AT, Eyes-Brown, PERL, EOMI, Conjunctiva-Pink, Sclera-Non-icteric Neck: No JVD, No bruit, Trachea midline. Lungs:  Clear, Bilateral. Cardiac:  Regular rhythm, normal S1 and S2, no S3. II/VI systolic murmur. Abdomen:  Soft, non-tender. Extremities:  Trace edema present. No cyanosis. No clubbing. CNS: AxOx3, Cranial nerves grossly intact, moves all 4 extremities.  Skin: Warm and dry.   Intake/Output from previous day: 10/13 0701 - 10/14 0700 In: 493.8 [I.V.:493.8] Out: 0     Lab Results: BMET    Component Value Date/Time   NA 140 04/30/2016 0328   NA 139 04/29/2016 1850   NA 139 03/23/2016 0600   K 4.0 04/30/2016 0328   K 4.1 04/29/2016 1850   K 4.1 03/23/2016 0600   CL 110 04/30/2016 0328   CL 112 (H) 04/29/2016 1850   CL 102 03/23/2016 0600   CO2 21 (L) 04/30/2016 0328   CO2 18 (L) 04/29/2016 1850   CO2 23 03/23/2016 0600   GLUCOSE 89 04/30/2016 0328   GLUCOSE 84 04/29/2016 1850   GLUCOSE 108 (H) 03/23/2016 0600   BUN 20 04/30/2016 0328   BUN 22 (H) 04/29/2016 1850   BUN 14 03/23/2016 0600   CREATININE 1.60 (H) 04/30/2016 0328   CREATININE 1.52 (H) 04/29/2016 1850   CREATININE 1.84 (H) 03/23/2016 0600   CREATININE 1.70 (H) 10/14/2015 0929   CREATININE  1.28 (H) 09/08/2015 1415   CREATININE 1.11 09/02/2015 1503   CALCIUM 9.1 04/30/2016 0328   CALCIUM 9.0 04/29/2016 1850   CALCIUM 8.8 (L) 03/23/2016 0600   GFRNONAA 45 (L) 04/30/2016 0328   GFRNONAA 48 (L) 04/29/2016 1850   GFRNONAA 38 (L) 03/23/2016 0600   GFRNONAA 43 (L) 10/14/2015 0929   GFRNONAA 64 08/25/2015 1525   GFRNONAA 52 (L) 07/24/2015 1207   GFRAA 52 (L) 04/30/2016 0328   GFRAA 55 (L) 04/29/2016 1850   GFRAA 44 (L) 03/23/2016 0600   GFRAA 49 (L) 10/14/2015 0929   GFRAA 74 08/25/2015 1525   GFRAA 60 07/24/2015 1207   CBC    Component Value Date/Time   WBC 3.6 (L) 04/30/2016 0328   RBC 3.92 (L) 04/30/2016 0328   HGB 13.5 04/30/2016 0328   HCT 40.6 04/30/2016 0328   PLT 127 (L) 04/30/2016 0328   MCV 103.6 (H) 04/30/2016 0328   MCH 34.4 (H) 04/30/2016 0328   MCHC 33.3 04/30/2016 0328   RDW 13.7 04/30/2016 0328   LYMPHSABS 1.6 04/29/2016 1850   MONOABS 0.5 04/29/2016 1850   EOSABS 0.1 04/29/2016 1850   BASOSABS 0.0 04/29/2016 1850   HEPATIC Function Panel  Recent Labs  11/01/15 0940 02/29/16 0909 04/29/16 1850  PROT 7.2 6.5 6.3*   HEMOGLOBIN A1C No components found for: HGA1C,  MPG CARDIAC ENZYMES Lab Results  Component Value Date   TROPONINI <0.03 01/24/2016   TROPONINI 0.03 03/08/2015   TROPONINI 0.04 (H) 03/08/2015   BNP No results for input(s): PROBNP in the last 8760 hours. TSH  Recent Labs  05/03/15 1542  TSH 3.213   CHOLESTEROL No results for input(s): CHOL in the last 8760 hours.  Scheduled Meds: . atorvastatin  80 mg Oral Daily  . B-complex with vitamin C  1 tablet Oral Daily  . Chlorhexidine Gluconate Cloth  6 each Topical Q0600  . clopidogrel  75 mg Oral Daily  . enoxaparin (LOVENOX) injection  40 mg Subcutaneous Q24H  . famotidine  20 mg Oral BID  . mupirocin ointment  1 application Nasal BID  . tamsulosin  0.4 mg Oral Daily   Continuous Infusions:  PRN Meds:.acetaminophen **OR** acetaminophen, albuterol, ondansetron **OR**  ondansetron (ZOFRAN) IV, traMADol  Assessment/Plan: Dehydration Non-ischemic cardiomyopathy CKD, II Hyperlipidemia  Decrease IV fluids. Increase activity.   LOS: 0 days    Dixie Dials  MD  04/30/2016, 12:21 PM

## 2016-04-30 NOTE — Progress Notes (Signed)
Patient positive for MRSA charge nurse and MD notified.

## 2016-05-01 MED ORDER — FUROSEMIDE 40 MG PO TABS
40.0000 mg | ORAL_TABLET | Freq: Every day | ORAL | Status: DC
  Administered 2016-05-01: 40 mg via ORAL
  Filled 2016-05-01: qty 1

## 2016-05-01 MED ORDER — FUROSEMIDE 40 MG PO TABS: 40.0000 mg | ORAL_TABLET | Freq: Two times a day (BID) | ORAL | Status: DC

## 2016-05-01 NOTE — Discharge Summary (Signed)
Physician Discharge Summary  Patient ID: [name] MRN: [id number] DOB/AGE: 10-11-1955 60 y.o.  Admit date: 04/29/2016 Discharge date: 05/01/2016  Admission Diagnoses: Dehydration Non-ischemic cardiomyopathy CKD II Hyperlipidemia  Discharge Diagnoses:  Principle problem: * Dehydration * Active Problems:   Non-ischemic cardiomyopathy   CKD, II   Hyperlipidemia  Discharged Condition: fair  Hospital Course: 60 year old with PMH of non-ischemic cardiomyopathy, CKD, II, S/P NSTEMI had nausea vomiting and diarrhea x 3 days. He responded well to IV fluids. His medications were resumed with discontinuation of Ramipril as he was already on Entresto. He was advised to follow low salt, low fat diet. He will see his primary docotr in 1 week and Dr. Terrence Dupont as needed.  Consults: cardiology  Significant Diagnostic Studies: labs: Mild;y low WBC and platelets count. Near normal hemoglobin. Normal electrolytes with mildly elevated Creatinine of 1.52 to 1.60.  EKG: SR, RAE, 1st degree AV block and RBBB.  CXR: Globular cardiomegaly without edema.  Treatments: cardiac meds: atorvastatin, carvedilol, Entresto 24-26 mg. twice daily, furosemide and clopidogrel.   Discharge Exam: Blood pressure 122/79, pulse 95, temperature 98.4 F (36.9 C), temperature source Oral, resp. rate 18, height 5\' 7"  (1.702 m), weight 79.2 kg (174 lb 9.7 oz), SpO2 100 %. General appearance: alert, cooperative, appears stated age and no distress Head: Normocephalic, atraumatic Eyes: conjunctivae/corneas clear. PERRL, EOM's intact.  Neck: no adenopathy, no carotid bruit, no JVD, supple, symmetrical, trachea midline and thyroid not enlarged. Resp: clear to auscultation bilaterally Cardio: Regular rate and rhythm, S1, S2 normal, II/VI systolic murmur, no click, rub or gallop GI: soft, non-tender; bowel sounds normal; no masses,  no organomegaly. Extremities: extremities normal, atraumatic, no cyanosis or edema Skin:  Warm and dry. No rashes or lesions Neurologic: Alert and oriented X 3, normal strength and tone. Normal coordination and gait.  Disposition: 01-Home or Self Care     Medication List    STOP taking these medications   ramipril 1.25 MG capsule Commonly known as:  ALTACE   tamsulosin 0.4 MG Caps capsule Commonly known as:  FLOMAX     TAKE these medications   albuterol 108 (90 Base) MCG/ACT inhaler Commonly known as:  PROVENTIL HFA;VENTOLIN HFA Inhale 1-2 puffs into the lungs every 6 (six) hours as needed for wheezing or shortness of breath.   atorvastatin 80 MG tablet Commonly known as:  LIPITOR Take 80 mg by mouth daily.   b complex vitamins tablet Take 1 tablet by mouth daily.   betamethasone valerate 0.1 % cream Commonly known as:  VALISONE Apply topically 2 (two) times daily.   carvedilol 3.125 MG tablet Commonly known as:  COREG Take 1 tablet (3.125 mg total) by mouth 2 (two) times daily with a meal. Discontinue 6.25mg    clopidogrel 75 MG tablet Commonly known as:  PLAVIX Take 75 mg by mouth daily.   colchicine 0.6 MG tablet Take 1 tablet (0.6 mg total) by mouth 2 (two) times daily.   ENTRESTO 24-26 MG Generic drug:  sacubitril-valsartan Take 1 tablet by mouth 2 (two) times daily.   furosemide 40 MG tablet Commonly known as:  LASIX Take 1 tablet (40 mg total) by mouth 2 (two) times daily.   nitroGLYCERIN 0.4 MG SL tablet Commonly known as:  NITROSTAT Place 1 tablet (0.4 mg total) under the tongue every 5 (five) minutes x 3 doses as needed for chest pain.   ONE-A-DAY 50 PLUS PO Take 1 tablet by mouth daily.   oxymetazoline 0.05 % nasal spray Commonly  known as:  AFRIN NASAL SPRAY Place 1 spray into both nostrils 2 (two) times daily.   ranitidine 150 MG tablet Commonly known as:  ZANTAC Take 1 tablet (150 mg total) by mouth 2 (two) times daily.   tiZANidine 4 MG tablet Commonly known as:  ZANAFLEX TAKE 1 TABLET BY MOUTH EVERY 8 HOURS AS NEEDED FOR  MUSCLE SPASMS.   traMADol 50 MG tablet Commonly known as:  ULTRAM Take 1 tablet (50 mg total) by mouth every 12 (twelve) hours as needed for moderate pain or severe pain.      Follow-up Information    [name], MD. Schedule an appointment as soon as possible for a visit in 1 week(s).   Specialty:  Family Medicine Contact information: [place] [address] [phone]           Signed: Birdie Riddle 05/01/2016, 1:20 PM

## 2016-05-01 NOTE — Progress Notes (Signed)
Patient is discharge to home accompanied by patient's family friend and NT via wheelchair. Discharge instructions given . Patient verbalizes understanding. All personal belongings given. Telemetry box and IV removed prior to discharge and site in good condition.

## 2016-05-04 LAB — CULTURE, BLOOD (ROUTINE X 2)
CULTURE: NO GROWTH
Culture: NO GROWTH

## 2016-05-11 ENCOUNTER — Telehealth: Payer: Self-pay | Admitting: Family Medicine

## 2016-05-11 NOTE — Telephone Encounter (Signed)
Called patient to verify his home address since we received a letter address to him from [place] back.

## 2016-05-12 ENCOUNTER — Inpatient Hospital Stay: Payer: Medicaid Other

## 2016-05-18 ENCOUNTER — Other Ambulatory Visit: Payer: Self-pay | Admitting: Family Medicine

## 2016-05-18 ENCOUNTER — Telehealth: Payer: Self-pay

## 2016-05-18 DIAGNOSIS — M2550 Pain in unspecified joint: Secondary | ICD-10-CM

## 2016-05-18 MED ORDER — TRAMADOL HCL 50 MG PO TABS: 50.0000 mg | ORAL_TABLET | Freq: Two times a day (BID) | ORAL | 1 refills | Status: DC | PRN

## 2016-05-18 NOTE — Telephone Encounter (Signed)
Writer called patient to let him know that his requested prescription for tramadol is waiting for him at the front desk.  Patient stated understanding and will pick it up today.

## 2016-06-06 ENCOUNTER — Other Ambulatory Visit: Payer: Self-pay | Admitting: Pharmacist

## 2016-06-06 MED ORDER — RANITIDINE HCL 150 MG PO TABS: 150.0000 mg | ORAL_TABLET | Freq: Two times a day (BID) | ORAL | 2 refills | Status: DC

## 2016-06-11 ENCOUNTER — Emergency Department (HOSPITAL_COMMUNITY): Payer: Medicaid Other

## 2016-06-11 ENCOUNTER — Encounter (HOSPITAL_COMMUNITY): Payer: Self-pay

## 2016-06-11 ENCOUNTER — Inpatient Hospital Stay (HOSPITAL_COMMUNITY)
Admission: EM | Admit: 2016-06-11 | Discharge: 2016-06-12 | DRG: 292 | Disposition: A | Discharge status: Home / Self Care | Payer: Medicaid Other | Attending: Internal Medicine | Admitting: Internal Medicine

## 2016-06-11 DIAGNOSIS — F411 Generalized anxiety disorder: Secondary | ICD-10-CM | POA: Diagnosis present

## 2016-06-11 DIAGNOSIS — K219 Gastro-esophageal reflux disease without esophagitis: Secondary | ICD-10-CM | POA: Diagnosis present

## 2016-06-11 DIAGNOSIS — N183 Chronic kidney disease, stage 3 unspecified: Secondary | ICD-10-CM | POA: Diagnosis present

## 2016-06-11 DIAGNOSIS — N184 Chronic kidney disease, stage 4 (severe): Secondary | ICD-10-CM | POA: Diagnosis present

## 2016-06-11 DIAGNOSIS — R0602 Shortness of breath: Secondary | ICD-10-CM

## 2016-06-11 DIAGNOSIS — I2510 Atherosclerotic heart disease of native coronary artery without angina pectoris: Secondary | ICD-10-CM | POA: Diagnosis present

## 2016-06-11 DIAGNOSIS — Z7902 Long term (current) use of antithrombotics/antiplatelets: Secondary | ICD-10-CM

## 2016-06-11 DIAGNOSIS — F329 Major depressive disorder, single episode, unspecified: Secondary | ICD-10-CM | POA: Diagnosis present

## 2016-06-11 DIAGNOSIS — Z79899 Other long term (current) drug therapy: Secondary | ICD-10-CM

## 2016-06-11 DIAGNOSIS — N400 Enlarged prostate without lower urinary tract symptoms: Secondary | ICD-10-CM | POA: Diagnosis present

## 2016-06-11 DIAGNOSIS — F1910 Other psychoactive substance abuse, uncomplicated: Secondary | ICD-10-CM | POA: Diagnosis present

## 2016-06-11 DIAGNOSIS — I130 Hypertensive heart and chronic kidney disease with heart failure and stage 1 through stage 4 chronic kidney disease, or unspecified chronic kidney disease: Principal | ICD-10-CM | POA: Diagnosis present

## 2016-06-11 DIAGNOSIS — I252 Old myocardial infarction: Secondary | ICD-10-CM

## 2016-06-11 DIAGNOSIS — I420 Dilated cardiomyopathy: Secondary | ICD-10-CM | POA: Diagnosis present

## 2016-06-11 DIAGNOSIS — F1421 Cocaine dependence, in remission: Secondary | ICD-10-CM

## 2016-06-11 DIAGNOSIS — F419 Anxiety disorder, unspecified: Secondary | ICD-10-CM | POA: Diagnosis not present

## 2016-06-11 DIAGNOSIS — E785 Hyperlipidemia, unspecified: Secondary | ICD-10-CM

## 2016-06-11 DIAGNOSIS — I5022 Chronic systolic (congestive) heart failure: Secondary | ICD-10-CM | POA: Diagnosis present

## 2016-06-11 DIAGNOSIS — Z87891 Personal history of nicotine dependence: Secondary | ICD-10-CM | POA: Diagnosis not present

## 2016-06-11 DIAGNOSIS — E861 Hypovolemia: Secondary | ICD-10-CM | POA: Diagnosis present

## 2016-06-11 DIAGNOSIS — R0609 Other forms of dyspnea: Secondary | ICD-10-CM | POA: Diagnosis present

## 2016-06-11 DIAGNOSIS — I10 Essential (primary) hypertension: Secondary | ICD-10-CM | POA: Diagnosis present

## 2016-06-11 LAB — URINE MICROSCOPIC-ADD ON
Bacteria, UA: NONE SEEN
RBC / HPF: NONE SEEN RBC/hpf (ref 0–5)

## 2016-06-11 LAB — CBC WITH DIFFERENTIAL/PLATELET
BASOS PCT: 2 %
Basophils Absolute: 0.1 10*3/uL (ref 0.0–0.1)
Eosinophils Absolute: 0.2 10*3/uL (ref 0.0–0.7)
Eosinophils Relative: 5 %
HEMATOCRIT: 44.8 % (ref 39.0–52.0)
HEMOGLOBIN: 14.6 g/dL (ref 13.0–17.0)
LYMPHS ABS: 1.9 10*3/uL (ref 0.7–4.0)
Lymphocytes Relative: 37 %
MCH: 34.2 pg — AB (ref 26.0–34.0)
MCHC: 32.6 g/dL (ref 30.0–36.0)
MCV: 104.9 fL — ABNORMAL HIGH (ref 78.0–100.0)
MONOS PCT: 14 %
Monocytes Absolute: 0.7 10*3/uL (ref 0.1–1.0)
NEUTROS ABS: 2.2 10*3/uL (ref 1.7–7.7)
NEUTROS PCT: 42 %
Platelets: 122 10*3/uL — ABNORMAL LOW (ref 150–400)
RBC: 4.27 MIL/uL (ref 4.22–5.81)
RDW: 15.1 % (ref 11.5–15.5)
WBC: 5.1 10*3/uL (ref 4.0–10.5)

## 2016-06-11 LAB — URINALYSIS, ROUTINE W REFLEX MICROSCOPIC
BILIRUBIN URINE: NEGATIVE
Glucose, UA: NEGATIVE mg/dL
HGB URINE DIPSTICK: NEGATIVE
Ketones, ur: NEGATIVE mg/dL
Leukocytes, UA: NEGATIVE
Nitrite: NEGATIVE
PROTEIN: 100 mg/dL — AB
Specific Gravity, Urine: 1.013 (ref 1.005–1.030)
pH: 6 (ref 5.0–8.0)

## 2016-06-11 LAB — COMPREHENSIVE METABOLIC PANEL
ALBUMIN: 3.7 g/dL (ref 3.5–5.0)
ALK PHOS: 112 U/L (ref 38–126)
ALT: 27 U/L (ref 17–63)
ANION GAP: 11 (ref 5–15)
AST: 52 U/L — ABNORMAL HIGH (ref 15–41)
BILIRUBIN TOTAL: 2.3 mg/dL — AB (ref 0.3–1.2)
BUN: 25 mg/dL — ABNORMAL HIGH (ref 6–20)
CALCIUM: 9.2 mg/dL (ref 8.9–10.3)
CO2: 20 mmol/L — ABNORMAL LOW (ref 22–32)
CREATININE: 1.86 mg/dL — AB (ref 0.61–1.24)
Chloride: 109 mmol/L (ref 101–111)
GFR calc Af Amer: 43 mL/min — ABNORMAL LOW (ref >60)
GFR calc non Af Amer: 37 mL/min — ABNORMAL LOW (ref >60)
GLUCOSE: 98 mg/dL (ref 65–99)
Potassium: 4.2 mmol/L (ref 3.5–5.1)
Sodium: 140 mmol/L (ref 135–145)
TOTAL PROTEIN: 6.3 g/dL — AB (ref 6.5–8.1)

## 2016-06-11 LAB — RAPID URINE DRUG SCREEN, HOSP PERFORMED
AMPHETAMINES: NOT DETECTED
BARBITURATES: NOT DETECTED
BENZODIAZEPINES: NOT DETECTED
Cocaine: NOT DETECTED
Opiates: NOT DETECTED
Tetrahydrocannabinol: NOT DETECTED

## 2016-06-11 LAB — I-STAT TROPONIN, ED: Troponin i, poc: 0.03 ng/mL (ref 0.00–0.08)

## 2016-06-11 LAB — TROPONIN I: Troponin I: 0.03 ng/mL (ref <0.03)

## 2016-06-11 LAB — LIPASE, BLOOD: Lipase: 19 U/L (ref 11–51)

## 2016-06-11 LAB — BRAIN NATRIURETIC PEPTIDE: B Natriuretic Peptide: 4425.2 pg/mL — ABNORMAL HIGH (ref 0.0–100.0)

## 2016-06-11 MED ORDER — ENOXAPARIN SODIUM 30 MG/0.3ML ~~LOC~~ SOLN
30.0000 mg | SUBCUTANEOUS | Status: DC
  Administered 2016-06-11: 30 mg via SUBCUTANEOUS
  Filled 2016-06-11: qty 0.3

## 2016-06-11 MED ORDER — FAMOTIDINE 20 MG PO TABS
20.0000 mg | ORAL_TABLET | Freq: Two times a day (BID) | ORAL | Status: DC
  Administered 2016-06-11 – 2016-06-12 (×2): 20 mg via ORAL
  Filled 2016-06-11 (×2): qty 1

## 2016-06-11 MED ORDER — ATORVASTATIN CALCIUM 80 MG PO TABS
80.0000 mg | ORAL_TABLET | Freq: Every day | ORAL | Status: DC
  Administered 2016-06-12: 80 mg via ORAL
  Filled 2016-06-11: qty 1

## 2016-06-11 MED ORDER — ASPIRIN EC 81 MG PO TBEC
81.0000 mg | DELAYED_RELEASE_TABLET | Freq: Every day | ORAL | Status: DC
  Administered 2016-06-11 – 2016-06-12 (×2): 81 mg via ORAL
  Filled 2016-06-11 (×2): qty 1

## 2016-06-11 MED ORDER — NITROGLYCERIN 0.4 MG SL SUBL: 0.4000 mg | SUBLINGUAL_TABLET | SUBLINGUAL | Status: DC | PRN

## 2016-06-11 MED ORDER — POTASSIUM CHLORIDE CRYS ER 20 MEQ PO TBCR
20.0000 meq | EXTENDED_RELEASE_TABLET | Freq: Every day | ORAL | Status: DC
  Administered 2016-06-12: 20 meq via ORAL
  Filled 2016-06-11: qty 1

## 2016-06-11 MED ORDER — SACUBITRIL-VALSARTAN 24-26 MG PO TABS
1.0000 | ORAL_TABLET | Freq: Two times a day (BID) | ORAL | Status: DC
  Administered 2016-06-11 – 2016-06-12 (×2): 1 via ORAL
  Filled 2016-06-11 (×3): qty 1

## 2016-06-11 MED ORDER — SODIUM CHLORIDE 0.9 % IV BOLUS (SEPSIS)
500.0000 mL | Freq: Once | INTRAVENOUS | Status: AC
  Administered 2016-06-11: 500 mL via INTRAVENOUS

## 2016-06-11 MED ORDER — TAMSULOSIN HCL 0.4 MG PO CAPS
0.4000 mg | ORAL_CAPSULE | Freq: Every day | ORAL | Status: DC
  Administered 2016-06-12: 0.4 mg via ORAL
  Filled 2016-06-11: qty 1

## 2016-06-11 MED ORDER — B COMPLEX-C PO TABS
1.0000 | ORAL_TABLET | Freq: Every day | ORAL | Status: DC
  Administered 2016-06-12: 1 via ORAL
  Filled 2016-06-11: qty 1

## 2016-06-11 MED ORDER — TIZANIDINE HCL 4 MG PO TABS
4.0000 mg | ORAL_TABLET | Freq: Three times a day (TID) | ORAL | Status: DC | PRN
  Filled 2016-06-11: qty 1

## 2016-06-11 NOTE — ED Triage Notes (Signed)
Patient presents to the ed with ems from home, he called 911, he complains of dizziness with movement, weakness all over, and shortness of breath, denies any pain, no meds on board.

## 2016-06-11 NOTE — ED Provider Notes (Signed)
[facility] DEPT Provider Note   CSN: [id number] Arrival date & time: 06/11/16  1522     History   Chief Complaint Chief Complaint  Patient presents with  . Fatigue    HPI [name] is a 60 y.o. male.  HPI   Patient is a 60 year old male with a history of CAD, depression, NSTEMI, HTN, cardiomyopathy, polysubstance abuse who presents to the emergency department with shortness of breath and dizziness onset this morning. Patient states he woke a breakfast took his medications and experienced symptoms. He states he did not take more blood pressure medication that he supposed to. Patient states his last drink of alcohol was 2 days ago he had one beer. Patient denies illicit drug use. Patient denies chest pain. Patient states he's been having abdominal bloating and right upper quadrant tenderness for a "a while". Patient states he's also been coughing due to his medication. He states this coughing causes him to vomit almost once daily. Patient denies syncope, visual changes, diarrhea, blood in his vomit, blood in the stool, numbness or weakness.  Past Medical History:  Diagnosis Date  . Anxiety   . Arthritis    "all over" (01/07/2015)  . CKD (chronic kidney disease), stage IV ([facility])   . Depression   . Headache    "probably weekly" (01/07/2015)  . Heart attack 07/2014  . History of echocardiogram 12/2014   EF 20-25%  . Homelessness   . Hypercholesterolemia   . Hypertension   . Ischemic dilated cardiomyopathy ([facility])   . Noncompliance   . NSTEMI (non-ST elevated myocardial infarction) ([facility]) 01/07/2015  . Polysubstance abuse    etoh, cocaine  . Urinary hesitancy   . Walking pneumonia 07/2014    Patient Active Problem List   Diagnosis Date Noted  . Dehydration 04/29/2016  . Dermatitis 01/27/2016  . Arthralgia 08/25/2015  . Pressure ulcer 05/22/2015  . AKI (acute kidney injury) ([facility])   . Transaminitis   . Septic shock ([facility])   . Multiorgan failure   . Gram-negative  bacteremia   . Infection caused by Enterobacter cloacae   . E coli infection   . Eosinophilia   . Hyperbilirubinemia   . Endotracheal tube present   . Acute respiratory failure ([facility])   . Renal failure (ARF), acute on chronic (HCC)   . Sepsis ([facility])   . Dyspnea   . FUO (fever of unknown origin)   . Elevated LFTs   . Submandibular gland infection   . Urticaria   . Rash and nonspecific skin eruption   . Dry mouth   . Dysphagia 05/05/2015  . Sepsis due to cellulitis ([facility]) 05/04/2015  . Cellulitis of neck 05/04/2015  . Neck swelling 05/03/2015  . Chronic kidney disease 05/03/2015  . Chronic combined systolic and diastolic congestive heart failure ([facility]) 05/03/2015  . Benign essential HTN 05/03/2015  . History of non-ST elevation myocardial infarction (NSTEMI) 05/03/2015  . Hyponatremia 05/03/2015  . Abnormal LFTs 05/03/2015  . Leukocytosis 05/03/2015  . Polysubstance abuse   . Noncompliance   . Gout 03/24/2015    Past Surgical History:  Procedure Laterality Date  . CARDIAC CATHETERIZATION    . LEFT HEART CATHETERIZATION WITH CORONARY ANGIOGRAM N/A 08/15/2014   Procedure: LEFT HEART CATHETERIZATION WITH CORONARY ANGIOGRAM;  Surgeon: Clent Demark, MD;  Location: [facility] CATH LAB;  Service: Cardiovascular;  Laterality: N/A;       Home Medications    Prior to Admission medications   Medication Sig Start Date End Date Taking? Authorizing  Provider  albuterol (PROVENTIL HFA;VENTOLIN HFA) 108 (90 Base) MCG/ACT inhaler Inhale 1-2 puffs into the lungs every 6 (six) hours as needed for wheezing or shortness of breath. 04/18/16   [name], MD  atorvastatin (LIPITOR) 80 MG tablet Take 80 mg by mouth daily.    Historical Provider, MD  b complex vitamins tablet Take 1 tablet by mouth daily.    Historical Provider, MD  betamethasone valerate (VALISONE) 0.1 % cream Apply topically 2 (two) times daily. Patient not taking: Reported on 04/29/2016 09/10/15   Kinnie Feil, MD  carvedilol  (COREG) 3.125 MG tablet Take 1 tablet (3.125 mg total) by mouth 2 (two) times daily with a meal. Discontinue 6.25mg  01/27/16   [name], MD  clopidogrel (PLAVIX) 75 MG tablet Take 75 mg by mouth daily. 07/15/15   Historical Provider, MD  colchicine 0.6 MG tablet Take 1 tablet (0.6 mg total) by mouth 2 (two) times daily. 08/02/15   Leo Grosser, MD  furosemide (LASIX) 40 MG tablet Take 1 tablet (40 mg total) by mouth 2 (two) times daily. 05/01/16   Dixie Dials, MD  Multiple Vitamins-Minerals (ONE-A-DAY 50 PLUS PO) Take 1 tablet by mouth daily.    Historical Provider, MD  nitroGLYCERIN (NITROSTAT) 0.4 MG SL tablet Place 1 tablet (0.4 mg total) under the tongue every 5 (five) minutes x 3 doses as needed for chest pain. 11/02/15   [name], MD  oxymetazoline (AFRIN NASAL SPRAY) 0.05 % nasal spray Place 1 spray into both nostrils 2 (two) times daily. 03/23/16   Nona Dell, PA-C  ranitidine (ZANTAC) 150 MG tablet Take 1 tablet (150 mg total) by mouth 2 (two) times daily. 06/06/16   [name], MD  sacubitril-valsartan (ENTRESTO) 24-26 MG Take 1 tablet by mouth 2 (two) times daily.    Historical Provider, MD  tiZANidine (ZANAFLEX) 4 MG tablet TAKE 1 TABLET BY MOUTH EVERY 8 HOURS AS NEEDED FOR MUSCLE SPASMS. 04/18/16   [name], MD  traMADol (ULTRAM) 50 MG tablet Take 1 tablet (50 mg total) by mouth every 12 (twelve) hours as needed for moderate pain or severe pain. 05/18/16   [name], MD    Family History No family history on file.  Social History Social History  Substance Use Topics  . Smoking status: Former Smoker    Packs/day: 1.50    Years: 30.00    Types: Cigarettes    Quit date: 02/19/2004  . Smokeless tobacco: Never Used  . Alcohol use 1.2 - 2.4 oz/week    2 - 4 Cans of beer per week     Comment: 2 quarts daily     Allergies   Patient has no known allergies.   Review of Systems Review of Systems  Constitutional: Negative for chills and fever.  HENT:  Negative for sore throat.   Eyes: Negative for visual disturbance.  Respiratory: Positive for cough and shortness of breath. Negative for chest tightness.   Cardiovascular: Negative for chest pain.  Gastrointestinal: Positive for abdominal pain and vomiting. Negative for constipation, diarrhea and nausea.  Genitourinary: Negative for dysuria.  Musculoskeletal: Negative for arthralgias.  Skin: Negative for rash.  Neurological: Positive for dizziness. Negative for syncope, weakness, numbness and headaches.     Physical Exam Updated Vital Signs BP 114/88 (BP Location: Right Arm)   Pulse 76   Resp 21   SpO2 94%   Physical Exam  Constitutional: He appears well-developed and well-nourished. No distress.  initially pt was agitated then calmed down  HENT:  Head: Normocephalic and atraumatic.  Mouth/Throat: Uvula is midline and oropharynx is clear and moist. Mucous membranes are dry. No trismus in the jaw. No uvula swelling. No oropharyngeal exudate, posterior oropharyngeal edema, posterior oropharyngeal erythema or tonsillar abscesses.  Eyes: Lids are normal. Pupils are equal, round, and reactive to light. Right conjunctiva is injected. Left conjunctiva is injected.  Cardiovascular: Normal rate and regular rhythm.  Exam reveals distant heart sounds. Exam reveals no gallop and no friction rub.   No murmur heard. Pulses:      Radial pulses are 1+ on the right side, and 1+ on the left side.  Pulmonary/Chest: Effort normal and breath sounds normal. No respiratory distress. He has no decreased breath sounds. He has no wheezes. He has no rhonchi. He has no rales.  Abdominal: Soft. Normal appearance and bowel sounds are normal. There is tenderness in the right upper quadrant and epigastric area. There is no rigidity, no rebound and no guarding.  Musculoskeletal: Normal range of motion. He exhibits edema (mild edema to BLE).  Neurological: He is alert. Coordination normal. GCS eye subscore is 4. GCS  verbal subscore is 5. GCS motor subscore is 6.  Skin: Skin is warm and dry. He is not diaphoretic.  Psychiatric: He has a normal mood and affect. His behavior is normal.  Nursing note and vitals reviewed.    ED Treatments / Results  Labs (all labs ordered are listed, but only abnormal results are displayed) Labs Reviewed - No data to display  EKG  EKG Interpretation  Date/Time:  Saturday June 11 2016 15:29:10 EST Ventricular Rate:  72 PR Interval:    QRS Duration: 193 QT Interval:  498 QTC Calculation: 546 R Axis:   -102 Text Interpretation:  Sinus rhythm Prolonged PR interval Right bundle branch block No significant change since last tracing Confirmed by [facility] MD, PEDRO ([id number]) on 06/11/2016 3:37:26 PM       Radiology No results found.  Procedures Procedures (including critical care time)  Medications Ordered in ED Medications - No data to display   Initial Impression / Assessment and Plan / ED Course  I have reviewed the triage vital signs and the nursing notes.  Pertinent labs & imaging results that were available during my care of the patient were reviewed by me and considered in my medical decision making (see chart for details).  Clinical Course     Patient presents for shortness of breath and dizziness. Patient lives by himself and no family at bedside. Unsure of his baseline mental status. Patient may be confused. Basic labs were drawn. EKG revealed no changes since last tracing. Labs revealed a BNP of 4425. Symptoms likely 2/2 CHF exacerbation. Oxygen sats in the low 90s upper 80s. Will consult the hospitalist team for admission for CHF exacerbation. I spoke with Dr. Benjamine Mola who will admit the patient.   Thank you Dr. Benjamine Mola for your consult, time and care of this pt.   Shared visit with Dr. Leonette Monarch who saw the pt and agrees with the above pla.  Final Clinical Impressions(s) / ED Diagnoses   Final diagnoses:  Shortness of breath    New  Prescriptions New Prescriptions   No medications on file     Kalman Drape, [place] 06/11/16 [place], MD 06/12/16 [phone]

## 2016-06-11 NOTE — ED Notes (Signed)
Admitting at bedside 

## 2016-06-11 NOTE — H&P (Signed)
Date: 06/11/2016               Patient Name:  [name] MRN: [id number]  DOB: 01-Apr-1956 Age / Sex: 60 y.o., male   PCP: [name], MD         Medical Service: Internal Medicine Teaching Service         Attending Physician: Dr. Gilles Chiquito    First Contact: Dr. Inda Castle Pager: [phone]  Second Contact: Dr. Benjamine Mola Pager: [phone]       After Hours (After 5p/  First Contact Pager: [phone]  weekends / holidays): Second Contact Pager: [phone]   Chief Complaint: shortness of breath  History of Present Illness: Mr Wenger is a 60 year old man with history of CAD, CHF (EF 25-30%), HTN, cocaine abuse, and CKD4 who presents with acute on chronic exertional dyspnea.  Mr Ernsberger is a vague and inconsistent informant, limiting the history obtained.  For the past several months, Mr Cianci has noticed worsening exertional dyspnea.  He used to be able to walk to store from his apartment, but now says he has to stop and rest after only walking 10 feet.  He occasionally has chest pain, but mostly when he's worrying and anxious and not associated with exertion.  He regularly wakes up gasping for air at night.  Sleeps flat on one pillow.  Since waking up this morning, he breathing has been even worse  Denies cough, fever, weight gain/loss, edema.  Meds:  Current Meds  Medication Sig  . albuterol (PROVENTIL HFA;VENTOLIN HFA) 108 (90 Base) MCG/ACT inhaler Inhale 1-2 puffs into the lungs every 6 (six) hours as needed for wheezing or shortness of breath.  Marland Kitchen atorvastatin (LIPITOR) 80 MG tablet Take 80 mg by mouth daily.  Marland Kitchen b complex vitamins tablet Take 1 tablet by mouth daily.  . carvedilol (COREG) 3.125 MG tablet Take 1 tablet (3.125 mg total) by mouth 2 (two) times daily with a meal. Discontinue 6.25mg   . furosemide (LASIX) 40 MG tablet Take 1 tablet (40 mg total) by mouth 2 (two) times daily. (Patient taking differently: Take 80 mg by mouth 2 (two) times daily. )  . nitroGLYCERIN  (NITROSTAT) 0.4 MG SL tablet Place 1 tablet (0.4 mg total) under the tongue every 5 (five) minutes x 3 doses as needed for chest pain.  Marland Kitchen oxymetazoline (AFRIN NASAL SPRAY) 0.05 % nasal spray Place 1 spray into both nostrils 2 (two) times daily.  . potassium chloride SA (K-DUR,KLOR-CON) 20 MEQ tablet Take 20 mEq by mouth daily.  . ranitidine (ZANTAC) 150 MG tablet Take 1 tablet (150 mg total) by mouth 2 (two) times daily.  . sacubitril-valsartan (ENTRESTO) 24-26 MG Take 1 tablet by mouth 2 (two) times daily.  . tamsulosin (FLOMAX) 0.4 MG CAPS capsule Take 0.4 mg by mouth daily.  Marland Kitchen tiZANidine (ZANAFLEX) 4 MG tablet TAKE 1 TABLET BY MOUTH EVERY 8 HOURS AS NEEDED FOR MUSCLE SPASMS.     Allergies: Allergies as of 06/11/2016  . (No Known Allergies)   Past Medical History:  Diagnosis Date  . Anxiety   . Arthritis    "all over" (01/07/2015)  . CKD (chronic kidney disease), stage IV ([place])   . Depression   . Headache    "probably weekly" (01/07/2015)  . Heart attack 07/2014  . History of echocardiogram 12/2014   EF 20-25%  . Homelessness   . Hypercholesterolemia   . Hypertension   . Ischemic dilated cardiomyopathy ([place])   . Noncompliance   .  NSTEMI (non-ST elevated myocardial infarction) ([place]) 01/07/2015  . Polysubstance abuse    etoh, cocaine  . Urinary hesitancy   . Walking pneumonia 07/2014    Family History:  Parents may have had diabetes Unable to provide any other FH  Social History:  Drinks 2 beers a week   Denies cocaine use since 2015 Quit smoking 10 years ago. 30 pack year history Lives in apartment  Review of Systems: A complete ROS was negative except as per HPI.  Physical Exam: Blood pressure 98/87, pulse 63, resp. rate 22, SpO2 90 %.  Physical Exam  Constitutional: He is oriented to person, place, and time. He appears well-developed and well-nourished. No distress.  HENT:  Head: Normocephalic and atraumatic.  Mouth/Throat: Oropharynx is clear and moist.  Eyes:  Conjunctivae are normal. Pupils are equal, round, and reactive to light. No scleral icterus.  Neck: Normal range of motion. Neck supple. No JVD present.  Cardiovascular:  Regular rate and rhythm S3 gallop 2/6 systolic murmur loudest at apex  Pulmonary/Chest:  No respiratory distress Good air movement No wheezes Trace crackles in R lower lung field  Abdominal: Soft. He exhibits no distension.  Mild RUQ tenderness to palpation  Musculoskeletal: He exhibits no tenderness.  Trace bilateral LE edema to mid shin  Neurological: He is alert and oriented to person, place, and time.  Skin: Skin is warm and dry.  Psychiatric: He has a normal mood and affect. His behavior is normal.     CBC Latest Ref Rng & Units 06/11/2016 04/30/2016 04/29/2016  WBC 4.0 - 10.5 K/uL 5.1 3.6(L) 3.9(L)  Hemoglobin 13.0 - 17.0 g/dL 14.6 13.5 13.3  Hematocrit 39.0 - 52.0 % 44.8 40.6 40.2  Platelets 150 - 400 K/uL 122(L) 127(L) 141(L)   CMP Latest Ref Rng & Units 06/11/2016 04/30/2016 04/29/2016  Glucose 65 - 99 mg/dL 98 89 84  BUN 6 - 20 mg/dL 25(H) 20 22(H)  Creatinine 0.61 - 1.24 mg/dL 1.86(H) 1.60(H) 1.52(H)  Sodium 135 - 145 mmol/L 140 140 139  Potassium 3.5 - 5.1 mmol/L 4.2 4.0 4.1  Chloride 101 - 111 mmol/L 109 110 112(H)  CO2 22 - 32 mmol/L 20(L) 21(L) 18(L)  Calcium 8.9 - 10.3 mg/dL 9.2 9.1 9.0  Total Protein 6.5 - 8.1 g/dL 6.3(L) - 6.3(L)  Total Bilirubin 0.3 - 1.2 mg/dL 2.3(H) - 1.5(H)  Alkaline Phos 38 - 126 U/L 112 - 89  AST 15 - 41 U/L 52(H) - 28  ALT 17 - 63 U/L 27 - 18   Troponin (Point of Care Test)  Recent Labs  06/11/16 1615  TROPIPOC 0.03   BNP (last 3 results)  Recent Labs  10/14/15 0929 12/08/15 1445 06/11/16 1604  BNP 260.2* 1,498.8* 4,425.2*    Drugs of Abuse     Component Value Date/Time   LABOPIA NONE DETECTED 06/11/2016 1953   COCAINSCRNUR NONE DETECTED 06/11/2016 1953   LABBENZ NONE DETECTED 06/11/2016 1953   AMPHETMU NONE DETECTED 06/11/2016 1953   THCU  NONE DETECTED 06/11/2016 1953   LABBARB NONE DETECTED 06/11/2016 1953    EKG 06/11/2016 NSR, prolonged QRS, diffuse ST changes including TWI in anterolateral leads, anterior Qwaves.  Stable from prior.  Chest Radiograph PA and Lateral 06/11/2016 Cardiomegaly, mild pulmonary vascular congestion.  No PTX, pulmonary edema, consolidation.  Echocardiogram 05/13/2015 EF 25-30% Diffuse hypokinesis Moderately to severely dilated RA Moderate MR  Assessment & Plan by Problem: Principal Problem:   Heart failure (HCC) Active Problems:   Chronic kidney disease  Benign essential HTN   Polysubstance abuse   Dyspnea  60 year old with history of ischemic and non-ischemic cardiomyopathy with systolic HF with acute on chronic exertional dyspnea and hypotension.  He does not appear volume overloaded, and his symptoms may represent worsening cardiac function.  #Dyspnea #CHF Volume status is uncertain, with hypotension, elevated BNP, no JVD, gallop present, lungs clear, legs not edematous.  Denies recent cocaine use, but had UDS positive for cocaine 6 months ago.  He is not grossly volume overloaded, and fluid trial may clarify his volume status.  He has known right atrial enlargement which could lead to dissociation of his BNP level from intravascular volume status. -CXR -Trial 500 mL bolus -Telemetry -Repeat echo -Trend troponins -Hold lasix, BB -Continue Entresto  #CKD Baseline Creatinine ~1.5-1.7, 1.86 today is near baseline. -Hold home lasix -Continue home Entresto  #HTN -Hold home lasix, carvedilol -Continue home Entresto  #HL -Continue home atorvastatin  #BPH -Continue home tamsulosin  #GERD -Continue home ranitidine  Dispo: Admit patient to Inpatient with expected length of stay greater than 2 midnights.  Signed: Minus Liberty, MD 06/11/2016, 7:24 PM  Pager: [phone]

## 2016-06-11 NOTE — Progress Notes (Signed)
Troponin 0.03. Same as previous results

## 2016-06-11 NOTE — ED Notes (Signed)
Pt is aware that urine is still needed

## 2016-06-12 LAB — COMPREHENSIVE METABOLIC PANEL
ALBUMIN: 3.2 g/dL — AB (ref 3.5–5.0)
ALT: 20 U/L (ref 17–63)
AST: 34 U/L (ref 15–41)
Alkaline Phosphatase: 97 U/L (ref 38–126)
Anion gap: 11 (ref 5–15)
BUN: 26 mg/dL — AB (ref 6–20)
CHLORIDE: 107 mmol/L (ref 101–111)
CO2: 24 mmol/L (ref 22–32)
Calcium: 8.9 mg/dL (ref 8.9–10.3)
Creatinine, Ser: 1.87 mg/dL — ABNORMAL HIGH (ref 0.61–1.24)
GFR calc Af Amer: 43 mL/min — ABNORMAL LOW (ref >60)
GFR, EST NON AFRICAN AMERICAN: 37 mL/min — AB (ref >60)
Glucose, Bld: 128 mg/dL — ABNORMAL HIGH (ref 65–99)
POTASSIUM: 3.3 mmol/L — AB (ref 3.5–5.1)
SODIUM: 142 mmol/L (ref 135–145)
Total Bilirubin: 1.8 mg/dL — ABNORMAL HIGH (ref 0.3–1.2)
Total Protein: 5.5 g/dL — ABNORMAL LOW (ref 6.5–8.1)

## 2016-06-12 LAB — CBC
HCT: 41.4 % (ref 39.0–52.0)
Hemoglobin: 13.4 g/dL (ref 13.0–17.0)
MCH: 33.8 pg (ref 26.0–34.0)
MCHC: 32.4 g/dL (ref 30.0–36.0)
MCV: 104.5 fL — ABNORMAL HIGH (ref 78.0–100.0)
PLATELETS: 123 10*3/uL — AB (ref 150–400)
RBC: 3.96 MIL/uL — ABNORMAL LOW (ref 4.22–5.81)
RDW: 14.9 % (ref 11.5–15.5)
WBC: 4.9 10*3/uL (ref 4.0–10.5)

## 2016-06-12 LAB — TROPONIN I: TROPONIN I: 0.03 ng/mL — AB (ref <0.03)

## 2016-06-12 LAB — MRSA PCR SCREENING: MRSA by PCR: POSITIVE — AB

## 2016-06-12 MED ORDER — ASPIRIN 81 MG PO TBEC: 81.0000 mg | DELAYED_RELEASE_TABLET | Freq: Every day | ORAL | 3 refills | Status: DC

## 2016-06-12 MED ORDER — POTASSIUM CHLORIDE CRYS ER 20 MEQ PO TBCR
40.0000 meq | EXTENDED_RELEASE_TABLET | Freq: Once | ORAL | Status: AC
Start: 2016-06-12 — End: 2016-06-12
  Administered 2016-06-12: 40 meq via ORAL
  Filled 2016-06-12: qty 2

## 2016-06-12 MED ORDER — PAROXETINE HCL 20 MG PO TABS: 20.0000 mg | ORAL_TABLET | Freq: Every day | ORAL | 3 refills | Status: DC

## 2016-06-12 MED ORDER — DIPHENHYDRAMINE HCL 25 MG PO CAPS
25.0000 mg | ORAL_CAPSULE | Freq: Once | ORAL | Status: AC
  Administered 2016-06-12: 25 mg via ORAL
  Filled 2016-06-12: qty 1

## 2016-06-12 MED ORDER — ENOXAPARIN SODIUM 40 MG/0.4ML ~~LOC~~ SOLN: 40.0000 mg | SUBCUTANEOUS | Status: DC

## 2016-06-12 MED ORDER — PAROXETINE HCL 20 MG PO TABS
20.0000 mg | ORAL_TABLET | Freq: Every day | ORAL | Status: DC
  Administered 2016-06-12: 20 mg via ORAL
  Filled 2016-06-12: qty 1

## 2016-06-12 NOTE — Progress Notes (Signed)
   Subjective: He reports anxiety overnight, worried about his health and that he might die.  However, he now feels better and less anxious than prior to presentation.  He now says that the main reason he came to the hospital was for his anxiety rather than any change in dyspnea.  Objective:  Vital signs in last 24 hours: Vitals:   06/12/16 0342 06/12/16 0400 06/12/16 0747 06/12/16 1225  BP:   110/81 94/71  Pulse:      Resp:   18 18  Temp: 97.6 F (36.4 C)  98.2 F (36.8 C) (!) 96.9 F (36.1 C)  TempSrc: Oral  Oral Axillary  SpO2:  100% 100%   Weight:      Height:       Physical Exam  Constitutional: He is oriented to person, place, and time. He appears well-developed and well-nourished. No distress.  Neck: No JVD present.  Cardiovascular: Normal rate and regular rhythm.  Exam reveals gallop.   Murmur heard. Pulmonary/Chest: Effort normal and breath sounds normal. He has no rales.  Abdominal: Soft. Bowel sounds are normal.  Neurological: He is alert and oriented to person, place, and time.  Psychiatric:  Tearful   CBC Latest Ref Rng & Units 06/12/2016 06/11/2016 04/30/2016  WBC 4.0 - 10.5 K/uL 4.9 5.1 3.6(L)  Hemoglobin 13.0 - 17.0 g/dL 13.4 14.6 13.5  Hematocrit 39.0 - 52.0 % 41.4 44.8 40.6  Platelets 150 - 400 K/uL 123(L) 122(L) 127(L)   BMP Latest Ref Rng & Units 06/12/2016 06/11/2016 04/30/2016  Glucose 65 - 99 mg/dL 128(H) 98 89  BUN 6 - 20 mg/dL 26(H) 25(H) 20  Creatinine 0.61 - 1.24 mg/dL 1.87(H) 1.86(H) 1.60(H)  Sodium 135 - 145 mmol/L 142 140 140  Potassium 3.5 - 5.1 mmol/L 3.3(L) 4.2 4.0  Chloride 101 - 111 mmol/L 107 109 110  CO2 22 - 32 mmol/L 24 20(L) 21(L)  Calcium 8.9 - 10.3 mg/dL 8.9 9.2 9.1   Cardiac Panel (last 3 results)  Recent Labs  06/11/16 2213 06/12/16 0235  TROPONINI 0.03* 0.03*    Assessment/Plan:  Principal Problem:   Heart failure (HCC) Active Problems:   Chronic kidney disease   Benign essential HTN   Polysubstance abuse  Dyspnea  #Anxiety Endorses daily symptoms of generalized anxiety sometimes accompanied by dyspnea and chest pain.  Anxiety now seems to be the immediate reason for this presentation. -Start paroxetine 20 mg daily  #Dyspnea #CHF Appears euvolemic, and history now sounds as if his breathing is acutely unchanged but perhaps with chronic progression.  Troponins barely detectable and flat trend, no ACS. -Echocardiogram -Hold lasix, BB -Continue Entresto  #CKD Currently at or near baseline. -Hold home lasix -Continue home Entresto  #HTN Normotensive. -Hold home lasix, carvedilol -Continue home Entresto  Dispo: Anticipated discharge today pending echocardiogram.   Minus Liberty, MD 06/12/2016, 12:28 PM Pager: [phone]

## 2016-06-12 NOTE — Discharge Summary (Signed)
Name: [name] MRN: [id number] DOB: 03-Aug-1955 60 y.o. PCP: [name], MD  Date of Admission: 06/11/2016  3:22 PM Date of Discharge: 06/12/2016 Attending Physician: Sid Falcon, MD  Discharge Diagnosis:  Principal Problem:   Heart failure [facility]) Active Problems:   Chronic kidney disease   Benign essential HTN   Polysubstance abuse   Dyspnea   Generalized anxiety disorder   Discharge Medications:   Medication List    TAKE these medications   albuterol 108 (90 Base) MCG/ACT inhaler Commonly known as:  PROVENTIL HFA;VENTOLIN HFA Inhale 1-2 puffs into the lungs every 6 (six) hours as needed for wheezing or shortness of breath.   aspirin 81 MG EC tablet Take 1 tablet (81 mg total) by mouth daily. Start taking on:  06/13/2016   atorvastatin 80 MG tablet Commonly known as:  LIPITOR Take 80 mg by mouth daily.   b complex vitamins tablet Take 1 tablet by mouth daily.   carvedilol 3.125 MG tablet Commonly known as:  COREG Take 1 tablet (3.125 mg total) by mouth 2 (two) times daily with a meal. Discontinue 6.25mg    ENTRESTO 24-26 MG Generic drug:  sacubitril-valsartan Take 1 tablet by mouth 2 (two) times daily.   furosemide 40 MG tablet Commonly known as:  LASIX Take 1 tablet (40 mg total) by mouth 2 (two) times daily. What changed:  how much to take   nitroGLYCERIN 0.4 MG SL tablet Commonly known as:  NITROSTAT Place 1 tablet (0.4 mg total) under the tongue every 5 (five) minutes x 3 doses as needed for chest pain.   oxymetazoline 0.05 % nasal spray Commonly known as:  AFRIN NASAL SPRAY Place 1 spray into both nostrils 2 (two) times daily.   PARoxetine 20 MG tablet Commonly known as:  PAXIL Take 1 tablet (20 mg total) by mouth daily.   potassium chloride SA 20 MEQ tablet Commonly known as:  K-DUR,KLOR-CON Take 20 mEq by mouth daily.   ranitidine 150 MG tablet Commonly known as:  ZANTAC Take 1 tablet (150 mg total) by mouth 2 (two) times  daily.   tamsulosin 0.4 MG Caps capsule Commonly known as:  FLOMAX Take 0.4 mg by mouth daily.   tiZANidine 4 MG tablet Commonly known as:  ZANAFLEX TAKE 1 TABLET BY MOUTH EVERY 8 HOURS AS NEEDED FOR MUSCLE SPASMS.       Disposition and follow-up:   [name] was discharged from [facility] in Stable condition.  At the hospital follow up visit please address:  1.  Heart Failure.  He was clinically euvolemic throughout this admission despite high BNP, and reports chronic worsening exercise capacity and dyspnea.  2.  Anxiety.  He reports frequent anxiety and racing thoughts at night, and thinks that anxiety is what precipitated this admission.  He was discharged on paroxetine 20 mg daily for suspected generalized anxiety disorder.  Please ask about anxiety and depression, and continue or uptitrate SSRI as appropriate.  3. Cocaine abuse.  UDS negative for cocaine this admission, and denies use in past year.  Please encourage continued abstinence for his cardiovascular health.  4.  Labs / imaging needed at time of follow-up: echocardiogram  5.  Pending labs/ test needing follow-up: none  Follow-up Appointments: Follow-up Information    [name], MD. Schedule an appointment as soon as possible for a visit in 1 week(s).   Specialty:  Family Medicine Contact information: [address] [place] [address] [phone]  Hospital Course by problem list: Principal Problem:   Heart failure ([place]) Active Problems:   Chronic kidney disease   Benign essential HTN   Polysubstance abuse   Dyspnea   1. Dyspnea 2. Heart Failure 3. Anxiety [name] reports worsening exertional dyspnea over the past 6 moths which limits his exercise capacity.  He also has occasional episode of chest pain when his is anxious, and sometimes worries about his health.  The acute precipitant of this presentation was initially unclear, but generally reported  worsening dyspnea since the morning of admission.  ACS was ruled out with stable EKG and serial troponins.  His BNP was very elevated, but he appeared clinically euvolemic to mildly hypovolemic with no JVD, no pulmonary edema, and only trace LE edema.  He was given a small bolus of IVF and his lasix and carvedilol held.  On the day after admission he was at his baseline, and stated that his dyspnea is worse with anxiety and that he thinks his anxiety is what brought him to the hospital yesterday.  He tearful endorses daily anxiety, racing thoughts, and worry about his health.  He was was amenable to starting SSRI for anxiety, and discharged with prescription for paroxetine 20 mg daily.  His worsening dyspnea over months may represent progression of his CHF, and he may warrant a repeat echocardiogram.  Discharged on unchanged regimen of entresto, carvedilol, and lasix.  4. HTN His lasix and carvedilol were held, and he was normotensive to slightly hypotensive throughout the admission.  5. CKD His creatinine was consistent with his baseline.  While admitted, his lasix was   Discharge Vitals:   BP 94/71 (BP Location: Right Arm)   Pulse 66   Temp (!) 96.9 F (36.1 C) (Axillary)   Resp 18   Ht 5\' 8"  (1.727 m)   Wt 179 lb 14.3 oz (81.6 kg)   SpO2 100%   BMI 27.35 kg/m   Pertinent Labs, Studies, and Procedures:   CBC Latest Ref Rng & Units 06/12/2016 06/11/2016 04/30/2016  WBC 4.0 - 10.5 K/uL 4.9 5.1 3.6(L)  Hemoglobin 13.0 - 17.0 g/dL 13.4 14.6 13.5  Hematocrit 39.0 - 52.0 % 41.4 44.8 40.6  Platelets 150 - 400 K/uL 123(L) 122(L) 127(L)   CMP Latest Ref Rng & Units 06/12/2016 06/11/2016 04/30/2016  Glucose 65 - 99 mg/dL 128(H) 98 89  BUN 6 - 20 mg/dL 26(H) 25(H) 20  Creatinine 0.61 - 1.24 mg/dL 1.87(H) 1.86(H) 1.60(H)  Sodium 135 - 145 mmol/L 142 140 140  Potassium 3.5 - 5.1 mmol/L 3.3(L) 4.2 4.0  Chloride 101 - 111 mmol/L 107 109 110  CO2 22 - 32 mmol/L 24 20(L) 21(L)  Calcium 8.9 -  10.3 mg/dL 8.9 9.2 9.1  Total Protein 6.5 - 8.1 g/dL 5.5(L) 6.3(L) -  Total Bilirubin 0.3 - 1.2 mg/dL 1.8(H) 2.3(H) -  Alkaline Phos 38 - 126 U/L 97 112 -  AST 15 - 41 U/L 34 52(H) -  ALT 17 - 63 U/L 20 27 -   Cardiac Panel (last 3 results)  Recent Labs  06/11/16 2213 06/12/16 0235  TROPONINI 0.03* 0.03*    BNP    Component Value Date/Time   BNP 4,425.2 (H) 06/11/2016 1604   BNP 260.2 (H) 10/14/2015 0929    Drugs of Abuse     Component Value Date/Time   LABOPIA NONE DETECTED 06/11/2016 1953   COCAINSCRNUR NONE DETECTED 06/11/2016 1953   LABBENZ NONE DETECTED 06/11/2016 1953   AMPHETMU NONE DETECTED 06/11/2016  [place] DETECTED 06/11/2016 [place] NONE DETECTED 06/11/2016 1953     Discharge Instructions: Discharge Instructions    Diet - low sodium heart healthy    Complete by:  As directed    Increase activity slowly    Complete by:  As directed      You were admitted to the hospital because of shortness of breath.  I do not think the new shortness of breath yesterday is because of your heart or lungs.  You may have some shortness of breath from your heart and lungs regularly, but I think it was anxiety that made things worse yesterday.  I have prescribed you a new medicine, Paxil (Paroxetine), for anxiety.  Take it every day, not just when you are feeling anxious.  Please call your PCP to schedule an appointment in 1-2 weeks.  If you have chest pain or worse shortness of breath, please call 911 or go to the Emergency Department as these could be signs of something dangerous.  Signed: Minus Liberty, MD 06/12/2016, 1:58 PM   Pager: [phone]

## 2016-06-12 NOTE — Discharge Summary (Signed)
Patient discharged [place] [facility] home with brother and all belongings. Discharge information given and all questions answered.

## 2016-06-12 NOTE — Discharge Instructions (Addendum)
You were admitted to the hospital because of shortness of breath.  I do not think the new shortness of breath yesterday is because of your heart or lungs.  You may have some shortness of breath from your heart and lungs regularly, but I think it was anxiety that made things worse yesterday.  I have prescribed you a new medicine, Paxil (Paroxetine), for anxiety.  Take it every day, not just when you are feeling anxious.  Please call your PCP to schedule an appointment in 1-2 weeks.  If you have chest pain or worse shortness of breath, please call 911 or go to the Emergency Department as these could be signs of something dangerous.    Shortness of Breath Shortness of breath means you have trouble breathing. Shortness of breath needs medical care right away. HOME CARE   Do not smoke.  Avoid being around chemicals or things (paint fumes, dust) that may bother your breathing.  Rest as needed. Slowly begin your normal activities.  Only take medicines as told by your doctor.  Keep all doctor visits as told. GET HELP RIGHT AWAY IF:   Your shortness of breath gets worse.  You feel lightheaded, pass out (faint), or have a cough that is not helped by medicine.  You cough up blood.  You have pain with breathing.  You have pain in your chest, arms, shoulders, or belly (abdomen).  You have a fever.  You cannot walk up stairs or exercise the way you normally do.  You do not get better in the time expected.  You have a hard time doing normal activities even with rest.  You have problems with your medicines.  You have any new symptoms. MAKE SURE YOU:  Understand these instructions.  Will watch your condition.  Will get help right away if you are not doing well or get worse. This information is not intended to replace advice given to you by your health care provider. Make sure you discuss any questions you have with your health care provider. Document Released: 12/21/2007 Document  Revised: 07/09/2013 Document Reviewed: 09/19/2011 Elsevier Interactive Patient Education  2017 [employer].

## 2016-06-13 DIAGNOSIS — F411 Generalized anxiety disorder: Secondary | ICD-10-CM

## 2016-06-14 ENCOUNTER — Encounter (HOSPITAL_COMMUNITY): Payer: Self-pay | Admitting: *Deleted

## 2016-06-14 ENCOUNTER — Inpatient Hospital Stay (HOSPITAL_COMMUNITY)
Admission: EM | Admit: 2016-06-14 | Discharge: 2016-06-16 | DRG: 312 | Disposition: A | Discharge status: Home / Self Care | Payer: Medicaid Other | Attending: Student in an Organized Health Care Education/Training Program | Admitting: Student in an Organized Health Care Education/Training Program

## 2016-06-14 DIAGNOSIS — I420 Dilated cardiomyopathy: Secondary | ICD-10-CM | POA: Diagnosis not present

## 2016-06-14 DIAGNOSIS — I5042 Chronic combined systolic (congestive) and diastolic (congestive) heart failure: Secondary | ICD-10-CM | POA: Diagnosis present

## 2016-06-14 DIAGNOSIS — N183 Chronic kidney disease, stage 3 unspecified: Secondary | ICD-10-CM | POA: Diagnosis present

## 2016-06-14 DIAGNOSIS — N184 Chronic kidney disease, stage 4 (severe): Secondary | ICD-10-CM | POA: Diagnosis present

## 2016-06-14 DIAGNOSIS — I252 Old myocardial infarction: Secondary | ICD-10-CM

## 2016-06-14 DIAGNOSIS — Z87891 Personal history of nicotine dependence: Secondary | ICD-10-CM

## 2016-06-14 DIAGNOSIS — E7800 Pure hypercholesterolemia, unspecified: Secondary | ICD-10-CM | POA: Diagnosis present

## 2016-06-14 DIAGNOSIS — M109 Gout, unspecified: Secondary | ICD-10-CM | POA: Diagnosis present

## 2016-06-14 DIAGNOSIS — Z79899 Other long term (current) drug therapy: Secondary | ICD-10-CM

## 2016-06-14 DIAGNOSIS — I951 Orthostatic hypotension: Principal | ICD-10-CM | POA: Diagnosis present

## 2016-06-14 DIAGNOSIS — E1122 Type 2 diabetes mellitus with diabetic chronic kidney disease: Secondary | ICD-10-CM

## 2016-06-14 DIAGNOSIS — Z7982 Long term (current) use of aspirin: Secondary | ICD-10-CM

## 2016-06-14 DIAGNOSIS — I959 Hypotension, unspecified: Secondary | ICD-10-CM | POA: Diagnosis present

## 2016-06-14 DIAGNOSIS — E875 Hyperkalemia: Secondary | ICD-10-CM

## 2016-06-14 DIAGNOSIS — Z9119 Patient's noncompliance with other medical treatment and regimen: Secondary | ICD-10-CM

## 2016-06-14 DIAGNOSIS — E785 Hyperlipidemia, unspecified: Secondary | ICD-10-CM | POA: Diagnosis present

## 2016-06-14 DIAGNOSIS — F1421 Cocaine dependence, in remission: Secondary | ICD-10-CM

## 2016-06-14 DIAGNOSIS — I130 Hypertensive heart and chronic kidney disease with heart failure and stage 1 through stage 4 chronic kidney disease, or unspecified chronic kidney disease: Secondary | ICD-10-CM

## 2016-06-14 DIAGNOSIS — T465X5A Adverse effect of other antihypertensive drugs, initial encounter: Secondary | ICD-10-CM | POA: Diagnosis present

## 2016-06-14 DIAGNOSIS — F411 Generalized anxiety disorder: Secondary | ICD-10-CM | POA: Diagnosis present

## 2016-06-14 DIAGNOSIS — K219 Gastro-esophageal reflux disease without esophagitis: Secondary | ICD-10-CM | POA: Diagnosis present

## 2016-06-14 LAB — CBC WITH DIFFERENTIAL/PLATELET
BASOS ABS: 0.1 10*3/uL (ref 0.0–0.1)
Basophils Relative: 2 %
EOS PCT: 9 %
Eosinophils Absolute: 0.4 10*3/uL (ref 0.0–0.7)
HEMATOCRIT: 42 % (ref 39.0–52.0)
Hemoglobin: 13.6 g/dL (ref 13.0–17.0)
LYMPHS ABS: 1.5 10*3/uL (ref 0.7–4.0)
LYMPHS PCT: 32 %
MCH: 33.8 pg (ref 26.0–34.0)
MCHC: 32.4 g/dL (ref 30.0–36.0)
MCV: 104.5 fL — AB (ref 78.0–100.0)
MONO ABS: 0.6 10*3/uL (ref 0.1–1.0)
Monocytes Relative: 13 %
NEUTROS ABS: 2 10*3/uL (ref 1.7–7.7)
Neutrophils Relative %: 44 %
PLATELETS: 167 10*3/uL (ref 150–400)
RBC: 4.02 MIL/uL — AB (ref 4.22–5.81)
RDW: 15.3 % (ref 11.5–15.5)
WBC: 4.6 10*3/uL (ref 4.0–10.5)

## 2016-06-14 LAB — COMPREHENSIVE METABOLIC PANEL
ALK PHOS: 102 U/L (ref 38–126)
ALT: 25 U/L (ref 17–63)
AST: 37 U/L (ref 15–41)
Albumin: 3.5 g/dL (ref 3.5–5.0)
Anion gap: 9 (ref 5–15)
BILIRUBIN TOTAL: 2.5 mg/dL — AB (ref 0.3–1.2)
BUN: 22 mg/dL — AB (ref 6–20)
CALCIUM: 9.6 mg/dL (ref 8.9–10.3)
CO2: 23 mmol/L (ref 22–32)
CREATININE: 1.79 mg/dL — AB (ref 0.61–1.24)
Chloride: 111 mmol/L (ref 101–111)
GFR, EST AFRICAN AMERICAN: 45 mL/min — AB (ref >60)
GFR, EST NON AFRICAN AMERICAN: 39 mL/min — AB (ref >60)
Glucose, Bld: 101 mg/dL — ABNORMAL HIGH (ref 65–99)
Potassium: 4.1 mmol/L (ref 3.5–5.1)
Sodium: 143 mmol/L (ref 135–145)
TOTAL PROTEIN: 5.8 g/dL — AB (ref 6.5–8.1)

## 2016-06-14 LAB — I-STAT CHEM 8, ED
BUN: 39 mg/dL — ABNORMAL HIGH (ref 6–20)
CHLORIDE: 109 mmol/L (ref 101–111)
CREATININE: 1.8 mg/dL — AB (ref 0.61–1.24)
Calcium, Ion: 1.11 mmol/L — ABNORMAL LOW (ref 1.15–1.40)
GLUCOSE: 86 mg/dL (ref 65–99)
HCT: 43 % (ref 39.0–52.0)
HEMOGLOBIN: 14.6 g/dL (ref 13.0–17.0)
POTASSIUM: 6.3 mmol/L — AB (ref 3.5–5.1)
Sodium: 141 mmol/L (ref 135–145)
TCO2: 25 mmol/L (ref 0–100)

## 2016-06-14 LAB — I-STAT TROPONIN, ED: Troponin i, poc: 0.03 ng/mL (ref 0.00–0.08)

## 2016-06-14 MED ORDER — FAMOTIDINE 20 MG PO TABS
20.0000 mg | ORAL_TABLET | Freq: Two times a day (BID) | ORAL | Status: DC
  Administered 2016-06-15 – 2016-06-16 (×3): 20 mg via ORAL
  Filled 2016-06-14 (×3): qty 1

## 2016-06-14 MED ORDER — ENOXAPARIN SODIUM 40 MG/0.4ML ~~LOC~~ SOLN
40.0000 mg | Freq: Every day | SUBCUTANEOUS | Status: DC
  Administered 2016-06-15: 40 mg via SUBCUTANEOUS
  Filled 2016-06-14 (×2): qty 0.4

## 2016-06-14 MED ORDER — SODIUM CHLORIDE 0.9% FLUSH
3.0000 mL | Freq: Two times a day (BID) | INTRAVENOUS | Status: DC
  Administered 2016-06-15 (×2): 3 mL via INTRAVENOUS

## 2016-06-14 MED ORDER — SODIUM CHLORIDE 0.9 % IV BOLUS (SEPSIS)
250.0000 mL | Freq: Once | INTRAVENOUS | Status: AC
  Administered 2016-06-14: 250 mL via INTRAVENOUS

## 2016-06-14 MED ORDER — ATORVASTATIN CALCIUM 80 MG PO TABS
80.0000 mg | ORAL_TABLET | Freq: Every day | ORAL | Status: DC
  Administered 2016-06-15 – 2016-06-16 (×2): 80 mg via ORAL
  Filled 2016-06-14 (×2): qty 1

## 2016-06-14 MED ORDER — CALCIUM GLUCONATE 10 % IV SOLN
1.0000 g | INTRAVENOUS | Status: AC
  Administered 2016-06-14: 1 g via INTRAVENOUS
  Filled 2016-06-14: qty 10

## 2016-06-14 MED ORDER — ALLOPURINOL 300 MG PO TABS
300.0000 mg | ORAL_TABLET | Freq: Every day | ORAL | Status: DC
  Administered 2016-06-15 – 2016-06-16 (×2): 300 mg via ORAL
  Filled 2016-06-14: qty 1
  Filled 2016-06-14: qty 3

## 2016-06-14 MED ORDER — ALBUTEROL SULFATE (2.5 MG/3ML) 0.083% IN NEBU: 2.5000 mg | INHALATION_SOLUTION | Freq: Four times a day (QID) | RESPIRATORY_TRACT | Status: DC | PRN

## 2016-06-14 MED ORDER — ALBUTEROL SULFATE HFA 108 (90 BASE) MCG/ACT IN AERS: 1.0000 | INHALATION_SPRAY | Freq: Four times a day (QID) | RESPIRATORY_TRACT | Status: DC | PRN

## 2016-06-14 MED ORDER — ASPIRIN EC 81 MG PO TBEC
81.0000 mg | DELAYED_RELEASE_TABLET | Freq: Every day | ORAL | Status: DC
Start: 2016-06-15 — End: 2016-06-16
  Administered 2016-06-15 – 2016-06-16 (×2): 81 mg via ORAL
  Filled 2016-06-14 (×2): qty 1

## 2016-06-14 MED ORDER — PAROXETINE HCL 20 MG PO TABS
20.0000 mg | ORAL_TABLET | Freq: Every day | ORAL | Status: DC
  Administered 2016-06-15 – 2016-06-16 (×2): 20 mg via ORAL
  Filled 2016-06-14 (×2): qty 1

## 2016-06-14 NOTE — H&P (Signed)
Date: 06/14/2016               Patient Name:  [name] MRN: [id number]  DOB: April 15, 1956 Age / Sex: 60 y.o., male   PCP: [name], MD         Medical Service: Internal Medicine Teaching Service         Attending Physician: Dr. Tanna Furry, MD    First Contact: Dr. Inda Castle Pager: [phone]  Second Contact: Dr. Benjamine Mola Pager: [phone]       After Hours (After 5p/  First Contact Pager: [phone]  weekends / holidays): Second Contact Pager: [phone]   Chief Complaint: feeling jittery after taking medications  History of Present Illness: Mr. [name] is a 60 y.o. man with PMHx significant for non-ischemic dilated cardiomyopathy (LV EF 25-30% in Oct 2016), history of NSTEMI in June 2016 that was in the setting of cocaine use with recent patent coronary arteries at that time, HTN, diet controlled DM, history of cocaine use, chronic kidney disease stage 3, anxiety, and recent hospital admission over the Thanksgiving holiday weekend for acute on chronic dyspnea with exertion.  Mr. Recupero is rather vague and inconsistent as an informant making the history more difficult to obtain.    He was recently hospitalized under the care of [facility] from 11/25-11/26 with complaint of worsening exertional dyspnea over several months.  ACS was ruled out with stable EKG and serial troponin measurements.  His BNP was found to be significantly elevated, however, he appeared euvolemic, if not mildly hypovolemic, with no evidence of JVD, pulmonary edema, and only trace pedal edema.  He was challenged with small boluses of IV fluids and his Lasix and Coreg were held.  During this admission, it was felt that anxiety was a driving factor in his dyspnea with patient reporting daily anxiety, racing thoughts, and worry over his health.  He was to start on paroxetine 20mg  daily after discharge, but has done so as of yet.  Repeat Echo was not obtained during recent admission and he was discharged on his home dosing  of Entresto, Coreg, and Lasix for his HFrEF.   This morning, Mr. Mankey reports taking his medications and being in his regular state of health.  Shortly after taking medications, he reported feeling jittery and called EMS.  He thought it may be related to his atorvastatin and drinking grapefruit juice simultaneously.  EMS found him to be hypotensive.  He reportedly felt dizzy and lightheaded but did not have any syncopal event.  Denies nausea, vomiting, chest pain.  Reported feeling SOB during this episode but this has since resolved.  He otherwise reports normal bowel movements, denies dysuria but reports his stream being off and "having bad aim" due to being uncircumsized.  Otherwise, 10-point ROS negative.  In the ED, patient initially hypotensive with BP 84/62, HR 61, RR 16, saturating 99% on ambient air.  Received 2 boluses of 255mL each with improvement in BP.  On my exam SBP was 105 with MAP 83.  Further initial labs included normal CBC, I-stat troponin 0.03, I-stat Chemistry with potassium of 6.3 with suspected hemolysis.  Patient given calcium gluconate.  Follow up CMET revealed Na 143, K 4.1, Cl 111, CO2 23, glucose 101, stable CKD with creatinine 1.79. Total bilirubin was found to be 2.5 relatively stable from previous.  IMTS was asked to admit to observation due to hypotension.  Meds:  Current Meds  Medication Sig  . albuterol (PROVENTIL HFA;VENTOLIN HFA) 108 (90  Base) MCG/ACT inhaler Inhale 1-2 puffs into the lungs every 6 (six) hours as needed for wheezing or shortness of breath.  . allopurinol (ZYLOPRIM) 300 MG tablet Take 300 mg by mouth daily.  Marland Kitchen aspirin EC 81 MG EC tablet Take 1 tablet (81 mg total) by mouth daily.  Marland Kitchen atorvastatin (LIPITOR) 80 MG tablet Take 80 mg by mouth daily.  . carvedilol (COREG) 3.125 MG tablet Take 1 tablet (3.125 mg total) by mouth 2 (two) times daily with a meal. Discontinue 6.25mg   . clopidogrel (PLAVIX) 75 MG tablet Take 75 mg by mouth daily.  .  furosemide (LASIX) 40 MG tablet Take 1 tablet (40 mg total) by mouth 2 (two) times daily. (Patient taking differently: Take 80 mg by mouth 2 (two) times daily. )  . nitroGLYCERIN (NITROSTAT) 0.4 MG SL tablet Place 1 tablet (0.4 mg total) under the tongue every 5 (five) minutes x 3 doses as needed for chest pain.  . potassium chloride SA (K-DUR,KLOR-CON) 20 MEQ tablet Take 20 mEq by mouth daily.  . ranitidine (ZANTAC) 150 MG tablet Take 1 tablet (150 mg total) by mouth 2 (two) times daily.  . sacubitril-valsartan (ENTRESTO) 24-26 MG Take 1 tablet by mouth 2 (two) times daily.  Marland Kitchen tiZANidine (ZANAFLEX) 4 MG tablet TAKE 1 TABLET BY MOUTH EVERY 8 HOURS AS NEEDED FOR MUSCLE SPASMS.     Allergies: Allergies as of 06/14/2016  . (No Known Allergies)   Past Medical History:  Diagnosis Date  . Anxiety   . Arthritis    "all over" (01/07/2015)  . CKD (chronic kidney disease), stage IV ([place])   . Depression   . Headache    "probably weekly" (01/07/2015)  . Heart attack 07/2014  . History of echocardiogram 12/2014   EF 20-25%  . Homelessness   . Hypercholesterolemia   . Hypertension   . Ischemic dilated cardiomyopathy ([place])   . Noncompliance   . NSTEMI (non-ST elevated myocardial infarction) ([place]) 01/07/2015  . Polysubstance abuse    etoh, cocaine  . Urinary hesitancy   . Walking pneumonia 07/2014    Family History:  Parents with possible hx of DM.  Unable to obtain further family history.  Social History:  Denies recent cocaine use, drinks EtOH socially, former tobacco use with approx 30 year pack history  Review of Systems: A complete ROS was negative except as per HPI.   Physical Exam: Blood pressure 100/77, pulse (!) 59, temperature 97.3 F (36.3 C), temperature source Oral, resp. rate 18, SpO2 (!) 89 %. General: Vital signs reviewed.  Patient is a well-developed and well-nourished, in no acute distress and cooperative with exam.  Head: Normocephalic and atraumatic. Eyes: EOMI,  conjunctivae normal. Neck: No JVD. Cardiovascular: RRR, 2/6 systolic murmur. Pulmonary/Chest: CTAB, no distress, good air movement, no wheezes or crackles. Abdominal: Soft, non-tender, non-distended. Extremities: Trace pedal edema bilaterally.  Extremities warm and well perfused. Neurological: Alert and oriented x 3, no focal deficits. Skin: Warm, dry and intact. No rashes or erythema. Psychiatric: Normal mood and affect. Not tearful or anxious.  EKG: Sinus rhythm, Prolonged PR interval, Right bundle branch block seen on previous tracings.  Assessment & Plan by Problem: Principal Problem:   Hypotensive episode Active Problems:   Chronic kidney disease   Chronic combined systolic and diastolic congestive heart failure (HCC)   Generalized anxiety disorder  Hypotension - likely secondary to his home medication and may need titration of these medication at hospital discharge.  Also with hospitalization in October 2017 for  dehydration.  Currently, he is perfusing well and has responded to about 520mL of normal saline in the ED with BP coming up to systolic greater than 384 with adequate MAP.  Currently asymptomatic and feeling of jitteriness has resolved.  Do not suspect hypoglycemia given normal glucose on blood work here and patient not on any hypoglycemic medications. - hold home BP medications this evening and add back as tolerated - admit to telemetry - monitor hemodynamics, can bolus as needed for hypotension  Hyperkalemia - 6.3 on I-stat chemistry.  Given calcium in the ED.  Repeat measurement normal.  Suspect hemolysis. - continue to monitor - hold home potassium supplements   HFrEF - volume status appears stable (if not hypovolemic) without evidence of being volume overloaded presently.  Echo last year with LV EF 25-30%.  Home medications are Lasix 40mg  BID, Coreg 3.125mg  BID, and Entresto.  There is concern for some confusion and/or non-adherence regarding his home medications as he  had a bottle of Lasix 80mg  BID that he reports taking and his bottle of Coreg is from January 16, 2016 for a 30-day supply with no refills.   - consider repeat Echo vs obtaining as outpatient - holding home medications in setting of hypotension and possible volume depletion - daily weights, I/O's  Anxiety - patient did not pick up prescription for paroxetine but was agreeable to starting this medication  - start paroxetine 20mg  daily  CKD - stable with creatinine of 1.79 - avoid nephrotoxic medications  HTN - currently hypotensive and holding home meds  Hyperlipidemia - last lipid panel with LDL 56, HDL 24, and TC 95 (Feb 2016) - continue home atorvastatin  GERD - on ranitidine at home - continue H2 blocker with famotidine  Hx NSTEMI - occurred in June of 2016 in the setting of cocaine use.  Denies recent cocaine use.  Patent coronary arteries recently noted by Dr. Terrence Dupont at time of this NSTEMI and patient elected for medical management versus invasive testing.  Chest pain resolved that admission.  He is currently chest pain free and do not suspect ACS with negative point of care troponin and unchanged EKG. - continue home aspirin.  Patient brought bottle of Plavix with him as well and reports taking both.  Do not see indication for DAPT so will hold Plavix.  Patient may benefit from pharmacist education at time of discharge to go over his home medications.  Dispo: Admit patient to Observation with expected length of stay less than 2 midnights.  Signed: Jule Ser, DO 06/14/2016, 10:04 PM  Pager: [phone]

## 2016-06-14 NOTE — ED Triage Notes (Signed)
Per EMS- pt called out for "jittery" pt drank grapefruit juice with his atorvastatin and reported feeling this way. Hx of anxiety and not taking medication.

## 2016-06-15 DIAGNOSIS — I5042 Chronic combined systolic (congestive) and diastolic (congestive) heart failure: Secondary | ICD-10-CM | POA: Diagnosis present

## 2016-06-15 DIAGNOSIS — M109 Gout, unspecified: Secondary | ICD-10-CM | POA: Diagnosis present

## 2016-06-15 DIAGNOSIS — E1122 Type 2 diabetes mellitus with diabetic chronic kidney disease: Secondary | ICD-10-CM | POA: Diagnosis present

## 2016-06-15 DIAGNOSIS — Z7982 Long term (current) use of aspirin: Secondary | ICD-10-CM | POA: Diagnosis not present

## 2016-06-15 DIAGNOSIS — Z79899 Other long term (current) drug therapy: Secondary | ICD-10-CM | POA: Diagnosis not present

## 2016-06-15 DIAGNOSIS — I959 Hypotension, unspecified: Secondary | ICD-10-CM

## 2016-06-15 DIAGNOSIS — N184 Chronic kidney disease, stage 4 (severe): Secondary | ICD-10-CM | POA: Diagnosis present

## 2016-06-15 DIAGNOSIS — Z9119 Patient's noncompliance with other medical treatment and regimen: Secondary | ICD-10-CM | POA: Diagnosis not present

## 2016-06-15 DIAGNOSIS — F419 Anxiety disorder, unspecified: Secondary | ICD-10-CM | POA: Diagnosis not present

## 2016-06-15 DIAGNOSIS — F411 Generalized anxiety disorder: Secondary | ICD-10-CM | POA: Diagnosis present

## 2016-06-15 DIAGNOSIS — K219 Gastro-esophageal reflux disease without esophagitis: Secondary | ICD-10-CM | POA: Diagnosis present

## 2016-06-15 DIAGNOSIS — E875 Hyperkalemia: Secondary | ICD-10-CM | POA: Diagnosis present

## 2016-06-15 DIAGNOSIS — I951 Orthostatic hypotension: Secondary | ICD-10-CM | POA: Diagnosis not present

## 2016-06-15 DIAGNOSIS — T465X5A Adverse effect of other antihypertensive drugs, initial encounter: Secondary | ICD-10-CM | POA: Diagnosis present

## 2016-06-15 DIAGNOSIS — R55 Syncope and collapse: Secondary | ICD-10-CM | POA: Diagnosis present

## 2016-06-15 DIAGNOSIS — I130 Hypertensive heart and chronic kidney disease with heart failure and stage 1 through stage 4 chronic kidney disease, or unspecified chronic kidney disease: Secondary | ICD-10-CM | POA: Diagnosis present

## 2016-06-15 DIAGNOSIS — I252 Old myocardial infarction: Secondary | ICD-10-CM | POA: Diagnosis not present

## 2016-06-15 DIAGNOSIS — I420 Dilated cardiomyopathy: Secondary | ICD-10-CM | POA: Diagnosis present

## 2016-06-15 DIAGNOSIS — E785 Hyperlipidemia, unspecified: Secondary | ICD-10-CM | POA: Diagnosis present

## 2016-06-15 DIAGNOSIS — E7800 Pure hypercholesterolemia, unspecified: Secondary | ICD-10-CM | POA: Diagnosis present

## 2016-06-15 LAB — CBC
HEMATOCRIT: 42.3 % (ref 39.0–52.0)
HEMOGLOBIN: 14 g/dL (ref 13.0–17.0)
MCH: 34.7 pg — ABNORMAL HIGH (ref 26.0–34.0)
MCHC: 33.1 g/dL (ref 30.0–36.0)
MCV: 104.7 fL — AB (ref 78.0–100.0)
Platelets: 108 10*3/uL — ABNORMAL LOW (ref 150–400)
RBC: 4.04 MIL/uL — ABNORMAL LOW (ref 4.22–5.81)
RDW: 15.1 % (ref 11.5–15.5)
WBC: 4.1 10*3/uL (ref 4.0–10.5)

## 2016-06-15 LAB — BASIC METABOLIC PANEL
ANION GAP: 10 (ref 5–15)
BUN: 23 mg/dL — ABNORMAL HIGH (ref 6–20)
CHLORIDE: 111 mmol/L (ref 101–111)
CO2: 21 mmol/L — AB (ref 22–32)
Calcium: 9.4 mg/dL (ref 8.9–10.3)
Creatinine, Ser: 1.93 mg/dL — ABNORMAL HIGH (ref 0.61–1.24)
GFR calc Af Amer: 41 mL/min — ABNORMAL LOW (ref >60)
GFR, EST NON AFRICAN AMERICAN: 36 mL/min — AB (ref >60)
GLUCOSE: 113 mg/dL — AB (ref 65–99)
POTASSIUM: 4 mmol/L (ref 3.5–5.1)
Sodium: 142 mmol/L (ref 135–145)

## 2016-06-15 MED ORDER — ENOXAPARIN SODIUM 40 MG/0.4ML ~~LOC~~ SOLN: 40.0000 mg | Freq: Every day | SUBCUTANEOUS | Status: DC

## 2016-06-15 NOTE — ED Notes (Signed)
ADMITTING DRS INTO SEE PT,  Pt given meds and his was questioned was answered about taking his lisinopril and coreg with grapefruit juice

## 2016-06-15 NOTE — ED Notes (Signed)
Heart Healthy Diet was ordered for Lunch. 

## 2016-06-15 NOTE — ED Notes (Signed)
Pt requested to have o2 put on which I did for him, stating he has to cough alot

## 2016-06-15 NOTE — Progress Notes (Signed)
Subjective: [name] is feeling somewhat better, with improved dizziness and malaise.  However, he does report some mild dyspnea.  I continued to have difficulties compiling a coherent sequence of events leading to this admission, but it now sounds like he may have syncopized.  He said he was lying down sleeping on the couch and when he tried to stand up he "blacked out and went down to the floor".  He had his meds with him, and has 80 mg Lasix pills instead of the 40 mg dose currently shown in EHR.   He reports a visit visit with his cardiologist Dr Charolette Forward and thinks some medicines were changed, but he was not sure what.  MS3 [place] spoke to Dr Terrence Dupont on the phone, who reported increasing Lasix from 40 BID to 80 BID 2 weeks for worsening LE edema.  Weights have been stable in 170s since at least 09/2015, but had documented weight in clinic as low as 150s in 2016.  He is extremely concerned about grapefruit juice interactions with his medications and thinks that this may have precipitated his symptoms.  However, he cannot clearly say when he drinks grapefruit juice or its relationship in time to taking his meds.  Objective:  Vital signs in last 24 hours: Vitals:   06/15/16 0500 06/15/16 0530 06/15/16 0604 06/15/16 0700  BP: 114/97 96/76 104/82 108/90  Pulse: 71 73 70   Resp: 20 20 25 23   Temp:      TempSrc:      SpO2: 100% 96% 99%    Physical Exam  Constitutional: He is oriented to person, place, and time. He appears well-developed and well-nourished. No distress.  Neck: No JVD present.  Cardiovascular: Normal rate and regular rhythm.  Exam reveals gallop.   Pulmonary/Chest: Effort normal and breath sounds normal. He has no rales.  Abdominal: Soft. He exhibits no distension. There is no tenderness.  Musculoskeletal: He exhibits no edema.  Neurological: He is alert and oriented to person, place, and time.  Psychiatric: He has a normal mood and affect. His behavior is  normal.   CBC Latest Ref Rng & Units 06/15/2016 06/14/2016 06/14/2016  WBC 4.0 - 10.5 K/uL 4.1 - 4.6  Hemoglobin 13.0 - 17.0 g/dL 14.0 14.6 13.6  Hematocrit 39.0 - 52.0 % 42.3 43.0 42.0  Platelets 150 - 400 K/uL 108(L) - 167   BMP Latest Ref Rng & Units 06/15/2016 06/14/2016 06/14/2016  Glucose 65 - 99 mg/dL 113(H) 101(H) 86  BUN 6 - 20 mg/dL 23(H) 22(H) 39(H)  Creatinine 0.61 - 1.24 mg/dL 1.93(H) 1.79(H) 1.80(H)  Sodium 135 - 145 mmol/L 142 143 141  Potassium 3.5 - 5.1 mmol/L 4.0 4.1 6.3(HH)  Chloride 101 - 111 mmol/L 111 111 109  CO2 22 - 32 mmol/L 21(L) 23 -  Calcium 8.9 - 10.3 mg/dL 9.4 9.6 -   BNP (last 3 results)  Recent Labs  10/14/15 0929 12/08/15 1445 06/11/16 1604  BNP 260.2* 1,498.8* 4,425.2*     Assessment/Plan:  Principal Problem:   Hypotensive episode Active Problems:   Chronic kidney disease   Chronic combined systolic and diastolic congestive heart failure (HCC)   Generalized anxiety disorder   Hypotension  60 year old man with CHF and poor EF with chronic worsening dyspnea and subacute to acute symptomatic hypotension. He recently had his home diuretic regimen increased due to peripheral edema, and overdiuresis may explain his more recent symptoms.  #Hypotension #Syncope May need reduction in his diuretic and  antihypertensive regimen. -Hold home lasix, entresto, carvedilol, tizanidine -Orthostatic VS -Refrain from further IVF unless unstable  #CHF Last echo 10/26, EF 25-30% with diffuse hypokinesis and dilated R atrium.   Clinically, he is not volume overloaded despite his elevated BNP.  Delene Loll has been associated with increased BNP levels independent of volume status, though at the population level the magnitude of this increase in small. -Hold home lasix, entresto, carvedilol -Repeat echocardiogram  #Anxiety -Continue paroxetine 20 mg daily  Dispo: Anticipated discharge in approximately 1-2 day(s).   Minus Liberty, MD 06/15/2016,  7:15 AM Pager: [phone]

## 2016-06-16 ENCOUNTER — Inpatient Hospital Stay (HOSPITAL_COMMUNITY): Payer: Medicaid Other

## 2016-06-16 DIAGNOSIS — I5042 Chronic combined systolic (congestive) and diastolic (congestive) heart failure: Secondary | ICD-10-CM

## 2016-06-16 DIAGNOSIS — I951 Orthostatic hypotension: Principal | ICD-10-CM

## 2016-06-16 LAB — ECHOCARDIOGRAM COMPLETE
Height: 68 in
Weight: 2936 oz

## 2016-06-16 LAB — BASIC METABOLIC PANEL
Anion gap: 9 (ref 5–15)
BUN: 21 mg/dL — AB (ref 6–20)
CHLORIDE: 109 mmol/L (ref 101–111)
CO2: 21 mmol/L — ABNORMAL LOW (ref 22–32)
Calcium: 9 mg/dL (ref 8.9–10.3)
Creatinine, Ser: 1.81 mg/dL — ABNORMAL HIGH (ref 0.61–1.24)
GFR calc Af Amer: 45 mL/min — ABNORMAL LOW (ref >60)
GFR calc non Af Amer: 38 mL/min — ABNORMAL LOW (ref >60)
GLUCOSE: 106 mg/dL — AB (ref 65–99)
Potassium: 3.8 mmol/L (ref 3.5–5.1)
Sodium: 139 mmol/L (ref 135–145)

## 2016-06-16 NOTE — Progress Notes (Signed)
Subjective: No overnight events. Doing well today. Reports some occasional dyspnea.  Objective: Vital signs in last 24 hours: Temp:  [97.5 F (36.4 C)-98.4 F (36.9 C)] 97.7 F (36.5 C) (11/30 0541) Pulse Rate:  [91-102] 94 (11/30 0541) Resp:  [14] 14 (11/29 1530) BP: (100-122)/(64-93) 117/93 (11/30 1000) SpO2:  [99 %-100 %] 100 % (11/30 0541) Weight:  [83.2 kg (183 lb 8 oz)-84.1 kg (185 lb 8 oz)] 83.2 kg (183 lb 8 oz) (11/30 0541)  Intake/Output Summary (Last 24 hours) at 06/16/16 1353 Last data filed at 06/15/16 1813  Gross per 24 hour  Intake              240 ml  Output                0 ml  Net              240 ml   Physical Exam: General: Well appearing, no acute distress HEENT: moist mucous membranes CV: regular rate and rhythm, no JVD Pulm: normal work of breathing, lung clear to auscultation bilaterally Ext: pulses 2+ bilaterally, warm, no edema  Lab Results: Results for orders placed or performed during the hospital encounter of 06/14/16 (from the past 48 hour(s))  CBC with Differential/Platelet     Status: Abnormal   Collection Time: 06/14/16  4:52 PM  Result Value Ref Range   WBC 4.6 4.0 - 10.5 K/uL   RBC 4.02 (L) 4.22 - 5.81 MIL/uL   Hemoglobin 13.6 13.0 - 17.0 g/dL   HCT 42.0 39.0 - 52.0 %   MCV 104.5 (H) 78.0 - 100.0 fL   MCH 33.8 26.0 - 34.0 pg   MCHC 32.4 30.0 - 36.0 g/dL   RDW 15.3 11.5 - 15.5 %   Platelets 167 150 - 400 K/uL   Neutrophils Relative % 44 %   Neutro Abs 2.0 1.7 - 7.7 K/uL   Lymphocytes Relative 32 %   Lymphs Abs 1.5 0.7 - 4.0 K/uL   Monocytes Relative 13 %   Monocytes Absolute 0.6 0.1 - 1.0 K/uL   Eosinophils Relative 9 %   Eosinophils Absolute 0.4 0.0 - 0.7 K/uL   Basophils Relative 2 %   Basophils Absolute 0.1 0.0 - 0.1 K/uL  I-stat troponin, ED     Status: None   Collection Time: 06/14/16  5:04 PM  Result Value Ref Range   Troponin i, poc 0.03 0.00 - 0.08 ng/mL   Comment 3            Comment: Due to the release kinetics of  cTnI, a negative result within the first hours of the onset of symptoms does not rule out myocardial infarction with certainty. If myocardial infarction is still suspected, repeat the test at appropriate intervals.   I-stat chem 8, ed     Status: Abnormal   Collection Time: 06/14/16  5:06 PM  Result Value Ref Range   Sodium 141 135 - 145 mmol/L   Potassium 6.3 (HH) 3.5 - 5.1 mmol/L   Chloride 109 101 - 111 mmol/L   BUN 39 (H) 6 - 20 mg/dL   Creatinine, Ser 1.80 (H) 0.61 - 1.24 mg/dL   Glucose, Bld 86 65 - 99 mg/dL   Calcium, Ion 1.11 (L) 1.15 - 1.40 mmol/L   TCO2 25 0 - 100 mmol/L   Hemoglobin 14.6 13.0 - 17.0 g/dL   HCT 43.0 39.0 - 52.0 %   Comment NOTIFIED PHYSICIAN   Comprehensive metabolic panel  Status: Abnormal   Collection Time: 06/14/16  7:43 PM  Result Value Ref Range   Sodium 143 135 - 145 mmol/L   Potassium 4.1 3.5 - 5.1 mmol/L   Chloride 111 101 - 111 mmol/L   CO2 23 22 - 32 mmol/L   Glucose, Bld 101 (H) 65 - 99 mg/dL   BUN 22 (H) 6 - 20 mg/dL   Creatinine, Ser 1.79 (H) 0.61 - 1.24 mg/dL   Calcium 9.6 8.9 - 10.3 mg/dL   Total Protein 5.8 (L) 6.5 - 8.1 g/dL   Albumin 3.5 3.5 - 5.0 g/dL   AST 37 15 - 41 U/L   ALT 25 17 - 63 U/L   Alkaline Phosphatase 102 38 - 126 U/L   Total Bilirubin 2.5 (H) 0.3 - 1.2 mg/dL   GFR calc non Af Amer 39 (L) >60 mL/min   GFR calc Af Amer 45 (L) >60 mL/min    Comment: (NOTE) The eGFR has been calculated using the CKD EPI equation. This calculation has not been validated in all clinical situations. eGFR's persistently <60 mL/min signify possible Chronic Kidney Disease.    Anion gap 9 5 - 15  Basic metabolic panel     Status: Abnormal   Collection Time: 06/15/16  4:45 AM  Result Value Ref Range   Sodium 142 135 - 145 mmol/L   Potassium 4.0 3.5 - 5.1 mmol/L   Chloride 111 101 - 111 mmol/L   CO2 21 (L) 22 - 32 mmol/L   Glucose, Bld 113 (H) 65 - 99 mg/dL   BUN 23 (H) 6 - 20 mg/dL   Creatinine, Ser 1.93 (H) 0.61 - 1.24 mg/dL     Calcium 9.4 8.9 - 10.3 mg/dL   GFR calc non Af Amer 36 (L) >60 mL/min   GFR calc Af Amer 41 (L) >60 mL/min    Comment: (NOTE) The eGFR has been calculated using the CKD EPI equation. This calculation has not been validated in all clinical situations. eGFR's persistently <60 mL/min signify possible Chronic Kidney Disease.    Anion gap 10 5 - 15  CBC     Status: Abnormal   Collection Time: 06/15/16  4:45 AM  Result Value Ref Range   WBC 4.1 4.0 - 10.5 K/uL   RBC 4.04 (L) 4.22 - 5.81 MIL/uL   Hemoglobin 14.0 13.0 - 17.0 g/dL   HCT 42.3 39.0 - 52.0 %   MCV 104.7 (H) 78.0 - 100.0 fL   MCH 34.7 (H) 26.0 - 34.0 pg   MCHC 33.1 30.0 - 36.0 g/dL   RDW 15.1 11.5 - 15.5 %   Platelets 108 (L) 150 - 400 K/uL    Comment: REPEATED TO VERIFY PLATELET COUNT CONFIRMED BY SMEAR   Basic metabolic panel     Status: Abnormal   Collection Time: 06/16/16  7:06 AM  Result Value Ref Range   Sodium 139 135 - 145 mmol/L   Potassium 3.8 3.5 - 5.1 mmol/L   Chloride 109 101 - 111 mmol/L   CO2 21 (L) 22 - 32 mmol/L   Glucose, Bld 106 (H) 65 - 99 mg/dL   BUN 21 (H) 6 - 20 mg/dL   Creatinine, Ser 1.81 (H) 0.61 - 1.24 mg/dL   Calcium 9.0 8.9 - 10.3 mg/dL   GFR calc non Af Amer 38 (L) >60 mL/min   GFR calc Af Amer 45 (L) >60 mL/min    Comment: (NOTE) The eGFR has been calculated using the CKD EPI equation. This  calculation has not been validated in all clinical situations. eGFR's persistently <60 mL/min signify possible Chronic Kidney Disease.    Anion gap 9 5 - 15   Micro Results: Recent Results (from the past 240 hour(s))  MRSA PCR Screening     Status: Abnormal   Collection Time: 06/11/16  9:00 PM  Result Value Ref Range Status   MRSA by PCR POSITIVE (A) NEGATIVE Final    Comment:        The GeneXpert MRSA Assay (FDA approved for NASAL specimens only), is one component of a comprehensive MRSA colonization surveillance program. It is not intended to diagnose MRSA infection nor to guide  or monitor treatment for MRSA infections. RESULT CALLED TO, READ BACK BY AND VERIFIED WITH: S GONTHIER,RN '@[id number]'  06/12/16 MKELLY,MLT     Studies/Results: No results found. Medications: I have reviewed the patient's current medications. Scheduled Meds: . allopurinol  300 mg Oral Daily  . aspirin EC  81 mg Oral Daily  . atorvastatin  80 mg Oral Daily  . enoxaparin (LOVENOX) injection  40 mg Subcutaneous Daily  . famotidine  20 mg Oral BID  . PARoxetine  20 mg Oral Daily  . sodium chloride flush  3 mL Intravenous Q12H   Continuous Infusions: PRN Meds:.albuterol   Assessment/Plan: Principal Problem:   Hypotensive episode Active Problems:   Chronic kidney disease   Chronic combined systolic and diastolic congestive heart failure (HCC)   Generalized anxiety disorder   Hypotension  [name] is a 61 y.o. male with a history of combined systolic and diastolic heart failure admitted for hypotension and syncope in the setting of recent increase in diuretic regimen.  Hypotension, syncope: Per, outpatient cardiologist, recently increased lasix from 40 mg BID to 80 mg BID at appointment 2 weeks ago due to increased lower extremity edema. Has been taking entresto for several months. Orthostatic vitals normal today. - Decrease lasix to 40 mg BID - Hold entresto until outpatient f/u - Schedule outpatient cardiology f/u  Combined systolic and diastolic heart failure: Hx of non-ischemic dilated cardiomyopathy (EF 25-30% in Oct 2016), NSTEMI in June 2016 in the setting of cocaine use. Does not appear to be fluid overloaded at the time. - repeat echo today-> EF reduced to 93-96%, PA systolic pressure increased from last echo with peak PA pressure of 44 mmHg - Hold entresto, decrease lasix as above - continue aspirin 81 mg and atorvastatin 80 mg  Generalized anxiety disorder - continue paroxetine 20 mg  Gout - continue allopurinol 317m  This is a MCareers information officerNote.  The care of  the patient was discussed with Dr. VEvette Doffingand the assessment and plan formulated with his assistance.  Please see his attached note for official documentation of the daily encounter.   LOS: 1 day   EArmen Pickup Medical Student 06/16/2016, 1:53 PM

## 2016-06-16 NOTE — Discharge Summary (Signed)
Name: [name] MRN: [id number] DOB: February 14, 1956 60 y.o. PCP: [name], MD  Date of Admission: 06/14/2016  3:59 PM Date of Discharge: 06/16/2016 Attending Physician: Axel Filler, MD  Discharge Diagnosis:  Principal Problem:   Hypotensive episode Active Problems:   Chronic kidney disease   Chronic combined systolic and diastolic congestive heart failure (HCC)   Generalized anxiety disorder   Hypotension   Discharge Medications:   Medication List    TAKE these medications   albuterol 108 (90 Base) MCG/ACT inhaler Commonly known as:  PROVENTIL HFA;VENTOLIN HFA Inhale 1-2 puffs into the lungs every 6 (six) hours as needed for wheezing or shortness of breath.   allopurinol 300 MG tablet Commonly known as:  ZYLOPRIM Take 300 mg by mouth daily.   aspirin 81 MG EC tablet Take 1 tablet (81 mg total) by mouth daily.   atorvastatin 80 MG tablet Commonly known as:  LIPITOR Take 80 mg by mouth daily.   carvedilol 3.125 MG tablet Commonly known as:  COREG Take 1 tablet (3.125 mg total) by mouth 2 (two) times daily with a meal. Discontinue 6.25mg    clopidogrel 75 MG tablet Commonly known as:  PLAVIX Take 75 mg by mouth daily.   ENTRESTO 24-26 MG Generic drug:  sacubitril-valsartan Take 1 tablet by mouth 2 (two) times daily.   furosemide 40 MG tablet Commonly known as:  LASIX Take 1 tablet (40 mg total) by mouth 2 (two) times daily. What changed:  how much to take   nitroGLYCERIN 0.4 MG SL tablet Commonly known as:  NITROSTAT Place 1 tablet (0.4 mg total) under the tongue every 5 (five) minutes x 3 doses as needed for chest pain.   PARoxetine 20 MG tablet Commonly known as:  PAXIL Take 1 tablet (20 mg total) by mouth daily.   potassium chloride SA 20 MEQ tablet Commonly known as:  K-DUR,KLOR-CON Take 20 mEq by mouth daily.   ranitidine 150 MG tablet Commonly known as:  ZANTAC Take 1 tablet (150 mg total) by mouth 2 (two) times daily.     tiZANidine 4 MG tablet Commonly known as:  ZANAFLEX TAKE 1 TABLET BY MOUTH EVERY 8 HOURS AS NEEDED FOR MUSCLE SPASMS.       Disposition and follow-up:   [name] was discharged from [facility] in Stable condition.  At the hospital follow up visit please address:  1.  Heart Failure and Antihypertensive Regimen.  Chronic worsening dyspnea and exercise capacity with subacute syncopal and presyncopal episodes.   Please address diuretic and antihypertensive regimens.  2.  Labs / imaging needed at time of follow-up: orthostatic vital signs  3.  Pending labs/ test needing follow-up: none  Follow-up Appointments: Follow-up Information    [name], MD. Schedule an appointment as soon as possible for a visit in 1 month(s).   Specialty:  Family Medicine Contact information: [place] [address] [phone]        Charolette Forward, MD Follow up in 7 day(s).   Specialty:  Cardiology Why:  at 1:45 pm Contact information: [address] [address] [place] [facility] Course by problem list: Principal Problem:   Hypotensive episode Active Problems:   Chronic kidney disease   Chronic combined systolic and diastolic congestive heart failure (HCC)   Generalized anxiety disorder   Hypotension   1. Syncope and Hypotension [name] returned to the ED shortly  after discharge for dyspnea with a syncopal episode.  He reported sleeping on his couch, waking up, and syncopizing immediately after standing up.  His Lasix, Carvedilol, and Entresto were held while during his admission and he was normotensive.  Discharged on Lasix 40 mg BID and Carvedilol 3.125 mg BID.  His home Lasix was increased from 40 mg BID to 80 mg BID 2 weeks ago.  His syncopal epsiode was likely due to orthostatic hypotensive form over diuresis and/or antihypertensives.  Echocardiogram suring the admission showed interval  worsening of his systolic function in the past year, with EF now 20-25%.  He was discharged with instruction to hold [facility] and resume 40 mg Lasix BID with close follow-up with his cardiologist.  2. CHF He appears euvolemic throughout the admission.  Diuretics were held and he received gentle volume repletion with 500 mL normal saline.  3. Anxiety Continue paroxetine 20 mg daily.  Discharge Vitals:   BP (!) 117/93 (BP Location: Left Arm)   Pulse 94   Temp 97.7 F (36.5 C) (Oral)   Resp 14   Ht 5\' 8"  (1.727 m)   Wt 183 lb 8 oz (83.2 kg)   SpO2 100%   BMI 27.90 kg/m   Pertinent Labs, Studies, and Procedures:   CBC Latest Ref Rng & Units 06/15/2016 06/14/2016 06/14/2016  WBC 4.0 - 10.5 K/uL 4.1 - 4.6  Hemoglobin 13.0 - 17.0 g/dL 14.0 14.6 13.6  Hematocrit 39.0 - 52.0 % 42.3 43.0 42.0  Platelets 150 - 400 K/uL 108(L) - 167   CMP Latest Ref Rng & Units 06/16/2016 06/15/2016 06/14/2016  Glucose 65 - 99 mg/dL 106(H) 113(H) 101(H)  BUN 6 - 20 mg/dL 21(H) 23(H) 22(H)  Creatinine 0.61 - 1.24 mg/dL 1.81(H) 1.93(H) 1.79(H)  Sodium 135 - 145 mmol/L 139 142 143  Potassium 3.5 - 5.1 mmol/L 3.8 4.0 4.1  Chloride 101 - 111 mmol/L 109 111 111  CO2 22 - 32 mmol/L 21(L) 21(L) 23  Calcium 8.9 - 10.3 mg/dL 9.0 9.4 9.6  Total Protein 6.5 - 8.1 g/dL - - 5.8(L)  Total Bilirubin 0.3 - 1.2 mg/dL - - 2.5(H)  Alkaline Phos 38 - 126 U/L - - 102  AST 15 - 41 U/L - - 37  ALT 17 - 63 U/L - - 25   Echocardiogram 06/16/2016 EF 20-25%, diffuse hypokinesis  CBC Latest Ref Rng & Units 06/15/2016 06/14/2016 06/14/2016  WBC 4.0 - 10.5 K/uL 4.1 - 4.6  Hemoglobin 13.0 - 17.0 g/dL 14.0 14.6 13.6  Hematocrit 39.0 - 52.0 % 42.3 43.0 42.0  Platelets 150 - 400 K/uL 108(L) - 167   CMP Latest Ref Rng & Units 06/16/2016 06/15/2016 06/14/2016  Glucose 65 - 99 mg/dL 106(H) 113(H) 101(H)  BUN 6 - 20 mg/dL 21(H) 23(H) 22(H)  Creatinine 0.61 - 1.24 mg/dL 1.81(H) 1.93(H) 1.79(H)  Sodium 135 - 145 mmol/L 139 142  143  Potassium 3.5 - 5.1 mmol/L 3.8 4.0 4.1  Chloride 101 - 111 mmol/L 109 111 111  CO2 22 - 32 mmol/L 21(L) 21(L) 23  Calcium 8.9 - 10.3 mg/dL 9.0 9.4 9.6  Total Protein 6.5 - 8.1 g/dL - - 5.8(L)  Total Bilirubin 0.3 - 1.2 mg/dL - - 2.5(H)  Alkaline Phos 38 - 126 U/L - - 102  AST 15 - 41 U/L - - 37  ALT 17 - 63 U/L - - 25    Discharge Instructions: Discharge Instructions    Diet - low sodium heart healthy  Complete by:  As directed    Increase activity slowly    Complete by:  As directed      You were admitted to the hospital because your blood pressure was getting too low, causing you to pass out.  Please go back to taking Lasix 40 mg twice per day (rather than 80 mg twice per day) and stop taking Entresto.  Follow-up with Dr Terrence Dupont next week.  If you have chest pain, pass out, or have worse shortness of breath, please come back to the ED.  Signed: Minus Liberty, MD 06/16/2016, 2:04 PM   Pager: [phone]

## 2016-06-16 NOTE — Progress Notes (Signed)
Subjective: Feels good, with no dizziness or chest pain, occasional baseline dyspnea.  Has walked to bathroom without incident.  Objective:  Vital signs in last 24 hours: Vitals:   06/15/16 1530 06/15/16 1657 06/15/16 2111 06/16/16 0541  BP: 104/64 100/81 110/80 122/90  Pulse: 92 100 (!) 102 94  Resp: 14     Temp:  97.5 F (36.4 C) 98.4 F (36.9 C) 97.7 F (36.5 C)  TempSrc:  Oral Oral Oral  SpO2: 99% 100% 100% 100%  Weight:  185 lb 8 oz (84.1 kg)  183 lb 8 oz (83.2 kg)  Height:  5\' 8"  (1.727 m)     Orthostatic VS for the past 24 hrs:  BP- Lying Pulse- Lying BP- Sitting Pulse- Sitting BP- Standing at 0 minutes Pulse- Standing at 0 minutes  06/16/16 0959 117/73 99 116/90 83 (!) 110/92 102   BMP Latest Ref Rng & Units 06/16/2016 06/15/2016 06/14/2016  Glucose 65 - 99 mg/dL 106(H) 113(H) 101(H)  BUN 6 - 20 mg/dL 21(H) 23(H) 22(H)  Creatinine 0.61 - 1.24 mg/dL 1.81(H) 1.93(H) 1.79(H)  Sodium 135 - 145 mmol/L 139 142 143  Potassium 3.5 - 5.1 mmol/L 3.8 4.0 4.1  Chloride 101 - 111 mmol/L 109 111 111  CO2 22 - 32 mmol/L 21(L) 21(L) 23  Calcium 8.9 - 10.3 mg/dL 9.0 9.4 9.6    Physical Exam  Constitutional: He is oriented to person, place, and time. He appears well-developed and well-nourished. No distress.  Cardiovascular: Normal rate and regular rhythm.   Pulmonary/Chest: Effort normal and breath sounds normal.  Neurological: He is alert and oriented to person, place, and time.  Psychiatric: He has a normal mood and affect. His behavior is normal.     CBC Latest Ref Rng & Units 06/15/2016 06/14/2016 06/14/2016  WBC 4.0 - 10.5 K/uL 4.1 - 4.6  Hemoglobin 13.0 - 17.0 g/dL 14.0 14.6 13.6  Hematocrit 39.0 - 52.0 % 42.3 43.0 42.0  Platelets 150 - 400 K/uL 108(L) - 167   BMP Latest Ref Rng & Units 06/16/2016 06/15/2016 06/14/2016  Glucose 65 - 99 mg/dL 106(H) 113(H) 101(H)  BUN 6 - 20 mg/dL 21(H) 23(H) 22(H)  Creatinine 0.61 - 1.24 mg/dL 1.81(H) 1.93(H) 1.79(H)  Sodium 135  - 145 mmol/L 139 142 143  Potassium 3.5 - 5.1 mmol/L 3.8 4.0 4.1  Chloride 101 - 111 mmol/L 109 111 111  CO2 22 - 32 mmol/L 21(L) 21(L) 23  Calcium 8.9 - 10.3 mg/dL 9.0 9.4 9.6   Echocardiogram 06/16/2016 EF 20-25%, diffuse hypokinesis   Assessment/Plan:  Principal Problem:   Hypotensive episode Active Problems:   Chronic kidney disease   Chronic combined systolic and diastolic congestive heart failure (HCC)   Generalized anxiety disorder   Hypotension  60 year old man with CHF and poor EF with chronic worsening dyspnea and subacute to acute symptomatic hypotension. He recently had his home diuretic regimen increased due to peripheral edema, and overdiuresis may explain his more recent symptoms.  #Hypotension #Syncope May need reduction in his diuretic and antihypertensive regimen. -Hold home lasix, entresto, carvedilol, tizanidine -Refrain from further IVF unless unstable  #CHF Last echo 10/26, EF 25-30% with diffuse hypokinesis and dilated R atrium.   Clinically, he is not volume overloaded despite his elevated BNP.  Delene Loll has been associated with increased BNP levels independent of volume status, though at the population level the magnitude of this increase in small. -Hold home lasix, entresto, carvedilol -Repeat echocardiogram  #Anxiety -Continue paroxetine 20 mg  daily  Dispo: Anticipated discharge today.  Minus Liberty, MD 06/16/2016, 9:00 AM Pager: [phone]

## 2016-06-16 NOTE — Care Management Note (Addendum)
Case Management Note  Patient Details  Name: [name] MRN: [id number] Date of Birth: 1956/06/02  Subjective/Objective:   Pt presented for Hypotension and fall. Pt resides at [facility]- states his children check in from time to time. Pt has had several hospital visits in the past months. Pt ambulates with DME Cane.                   Action/Plan: CM did speak with pt in regards to [facility] RN to see if can assist with medication management. Pt is refusing services at this time. CM spoke with pt at length in regards to goals for d/c. CM did speak with pt in regards to option of having [facility] to f/u in the community. Pt stated he had heard of agency-and that it was ok to have CM contact agency. CM did contact Liaison to see if was active. No further needs from CM at this time.   Expected Discharge Date:                  Expected Discharge Plan:  Home/Self Care  In-House Referral:  NA  Discharge planning Services  CM Consult  Post Acute Care Choice:    Choice offered to:  NA  DME Arranged:  N/A DME Agency:  NA  HH Arranged:  Patient Refused [place] Agency:  NA  Status of Service:  Completed, signed off  If discussed at [place] of Stay Meetings, dates discussed:    Additional Comments: [facility] Liaison did try to reach out to patient. Patient is willing to accept phone calls no visits at this time. [place] 06-16-16 Jacqlyn Krauss RN, BSN [phone] Bethena Roys, RN 06/16/2016, 12:16 PM

## 2016-06-16 NOTE — Discharge Instructions (Signed)
You were admitted to the hospital because your blood pressure was getting too low, causing you to pass out.  Please go back to taking Lasix 40 mg twice per day (rather than 80 mg twice per day) and stop taking Entresto.  Follow-up with Dr Terrence Dupont next week.  If you have chest pain, pass out, or have worse shortness of breath, please come back to the ED.   Hypotension As your heart beats, it forces blood through your body. This force is called blood pressure. If you have hypotension, you have low blood pressure. When your blood pressure is too low, you may not get enough blood to your brain. You may feel weak, feel light-headed, have a fast heartbeat, or even pass out (faint). Follow these instructions at home: Eating and drinking  Drink enough fluids to keep your pee (urine) clear or pale yellow.  Eat a healthy diet, and follow instructions from your doctor about eating or drinking restrictions. A healthy diet includes:  Fresh fruits and vegetables.  Whole grains.  Low-fat (lean) meats.  Low-fat dairy products.  Eat extra salt only as told. Do not add extra salt to your diet unless your doctor tells you to.  Eat small meals often.  Avoid standing up quickly after you eat. Medicines  Take over-the-counter and prescription medicines only as told by your doctor.  Follow instructions from your doctor about changing how much you take (the dosage) of your medicines, if this applies.  Do not stop or change your medicine on your own. General instructions  Wear compression stockings as told by your doctor.  Get up slowly from lying down or sitting.  Avoid hot showers and a lot of heat as told by your doctor.  Return to your normal activities as told by your doctor. Ask what activities are safe for you.  Do not use any products that contain nicotine or tobacco, such as cigarettes and e-cigarettes. If you need help quitting, ask your doctor.  Keep all follow-up visits as told  by your doctor. This is important. Contact a doctor if:  You throw up (vomit).  You have watery poop (diarrhea).  You have a fever for more than 2-3 days.  You feel more thirsty than normal.  You feel weak and tired. Get help right away if:  You have chest pain.  You have a fast or irregular heartbeat.  You lose feeling (get numbness) in any part of your body.  You cannot move your arms or your legs.  You have trouble talking.  You get sweaty or feel light-headed.  You faint.  You have trouble breathing.  You have trouble staying awake.  You feel confused. This information is not intended to replace advice given to you by your health care provider. Make sure you discuss any questions you have with your health care provider. Document Released: 09/28/2009 Document Revised: 03/22/2016 Document Reviewed: 03/22/2016 Elsevier Interactive Patient Education  2017 [employer].

## 2016-06-16 NOTE — Progress Notes (Signed)
  Echocardiogram 2D Echocardiogram has been performed.  [name] M 06/16/2016, 9:56 AM

## 2016-06-28 NOTE — ED Provider Notes (Signed)
[facility] DEPT Provider Note   CSN: [id number] Arrival date & time: 06/14/16  1516     History   Chief Complaint Chief Complaint  Patient presents with  . Anxiety    HPI [name] is a 60 y.o. male. He presents today feeling jittery. Just discharged from admission for chest pain and ruled out. History of previous cardiac disease. Had medications adjusted. States he took his medicines at home with grapefruit juice. He is somewhat fixated that this may be a culprit. His been feeling weak and jittery since that time and presents here.  Patient has history of previous cocaine use and myopathy. History of medicine noncompliance. Initial pressure 84/5062.  HPI  Past Medical History:  Diagnosis Date  . Anxiety   . Arthritis    "all over" (01/07/2015)  . CKD (chronic kidney disease), stage IV ([place])   . Depression   . Headache    "probably weekly" (01/07/2015)  . Heart attack 07/2014  . History of echocardiogram 12/2014   EF 20-25%  . Homelessness   . Hypercholesterolemia   . Hypertension   . Ischemic dilated cardiomyopathy ([place])   . Noncompliance   . NSTEMI (non-ST elevated myocardial infarction) ([place]) 01/07/2015  . Polysubstance abuse    etoh, cocaine  . Urinary hesitancy   . Walking pneumonia 07/2014    Patient Active Problem List   Diagnosis Date Noted  . Hypotension 06/14/2016  . Generalized anxiety disorder 06/13/2016  . Heart failure ([place]) 06/11/2016  . Dehydration 04/29/2016  . Dermatitis 01/27/2016  . Arthralgia 08/25/2015  . Hyperbilirubinemia   . Renal failure (ARF), acute on chronic (HCC)   . Dyspnea   . Chronic kidney disease 05/03/2015  . Chronic combined systolic and diastolic congestive heart failure ([place]) 05/03/2015  . Benign essential HTN 05/03/2015  . History of non-ST elevation myocardial infarction (NSTEMI) 05/03/2015  . Abnormal LFTs 05/03/2015  . Polysubstance abuse   . Noncompliance   . Gout 03/24/2015  . Hypotensive episode  09/03/2014    Past Surgical History:  Procedure Laterality Date  . CARDIAC CATHETERIZATION    . LEFT HEART CATHETERIZATION WITH CORONARY ANGIOGRAM N/A 08/15/2014   Procedure: LEFT HEART CATHETERIZATION WITH CORONARY ANGIOGRAM;  Surgeon: Clent Demark, MD;  Location: [facility] CATH LAB;  Service: Cardiovascular;  Laterality: N/A;       Home Medications    Prior to Admission medications   Medication Sig Start Date End Date Taking? Authorizing Provider  albuterol (PROVENTIL HFA;VENTOLIN HFA) 108 (90 Base) MCG/ACT inhaler Inhale 1-2 puffs into the lungs every 6 (six) hours as needed for wheezing or shortness of breath. 04/18/16  Yes [name], MD  allopurinol (ZYLOPRIM) 300 MG tablet Take 300 mg by mouth daily.   Yes Historical Provider, MD  aspirin EC 81 MG EC tablet Take 1 tablet (81 mg total) by mouth daily. 06/13/16  Yes Minus Liberty, MD  atorvastatin (LIPITOR) 80 MG tablet Take 80 mg by mouth daily.   Yes Historical Provider, MD  carvedilol (COREG) 3.125 MG tablet Take 1 tablet (3.125 mg total) by mouth 2 (two) times daily with a meal. Discontinue 6.25mg  01/27/16  Yes [name], MD  clopidogrel (PLAVIX) 75 MG tablet Take 75 mg by mouth daily.   Yes Historical Provider, MD  furosemide (LASIX) 40 MG tablet Take 1 tablet (40 mg total) by mouth 2 (two) times daily. Patient taking differently: Take 80 mg by mouth 2 (two) times daily.  05/01/16  Yes Dixie Dials, MD  nitroGLYCERIN (NITROSTAT)  0.4 MG SL tablet Place 1 tablet (0.4 mg total) under the tongue every 5 (five) minutes x 3 doses as needed for chest pain. 11/02/15  Yes [name], MD  potassium chloride SA (K-DUR,KLOR-CON) 20 MEQ tablet Take 20 mEq by mouth daily. 06/04/16  Yes Historical Provider, MD  ranitidine (ZANTAC) 150 MG tablet Take 1 tablet (150 mg total) by mouth 2 (two) times daily. 06/06/16  Yes [name], MD  tiZANidine (ZANAFLEX) 4 MG tablet TAKE 1 TABLET BY MOUTH EVERY 8 HOURS AS NEEDED FOR MUSCLE SPASMS. 04/18/16   Yes [name], MD  PARoxetine (PAXIL) 20 MG tablet Take 1 tablet (20 mg total) by mouth daily. 06/12/16   Minus Liberty, MD    Family History No family history on file.  Social History Social History  Substance Use Topics  . Smoking status: Former Smoker    Packs/day: 1.50    Years: 30.00    Types: Cigarettes    Quit date: 02/19/2004  . Smokeless tobacco: Never Used  . Alcohol use 1.2 - 2.4 oz/week    2 - 4 Cans of beer per week     Comment: 2 quarts daily     Allergies   Patient has no known allergies.   Review of Systems Review of Systems  Constitutional: Negative for appetite change, chills, diaphoresis, fatigue and fever.  HENT: Negative for mouth sores, sore throat and trouble swallowing.   Eyes: Negative for visual disturbance.  Respiratory: Negative for cough, chest tightness, shortness of breath and wheezing.   Cardiovascular: Negative for chest pain.  Gastrointestinal: Negative for abdominal distention, abdominal pain, diarrhea, nausea and vomiting.  Endocrine: Negative for polydipsia, polyphagia and polyuria.  Genitourinary: Negative for dysuria, frequency and hematuria.  Musculoskeletal: Negative for gait problem.  Skin: Negative for color change, pallor and rash.  Neurological: Positive for tremors and weakness. Negative for dizziness, syncope, light-headedness and headaches.  Hematological: Does not bruise/bleed easily.  Psychiatric/Behavioral: Negative for behavioral problems and confusion.     Physical Exam Updated Vital Signs BP (!) 117/93 (BP Location: Left Arm)   Pulse 94   Temp 97.7 F (36.5 C) (Oral)   Resp 14   Ht 5\' 8"  (1.727 m)   Wt 183 lb 8 oz (83.2 kg)   SpO2 100%   BMI 27.90 kg/m   Physical Exam  Constitutional: He is oriented to person, place, and time. He appears well-developed and well-nourished. No distress.  HENT:  Head: Normocephalic.  Eyes: Conjunctivae are normal. Pupils are equal, round, and reactive to light. No  scleral icterus.  Neck: Normal range of motion. Neck supple. No thyromegaly present.  Cardiovascular: Normal rate and regular rhythm.  Exam reveals no gallop and no friction rub.   No murmur heard. Pulmonary/Chest: Effort normal and breath sounds normal. No respiratory distress. He has no wheezes. He has no rales.  Abdominal: Soft. Bowel sounds are normal. He exhibits no distension. There is no tenderness. There is no rebound.  Musculoskeletal: Normal range of motion.  Neurological: He is alert and oriented to person, place, and time.  Skin: Skin is warm and dry. No rash noted.  Psychiatric: He has a normal mood and affect. His behavior is normal.     ED Treatments / Results  Labs (all labs ordered are listed, but only abnormal results are displayed) Labs Reviewed  CBC WITH DIFFERENTIAL/PLATELET - Abnormal; Notable for the following:       Result Value   RBC 4.02 (*)    MCV  104.5 (*)    All other components within normal limits  COMPREHENSIVE METABOLIC PANEL - Abnormal; Notable for the following:    Glucose, Bld 101 (*)    BUN 22 (*)    Creatinine, Ser 1.79 (*)    Total Protein 5.8 (*)    Total Bilirubin 2.5 (*)    GFR calc non Af Amer 39 (*)    GFR calc Af Amer 45 (*)    All other components within normal limits  BASIC METABOLIC PANEL - Abnormal; Notable for the following:    CO2 21 (*)    Glucose, Bld 113 (*)    BUN 23 (*)    Creatinine, Ser 1.93 (*)    GFR calc non Af Amer 36 (*)    GFR calc Af Amer 41 (*)    All other components within normal limits  CBC - Abnormal; Notable for the following:    RBC 4.04 (*)    MCV 104.7 (*)    MCH 34.7 (*)    Platelets 108 (*)    All other components within normal limits  BASIC METABOLIC PANEL - Abnormal; Notable for the following:    CO2 21 (*)    Glucose, Bld 106 (*)    BUN 21 (*)    Creatinine, Ser 1.81 (*)    GFR calc non Af Amer 38 (*)    GFR calc Af Amer 45 (*)    All other components within normal limits  I-STAT CHEM  8, ED - Abnormal; Notable for the following:    Potassium 6.3 (*)    BUN 39 (*)    Creatinine, Ser 1.80 (*)    Calcium, Ion 1.11 (*)    All other components within normal limits  I-STAT TROPOININ, ED    EKG  EKG Interpretation  Date/Time:  Tuesday June 14 2016 16:27:25 EST Ventricular Rate:  74 PR Interval:    QRS Duration: 188 QT Interval:  484 QTC Calculation: 538 R Axis:   -98 Text Interpretation:  Sinus rhythm Prolonged PR interval Right bundle branch block No Change vx 11/26 Confirmed by Jeneen Rinks  MD, [place] ([id number]) on 06/14/2016 4:35:12 PM       Radiology No results found.  Procedures Procedures (including critical care time)  Medications Ordered in ED Medications  sodium chloride 0.9 % bolus 250 mL (0 mLs Intravenous Stopped 06/14/16 1709)  calcium gluconate 1 g in sodium chloride 0.9 % 100 mL IVPB (0 g Intravenous Stopped 06/14/16 1800)  sodium chloride 0.9 % bolus 250 mL (0 mLs Intravenous Stopped 06/14/16 1908)     Initial Impression / Assessment and Plan / ED Course  I have reviewed the triage vital signs and the nursing notes.  Pertinent labs & imaging results that were available during my care of the patient were reviewed by me and considered in my medical decision making (see chart for details).  Clinical Course     Persistent hypotension despite fluids. Will need override overnight monitoring and medication adjustments. Discussed with residents. Patient is to be admitted under the family medicine team'scare.  Final Clinical Impressions(s) / ED Diagnoses   Final diagnoses:  Hypotension, unspecified hypotension type    New Prescriptions Discharge Medication List as of 06/16/2016  3:37 PM       Tanna Furry, MD 06/28/16 0028

## 2016-07-04 ENCOUNTER — Inpatient Hospital Stay (HOSPITAL_COMMUNITY)
Admission: EM | Admit: 2016-07-04 | Discharge: 2016-07-06 | DRG: 291 | Disposition: A | Discharge status: Home / Self Care | Payer: Medicaid Other | Attending: Internal Medicine | Admitting: Internal Medicine

## 2016-07-04 ENCOUNTER — Emergency Department (HOSPITAL_COMMUNITY): Payer: Medicaid Other

## 2016-07-04 ENCOUNTER — Encounter (HOSPITAL_COMMUNITY): Payer: Self-pay

## 2016-07-04 ENCOUNTER — Other Ambulatory Visit: Payer: Self-pay

## 2016-07-04 DIAGNOSIS — Z9114 Patient's other noncompliance with medication regimen: Secondary | ICD-10-CM

## 2016-07-04 DIAGNOSIS — E7800 Pure hypercholesterolemia, unspecified: Secondary | ICD-10-CM | POA: Diagnosis present

## 2016-07-04 DIAGNOSIS — I252 Old myocardial infarction: Secondary | ICD-10-CM

## 2016-07-04 DIAGNOSIS — K921 Melena: Secondary | ICD-10-CM | POA: Diagnosis present

## 2016-07-04 DIAGNOSIS — E1122 Type 2 diabetes mellitus with diabetic chronic kidney disease: Secondary | ICD-10-CM | POA: Diagnosis present

## 2016-07-04 DIAGNOSIS — D7589 Other specified diseases of blood and blood-forming organs: Secondary | ICD-10-CM | POA: Diagnosis present

## 2016-07-04 DIAGNOSIS — I420 Dilated cardiomyopathy: Secondary | ICD-10-CM | POA: Diagnosis present

## 2016-07-04 DIAGNOSIS — Z7982 Long term (current) use of aspirin: Secondary | ICD-10-CM

## 2016-07-04 DIAGNOSIS — N401 Enlarged prostate with lower urinary tract symptoms: Secondary | ICD-10-CM | POA: Diagnosis present

## 2016-07-04 DIAGNOSIS — R3911 Hesitancy of micturition: Secondary | ICD-10-CM | POA: Diagnosis present

## 2016-07-04 DIAGNOSIS — I255 Ischemic cardiomyopathy: Secondary | ICD-10-CM | POA: Diagnosis present

## 2016-07-04 DIAGNOSIS — N184 Chronic kidney disease, stage 4 (severe): Secondary | ICD-10-CM | POA: Diagnosis present

## 2016-07-04 DIAGNOSIS — N183 Chronic kidney disease, stage 3 unspecified: Secondary | ICD-10-CM | POA: Diagnosis present

## 2016-07-04 DIAGNOSIS — Z79899 Other long term (current) drug therapy: Secondary | ICD-10-CM

## 2016-07-04 DIAGNOSIS — I130 Hypertensive heart and chronic kidney disease with heart failure and stage 1 through stage 4 chronic kidney disease, or unspecified chronic kidney disease: Principal | ICD-10-CM | POA: Diagnosis present

## 2016-07-04 DIAGNOSIS — I5021 Acute systolic (congestive) heart failure: Secondary | ICD-10-CM

## 2016-07-04 DIAGNOSIS — M109 Gout, unspecified: Secondary | ICD-10-CM | POA: Diagnosis present

## 2016-07-04 DIAGNOSIS — I2510 Atherosclerotic heart disease of native coronary artery without angina pectoris: Secondary | ICD-10-CM | POA: Diagnosis present

## 2016-07-04 DIAGNOSIS — Z87891 Personal history of nicotine dependence: Secondary | ICD-10-CM

## 2016-07-04 DIAGNOSIS — I5023 Acute on chronic systolic (congestive) heart failure: Secondary | ICD-10-CM | POA: Diagnosis present

## 2016-07-04 DIAGNOSIS — Z7902 Long term (current) use of antithrombotics/antiplatelets: Secondary | ICD-10-CM

## 2016-07-04 DIAGNOSIS — J449 Chronic obstructive pulmonary disease, unspecified: Secondary | ICD-10-CM | POA: Diagnosis present

## 2016-07-04 DIAGNOSIS — R0609 Other forms of dyspnea: Secondary | ICD-10-CM

## 2016-07-04 DIAGNOSIS — F419 Anxiety disorder, unspecified: Secondary | ICD-10-CM | POA: Diagnosis present

## 2016-07-04 LAB — CBC WITH DIFFERENTIAL/PLATELET
Basophils Absolute: 0.1 10*3/uL (ref 0.0–0.1)
Basophils Relative: 1 %
EOS ABS: 0 10*3/uL (ref 0.0–0.7)
Eosinophils Relative: 1 %
HEMATOCRIT: 43.7 % (ref 39.0–52.0)
HEMOGLOBIN: 14.7 g/dL (ref 13.0–17.0)
LYMPHS ABS: 1.8 10*3/uL (ref 0.7–4.0)
LYMPHS PCT: 37 %
MCH: 34.3 pg — AB (ref 26.0–34.0)
MCHC: 33.6 g/dL (ref 30.0–36.0)
MCV: 102.1 fL — AB (ref 78.0–100.0)
MONOS PCT: 9 %
Monocytes Absolute: 0.4 10*3/uL (ref 0.1–1.0)
NEUTROS PCT: 52 %
Neutro Abs: 2.5 10*3/uL (ref 1.7–7.7)
Platelets: 153 10*3/uL (ref 150–400)
RBC: 4.28 MIL/uL (ref 4.22–5.81)
RDW: 15.4 % (ref 11.5–15.5)
WBC: 4.9 10*3/uL (ref 4.0–10.5)

## 2016-07-04 LAB — COMPREHENSIVE METABOLIC PANEL
ALK PHOS: 93 U/L (ref 38–126)
ALT: 18 U/L (ref 17–63)
ANION GAP: 15 (ref 5–15)
AST: 32 U/L (ref 15–41)
Albumin: 4.2 g/dL (ref 3.5–5.0)
BILIRUBIN TOTAL: 2.7 mg/dL — AB (ref 0.3–1.2)
BUN: 31 mg/dL — ABNORMAL HIGH (ref 6–20)
CALCIUM: 10.3 mg/dL (ref 8.9–10.3)
CO2: 21 mmol/L — ABNORMAL LOW (ref 22–32)
CREATININE: 2.15 mg/dL — AB (ref 0.61–1.24)
Chloride: 105 mmol/L (ref 101–111)
GFR, EST AFRICAN AMERICAN: 36 mL/min — AB (ref >60)
GFR, EST NON AFRICAN AMERICAN: 31 mL/min — AB (ref >60)
Glucose, Bld: 87 mg/dL (ref 65–99)
Potassium: 4.5 mmol/L (ref 3.5–5.1)
SODIUM: 141 mmol/L (ref 135–145)
TOTAL PROTEIN: 7.1 g/dL (ref 6.5–8.1)

## 2016-07-04 LAB — I-STAT TROPONIN, ED: TROPONIN I, POC: 0.03 ng/mL (ref 0.00–0.08)

## 2016-07-04 LAB — BRAIN NATRIURETIC PEPTIDE

## 2016-07-04 MED ORDER — FUROSEMIDE 10 MG/ML IJ SOLN
80.0000 mg | Freq: Once | INTRAMUSCULAR | Status: AC
  Administered 2016-07-04: 80 mg via INTRAVENOUS
  Filled 2016-07-04: qty 8

## 2016-07-04 MED ORDER — FUROSEMIDE 10 MG/ML IJ SOLN: 40.0000 mg | Freq: Once | INTRAMUSCULAR | Status: DC

## 2016-07-04 NOTE — ED Provider Notes (Signed)
[facility] DEPT Provider Note   CSN: [id number] Arrival date & time: 07/04/16  1629     History   Chief Complaint Chief Complaint  Patient presents with  . Shortness of Breath    HPI [name] is a 60 y.o. male.  The history is provided by the patient and medical records. No language interpreter was used.     60 year old male with a past medical history of anxiety, COPD, urinary artery disease who presents today with shortness of breath. This problem started today. He states he is also having coughing with this. No fever, chills, nausea, vomiting, diarrhea. No increase only in his lower extremities bilaterally. He also relates that today he noticed that he was having blood in the stool. He denies any pain with this.States last BM was before he got here and had bright red blood in it. No syncope or light-headedness. No numbness or tingling. Patient unsure of his meds but review of MAR shows he is on Plavix.   Past Medical History:  Diagnosis Date  . Anxiety   . Arthritis    "all over" (01/07/2015)  . CKD (chronic kidney disease), stage IV ([place])   . Depression   . Headache    "probably weekly" (01/07/2015)  . Heart attack 07/2014  . History of echocardiogram 12/2014   EF 20-25%  . Homelessness   . Hypercholesterolemia   . Hypertension   . Ischemic dilated cardiomyopathy ([place])   . Noncompliance   . NSTEMI (non-ST elevated myocardial infarction) ([place]) 01/07/2015  . Polysubstance abuse    etoh, cocaine  . Urinary hesitancy   . Walking pneumonia 07/2014    Patient Active Problem List   Diagnosis Date Noted  . Acute heart failure ([place]) 07/05/2016  . Hypotension 06/14/2016  . Generalized anxiety disorder 06/13/2016  . Heart failure ([place]) 06/11/2016  . Dehydration 04/29/2016  . Dermatitis 01/27/2016  . Arthralgia 08/25/2015  . Hyperbilirubinemia   . Renal failure (ARF), acute on chronic (HCC)   . Dyspnea   . Chronic kidney disease 05/03/2015  . Chronic combined  systolic and diastolic congestive heart failure ([place]) 05/03/2015  . Benign essential HTN 05/03/2015  . History of non-ST elevation myocardial infarction (NSTEMI) 05/03/2015  . Abnormal LFTs 05/03/2015  . Polysubstance abuse   . Noncompliance   . Gout 03/24/2015  . Hypotensive episode 09/03/2014    Past Surgical History:  Procedure Laterality Date  . CARDIAC CATHETERIZATION    . LEFT HEART CATHETERIZATION WITH CORONARY ANGIOGRAM N/A 08/15/2014   Procedure: LEFT HEART CATHETERIZATION WITH CORONARY ANGIOGRAM;  Surgeon: Clent Demark, MD;  Location: [facility] CATH LAB;  Service: Cardiovascular;  Laterality: N/A;       Home Medications    Prior to Admission medications   Medication Sig Start Date End Date Taking? Authorizing Provider  albuterol (PROVENTIL HFA;VENTOLIN HFA) 108 (90 Base) MCG/ACT inhaler Inhale 1-2 puffs into the lungs every 6 (six) hours as needed for wheezing or shortness of breath. 04/18/16  Yes [name], MD  allopurinol (ZYLOPRIM) 300 MG tablet Take 300 mg by mouth daily.   Yes Historical Provider, MD  aspirin EC 81 MG EC tablet Take 1 tablet (81 mg total) by mouth daily. 06/13/16  Yes Minus Liberty, MD  atorvastatin (LIPITOR) 80 MG tablet Take 80 mg by mouth daily.   Yes Historical Provider, MD  carvedilol (COREG) 3.125 MG tablet Take 1 tablet (3.125 mg total) by mouth 2 (two) times daily with a meal. Discontinue 6.25mg  Patient taking differently: Take  3.125 mg by mouth 2 (two) times daily with a meal.  01/27/16  Yes [name], MD  clopidogrel (PLAVIX) 75 MG tablet Take 75 mg by mouth daily.   Yes Historical Provider, MD  furosemide (LASIX) 80 MG tablet Take 80 mg by mouth 2 (two) times daily. 06/27/16  Yes Historical Provider, MD  Multiple Vitamin (MULTIVITAMIN WITH MINERALS) TABS tablet Take 1 tablet by mouth daily.   Yes Historical Provider, MD  nitroGLYCERIN (NITROSTAT) 0.4 MG SL tablet Place 1 tablet (0.4 mg total) under the tongue every 5 (five) minutes x 3  doses as needed for chest pain. 11/02/15  Yes [name], MD  PARoxetine (PAXIL) 20 MG tablet Take 1 tablet (20 mg total) by mouth daily. 06/12/16  Yes Minus Liberty, MD  permethrin (ELIMITE) 5 % cream Apply 1 application topically See admin instructions. Apply on the skin from the neck down at bedtime 06/28/16  Yes Historical Provider, MD  potassium chloride SA (K-DUR,KLOR-CON) 20 MEQ tablet Take 20 mEq by mouth daily. 06/04/16  Yes Historical Provider, MD  ranitidine (ZANTAC) 150 MG tablet Take 1 tablet (150 mg total) by mouth 2 (two) times daily. 06/06/16  Yes [name], MD  tamsulosin (FLOMAX) 0.4 MG CAPS capsule Take 0.4 mg by mouth daily. 06/13/16  Yes Historical Provider, MD  tiZANidine (ZANAFLEX) 4 MG tablet TAKE 1 TABLET BY MOUTH EVERY 8 HOURS AS NEEDED FOR MUSCLE SPASMS. Patient taking differently: Take 4 mg by mouth every 8 (eight) hours as needed for muscle spasms.  04/18/16  Yes [name], MD  triamcinolone cream (KENALOG) 0.1 % Apply 1 application topically 2 (two) times daily as needed (itching).  06/28/16  Yes Historical Provider, MD  furosemide (LASIX) 40 MG tablet Take 1 tablet (40 mg total) by mouth 2 (two) times daily. Patient not taking: Reported on 07/04/2016 05/01/16   Dixie Dials, MD    Family History No family history on file.  Social History Social History  Substance Use Topics  . Smoking status: Former Smoker    Packs/day: 1.50    Years: 30.00    Types: Cigarettes    Quit date: 02/19/2004  . Smokeless tobacco: Never Used  . Alcohol use 1.2 - 2.4 oz/week    2 - 4 Cans of beer per week     Comment: 2 quarts daily     Allergies   Patient has no known allergies.   Review of Systems Review of Systems  Constitutional: Negative for chills and fever.  HENT: Negative for congestion and rhinorrhea.   Respiratory: Positive for shortness of breath. Negative for chest tightness.   Cardiovascular: Negative for chest pain and leg swelling.    Gastrointestinal: Positive for anal bleeding (bright red blood). Negative for abdominal pain, nausea and vomiting.  Genitourinary: Negative for dysuria and hematuria.  Neurological: Negative for syncope and weakness.  Psychiatric/Behavioral: Negative for agitation and confusion.  All other systems reviewed and are negative.    Physical Exam Updated Vital Signs BP 114/98   Pulse (!) 54   Temp 97.8 F (36.6 C) (Axillary)   Resp (!) 27   SpO2 91%   Physical Exam  Constitutional: He appears well-developed and well-nourished.  HENT:  Head: Normocephalic and atraumatic.  Eyes: Conjunctivae are normal.  Neck: Neck supple. JVD present.  Cardiovascular: Normal rate and regular rhythm.   No murmur heard. Pulmonary/Chest: Breath sounds normal. He is in respiratory distress (mild to moderate). He has no wheezes.  Abdominal: Soft. There is no tenderness.  Musculoskeletal: He exhibits no edema.  Neurological: He is alert.  Skin: Skin is warm and dry.  Psychiatric: He has a normal mood and affect.  Nursing note and vitals reviewed.    ED Treatments / Results  Labs (all labs ordered are listed, but only abnormal results are displayed) Labs Reviewed  CBC WITH DIFFERENTIAL/PLATELET - Abnormal; Notable for the following:       Result Value   MCV 102.1 (*)    MCH 34.3 (*)    All other components within normal limits  COMPREHENSIVE METABOLIC PANEL - Abnormal; Notable for the following:    CO2 21 (*)    BUN 31 (*)    Creatinine, Ser 2.15 (*)    Total Bilirubin 2.7 (*)    GFR calc non Af Amer 31 (*)    GFR calc Af Amer 36 (*)    All other components within normal limits  BRAIN NATRIURETIC PEPTIDE - Abnormal; Notable for the following:    B Natriuretic Peptide >4,500.0 (*)    All other components within normal limits  I-STAT TROPOININ, ED    EKG  EKG Interpretation  Date/Time:  Monday July 04 2016 16:22:40 EST Ventricular Rate:  99 PR Interval:  194 QRS Duration: 170 QT  Interval:  426 QTC Calculation: 546 R Axis:   -110 Text Interpretation:  Sinus rhythm with marked sinus arrhythmia with occasional Premature ventricular complexes Right bundle branch block Anteroseptal infarct , age undetermined Abnormal ECG No significant change since last tracing other than sinus arrhythmia Confirmed by [facility] MD, ERIN ([id number]) on 07/04/2016 6:10:39 PM       Radiology Dg Chest 2 View  Result Date: 07/04/2016 CLINICAL DATA:  Shortness of breath for 1 day EXAM: CHEST  2 VIEW COMPARISON:  06/11/2016, 03/23/2016 FINDINGS: Small right-sided pleural effusion. No acute consolidation is visualized. There is mild cardiomegaly without overt failure. There is no pneumothorax identified. IMPRESSION: 1. Small right pleural effusion 2. Cardiomegaly without overt failure Electronically Signed   By: Donavan Foil M.D.   On: 07/04/2016 18:51    Procedures Procedures (including critical care time)  Medications Ordered in ED Medications  furosemide (LASIX) injection 80 mg (80 mg Intravenous Given 07/04/16 2323)     Initial Impression / Assessment and Plan / ED Course  I have reviewed the triage vital signs and the nursing notes.  Pertinent labs & imaging results that were available during my care of the patient were reviewed by me and considered in my medical decision making (see chart for details).  Clinical Course       Patient is a 60 year old male who presents today with worsening shortness of breath. On arrival, patient does have noted no shortness of breath that worsens with dyspnea. No significant peripheral edema was appreciated. Patient does have elevated JVD. EKG is unchanged compared to prior. Initial troponin is negative. Patient currently denies any chest pain. Obtained a BNP which was elevated to the 4000. I have high suspicion that the patient is in acute on chronic congestive heart failure given his known reduced ejection fraction. Part of my concern is that the  patient may have difficulty understanding his medication regimen at home. He will likely need further assistance in managing this prior to discharge. Patient is agreeable with admission and was stable at the time of admission.  Final Clinical Impressions(s) / ED Diagnoses   Final diagnoses:  Acute systolic congestive heart failure (HCC)  Dyspnea on exertion    New Prescriptions New Prescriptions  No medications on file     Theodosia Quay, MD 07/05/16 0010    Gareth Morgan, MD 07/07/16 1556

## 2016-07-04 NOTE — ED Triage Notes (Signed)
Pt presents to the ed with complaints of shortness of breath x 1 day, he also has been noticing some bright red blood in his stool. Denies any pain, or swelling.

## 2016-07-04 NOTE — ED Notes (Signed)
Pt ambulated to bathroom 

## 2016-07-05 DIAGNOSIS — Z9114 Patient's other noncompliance with medication regimen: Secondary | ICD-10-CM | POA: Diagnosis not present

## 2016-07-05 DIAGNOSIS — E7800 Pure hypercholesterolemia, unspecified: Secondary | ICD-10-CM | POA: Diagnosis present

## 2016-07-05 DIAGNOSIS — I420 Dilated cardiomyopathy: Secondary | ICD-10-CM | POA: Diagnosis present

## 2016-07-05 DIAGNOSIS — D7589 Other specified diseases of blood and blood-forming organs: Secondary | ICD-10-CM | POA: Diagnosis present

## 2016-07-05 DIAGNOSIS — I5023 Acute on chronic systolic (congestive) heart failure: Secondary | ICD-10-CM | POA: Diagnosis present

## 2016-07-05 DIAGNOSIS — J449 Chronic obstructive pulmonary disease, unspecified: Secondary | ICD-10-CM | POA: Diagnosis present

## 2016-07-05 DIAGNOSIS — K921 Melena: Secondary | ICD-10-CM | POA: Diagnosis present

## 2016-07-05 DIAGNOSIS — I252 Old myocardial infarction: Secondary | ICD-10-CM | POA: Diagnosis not present

## 2016-07-05 DIAGNOSIS — I130 Hypertensive heart and chronic kidney disease with heart failure and stage 1 through stage 4 chronic kidney disease, or unspecified chronic kidney disease: Secondary | ICD-10-CM | POA: Diagnosis present

## 2016-07-05 DIAGNOSIS — Z87891 Personal history of nicotine dependence: Secondary | ICD-10-CM | POA: Diagnosis not present

## 2016-07-05 DIAGNOSIS — N184 Chronic kidney disease, stage 4 (severe): Secondary | ICD-10-CM | POA: Diagnosis present

## 2016-07-05 DIAGNOSIS — Z7982 Long term (current) use of aspirin: Secondary | ICD-10-CM | POA: Diagnosis not present

## 2016-07-05 DIAGNOSIS — R0609 Other forms of dyspnea: Secondary | ICD-10-CM

## 2016-07-05 DIAGNOSIS — I2510 Atherosclerotic heart disease of native coronary artery without angina pectoris: Secondary | ICD-10-CM | POA: Diagnosis present

## 2016-07-05 DIAGNOSIS — N400 Enlarged prostate without lower urinary tract symptoms: Secondary | ICD-10-CM | POA: Diagnosis present

## 2016-07-05 DIAGNOSIS — F419 Anxiety disorder, unspecified: Secondary | ICD-10-CM | POA: Diagnosis present

## 2016-07-05 DIAGNOSIS — E1122 Type 2 diabetes mellitus with diabetic chronic kidney disease: Secondary | ICD-10-CM | POA: Diagnosis present

## 2016-07-05 DIAGNOSIS — Z79899 Other long term (current) drug therapy: Secondary | ICD-10-CM | POA: Diagnosis not present

## 2016-07-05 DIAGNOSIS — Z7902 Long term (current) use of antithrombotics/antiplatelets: Secondary | ICD-10-CM | POA: Diagnosis not present

## 2016-07-05 DIAGNOSIS — M109 Gout, unspecified: Secondary | ICD-10-CM | POA: Diagnosis present

## 2016-07-05 DIAGNOSIS — I255 Ischemic cardiomyopathy: Secondary | ICD-10-CM | POA: Diagnosis present

## 2016-07-05 LAB — CBC WITH DIFFERENTIAL/PLATELET
BASOS ABS: 0.1 10*3/uL (ref 0.0–0.1)
Basophils Relative: 2 %
EOS ABS: 0 10*3/uL (ref 0.0–0.7)
Eosinophils Relative: 0 %
HCT: 41.5 % (ref 39.0–52.0)
Hemoglobin: 13.9 g/dL (ref 13.0–17.0)
LYMPHS PCT: 34 %
Lymphs Abs: 1.6 10*3/uL (ref 0.7–4.0)
MCH: 33.8 pg (ref 26.0–34.0)
MCHC: 33.5 g/dL (ref 30.0–36.0)
MCV: 101 fL — AB (ref 78.0–100.0)
Monocytes Absolute: 0.4 10*3/uL (ref 0.1–1.0)
Monocytes Relative: 9 %
NEUTROS PCT: 55 %
Neutro Abs: 2.6 10*3/uL (ref 1.7–7.7)
Platelets: 140 10*3/uL — ABNORMAL LOW (ref 150–400)
RBC: 4.11 MIL/uL — AB (ref 4.22–5.81)
RDW: 15.4 % (ref 11.5–15.5)
WBC: 4.7 10*3/uL (ref 4.0–10.5)

## 2016-07-05 LAB — RAPID URINE DRUG SCREEN, HOSP PERFORMED
AMPHETAMINES: NOT DETECTED
Barbiturates: NOT DETECTED
Benzodiazepines: NOT DETECTED
Cocaine: NOT DETECTED
OPIATES: NOT DETECTED
Tetrahydrocannabinol: NOT DETECTED

## 2016-07-05 LAB — MRSA PCR SCREENING: MRSA BY PCR: POSITIVE — AB

## 2016-07-05 LAB — BILIRUBIN, FRACTIONATED(TOT/DIR/INDIR)
Bilirubin, Direct: 1 mg/dL — ABNORMAL HIGH (ref 0.1–0.5)
Indirect Bilirubin: 0.9 mg/dL (ref 0.3–0.9)
Total Bilirubin: 1.9 mg/dL — ABNORMAL HIGH (ref 0.3–1.2)

## 2016-07-05 LAB — FOLATE: FOLATE: 33.4 ng/mL (ref >5.9)

## 2016-07-05 MED ORDER — TIZANIDINE HCL 4 MG PO TABS: 4.0000 mg | ORAL_TABLET | Freq: Three times a day (TID) | ORAL | Status: DC | PRN

## 2016-07-05 MED ORDER — MUPIROCIN 2 % EX OINT
1.0000 "application " | TOPICAL_OINTMENT | Freq: Two times a day (BID) | CUTANEOUS | Status: DC
  Administered 2016-07-05 – 2016-07-06 (×3): 1 via NASAL
  Filled 2016-07-05: qty 22

## 2016-07-05 MED ORDER — ALLOPURINOL 300 MG PO TABS
300.0000 mg | ORAL_TABLET | Freq: Every day | ORAL | Status: DC
  Administered 2016-07-05 – 2016-07-06 (×2): 300 mg via ORAL
  Filled 2016-07-05: qty 1
  Filled 2016-07-05: qty 3

## 2016-07-05 MED ORDER — ASPIRIN EC 81 MG PO TBEC
81.0000 mg | DELAYED_RELEASE_TABLET | Freq: Every day | ORAL | Status: DC
  Administered 2016-07-05 – 2016-07-06 (×2): 81 mg via ORAL
  Filled 2016-07-05 (×2): qty 1

## 2016-07-05 MED ORDER — DOCUSATE SODIUM 100 MG PO CAPS
100.0000 mg | ORAL_CAPSULE | Freq: Two times a day (BID) | ORAL | Status: DC
  Administered 2016-07-05 – 2016-07-06 (×2): 100 mg via ORAL
  Filled 2016-07-05 (×3): qty 1

## 2016-07-05 MED ORDER — FUROSEMIDE 10 MG/ML IJ SOLN
80.0000 mg | Freq: Two times a day (BID) | INTRAMUSCULAR | Status: DC
  Administered 2016-07-05 – 2016-07-06 (×3): 80 mg via INTRAVENOUS
  Filled 2016-07-05 (×3): qty 8

## 2016-07-05 MED ORDER — ONDANSETRON HCL 4 MG/2ML IJ SOLN: 4.0000 mg | Freq: Four times a day (QID) | INTRAMUSCULAR | Status: DC | PRN

## 2016-07-05 MED ORDER — ONDANSETRON HCL 4 MG/2ML IJ SOLN
4.0000 mg | Freq: Four times a day (QID) | INTRAMUSCULAR | Status: DC | PRN
  Administered 2016-07-05: 4 mg via INTRAVENOUS
  Filled 2016-07-05: qty 2

## 2016-07-05 MED ORDER — NITROGLYCERIN 0.4 MG SL SUBL: 0.4000 mg | SUBLINGUAL_TABLET | SUBLINGUAL | Status: DC | PRN

## 2016-07-05 MED ORDER — CHLORHEXIDINE GLUCONATE CLOTH 2 % EX PADS
6.0000 | MEDICATED_PAD | Freq: Every day | CUTANEOUS | Status: DC
  Administered 2016-07-05 – 2016-07-06 (×2): 6 via TOPICAL

## 2016-07-05 MED ORDER — PAROXETINE HCL 20 MG PO TABS
20.0000 mg | ORAL_TABLET | Freq: Every day | ORAL | Status: DC
  Administered 2016-07-05 – 2016-07-06 (×2): 20 mg via ORAL
  Filled 2016-07-05 (×2): qty 1

## 2016-07-05 MED ORDER — SODIUM CHLORIDE 0.9% FLUSH
3.0000 mL | Freq: Two times a day (BID) | INTRAVENOUS | Status: DC
  Administered 2016-07-05 – 2016-07-06 (×4): 3 mL via INTRAVENOUS

## 2016-07-05 MED ORDER — ATORVASTATIN CALCIUM 80 MG PO TABS
80.0000 mg | ORAL_TABLET | Freq: Every day | ORAL | Status: DC
  Administered 2016-07-05: 80 mg via ORAL
  Filled 2016-07-05: qty 1

## 2016-07-05 MED ORDER — SODIUM CHLORIDE 0.9% FLUSH
3.0000 mL | Freq: Two times a day (BID) | INTRAVENOUS | Status: DC
  Administered 2016-07-05 – 2016-07-06 (×2): 3 mL via INTRAVENOUS

## 2016-07-05 MED ORDER — ACETAMINOPHEN 325 MG PO TABS: 650.0000 mg | ORAL_TABLET | Freq: Four times a day (QID) | ORAL | Status: DC | PRN

## 2016-07-05 MED ORDER — SENNA 8.6 MG PO TABS
1.0000 | ORAL_TABLET | Freq: Two times a day (BID) | ORAL | Status: DC
  Administered 2016-07-05 – 2016-07-06 (×2): 8.6 mg via ORAL
  Filled 2016-07-05 (×3): qty 1

## 2016-07-05 MED ORDER — PERMETHRIN 5 % EX CREA: 1.0000 "application " | TOPICAL_CREAM | CUTANEOUS | Status: DC

## 2016-07-05 MED ORDER — TRIAMCINOLONE ACETONIDE 0.1 % EX CREA: 1.0000 "application " | TOPICAL_CREAM | Freq: Two times a day (BID) | CUTANEOUS | Status: DC | PRN

## 2016-07-05 MED ORDER — POTASSIUM CHLORIDE CRYS ER 20 MEQ PO TBCR
20.0000 meq | EXTENDED_RELEASE_TABLET | Freq: Every day | ORAL | Status: DC
  Administered 2016-07-05 – 2016-07-06 (×2): 20 meq via ORAL
  Filled 2016-07-05 (×2): qty 1

## 2016-07-05 MED ORDER — HEPARIN SODIUM (PORCINE) 5000 UNIT/ML IJ SOLN
5000.0000 [IU] | Freq: Three times a day (TID) | INTRAMUSCULAR | Status: DC
  Administered 2016-07-05 – 2016-07-06 (×4): 5000 [IU] via SUBCUTANEOUS
  Filled 2016-07-05 (×4): qty 1

## 2016-07-05 MED ORDER — CARVEDILOL 3.125 MG PO TABS
3.1250 mg | ORAL_TABLET | Freq: Two times a day (BID) | ORAL | Status: DC
  Administered 2016-07-05 – 2016-07-06 (×3): 3.125 mg via ORAL
  Filled 2016-07-05 (×3): qty 1

## 2016-07-05 MED ORDER — ACETAMINOPHEN 650 MG RE SUPP: 650.0000 mg | Freq: Four times a day (QID) | RECTAL | Status: DC | PRN

## 2016-07-05 MED ORDER — SODIUM CHLORIDE 0.9% FLUSH: 3.0000 mL | INTRAVENOUS | Status: DC | PRN

## 2016-07-05 MED ORDER — ADULT MULTIVITAMIN W/MINERALS CH
1.0000 | ORAL_TABLET | Freq: Every day | ORAL | Status: DC
  Administered 2016-07-05 – 2016-07-06 (×2): 1 via ORAL
  Filled 2016-07-05 (×2): qty 1

## 2016-07-05 MED ORDER — SODIUM CHLORIDE 0.9 % IV SOLN: 250.0000 mL | INTRAVENOUS | Status: DC | PRN

## 2016-07-05 MED ORDER — ACETAMINOPHEN 325 MG PO TABS: 650.0000 mg | ORAL_TABLET | ORAL | Status: DC | PRN

## 2016-07-05 MED ORDER — ONDANSETRON HCL 4 MG PO TABS: 4.0000 mg | ORAL_TABLET | Freq: Four times a day (QID) | ORAL | Status: DC | PRN

## 2016-07-05 MED ORDER — TAMSULOSIN HCL 0.4 MG PO CAPS
0.4000 mg | ORAL_CAPSULE | Freq: Every day | ORAL | Status: DC
  Administered 2016-07-05 – 2016-07-06 (×2): 0.4 mg via ORAL
  Filled 2016-07-05 (×2): qty 1

## 2016-07-05 MED ORDER — FAMOTIDINE 20 MG PO TABS
20.0000 mg | ORAL_TABLET | Freq: Two times a day (BID) | ORAL | Status: DC
  Administered 2016-07-05 – 2016-07-06 (×3): 20 mg via ORAL
  Filled 2016-07-05 (×3): qty 1

## 2016-07-05 NOTE — H&P (Signed)
Date: 07/05/2016               Patient Name:  [name] MRN: [id number]  DOB: 14-Feb-1956 Age / Sex: 60 y.o., male   PCP: [name], MD         Medical Service: Internal Medicine Teaching Service         Attending Physician: Dr. Norval Morton, MD    First Contact: Dr. Lovena Le  Pager: [phone]  Second Contact: Dr. Marlowe Sax Pager: [phone]       After Hours (After 5p/  First Contact Pager: [phone]  weekends / holidays): Second Contact Pager: [phone]   Chief Complaint: shortness of breath   History of Present Illness: [name] is a 60 y.o. male with a PMH of chronic non ischemic HFrEF, HTN, diet controlled DM, CKD stage 3, anxiety, hx of cocaine use and alcohol abuse  who presents with shortness of breath. On 11/14 he was feeling great and felt like he had been cured so he stopped taking all of his medications. On Sunday he developed shortness of breath, non productive cough, chest tightness., abdominal distension, orthopnea, abdominal pain, nausea and decreased appetite, and diarrhea with painful hematochezia. He had a normal BM yesterday. He denies lightheadedness or chest pain.   Of note, he was recently admitted for syncope 11/28- 11/30 for orthostatic hypotension causing syncope. He had been started on entresto 4 months prior to that admission and this was thought to be related to his orthostatic HTN so it was stopped.   In the ED vitals were HR 100, Bp 128/65. Labs revealed BNP 4500, trop 0.03, crt 2.15, BUN 36, and tbili 2.7. CXR showed small right pleural effusion. Was given IV Lasix 80 mg and admitted for acute on chronic CHF exacerbation.  Meds:  Current Meds  Medication Sig  . albuterol (PROVENTIL HFA;VENTOLIN HFA) 108 (90 Base) MCG/ACT inhaler Inhale 1-2 puffs into the lungs every 6 (six) hours as needed for wheezing or shortness of breath.  . allopurinol (ZYLOPRIM) 300 MG tablet Take 300 mg by mouth daily.  Marland Kitchen aspirin EC 81 MG EC tablet Take 1 tablet  (81 mg total) by mouth daily.  Marland Kitchen atorvastatin (LIPITOR) 80 MG tablet Take 80 mg by mouth daily.  . carvedilol (COREG) 3.125 MG tablet Take 1 tablet (3.125 mg total) by mouth 2 (two) times daily with a meal. Discontinue 6.25mg  (Patient taking differently: Take 3.125 mg by mouth 2 (two) times daily with a meal. )  . clopidogrel (PLAVIX) 75 MG tablet Take 75 mg by mouth daily.  . furosemide (LASIX) 80 MG tablet Take 80 mg by mouth 2 (two) times daily.  . Multiple Vitamin (MULTIVITAMIN WITH MINERALS) TABS tablet Take 1 tablet by mouth daily.  . nitroGLYCERIN (NITROSTAT) 0.4 MG SL tablet Place 1 tablet (0.4 mg total) under the tongue every 5 (five) minutes x 3 doses as needed for chest pain.  Marland Kitchen PARoxetine (PAXIL) 20 MG tablet Take 1 tablet (20 mg total) by mouth daily.  . permethrin (ELIMITE) 5 % cream Apply 1 application topically See admin instructions. Apply on the skin from the neck down at bedtime  . potassium chloride SA (K-DUR,KLOR-CON) 20 MEQ tablet Take 20 mEq by mouth daily.  . ranitidine (ZANTAC) 150 MG tablet Take 1 tablet (150 mg total) by mouth 2 (two) times daily.  . tamsulosin (FLOMAX) 0.4 MG CAPS capsule Take 0.4 mg by mouth daily.  Marland Kitchen tiZANidine (ZANAFLEX) 4 MG tablet TAKE 1 TABLET  BY MOUTH EVERY 8 HOURS AS NEEDED FOR MUSCLE SPASMS. (Patient taking differently: Take 4 mg by mouth every 8 (eight) hours as needed for muscle spasms. )  . triamcinolone cream (KENALOG) 0.1 % Apply 1 application topically 2 (two) times daily as needed (itching).      Allergies: Allergies as of 07/04/2016  . (No Known Allergies)   Past Medical History:  Diagnosis Date  . Anxiety   . Arthritis    "all over" (01/07/2015)  . CKD (chronic kidney disease), stage IV ([place])   . Depression   . Headache    "probably weekly" (01/07/2015)  . Heart attack 07/2014  . History of echocardiogram 12/2014   EF 20-25%  . Homelessness   . Hypercholesterolemia   . Hypertension   . Ischemic dilated cardiomyopathy ([place])    . Noncompliance   . NSTEMI (non-ST elevated myocardial infarction) ([place]) 01/07/2015  . Polysubstance abuse    etoh, cocaine  . Urinary hesitancy   . Walking pneumonia 07/2014   Family History:  Brother - heart disease   Social History: Quit smoking cigarettes in 2005, smoked 1PPD for 30 years. Was previously a heavy drinker, quit drinking 1.5 years ago. Quit using cocaine 1 year ago.   Review of Systems: A complete ROS was negative except as per HPI.   Physical Exam: Blood pressure 130/70, pulse (!) 106, temperature 98.1 F (36.7 C), temperature source Oral, resp. rate (!) 24, height 5\' 8"  (1.727 m), weight 162 lb 3.2 oz (73.6 kg), SpO2 100 %. Physical Exam  Constitutional: He is oriented to person, place, and time. He appears well-developed and well-nourished. No distress.  HENT:  Head: Normocephalic and atraumatic.  Eyes: Conjunctivae are normal. Scleral icterus is present.  Cardiovascular: Normal rate and regular rhythm.   No murmur heard. Pulmonary/Chest: Effort normal. No respiratory distress. He has no wheezes. He has no rales.  Abdominal: Soft. Bowel sounds are normal. He exhibits no distension. There is guarding.  Neurological: He is alert and oriented to person, place, and time.  Skin: Skin is warm and dry. He is not diaphoretic.  Psychiatric: He has a normal mood and affect. His behavior is normal.   EKG: sinus with PVC. RBBB. T wave inversion V1-V3, unchanged from prior   CXR: Personal review of the chest xray reveals right pleural effusion.   Assessment & Plan by Problem: Active Problems:   Acute heart failure (HCC)   CHF exacerbation (HCC)  Acute on chronic HFrEF exacerbation   Echo 04/2015 EF 25-30% . Presentation may be related to CHF exacerbation in the setting of medication non compliance. He has a poor understanding of medication necesity and would benefit from further counseling and a detailed conversation about when to take each of his medications.    Home medication is lasix 80 mg BID and kdur 20 meq daily  Ordered IV lasix 80 mg BID   Hx NSTEMI  NSTEMI in the setting of cocaine use, home medication list includes asa and plavix. Do not see the indication for DATP therapy at this time, patient continued to have plavix and had lower hematochezia earlier this week.  Ordered home medication asa 81 mg daily, atorvastatin 80 mg daily, carvedilol 3.125 mg BID D/ced plavix 75 mg daily Would benefit from pharmacy consult to address his home medication list  Painful hematochezia  Resolved after 4 episodes 2 days ago.  Ordered FOBT   Acute on chronic kidney disease stage 3  Crt 2.15, baseline Crt 1.8.  Elevated Bilirubin  Nausea, decreased appetite, abdominal pain, and diarrhea. Abdominal tenderness on exam. Unclear if he has had workup for chronically elevated tbili in the past.  Consider RUQ ultrasound   Gout  Ordered home medication allopurinol 300 mg daily   Anxiety  Ordered home medication paroxetine 20 mg daily   DVT Ppx none at this time, will follow up FOBT  Code Status FULL   Dispo: Admit patient to Inpatient with expected length of stay greater than 2 midnights.  Signed: Ledell Noss, MD 07/05/2016, 6:47 AM  Pager: [phone]

## 2016-07-05 NOTE — Progress Notes (Signed)
Patient arrived to unit via bed, transferred to room by walking with cane. Blood pressure elevated, complained on abdominal pain from coughing. Patient oriented to unit, call bell in reach.

## 2016-07-05 NOTE — Progress Notes (Signed)
Notified attending physician c/o feeling anxious and nauseated.  Informed pt that I had already given scheduled Paxil at [phone].  Gave PRN Zofran IV.  MD placed orders for urine drug screen STAT.  Pt states that he cannot void now, but will try later.  Also, informed MD that I thought pt was presenting with signs of withdrawal.

## 2016-07-05 NOTE — ED Notes (Signed)
Dr. Smith at bedside.

## 2016-07-05 NOTE — Progress Notes (Signed)
PT Cancellation Note  Patient Details Name: [name] MRN: [id number] DOB: Oct 21, 1955   Cancelled Treatment:    Reason Eval/Treat Not Completed: Other (comment). Pt reports he has been ambulating in room and that he can't attempt further mobility due to SOB and that he had a lot of things going on. Will recheck tomorrow.   [place] 07/05/2016, 4:12 PM [place]

## 2016-07-05 NOTE — Progress Notes (Signed)
   Subjective: Patient having some anxiety. Says his breathing is better. He knows that he should not have stopped taking his medications.   Objective:  Vital signs in last 24 hours: Vitals:   07/05/16 0135 07/05/16 0145 07/05/16 0646 07/05/16 1200  BP: (!) 138/112 130/70 125/88 106/87  Pulse: (!) 106  (!) 106 88  Resp: (!) 24  18 20   Temp: 98.1 F (36.7 C)  97.5 F (36.4 C) 97.9 F (36.6 C)  TempSrc: Oral  Oral Oral  SpO2: 100%  100% 98%  Weight: 162 lb 3.2 oz (73.6 kg)     Height: 5\' 8"  (1.727 m)      Physical Exam  Constitutional: He is oriented to person, place, and time. He appears well-developed and well-nourished.  HENT:  Head: Normocephalic and atraumatic.  JVD to the mandibular angle  Cardiovascular: Normal rate and regular rhythm.  Exam reveals no gallop and no friction rub.   No murmur heard. Respiratory: Breath sounds normal.  GI: Soft. Bowel sounds are normal. He exhibits no distension. There is no tenderness.  No tenderness to palpation  Musculoskeletal: He exhibits no edema.  Neurological: He is alert and oriented to person, place, and time.     Assessment/Plan:  Principal Problem:   Acute on chronic systolic heart failure (HCC) Active Problems:   CKD (chronic kidney disease) stage 3, GFR 30-59 ml/min   Hyperbilirubinemia   CHF exacerbation (HCC)   Macrocytosis without anemia  # Acute on chronic HFrEF exacerbation  # Hx of CAD Patient comes in with shortness of breath and elevated BNP and volume overload in the setting of medication noncompliance. -- Furosemide 80 mg IV twice a day -- Aspirin 81 mg once daily -- Atorvastatin 80 mg once daily -- Carvedilol 3.125 mg twice a day  # Acute on chronic kidney disease stage III Creatinine 2.15 on admission. Baseline creatinine approximately 1.8. -- Monitor closely with IV diuresis  # Hyperbilirubinemia Patient with a mild hyperbilirubinemia. Liver function tests are otherwise normal. He is  asymptomatic. One potential diagnosis in a patient with chronic hyperbilirubinemia would be underlying Gilbert's syndrome. -- Measured direct and indirect bilirubin  # Macrocytosis without anemia The patient has a macrocytosis and no anemia. Vitamin B12 was normal. He was previously an alcoholic but currently denies use. We will check folate levels. -- Folate level  Dispo: Anticipated discharge in approximately tomorrow.  Ophelia Shoulder, MD 07/05/2016, 4:05 PM Pager: [phone]

## 2016-07-06 DIAGNOSIS — N400 Enlarged prostate without lower urinary tract symptoms: Secondary | ICD-10-CM

## 2016-07-06 DIAGNOSIS — I5023 Acute on chronic systolic (congestive) heart failure: Secondary | ICD-10-CM

## 2016-07-06 LAB — COMPREHENSIVE METABOLIC PANEL
ALBUMIN: 3.3 g/dL — AB (ref 3.5–5.0)
ALK PHOS: 94 U/L (ref 38–126)
ALT: 15 U/L — ABNORMAL LOW (ref 17–63)
ANION GAP: 9 (ref 5–15)
AST: 21 U/L (ref 15–41)
BUN: 40 mg/dL — ABNORMAL HIGH (ref 6–20)
CALCIUM: 9.4 mg/dL (ref 8.9–10.3)
CO2: 24 mmol/L (ref 22–32)
Chloride: 104 mmol/L (ref 101–111)
Creatinine, Ser: 2.4 mg/dL — ABNORMAL HIGH (ref 0.61–1.24)
GFR calc Af Amer: 32 mL/min — ABNORMAL LOW (ref >60)
GFR calc non Af Amer: 27 mL/min — ABNORMAL LOW (ref >60)
GLUCOSE: 102 mg/dL — AB (ref 65–99)
Potassium: 4.6 mmol/L (ref 3.5–5.1)
SODIUM: 137 mmol/L (ref 135–145)
Total Bilirubin: 1.9 mg/dL — ABNORMAL HIGH (ref 0.3–1.2)
Total Protein: 5.5 g/dL — ABNORMAL LOW (ref 6.5–8.1)

## 2016-07-06 LAB — CBC
HEMATOCRIT: 37.8 % — AB (ref 39.0–52.0)
HEMOGLOBIN: 12.6 g/dL — AB (ref 13.0–17.0)
MCH: 33.7 pg (ref 26.0–34.0)
MCHC: 33.3 g/dL (ref 30.0–36.0)
MCV: 101.1 fL — ABNORMAL HIGH (ref 78.0–100.0)
Platelets: 116 10*3/uL — ABNORMAL LOW (ref 150–400)
RBC: 3.74 MIL/uL — ABNORMAL LOW (ref 4.22–5.81)
RDW: 15.2 % (ref 11.5–15.5)
WBC: 4 10*3/uL (ref 4.0–10.5)

## 2016-07-06 NOTE — Evaluation (Signed)
Physical Therapy Evaluation Patient Details Name: [name] MRN: [id number] DOB: 12/14/1955 Today's Date: 07/06/2016   History of Present Illness  59 yo male with PMH of non ischemic HFrEF (EF 20-25%), ETOH and cocaine abuse, HTN, CKD Stage 3, who presented with increased SOB.  Pt with CHF exacerbation.  Clinical Impression  Patient seen for mobility evaluation at this time. Patient mobilizing well using SPC. Tolerated increased activity, balance assessment and higher level balance tasks without any significant deficits noted. Anticipate patient will be safe for d/c home without any further PT needs at this time. Acute PT to sign off. Recommend continued mobility ad lib.  OF NOTE: HR and O2 saturations stable throughout session    Follow Up Recommendations No PT follow up    Equipment Recommendations  None recommended by PT    Recommendations for Other Services       Precautions / Restrictions Precautions Precautions: None Restrictions Weight Bearing Restrictions: No      Mobility  Bed Mobility Overal bed mobility: Modified Independent                Transfers Overall transfer level: Modified independent                  Ambulation/Gait Ambulation/Gait assistance: Modified independent (Device/Increase time) Ambulation Distance (Feet): 520 Feet Assistive device: Straight cane Gait Pattern/deviations: WFL(Within Functional Limits)   Gait velocity interpretation: at or above normal speed for age/gender General Gait Details: no overt LOB at this time, ambulating with good speed and stability using SPC  Stairs Stairs: Yes Stairs assistance: Modified independent (Device/Increase time) Stair Management: One rail Right Number of Stairs: 3 General stair comments: no difficulty with stair negotation, reciprical step pattern  Wheelchair Mobility    Modified Rankin (Stroke Patients Only)       Balance Overall balance assessment: Modified  Independent                               Standardized Balance Assessment Standardized Balance Assessment : Dynamic Gait Index   Dynamic Gait Index Level Surface: Normal Change in Gait Speed: Normal Gait with Horizontal Head Turns: Normal Gait with Vertical Head Turns: Normal Gait and Pivot Turn: Mild Impairment Step Over Obstacle: Mild Impairment Step Around Obstacles: Normal Steps: Mild Impairment Total Score: 21       Pertinent Vitals/Pain Pain Assessment: No/denies pain    Home Living Family/patient expects to be discharged to:: Private residence Living Arrangements: Alone Available Help at Discharge: Friend(s);Available PRN/intermittently Type of Home: Apartment Home Access: Elevator     Home Layout: One level Home Equipment: Cane - single point      Prior Function Level of Independence: Independent with assistive device(s)               Hand Dominance   Dominant Hand: Right    Extremity/Trunk Assessment   Upper Extremity Assessment Upper Extremity Assessment: Overall WFL for tasks assessed    Lower Extremity Assessment Lower Extremity Assessment: Overall WFL for tasks assessed    Cervical / Trunk Assessment Cervical / Trunk Assessment: Normal  Communication   Communication: No difficulties  Cognition Arousal/Alertness: Awake/alert Behavior During Therapy: WFL for tasks assessed/performed Overall Cognitive Status: No family/caregiver present to determine baseline cognitive functioning (most likely impaired memory and processing at baseline)                      General Comments  Exercises     Assessment/Plan    PT Assessment Patent does not need any further PT services  PT Problem List            PT Treatment Interventions      PT Goals (Current goals can be found in the Care Plan section)  Acute Rehab PT Goals Patient Stated Goal: to go home PT Goal Formulation: All assessment and education complete,  DC therapy    Frequency     Barriers to discharge        Co-evaluation               End of Session Equipment Utilized During Treatment: Gait belt Activity Tolerance: Patient tolerated treatment well Patient left: in chair;with call bell/phone within reach Nurse Communication: Mobility status         Time: 1135-1151 PT Time Calculation (min) (ACUTE ONLY): 16 min   Charges:   PT Evaluation $PT Eval Low Complexity: 1 Procedure     PT G Codes:        Duncan Dull 2016/07/28, 1:54 PM Alben Deeds, [place] DPT  [phone]

## 2016-07-06 NOTE — Progress Notes (Signed)
As per physician's request, I informed/educated pt that he has an appt with [facility] on 1.2.18, @ 7:45AM, at phone number [phone].  I also wrote this information on his discharge paperwork to remind him.

## 2016-07-06 NOTE — Discharge Summary (Signed)
Name: [name] MRN: [id number] DOB: May 12, 1956 60 y.o. PCP: [name], MD  Date of Admission: 07/04/2016  4:29 PM Date of Discharge: 07/06/2016 Attending Physician: Lucious Groves, DO  Discharge Diagnosis: 1. Acute on chronic systolic heart failure 2. Hyperbilirubinemia 3. Urology follow-up Principal Problem:   Acute on chronic systolic heart failure (HCC) Active Problems:   CKD (chronic kidney disease) stage 3, GFR 30-59 ml/min   Hyperbilirubinemia   CHF exacerbation (HCC)   Macrocytosis without anemia   Discharge Medications: Allergies as of 07/06/2016   No Known Allergies     Medication List    TAKE these medications   albuterol 108 (90 Base) MCG/ACT inhaler Commonly known as:  PROVENTIL HFA;VENTOLIN HFA Inhale 1-2 puffs into the lungs every 6 (six) hours as needed for wheezing or shortness of breath.   allopurinol 300 MG tablet Commonly known as:  ZYLOPRIM Take 300 mg by mouth daily.   aspirin 81 MG EC tablet Take 1 tablet (81 mg total) by mouth daily.   atorvastatin 80 MG tablet Commonly known as:  LIPITOR Take 80 mg by mouth daily.   carvedilol 3.125 MG tablet Commonly known as:  COREG Take 1 tablet (3.125 mg total) by mouth 2 (two) times daily with a meal. Discontinue 6.25mg  What changed:  additional instructions   clopidogrel 75 MG tablet Commonly known as:  PLAVIX Take 75 mg by mouth daily.   furosemide 80 MG tablet Commonly known as:  LASIX Take 80 mg by mouth 2 (two) times daily. What changed:  Another medication with the same name was removed. Continue taking this medication, and follow the directions you see here.   multivitamin with minerals Tabs tablet Take 1 tablet by mouth daily.   nitroGLYCERIN 0.4 MG SL tablet Commonly known as:  NITROSTAT Place 1 tablet (0.4 mg total) under the tongue every 5 (five) minutes x 3 doses as needed for chest pain.   PARoxetine 20 MG tablet Commonly known as:  PAXIL Take 1 tablet (20 mg  total) by mouth daily.   permethrin 5 % cream Commonly known as:  ELIMITE Apply 1 application topically See admin instructions. Apply on the skin from the neck down at bedtime   potassium chloride SA 20 MEQ tablet Commonly known as:  K-DUR,KLOR-CON Take 20 mEq by mouth daily.   ranitidine 150 MG tablet Commonly known as:  ZANTAC Take 1 tablet (150 mg total) by mouth 2 (two) times daily.   tamsulosin 0.4 MG Caps capsule Commonly known as:  FLOMAX Take 0.4 mg by mouth daily.   tiZANidine 4 MG tablet Commonly known as:  ZANAFLEX TAKE 1 TABLET BY MOUTH EVERY 8 HOURS AS NEEDED FOR MUSCLE SPASMS. What changed:  how much to take  how to take this  when to take this  reasons to take this  additional instructions   triamcinolone cream 0.1 % Commonly known as:  KENALOG Apply 1 application topically 2 (two) times daily as needed (itching).       Disposition and follow-up:   [name] was discharged from [facility] in Good condition.  At the hospital follow up visit please address:  1.  Please ensure the patient has seen urology regarding his BPH.  2.  Labs / imaging needed at time of follow-up: None  3.  Pending labs/ test needing follow-up: None  Follow-up Appointments: Follow-up Information    [name], MD.   Specialty:  Family Medicine Why:  Office request for patient to  call to schedule follow up appt  Contact information: [address] [place] [address] [phone]           He also has an appointment scheduled with [facility] for 07/19/2016 at [facility] Course by problem list: Principal Problem:   Acute on chronic systolic heart failure (HCC) Active Problems:   CKD (chronic kidney disease) stage 3, GFR 30-59 ml/min   Hyperbilirubinemia   CHF exacerbation (HCC)   Macrocytosis without anemia   1. Acute on chronic systolic heart failure exacerbation Patient presented to the [facility]  emergency department on 07/04/2016 with increased shortness of breath, dyspnea on exertion and paroxysmal nocturnal dyspnea. He was also complaining of a mild nonproductive cough. The patient states that he stopped taking all his medications 4 days prior because he was feeling well and was not having any symptoms. In the emergency department the patient was afebrile and hemodynamically stable. Labs revealed an elevated BNP at 4500 and a creatinine of 2.15. A chest x-ray showed a small right pleural effusion. He was given IV Lasix 80 mg and admitted for acute on chronic congestive heart failure exacerbation. Once on service the patient received 3 additional IV doses of furosemide. He diuresed appropriately. On the day of discharge the patient was no longer having any shortness of breath or cough. He was able to lay flat to sleep. He states that he was feeling better than he had in several prior weeks. On his examination he did not have jugular venous distention, or peripheral edema. His lungs were clear to auscultation bilaterally. We stressed medication compliance with him and he will be discharged on his current heart failure medications.  2. Isolated hyperbilirubinemia The patient has had an elevated isolated hyperbilirubinemia chronically. While inpatient his bilirubin remained elevated. Bilirubin studies were ordered and it appears that his direct bilirubin is more elevated compared with this indirect. The exact etiology of the patient's isolated hyperbilirubinemia is unclear at this time. He may benefit from further monitoring of this in the outpatient setting.  3. Urology follow-up The patient has a history of BPH which is treated with tamsulosin. Even on this medication he continues to have significant problems with BPH. He was recently admitted for congestive heart failure exacerbation and was also noted to have dizziness and orthostatic hypotension at that time. During this admission it was thought  that the symptoms were secondary to entresto and this medication was discontinued. However, tamsulosin has also been associated with dizziness and orthostatic hypotension and it is unclear whether it was the tamsulosin or entresto causing the patient's symptoms. I would like for him to be evaluated by urology to see if a potential transurethral resection of prostate would be appropriate to treat his BPH. If this procedure is appropriate for this patient he may be able to discontinue the use of tamsulosin and retry entresto. In following evidence based guidelines for congestive heart failure entresto in randomized controlled clinical trials have shown a mortality benefit and I think trying to start this medication back in this patient would be extremely beneficial to his overall picture.   Discharge Vitals:   BP (!) 109/50 (BP Location: Left Arm)   Pulse 100   Temp 97.4 F (36.3 C) (Oral)   Resp 18   Ht 5\' 8"  (1.727 m)   Wt 167 lb 3.2 oz (75.8 kg)   SpO2 97%   BMI 25.42 kg/m   Pertinent Labs, Studies, and Procedures:  1. Elevated BNP 2.  Small right pleural effusion diagnosed on chest radiography  Discharge Instructions: Discharge Instructions    Diet - low sodium heart healthy    Complete by:  As directed    Discharge instructions    Complete by:  As directed    We have treated you for an exacerbation of heart failure. You developed shortness of breath because you stopped taking your medications.  Please continue to take your medications as prescribed. One medication called Lasix you need to take every single day to avoid becoming short of breath in the future. It is important that you also take your other medications even on days when you feel well because these will help prevent you from feeling short of breath.  Please make an appointment to see your primary care physician. Also, please make an appointment to see a urologist to discuss your prostate.  Again, you will continue to feel  well as long as he takes her medications. Please continue to take them as prescribed.   Increase activity slowly    Complete by:  As directed       Signed: Ophelia Shoulder, MD 07/06/2016, 1:50 PM   Pager: [phone]

## 2016-07-06 NOTE — Progress Notes (Addendum)
Occupational Therapy Evaluation Patient Details Name: [name] MRN: [id number] DOB: [birth year] Today's Date: 07/06/2016    History of Present Illness 60 yo male with PMH of non ischemic HFrEF (EF 20-25%), ETOH and cocaine abuse, HTN, CKD Stage 3, who presented with increased SOB.  Pt with CHF exacerbation.   Clinical Impression   Pt at baseline regarding his ADL level. Discussed medication management with pt.Pt states he doesn't understand all the medications he is suppose to take and sometimes can't remember if he has taken them or not. Recommend pt using a daily (am noon pm) pill box and set his phone alarm at the designated times as external cue to take his medication. Will discuss medication management with nursing. Left pager number for nursing to call. Pt needs assistance setting his phone alarm to coordinate with medication times to increase his compliance. Pt also needs a simplified list of medications, purpose of meds and dosage.  Recommend follow up with [facility] nursing to help with medication management to increase compliance and reduce risk of readmission.     Follow Up Recommendations  Other (comment) ([facility] nursing for medication management)    Equipment Recommendations  None recommended by OT    Recommendations for Other Services       Precautions / Restrictions Precautions Precautions: None Restrictions Weight Bearing Restrictions: No      Mobility Bed Mobility Overal bed mobility: Modified Independent                Transfers Overall transfer level: Modified independent                    Balance Overall balance assessment: No apparent balance deficits (not formally assessed)                                          ADL Overall ADL's : At baseline                                       General ADL Comments: Pt requries A for medication management. Recommend pt set alarm on cell phone to ge off daily as  reminder to take medicaiton form pill box     Vision     Perception     Praxis      Pertinent Vitals/Pain Pain Assessment: No/denies pain     Hand Dominance Right   Extremity/Trunk Assessment Upper Extremity Assessment Upper Extremity Assessment: Overall WFL for tasks assessed   Lower Extremity Assessment Lower Extremity Assessment: Defer to PT evaluation   Cervical / Trunk Assessment Cervical / Trunk Assessment: Normal   Communication Communication Communication: No difficulties   Cognition Arousal/Alertness: Awake/alert Behavior During Therapy: WFL for tasks assessed/performed Overall Cognitive Status: No family/caregiver present to determine baseline cognitive functioning (most likely impaired memory and processing at baseline)                     General Comments   Attempted to discuss with nsg. She asked to have my pager number so she could discuss when she wasn't so busy.     Exercises       Shoulder Instructions      Home Living Family/patient expects to be discharged to:: Private residence Living Arrangements: Alone Available Help at Discharge: Friend(s);Available PRN/intermittently Type  of Home: Apartment Home Access: Elevator     Home Layout: One level     Bathroom Shower/Tub: Tub/shower unit Shower/tub characteristics: Curtain Biochemist, clinical: Handicapped height Bathroom Accessibility: Yes   Home Equipment: [place] - single point          Prior Functioning/Environment Level of Independence: Independent with assistive device(s)                 OT Problem List: Decreased cognition;Cardiopulmonary status limiting activity   OT Treatment/Interventions:      OT Goals(Current goals can be found in the care plan section) Acute Rehab OT Goals Patient Stated Goal: to go home OT Goal Formulation: All assessment and education complete, DC therapy  OT Frequency:     Barriers to D/C:            Co-evaluation               End of Session Equipment Utilized During Treatment: Other (comment) (cane) Nurse Communication: Other (comment) (medication education)  Activity Tolerance: Patient tolerated treatment well Patient left: Other (comment) (with PT)   Time: 0160-1093 OT Time Calculation (min): 13 min Charges:  OT General Charges $OT Visit: 1 Procedure OT Evaluation $OT Eval Low Complexity: 1 Procedure G-Codes:    Marlene Pfluger,[place] 07/31/2016, 11:50 AM   Maurie Boettcher, OT/L  [phone] 31-Jul-2016

## 2016-07-06 NOTE — Progress Notes (Signed)
OT Cancellation Note  Patient Details Name: Marquelle Musgrave MRN: [id number] DOB: [birth year]   Cancelled Treatment:    Reason Eval/Treat Not Completed: Other (comment);Patient at procedure or test/ unavailable Nsg giving medications. Will attempt to return later.  Samentha Perham,[place] 07/06/2016, 10:34 AM

## 2016-07-06 NOTE — Progress Notes (Signed)
   Subjective: Patient much improved. Shortness of breath has improved. He was able to lay flat to sleep last night. Says he is feeling much better today compared with the last few weeks. He is ready and wants to return home today.   Objective:  Vital signs in last 24 hours: Vitals:   07/05/16 0646 07/05/16 1200 07/05/16 1942 07/06/16 0424  BP: 125/88 106/87 107/85 104/83  Pulse: (!) 106 88 84 89  Resp: 18 20 18 18   Temp: 97.5 F (36.4 C) 97.9 F (36.6 C) 98.2 F (36.8 C) 97.9 F (36.6 C)  TempSrc: Oral Oral Oral Oral  SpO2: 100% 98% 99% 99%  Weight:    167 lb 3.2 oz (75.8 kg)  Height:       Physical Exam  Constitutional: He is oriented to person, place, and time. He appears well-developed and well-nourished.  HENT:  Head: Normocephalic and atraumatic.  NO JVD  Cardiovascular: Normal rate and regular rhythm.  Exam reveals no gallop and no friction rub.   No murmur heard. Respiratory: Breath sounds normal.  GI: Soft. Bowel sounds are normal. He exhibits no distension. There is no tenderness.  No tenderness to palpation  Musculoskeletal: He exhibits no edema.  Neurological: He is alert and oriented to person, place, and time.     Assessment/Plan:  Principal Problem:   Acute on chronic systolic heart failure (HCC) Active Problems:   CKD (chronic kidney disease) stage 3, GFR 30-59 ml/min   Hyperbilirubinemia   CHF exacerbation (HCC)   Macrocytosis without anemia  Clinically much improved. No shortness of breath and JVD resolved. Patient will be discharged home today. He would benefit with follow-up with urology to discuss potential transurethral resection of prostate for benign prostatic hypertrophy. Currently, he is on tamsulosin which has been associated with orthostatic hypotension and dizziness. He was recently discontinued from taking entresto secondary to orthostatic hypotension and dizziness. It is unclear whether the cause of his symptoms are secondary to entresto or  potentially side effects of tamsulosin. He would benefit from urology follow-up to see if a procedure could be done to relieve the symptoms of BPH and restarting entresto if medically able.  # Acute on chronic HFrEF exacerbation  # Hx of CAD Patient comes in with shortness of breath and elevated BNP and volume overload in the setting of medication noncompliance. -- Furosemide 80 mg IV twice a day -- Aspirin 81 mg once daily -- Atorvastatin 80 mg once daily -- Carvedilol 3.125 mg twice a day  # Acute on chronic kidney disease stage III Creatinine 2.15 on admission. Baseline creatinine approximately 1.8. -- Monitor closely with IV diuresis  # Hyperbilirubinemia Patient with a mild hyperbilirubinemia. Liver function tests are otherwise normal. He is asymptomatic. One potential diagnosis in a patient with chronic hyperbilirubinemia would be underlying Gilbert's syndrome. -- Direct bilirubin elevated, indirect bilirubin normal. These lab abnormalities do not favor Gilberts syndrome. Etiology of the patient's isolated hyperbilirubinemia are unclear and he may benefit from ongoing outpatient follow-up.  # Macrocytosis without anemia The patient has a macrocytosis and no anemia. Vitamin B12 was normal. He was previously an alcoholic but currently denies use. We will check folate levels. -- Folate level and B12 WNL  Dispo: Anticipated discharge today.  Ophelia Shoulder, MD 07/06/2016, 9:15 AM Pager: [phone]

## 2016-07-06 NOTE — Progress Notes (Signed)
Orders received for pt discharge.  Discharge summary printed and reviewed with pt.  Explained medication regimen, and pt had no further questions at this time.  IV removed and site remains clean, dry, intact.  Telemetry removed.  Pt in stable condition and awaiting transport. 

## 2016-07-15 ENCOUNTER — Telehealth: Payer: Self-pay | Admitting: Family Medicine

## 2016-07-15 NOTE — Telephone Encounter (Signed)
[name] from [facility] calling with concerns about adverse effects of the following medications:   Aspirin EC 81 MG Clopidogrel 75 MG Paroxetine 20 MG

## 2016-07-19 ENCOUNTER — Encounter: Payer: Self-pay | Admitting: Family Medicine

## 2016-07-19 ENCOUNTER — Ambulatory Visit: Payer: Medicaid Other | Attending: Family Medicine | Admitting: Family Medicine

## 2016-07-19 VITALS — BP 90/66 | HR 93 | Temp 97.3°F | Ht 68.0 in | Wt 167.0 lb

## 2016-07-19 DIAGNOSIS — I420 Dilated cardiomyopathy: Secondary | ICD-10-CM | POA: Insufficient documentation

## 2016-07-19 DIAGNOSIS — F329 Major depressive disorder, single episode, unspecified: Secondary | ICD-10-CM | POA: Diagnosis not present

## 2016-07-19 DIAGNOSIS — K529 Noninfective gastroenteritis and colitis, unspecified: Secondary | ICD-10-CM | POA: Insufficient documentation

## 2016-07-19 DIAGNOSIS — I252 Old myocardial infarction: Secondary | ICD-10-CM | POA: Insufficient documentation

## 2016-07-19 DIAGNOSIS — R0981 Nasal congestion: Secondary | ICD-10-CM | POA: Insufficient documentation

## 2016-07-19 DIAGNOSIS — Z9114 Patient's other noncompliance with medication regimen: Secondary | ICD-10-CM | POA: Insufficient documentation

## 2016-07-19 DIAGNOSIS — I959 Hypotension, unspecified: Secondary | ICD-10-CM | POA: Insufficient documentation

## 2016-07-19 DIAGNOSIS — R112 Nausea with vomiting, unspecified: Secondary | ICD-10-CM | POA: Diagnosis present

## 2016-07-19 DIAGNOSIS — I9589 Other hypotension: Secondary | ICD-10-CM

## 2016-07-19 DIAGNOSIS — E7800 Pure hypercholesterolemia, unspecified: Secondary | ICD-10-CM | POA: Insufficient documentation

## 2016-07-19 DIAGNOSIS — I255 Ischemic cardiomyopathy: Secondary | ICD-10-CM | POA: Insufficient documentation

## 2016-07-19 DIAGNOSIS — M109 Gout, unspecified: Secondary | ICD-10-CM | POA: Insufficient documentation

## 2016-07-19 DIAGNOSIS — Z7982 Long term (current) use of aspirin: Secondary | ICD-10-CM | POA: Diagnosis not present

## 2016-07-19 DIAGNOSIS — I5042 Chronic combined systolic (congestive) and diastolic (congestive) heart failure: Secondary | ICD-10-CM | POA: Diagnosis not present

## 2016-07-19 DIAGNOSIS — N184 Chronic kidney disease, stage 4 (severe): Secondary | ICD-10-CM | POA: Insufficient documentation

## 2016-07-19 DIAGNOSIS — F419 Anxiety disorder, unspecified: Secondary | ICD-10-CM | POA: Diagnosis not present

## 2016-07-19 DIAGNOSIS — N183 Chronic kidney disease, stage 3 unspecified: Secondary | ICD-10-CM

## 2016-07-19 DIAGNOSIS — I130 Hypertensive heart and chronic kidney disease with heart failure and stage 1 through stage 4 chronic kidney disease, or unspecified chronic kidney disease: Secondary | ICD-10-CM | POA: Insufficient documentation

## 2016-07-19 MED ORDER — BENZONATATE 100 MG PO CAPS: 100.0000 mg | ORAL_CAPSULE | Freq: Two times a day (BID) | ORAL | 0 refills | Status: DC | PRN

## 2016-07-19 MED ORDER — ALBUTEROL SULFATE HFA 108 (90 BASE) MCG/ACT IN AERS: 1.0000 | INHALATION_SPRAY | Freq: Four times a day (QID) | RESPIRATORY_TRACT | 3 refills | Status: DC | PRN

## 2016-07-19 MED ORDER — PROMETHAZINE HCL 25 MG PO TABS: 25.0000 mg | ORAL_TABLET | Freq: Three times a day (TID) | ORAL | 0 refills | Status: DC | PRN

## 2016-07-19 MED ORDER — FLUTICASONE PROPIONATE 50 MCG/ACT NA SUSP: 2.0000 | Freq: Every day | NASAL | 1 refills | Status: DC

## 2016-07-19 NOTE — Progress Notes (Signed)
Need med refills- albuteral

## 2016-07-19 NOTE — Telephone Encounter (Signed)
I would like him to come in for an office visit so this can be addressed.

## 2016-07-19 NOTE — Patient Instructions (Signed)

## 2016-07-19 NOTE — Progress Notes (Signed)
Subjective:  Patient ID: [name], male    DOB: 05-Apr-1956  Age: 61 y.o. MRN: [id number]  CC: Nausea; Emesis; Diarrhea; Epistaxis; Fatigue; medication discrepancy; and Hypotension   HPI [name] is a 61 year old male with a history of CHF (EF 20-25% from 2-D echo of 05/2016), chronic kidney disease stage III, Gout, dermatitis who presents today for a follow-up visit.  He had a hospitalization for CHF exacerbation, with severely elevated BNP of 4500 on admission, 2 weeks ago after he had been off his medications. Orthostatic hypotension was a problem at the time, Delene Loll was discontinued and he was referred to urology for evaluation for other treatment modalities for BPH which could possibly eliminate the use of Flomax and improve his blood pressure. 2d echo revealed mildly decreased EF of 20-25% (down from 25-30% one year ago) diffuse hypokinesis, severely reduced systolic function, moderate mitral regurg. He was subsequently discharged on his medications after diuresis.  He complains of nausea, vomiting, diarrhea, fatigue for the last 3 days he has also had an episode of blood-tinged sputum production. Denies fever.  Received call from patient's case for her stating he was having an allergic reaction to medication however the patient denies dizziness. He does have a couple of acute medications which we have reconciled today.  Past Medical History:  Diagnosis Date  . Anxiety   . Arthritis    "all over" (01/07/2015)  . CKD (chronic kidney disease), stage IV ([place])   . Depression   . Headache    "probably weekly" (01/07/2015)  . Heart attack 07/2014  . History of echocardiogram 12/2014   EF 20-25%  . Homelessness   . Hypercholesterolemia   . Hypertension   . Ischemic dilated cardiomyopathy ([place])   . Noncompliance   . NSTEMI (non-ST elevated myocardial infarction) ([place]) 01/07/2015  . Polysubstance abuse    etoh, cocaine  . Urinary hesitancy   . Walking pneumonia 07/2014     Past Surgical History:  Procedure Laterality Date  . CARDIAC CATHETERIZATION    . LEFT HEART CATHETERIZATION WITH CORONARY ANGIOGRAM N/A 08/15/2014   Procedure: LEFT HEART CATHETERIZATION WITH CORONARY ANGIOGRAM;  Surgeon: Clent Demark, MD;  Location: [facility] CATH LAB;  Service: Cardiovascular;  Laterality: N/A;    No Known Allergies   Outpatient Medications Prior to Visit  Medication Sig Dispense Refill  . allopurinol (ZYLOPRIM) 300 MG tablet Take 300 mg by mouth daily.    Marland Kitchen aspirin EC 81 MG EC tablet Take 1 tablet (81 mg total) by mouth daily. 30 tablet 3  . atorvastatin (LIPITOR) 80 MG tablet Take 80 mg by mouth daily.    . carvedilol (COREG) 3.125 MG tablet Take 1 tablet (3.125 mg total) by mouth 2 (two) times daily with a meal. Discontinue 6.25mg  (Patient taking differently: Take 3.125 mg by mouth 2 (two) times daily with a meal. ) 60 tablet 3  . clopidogrel (PLAVIX) 75 MG tablet Take 75 mg by mouth daily.    . furosemide (LASIX) 80 MG tablet Take 80 mg by mouth 2 (two) times daily.  3  . Multiple Vitamin (MULTIVITAMIN WITH MINERALS) TABS tablet Take 1 tablet by mouth daily.    . nitroGLYCERIN (NITROSTAT) 0.4 MG SL tablet Place 1 tablet (0.4 mg total) under the tongue every 5 (five) minutes x 3 doses as needed for chest pain. 25 tablet 0  . PARoxetine (PAXIL) 20 MG tablet Take 1 tablet (20 mg total) by mouth daily. 30 tablet 3  . permethrin (  ELIMITE) 5 % cream Apply 1 application topically See admin instructions. Apply on the skin from the neck down at bedtime  2  . potassium chloride SA (K-DUR,KLOR-CON) 20 MEQ tablet Take 20 mEq by mouth daily.  3  . ranitidine (ZANTAC) 150 MG tablet Take 1 tablet (150 mg total) by mouth 2 (two) times daily. 60 tablet 2  . tamsulosin (FLOMAX) 0.4 MG CAPS capsule Take 0.4 mg by mouth daily.  6  . tiZANidine (ZANAFLEX) 4 MG tablet TAKE 1 TABLET BY MOUTH EVERY 8 HOURS AS NEEDED FOR MUSCLE SPASMS. 60 tablet 1  . triamcinolone cream (KENALOG) 0.1 %  Apply 1 application topically 2 (two) times daily as needed (itching).   1  . albuterol (PROVENTIL HFA;VENTOLIN HFA) 108 (90 Base) MCG/ACT inhaler Inhale 1-2 puffs into the lungs every 6 (six) hours as needed for wheezing or shortness of breath. (Patient not taking: Reported on 07/19/2016) 1 Inhaler 3   No facility-administered medications prior to visit.     ROS Review of Systems  Constitutional: Positive for fatigue. Negative for activity change and appetite change.  HENT: Negative for sinus pressure and sore throat.   Eyes: Negative for visual disturbance.  Respiratory: Negative for cough, chest tightness and shortness of breath.   Cardiovascular: Negative for chest pain and leg swelling.  Gastrointestinal:       See hpi  Endocrine: Negative.   Genitourinary: Negative for dysuria.  Musculoskeletal: Negative for joint swelling and myalgias.  Skin: Negative for rash.  Allergic/Immunologic: Negative.   Neurological: Negative for weakness, light-headedness and numbness.  Psychiatric/Behavioral: Negative for dysphoric mood and suicidal ideas.    Objective:  BP 90/66 (BP Location: Right Arm, Patient Position: Sitting, Cuff Size: Large)   Pulse 93   Temp 97.3 F (36.3 C) (Oral)   Ht 5\' 8"  (1.727 m)   Wt 167 lb (75.8 kg)   SpO2 99%   BMI 25.39 kg/m   BP/Weight 07/19/2016 07/06/2016 65/46/5035  Systolic BP 90 465 681  Diastolic BP 66 50 93  Wt. (Lbs) 167 167.2 183.5  BMI 25.39 25.42 27.9      Physical Exam  Constitutional: He is oriented to person, place, and time. He appears well-developed and well-nourished.  Neck: No JVD present.  Cardiovascular: Normal rate, normal heart sounds and intact distal pulses.   No murmur heard. Pulmonary/Chest: Effort normal and breath sounds normal. He has no wheezes. He has no rales. He exhibits no tenderness.  Abdominal: Soft. Bowel sounds are normal. He exhibits no distension and no mass. There is no tenderness.  Musculoskeletal: Normal  range of motion.  Neurological: He is alert and oriented to person, place, and time.  Psychiatric: He has a normal mood and affect.    CMP Latest Ref Rng & Units 07/06/2016 07/05/2016 07/04/2016  Glucose 65 - 99 mg/dL 102(H) - 87  BUN 6 - 20 mg/dL 40(H) - 31(H)  Creatinine 0.61 - 1.24 mg/dL 2.40(H) - 2.15(H)  Sodium 135 - 145 mmol/L 137 - 141  Potassium 3.5 - 5.1 mmol/L 4.6 - 4.5  Chloride 101 - 111 mmol/L 104 - 105  CO2 22 - 32 mmol/L 24 - 21(L)  Calcium 8.9 - 10.3 mg/dL 9.4 - 10.3  Total Protein 6.5 - 8.1 g/dL 5.5(L) - 7.1  Total Bilirubin 0.3 - 1.2 mg/dL 1.9(H) 1.9(H) 2.7(H)  Alkaline Phos 38 - 126 U/L 94 - 93  AST 15 - 41 U/L 21 - 32  ALT 17 - 63 U/L 15(L) - 18  BNP (last 3 results)  Recent Labs  12/08/15 1445 06/11/16 1604 07/04/16 1917  BNP 1,498.8* 4,425.2* >4,500.0*    ProBNP (last 3 results) No results for input(s): PROBNP in the last 8760 hours.   Assessment & Plan:   1. Gastroenteritis Likely viral Supportive measures, stay hydrated - promethazine (PHENERGAN) 25 MG tablet; Take 1 tablet (25 mg total) by mouth every 8 (eight) hours as needed for nausea or vomiting.  Dispense: 20 tablet; Refill: 0  2. Other specified hypotension Blood pressure tends to run low Scheduled to see urology to discuss possible Flomax discontinuation to improve blood pressure  3. Chronic combined systolic and diastolic congestive heart failure (HCC) EF 20-25% from 2-D echo of 05/2016 Euvolemic Currently on Lasix, beta blocker Not on ACE inhibitor due to soft blood pressure Advised to schedule follow-up appointment with cardiology Continue daily weight checks, low-sodium diet.  4. CKD (chronic kidney disease) stage 3, GFR 30-59 ml/min Avoid nephrotoxic agents Tough line between maintaining his Euvolemia with respect to CHF and maintaining renal function  5. Nasal congestion - fluticasone (FLONASE) 50 MCG/ACT nasal spray; Place 2 sprays into both nostrils daily.  Dispense:  16 g; Refill: 1   Meds ordered this encounter  Medications  . albuterol (PROVENTIL HFA;VENTOLIN HFA) 108 (90 Base) MCG/ACT inhaler    Sig: Inhale 1-2 puffs into the lungs every 6 (six) hours as needed for wheezing or shortness of breath.    Dispense:  1 Inhaler    Refill:  3  . promethazine (PHENERGAN) 25 MG tablet    Sig: Take 1 tablet (25 mg total) by mouth every 8 (eight) hours as needed for nausea or vomiting.    Dispense:  20 tablet    Refill:  0  . fluticasone (FLONASE) 50 MCG/ACT nasal spray    Sig: Place 2 sprays into both nostrils daily.    Dispense:  16 g    Refill:  1    Follow-up: Return in about 1 month (around 08/19/2016) for Follow-up on hypotension.   [name] MD

## 2016-07-19 NOTE — Telephone Encounter (Signed)
Patient coming in per Dr. Jarold Song to discuss medication discrepancy.

## 2016-07-29 ENCOUNTER — Telehealth: Payer: Self-pay | Admitting: Family Medicine

## 2016-07-29 NOTE — Telephone Encounter (Signed)
Von called from [facility] regarding patient. Patient has lost his cane (approx 1 week ago) and would like an order for a new one. Please call Von at [phone] to advise.

## 2016-07-31 ENCOUNTER — Encounter (HOSPITAL_COMMUNITY): Payer: Self-pay

## 2016-07-31 ENCOUNTER — Encounter (HOSPITAL_COMMUNITY)
Admission: EM | Disposition: A | Discharge status: Home / Self Care | Payer: Self-pay | Source: Home / Self Care | Attending: Cardiovascular Disease

## 2016-07-31 ENCOUNTER — Emergency Department (HOSPITAL_COMMUNITY): Payer: Medicaid Other

## 2016-07-31 ENCOUNTER — Inpatient Hospital Stay (HOSPITAL_COMMUNITY)
Admission: EM | Admit: 2016-07-31 | Discharge: 2016-08-04 | DRG: 916 | Disposition: A | Discharge status: Home / Self Care | Payer: Medicaid Other | Attending: Cardiovascular Disease | Admitting: Cardiovascular Disease

## 2016-07-31 DIAGNOSIS — Z87891 Personal history of nicotine dependence: Secondary | ICD-10-CM | POA: Diagnosis not present

## 2016-07-31 DIAGNOSIS — R001 Bradycardia, unspecified: Secondary | ICD-10-CM | POA: Diagnosis present

## 2016-07-31 DIAGNOSIS — Z23 Encounter for immunization: Secondary | ICD-10-CM

## 2016-07-31 DIAGNOSIS — T50905A Adverse effect of unspecified drugs, medicaments and biological substances, initial encounter: Secondary | ICD-10-CM | POA: Diagnosis present

## 2016-07-31 DIAGNOSIS — I252 Old myocardial infarction: Secondary | ICD-10-CM | POA: Diagnosis not present

## 2016-07-31 DIAGNOSIS — R680 Hypothermia, not associated with low environmental temperature: Secondary | ICD-10-CM | POA: Diagnosis present

## 2016-07-31 DIAGNOSIS — N182 Chronic kidney disease, stage 2 (mild): Secondary | ICD-10-CM | POA: Diagnosis present

## 2016-07-31 DIAGNOSIS — I129 Hypertensive chronic kidney disease with stage 1 through stage 4 chronic kidney disease, or unspecified chronic kidney disease: Secondary | ICD-10-CM | POA: Diagnosis present

## 2016-07-31 DIAGNOSIS — E7800 Pure hypercholesterolemia, unspecified: Secondary | ICD-10-CM | POA: Diagnosis present

## 2016-07-31 DIAGNOSIS — E860 Dehydration: Secondary | ICD-10-CM | POA: Diagnosis present

## 2016-07-31 DIAGNOSIS — T886XXA Anaphylactic reaction due to adverse effect of correct drug or medicament properly administered, initial encounter: Principal | ICD-10-CM | POA: Diagnosis present

## 2016-07-31 DIAGNOSIS — Z79899 Other long term (current) drug therapy: Secondary | ICD-10-CM | POA: Diagnosis not present

## 2016-07-31 DIAGNOSIS — Z7951 Long term (current) use of inhaled steroids: Secondary | ICD-10-CM

## 2016-07-31 DIAGNOSIS — Z7982 Long term (current) use of aspirin: Secondary | ICD-10-CM | POA: Diagnosis not present

## 2016-07-31 DIAGNOSIS — Z7902 Long term (current) use of antithrombotics/antiplatelets: Secondary | ICD-10-CM | POA: Diagnosis not present

## 2016-07-31 DIAGNOSIS — F1010 Alcohol abuse, uncomplicated: Secondary | ICD-10-CM | POA: Diagnosis present

## 2016-07-31 DIAGNOSIS — R55 Syncope and collapse: Secondary | ICD-10-CM | POA: Diagnosis present

## 2016-07-31 DIAGNOSIS — I428 Other cardiomyopathies: Secondary | ICD-10-CM | POA: Diagnosis present

## 2016-07-31 LAB — LIPID PANEL
CHOL/HDL RATIO: 2.2 ratio
CHOLESTEROL: 111 mg/dL (ref 0–200)
HDL: 51 mg/dL (ref >40)
LDL CALC: 53 mg/dL (ref 0–99)
Triglycerides: 37 mg/dL (ref <150)
VLDL: 7 mg/dL (ref 0–40)

## 2016-07-31 LAB — CBC WITH DIFFERENTIAL/PLATELET
BASOS PCT: 1 %
Basophils Absolute: 0 10*3/uL (ref 0.0–0.1)
EOS ABS: 0.1 10*3/uL (ref 0.0–0.7)
EOS PCT: 2 %
HCT: 42.3 % (ref 39.0–52.0)
HEMOGLOBIN: 14.1 g/dL (ref 13.0–17.0)
LYMPHS ABS: 1.4 10*3/uL (ref 0.7–4.0)
Lymphocytes Relative: 41 %
MCH: 33.7 pg (ref 26.0–34.0)
MCHC: 33.3 g/dL (ref 30.0–36.0)
MCV: 101 fL — ABNORMAL HIGH (ref 78.0–100.0)
Monocytes Absolute: 0.3 10*3/uL (ref 0.1–1.0)
Monocytes Relative: 8 %
Neutro Abs: 1.6 10*3/uL — ABNORMAL LOW (ref 1.7–7.7)
Neutrophils Relative %: 48 %
PLATELETS: 104 10*3/uL — AB (ref 150–400)
RBC: 4.19 MIL/uL — ABNORMAL LOW (ref 4.22–5.81)
RDW: 16.1 % — ABNORMAL HIGH (ref 11.5–15.5)
WBC: 3.3 10*3/uL — ABNORMAL LOW (ref 4.0–10.5)

## 2016-07-31 LAB — COMPREHENSIVE METABOLIC PANEL
ALBUMIN: 3.8 g/dL (ref 3.5–5.0)
ALT: 18 U/L (ref 17–63)
AST: 30 U/L (ref 15–41)
Alkaline Phosphatase: 108 U/L (ref 38–126)
Anion gap: 9 (ref 5–15)
BUN: 29 mg/dL — ABNORMAL HIGH (ref 6–20)
CHLORIDE: 109 mmol/L (ref 101–111)
CO2: 23 mmol/L (ref 22–32)
Calcium: 9.4 mg/dL (ref 8.9–10.3)
Creatinine, Ser: 1.88 mg/dL — ABNORMAL HIGH (ref 0.61–1.24)
GFR calc Af Amer: 43 mL/min — ABNORMAL LOW (ref >60)
GFR calc non Af Amer: 37 mL/min — ABNORMAL LOW (ref >60)
GLUCOSE: 92 mg/dL (ref 65–99)
Potassium: 4.8 mmol/L (ref 3.5–5.1)
SODIUM: 141 mmol/L (ref 135–145)
Total Bilirubin: 1.7 mg/dL — ABNORMAL HIGH (ref 0.3–1.2)
Total Protein: 6.7 g/dL (ref 6.5–8.1)

## 2016-07-31 LAB — PROTIME-INR
INR: 1.32
PROTHROMBIN TIME: 16.4 s — AB (ref 11.4–15.2)

## 2016-07-31 LAB — I-STAT TROPONIN, ED: Troponin i, poc: 0.02 ng/mL (ref 0.00–0.08)

## 2016-07-31 LAB — APTT: aPTT: 23 seconds — ABNORMAL LOW (ref 24–36)

## 2016-07-31 LAB — MAGNESIUM: Magnesium: 2.4 mg/dL (ref 1.7–2.4)

## 2016-07-31 LAB — MRSA PCR SCREENING: MRSA by PCR: NEGATIVE

## 2016-07-31 LAB — TSH: TSH: 1.946 u[IU]/mL (ref 0.350–4.500)

## 2016-07-31 LAB — BRAIN NATRIURETIC PEPTIDE: B Natriuretic Peptide: 4050.7 pg/mL — ABNORMAL HIGH (ref 0.0–100.0)

## 2016-07-31 LAB — TROPONIN I: Troponin I: <0.03 ng/mL (ref <0.03)

## 2016-07-31 LAB — PHOSPHORUS: Phosphorus: 3.2 mg/dL (ref 2.5–4.6)

## 2016-07-31 SURGERY — LEFT HEART CATH AND CORONARY ANGIOGRAPHY
Anesthesia: LOCAL

## 2016-07-31 MED ORDER — ATORVASTATIN CALCIUM 80 MG PO TABS
80.0000 mg | ORAL_TABLET | Freq: Every day | ORAL | Status: DC
  Administered 2016-08-01 – 2016-08-03 (×3): 80 mg via ORAL
  Filled 2016-07-31 (×3): qty 1

## 2016-07-31 MED ORDER — CARVEDILOL 3.125 MG PO TABS: 3.1250 mg | ORAL_TABLET | Freq: Two times a day (BID) | ORAL | Status: DC

## 2016-07-31 MED ORDER — ONDANSETRON HCL 4 MG PO TABS: 4.0000 mg | ORAL_TABLET | Freq: Four times a day (QID) | ORAL | Status: DC | PRN

## 2016-07-31 MED ORDER — ONDANSETRON HCL 4 MG/2ML IJ SOLN
4.0000 mg | Freq: Four times a day (QID) | INTRAMUSCULAR | Status: DC | PRN
  Administered 2016-07-31: 4 mg via INTRAVENOUS
  Filled 2016-07-31: qty 2

## 2016-07-31 MED ORDER — DOPAMINE-DEXTROSE 3.2-5 MG/ML-% IV SOLN
0.0000 ug/kg/min | Freq: Once | INTRAVENOUS | Status: AC
  Administered 2016-07-31: 5 ug/kg/min via INTRAVENOUS
  Filled 2016-07-31: qty 250

## 2016-07-31 MED ORDER — SODIUM CHLORIDE 0.9 % IV SOLN
INTRAVENOUS | Status: AC
  Administered 2016-07-31 – 2016-08-01 (×3): via INTRAVENOUS

## 2016-07-31 MED ORDER — ASPIRIN EC 81 MG PO TBEC
81.0000 mg | DELAYED_RELEASE_TABLET | Freq: Every day | ORAL | Status: DC
  Administered 2016-08-01 – 2016-08-04 (×4): 81 mg via ORAL
  Filled 2016-07-31 (×4): qty 1

## 2016-07-31 MED ORDER — HEPARIN SODIUM (PORCINE) 5000 UNIT/ML IJ SOLN: 60.0000 [IU]/kg | INTRAMUSCULAR | Status: DC

## 2016-07-31 MED ORDER — SODIUM CHLORIDE 0.9 % IV SOLN
10.0000 mL/h | INTRAVENOUS | Status: DC
  Administered 2016-07-31: 20 mL/h via INTRAVENOUS

## 2016-07-31 MED ORDER — ACETAMINOPHEN 325 MG PO TABS
650.0000 mg | ORAL_TABLET | Freq: Four times a day (QID) | ORAL | Status: DC | PRN
  Administered 2016-08-01: 650 mg via ORAL
  Filled 2016-07-31: qty 2

## 2016-07-31 MED ORDER — ALLOPURINOL 300 MG PO TABS
150.0000 mg | ORAL_TABLET | Freq: Every day | ORAL | Status: DC
  Administered 2016-08-01 – 2016-08-04 (×4): 150 mg via ORAL
  Filled 2016-07-31 (×5): qty 1

## 2016-07-31 MED ORDER — ALBUTEROL SULFATE HFA 108 (90 BASE) MCG/ACT IN AERS: 1.0000 | INHALATION_SPRAY | Freq: Four times a day (QID) | RESPIRATORY_TRACT | Status: DC | PRN

## 2016-07-31 MED ORDER — ATROPINE SULFATE 1 MG/ML IJ SOLN
0.5000 mg | Freq: Once | INTRAMUSCULAR | Status: DC
  Filled 2016-07-31: qty 1

## 2016-07-31 MED ORDER — HEPARIN SODIUM (PORCINE) 5000 UNIT/ML IJ SOLN
5000.0000 [IU] | Freq: Three times a day (TID) | INTRAMUSCULAR | Status: DC
  Administered 2016-07-31 – 2016-08-04 (×11): 5000 [IU] via SUBCUTANEOUS
  Filled 2016-07-31 (×12): qty 1

## 2016-07-31 MED ORDER — CLOPIDOGREL BISULFATE 75 MG PO TABS
75.0000 mg | ORAL_TABLET | Freq: Every day | ORAL | Status: DC
  Administered 2016-08-01 – 2016-08-04 (×4): 75 mg via ORAL
  Filled 2016-07-31 (×4): qty 1

## 2016-07-31 MED ORDER — DOPAMINE-DEXTROSE 3.2-5 MG/ML-% IV SOLN
0.0000 ug/kg/min | INTRAVENOUS | Status: DC
  Administered 2016-07-31: 5 ug/kg/min via INTRAVENOUS
  Administered 2016-08-01: 10 ug/kg/min via INTRAVENOUS
  Filled 2016-07-31: qty 250

## 2016-07-31 MED ORDER — SODIUM CHLORIDE 0.9% FLUSH
3.0000 mL | Freq: Two times a day (BID) | INTRAVENOUS | Status: DC
  Administered 2016-07-31 – 2016-08-03 (×7): 3 mL via INTRAVENOUS

## 2016-07-31 MED ORDER — ASPIRIN 81 MG PO CHEW: 162.0000 mg | CHEWABLE_TABLET | Freq: Once | ORAL | Status: DC

## 2016-07-31 MED ORDER — ACETAMINOPHEN 650 MG RE SUPP: 650.0000 mg | Freq: Four times a day (QID) | RECTAL | Status: DC | PRN

## 2016-07-31 MED ORDER — ADULT MULTIVITAMIN W/MINERALS CH
1.0000 | ORAL_TABLET | Freq: Every day | ORAL | Status: DC
  Administered 2016-08-01 – 2016-08-04 (×4): 1 via ORAL
  Filled 2016-07-31 (×4): qty 1

## 2016-07-31 MED ORDER — THIAMINE HCL 100 MG/ML IJ SOLN
100.0000 mg | Freq: Every day | INTRAMUSCULAR | Status: DC
  Administered 2016-08-01 – 2016-08-02 (×2): 100 mg via INTRAVENOUS
  Filled 2016-07-31 (×2): qty 2

## 2016-07-31 NOTE — Progress Notes (Signed)
Increased Dopamine drip

## 2016-07-31 NOTE — ED Provider Notes (Signed)
[name] Provider Note  CSN: [id number] Arrival date & time: 07/31/16  1433   History   Chief Complaint Chief Complaint  Patient presents with  . Dizziness    pt went to church this am and had an episode of dizziness was found to be hypotensive    HPI [name] is a 61 y.o. male.  The history is provided by the patient. The history is limited by the condition of the patient. No language interpreter was used.  Illness  This is a new problem. The current episode started 3 to 5 hours ago. The problem occurs constantly. The problem has been rapidly worsening. The symptoms are aggravated by walking.    Past Medical History:  Diagnosis Date  . Anxiety   . Arthritis    "all over" (01/07/2015)  . CKD (chronic kidney disease), stage IV ([place])   . Depression   . Headache    "probably weekly" (01/07/2015)  . Heart attack 07/2014  . History of echocardiogram 12/2014   EF 20-25%  . Homelessness   . Hypercholesterolemia   . Hypertension   . Ischemic dilated cardiomyopathy ([place])   . Noncompliance   . NSTEMI (non-ST elevated myocardial infarction) ([place]) 01/07/2015  . Polysubstance abuse    etoh, cocaine  . Urinary hesitancy   . Walking pneumonia 07/2014   Patient Active Problem List   Diagnosis Date Noted  . Syncope 07/31/2016  . Acute on chronic systolic heart failure ([place]) 07/05/2016  . CHF exacerbation ([place]) 07/05/2016  . Macrocytosis without anemia 07/05/2016  . Hypotension 06/14/2016  . Generalized anxiety disorder 06/13/2016  . Heart failure ([place]) 06/11/2016  . Dehydration 04/29/2016  . Dermatitis 01/27/2016  . Arthralgia 08/25/2015  . Hyperbilirubinemia   . Renal failure (ARF), acute on chronic (HCC)   . Dyspnea   . CKD (chronic kidney disease) stage 3, GFR 30-59 ml/min 05/03/2015  . Chronic combined systolic and diastolic congestive heart failure ([place]) 05/03/2015  . Benign essential HTN 05/03/2015  . History of non-ST elevation myocardial infarction  (NSTEMI) 05/03/2015  . Abnormal LFTs 05/03/2015  . Polysubstance abuse   . Noncompliance   . Gout 03/24/2015  . Hypotensive episode 09/03/2014   Past Surgical History:  Procedure Laterality Date  . CARDIAC CATHETERIZATION    . LEFT HEART CATHETERIZATION WITH CORONARY ANGIOGRAM N/A 08/15/2014   Procedure: LEFT HEART CATHETERIZATION WITH CORONARY ANGIOGRAM;  Surgeon: Clent Demark, MD;  Location: [facility] CATH LAB;  Service: Cardiovascular;  Laterality: N/A;    Home Medications    Prior to Admission medications   Medication Sig Start Date End Date Taking? Authorizing Provider  albuterol (PROVENTIL HFA;VENTOLIN HFA) 108 (90 Base) MCG/ACT inhaler Inhale 1-2 puffs into the lungs every 6 (six) hours as needed for wheezing or shortness of breath. 07/19/16   [name], MD  allopurinol (ZYLOPRIM) 300 MG tablet Take 300 mg by mouth daily.    Historical Provider, MD  aspirin EC 81 MG EC tablet Take 1 tablet (81 mg total) by mouth daily. 06/13/16   Minus Liberty, MD  atorvastatin (LIPITOR) 80 MG tablet Take 80 mg by mouth daily.    Historical Provider, MD  benzonatate (TESSALON) 100 MG capsule Take 1 capsule (100 mg total) by mouth 2 (two) times daily as needed for cough. 07/19/16   [name], MD  carvedilol (COREG) 3.125 MG tablet Take 1 tablet (3.125 mg total) by mouth 2 (two) times daily with a meal. Discontinue 6.25mg  Patient taking differently: Take 3.125 mg by mouth  2 (two) times daily with a meal.  01/27/16   [name], MD  clopidogrel (PLAVIX) 75 MG tablet Take 75 mg by mouth daily.    Historical Provider, MD  fluticasone (FLONASE) 50 MCG/ACT nasal spray Place 2 sprays into both nostrils daily. 07/19/16   [name], MD  furosemide (LASIX) 80 MG tablet Take 80 mg by mouth 2 (two) times daily. 06/27/16   Historical Provider, MD  Multiple Vitamin (MULTIVITAMIN WITH MINERALS) TABS tablet Take 1 tablet by mouth daily.    Historical Provider, MD  nitroGLYCERIN (NITROSTAT) 0.4 MG SL tablet  Place 1 tablet (0.4 mg total) under the tongue every 5 (five) minutes x 3 doses as needed for chest pain. 11/02/15   [name], MD  PARoxetine (PAXIL) 20 MG tablet Take 1 tablet (20 mg total) by mouth daily. 06/12/16   Minus Liberty, MD  permethrin (ELIMITE) 5 % cream Apply 1 application topically See admin instructions. Apply on the skin from the neck down at bedtime 06/28/16   Historical Provider, MD  potassium chloride SA (K-DUR,KLOR-CON) 20 MEQ tablet Take 20 mEq by mouth daily. 06/04/16   Historical Provider, MD  promethazine (PHENERGAN) 25 MG tablet Take 1 tablet (25 mg total) by mouth every 8 (eight) hours as needed for nausea or vomiting. 07/19/16   [name], MD  ranitidine (ZANTAC) 150 MG tablet Take 1 tablet (150 mg total) by mouth 2 (two) times daily. 06/06/16   [name], MD  tamsulosin (FLOMAX) 0.4 MG CAPS capsule Take 0.4 mg by mouth daily. 06/13/16   Historical Provider, MD  tiZANidine (ZANAFLEX) 4 MG tablet TAKE 1 TABLET BY MOUTH EVERY 8 HOURS AS NEEDED FOR MUSCLE SPASMS. 04/18/16   [name], MD  triamcinolone cream (KENALOG) 0.1 % Apply 1 application topically 2 (two) times daily as needed (itching).  06/28/16   Historical Provider, MD   Family History History reviewed. No pertinent family history.  Social History Social History  Substance Use Topics  . Smoking status: Former Smoker    Packs/day: 1.50    Years: 30.00    Types: Cigarettes    Quit date: 02/19/2004  . Smokeless tobacco: Never Used     Comment: quit 2005  . Alcohol use 1.2 - 2.4 oz/week    2 - 4 Cans of beer per week     Comment: once weekly    Allergies   Tape  Review of Systems Review of Systems  Unable to perform ROS: Acuity of condition  Neurological: Positive for dizziness, syncope and light-headedness.    Physical Exam Updated Vital Signs BP 101/79 (BP Location: Left Arm)   Pulse (!) 104   Temp 97.9 F (36.6 C) (Rectal)   Resp (!) 27   Ht 5\' 8"  (1.727 m)   Wt 74.8 kg    SpO2 97%   BMI 25.09 kg/m   Physical Exam  Constitutional: He appears distressed.  Overweight elderly Afro-American male  HENT:  Head: Normocephalic and atraumatic.  Eyes: Pupils are equal, round, and reactive to light.  Neck: Neck supple.  Cardiovascular: Regular rhythm and normal heart sounds.   Bradycardic, hypotensive  Pulmonary/Chest: Effort normal and breath sounds normal.  Abdominal: Soft. Bowel sounds are normal. He exhibits no distension.  Musculoskeletal: Normal range of motion.  Neurological: He displays normal reflexes. He exhibits normal muscle tone.  Drowsy but arousable to voice, following commands all 4 extremities  Skin: Skin is warm. Capillary refill takes less than 2 seconds. He is diaphoretic.  Nursing  note and vitals reviewed.   ED Treatments / Results  Labs (all labs ordered are listed, but only abnormal results are displayed) Labs Reviewed  CBC WITH DIFFERENTIAL/PLATELET - Abnormal; Notable for the following:       Result Value   WBC 3.3 (*)    RBC 4.19 (*)    MCV 101.0 (*)    RDW 16.1 (*)    Platelets 104 (*)    Neutro Abs 1.6 (*)    All other components within normal limits  COMPREHENSIVE METABOLIC PANEL - Abnormal; Notable for the following:    BUN 29 (*)    Creatinine, Ser 1.88 (*)    Total Bilirubin 1.7 (*)    GFR calc non Af Amer 37 (*)    GFR calc Af Amer 43 (*)    All other components within normal limits  BRAIN NATRIURETIC PEPTIDE - Abnormal; Notable for the following:    B Natriuretic Peptide 4,050.7 (*)    All other components within normal limits  PROTIME-INR - Abnormal; Notable for the following:    Prothrombin Time 16.4 (*)    All other components within normal limits  APTT - Abnormal; Notable for the following:    aPTT 23 (*)    All other components within normal limits  MRSA PCR SCREENING  TROPONIN I  MAGNESIUM  PHOSPHORUS  LIPID PANEL  TROPONIN I  TSH  TROPONIN I  TROPONIN I  CBC  BASIC METABOLIC PANEL  I-STAT  TROPOININ, ED  I-STAT TROPOININ, ED    EKG  EKG Interpretation  Date/Time:  Sunday July 31 2016 15:25:05 EST Ventricular Rate:  64 PR Interval:    QRS Duration: 201 QT Interval:  532 QTC Calculation: 549 R Axis:   -48 Text Interpretation:  Sinus or ectopic atrial rhythm Nonspecific IVCD with LAD Abnormal T, consider ischemia, lateral leads Confirmed by RAY MD, Andee Poles [phone]) on 07/31/2016 5:17:30 PM      Radiology Dg Chest Port 1 View  Result Date: 07/31/2016 CLINICAL DATA:  Central line placement.  Bradycardia. EXAM: PORTABLE CHEST 1 VIEW COMPARISON:  July 04, 2016 FINDINGS: A right IJ sheath terminates in the right brachiocephalic vein, just above the confluence. No pneumothorax. Cardiomegaly is stable. The hila and mediastinum are unremarkable. No nodules, masses, or focal infiltrates. Minimal interstitial prominence without overt edema. IMPRESSION: Cardiomegaly. Mild pulmonary venous congestion not excluded. A right IJ sheath terminates in the right brachiocephalic vein, just above the confluence with no pneumothorax. Electronically Signed   By: Dorise Bullion III M.D   On: 07/31/2016 16:10    Procedures .Central Line Date/Time: 08/01/2016 3:30 PM Performed by: Mayer Camel Authorized by: Pattricia Boss   Consent:    Consent obtained:  Emergent situation   Alternatives discussed:  No treatment Pre-procedure details:    Skin preparation:  2% chlorhexidine and Betadine Anesthesia (see MAR for exact dosages):    Anesthesia method:  Local infiltration   Local anesthetic:  Lidocaine 2% w/o epi Procedure details:    Location:  R internal jugular   Patient position:  Reverse Trendelenburg   Procedural supplies: Introducer.   Catheter size:  7.5 Fr   Landmarks identified: yes     Ultrasound guidance: yes     Sterile ultrasound techniques: Sterile gel and sterile probe covers were used     Number of attempts:  2   Successful placement: yes   Post-procedure  details:    Post-procedure:  Dressing applied and line sutured   Assessment:  Blood  return through all ports, no pneumothorax on x-ray, placement verified by x-ray and free fluid flow   Patient tolerance of procedure:  Tolerated well, no immediate complications    Medications Ordered in ED Medications  aspirin chewable tablet 162 mg (162 mg Oral Not Given 07/31/16 1607)  heparin injection 5,000 Units (5,000 Units Subcutaneous Given 07/31/16 2038)  sodium chloride flush (NS) 0.9 % injection 3 mL (3 mLs Intravenous Given 07/31/16 2045)  0.9 %  sodium chloride infusion ( Intravenous New Bag/Given 07/31/16 2300)  acetaminophen (TYLENOL) tablet 650 mg (not administered)    Or  acetaminophen (TYLENOL) suppository 650 mg (not administered)  ondansetron (ZOFRAN) tablet 4 mg ( Oral See Alternative 07/31/16 2038)    Or  ondansetron (ZOFRAN) injection 4 mg (4 mg Intravenous Given 07/31/16 2038)  aspirin EC tablet 81 mg (not administered)  thiamine (B-1) injection 100 mg (not administered)  allopurinol (ZYLOPRIM) tablet 150 mg (150 mg Oral Not Given 07/31/16 1830)  atorvastatin (LIPITOR) tablet 80 mg (not administered)  carvedilol (COREG) tablet 3.125 mg (3.125 mg Oral Not Given 07/31/16 2001)  clopidogrel (PLAVIX) tablet 75 mg (75 mg Oral Not Given 07/31/16 1830)  multivitamin with minerals tablet 1 tablet (not administered)  DOPamine (INTROPIN) 800 mg in dextrose 5 % 250 mL (3.2 mg/mL) infusion (10 mcg/kg/min  74.8 kg Intravenous Rate/Dose Change 07/31/16 2253)  pneumococcal 23 valent vaccine (PNU-IMMUNE) injection 0.5 mL (not administered)  DOPamine (INTROPIN) 800 mg in dextrose 5 % 250 mL (3.2 mg/mL) infusion (5 mcg/kg/min  74.8 kg Intravenous New Bag/Given 07/31/16 1614)    Initial Impression / Assessment and Plan / ED Course  I have reviewed the triage vital signs and the nursing notes.  Pertinent labs & imaging results that were available during my care of the patient were reviewed by me and  considered in my medical decision making (see chart for details).  Clinical Course   61 y.o. male with above stated PMHx, HPI, and physical. History as above.  EKG with concern for STEMI. Called out and cath lab activated. Cardiology evaluated in the ED. External pacing pads placed and patient given atropine. Introducer catheter placed to right IJ for possible internal pacing.  Laboratory and imaging results were personally reviewed by myself and used in the medical decision making of this patient's treatment and disposition.  Pt admitted to ICU for further evaluation and management of above. Pt understands and agrees with the plan and has no further questions or concerns.   Pt care discussed with and followed by my attending, Dr. Clarene Critchley, MD Pager [phone]   Final Clinical Impressions(s) / ED Diagnoses  Syncope Bradycardia Hypotension  New Prescriptions Current Discharge Medication List       Mayer Camel, MD 08/01/16 8206    Pattricia Boss, MD 08/04/16 1312

## 2016-07-31 NOTE — ED Notes (Signed)
IVF infusing wide open Oxygen increased to [place] due to low SPO2 at 91% pt in trendelenburg position while resident attempts IJ placement pt remains on monitor in NSB pt responding to verbal stimluli

## 2016-07-31 NOTE — ED Notes (Signed)
Cardiology to bedside. 

## 2016-07-31 NOTE — ED Notes (Signed)
Pt has periods of HR that drops down to 30's Atropine pulled pt's HR now in the 50'60's range ER MD at [facility] preparing an IJ access.  External PADS on pt and monitor capturing ER MD remains at [facility] pt responding to verbal stimuli

## 2016-07-31 NOTE — H&P (Signed)
Referring Physician: Charolette Forward  [name] is an 61 y.o. male.                      Chief complaint: Syncope  HPI: 61 year old male with known history of dilated, non-ischemic cardiomyopathy and polysubstance abuse has syncopal episode after return from church this AM. He has h/o alcohol use and occasional cocaine use per patient. He was bradycardic and hypotensive in ER. Post fluid resuscitation blood pressor and heart rate improved. He was given bear hugger for hypothermia. Currently he is awake and denies chest pain or palpitation prior to passing out.\  Past Medical History:  Diagnosis Date  . Anxiety   . Arthritis    "all over" (01/07/2015)  . CKD (chronic kidney disease), stage IV ([place])   . Depression   . Headache    "probably weekly" (01/07/2015)  . Heart attack 07/2014  . History of echocardiogram 12/2014   EF 20-25%  . Homelessness   . Hypercholesterolemia   . Hypertension   . Ischemic dilated cardiomyopathy ([place])   . Noncompliance   . NSTEMI (non-ST elevated myocardial infarction) ([place]) 01/07/2015  . Polysubstance abuse    etoh, cocaine  . Urinary hesitancy   . Walking pneumonia 07/2014      Past Surgical History:  Procedure Laterality Date  . CARDIAC CATHETERIZATION    . LEFT HEART CATHETERIZATION WITH CORONARY ANGIOGRAM N/A 08/15/2014   Procedure: LEFT HEART CATHETERIZATION WITH CORONARY ANGIOGRAM;  Surgeon: Clent Demark, MD;  Location: [facility] CATH LAB;  Service: Cardiovascular;  Laterality: N/A;    No family history on file. Social History:  reports that he quit smoking about 12 years ago. His smoking use included Cigarettes. He has a 45.00 pack-year smoking history. He has never used smokeless tobacco. He reports that he drinks about 1.2 - 2.4 oz of alcohol per week . He reports that he does not use drugs.  Allergies: No Known Allergies   (Not in a hospital admission)  Results for orders placed or performed during the hospital encounter of 07/31/16 (from  the past 48 hour(s))  Troponin I     Status: None   Collection Time: 07/31/16  3:02 PM  Result Value Ref Range   Troponin I <0.03 <0.03 ng/mL  CBC with Differential     Status: Abnormal   Collection Time: 07/31/16  3:02 PM  Result Value Ref Range   WBC 3.3 (L) 4.0 - 10.5 K/uL   RBC 4.19 (L) 4.22 - 5.81 MIL/uL   Hemoglobin 14.1 13.0 - 17.0 g/dL   HCT 42.3 39.0 - 52.0 %   MCV 101.0 (H) 78.0 - 100.0 fL   MCH 33.7 26.0 - 34.0 pg   MCHC 33.3 30.0 - 36.0 g/dL   RDW 16.1 (H) 11.5 - 15.5 %   Platelets 104 (L) 150 - 400 K/uL    Comment: REPEATED TO VERIFY SPECIMEN CHECKED FOR CLOTS PLATELET COUNT CONFIRMED BY SMEAR    Neutrophils Relative % 48 %   Neutro Abs 1.6 (L) 1.7 - 7.7 K/uL   Lymphocytes Relative 41 %   Lymphs Abs 1.4 0.7 - 4.0 K/uL   Monocytes Relative 8 %   Monocytes Absolute 0.3 0.1 - 1.0 K/uL   Eosinophils Relative 2 %   Eosinophils Absolute 0.1 0.0 - 0.7 K/uL   Basophils Relative 1 %   Basophils Absolute 0.0 0.0 - 0.1 K/uL  Comprehensive metabolic panel     Status: Abnormal   Collection  Time: 07/31/16  3:02 PM  Result Value Ref Range   Sodium 141 135 - 145 mmol/L   Potassium 4.8 3.5 - 5.1 mmol/L   Chloride 109 101 - 111 mmol/L   CO2 23 22 - 32 mmol/L   Glucose, Bld 92 65 - 99 mg/dL   BUN 29 (H) 6 - 20 mg/dL   Creatinine, Ser 1.88 (H) 0.61 - 1.24 mg/dL   Calcium 9.4 8.9 - 10.3 mg/dL   Total Protein 6.7 6.5 - 8.1 g/dL   Albumin 3.8 3.5 - 5.0 g/dL   AST 30 15 - 41 U/L   ALT 18 17 - 63 U/L   Alkaline Phosphatase 108 38 - 126 U/L   Total Bilirubin 1.7 (H) 0.3 - 1.2 mg/dL   GFR calc non Af Amer 37 (L) >60 mL/min   GFR calc Af Amer 43 (L) >60 mL/min    Comment: (NOTE) The eGFR has been calculated using the CKD EPI equation. This calculation has not been validated in all clinical situations. eGFR's persistently <60 mL/min signify possible Chronic Kidney Disease.    Anion gap 9 5 - 15  Magnesium     Status: None   Collection Time: 07/31/16  3:02 PM  Result Value  Ref Range   Magnesium 2.4 1.7 - 2.4 mg/dL  Phosphorus     Status: None   Collection Time: 07/31/16  3:02 PM  Result Value Ref Range   Phosphorus 3.2 2.5 - 4.6 mg/dL  Brain natriuretic peptide     Status: Abnormal   Collection Time: 07/31/16  3:02 PM  Result Value Ref Range   B Natriuretic Peptide 4,050.7 (H) 0.0 - 100.0 pg/mL  Lipid panel     Status: None   Collection Time: 07/31/16  3:05 PM  Result Value Ref Range   Cholesterol 111 0 - 200 mg/dL   Triglycerides 37 <150 mg/dL   HDL 51 >40 mg/dL   Total CHOL/HDL Ratio 2.2 RATIO   VLDL 7 0 - 40 mg/dL   LDL Cholesterol 53 0 - 99 mg/dL    Comment:        Total Cholesterol/HDL:CHD Risk Coronary Heart Disease Risk Table                     Men   Women  1/2 Average Risk   3.4   3.3  Average Risk       5.0   4.4  2 X Average Risk   9.6   7.1  3 X Average Risk  23.4   11.0        Use the calculated Patient Ratio above and the CHD Risk Table to determine the patient's CHD Risk.        ATP III CLASSIFICATION (LDL):  <100     mg/dL   Optimal  100-129  mg/dL   Near or Above                    Optimal  130-159  mg/dL   Borderline  160-189  mg/dL   High  >190     mg/dL   Very High   I-stat troponin, ED     Status: None   Collection Time: 07/31/16  3:18 PM  Result Value Ref Range   Troponin i, poc 0.02 0.00 - 0.08 ng/mL   Comment 3            Comment: Due to the release kinetics of cTnI, a negative result within  the first hours of the onset of symptoms does not rule out myocardial infarction with certainty. If myocardial infarction is still suspected, repeat the test at appropriate intervals.    Dg Chest Port 1 View  Result Date: 07/31/2016 CLINICAL DATA:  Central line placement.  Bradycardia. EXAM: PORTABLE CHEST 1 VIEW COMPARISON:  July 04, 2016 FINDINGS: A right IJ sheath terminates in the right brachiocephalic vein, just above the confluence. No pneumothorax. Cardiomegaly is stable. The hila and mediastinum are  unremarkable. No nodules, masses, or focal infiltrates. Minimal interstitial prominence without overt edema. IMPRESSION: Cardiomegaly. Mild pulmonary venous congestion not excluded. A right IJ sheath terminates in the right brachiocephalic vein, just above the confluence with no pneumothorax. Electronically Signed   By: Dorise Bullion III M.D   On: 07/31/2016 16:10    Review Of Systems Constitutional: No fever, chills , weight loss or gain. Eyes: No vision change, No discharge or pain.. Ears: No hearing loss, No tinnitus. Respiratory: No asthma, COPD, pneumonias. Positive shortness of breath. No hemoptysis. Cardiovascular: No chest pain, palpitation or leg edema. Gastrointestinal: Positive nausea, vomiting or diarrhea or constipation. No GI bleed. No hepatitis. Genitourinary: No dysuria, hematuria or kidney stone. No incontinance. Neurological: No headache, stroke or seizures. Posiitive for dizziness. Psychiatry: No psych facility admission for anxiety, depression or suicide. No detox. Skin: No rash. Musculoskeletal: No joint pain or fibromyalgia. No neck pain or back pain. Lymphadenopathy: No lymphadenopathy. Hematology: No anemia or easy bruising.   Blood pressure 99/65, pulse 72, temperature (S) (!) 93.9 F (34.4 C), temperature source Rectal, resp. rate 20, height '5\' 8"'  (1.727 m), weight 74.8 kg (165 lb), SpO2 98 %. Body mass index is 25.09 kg/m. General appearance: alert, cooperative, appears stated age and no distress Head: Normocephalic, atraumatic. Eyes: Brown eyes, conjunctivae/corneas clear.  Neck: No adenopathy, no carotid bruit, no JVD, supple, symmetrical, trachea midline and thyroid not enlarged. Resp: clear to auscultation bilaterally Cardio: regular rate and rhythm, S1, S2 normal, II/VI systolic murmur, no click, rub or gallop GI: soft, non-tender; bowel sounds normal; no masses,  no organomegaly Extremities: extremities normal, atraumatic, no cyanosis or edema Skin:  Cool and dry. No rashes or lesions Neurologic: Alert and oriented X 3, normal strength and tone.   Assessment/Plan Syncope Dehydration Alcohol use disorder CKD II Non-ischemic dilated cardiomyopathy  IV fluids IV dopamine. Admit to step down. Hold anti-hypertensive medications.  Birdie Riddle, MD  07/31/2016, 4:36 PM

## 2016-07-31 NOTE — ED Notes (Signed)
Pt moved to [facility] with ER MD and resident at bedside pt remains on monitor in NSB and STEMI paged out. PT denies CP denies SOB

## 2016-07-31 NOTE — ED Notes (Signed)
Dr. Marijean Bravo back to room cancelled cath lab, cath lab made aware pt remains on Heart Monitor SL IJ Line placed by ER MD CXR at bedside completed.  Pt remains responsive to verbal stimuli

## 2016-07-31 NOTE — ED Notes (Signed)
Pt. Moved to other room no change in condition remains on the monitor with pads in place will continue to monitor

## 2016-07-31 NOTE — ED Notes (Signed)
Cath lab called Dr. Marijean Bravo at bedside ER MD remains at [facility] attempting an IJ placement will bring pt up to cath lab Rm #1 once ER MD complete with procedure pt remains on monitor no change in VS or mentation

## 2016-07-31 NOTE — ED Notes (Signed)
ER MD to room pt is drowsy responds to verbal stimuli pt remains on the monitor in a NSB with drops into the 40's ER Resident at bedside with [place] looking at HR

## 2016-07-31 NOTE — ED Notes (Signed)
ER MD and ER Resident remain at [facility] attempting IJ Central line placement pt remains on monitor and responds to verbal stimuli

## 2016-07-31 NOTE — Progress Notes (Signed)
BP incorrect, rechecked 111/78

## 2016-07-31 NOTE — ED Notes (Signed)
Blanket warmer placed on pt. Pt remains on monitor is in no distress will continue to monitor

## 2016-08-01 ENCOUNTER — Encounter (HOSPITAL_COMMUNITY): Payer: Self-pay | Admitting: Emergency Medicine

## 2016-08-01 LAB — BASIC METABOLIC PANEL
Anion gap: 9 (ref 5–15)
BUN: 26 mg/dL — ABNORMAL HIGH (ref 6–20)
CHLORIDE: 111 mmol/L (ref 101–111)
CO2: 18 mmol/L — ABNORMAL LOW (ref 22–32)
CREATININE: 1.57 mg/dL — AB (ref 0.61–1.24)
Calcium: 9 mg/dL (ref 8.9–10.3)
GFR, EST AFRICAN AMERICAN: 53 mL/min — AB (ref >60)
GFR, EST NON AFRICAN AMERICAN: 46 mL/min — AB (ref >60)
Glucose, Bld: 119 mg/dL — ABNORMAL HIGH (ref 65–99)
Potassium: 4.5 mmol/L (ref 3.5–5.1)
SODIUM: 138 mmol/L (ref 135–145)

## 2016-08-01 LAB — CBC
HCT: 41.7 % (ref 39.0–52.0)
Hemoglobin: 13.8 g/dL (ref 13.0–17.0)
MCH: 33.3 pg (ref 26.0–34.0)
MCHC: 33.1 g/dL (ref 30.0–36.0)
MCV: 100.7 fL — ABNORMAL HIGH (ref 78.0–100.0)
PLATELETS: 113 10*3/uL — AB (ref 150–400)
RBC: 4.14 MIL/uL — AB (ref 4.22–5.81)
RDW: 15.3 % (ref 11.5–15.5)
WBC: 4.9 10*3/uL (ref 4.0–10.5)

## 2016-08-01 LAB — TROPONIN I

## 2016-08-01 MED ORDER — PNEUMOCOCCAL VAC POLYVALENT 25 MCG/0.5ML IJ INJ
0.5000 mL | INJECTION | INTRAMUSCULAR | Status: AC
  Administered 2016-08-02: 0.5 mL via INTRAMUSCULAR
  Filled 2016-08-01: qty 0.5

## 2016-08-01 NOTE — Progress Notes (Signed)
Consult received re: Advanced Directives.  Apparently patient was asked about this and did not understand what it meant.  I discussed purpose of HCPOA and Living Will and provided him with the documents.  He will read over and if he decides to complete one he will notify his nurse.   Thank You,  Arliene Rosenow, [place]

## 2016-08-01 NOTE — Progress Notes (Signed)
Subjective:  Awake denies any chest pain or shortness of breath. Denies any palpitations. Remains on dopamine.  Objective:  Vital Signs in the last 24 hours: Temp:  [93.9 F (34.4 C)-98.3 F (36.8 C)] 98 F (36.7 C) (01/15 0801) Pulse Rate:  [32-128] 109 (01/15 1130) Resp:  [7-31] 17 (01/15 1130) BP: (55-125)/(42-99) 104/86 (01/15 1130) SpO2:  [91 %-100 %] 94 % (01/15 1130) Weight:  [165 lb (74.8 kg)-165 lb 8 oz (75.1 kg)] 165 lb 8 oz (75.1 kg) (01/15 0415)  Intake/Output from previous day: 01/14 0701 - 01/15 0700 In: 1647.2 [P.O.:600; I.V.:1047.2] Out: 2465 [Urine:2065; Stool:400] Intake/Output from this shift: Total I/O In: 849 [P.O.:360; I.V.:489] Out: 200 [Urine:200]  Physical Exam: Neck: no adenopathy, no carotid bruit, no JVD and supple, symmetrical, trachea midline Lungs: clear to auscultation bilaterally Heart: regular rate and rhythm, S1, S2 normal and 2/6 systolic murmur noted Abdomen: soft, non-tender; bowel sounds normal; no masses,  no organomegaly Extremities: extremities normal, atraumatic, no cyanosis or edema  Lab Results:  Recent Labs  07/31/16 1502 08/01/16 0637  WBC 3.3* 4.9  HGB 14.1 13.8  PLT 104* 113*    Recent Labs  07/31/16 1502 08/01/16 0637  NA 141 138  K 4.8 4.5  CL 109 111  CO2 23 18*  GLUCOSE 92 119*  BUN 29* 26*  CREATININE 1.88* 1.57*    Recent Labs  07/31/16 2340 08/01/16 0637  TROPONINI <0.03 <0.03   Hepatic Function Panel  Recent Labs  07/31/16 1502  PROT 6.7  ALBUMIN 3.8  AST 30  ALT 18  ALKPHOS 108  BILITOT 1.7*    Recent Labs  07/31/16 1505  CHOL 111   No results for input(s): PROTIME in the last 72 hours.  Imaging: Imaging results have been reviewed and Dg Chest Port 1 View  Result Date: 07/31/2016 CLINICAL DATA:  Central line placement.  Bradycardia. EXAM: PORTABLE CHEST 1 VIEW COMPARISON:  July 04, 2016 FINDINGS: A right IJ sheath terminates in the right brachiocephalic vein, just above  the confluence. No pneumothorax. Cardiomegaly is stable. The hila and mediastinum are unremarkable. No nodules, masses, or focal infiltrates. Minimal interstitial prominence without overt edema. IMPRESSION: Cardiomegaly. Mild pulmonary venous congestion not excluded. A right IJ sheath terminates in the right brachiocephalic vein, just above the confluence with no pneumothorax. Electronically Signed   By: Dorise Bullion III M.D   On: 07/31/2016 16:10    Cardiac Studies:  Assessment/Plan:  Status post syncope probably secondary to orthostatic hypotension Hypotensive shock probably secondary to medications Dehydration Nonischemic dilated cardiomyopathy Hypertension History of EtOH and cocaine abuse in the past History of non-Q-wave MI in the past the setting of cocaine abuse in the past Chronic kidney disease stage II Plan Hold all BP medicines Wean off dopamine as tolerated  LOS: 1 day    Charolette Forward 08/01/2016, 11:32 AM

## 2016-08-02 MED ORDER — FOLIC ACID 1 MG PO TABS
1.0000 mg | ORAL_TABLET | Freq: Every day | ORAL | Status: DC
  Administered 2016-08-02 – 2016-08-04 (×3): 1 mg via ORAL
  Filled 2016-08-02 (×3): qty 1

## 2016-08-02 MED ORDER — DIGOXIN 125 MCG PO TABS
0.1250 mg | ORAL_TABLET | Freq: Every day | ORAL | Status: DC
  Administered 2016-08-02 – 2016-08-04 (×3): 0.125 mg via ORAL
  Filled 2016-08-02 (×4): qty 1

## 2016-08-02 MED ORDER — DIGOXIN 0.25 MG/ML IJ SOLN
0.2500 mg | Freq: Once | INTRAMUSCULAR | Status: AC
  Administered 2016-08-02: 0.25 mg via INTRAVENOUS
  Filled 2016-08-02 (×2): qty 2

## 2016-08-02 MED ORDER — VITAMIN B-1 100 MG PO TABS
100.0000 mg | ORAL_TABLET | Freq: Every day | ORAL | Status: DC
  Administered 2016-08-02 – 2016-08-04 (×3): 100 mg via ORAL
  Filled 2016-08-02 (×3): qty 1

## 2016-08-02 MED ORDER — SACUBITRIL-VALSARTAN 24-26 MG PO TABS
1.0000 | ORAL_TABLET | Freq: Two times a day (BID) | ORAL | Status: DC
  Administered 2016-08-02 – 2016-08-04 (×5): 1 via ORAL
  Filled 2016-08-02 (×5): qty 1

## 2016-08-02 NOTE — Progress Notes (Signed)
Subjective:  Denies any chest pain or shortness of breath.  Objective:  Vital Signs in the last 24 hours: Temp:  [97.8 F (36.6 C)-98.4 F (36.9 C)] 98.4 F (36.9 C) (01/16 1100) Pulse Rate:  [87-111] 93 (01/16 1200) Resp:  [14-31] 31 (01/16 1200) BP: (84-142)/(56-95) 107/76 (01/16 1200) SpO2:  [91 %-100 %] 97 % (01/16 1200) Weight:  [168 lb 8 oz (76.4 kg)] 168 lb 8 oz (76.4 kg) (01/16 0345)  Intake/Output from previous day: 01/15 0701 - 01/16 0700 In: 1310 [P.O.:360; I.V.:950] Out: 850 [Urine:850] Intake/Output from this shift: No intake/output data recorded.  Physical Exam: Neck: no adenopathy, no carotid bruit, no JVD and supple, symmetrical, trachea midline Lungs: clear to auscultation bilaterally Heart: regular rate and rhythm, S1, S2 normal and 2/6 systolic murmur noted Abdomen: soft, non-tender; bowel sounds normal; no masses,  no organomegaly Extremities: extremities normal, atraumatic, no cyanosis or edema  Lab Results:  Recent Labs  07/31/16 1502 08/01/16 0637  WBC 3.3* 4.9  HGB 14.1 13.8  PLT 104* 113*    Recent Labs  07/31/16 1502 08/01/16 0637  NA 141 138  K 4.8 4.5  CL 109 111  CO2 23 18*  GLUCOSE 92 119*  BUN 29* 26*  CREATININE 1.88* 1.57*    Recent Labs  07/31/16 2340 08/01/16 0637  TROPONINI <0.03 <0.03   Hepatic Function Panel  Recent Labs  07/31/16 1502  PROT 6.7  ALBUMIN 3.8  AST 30  ALT 18  ALKPHOS 108  BILITOT 1.7*    Recent Labs  07/31/16 1505  CHOL 111   No results for input(s): PROTIME in the last 72 hours.  Imaging: Imaging results have been reviewed and Dg Chest Port 1 View  Result Date: 07/31/2016 CLINICAL DATA:  Central line placement.  Bradycardia. EXAM: PORTABLE CHEST 1 VIEW COMPARISON:  July 04, 2016 FINDINGS: A right IJ sheath terminates in the right brachiocephalic vein, just above the confluence. No pneumothorax. Cardiomegaly is stable. The hila and mediastinum are unremarkable. No nodules,  masses, or focal infiltrates. Minimal interstitial prominence without overt edema. IMPRESSION: Cardiomegaly. Mild pulmonary venous congestion not excluded. A right IJ sheath terminates in the right brachiocephalic vein, just above the confluence with no pneumothorax. Electronically Signed   By: Dorise Bullion III M.D   On: 07/31/2016 16:10    Cardiac Studies:  Assessment/Plan:  Status post syncope probably secondary to orthostatic hypotension Hypotensive shock probably secondary to medications Dehydration Nonischemic dilated cardiomyopathy Hypertension History of EtOH and cocaine abuse in the past History of non-Q-wave MI in the past the setting of cocaine abuse in the past Chronic kidney disease stage II Plan DC central line. Advance diet as per orders. Start Entresto as per orders.  Start digoxin as per orders. Transfer to telemetry   LOS: 2 days    [name] 08/02/2016, 12:30 PM

## 2016-08-03 LAB — CBC
HCT: 36.9 % — ABNORMAL LOW (ref 39.0–52.0)
Hemoglobin: 12 g/dL — ABNORMAL LOW (ref 13.0–17.0)
MCH: 33.1 pg (ref 26.0–34.0)
MCHC: 32.5 g/dL (ref 30.0–36.0)
MCV: 101.9 fL — ABNORMAL HIGH (ref 78.0–100.0)
Platelets: 99 10*3/uL — ABNORMAL LOW (ref 150–400)
RBC: 3.62 MIL/uL — ABNORMAL LOW (ref 4.22–5.81)
RDW: 15.6 % — ABNORMAL HIGH (ref 11.5–15.5)
WBC: 4 10*3/uL (ref 4.0–10.5)

## 2016-08-03 LAB — BASIC METABOLIC PANEL
Anion gap: 8 (ref 5–15)
BUN: 23 mg/dL — ABNORMAL HIGH (ref 6–20)
CALCIUM: 8.8 mg/dL — AB (ref 8.9–10.3)
CO2: 19 mmol/L — AB (ref 22–32)
CREATININE: 1.51 mg/dL — AB (ref 0.61–1.24)
Chloride: 109 mmol/L (ref 101–111)
GFR calc non Af Amer: 48 mL/min — ABNORMAL LOW (ref >60)
GFR, EST AFRICAN AMERICAN: 55 mL/min — AB (ref >60)
GLUCOSE: 92 mg/dL (ref 65–99)
Potassium: 4.8 mmol/L (ref 3.5–5.1)
Sodium: 136 mmol/L (ref 135–145)

## 2016-08-03 MED ORDER — FUROSEMIDE 40 MG PO TABS
40.0000 mg | ORAL_TABLET | Freq: Two times a day (BID) | ORAL | Status: DC
  Administered 2016-08-03 – 2016-08-04 (×2): 40 mg via ORAL
  Filled 2016-08-03 (×2): qty 1

## 2016-08-03 NOTE — Telephone Encounter (Addendum)
[facility] should be contacted as requested

## 2016-08-03 NOTE — Progress Notes (Signed)
Subjective:  Denies any chest pain or shortness of breath. Denies palpations. No further episodes of syncope or hypotension.  Objective:  Vital Signs in the last 24 hours: Temp:  [97.6 F (36.4 C)-97.9 F (36.6 C)] 97.8 F (36.6 C) (01/17 0537) Pulse Rate:  [90-96] 90 (01/17 0537) Resp:  [19-31] 19 (01/17 0537) BP: (107-122)/(70-88) 122/81 (01/17 0537) SpO2:  [96 %-100 %] 100 % (01/17 0537) Weight:  [171 lb 4.8 oz (77.7 kg)] 171 lb 4.8 oz (77.7 kg) (01/17 0537)  Intake/Output from previous day: 01/16 0701 - 01/17 0700 In: 240 [P.O.:240] Out: 200 [Urine:200] Intake/Output from this shift: No intake/output data recorded.  Physical Exam: Neck: no adenopathy, no carotid bruit, no JVD and supple, symmetrical, trachea midline Lungs: Decreased breath sound at bases Heart: regular rate and rhythm, S1, S2 normal and 2/6 systolic murmur noted Abdomen: soft, non-tender; bowel sounds normal; no masses,  no organomegaly Extremities: extremities normal, atraumatic, no cyanosis or edema  Lab Results:  Recent Labs  08/01/16 0637 08/03/16 0234  WBC 4.9 4.0  HGB 13.8 12.0*  PLT 113* 99*    Recent Labs  08/01/16 0637 08/03/16 0234  NA 138 136  K 4.5 4.8  CL 111 109  CO2 18* 19*  GLUCOSE 119* 92  BUN 26* 23*  CREATININE 1.57* 1.51*    Recent Labs  07/31/16 2340 08/01/16 0637  TROPONINI <0.03 <0.03   Hepatic Function Panel  Recent Labs  07/31/16 1502  PROT 6.7  ALBUMIN 3.8  AST 30  ALT 18  ALKPHOS 108  BILITOT 1.7*    Recent Labs  07/31/16 1505  CHOL 111   No results for input(s): PROTIME in the last 72 hours.  Imaging: Imaging results have been reviewed and No results found.  Cardiac Studies:  Assessment/Plan:  Status post syncope probably secondary to orthostatic hypotension Status post Hypotensive shock probably secondary to medications Status post Dehydration Nonischemic dilated cardiomyopathy Hypertension History of EtOH and cocaine abuse in  the past History of non-Q-wave MI in the past the setting of cocaine abuse in the past Chronic kidney disease stage II Plan Restart Lasix as per orders Increase ambulation Possible discharge tomorrow if stable  LOS: 3 days    Charolette Forward 08/03/2016, 11:34 AM

## 2016-08-04 MED ORDER — SACUBITRIL-VALSARTAN 24-26 MG PO TABS: 1.0000 | ORAL_TABLET | Freq: Two times a day (BID) | ORAL | 3 refills | Status: DC

## 2016-08-04 MED ORDER — FUROSEMIDE 80 MG PO TABS: 40.0000 mg | ORAL_TABLET | Freq: Two times a day (BID) | ORAL | 3 refills | Status: DC

## 2016-08-04 MED ORDER — ALLOPURINOL 300 MG PO TABS: 150.0000 mg | ORAL_TABLET | Freq: Every day | ORAL | 3 refills | Status: DC

## 2016-08-04 MED ORDER — DIGOXIN 125 MCG PO TABS: 0.1250 mg | ORAL_TABLET | Freq: Every day | ORAL | 3 refills | Status: DC

## 2016-08-04 NOTE — Care Management Note (Addendum)
Case Management Note  Patient Details  Name: [name] MRN: [id number] Date of Birth: 08/19/1955  Subjective/Objective:  Pt admitted with syncope- tx from 4N to 2W on 08/02/16                  Action/Plan: PTA pt lived in [place]- plan to return home- order placed for cane-DME- have notified Shannon with [facility] for DME need- cane to be delivered to room prior to discharge. Referral called to [facility] with [facility] to see if pt eligible for services with [facility]- pt is followed at [facility].   Expected Discharge Date:  08/04/16               Expected Discharge Plan:  [name]  In-House Referral:     Discharge planning Services  CM Consult  Post Acute Care Choice:  Durable Medical Equipment Choice offered to:  Patient  DME Arranged:  Kasandra Knudsen DME Agency:  [place]:    [facility] Agency:     Status of Service:  Completed, signed off  If discussed at [place] of Stay Meetings, dates discussed:    Additional Comments:  1/18./18- 1300- Marvetta Gibbons RN, CM- received return call from [place] with [facility]- pt is already active with [facility]-per Joelene Millin they are in the process of getting pt set up with tele-monitoring with pt's PCP. She will f/u with community CM that is working with pt.   Dawayne Patricia, RN 08/04/2016, 12:29 PM

## 2016-08-04 NOTE — Progress Notes (Signed)
Patient had complaints of chest pain mid sternum pain rating was 3/10. EKG was taken. Vitals were stable. Pt said he self medicated with his nitro from his clothing and no longer have complaints of pain. Pt does not appear to be in any acute distress. MD notified. Will continue to assess pt.

## 2016-08-04 NOTE — Discharge Summary (Signed)
Discharge summary dictated on 08/04/2016 dictation number is [phone]

## 2016-08-04 NOTE — Discharge Instructions (Signed)
Heart Failure °Heart failure is a condition in which the heart has trouble pumping blood because it has become weak or stiff. This means that the heart does not pump blood efficiently for the body to work well. For some people with heart failure, fluid may back up into the lungs and there may be swelling (edema) in the lower legs. Heart failure is usually a long-term (chronic) condition. It is important for you to take good care of yourself and follow the treatment plan from your health care provider. °What are the causes? °This condition is caused by some health problems, including: °· High blood pressure (hypertension). Hypertension causes the heart muscle to work harder than normal. High blood pressure eventually causes the heart to become stiff and weak. °· Coronary artery disease (CAD). CAD is the buildup of cholesterol and fat (plaques) in the arteries of the heart. °· Heart attack (myocardial infarction). Injured tissue, which is caused by the heart attack, does not contract as well and the heart's ability to pump blood is weakened. °· Abnormal heart valves. When the heart valves do not open and close properly, the heart muscle must pump harder to keep the blood flowing. °· Heart muscle disease (cardiomyopathy or myocarditis). Heart muscle disease is damage to the heart muscle from a variety of causes, such as drug or alcohol abuse, infections, or unknown causes. These can increase the risk of heart failure. °· Lung disease. When the lungs do not work properly, the heart must work harder. °What increases the risk? °Risk of heart failure increases as a person ages. This condition is also more likely to develop in people who: °· Are overweight. °· Are male. °· Smoke or chew tobacco. °· Abuse alcohol or illegal drugs. °· Have taken medicines that can damage the heart, such as chemotherapy drugs. °· Have diabetes. °¨ High blood sugar (glucose) is associated with high fat (lipid) levels in the blood. °¨ Diabetes  can also damage tiny blood vessels that carry nutrients to the heart muscle. °· Have abnormal heart rhythms. °· Have thyroid problems. °· Have low blood counts (anemia). °What are the signs or symptoms? °Symptoms of this condition include: °· Shortness of breath with activity, such as when climbing stairs. °· Persistent cough. °· Swelling of the feet, ankles, legs, or abdomen. °· Unexplained weight gain. °· Difficulty breathing when lying flat (orthopnea). °· Waking from sleep because of the need to sit up and get more air. °· Rapid heartbeat. °· Fatigue and loss of energy. °· Feeling light-headed, dizzy, or close to fainting. °· Loss of appetite. °· Nausea. °· Increased urination during the night (nocturia). °· Confusion. °How is this diagnosed? °This condition is diagnosed based on: °· Medical history, symptoms, and a physical exam. °· Diagnostic tests, which may include: °¨ Echocardiogram. °¨ Electrocardiogram (ECG). °¨ Chest X-ray. °¨ Blood tests. °¨ Exercise stress test. °¨ Radionuclide scans. °¨ Cardiac catheterization and angiogram. °How is this treated? °Treatment for this condition is aimed at managing the symptoms of heart failure. Medicines, behavioral changes, or other treatments may be necessary to treat heart failure. °Medicines  °These may include: °· Angiotensin-converting enzyme (ACE) inhibitors. This type of medicine blocks the effects of a blood protein called angiotensin-converting enzyme. ACE inhibitors relax (dilate) the blood vessels and help to lower blood pressure. °· Angiotensin receptor blockers (ARBs). This type of medicine blocks the actions of a blood protein called angiotensin. ARBs dilate the blood vessels and help to lower blood pressure. °· Water pills (diuretics). Diuretics   cause the kidneys to remove salt and water from the blood. The extra fluid is removed through urination, leaving a lower volume of blood that the heart has to pump.  Beta blockers. These improve heart muscle  strength and they prevent the heart from beating too quickly.  Digoxin. This increases the force of the heartbeat. Healthy behavior changes  These may include:  Reaching and maintaining a healthy weight.  Stopping smoking or chewing tobacco.  Eating heart-healthy foods.  Limiting or avoiding alcohol.  Stopping use of street drugs (illegal drugs).  Physical activity. Other treatments  These may include:  Surgery to open blocked coronary arteries or repair damaged heart valves.  Placement of a biventricular pacemaker to improve heart muscle function (cardiac resynchronization therapy). This device paces both the right ventricle and left ventricle.  Placement of a device to treat serious abnormal heart rhythms (implantable cardioverter defibrillator, or ICD).  Placement of a device to improve the pumping ability of the heart (left ventricular assist device, or LVAD).  Heart transplant. This can cure heart failure, and it is considered for certain patients who do not improve with other therapies. Follow these instructions at home: Medicines  Take over-the-counter and prescription medicines only as told by your health care provider. Medicines are important in reducing the workload of your heart, slowing the progression of heart failure, and improving your symptoms.  Do not stop taking your medicine unless your health care provider told you to do that.  Do not skip any dose of medicine.  Refill your prescriptions before you run out of medicine. You need your medicines every day. Eating and drinking  Eat heart-healthy foods. Talk with a dietitian to make an eating plan that is right for you.  Choose foods that contain no trans fat and are low in saturated fat and cholesterol. Healthy choices include fresh or frozen fruits and vegetables, fish, lean meats, legumes, fat-free or low-fat dairy products, and whole-grain or high-fiber foods.  Limit salt (sodium) if directed by your  health care provider. Sodium restriction may reduce symptoms of heart failure. Ask a dietitian to recommend heart-healthy seasonings.  Use healthy cooking methods instead of frying. Healthy methods include roasting, grilling, broiling, baking, poaching, steaming, and stir-frying.  Limit your fluid intake if directed by your health care provider. Fluid restriction may reduce symptoms of heart failure. Lifestyle  Stop smoking or using chewing tobacco. Nicotine and tobacco can damage your heart and your blood vessels. Do not use nicotine gum or patches before talking to your health care provider.  Limit alcohol intake to no more than 1 drink per day for non-pregnant women and 2 drinks per day for men. One drink equals 12 oz of beer, 5 oz of wine, or 1 oz of hard liquor.  Drinking more than that is harmful to your heart. Tell your health care provider if you drink alcohol several times a week.  Talk with your health care provider about whether any level of alcohol use is safe for you.  If your heart has already been damaged by alcohol or you have severe heart failure, drinking alcohol should be stopped completely.  Stop use of illegal drugs.  Lose weight if directed by your health care provider. Weight loss may reduce symptoms of heart failure.  Do moderate physical activity if directed by your health care provider. People who are elderly and people with severe heart failure should consult with a health care provider for physical activity recommendations. Monitor important information  Weigh  yourself every day. Keeping track of your weight daily helps you to notice excess fluid sooner.  Weigh yourself every morning after you urinate and before you eat breakfast.  Wear the same amount of clothing each time you weigh yourself.  Record your daily weight. Provide your health care provider with your weight record.  Monitor and record your blood pressure as told by your health care  provider.  Check your pulse as told by your health care provider. Dealing with extreme temperatures  If the weather is extremely hot:  Avoid vigorous physical activity.  Use air conditioning or fans or seek a cooler location.  Avoid caffeine and alcohol.  Wear loose-fitting, lightweight, and light-colored clothing.  If the weather is extremely cold:  Avoid vigorous physical activity.  Layer your clothes.  Wear mittens or gloves, a hat, and a scarf when you go outside.  Avoid alcohol. General instructions  Manage other health conditions such as hypertension, diabetes, thyroid disease, or abnormal heart rhythms as told by your health care provider.  Learn to manage stress. If you need help to do this, ask your health care provider.  Plan rest periods when fatigued.  Get ongoing education and support as needed.  Participate in or seek rehabilitation as needed to maintain or improve independence and quality of life.  Stay up to date with immunizations. Keeping current on pneumococcal and influenza immunizations is especially important to prevent respiratory infections.  Keep all follow-up visits as told by your health care provider. This is important. Contact a health care provider if:  You have a rapid weight gain.  You have increasing shortness of breath that is unusual for you.  You are unable to participate in your usual physical activities.  You tire easily.  You cough more than normal, especially with physical activity.  You have any swelling or more swelling in areas such as your hands, feet, ankles, or abdomen.  You are unable to sleep because it is hard to breathe.  You feel like your heart is beating quickly (palpitations).  You become dizzy or light-headed when you stand up. Get help right away if:  You have difficulty breathing.  You notice or your family notices a change in your awareness, such as having trouble staying awake or having difficulty  with concentration.  You have pain or discomfort in your chest.  You have an episode of fainting (syncope). This information is not intended to replace advice given to you by your health care provider. Make sure you discuss any questions you have with your health care provider. Document Released: 07/04/2005 Document Revised: 03/08/2016 Document Reviewed: 01/27/2016 Elsevier Interactive Patient Education  2017 Else[employer]. Near-Syncope Introduction Near-syncope is when you suddenly become weak or dizzy, or you feel like you might pass out (faint). During an episode of near-syncope, you may:  Feel dizzy or light-headed.  Feel nauseous.  See all white or all black in your field of vision.  Have cold, clammy skin. This condition is caused by a sudden decrease in blood flow to the brain. This decrease can result from various causes, but most of those causes are not dangerous. However, near-syncope can be a sign of a serious medical problem, so it is important to seek medical care. If you fainted, get medical help right away.Call your local emergency services (911 in the [place] Do not drive yourself to the hospital. Follow these instructions at home: Pay attention to any changes in your symptoms. Take these actions to help with  your condition:  Have someone stay with you until you feel stable.  Do not drive, use machinery, or play sports until your health care provider says it is okay.  Keep all follow-up visits as told by your health care provider. This is important.  If you start to feel like you might faint, lie down right away and raise (elevate) your feet above the level of your heart. Breathe deeply and steadily. Wait until all of the symptoms have passed.  Drink enough fluid to keep your urine clear or pale yellow.  If you are taking blood pressure or heart medicine, get up slowly and take several minutes to sit and then stand. This can reduce dizziness.  Take over-the-counter  and prescription medicines only as told by your health care provider. Get help right away if:  You have a severe headache.  You have unusual pain in your chest, abdomen, or back.  You are bleeding from your mouth or rectum, or you have black or tarry stool.  You have a very fast or irregular heartbeat (palpitations).  You faint once or repeatedly.  You have a seizure.  You are confused.  You have trouble walking.  You have severe weakness.  You have vision problems. These symptoms may represent a serious problem that is an emergency. Do not wait to see if your symptoms will go away. Get medical help right away. Call your local emergency services (911 in the [place] Do not drive yourself to the hospital.  This information is not intended to replace advice given to you by your health care provider. Make sure you discuss any questions you have with your health care provider. Document Released: 07/04/2005 Document Revised: 12/10/2015 Document Reviewed: 03/18/2015  2017 Elsevier

## 2016-08-04 NOTE — Progress Notes (Addendum)
Patient in a stable condition ,discharge education completed with patient ,pt verbalised understanding, patient belongings at bedside, cane delivered to room,bus pass ticket given to patient iv removed ,[facility] notified. Patient taken off the unit by this RN

## 2016-08-05 NOTE — Telephone Encounter (Signed)
Done

## 2016-08-05 NOTE — Discharge Summary (Signed)
[name], [name]              ACCOUNT NO.:  [id number]  MEDICAL RECORD NO.:  [id number]  LOCATION:                                 FACILITY:  PHYSICIAN:  Keeyon Privitera N. Terrence Dupont, M.D. DATE OF BIRTH:  [birth year]  DATE OF ADMISSION:  07/31/2016 DATE OF DISCHARGE:  08/04/2016                              DISCHARGE SUMMARY   ADMITTING DIAGNOSES: 1. Syncope. 2. Dehydration. 3. Nonischemic dilated cardiomyopathy. 4. History of alcohol abuse. 5. Chronic kidney disease.  FINAL DIAGNOSES: 1. Status post syncope, probably secondary to orthostatic     hypotension/medications. 2. Status post hypotensive shock secondary to medication. 3. Status post dehydration. 4. Nonischemic dilated cardiomyopathy. 5. Compensated systolic congestive heart failure. 6. Hypertension. 7. History of EtOH and cocaine abuse in the past. 8. History of non-Q-wave myocardial infarction in the past in the     setting of cocaine abuse in the past. 9. Chronic kidney disease, stage II.  DISCHARGE MEDICATIONS: 1. Digoxin 0.125 mg one tablet daily. 2. Entresto 24/26 mg one tablet twice daily. 3. Albuterol inhaler one-two puffs every 6 hours as before. 4. Aspirin 81 mg one tablet daily. 5. Atorvastatin 80 mg one tablet daily. 6. Clopidogrel 75 mg one tablet daily. 7. Flonase two sprays in both nostrils as needed. 8. Multivitamin with minerals one tablet daily. 9. Nitrostat sublingual p.r.n. 10.Paxil 20 mg one tablet daily. 11.Elimite 5% cream, apply locally as before. 12.Potassium chloride 20 mEq daily. 13.Ranitidine 150 mg twice daily. 14.Kenalog, apply locally as before as needed. 15.Allopurinol 150 mg daily. 16.Furosemide 40 mg twice daily.  DIET:  Low salt, low cholesterol.  ACTIVITY:  Increase activity as tolerated.  DISCHARGE INSTRUCTIONS:  The patient has been advised to monitor blood pressure and weight daily.  Heart failure instructions have been given.  CONDITION AT DISCHARGE:   Stable.  FOLLOWUP:  Follow up with me in 1 week.  BRIEF HISTORY AND HOSPITAL COURSE:  [name] is a 61 year old male with past medical history significant for dilated nonischemic cardiomyopathy, history of polysubstance abuse, had syncopal episode after returning from church this a.m.  He had history of alcohol use and occasional cocaine use per the patient.  He was bradycardic and hypotensive in the ER.  Fluid resuscitation, blood pressure and heart rate improved.  He was given [facility] for hypothermia.  Currently, he is awake, denies any chest pain or palpitation prior to passing out.  PHYSICAL EXAMINATION:  GENERAL APPEARANCE:  On examination, he was alert, awake, cooperative. VITAL SIGNS:  Blood pressure was 199/65, pulse 72, temperature was 93.9 degrees Fahrenheit. HEENT:  Head:  Normocephalic, atraumatic.  Conjunctiva, brown eyes. Conjunctival cornea clear. NECK:  No supple.  No JVD.  Trachea was midline. LUNGS:  Clear to auscultation. CARDIOVASCULAR:  S1, S2 was normal.  There was 2/6 systolic murmur.  No click, rub or gallop. ABDOMEN:  Soft.  Bowel sounds present.  Nontender. EXTREMITIES:  There was no clubbing, cyanosis, or edema. NEUROLOGIC:  Grossly intact.  LABORATORY DATA:  Sodium was 141, potassium 4.8, BUN 29, creatinine 1.88.  His BNP was 4050.  Hemoglobin was 14.1, hematocrit 42.3, white count of 3.3.  Three sets of troponin-I were negative.  Last labs yesterday; sodium was 136, potassium 4.8, BUN 23, creatinine 1.51. Hemoglobin was 12, hematocrit 36.9, white count of 4.0, platelet count of 99,000, which has been stable.  TSH was 1.94.  EKG showed normal sinus rhythm with left anterior fascicular block and right bundle-branch block.  There were no significant new acute ischemic changes noted.  BRIEF HOSPITAL COURSE:  The patient was admitted to Step-Down Unit, was started on IV dopamine and received fluid bolus with improvement in his blood pressure.   The patient did not have any episodes of cardiac arrhythmias during the hospital stay.  His carvedilol and Entresto were held, and Lasix was held with improvement in his blood pressure. Dopamine was slowly weaned off.  The patient is off dopamine for more than 24 hours now.  His Delene Loll has been restarted, digoxin has been added and also Lasix dose has been reduced.  His blood pressure is staying stable.  The patient is ambulating in the room without any problems.  The patient will be discharged home on above medications and will be followed up in my office in 1 week.  We will restart his beta- blocker if his blood pressure tolerates as outpatient.     Allegra Lai. Terrence Dupont, M.D.   ______________________________ Allegra Lai. Terrence Dupont, M.D.    MNH/MEDQ  D:  08/04/2016  T:  08/05/2016  Job:  [id number]

## 2016-08-05 NOTE — Telephone Encounter (Signed)
Writer called [facility] back and LVM to let him know that a prescritption for the cane has been obtained from Dr. Jarold Song.  Requesting a call back asking where it should be faxed.

## 2016-08-09 ENCOUNTER — Telehealth: Payer: Self-pay | Admitting: Family Medicine

## 2016-08-09 NOTE — Telephone Encounter (Signed)
Santiago Glad from [employer] [facility] [employer] called to speak with PCP regarding an order that she needs her for a digital scale for patient. Santiago Glad also stated that patient doesn't have access to an albuterol pump. Please follow up.   Fax. [phone]   Thank you.

## 2016-08-11 NOTE — Telephone Encounter (Signed)
Prescription faxes to [facility].

## 2016-08-12 NOTE — Telephone Encounter (Signed)
I refilled his albuterol inhaler at his last visit. Prescription for scale written.

## 2016-08-12 NOTE — Progress Notes (Signed)
Dr. Jarold Song wrote script for a scale for patient. Script faxed to Santiago Glad with [place] [phone]). No additional needs identified.

## 2016-08-15 ENCOUNTER — Other Ambulatory Visit: Payer: Self-pay | Admitting: Family Medicine

## 2016-08-15 DIAGNOSIS — R0981 Nasal congestion: Secondary | ICD-10-CM

## 2016-08-15 NOTE — Telephone Encounter (Signed)
Scale prescription faxed by Carmela Hurt, RN

## 2016-08-25 ENCOUNTER — Telehealth: Payer: Self-pay | Admitting: Family Medicine

## 2016-08-25 NOTE — Telephone Encounter (Signed)
Anne from [employer] [facility] [employer] called the office to inform PCP that she has scheduled a home visit for patient. On this visit they will be setting up a tele monitoring machine which will help him take his bp, heart rate, weight, etc. Appt is on 2/15 at 2:30pm.  Thank you.

## 2016-08-29 NOTE — Telephone Encounter (Signed)
MD aware of the tele monitoring being set up.

## 2016-09-09 ENCOUNTER — Telehealth: Payer: Self-pay | Admitting: Family Medicine

## 2016-09-09 NOTE — Telephone Encounter (Signed)
Webb Silversmith, from [employer] [facility] [employer], called the office to inform PCP that a tele monitoring was set up in patient's home.  Thank you.

## 2016-09-21 ENCOUNTER — Telehealth: Payer: Self-pay | Admitting: Family Medicine

## 2016-09-21 NOTE — Telephone Encounter (Signed)
[name] called to speak with PCP regarding patient's weight. The patient's weight has decreased. Last weight in was 167lb dueing office visit. [name] says last weight in (with them) was 139.  Prior to that it was in the 140's. Please follow up.  Thank you.

## 2016-09-22 ENCOUNTER — Other Ambulatory Visit: Payer: Self-pay | Admitting: Family Medicine

## 2016-09-22 NOTE — Telephone Encounter (Signed)
He was supposed to follow-up one month from his last visit; he needs an office visit

## 2016-09-22 NOTE — Telephone Encounter (Signed)
Writer gave msg to scheduler per MD to set patient up with an appt.

## 2016-09-26 ENCOUNTER — Ambulatory Visit: Payer: Medicaid Other | Admitting: Family Medicine

## 2016-09-29 ENCOUNTER — Ambulatory Visit: Payer: Medicaid Other | Admitting: Family Medicine

## 2016-09-29 ENCOUNTER — Telehealth: Payer: Self-pay | Admitting: Family Medicine

## 2016-09-29 NOTE — Telephone Encounter (Signed)
Could you please inform the nurse that the patient missed his appointment today and needs to seen in the office.

## 2016-09-29 NOTE — Telephone Encounter (Signed)
Anne from [facility] calling to inform PCP that pt has had a 6 1/2 pound weight gain and denies any symptoms  Requesting a call back from nurse to discuss patients overall weight

## 2016-10-03 ENCOUNTER — Telehealth: Payer: Self-pay | Admitting: Family Medicine

## 2016-10-03 NOTE — Telephone Encounter (Signed)
Spk to pt and nurse, admits to SOB more at night and cough.  Weight 140 lbs. On tele  Monitoring device at home.  Last OV in January was 167 lbs. States he uses his inhaler but not helping.  BP is better. He states he has dizziness if he stands up too fast. Denies chest pain. Office visit made for patient. Pt aware of OV/apt.

## 2016-10-03 NOTE — Telephone Encounter (Signed)
Anne from [employer] called to speak with nurse regarding patient's status. Pt is experiencing shortness of breath. He has been using his inhaler more frequently at night time. He has a persistent cough. Please follow up.  Thank you.

## 2016-10-05 ENCOUNTER — Ambulatory Visit: Payer: Medicaid Other | Admitting: Family Medicine

## 2016-10-05 NOTE — Telephone Encounter (Signed)
Patient has a scheduled appt today

## 2016-10-10 ENCOUNTER — Other Ambulatory Visit: Payer: Self-pay | Admitting: Family Medicine

## 2016-10-17 ENCOUNTER — Telehealth: Payer: Self-pay | Admitting: Family Medicine

## 2016-10-17 NOTE — Telephone Encounter (Signed)
Called pt. And he did not pick up the phone. Pt. Needs to schedule f/u appointment.

## 2016-11-01 ENCOUNTER — Ambulatory Visit: Payer: Medicaid Other | Admitting: Family Medicine

## 2016-11-14 ENCOUNTER — Telehealth: Payer: Self-pay | Admitting: Family Medicine

## 2016-11-14 NOTE — Telephone Encounter (Signed)
Anne from [employer] called today to advise that current tele monitoring orders are expired and would like to know if they will be renewed. States that the paperwork was faxed over for Dr Jarold Song to sign at a prior date (not sure when). Requests a f/u as quickly as possible in order to know wether to continue services or not. Please f/u, thank you.

## 2016-11-14 NOTE — Telephone Encounter (Signed)
I haven't seen any paperwork regarding this patient. He needs to come in for an appointment; telemetry monitoring will not be renewed.

## 2016-11-15 ENCOUNTER — Encounter: Payer: Self-pay | Admitting: Family Medicine

## 2016-11-15 ENCOUNTER — Telehealth: Payer: Self-pay

## 2016-11-15 ENCOUNTER — Ambulatory Visit: Payer: Medicaid Other | Attending: Family Medicine | Admitting: Family Medicine

## 2016-11-15 VITALS — BP 112/77 | HR 96 | Temp 97.9°F | Resp 16 | Ht 68.0 in | Wt 145.0 lb

## 2016-11-15 DIAGNOSIS — I5042 Chronic combined systolic (congestive) and diastolic (congestive) heart failure: Secondary | ICD-10-CM

## 2016-11-15 DIAGNOSIS — M109 Gout, unspecified: Secondary | ICD-10-CM | POA: Diagnosis not present

## 2016-11-15 DIAGNOSIS — F329 Major depressive disorder, single episode, unspecified: Secondary | ICD-10-CM | POA: Diagnosis not present

## 2016-11-15 DIAGNOSIS — F419 Anxiety disorder, unspecified: Secondary | ICD-10-CM | POA: Insufficient documentation

## 2016-11-15 DIAGNOSIS — L309 Dermatitis, unspecified: Secondary | ICD-10-CM | POA: Diagnosis not present

## 2016-11-15 DIAGNOSIS — J3089 Other allergic rhinitis: Secondary | ICD-10-CM

## 2016-11-15 DIAGNOSIS — J302 Other seasonal allergic rhinitis: Secondary | ICD-10-CM | POA: Diagnosis not present

## 2016-11-15 DIAGNOSIS — I252 Old myocardial infarction: Secondary | ICD-10-CM | POA: Insufficient documentation

## 2016-11-15 DIAGNOSIS — Z7982 Long term (current) use of aspirin: Secondary | ICD-10-CM | POA: Insufficient documentation

## 2016-11-15 DIAGNOSIS — Z9889 Other specified postprocedural states: Secondary | ICD-10-CM | POA: Insufficient documentation

## 2016-11-15 DIAGNOSIS — N183 Chronic kidney disease, stage 3 unspecified: Secondary | ICD-10-CM

## 2016-11-15 DIAGNOSIS — Z79899 Other long term (current) drug therapy: Secondary | ICD-10-CM | POA: Diagnosis not present

## 2016-11-15 DIAGNOSIS — I10 Essential (primary) hypertension: Secondary | ICD-10-CM

## 2016-11-15 DIAGNOSIS — M10472 Other secondary gout, left ankle and foot: Secondary | ICD-10-CM | POA: Diagnosis not present

## 2016-11-15 DIAGNOSIS — I130 Hypertensive heart and chronic kidney disease with heart failure and stage 1 through stage 4 chronic kidney disease, or unspecified chronic kidney disease: Secondary | ICD-10-CM | POA: Diagnosis present

## 2016-11-15 MED ORDER — CETIRIZINE HCL 10 MG PO TABS: 10.0000 mg | ORAL_TABLET | Freq: Every day | ORAL | 1 refills | Status: DC

## 2016-11-15 MED ORDER — ALLOPURINOL 300 MG PO TABS: 150.0000 mg | ORAL_TABLET | Freq: Every day | ORAL | 3 refills | Status: DC

## 2016-11-15 MED ORDER — BETAMETHASONE VALERATE 0.1 % EX CREA: TOPICAL_CREAM | CUTANEOUS | 1 refills | Status: DC

## 2016-11-15 NOTE — Progress Notes (Signed)
Subjective:  Patient ID: [name], male    DOB: 1956/05/07  Age: 61 y.o. MRN: [id number]  CC: Follow-up (recert for telemonitoring)   HPI [name]  is a 61 year old male with a history of CHF (EF 20-25% from 2-D echo of 05/2016), chronic kidney disease stage III, Gout, dermatitis who presents today for a follow-up visit. He was last seen 4 months ago and has not been to see his cardiologist in a while; it appears he missed his appointment which was scheduled for 2 weeks ago.  Currently under telemetry monitoring with questions about recertifying. He checks his weight at home and reports weights between 140-145. He is chronically short of breath and mild exertion but denies pedal edema. States he has been compliant with his medications.  Denies gout flares  He complains of cracking of the skin of both he; he was seen by dermatology for dermatitis and had been placed on fluocinonide which he has run out of  Past Medical History:  Diagnosis Date  . Anxiety   . Arthritis    "all over" (01/07/2015)  . CKD (chronic kidney disease), stage IV ([place])   . Depression   . Headache    "probably weekly" (01/07/2015)  . Heart attack ([place]) 07/2014  . History of echocardiogram 12/2014   EF 20-25%  . Homelessness   . Hypercholesterolemia   . Hypertension   . Ischemic dilated cardiomyopathy ([place])   . Noncompliance   . NSTEMI (non-ST elevated myocardial infarction) ([place]) 01/07/2015  . Polysubstance abuse    etoh, cocaine  . Urinary hesitancy   . Walking pneumonia 07/2014    Past Surgical History:  Procedure Laterality Date  . CARDIAC CATHETERIZATION    . LEFT HEART CATHETERIZATION WITH CORONARY ANGIOGRAM N/A 08/15/2014   Procedure: LEFT HEART CATHETERIZATION WITH CORONARY ANGIOGRAM;  Surgeon: Clent Demark, MD;  Location: [facility] C[facility] LAB;  Service: Cardiovascular;  Laterality: N/A;    Allergies  Allergen Reactions  . Tape Rash     Outpatient Medications Prior to Visit    Medication Sig Dispense Refill  . aspirin EC 81 MG EC tablet Take 1 tablet (81 mg total) by mouth daily. 30 tablet 3  . atorvastatin (LIPITOR) 80 MG tablet Take 80 mg by mouth daily.    . digoxin (LANOXIN) 0.125 MG tablet Take 1 tablet (0.125 mg total) by mouth daily. 30 tablet 3  . fluticasone (FLONASE) 50 MCG/ACT nasal spray Place 2 sprays into both nostrils daily. 16 g 2  . Multiple Vitamin (MULTIVITAMIN WITH MINERALS) TABS tablet Take 1 tablet by mouth daily.    . nitroGLYCERIN (NITROSTAT) 0.4 MG SL tablet Place 1 tablet (0.4 mg total) under the tongue every 5 (five) minutes x 3 doses as needed for chest pain. 25 tablet 0  . PARoxetine (PAXIL) 20 MG tablet Take 1 tablet (20 mg total) by mouth daily. 30 tablet 3  . permethrin (ELIMITE) 5 % cream Apply 1 application topically See admin instructions. Apply on the skin from the neck down at bedtime  2  . potassium chloride SA (K-DUR,KLOR-CON) 20 MEQ tablet Take 20 mEq by mouth daily.  3  . PROAIR HFA 108 (90 Base) MCG/ACT inhaler Inhale 1-2 puffs into the lungs every 6 (six) hours as needed for wheezing or shortness of breath. 8.5 g 1  . ranitidine (ZANTAC) 150 MG tablet Take 1 tablet (150 mg total) by mouth 2 (two) times daily. 60 tablet 2  . sacubitril-valsartan (ENTRESTO) 24-26 MG Take  1 tablet by mouth 2 (two) times daily. 60 tablet 3  . triamcinolone cream (KENALOG) 0.1 % Apply 1 application topically 2 (two) times daily as needed (itching).   1  . allopurinol (ZYLOPRIM) 300 MG tablet Take 0.5 tablets (150 mg total) by mouth daily. 30 tablet 3  . betamethasone valerate (VALISONE) 0.1 % cream APPLY TOPICALLY 2 TIMES DAILY. 30 g 1  . clopidogrel (PLAVIX) 75 MG tablet Take 75 mg by mouth daily.    . furosemide (LASIX) 80 MG tablet Take 0.5 tablets (40 mg total) by mouth 2 (two) times daily. (Patient not taking: Reported on 11/15/2016) 60 tablet 3   No facility-administered medications prior to visit.     ROS Review of  Systems Constitutional:  Negative for activity change and appetite change.  HENT: Negative for sinus pressure and sore throat.   Eyes: Negative for visual disturbance.  Respiratory: Negative for cough, chest tightness and positive for shortness of breath which is chronic.   Cardiovascular: Negative for chest pain and leg swelling.  Gastrointestinal: No constipation, no diarrhea Endocrine: Negative.   Genitourinary: Negative for dysuria.  Musculoskeletal: Negative for joint swelling and myalgias.  Skin: Negative for rash.  Allergic/Immunologic: Negative.   Neurological: Negative for weakness, light-headedness and numbness.  Psychiatric/Behavioral: Negative for dysphoric mood and suicidal ideas.   Objective:  BP 112/77 (BP Location: Right Arm, Patient Position: Sitting, Cuff Size: Large)   Pulse 96   Temp 97.9 F (36.6 C) (Oral)   Resp 16   Ht _0  (1.727 m) Comment: w/shoes  Wt 145 lb (65.8 kg) Comment: w/shoes  SpO2 100%   BMI 22.05 kg/m   BP/Weight 11/15/2016 08/04/2016 1/60/1093  Systolic BP 235 573 -  Diastolic BP 77 80 -  Wt. (Lbs) 145 - 171.3  BMI 22.05 - 26.05      Physical Exam Constitutional: He is oriented to person, place, and time. He appears well-developed and well-nourished.  Eyes: infraorbital edema Neck: No JVD present.  Cardiovascular: Normal rate, normal heart sounds and intact distal pulses.   No murmur heard. Pulmonary/Chest: Effort normal and breath sounds normal. He has no wheezes. He has no rales. He exhibits no tenderness.  Abdominal: Soft. Bowel sounds are normal. He exhibits no distension and no mass. There is no tenderness.  Musculoskeletal: Normal range of motion.  Neurological: He is alert and oriented to person, place, and time.  Skin: Xerosis of skin of hands Psychiatric: He has a normal mood and affect.  Assessment & Plan:   1. Chronic combined systolic and diastolic congestive heart failure (HCC) EF 20-25% from 05/2016 NYHA  III Euvolemic Will not recertify for telemonitoring as he misses his appointment due to a sense of decreased need for follow-up appointments with telemonitoring in place  Emphasized need for regular appointments with PCP and cardiologist. Advised to schedule an appointment with his cardiologist. - CMP14+EGFR; Future - Lipid panel; Future - Brain natriuretic peptide; Future  2. CKD (chronic kidney disease) stage 3, GFR 30-59 ml/min We'll check renal function We'll need to exercise caution with regards to use of Lasix - Microalbumin/Creatinine Ratio, Urine; Future  3. Dermatitis Will need to follow-up with dermatology Refill topical steroid  4. Benign essential HTN Controlled  5. Other secondary acute gout of left ankle No acute flare  6. Seasonal allergic rhinitis due to other allergic trigger Evidenced by infraorbital edema Commence Zyrtec - cetirizine (ZYRTEC) 10 MG tablet; Take 1 tablet (10 mg total) by mouth daily.  Dispense: 30  tablet; Refill: 1   Meds ordered this encounter  Medications  . betamethasone valerate (VALISONE) 0.1 % cream    Sig: APPLY TOPICALLY 2 TIMES DAILY.    Dispense:  30 g    Refill:  1  . allopurinol (ZYLOPRIM) 300 MG tablet    Sig: Take 0.5 tablets (150 mg total) by mouth daily.    Dispense:  30 tablet    Refill:  3  . cetirizine (ZYRTEC) 10 MG tablet    Sig: Take 1 tablet (10 mg total) by mouth daily.    Dispense:  30 tablet    Refill:  1    Follow-up: Return in about 1 month (around 12/16/2016) for Follow-up on chronic medical conditions.   [name] MD

## 2016-11-15 NOTE — Telephone Encounter (Signed)
Writer spoke with Webb Silversmith from [employer] for [employer].  She will fax another recert form to [facility].  Writer updated patient's address and phone number.   Fax received and patient scheduled today with Dr. Jarold Song for tele recert.

## 2016-11-15 NOTE — Telephone Encounter (Signed)
Tele monitoring discontinued today by Dr. Jarold Song. [facility] [employer] notified.

## 2016-11-21 ENCOUNTER — Other Ambulatory Visit: Payer: Medicaid Other

## 2016-11-22 ENCOUNTER — Other Ambulatory Visit: Payer: Self-pay | Admitting: Family Medicine

## 2016-11-22 DIAGNOSIS — R0981 Nasal congestion: Secondary | ICD-10-CM

## 2016-12-28 ENCOUNTER — Ambulatory Visit: Payer: Medicaid Other | Admitting: Family Medicine

## 2017-01-06 ENCOUNTER — Ambulatory Visit: Payer: Medicaid Other | Attending: Family Medicine | Admitting: Family Medicine

## 2017-01-06 ENCOUNTER — Encounter: Payer: Self-pay | Admitting: Family Medicine

## 2017-01-06 VITALS — BP 91/60 | HR 88 | Temp 97.4°F | Wt 154.6 lb

## 2017-01-06 DIAGNOSIS — R399 Unspecified symptoms and signs involving the genitourinary system: Secondary | ICD-10-CM | POA: Insufficient documentation

## 2017-01-06 DIAGNOSIS — N183 Chronic kidney disease, stage 3 unspecified: Secondary | ICD-10-CM

## 2017-01-06 DIAGNOSIS — N481 Balanitis: Secondary | ICD-10-CM | POA: Diagnosis not present

## 2017-01-06 DIAGNOSIS — F419 Anxiety disorder, unspecified: Secondary | ICD-10-CM | POA: Insufficient documentation

## 2017-01-06 DIAGNOSIS — Z79899 Other long term (current) drug therapy: Secondary | ICD-10-CM | POA: Diagnosis not present

## 2017-01-06 DIAGNOSIS — M109 Gout, unspecified: Secondary | ICD-10-CM | POA: Insufficient documentation

## 2017-01-06 DIAGNOSIS — F329 Major depressive disorder, single episode, unspecified: Secondary | ICD-10-CM | POA: Diagnosis not present

## 2017-01-06 DIAGNOSIS — I10 Essential (primary) hypertension: Secondary | ICD-10-CM | POA: Diagnosis not present

## 2017-01-06 DIAGNOSIS — E7800 Pure hypercholesterolemia, unspecified: Secondary | ICD-10-CM | POA: Diagnosis not present

## 2017-01-06 DIAGNOSIS — I130 Hypertensive heart and chronic kidney disease with heart failure and stage 1 through stage 4 chronic kidney disease, or unspecified chronic kidney disease: Secondary | ICD-10-CM | POA: Diagnosis not present

## 2017-01-06 DIAGNOSIS — K5909 Other constipation: Secondary | ICD-10-CM | POA: Insufficient documentation

## 2017-01-06 DIAGNOSIS — I5042 Chronic combined systolic (congestive) and diastolic (congestive) heart failure: Secondary | ICD-10-CM

## 2017-01-06 DIAGNOSIS — I420 Dilated cardiomyopathy: Secondary | ICD-10-CM | POA: Insufficient documentation

## 2017-01-06 DIAGNOSIS — Z7982 Long term (current) use of aspirin: Secondary | ICD-10-CM | POA: Diagnosis not present

## 2017-01-06 DIAGNOSIS — I255 Ischemic cardiomyopathy: Secondary | ICD-10-CM | POA: Insufficient documentation

## 2017-01-06 DIAGNOSIS — I252 Old myocardial infarction: Secondary | ICD-10-CM | POA: Insufficient documentation

## 2017-01-06 DIAGNOSIS — Z7902 Long term (current) use of antithrombotics/antiplatelets: Secondary | ICD-10-CM | POA: Insufficient documentation

## 2017-01-06 DIAGNOSIS — R05 Cough: Secondary | ICD-10-CM | POA: Diagnosis present

## 2017-01-06 MED ORDER — CLOTRIMAZOLE 1 % EX CREA: 1.0000 "application " | TOPICAL_CREAM | Freq: Two times a day (BID) | CUTANEOUS | 0 refills | Status: DC

## 2017-01-06 MED ORDER — POLYETHYLENE GLYCOL 3350 17 G PO PACK: 17.0000 g | PACK | Freq: Every day | ORAL | 0 refills | Status: DC

## 2017-01-06 MED ORDER — ALBUTEROL SULFATE HFA 108 (90 BASE) MCG/ACT IN AERS: 1.0000 | INHALATION_SPRAY | Freq: Four times a day (QID) | RESPIRATORY_TRACT | 1 refills | Status: DC | PRN

## 2017-01-06 MED ORDER — NITROGLYCERIN 0.4 MG SL SUBL: 0.4000 mg | SUBLINGUAL_TABLET | SUBLINGUAL | 1 refills | Status: DC | PRN

## 2017-01-06 MED ORDER — TAMSULOSIN HCL 0.4 MG PO CAPS: 0.4000 mg | ORAL_CAPSULE | Freq: Every day | ORAL | 3 refills | Status: DC

## 2017-01-06 MED ORDER — ASPIRIN 81 MG PO TBEC: 81.0000 mg | DELAYED_RELEASE_TABLET | Freq: Every day | ORAL | 3 refills | Status: DC

## 2017-01-06 NOTE — Progress Notes (Signed)
Subjective:  Patient ID: [name], male    DOB: [birth year]  Age: 61 y.o. MRN: [id number]  CC: Cough   HPI [name]  is a 61 year old male with a history of CHF (EF 20-25% from 2-D echo of 05/2016), chronic kidney disease stage III, Gout, dermatitis who presents today for a follow-up visit. He was last seen 6 weeks and has not been to see his cardiologist in a while; it appears he missed previous appointments.   He complains of shortness of breath which has been progressive despite taking Lasix 80 mg twice daily but he admits to not taking today's dose because he was in a hurry. He also has orthopnea and paroxysmal nocturnal dyspnea edema of his lower extremities. Denies chest pains. I have reviewed his medications and he does not have Entresto with him; his blood pressure is only 91/60 He informs me he has an appointment with cardiology this afternoon.  He complains of swelling of his penis which has been on for the last 1-2 weeks. He has urinary hesitancy and has to strain to urinate and this sometimes causes lower abdominal discomfort.  Also endorses constipation but no nausea or vomiting.   Past Medical History:  Diagnosis Date  . Anxiety   . Arthritis    "all over" (01/07/2015)  . CKD (chronic kidney disease), stage IV ([place])   . Depression   . Headache    "probably weekly" (01/07/2015)  . Heart attack ([place]) 07/2014  . History of echocardiogram 12/2014   EF 20-25%  . Homelessness   . Hypercholesterolemia   . Hypertension   . Ischemic dilated cardiomyopathy ([place])   . Noncompliance   . NSTEMI (non-ST elevated myocardial infarction) ([place]) 01/07/2015  . Polysubstance abuse    etoh, cocaine  . Urinary hesitancy   . Walking pneumonia 07/2014    Past Surgical History:  Procedure Laterality Date  . CARDIAC CATHETERIZATION    . LEFT HEART CATHETERIZATION WITH CORONARY ANGIOGRAM N/A 08/15/2014   Procedure: LEFT HEART CATHETERIZATION WITH CORONARY ANGIOGRAM;  Surgeon:  Clent Demark, MD;  Location: [facility] CATH LAB;  Service: Cardiovascular;  Laterality: N/A;    Allergies  Allergen Reactions  . Tape Rash     Outpatient Medications Prior to Visit  Medication Sig Dispense Refill  . allopurinol (ZYLOPRIM) 300 MG tablet Take 0.5 tablets (150 mg total) by mouth daily. 30 tablet 3  . atorvastatin (LIPITOR) 80 MG tablet Take 80 mg by mouth daily.    . betamethasone valerate (VALISONE) 0.1 % cream APPLY TOPICALLY 2 TIMES DAILY. 30 g 1  . cetirizine (ZYRTEC) 10 MG tablet Take 1 tablet (10 mg total) by mouth daily. 30 tablet 1  . clopidogrel (PLAVIX) 75 MG tablet Take 75 mg by mouth daily.  3  . digoxin (LANOXIN) 0.125 MG tablet Take 1 tablet (0.125 mg total) by mouth daily. 30 tablet 3  . fluticasone (FLONASE) 50 MCG/ACT nasal spray Place 2 sprays into both nostrils daily. (120/4=30) 16 g 2  . furosemide (LASIX) 80 MG tablet Take 80 mg by mouth 2 (two) times daily.  3  . Multiple Vitamin (MULTIVITAMIN WITH MINERALS) TABS tablet Take 1 tablet by mouth daily.    Marland Kitchen PARoxetine (PAXIL) 20 MG tablet Take 1 tablet (20 mg total) by mouth daily. 30 tablet 3  . permethrin (ELIMITE) 5 % cream Apply 1 application topically See admin instructions. Apply on the skin from the neck down at bedtime  2  . potassium chloride  SA (K-DUR,KLOR-CON) 20 MEQ tablet Take 20 mEq by mouth daily.  3  . ranitidine (ZANTAC) 150 MG tablet Take 1 tablet (150 mg total) by mouth 2 (two) times daily. 60 tablet 2  . sacubitril-valsartan (ENTRESTO) 24-26 MG Take 1 tablet by mouth 2 (two) times daily. 60 tablet 3  . triamcinolone cream (KENALOG) 0.1 % Apply 1 application topically 2 (two) times daily as needed (itching).   1  . aspirin EC 81 MG EC tablet Take 1 tablet (81 mg total) by mouth daily. 30 tablet 3  . nitroGLYCERIN (NITROSTAT) 0.4 MG SL tablet Place 1 tablet (0.4 mg total) under the tongue every 5 (five) minutes x 3 doses as needed for chest pain. 25 tablet 0  . PROAIR HFA 108 (90 Base)  MCG/ACT inhaler Inhale 1-2 puffs into the lungs every 6 (six) hours as needed for wheezing or shortness of breath. 8.5 g 1   No facility-administered medications prior to visit.     ROS Review of Systems Constitutional:  Negative for activity change and appetite change.  HENT: Negative for sinus pressure and sore throat.   Eyes: Negative for visual disturbance.  Respiratory: Positive for cough, and positive for shortness of breath   Cardiovascular: Negative for chest pain and leg swelling.  Gastrointestinal: Positive for constipation, negative for diarrhea Endocrine: Negative.   Genitourinary: see hpi Musculoskeletal: Negative for joint swelling and myalgias.  Skin: Negative for rash.  Allergic/Immunologic: Negative.   Neurological: Negative for weakness, light-headedness and numbness.  Psychiatric/Behavioral: Negative for dysphoric mood and suicidal ideas  Objective:  BP 91/60   Pulse 88   Temp 97.4 F (36.3 C) (Oral)   Wt 154 lb 9.6 oz (70.1 kg)   SpO2 96%   BMI 23.51 kg/m   BP/Weight 01/06/2017 11/15/2016 2/67/1245  Systolic BP 91 809 983  Diastolic BP 60 77 80  Wt. (Lbs) 154.6 145 -  BMI 23.51 22.05 -    Wt Readings from Last 3 Encounters:  01/06/17 154 lb 9.6 oz (70.1 kg)  11/15/16 145 lb (65.8 kg)  08/03/16 171 lb 4.8 oz (77.7 kg)      Physical Exam Constitutional: He is oriented to person, place, and time. He appears well-developed and well-nourished.  Eyes: infraorbital edema Neck: JVD present.  Cardiovascular: Normal rate, normal heart sounds and intact distal pulses. 1+ bilateral pitting pedal edema  No murmur heard. Pulmonary/Chest: Effort normal and breath sounds normal. He has no wheezes. He has no rales. He exhibits no tenderness.  Abdominal: Soft. Bowel sounds are normal. He exhibits no distension and no mass. There is no tenderness.  Genitourinary: Edema of the prepuce with some erythema along the shaft of the penis, uncircumcised  Musculoskeletal:  Normal range of motion.  Neurological: He is alert and oriented to person, place, and time.  Skin: Xerosis of skin of hands Psychiatric: He has a normal mood and affect.  Assessment & Plan:   1. CKD (chronic kidney disease) stage 3, GFR 30-59 ml/min Avoid nephrotoxins He is currently on Lasix and will need to keep a close eye on renal function  2. Chronic combined systolic and diastolic congestive heart failure (HCC) NYHA III-IV EF 20-25% from 05/2016 Gained 10 lbs in the last month ; fluid overloaded Yet to take Lasix today but has complained of worsening CHF symptoms despite compliance with his Lasix previously He has an appointment with cardiology in the next hour - will defer further management to them, may need hospitalization for IV Lasix I  have reviewed his medications and he does not seem to be taking Entresto - Brain natriuretic peptide  3. Benign essential HTN Blood pressures on the soft side despite his not taking any medication today  4. Lower urinary tract symptoms Commenced on Flomax  5. Other constipation Place him relax Increase fiber  6. Balanitis Placed on clotrimazole - Ambulatory referral to Urology   Meds ordered this encounter  Medications  . nitroGLYCERIN (NITROSTAT) 0.4 MG SL tablet    Sig: Place 1 tablet (0.4 mg total) under the tongue every 5 (five) minutes x 3 doses as needed for chest pain.    Dispense:  30 tablet    Refill:  1  . aspirin 81 MG EC tablet    Sig: Take 1 tablet (81 mg total) by mouth daily.    Dispense:  30 tablet    Refill:  3  . albuterol (PROAIR HFA) 108 (90 Base) MCG/ACT inhaler    Sig: Inhale 1-2 puffs into the lungs every 6 (six) hours as needed for wheezing or shortness of breath.    Dispense:  8.5 g    Refill:  1  . clotrimazole (LOTRIMIN) 1 % cream    Sig: Apply 1 application topically 2 (two) times daily.    Dispense:  30 g    Refill:  0  . tamsulosin (FLOMAX) 0.4 MG CAPS capsule    Sig: Take 1 capsule (0.4 mg  total) by mouth daily.    Dispense:  30 capsule    Refill:  3  . polyethylene glycol (MIRALAX) packet    Sig: Take 17 g by mouth daily.    Dispense:  14 each    Refill:  0    Follow-up: Return in about 1 month (around 02/05/2017) for Follow-up of chronic medical conditions.   This note has been created with Surveyor, quantity. Any transcriptional errors are unintentional.     [name] MD

## 2017-01-06 NOTE — Patient Instructions (Signed)
Balanitis Balanitis is swelling and irritation (inflammation) of the head of the penis (glans penis). The condition may also cause inflammation of the skin around the glans penis (foreskin) in men who have not been circumcised. It may develop because of an infection or another medical condition. Balanitis occurs most often among men who have not had their foreskin removed (uncircumcised men). Balanitis sometimes causes scarring of the penis or foreskin, which can require surgery. Untreated balanitis can increase the risk of penile cancer. What are the causes? Common causes of this condition include:  Poor personal hygiene, especially in uncircumcised men. Not cleaning the glans penis and foreskin well can result in buildup of bacteria, viruses, and yeast, which can lead to infection and inflammation.  Irritation and lack of air flow due to fluid (smegma) that can build up on the glans penis.  Other causes include:  Chemical irritation from products such as soaps or shower gels (especially those that have fragrance), condoms, personal lubricants, petroleum jelly, spermicides, or fabric softeners.  Skin conditions, such as eczema, dermatitis, and psoriasis.  Allergies to medicines, such as tetracycline and sulfa drugs.  Certain medical conditions, including liver cirrhosis, congestive heart failure, diabetes, and kidney disease.  Infections, such as candidiasis, HPV (human papillomavirus), herpes simplex, gonorrhea, and syphilis.  Severe obesity.  What increases the risk? The following factors may make you more likely to develop this condition:  Having diabetes. This is the most common risk factor.  Having a tight foreskin that is difficult to pull back (retract) past the glans.  Having sexual intercourse without using a condom.  What are the signs or symptoms? Symptoms of this condition include:  Discharge from under the foreskin.  A bad smell.  Pain or difficulty retracting  the foreskin.  Tenderness, redness, and swelling of the glans.  A rash or sores on the glans or foreskin.  Itchiness.  Inability to get an erection due to pain.  Difficulty urinating.  Scarring of the penis or foreskin, in some cases.  How is this diagnosed? This condition may be diagnosed based on:  A physical exam.  Testing a swab of discharge to check for bacterial or fungal infection.  Blood tests: ? To check for viruses that can cause balanitis. ? To check your blood sugar (glucose) level. High blood glucose could be a sign of diabetes, which can cause balanitis.  How is this treated? Treatment for balanitis depends on the cause. Treatment may include:  Improving personal hygiene. Your health care provider may recommend sitting in a bath of warm water that is deep enough to cover your hips and buttocks (sitz bath).  Medicines such as: ? Creams or ointments to reduce swelling (steroids) or to treat an infection. ? Antibiotic medicine. ? Antifungal medicine.  Surgery to remove or cut the foreskin (circumcision). This may be done if you have scarring on the foreskin that makes it difficult to retract.  Controlling other medical problems that may be causing your condition or making it worse.  Follow these instructions at home:  Do not have sex until the condition clears up, or until your health care provider approves.  Keep your penis clean and dry. Take sitz baths as recommended by your health care provider.  Avoid products that irritate your skin or make symptoms worse, such as soaps and shower gels that have fragrance.  Take over-the-counter and prescription medicines only as told by your health care provider. ? If you were prescribed an antibiotic medicine or a cream   or ointment, use it as told by your health care provider. Do not stop using your medicine, cream, or ointment even if you start to feel better. ? Do not drive or use heavy machinery while taking  prescription pain medicine. Contact a health care provider if:  Your symptoms get worse or do not improve with home care.  You develop chills or a fever.  You have trouble urinating.  You cannot retract your foreskin. Get help right away if:  You develop severe pain.  You are unable to urinate. Summary  Balanitis is inflammation of the head of the penis (glans penis) caused by irritation or infection.  Balanitis causes pain, redness, and swelling of the glans penis.  This condition is most common among uncircumcised men who do not keep their glans penis clean and in men who have diabetes.  Treatment may include creams or ointments.  Good hygiene is important for prevention. This includes pulling back the foreskin when washing your penis. This information is not intended to replace advice given to you by your health care provider. Make sure you discuss any questions you have with your health care provider. Document Released: 11/20/2008 Document Revised: 05/23/2016 Document Reviewed: 05/23/2016 Elsevier Interactive Patient Education  2017 Else[employer] Inc.   

## 2017-01-07 LAB — BRAIN NATRIURETIC PEPTIDE: BNP: 4763 pg/mL — AB (ref 0.0–100.0)

## 2017-01-09 ENCOUNTER — Telehealth: Payer: Self-pay | Admitting: Family Medicine

## 2017-01-09 ENCOUNTER — Telehealth: Payer: Self-pay | Admitting: *Deleted

## 2017-01-09 NOTE — Telephone Encounter (Signed)
Attempt to call patient in reference to lab results: BNP elevated. Pt needs to be informed to go too the ED per Dr. Jarold Song. Left message on voicemail.   Line busy on [phone] Number not in service on [phone]

## 2017-01-09 NOTE — Telephone Encounter (Signed)
I spoke with the patient over the phone with results of elevated BNP. He was seen by his cardiologist 3 days ago and his diuretics were adjusted and he reports feeling much better with significant improvement in dyspnea .

## 2017-01-20 ENCOUNTER — Other Ambulatory Visit: Payer: Self-pay | Admitting: Family Medicine

## 2017-02-07 ENCOUNTER — Ambulatory Visit: Payer: Medicaid Other | Admitting: Family Medicine

## 2017-02-20 ENCOUNTER — Other Ambulatory Visit: Payer: Self-pay | Admitting: Family Medicine

## 2017-02-20 DIAGNOSIS — R0981 Nasal congestion: Secondary | ICD-10-CM

## 2017-02-22 ENCOUNTER — Other Ambulatory Visit: Payer: Self-pay | Admitting: Family Medicine

## 2017-02-27 ENCOUNTER — Emergency Department (HOSPITAL_COMMUNITY)
Admission: EM | Admit: 2017-02-27 | Discharge: 2017-02-28 | Disposition: A | Discharge status: Home / Self Care | Payer: Medicaid Other | Attending: Emergency Medicine | Admitting: Emergency Medicine

## 2017-02-27 ENCOUNTER — Emergency Department (HOSPITAL_COMMUNITY): Payer: Medicaid Other

## 2017-02-27 ENCOUNTER — Encounter (HOSPITAL_COMMUNITY): Payer: Self-pay | Admitting: Pharmacy Technician

## 2017-02-27 DIAGNOSIS — Z7902 Long term (current) use of antithrombotics/antiplatelets: Secondary | ICD-10-CM | POA: Insufficient documentation

## 2017-02-27 DIAGNOSIS — F1410 Cocaine abuse, uncomplicated: Secondary | ICD-10-CM | POA: Diagnosis not present

## 2017-02-27 DIAGNOSIS — Z79899 Other long term (current) drug therapy: Secondary | ICD-10-CM | POA: Diagnosis not present

## 2017-02-27 DIAGNOSIS — Z87891 Personal history of nicotine dependence: Secondary | ICD-10-CM | POA: Diagnosis not present

## 2017-02-27 DIAGNOSIS — I130 Hypertensive heart and chronic kidney disease with heart failure and stage 1 through stage 4 chronic kidney disease, or unspecified chronic kidney disease: Secondary | ICD-10-CM | POA: Diagnosis not present

## 2017-02-27 DIAGNOSIS — R079 Chest pain, unspecified: Secondary | ICD-10-CM | POA: Diagnosis not present

## 2017-02-27 DIAGNOSIS — I5042 Chronic combined systolic (congestive) and diastolic (congestive) heart failure: Secondary | ICD-10-CM | POA: Insufficient documentation

## 2017-02-27 DIAGNOSIS — N289 Disorder of kidney and ureter, unspecified: Secondary | ICD-10-CM

## 2017-02-27 DIAGNOSIS — Z7982 Long term (current) use of aspirin: Secondary | ICD-10-CM | POA: Diagnosis not present

## 2017-02-27 DIAGNOSIS — N184 Chronic kidney disease, stage 4 (severe): Secondary | ICD-10-CM | POA: Diagnosis not present

## 2017-02-27 DIAGNOSIS — R609 Edema, unspecified: Secondary | ICD-10-CM | POA: Diagnosis not present

## 2017-02-27 DIAGNOSIS — I5023 Acute on chronic systolic (congestive) heart failure: Secondary | ICD-10-CM

## 2017-02-27 LAB — BASIC METABOLIC PANEL
ANION GAP: 11 (ref 5–15)
BUN: 21 mg/dL — ABNORMAL HIGH (ref 6–20)
CHLORIDE: 110 mmol/L (ref 101–111)
CO2: 20 mmol/L — ABNORMAL LOW (ref 22–32)
CREATININE: 1.62 mg/dL — AB (ref 0.61–1.24)
Calcium: 9.5 mg/dL (ref 8.9–10.3)
GFR calc non Af Amer: 44 mL/min — ABNORMAL LOW (ref >60)
GFR, EST AFRICAN AMERICAN: 51 mL/min — AB (ref >60)
Glucose, Bld: 95 mg/dL (ref 65–99)
Potassium: 4.4 mmol/L (ref 3.5–5.1)
SODIUM: 141 mmol/L (ref 135–145)

## 2017-02-27 LAB — URINALYSIS, ROUTINE W REFLEX MICROSCOPIC
BILIRUBIN URINE: NEGATIVE
GLUCOSE, UA: NEGATIVE mg/dL
HGB URINE DIPSTICK: NEGATIVE
Ketones, ur: NEGATIVE mg/dL
NITRITE: NEGATIVE
PH: 5 (ref 5.0–8.0)
Protein, ur: 100 mg/dL — AB
SPECIFIC GRAVITY, URINE: 1.025 (ref 1.005–1.030)

## 2017-02-27 LAB — CBC WITH DIFFERENTIAL/PLATELET
BASOS PCT: 2 %
Basophils Absolute: 0.1 10*3/uL (ref 0.0–0.1)
Eosinophils Absolute: 0.1 10*3/uL (ref 0.0–0.7)
Eosinophils Relative: 2 %
HEMATOCRIT: 38.9 % — AB (ref 39.0–52.0)
HEMOGLOBIN: 12.9 g/dL — AB (ref 13.0–17.0)
Lymphocytes Relative: 31 %
Lymphs Abs: 1.1 10*3/uL (ref 0.7–4.0)
MCH: 34.5 pg — ABNORMAL HIGH (ref 26.0–34.0)
MCHC: 33.2 g/dL (ref 30.0–36.0)
MCV: 104 fL — ABNORMAL HIGH (ref 78.0–100.0)
Monocytes Absolute: 0.5 10*3/uL (ref 0.1–1.0)
Monocytes Relative: 15 %
NEUTROS ABS: 1.8 10*3/uL (ref 1.7–7.7)
NEUTROS PCT: 50 %
Platelets: 125 10*3/uL — ABNORMAL LOW (ref 150–400)
RBC: 3.74 MIL/uL — AB (ref 4.22–5.81)
RDW: 15.3 % (ref 11.5–15.5)
WBC: 3.6 10*3/uL — AB (ref 4.0–10.5)

## 2017-02-27 LAB — TROPONIN I
TROPONIN I: 0.04 ng/mL — AB (ref <0.03)
TROPONIN I: 0.04 ng/mL — AB (ref <0.03)

## 2017-02-27 LAB — RAPID URINE DRUG SCREEN, HOSP PERFORMED
Amphetamines: NOT DETECTED
BARBITURATES: NOT DETECTED
BENZODIAZEPINES: NOT DETECTED
Cocaine: POSITIVE — AB
Opiates: NOT DETECTED
Tetrahydrocannabinol: NOT DETECTED

## 2017-02-27 LAB — ETHANOL

## 2017-02-27 LAB — DIGOXIN LEVEL: Digoxin Level: 1 ng/mL (ref 0.8–2.0)

## 2017-02-27 MED ORDER — FUROSEMIDE 10 MG/ML IJ SOLN
80.0000 mg | Freq: Once | INTRAMUSCULAR | Status: AC
  Administered 2017-02-27: 80 mg via INTRAVENOUS
  Filled 2017-02-27: qty 8

## 2017-02-27 MED ORDER — FUROSEMIDE 20 MG PO TABS
160.0000 mg | ORAL_TABLET | Freq: Once | ORAL | Status: AC
  Administered 2017-02-28: 160 mg via ORAL
  Filled 2017-02-27: qty 8

## 2017-02-27 MED ORDER — LORAZEPAM 1 MG PO TABS
1.0000 mg | ORAL_TABLET | Freq: Once | ORAL | Status: AC
  Administered 2017-02-28: 1 mg via ORAL
  Filled 2017-02-27: qty 1

## 2017-02-27 MED ORDER — FUROSEMIDE 80 MG PO TABS: 160.0000 mg | ORAL_TABLET | Freq: Two times a day (BID) | ORAL | 0 refills | Status: DC

## 2017-02-27 NOTE — ED Notes (Signed)
Pt taken to XRay 

## 2017-02-27 NOTE — ED Triage Notes (Signed)
Pt presents to the ED via EMS with reports of cough. Reports cough is productive in nature. Pt also complaining of CP worse when coughing. Pt with hx of MI. VSS with EMS. Pt also states swelling to feet and abd. 324mg  aspirin and 1 Nitroglycerin given PTA.

## 2017-02-27 NOTE — ED Provider Notes (Signed)
[facility] DEPT Provider Note   CSN: [id number] Arrival date & time: 02/27/17  1620     History   Chief Complaint Chief Complaint  Patient presents with  . Chest Pain  . Cough    HPI [name] is a 61 y.o. male.  He presents for evaluation of chest tightness present for 2 weeks. It is associated with generalized swelling and weight gain. He has a history of congestive heart failure, with EF 20-25%. He is taking his usual medications. He denies cough, wheezing, dizziness, neck pain or back pain. There are no other known modifying factors.   HPI  Past Medical History:  Diagnosis Date  . Anxiety   . Arthritis    "all over" (01/07/2015)  . CKD (chronic kidney disease), stage IV ([place])   . Depression   . Headache    "probably weekly" (01/07/2015)  . Heart attack ([place]) 07/2014  . History of echocardiogram 12/2014   EF 20-25%  . Homelessness   . Hypercholesterolemia   . Hypertension   . Ischemic dilated cardiomyopathy ([place])   . Noncompliance   . NSTEMI (non-ST elevated myocardial infarction) ([place]) 01/07/2015  . Polysubstance abuse    etoh, cocaine  . Urinary hesitancy   . Walking pneumonia 07/2014    Patient Active Problem List   Diagnosis Date Noted  . Lower urinary tract symptoms 01/06/2017  . Syncope 07/31/2016  . Acute on chronic systolic heart failure ([place]) 07/05/2016  . CHF exacerbation ([place]) 07/05/2016  . Macrocytosis without anemia 07/05/2016  . Hypotension 06/14/2016  . Generalized anxiety disorder 06/13/2016  . Heart failure ([place]) 06/11/2016  . Dehydration 04/29/2016  . Dermatitis 01/27/2016  . Arthralgia 08/25/2015  . Hyperbilirubinemia   . Renal failure (ARF), acute on chronic (HCC)   . Dyspnea   . CKD (chronic kidney disease) stage 3, GFR 30-59 ml/min 05/03/2015  . Chronic combined systolic and diastolic congestive heart failure ([place]) 05/03/2015  . Benign essential HTN 05/03/2015  . History of non-ST elevation myocardial infarction (NSTEMI)  05/03/2015  . Abnormal LFTs 05/03/2015  . Polysubstance abuse   . Noncompliance   . Gout 03/24/2015  . Hypotensive episode 09/03/2014    Past Surgical History:  Procedure Laterality Date  . CARDIAC CATHETERIZATION    . LEFT HEART CATHETERIZATION WITH CORONARY ANGIOGRAM N/A 08/15/2014   Procedure: LEFT HEART CATHETERIZATION WITH CORONARY ANGIOGRAM;  Surgeon: Clent Demark, MD;  Location: [facility] CATH LAB;  Service: Cardiovascular;  Laterality: N/A;       Home Medications    Prior to Admission medications   Medication Sig Start Date End Date Taking? Authorizing Provider  albuterol (PROAIR HFA) 108 (90 Base) MCG/ACT inhaler Inhale 1-2 puffs into the lungs every 6 (six) hours as needed for wheezing or shortness of breath. 01/06/17  Yes [name], MD  allopurinol (ZYLOPRIM) 300 MG tablet Take 0.5 tablets (150 mg total) by mouth daily. 11/15/16  Yes [name], MD  aspirin 81 MG EC tablet Take 1 tablet (81 mg total) by mouth daily. 01/06/17  Yes [name], MD  atorvastatin (LIPITOR) 80 MG tablet Take 80 mg by mouth daily.   Yes [provider]  betamethasone valerate (VALISONE) 0.1 % cream APPLY TOPICALLY 2 TIMES DAILY. 11/15/16  Yes [name], MD  cetirizine (ZYRTEC) 10 MG tablet Take 1 tablet (10 mg total) by mouth daily. 11/15/16  Yes [name], MD  clopidogrel (PLAVIX) 75 MG tablet Take 75 mg by mouth daily. 11/08/16  Yes [provider]  clotrimazole (LOTRIMIN) 1 % cream Apply 1 application topically 2 (two) times daily. 01/06/17  Yes [name], MD  digoxin (LANOXIN) 0.125 MG tablet Take 1 tablet (0.125 mg total) by mouth daily. 08/05/16  Yes Charolette Forward, MD  fluticasone (FLONASE) 50 MCG/ACT nasal spray Place 2 sprays into both nostrils daily. (120/4=30) 02/20/17  Yes [name], MD  furosemide (LASIX) 80 MG tablet Take 80 mg by mouth 2 (two) times daily. 08/22/16  Yes [provider]  ivabradine (CORLANOR) 5 MG TABS tablet Take 5 mg by mouth 2  (two) times daily with a meal.   Yes [provider]  Multiple Vitamin (MULTIVITAMIN WITH MINERALS) TABS tablet Take 1 tablet by mouth daily.   Yes [provider]  nitroGLYCERIN (NITROSTAT) 0.4 MG SL tablet Place 1 tablet (0.4 mg total) under the tongue every 5 (five) minutes x 3 doses as needed for chest pain. 01/06/17  Yes [name], MD  PARoxetine (PAXIL) 20 MG tablet Take 1 tablet (20 mg total) by mouth daily. 06/12/16  Yes Minus Liberty, MD  polyethylene glycol [facility]) packet Take 17 g by mouth daily. 01/06/17  Yes [name], MD  potassium chloride SA (K-DUR,KLOR-CON) 20 MEQ tablet Take 20 mEq by mouth daily. 06/04/16  Yes [provider]  ranitidine (ZANTAC) 150 MG tablet Take 1 tablet (150 mg total) by mouth 2 (two) times daily. 02/22/17  Yes Amao, Charlane Ferretti, MD  sacubitril-valsartan (ENTRESTO) 24-26 MG Take 1 tablet by mouth 2 (two) times daily. 08/04/16  Yes Charolette Forward, MD  tamsulosin (FLOMAX) 0.4 MG CAPS capsule Take 1 capsule (0.4 mg total) by mouth daily. 01/06/17  Yes [name], MD  triamcinolone cream (KENALOG) 0.1 % Apply 1 application topically 2 (two) times daily as needed (itching).  06/28/16  Yes [provider]  b complex vitamins tablet Take 1 tablet by mouth daily.    [provider]    Family History No family history on file.  Social History Social History  Substance Use Topics  . Smoking status: Former Smoker    Packs/day: 1.50    Years: 30.00    Types: Cigarettes    Quit date: 02/19/2004  . Smokeless tobacco: Former Systems developer     Comment: quit 2005  . Alcohol use 1.2 - 2.4 oz/week    2 - 4 Cans of beer per week     Comment: once weekly     Allergies   Tape   Review of Systems Review of Systems  All other systems reviewed and are negative.    Physical Exam Updated Vital Signs BP (!) 137/96 (BP Location: Right Arm)   Pulse 79   Temp 98 F (36.7 C)   Resp (!) 29   Ht 5' 8.5" (1.74 m)   Wt  74.2 kg (163 lb 8 oz)   SpO2 99%   BMI 24.50 kg/m   Physical Exam  Constitutional: He is oriented to person, place, and time. He appears well-developed. No distress.  Appears older than stated age  HENT:  Head: Normocephalic and atraumatic.  Right Ear: External ear normal.  Left Ear: External ear normal.  Eyes: Pupils are equal, round, and reactive to light. Conjunctivae and EOM are normal.  Neck: Normal range of motion and phonation normal. Neck supple.  Cardiovascular: Normal rate, regular rhythm and normal heart sounds.   Pulmonary/Chest: Effort normal and breath sounds normal. No respiratory distress. He has no wheezes. He has no rales. He exhibits no bony tenderness.  Abdominal:  Soft. There is no tenderness.  Musculoskeletal: Normal range of motion. He exhibits edema (3+ bilateral legs, extending to sacral region.).  Neurological: He is alert and oriented to person, place, and time. No cranial nerve deficit or sensory deficit. He exhibits normal muscle tone. Coordination normal.  Skin: Skin is warm, dry and intact. No rash noted. No erythema.  Psychiatric: He has a normal mood and affect. His behavior is normal. Judgment and thought content normal.  Nursing note and vitals reviewed.    ED Treatments / Results  Labs (all labs ordered are listed, but only abnormal results are displayed) Labs Reviewed  BASIC METABOLIC PANEL - Abnormal; Notable for the following:       Result Value   CO2 20 (*)    BUN 21 (*)    Creatinine, Ser 1.62 (*)    GFR calc non Af Amer 44 (*)    GFR calc Af Amer 51 (*)    All other components within normal limits  CBC WITH DIFFERENTIAL/PLATELET - Abnormal; Notable for the following:    WBC 3.6 (*)    RBC 3.74 (*)    Hemoglobin 12.9 (*)    HCT 38.9 (*)    MCV 104.0 (*)    MCH 34.5 (*)    Platelets 125 (*)    All other components within normal limits  TROPONIN I - Abnormal; Notable for the following:    Troponin I 0.04 (*)    All other components  within normal limits  URINALYSIS, ROUTINE W REFLEX MICROSCOPIC - Abnormal; Notable for the following:    Color, Urine AMBER (*)    APPearance HAZY (*)    Protein, ur 100 (*)    Leukocytes, UA SMALL (*)    Bacteria, UA RARE (*)    Squamous Epithelial / LPF 0-5 (*)    All other components within normal limits  RAPID URINE DRUG SCREEN, HOSP PERFORMED - Abnormal; Notable for the following:    Cocaine POSITIVE (*)    All other components within normal limits  TROPONIN I - Abnormal; Notable for the following:    Troponin I 0.04 (*)    All other components within normal limits  ETHANOL  DIGOXIN LEVEL    BUN  Date Value Ref Range Status  02/27/2017 21 (H) 6 - 20 mg/dL Final  08/03/2016 23 (H) 6 - 20 mg/dL Final  08/01/2016 26 (H) 6 - 20 mg/dL Final  07/31/2016 29 (H) 6 - 20 mg/dL Final   Creat  Date Value Ref Range Status  10/14/2015 1.70 (H) 0.70 - 1.25 mg/dL Final  09/08/2015 1.28 (H) 0.70 - 1.25 mg/dL Final  09/02/2015 1.11 0.70 - 1.25 mg/dL Final  08/25/2015 1.22 0.70 - 1.25 mg/dL Final   Creatinine, Ser  Date Value Ref Range Status  02/27/2017 1.62 (H) 0.61 - 1.24 mg/dL Final  08/03/2016 1.51 (H) 0.61 - 1.24 mg/dL Final  08/01/2016 1.57 (H) 0.61 - 1.24 mg/dL Final  07/31/2016 1.88 (H) 0.61 - 1.24 mg/dL Final     EKG  EKG Interpretation  Date/Time:  Monday February 27 2017 16:20:56 EDT Ventricular Rate:  84 PR Interval:  160 QRS Duration: 178 QT Interval:  432 QTC Calculation: 510 R Axis:   -111 Text Interpretation:  Normal sinus rhythm Right bundle branch block Inferior infarct , age undetermined Anteroseptal infarct , age undetermined Abnormal ECG since last tracing no significant change Confirmed by Daleen Bo [phone]) on 02/27/2017 4:28:47 PM       Radiology Dg Chest  2 View  Result Date: 02/27/2017 CLINICAL DATA:  Cough and chest pain.  History of hypertension, CHF. EXAM: CHEST  2 VIEW COMPARISON:  Chest radiograph July 31, 2016 FINDINGS: The cardiac  silhouette is moderately enlarged and unchanged. Calcified aortic knob. Pulmonary vasculature appears normal. No pleural effusion or focal consolidation. No pneumothorax. Soft tissue planes and included osseous structures are nonsuspicious. IMPRESSION: Stable cardiomegaly, no acute pulmonary process. Aortic Atherosclerosis (ICD10-I70.0). Electronically Signed   By: Elon Alas M.D.   On: 02/27/2017 18:10    Procedures Procedures (including critical care time)  Medications Ordered in ED Medications  furosemide (LASIX) tablet 160 mg (not administered)  LORazepam (ATIVAN) tablet 1 mg (not administered)  furosemide (LASIX) injection 80 mg (80 mg Intravenous Given 02/27/17 2107)     Initial Impression / Assessment and Plan / ED Course  I have reviewed the triage vital signs and the nursing notes.  Pertinent labs & imaging results that were available during my care of the patient were reviewed by me and considered in my medical decision making (see chart for details).      Patient Vitals for the past 24 hrs:  BP Temp Pulse Resp SpO2 Height Weight  02/27/17 1937 - 98 F (36.7 C) - - - - -  02/27/17 1915 (!) 137/96 - 79 (!) 29 - - -  02/27/17 1908 - - - - - 5' 8.5" (1.74 m) 74.2 kg (163 lb 8 oz)  02/27/17 1745 (!) 126/98 - 83 (!) 27 99 % - -  02/27/17 1715 (!) 133/102 - 86 (!) 23 100 % - -  02/27/17 1700 (!) 128/94 - 87 (!) 29 100 % - -  02/27/17 1645 (!) 130/101 - 83 (!) 23 100 % - -   22:50- case discussed with his cardiologist who knows him well, and feels like he is stable for discharge with outpatient follow-up. He agrees with current treatment of Lasix 80 mg IV, and ongoing treatment with Lasix 160 mg twice a day. Dr. Terrence Dupont will see the patient in the office tomorrow for follow-up care and treatment as needed.  11:11 PM Reevaluation with update and discussion. After initial assessment and treatment, an updated evaluation reveals he remains comfortable, he requested something  for his "nerves". He has diuresed after taking Lasix. Findings discussed with the patient. He states his pharmacy will be delivering his medication tomorrow. He has been out of some of it. All questions answered. Jiles Goya L   CRITICAL CARE Performed by: Daleen Bo L Total critical care time: 35 minutes Critical care time was exclusive of separately billable procedures and treating other patients. Critical care was necessary to treat or prevent imminent or life-threatening deterioration. Critical care was time spent personally by me on the following activities: development of treatment plan with patient and/or surrogate as well as nursing, discussions with consultants, evaluation of patient's response to treatment, examination of patient, obtaining history from patient or surrogate, ordering and performing treatments and interventions, ordering and review of laboratory studies, ordering and review of radiographic studies, pulse oximetry and re-evaluation of patient's condition.   Final Clinical Impressions(s) / ED Diagnoses   Final diagnoses:  Acute on chronic systolic congestive heart failure (HCC)  Cocaine abuse  Renal insufficiency  Peripheral edema   Nonspecific chest pain, with fluid overload, and history of congestive heart failure. Initial troponin is slightly elevated at 0.04. Repeat unchanged. Doubt ACS, PE or pneumonia. Ongoing intermittent cocaine use, and medication noncompliance contributing to peripheral edema. Doubt acute congestive  heart failure. Patient with chronic nonischemic cardiomyopathy. Mild renal insufficiency, is at baseline.  Nursing Notes Reviewed/ Care Coordinated Applicable Imaging Reviewed Interpretation of Laboratory Data incorporated into ED treatment  The patient appears reasonably screened and/or stabilized for discharge and I doubt any other medical condition or other [facility] requiring further screening, evaluation, or treatment in the ED at this time  prior to discharge.  Plan: Home Medications- increase Lasix to 160 twice a day, continue other medications as soon as possible; Home Treatments- rest, avoid cocaine; return here if the recommended treatment, does not improve the symptoms; Recommended follow up- cardiology follow-up in the office tomorrow morning. Plan for patient to go to the office, for an evaluation.   New Prescriptions New Prescriptions   No medications on file     Daleen Bo, MD 02/27/17 2330

## 2017-02-27 NOTE — ED Notes (Signed)
Pt returned from X-ray.  

## 2017-02-27 NOTE — ED Notes (Signed)
ED Provider at bedside. 

## 2017-02-27 NOTE — Discharge Instructions (Signed)
We are increasing your Lasix dose to 160 mg twice a day. Take the first dose, tomorrow morning at 7 AM.  Make sure that you're taking your potassium as directed. It also helps to use some foods which are high in potassium such as bananas, and greens, every day.  Do not use any cocaine.  Go to your cardiologist office, tomorrow morning at 8:30 for a checkup.

## 2017-02-27 NOTE — ED Notes (Signed)
Pt RR noted to be 35. Pt anxious stating his breathing is "giving him trouble." O2 saturations at 100% RA. Therapeutic communication used to calm pt.

## 2017-02-27 NOTE — ED Notes (Signed)
Pt remains tachypnic in the 30's. Pt oxygen saturations 100% RA. Pt placed on 2L [place] for comfort. Will cont to monitor.

## 2017-02-27 NOTE — ED Notes (Signed)
0.04 troponin

## 2017-02-28 LAB — BRAIN NATRIURETIC PEPTIDE

## 2017-03-03 ENCOUNTER — Encounter: Payer: Self-pay | Admitting: Family Medicine

## 2017-03-03 ENCOUNTER — Ambulatory Visit: Payer: Medicaid Other | Attending: Family Medicine | Admitting: Family Medicine

## 2017-03-03 VITALS — BP 92/61 | HR 86 | Temp 97.9°F | Ht 68.0 in | Wt 154.4 lb

## 2017-03-03 DIAGNOSIS — E7800 Pure hypercholesterolemia, unspecified: Secondary | ICD-10-CM | POA: Diagnosis not present

## 2017-03-03 DIAGNOSIS — I5023 Acute on chronic systolic (congestive) heart failure: Secondary | ICD-10-CM

## 2017-03-03 DIAGNOSIS — F419 Anxiety disorder, unspecified: Secondary | ICD-10-CM | POA: Insufficient documentation

## 2017-03-03 DIAGNOSIS — I5022 Chronic systolic (congestive) heart failure: Secondary | ICD-10-CM | POA: Diagnosis present

## 2017-03-03 DIAGNOSIS — I130 Hypertensive heart and chronic kidney disease with heart failure and stage 1 through stage 4 chronic kidney disease, or unspecified chronic kidney disease: Secondary | ICD-10-CM | POA: Diagnosis not present

## 2017-03-03 DIAGNOSIS — G4700 Insomnia, unspecified: Secondary | ICD-10-CM | POA: Diagnosis not present

## 2017-03-03 DIAGNOSIS — M109 Gout, unspecified: Secondary | ICD-10-CM | POA: Insufficient documentation

## 2017-03-03 DIAGNOSIS — F329 Major depressive disorder, single episode, unspecified: Secondary | ICD-10-CM | POA: Insufficient documentation

## 2017-03-03 DIAGNOSIS — G629 Polyneuropathy, unspecified: Secondary | ICD-10-CM | POA: Diagnosis not present

## 2017-03-03 DIAGNOSIS — I5042 Chronic combined systolic (congestive) and diastolic (congestive) heart failure: Secondary | ICD-10-CM | POA: Insufficient documentation

## 2017-03-03 DIAGNOSIS — N183 Chronic kidney disease, stage 3 unspecified: Secondary | ICD-10-CM

## 2017-03-03 DIAGNOSIS — G4709 Other insomnia: Secondary | ICD-10-CM | POA: Diagnosis not present

## 2017-03-03 DIAGNOSIS — I252 Old myocardial infarction: Secondary | ICD-10-CM | POA: Insufficient documentation

## 2017-03-03 DIAGNOSIS — I10 Essential (primary) hypertension: Secondary | ICD-10-CM

## 2017-03-03 DIAGNOSIS — M1990 Unspecified osteoarthritis, unspecified site: Secondary | ICD-10-CM | POA: Diagnosis not present

## 2017-03-03 MED ORDER — GABAPENTIN 300 MG PO CAPS: 300.0000 mg | ORAL_CAPSULE | Freq: Two times a day (BID) | ORAL | 3 refills | Status: DC

## 2017-03-03 NOTE — Patient Instructions (Signed)
Neuropathic Pain Neuropathic pain is pain caused by damage to the nerves that are responsible for certain sensations in your body (sensory nerves). The pain can be caused by damage to:  The sensory nerves that send signals to your spinal cord and brain (peripheral nervous system).  The sensory nerves in your brain or spinal cord (central nervous system).  Neuropathic pain can make you more sensitive to pain. What would be a minor sensation for most people may feel very painful if you have neuropathic pain. This is usually a long-term condition that can be difficult to treat. The type of pain can differ from person to person. It may start suddenly (acute), or it may develop slowly and last for a long time (chronic). Neuropathic pain may come and go as damaged nerves heal or may stay at the same level for years. It often causes emotional distress, loss of sleep, and a lower quality of life. What are the causes? The most common cause of damage to a sensory nerve is diabetes. Many other diseases and conditions can also cause neuropathic pain. Causes of neuropathic pain can be classified as:  Toxic. Many drugs and chemicals can cause toxic damage. The most common cause of toxic neuropathic pain is damage from drug treatment for cancer (chemotherapy).  Metabolic. This type of pain can happen when a disease causes imbalances that damage nerves. Diabetes is the most common of these diseases. Vitamin B deficiency caused by long-term alcohol abuse is another common cause.  Traumatic. Any injury that cuts, crushes, or stretches a nerve can cause damage and pain. A common example is feeling pain after losing an arm or leg (phantom limb pain).  Compression-related. If a sensory nerve gets trapped or compressed for a long period of time, the blood supply to the nerve can be cut off.  Vascular. Many blood vessel diseases can cause neuropathic pain by decreasing blood supply and oxygen to nerves.  Autoimmune.  This type of pain results from diseases in which the body's defense system mistakenly attacks sensory nerves. Examples of autoimmune diseases that can cause neuropathic pain include lupus and multiple sclerosis.  Infectious. Many types of viral infections can damage sensory nerves and cause pain. Shingles infection is a common cause of this type of pain.  Inherited. Neuropathic pain can be a symptom of many diseases that are passed down through families (genetic).  What are the signs or symptoms? The main symptom is pain. Neuropathic pain is often described as:  Burning.  Shock-like.  Stinging.  Hot or cold.  Itching.  How is this diagnosed? No single test can diagnose neuropathic pain. Your health care provider will do a physical exam and ask you about your pain. You may use a pain scale to describe how bad your pain is. You may also have tests to see if you have a high sensitivity to pain and to help find the cause and location of any sensory nerve damage. These tests may include:  Imaging studies, such as: ? X-rays. ? CT scan. ? MRI.  Nerve conduction studies to test how well nerve signals travel through your sensory nerves (electrodiagnostic testing).  Stimulating your sensory nerves through electrodes on your skin and measuring the response in your spinal cord and brain (somatosensory evoked potentials).  How is this treated? Treatment for neuropathic pain may change over time. You may need to try different treatment options or a combination of treatments. Some options include:  Over-the-counter pain relievers.  Prescription medicines. Some medicines   used to treat other conditions may also help neuropathic pain. These include medicines to: ? Control seizures (anticonvulsants). ? Relieve depression (antidepressants).  Prescription-strength pain relievers (narcotics). These are usually used when other pain relievers do not help.  Transcutaneous nerve stimulation (TENS).  This uses electrical currents to block painful nerve signals. The treatment is painless.  Topical and local anesthetics. These are medicines that numb the nerves. They can be injected as a nerve block or applied to the skin.  Alternative treatments, such as: ? Acupuncture. ? Meditation. ? Massage. ? Physical therapy. ? Pain management programs. ? Counseling.  Follow these instructions at home:  Learn as much as you can about your condition.  Take medicines only as directed by your health care provider.  Work closely with all your health care providers to find what works best for you.  Have a good support system at home.  Consider joining a chronic pain support group. Contact a health care provider if:  Your pain treatments are not helping.  You are having side effects from your medicines.  You are struggling with fatigue, mood changes, depression, or anxiety. This information is not intended to replace advice given to you by your health care provider. Make sure you discuss any questions you have with your health care provider. Document Released: 03/31/2004 Document Revised: 01/22/2016 Document Reviewed: 12/12/2013 Elsevier Interactive Patient Education  2018 [employer]  

## 2017-03-03 NOTE — Progress Notes (Signed)
Subjective:    Patient ID: [name], male    DOB: 30-May-1956, 61 y.o.   MRN: [id number]  HPI He is a 61 year old male with a history of CHF (EF 20-25% from 2-D echo of 05/2016), chronic kidney disease stage III, Gout, dermatitis who presents today for a follow-up visit.  He had a recent ED visit for acute on chronic systolic heart failure which was thought to be triggered by substance abuse. BNP was greater than 4500, troponin was negative. He was treated with IV Lasix and discharged after his condition improved.  He had a visit with his cardiologist-Dr. Terrence Dupont 2 days ago and endorses compliance with all his medications. He is chronically short of breath and Lasix has been increased to 160 mg twice daily.  Today he complains of pins and needles in both feet and associated numbness. He also complains of insomnia. He wakes up after 1-2 hours of sleeping and is unable to go back to sleep.   Past Medical History:  Diagnosis Date  . Anxiety   . Arthritis    "all over" (01/07/2015)  . CKD (chronic kidney disease), stage IV ([place])   . Depression   . Headache    "probably weekly" (01/07/2015)  . Heart attack ([place]) 07/2014  . History of echocardiogram 12/2014   EF 20-25%  . Homelessness   . Hypercholesterolemia   . Hypertension   . Ischemic dilated cardiomyopathy ([place])   . Noncompliance   . NSTEMI (non-ST elevated myocardial infarction) ([place]) 01/07/2015  . Polysubstance abuse    etoh, cocaine  . Urinary hesitancy   . Walking pneumonia 07/2014    Past Surgical History:  Procedure Laterality Date  . CARDIAC CATHETERIZATION    . LEFT HEART CATHETERIZATION WITH CORONARY ANGIOGRAM N/A 08/15/2014   Procedure: LEFT HEART CATHETERIZATION WITH CORONARY ANGIOGRAM;  Surgeon: Clent Demark, MD;  Location: [facility] CATH LAB;  Service: Cardiovascular;  Laterality: N/A;    Allergies  Allergen Reactions  . Tape Rash     Review of Systems Constitutional:  Negative for activity change and  appetite change.  HENT: Negative for sinus pressure and sore throat.   Eyes: Negative for visual disturbance.  Respiratory: positive for chronic shortness of breath   Cardiovascular: Negative for chest pain and positive for leg swelling.  Gastrointestinal: negative for abdominal pain, reflux, diarrhea Endocrine: Negative.   Genitourinary: Negative Musculoskeletal: Negative for joint swelling and myalgias.  Skin: Negative for rash.  Allergic/Immunologic: Negative.   Neurological: Negative for weakness, light-headedness and positive for numbness.  Psychiatric/Behavioral: Negative for dysphoric mood and suicidal ideas    Objective: Vitals:   03/03/17 1419  BP: 92/61  Pulse: 86  Temp: 97.9 F (36.6 C)  TempSrc: Oral  SpO2: 99%  Weight: 154 lb 6.4 oz (70 kg)  Height: 5\' 8"  (1.727 m)      Physical Exam  Constitutional: He is oriented to person, place, and time. He appears well-developed and well-nourished.  Eyes: infraorbital edema Neck: JVD present.  Cardiovascular: Normal rate, normal heart sounds,Unable to palpate dorsalis pedis pulses bilaterally. 2+ bilateral pitting pedal edema  No murmur heard. Pulmonary/Chest: Effort normal and breath sounds normal. He has no wheezes. He has no rales. He exhibits no tenderness.  Abdominal: Soft. Bowel sounds are normal. He exhibits no distension and no mass. There is no tenderness.  Musculoskeletal: Normal range of motion.  Neurological: He is alert and oriented to person, place, and time.  Skin: Xerosis of skin of hands Psychiatric:  He has a normal mood and affect.      Assessment & Plan:  1. CKD (chronic kidney disease) stage 3, GFR 30-59 ml/min Avoid nephrotoxins He is currently on Lasix and we will need to keep a close eye on renal function  2. Chronic combined systolic and diastolic congestive heart failure (HCC) NYHA III-IV EF 20-25% from 05/2016 Weight is stable Advised on low-sodium diet, restrict daily fluid intake to 2  L per day Follow-up with cardiology  3. Benign essential HTN Blood pressure is on the soft side   4. Neuropathy Placed on Gabapentin  5. Insomnia Will hold off on placing on additional medication given sedating side effect of gabapentin Sleep hygiene  This note has been created with Surveyor, quantity. Any transcriptional errors are unintentional.

## 2017-03-04 LAB — BASIC METABOLIC PANEL
BUN / CREAT RATIO: 11 (ref 10–24)
BUN: 18 mg/dL (ref 8–27)
CHLORIDE: 104 mmol/L (ref 96–106)
CO2: 23 mmol/L (ref 20–29)
CREATININE: 1.61 mg/dL — AB (ref 0.76–1.27)
Calcium: 9.5 mg/dL (ref 8.6–10.2)
GFR calc Af Amer: 52 mL/min/{1.73_m2} — ABNORMAL LOW (ref >59)
GFR calc non Af Amer: 45 mL/min/{1.73_m2} — ABNORMAL LOW (ref >59)
GLUCOSE: 89 mg/dL (ref 65–99)
Potassium: 4.1 mmol/L (ref 3.5–5.2)
Sodium: 143 mmol/L (ref 134–144)

## 2017-03-06 ENCOUNTER — Telehealth: Payer: Self-pay

## 2017-03-06 NOTE — Telephone Encounter (Signed)
Pt was called and informed of lab results. 

## 2017-03-07 ENCOUNTER — Other Ambulatory Visit: Payer: Self-pay | Admitting: Family Medicine

## 2017-03-28 ENCOUNTER — Other Ambulatory Visit: Payer: Self-pay | Admitting: Family Medicine

## 2017-03-28 DIAGNOSIS — J3089 Other allergic rhinitis: Secondary | ICD-10-CM

## 2017-04-24 IMAGING — CR DG CHEST 2V
2 series · 2 of 2 positions shown · non-contrast
Comparison: 03/22/2015

CLINICAL DATA: Cough, sore throat for 2 days

EXAM:
CHEST  2 VIEW

[w chest pa]
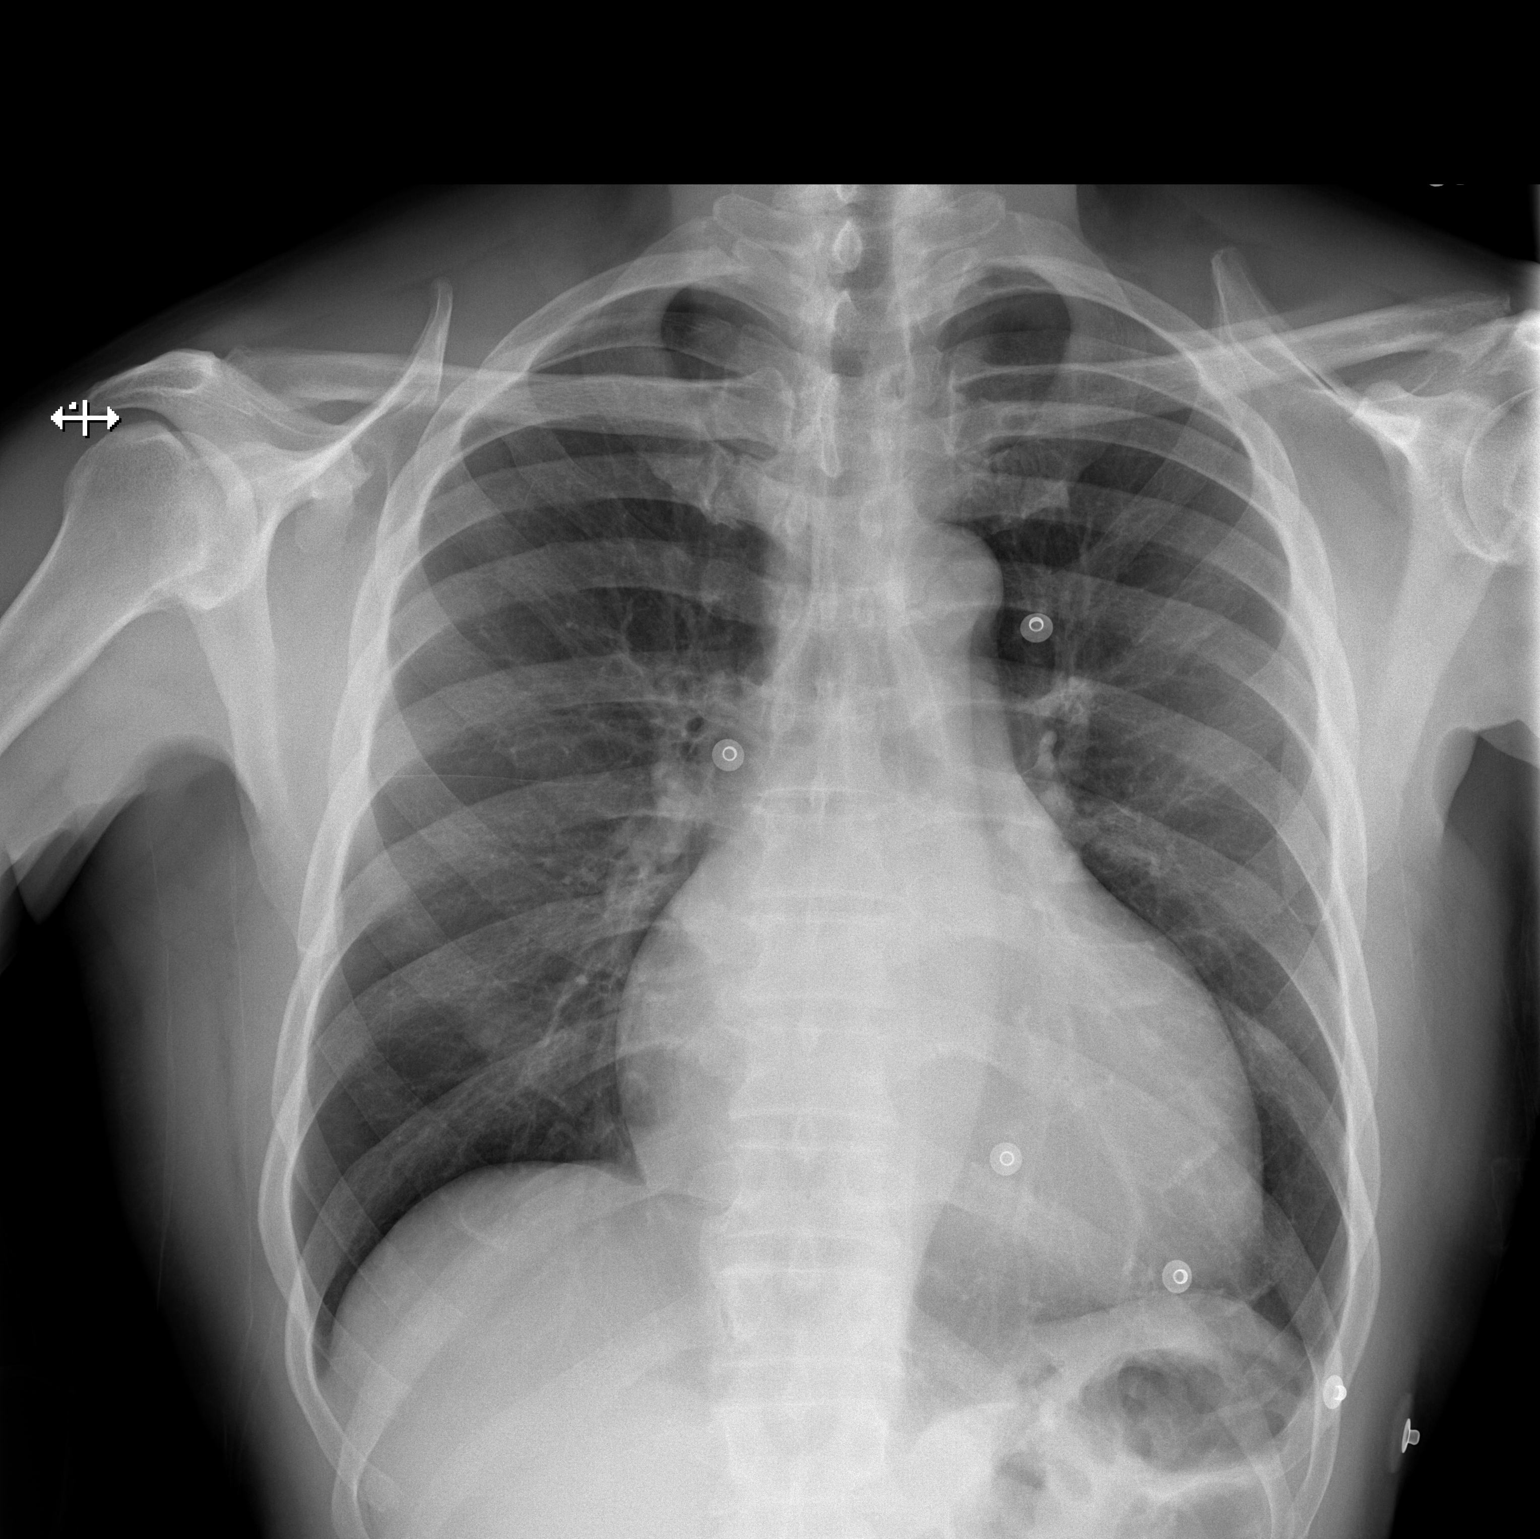

[w chest lat]
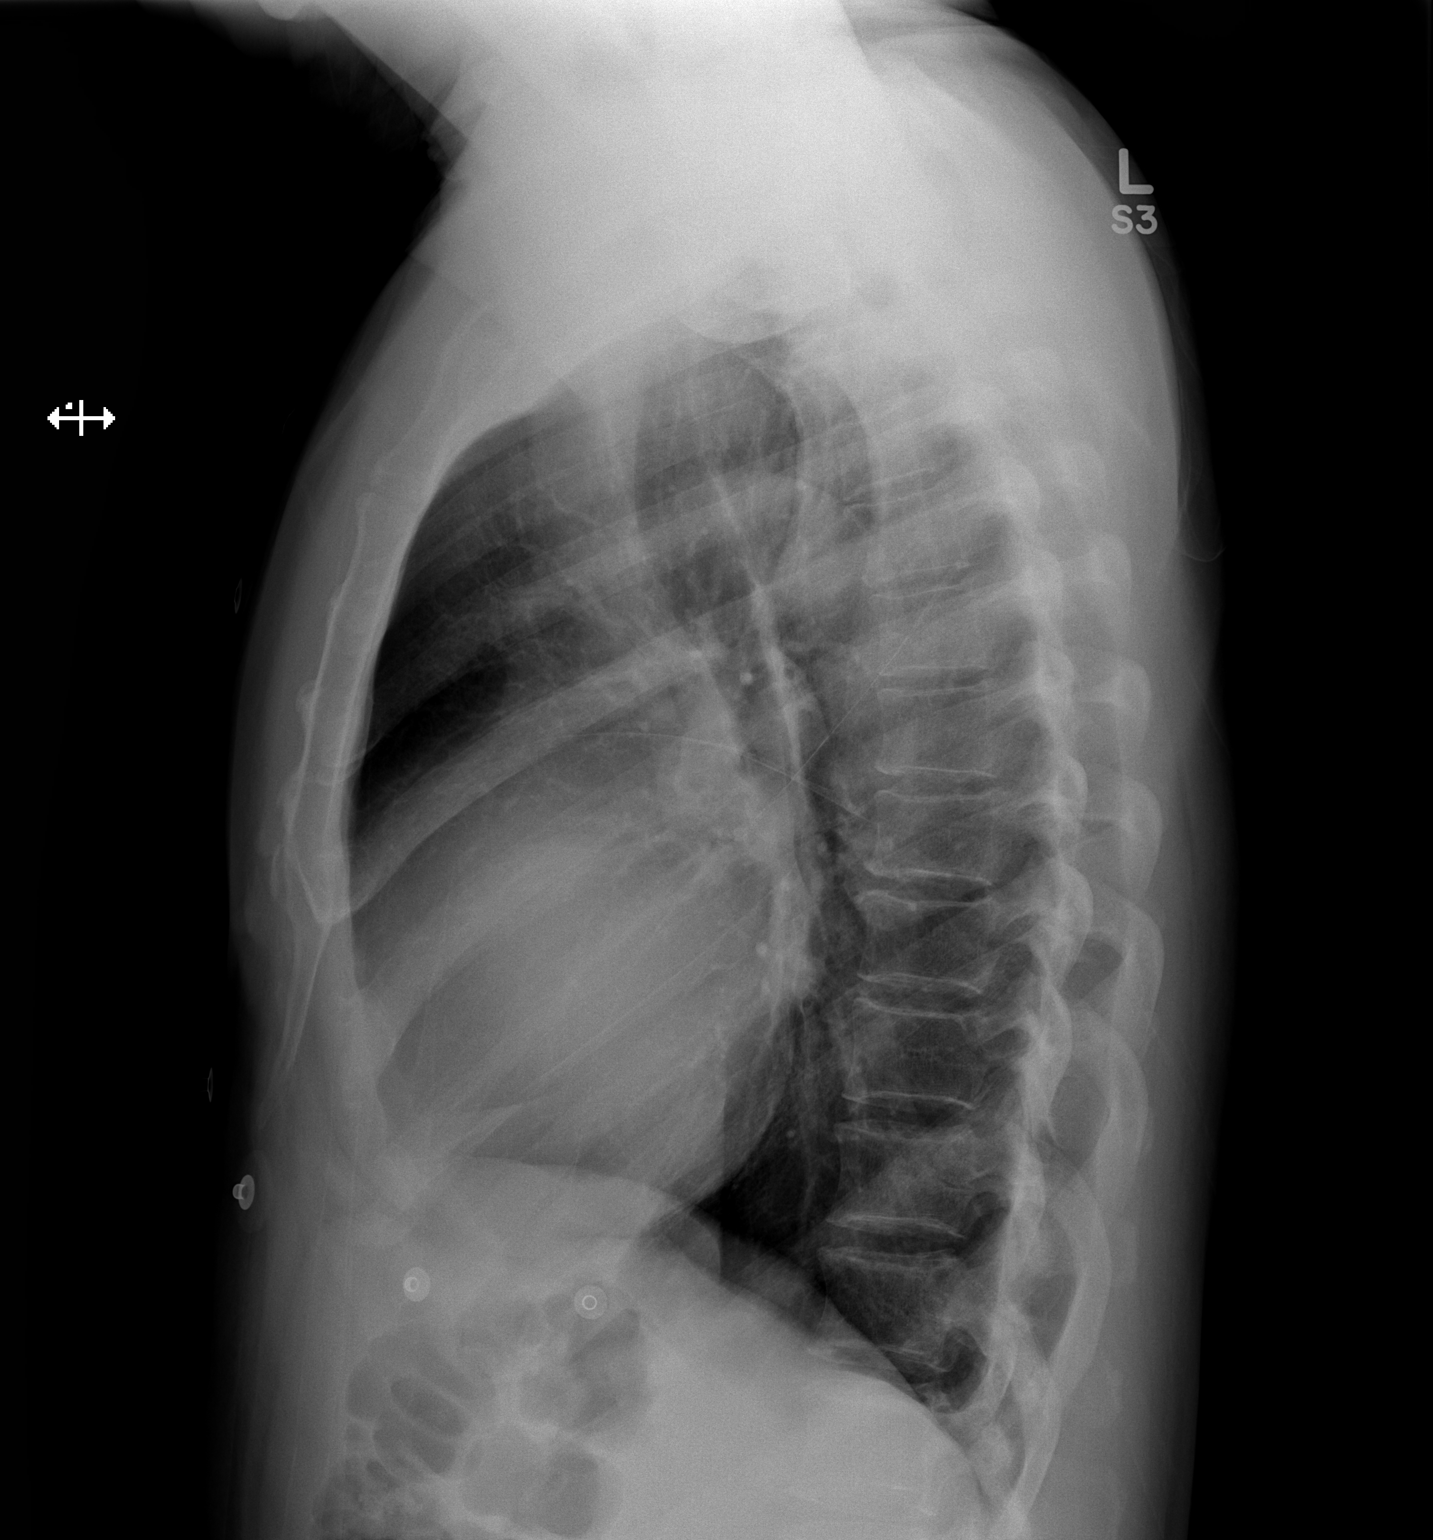

[2 of 2 positions shown; findings below may reference images not displayed]

FINDINGS: There is no focal parenchymal opacity. There is no pleural effusion
or pneumothorax. There is stable cardiomegaly.

The osseous structures are unremarkable.
IMPRESSION: No active cardiopulmonary disease.

## 2017-04-27 IMAGING — CT CT RENAL STONE PROTOCOL
2 of 3 series · 16 of 44 positions shown, 18 images · non-contrast
Comparison: 04/23/2015

CLINICAL DATA: Mid abdominal pain. Elevated liver function tests.
Renal failure.

EXAM:
CT ABDOMEN AND PELVIS WITHOUT CONTRAST
TECHNIQUE: Multidetector CT imaging of the abdomen and pelvis was performed
following the standard protocol without IV contrast.

[Series 4: coronal · coronal · 0.74mm/px · 3 of 99 slices shown]
[im 33/99  soft-tissue]
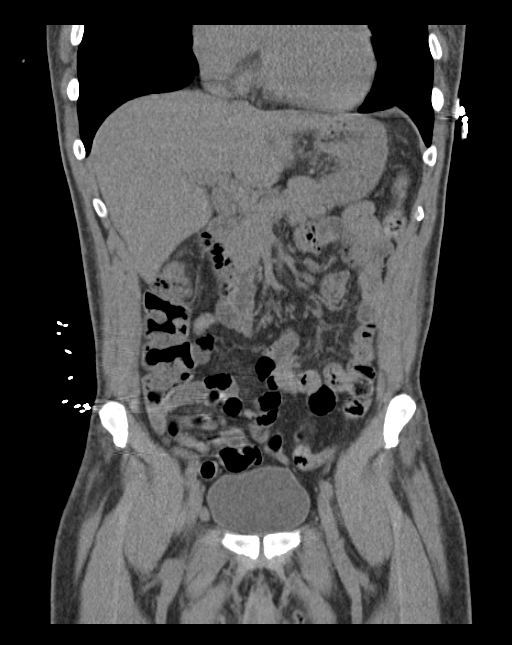
[im 44/99  soft-tissue]
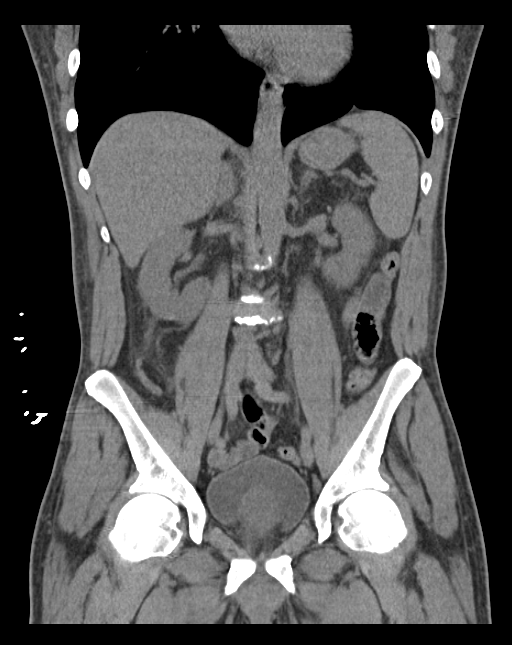
[im 55/99  soft-tissue]
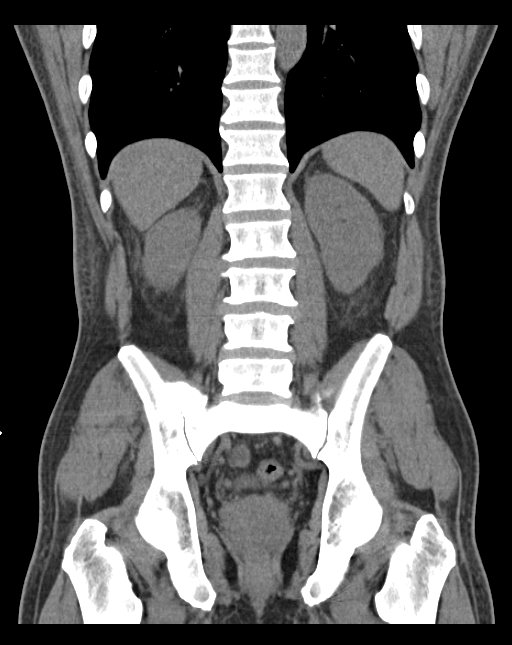

[Series 7: lung · axial · 0.74mm/px · z∈[-540,-415]mm · 13 of 30 slices shown, 15 images]
[im 3/30  soft-tissue]
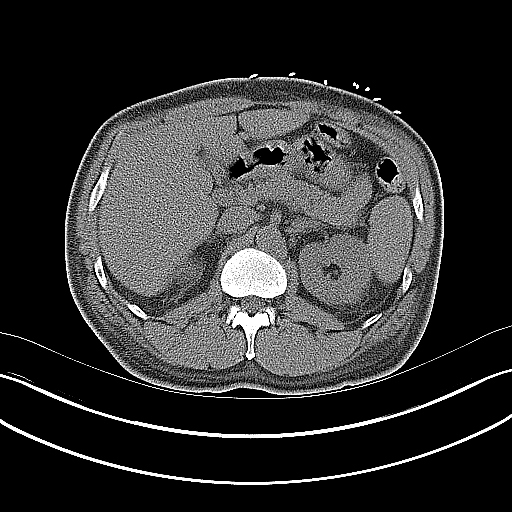
[im 3/30  bone]
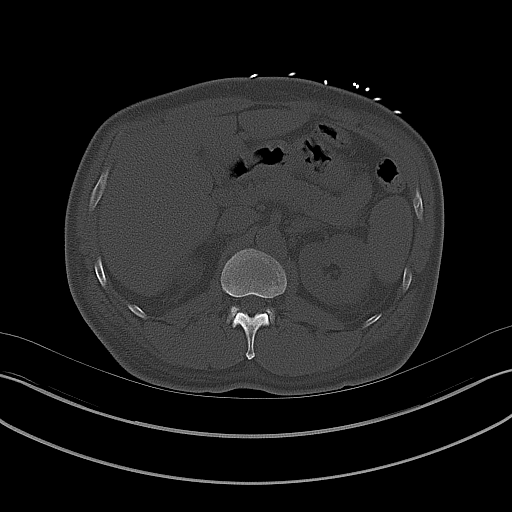
[im 5/30  soft-tissue]
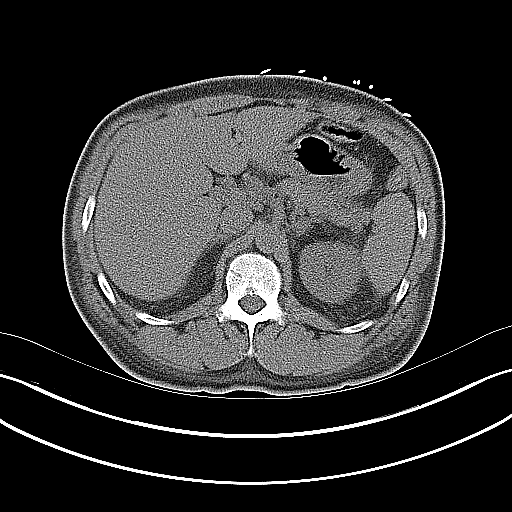
[im 7/30  soft-tissue]
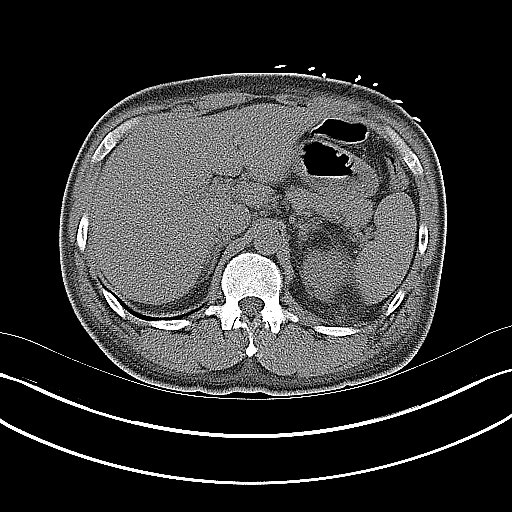
[im 9/30  soft-tissue]
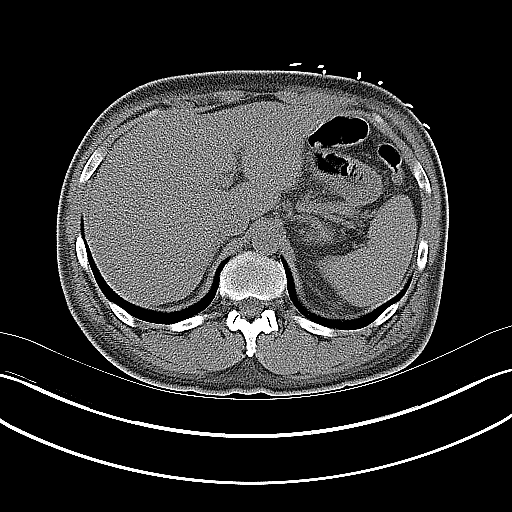
[im 11/30  soft-tissue]
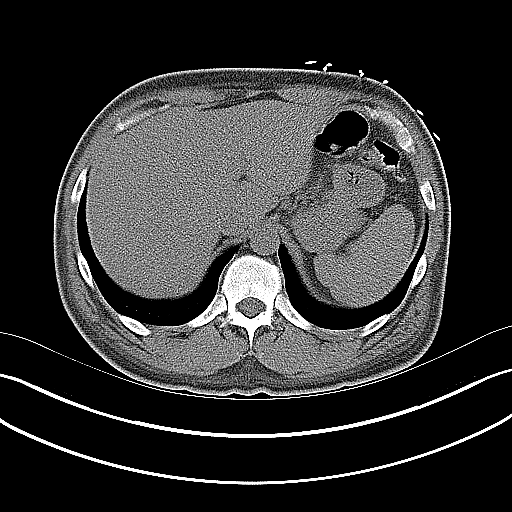
[im 13/30  soft-tissue]
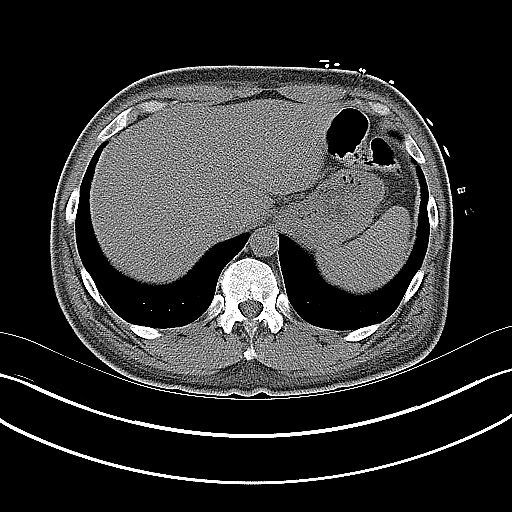
[im 16/30  soft-tissue]
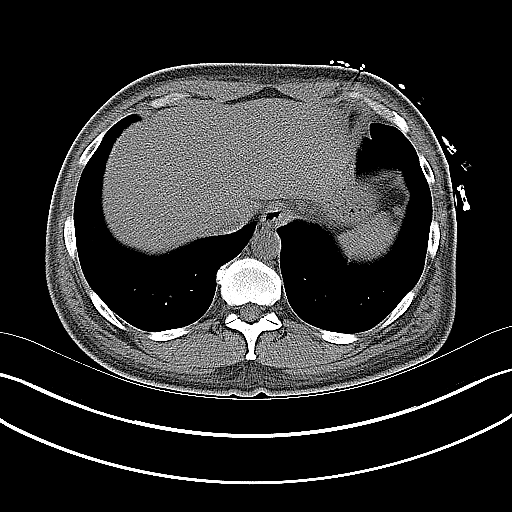
[im 18/30  soft-tissue]
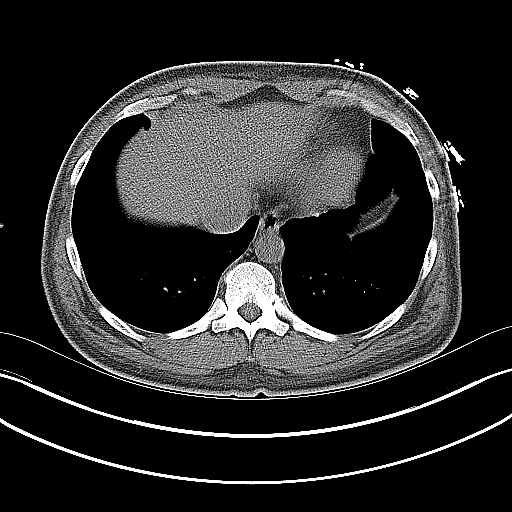
[im 20/30  soft-tissue]
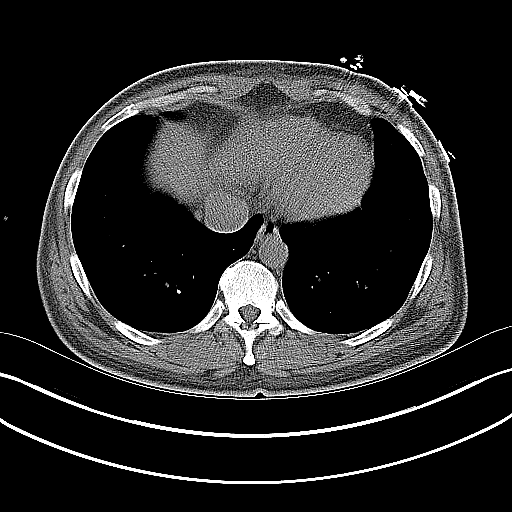
[im 20/30  bone]
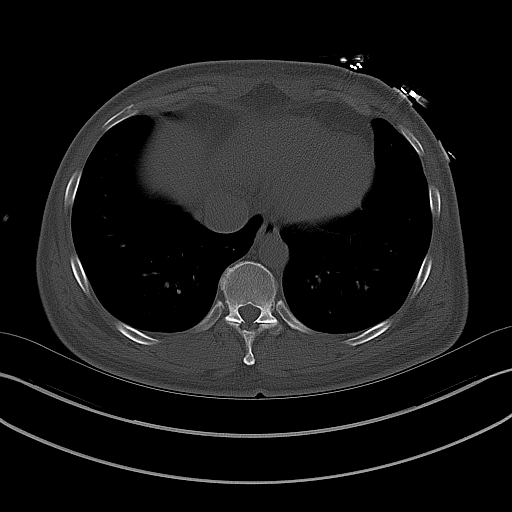
[im 22/30  soft-tissue]
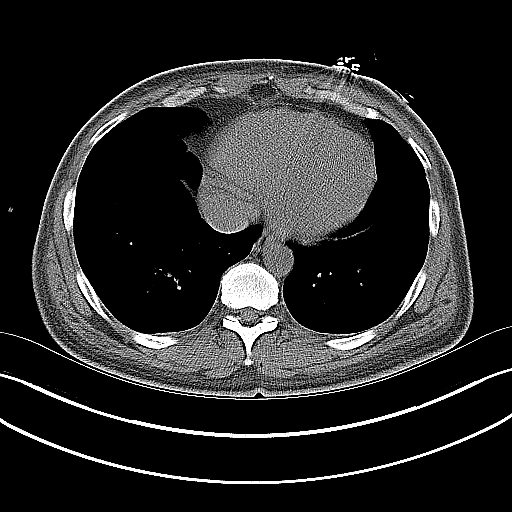
[im 24/30  soft-tissue]
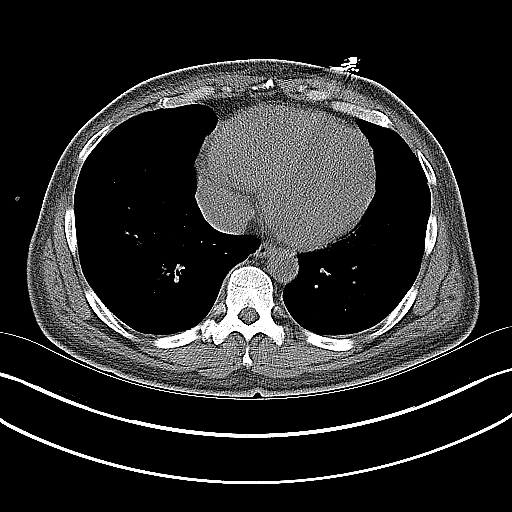
[im 26/30  soft-tissue]
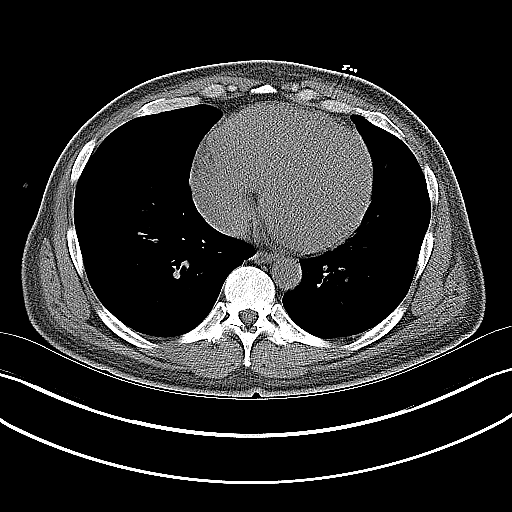
[im 28/30  soft-tissue]
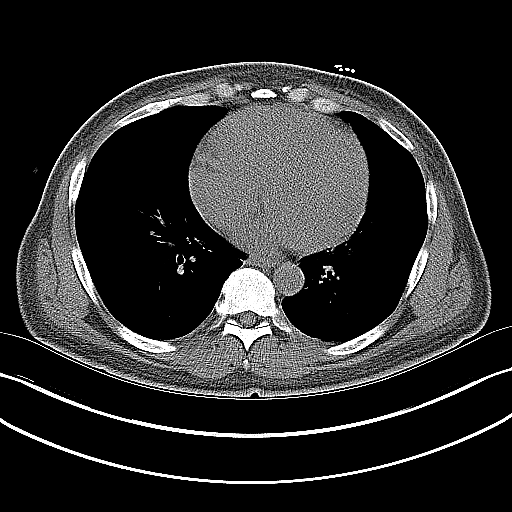

[16 of 44 positions shown; findings below may reference images not displayed]

FINDINGS: Lower chest:  No acute findings.

Hepatobiliary: No mass visualized on this un-enhanced exam.
Gallbladder is unremarkable. No evidence of biliary ductal
dilatation on this unenhanced exam.

Pancreas: No mass or inflammatory process identified on this
un-enhanced exam.

Spleen: Within normal limits in size.

Adrenals/Urinary Tract: No evidence of urolithiasis or
hydronephrosis. Mild bilateral perinephric stranding is nonspecific
and stable in appearance since previous study.

Stomach/Bowel: No evidence of obstruction, inflammatory process, or
abnormal fluid collections. Normal appendix visualized.

Vascular/Lymphatic: No pathologically enlarged lymph nodes. No
evidence of abdominal aortic aneurysm.

Reproductive: Stable moderately enlarged prostate

Other: None.

Musculoskeletal:  No suspicious bone lesions identified.
IMPRESSION: No evidence of urolithiasis, hydronephrosis, or other acute
findings.

Stable moderately enlarged prostate.

## 2017-05-05 ENCOUNTER — Other Ambulatory Visit: Payer: Self-pay | Admitting: Family Medicine

## 2017-05-16 ENCOUNTER — Other Ambulatory Visit: Payer: Self-pay | Admitting: Family Medicine

## 2017-05-16 DIAGNOSIS — J3089 Other allergic rhinitis: Secondary | ICD-10-CM

## 2017-05-24 ENCOUNTER — Other Ambulatory Visit: Payer: Self-pay | Admitting: Family Medicine

## 2017-05-24 DIAGNOSIS — G629 Polyneuropathy, unspecified: Secondary | ICD-10-CM

## 2017-06-05 ENCOUNTER — Ambulatory Visit: Payer: Medicaid Other | Admitting: Family Medicine

## 2017-06-19 ENCOUNTER — Other Ambulatory Visit: Payer: Self-pay | Admitting: Family Medicine

## 2017-06-19 DIAGNOSIS — G629 Polyneuropathy, unspecified: Secondary | ICD-10-CM

## 2017-07-02 ENCOUNTER — Emergency Department (HOSPITAL_COMMUNITY)
Admission: EM | Admit: 2017-07-02 | Discharge: 2017-07-02 | Disposition: A | Discharge status: Home / Self Care | Payer: Medicaid Other | Attending: Emergency Medicine | Admitting: Emergency Medicine

## 2017-07-02 ENCOUNTER — Encounter (HOSPITAL_COMMUNITY): Payer: Self-pay | Admitting: Emergency Medicine

## 2017-07-02 ENCOUNTER — Other Ambulatory Visit: Payer: Self-pay

## 2017-07-02 DIAGNOSIS — I130 Hypertensive heart and chronic kidney disease with heart failure and stage 1 through stage 4 chronic kidney disease, or unspecified chronic kidney disease: Secondary | ICD-10-CM | POA: Insufficient documentation

## 2017-07-02 DIAGNOSIS — J069 Acute upper respiratory infection, unspecified: Secondary | ICD-10-CM

## 2017-07-02 DIAGNOSIS — I252 Old myocardial infarction: Secondary | ICD-10-CM | POA: Diagnosis not present

## 2017-07-02 DIAGNOSIS — R21 Rash and other nonspecific skin eruption: Secondary | ICD-10-CM | POA: Diagnosis present

## 2017-07-02 DIAGNOSIS — Z87891 Personal history of nicotine dependence: Secondary | ICD-10-CM | POA: Insufficient documentation

## 2017-07-02 DIAGNOSIS — N184 Chronic kidney disease, stage 4 (severe): Secondary | ICD-10-CM | POA: Diagnosis not present

## 2017-07-02 DIAGNOSIS — L259 Unspecified contact dermatitis, unspecified cause: Secondary | ICD-10-CM | POA: Insufficient documentation

## 2017-07-02 DIAGNOSIS — Z7902 Long term (current) use of antithrombotics/antiplatelets: Secondary | ICD-10-CM | POA: Insufficient documentation

## 2017-07-02 DIAGNOSIS — Z79899 Other long term (current) drug therapy: Secondary | ICD-10-CM | POA: Insufficient documentation

## 2017-07-02 DIAGNOSIS — Z7982 Long term (current) use of aspirin: Secondary | ICD-10-CM | POA: Insufficient documentation

## 2017-07-02 DIAGNOSIS — I5023 Acute on chronic systolic (congestive) heart failure: Secondary | ICD-10-CM | POA: Insufficient documentation

## 2017-07-02 MED ORDER — PREDNISONE 10 MG (21) PO TBPK: ORAL_TABLET | Freq: Every day | ORAL | 0 refills | Status: DC

## 2017-07-02 NOTE — ED Triage Notes (Signed)
Pt states 1 month of generalized rash, states it has not gone away. Pt has homelesness but states he has been compliant with all of his medications. Pt states benadryl is not helping the rash. Denies chest pain or shortness of breath. Pt states he has not been sleeping because of the itching and feels tired.

## 2017-07-02 NOTE — ED Provider Notes (Signed)
[facility] EMERGENCY DEPARTMENT Provider Note   CSN: [id number] Arrival date & time: 07/02/17  1354     History   Chief Complaint Chief Complaint  Patient presents with  . Rash  . Nasal Congestion  . Cough    HPI [name] is a 61 y.o. male.  HPI 6 61y.o. Male presents complaining of rash for severalmonths wihci began on rhe left arm and right leg.  It has spread and is itchy.  He is taking Benadryl without relief.  He is scheduled to follow-up with his primary care physician for recheck tomorrow.  He states that the itching was severe and he could not wait until tomorrow. He also complains of some nasal congestion and cough that began several days ago.  He is not dyspneic.  The cough is productive of some yellowish sputum.  He has not noted fever or chills.  He is a former smoker. Past Medical History:  Diagnosis Date  . Anxiety   . Arthritis    "all over" (01/07/2015)  . CKD (chronic kidney disease), stage IV ([place])   . Depression   . Headache    "probably weekly" (01/07/2015)  . Heart attack ([place]) 07/2014  . History of echocardiogram 12/2014   EF 20-25%  . Homelessness   . Hypercholesterolemia   . Hypertension   . Ischemic dilated cardiomyopathy ([place])   . Noncompliance   . NSTEMI (non-ST elevated myocardial infarction) ([place]) 01/07/2015  . Polysubstance abuse (HCC)    etoh, cocaine  . Urinary hesitancy   . Walking pneumonia 07/2014    Patient Active Problem List   Diagnosis Date Noted  . Neuropathy 03/03/2017  . Lower urinary tract symptoms 01/06/2017  . Syncope 07/31/2016  . Acute on chronic systolic heart failure ([place]) 07/05/2016  . CHF exacerbation ([place]) 07/05/2016  . Macrocytosis without anemia 07/05/2016  . Hypotension 06/14/2016  . Generalized anxiety disorder 06/13/2016  . Heart failure ([place]) 06/11/2016  . Dehydration 04/29/2016  . Dermatitis 01/27/2016  . Arthralgia 08/25/2015  . Hyperbilirubinemia   . Renal failure (ARF),  acute on chronic (HCC)   . Dyspnea   . CKD (chronic kidney disease) stage 3, GFR 30-59 ml/min ([place]) 05/03/2015  . Chronic combined systolic and diastolic congestive heart failure ([place]) 05/03/2015  . Benign essential HTN 05/03/2015  . History of non-ST elevation myocardial infarction (NSTEMI) 05/03/2015  . Abnormal LFTs 05/03/2015  . Polysubstance abuse ([place])   . Noncompliance   . Gout 03/24/2015  . Hypotensive episode 09/03/2014    Past Surgical History:  Procedure Laterality Date  . CARDIAC CATHETERIZATION    . LEFT HEART CATHETERIZATION WITH CORONARY ANGIOGRAM N/A 08/15/2014   Procedure: LEFT HEART CATHETERIZATION WITH CORONARY ANGIOGRAM;  Surgeon: Clent Demark, MD;  Location: [facility] CATH LAB;  Service: Cardiovascular;  Laterality: N/A;       Home Medications    Prior to Admission medications   Medication Sig Start Date End Date Taking? Authorizing Provider  albuterol (PROAIR HFA) 108 (90 Base) MCG/ACT inhaler Inhale 1-2 puffs into the lungs every 6 (six) hours as needed for wheezing or shortness of breath. 01/06/17   [name], MD  allopurinol (ZYLOPRIM) 300 MG tablet Take 0.5 tablets (150 mg total) by mouth daily. 11/15/16   [name], MD  aspirin 81 MG EC tablet Take 1 tablet (81 mg total) by mouth daily. 01/06/17   [name], MD  atorvastatin (LIPITOR) 80 MG tablet Take 80 mg by mouth daily.    [provider]  b complex vitamins tablet Take 1 tablet by mouth daily.    [provider]  betamethasone valerate (VALISONE) 0.1 % cream APPLY TOPICALLY 2 TIMES DAILY. 11/15/16   [name], MD  cetirizine (ZYRTEC) 10 MG tablet Take 1 tablet (10 mg total) by mouth daily. 05/16/17   Tresa Garter, MD  clopidogrel (PLAVIX) 75 MG tablet Take 75 mg by mouth daily. 11/08/16   [provider]  clotrimazole (LOTRIMIN) 1 % cream Apply 1 application topically 2 (two) times daily. 01/06/17   [name], MD  digoxin (LANOXIN) 0.125 MG tablet Take 1  tablet (0.125 mg total) by mouth daily. 08/05/16   Charolette Forward, MD  fluticasone (FLONASE) 50 MCG/ACT nasal spray Place 2 sprays into both nostrils daily. (120/4=30) 02/20/17   [name], MD  furosemide (LASIX) 80 MG tablet Take 2 tablets (160 mg total) by mouth 2 (two) times daily. 02/27/17   Daleen Bo, MD  gabapentin (NEURONTIN) 300 MG capsule Take 1 capsule (300 mg total) by mouth 2 (two) times daily. 06/19/17   [name], MD  ivabradine (CORLANOR) 5 MG TABS tablet Take 5 mg by mouth 2 (two) times daily with a meal.    [provider]  Multiple Vitamin (MULTIVITAMIN WITH MINERALS) TABS tablet Take 1 tablet by mouth daily.    [provider]  nitroGLYCERIN (NITROSTAT) 0.4 MG SL tablet Place 1 tablet (0.4 mg total) under the tongue every 5 (five) minutes x 3 doses as needed for chest pain. 01/06/17   [name], MD  PARoxetine (PAXIL) 20 MG tablet Take 1 tablet (20 mg total) by mouth daily. 06/12/16   Minus Liberty, MD  polyethylene glycol [facility]) packet Take 17 g by mouth daily. 01/06/17   [name], MD  potassium chloride SA (K-DUR,KLOR-CON) 20 MEQ tablet Take 20 mEq by mouth daily. 06/04/16   [provider]  ranitidine (ZANTAC) 150 MG tablet Take 1 tablet (150 mg total) by mouth 2 (two) times daily. 02/22/17   [name], MD  sacubitril-valsartan (ENTRESTO) 24-26 MG Take 1 tablet by mouth 2 (two) times daily. 08/04/16   Charolette Forward, MD  tamsulosin (FLOMAX) 0.4 MG CAPS capsule Take 1 capsule (0.4 mg total) by mouth daily. 05/16/17   Tresa Garter, MD  triamcinolone cream (KENALOG) 0.1 % Apply 1 application topically 2 (two) times daily as needed (itching).  06/28/16   [provider]    Family History No family history on file.  Social History Social History   Tobacco Use  . Smoking status: Former Smoker    Packs/day: 1.50    Years: 30.00    Pack years: 45.00    Types: Cigarettes    Last attempt to quit: 02/19/2004     Years since quitting: 13.3  . Smokeless tobacco: Former Systems developer  . Tobacco comment: quit 2005  Substance Use Topics  . Alcohol use: Yes    Alcohol/week: 1.2 - 2.4 oz    Types: 2 - 4 Cans of beer per week    Comment: once weekly  . Drug use: No    Comment: former     Allergies   Tape   Review of Systems Review of Systems  Constitutional: Negative.   HENT: Positive for congestion.   Eyes: Negative.   Respiratory: Positive for cough. Negative for shortness of breath.   Cardiovascular: Negative.  Negative for chest pain.  Gastrointestinal: Negative.   Endocrine: Negative.   Genitourinary: Negative.   Musculoskeletal: Negative.  Skin: Positive for rash.  Neurological: Negative.   Hematological: Negative.   Psychiatric/Behavioral: Negative.   All other systems reviewed and are negative.    Physical Exam Updated Vital Signs BP 133/87 (BP Location: Right Arm)   Pulse 83   Temp 98.9 F (37.2 C) (Oral)   Resp 16   Ht 1.727 m (5\' 8" )   Wt 76.2 kg (168 lb)   SpO2 100%   BMI 25.54 kg/m   Physical Exam  Constitutional: He is oriented to person, place, and time. He appears well-developed and well-nourished. No distress.  HENT:  Head: Normocephalic and atraumatic.  Right Ear: External ear normal.  Left Ear: External ear normal.  Mouth/Throat: Oropharynx is clear and moist.  Eyes: EOM are normal. Pupils are equal, round, and reactive to light.  Neck: Normal range of motion. Neck supple.  Cardiovascular: Normal rate and regular rhythm.  Pulmonary/Chest: Effort normal and breath sounds normal.  Abdominal: Soft. Bowel sounds are normal.  Musculoskeletal: Normal range of motion.  Neurological: He is alert and oriented to person, place, and time.  Skin: Rash noted.  Diffuse papular dark colored rash that is itchy but not raised and not blanch.  Psychiatric: He has a normal mood and affect.  Nursing note and vitals reviewed.    ED Treatments / Results  Labs (all labs  ordered are listed, but only abnormal results are displayed) Labs Reviewed - No data to display  EKG  EKG Interpretation None       Radiology No results found.  Procedures Procedures (including critical care time)  Medications Ordered in ED Medications - No data to display   Initial Impression / Assessment and Plan / ED Course  I have reviewed the triage vital signs and the nursing notes.  Pertinent labs & imaging results that were available during my care of the patient were reviewed by me and considered in my medical decision making (see chart for details).     1 rash consistent with contact dermatitis.  Plan Short course of steroids 2 upper respiratory infection with cough shortness of breath and normal exam. Final Clinical Impressions(s) / ED Diagnoses   Final diagnoses:  Contact dermatitis, unspecified contact dermatitis type, unspecified trigger  Upper respiratory tract infection, unspecified type    ED Discharge Orders        Ordered    predniSONE (STERAPRED UNI-PAK 21 TAB) 10 MG (21) TBPK tablet  Daily     07/02/17 1539       Pattricia Boss, MD 07/02/17 1540

## 2017-07-10 ENCOUNTER — Other Ambulatory Visit: Payer: Self-pay | Admitting: Family Medicine

## 2017-07-26 ENCOUNTER — Other Ambulatory Visit: Payer: Self-pay | Admitting: Family Medicine

## 2017-07-26 DIAGNOSIS — R0981 Nasal congestion: Secondary | ICD-10-CM

## 2017-08-23 ENCOUNTER — Other Ambulatory Visit: Payer: Self-pay | Admitting: Family Medicine

## 2017-08-23 DIAGNOSIS — R0981 Nasal congestion: Secondary | ICD-10-CM

## 2017-09-01 ENCOUNTER — Other Ambulatory Visit: Payer: Self-pay | Admitting: Family Medicine

## 2017-09-01 ENCOUNTER — Other Ambulatory Visit: Payer: Self-pay | Admitting: Internal Medicine

## 2017-09-01 DIAGNOSIS — G629 Polyneuropathy, unspecified: Secondary | ICD-10-CM

## 2017-09-20 ENCOUNTER — Other Ambulatory Visit: Payer: Self-pay | Admitting: Family Medicine

## 2017-09-20 DIAGNOSIS — R0981 Nasal congestion: Secondary | ICD-10-CM

## 2017-10-16 ENCOUNTER — Other Ambulatory Visit: Payer: Self-pay | Admitting: Family Medicine

## 2017-10-16 DIAGNOSIS — R0981 Nasal congestion: Secondary | ICD-10-CM

## 2017-12-05 ENCOUNTER — Other Ambulatory Visit: Payer: Self-pay | Admitting: Family Medicine

## 2017-12-05 DIAGNOSIS — G629 Polyneuropathy, unspecified: Secondary | ICD-10-CM

## 2017-12-18 ENCOUNTER — Emergency Department (HOSPITAL_COMMUNITY)
Admission: EM | Admit: 2017-12-18 | Discharge: 2017-12-18 | Disposition: A | Discharge status: Home / Self Care | Payer: Medicaid Other | Attending: Emergency Medicine | Admitting: Emergency Medicine

## 2017-12-18 ENCOUNTER — Encounter (HOSPITAL_COMMUNITY): Payer: Self-pay | Admitting: Emergency Medicine

## 2017-12-18 ENCOUNTER — Other Ambulatory Visit: Payer: Self-pay

## 2017-12-18 ENCOUNTER — Emergency Department (HOSPITAL_COMMUNITY): Payer: Medicaid Other

## 2017-12-18 DIAGNOSIS — Z7982 Long term (current) use of aspirin: Secondary | ICD-10-CM | POA: Insufficient documentation

## 2017-12-18 DIAGNOSIS — I130 Hypertensive heart and chronic kidney disease with heart failure and stage 1 through stage 4 chronic kidney disease, or unspecified chronic kidney disease: Secondary | ICD-10-CM | POA: Diagnosis not present

## 2017-12-18 DIAGNOSIS — Z87891 Personal history of nicotine dependence: Secondary | ICD-10-CM | POA: Insufficient documentation

## 2017-12-18 DIAGNOSIS — E1122 Type 2 diabetes mellitus with diabetic chronic kidney disease: Secondary | ICD-10-CM | POA: Insufficient documentation

## 2017-12-18 DIAGNOSIS — R21 Rash and other nonspecific skin eruption: Secondary | ICD-10-CM | POA: Diagnosis present

## 2017-12-18 DIAGNOSIS — M792 Neuralgia and neuritis, unspecified: Secondary | ICD-10-CM | POA: Diagnosis not present

## 2017-12-18 DIAGNOSIS — Z7902 Long term (current) use of antithrombotics/antiplatelets: Secondary | ICD-10-CM | POA: Insufficient documentation

## 2017-12-18 DIAGNOSIS — Z79899 Other long term (current) drug therapy: Secondary | ICD-10-CM | POA: Insufficient documentation

## 2017-12-18 DIAGNOSIS — N183 Chronic kidney disease, stage 3 (moderate): Secondary | ICD-10-CM | POA: Diagnosis not present

## 2017-12-18 DIAGNOSIS — I5042 Chronic combined systolic (congestive) and diastolic (congestive) heart failure: Secondary | ICD-10-CM | POA: Diagnosis not present

## 2017-12-18 LAB — CBC
HEMATOCRIT: 40.5 % (ref 39.0–52.0)
HEMOGLOBIN: 14.1 g/dL (ref 13.0–17.0)
MCH: 36.2 pg — ABNORMAL HIGH (ref 26.0–34.0)
MCHC: 34.8 g/dL (ref 30.0–36.0)
MCV: 103.8 fL — ABNORMAL HIGH (ref 78.0–100.0)
Platelets: 179 10*3/uL (ref 150–400)
RBC: 3.9 MIL/uL — ABNORMAL LOW (ref 4.22–5.81)
RDW: 11.8 % (ref 11.5–15.5)
WBC: 4.1 10*3/uL (ref 4.0–10.5)

## 2017-12-18 LAB — BASIC METABOLIC PANEL
ANION GAP: 8 (ref 5–15)
BUN: 20 mg/dL (ref 6–20)
CO2: 23 mmol/L (ref 22–32)
Calcium: 9.6 mg/dL (ref 8.9–10.3)
Chloride: 109 mmol/L (ref 101–111)
Creatinine, Ser: 1.82 mg/dL — ABNORMAL HIGH (ref 0.61–1.24)
GFR calc Af Amer: 44 mL/min — ABNORMAL LOW (ref >60)
GFR, EST NON AFRICAN AMERICAN: 38 mL/min — AB (ref >60)
GLUCOSE: 84 mg/dL (ref 65–99)
POTASSIUM: 4.4 mmol/L (ref 3.5–5.1)
Sodium: 140 mmol/L (ref 135–145)

## 2017-12-18 LAB — I-STAT TROPONIN, ED: Troponin i, poc: 0.03 ng/mL (ref 0.00–0.08)

## 2017-12-18 MED ORDER — TRIAMCINOLONE ACETONIDE 0.1 % EX CREA: 1.0000 "application " | TOPICAL_CREAM | Freq: Two times a day (BID) | CUTANEOUS | 0 refills | Status: DC

## 2017-12-18 MED ORDER — GABAPENTIN 300 MG PO CAPS: 300.0000 mg | ORAL_CAPSULE | Freq: Two times a day (BID) | ORAL | 0 refills | Status: DC

## 2017-12-18 NOTE — ED Provider Notes (Signed)
[facility] EMERGENCY DEPARTMENT Provider Note   CSN: [id number] Arrival date & time: 12/18/17  0935     History   Chief Complaint Chief Complaint  Patient presents with  . Numbness  . Rash    HPI [name] is a 62 y.o. male who presents with a rash and numbness and tingling in his right arm.  Past medical history significant for CHF, history of NSTEMI, history of polysubstance abuse, chronic kidney disease.  The patient states that he has had a very itchy rash on his legs that has been present for months.  The rash started on his right foot and has slowly spread up his leg and is also on his left leg.  He has been scratching it a lot to the point where his skin has hardened and it bleeds and has some drainage.  He takes Benadryl which provides relief temporarily.  He has tried Eucerin over-the-counter without significant relief. He has had a rash on his legs in the past too but it's never been this bad. He has an appointment with his primary doctor on Wednesday but felt felt like he could not wait so therefore came to the emergency department  Additionally he notes that he has had intermittent numbness and tingling down his right arm for the past several months as well.  It radiates from his neck and upper back down his right arm to his little finger.  Symptoms are intermittent and worse with movement of his neck.  He denies trauma to the area or weakness.  HPI  Past Medical History:  Diagnosis Date  . Anxiety   . Arthritis    "all over" (01/07/2015)  . CKD (chronic kidney disease), stage IV ([place])   . Depression   . Headache    "probably weekly" (01/07/2015)  . Heart attack ([place]) 07/2014  . History of echocardiogram 12/2014   EF 20-25%  . Homelessness   . Hypercholesterolemia   . Hypertension   . Ischemic dilated cardiomyopathy ([place])   . Noncompliance   . NSTEMI (non-ST elevated myocardial infarction) ([place]) 01/07/2015  . Polysubstance abuse (HCC)    etoh,  cocaine  . Urinary hesitancy   . Walking pneumonia 07/2014    Patient Active Problem List   Diagnosis Date Noted  . Neuropathy 03/03/2017  . Lower urinary tract symptoms 01/06/2017  . Syncope 07/31/2016  . Acute on chronic systolic heart failure ([place]) 07/05/2016  . CHF exacerbation ([place]) 07/05/2016  . Macrocytosis without anemia 07/05/2016  . Hypotension 06/14/2016  . Generalized anxiety disorder 06/13/2016  . Heart failure ([place]) 06/11/2016  . Dehydration 04/29/2016  . Dermatitis 01/27/2016  . Arthralgia 08/25/2015  . Hyperbilirubinemia   . Renal failure (ARF), acute on chronic (HCC)   . Dyspnea   . CKD (chronic kidney disease) stage 3, GFR 30-59 ml/min ([place]) 05/03/2015  . Chronic combined systolic and diastolic congestive heart failure ([place]) 05/03/2015  . Benign essential HTN 05/03/2015  . History of non-ST elevation myocardial infarction (NSTEMI) 05/03/2015  . Abnormal LFTs 05/03/2015  . Polysubstance abuse ([place])   . Noncompliance   . Gout 03/24/2015  . Hypotensive episode 09/03/2014    Past Surgical History:  Procedure Laterality Date  . CARDIAC CATHETERIZATION    . LEFT HEART CATHETERIZATION WITH CORONARY ANGIOGRAM N/A 08/15/2014   Procedure: LEFT HEART CATHETERIZATION WITH CORONARY ANGIOGRAM;  Surgeon: Clent Demark, MD;  Location: [facility] CATH LAB;  Service: Cardiovascular;  Laterality: N/A;  Home Medications    Prior to Admission medications   Medication Sig Start Date End Date Taking? Authorizing Provider  albuterol (PROAIR HFA) 108 (90 Base) MCG/ACT inhaler Inhale 1-2 puffs into the lungs every 6 (six) hours as needed for wheezing or shortness of breath. 01/06/17   Charlott Rakes, MD  allopurinol (ZYLOPRIM) 300 MG tablet Take 0.5 tablets (150 mg total) by mouth daily. 11/15/16   Charlott Rakes, MD  aspirin 81 MG EC tablet Take 1 tablet (81 mg total) by mouth daily. 01/06/17   Charlott Rakes, MD  atorvastatin (LIPITOR) 80 MG tablet Take 80 mg by mouth daily.     [provider]  b complex vitamins tablet Take 1 tablet by mouth daily.    [provider]  betamethasone valerate (VALISONE) 0.1 % cream APPLY TOPICALLY 2 TIMES DAILY. 11/15/16   Charlott Rakes, MD  cetirizine (ZYRTEC) 10 MG tablet Take 1 tablet (10 mg total) by mouth daily. 05/16/17   Tresa Garter, MD  clopidogrel (PLAVIX) 75 MG tablet Take 75 mg by mouth daily. 11/08/16   [provider]  clotrimazole (LOTRIMIN) 1 % cream Apply 1 application topically 2 (two) times daily. 01/06/17   Charlott Rakes, MD  digoxin (LANOXIN) 0.125 MG tablet Take 1 tablet (0.125 mg total) by mouth daily. 08/05/16   Charolette Forward, MD  fluticasone (FLONASE) 50 MCG/ACT nasal spray Place 2 sprays into both nostrils daily. (120/4=30) 10/17/17   Charlott Rakes, MD  furosemide (LASIX) 80 MG tablet Take 2 tablets (160 mg total) by mouth 2 (two) times daily. 02/27/17   Daleen Bo, MD  gabapentin (NEURONTIN) 300 MG capsule Take 1 capsule (300 mg total) by mouth 2 (two) times daily. 06/19/17   Charlott Rakes, MD  ivabradine (CORLANOR) 5 MG TABS tablet Take 5 mg by mouth 2 (two) times daily with a meal.    [provider]  Multiple Vitamin (MULTIVITAMIN WITH MINERALS) TABS tablet Take 1 tablet by mouth daily.    [provider]  nitroGLYCERIN (NITROSTAT) 0.4 MG SL tablet Place 1 tablet (0.4 mg total) under the tongue every 5 (five) minutes x 3 doses as needed for chest pain. 01/06/17   Charlott Rakes, MD  PARoxetine (PAXIL) 20 MG tablet Take 1 tablet (20 mg total) by mouth daily. 06/12/16   Minus Liberty, MD  polyethylene glycol [facility]) packet Take 17 g by mouth daily. 01/06/17   Charlott Rakes, MD  potassium chloride SA (K-DUR,KLOR-CON) 20 MEQ tablet Take 20 mEq by mouth daily. 06/04/16   [provider]  predniSONE (STERAPRED UNI-PAK 21 TAB) 10 MG (21) TBPK tablet Take by mouth daily. Take 6 tabs by mouth daily  for 2 days, then 5 tabs for 2 days, then 4  tabs for 2 days, then 3 tabs for 2 days, 2 tabs for 2 days, then 1 tab by mouth daily for 2 days 07/02/17   Pattricia Boss, MD  ranitidine (ZANTAC) 150 MG tablet Take 1 tablet (150 mg total) by mouth 2 (two) times daily. 02/22/17   Charlott Rakes, MD  sacubitril-valsartan (ENTRESTO) 24-26 MG Take 1 tablet by mouth 2 (two) times daily. 08/04/16   Charolette Forward, MD  tamsulosin (FLOMAX) 0.4 MG CAPS capsule Take 1 capsule (0.4 mg total) by mouth daily. 09/01/17   Charlott Rakes, MD  triamcinolone cream (KENALOG) 0.1 % Apply 1 application topically 2 (two) times daily as needed (itching).  06/28/16   [provider]    Family History History reviewed. No pertinent family  history.  Social History Social History   Tobacco Use  . Smoking status: Former Smoker    Packs/day: 1.50    Years: 30.00    Pack years: 45.00    Types: Cigarettes    Last attempt to quit: 02/19/2004    Years since quitting: 13.8  . Smokeless tobacco: Former Systems developer  . Tobacco comment: quit 2005  Substance Use Topics  . Alcohol use: Yes    Alcohol/week: 1.2 - 2.4 oz    Types: 2 - 4 Cans of beer per week    Comment: once weekly  . Drug use: No    Comment: former     Allergies   Tape   Review of Systems Review of Systems  Constitutional: Negative for fever.  Cardiovascular: Negative for chest pain and leg swelling.  Musculoskeletal: Positive for myalgias and neck pain.  Skin: Positive for rash.  Neurological: Positive for numbness. Negative for weakness.  All other systems reviewed and are negative.    Physical Exam Updated Vital Signs BP (!) 153/89   Pulse 80   Temp 98.6 F (37 C) (Oral)   Resp 18   Ht 5\' 8"  (1.727 m)   Wt 79.4 kg (175 lb)   SpO2 99%   BMI 26.61 kg/m   Physical Exam  Constitutional: He is oriented to person, place, and time. He appears well-developed and well-nourished. No distress.  Calm and cooperative  HENT:  Head: Normocephalic and atraumatic.  Eyes: Pupils are equal,  round, and reactive to light. Conjunctivae are normal. Right eye exhibits no discharge. Left eye exhibits no discharge. No scleral icterus.  Neck: Normal range of motion.  Mild midline neck tenderness.  Full range of motion of bilateral upper extremities with 5 out of 5 strength.  2+ radial pulse bilaterally.  Subjective numbness of the right middle finger  Cardiovascular: Normal rate and regular rhythm.  Pulmonary/Chest: Effort normal and breath sounds normal. No respiratory distress.  Abdominal: He exhibits no distension.  Neurological: He is alert and oriented to person, place, and time.  Skin: Skin is warm and dry. Rash (Diffuse, lichenified, excoriated rash on the bilateral lower extremities extending from the feet to the knees.  No erythema, tenderness, drainage ) noted.  Psychiatric: He has a normal mood and affect. His behavior is normal.  Nursing note and vitals reviewed.    ED Treatments / Results  Labs (all labs ordered are listed, but only abnormal results are displayed) Labs Reviewed  BASIC METABOLIC PANEL - Abnormal; Notable for the following components:      Result Value   Creatinine, Ser 1.82 (*)    GFR calc non Af Amer 38 (*)    GFR calc Af Amer 44 (*)    All other components within normal limits  CBC - Abnormal; Notable for the following components:   RBC 3.90 (*)    MCV 103.8 (*)    MCH 36.2 (*)    All other components within normal limits  I-STAT TROPONIN, ED    EKG EKG Interpretation  Date/Time:  Monday December 18 2017 09:42:51 EDT Ventricular Rate:  97 PR Interval:  182 QRS Duration: 192 QT Interval:  420 QTC Calculation: 533 R Axis:   -116 Text Interpretation:  Normal sinus rhythm Right bundle branch block Septal infarct , age undetermined No significant change since last tracing Abnormal ekg Confirmed by Carmin Muskrat [phone]) on 12/18/2017 9:48:44 AM Also confirmed by Carmin Muskrat ([id number]), editor Wilder, Jeannetta Nap [phone])  on 12/18/2017 10:40:58  AM   Radiology Dg Chest 2 View  Result Date: 12/18/2017 CLINICAL DATA:  Intermittent shortness of breath.  Pruritis EXAM: CHEST - 2 VIEW COMPARISON:  February 27, 2017 FINDINGS: Lungs are clear. Heart size and pulmonary vascularity are normal. No adenopathy. No bone lesions. IMPRESSION: No edema or consolidation. Electronically Signed   By: Lowella Grip III M.D.   On: 12/18/2017 10:08    Procedures Procedures (including critical care time)  Medications Ordered in ED Medications - No data to display   Initial Impression / Assessment and Plan / ED Course  I have reviewed the triage vital signs and the nursing notes.  Pertinent labs & imaging results that were available during my care of the patient were reviewed by me and considered in my medical decision making (see chart for details).  62 year old male presents with chronic rash on the bilateral lower extremities which is very itchy.  It appears consistent with contact dermatitis which has lichenified due to him scratching excessively.  He has an appointment with his primary care doctor on Wednesday.  He was advised to start a steroid cream, continue Benadryl, and to follow-up with his primary doctor.  He also complains of what sounds like radicular pain from his neck to his pinky finger on the right side.  This is also chronic.  He denies recent trauma to the area.  Symptoms are intermittent he does not have any weakness.  He was prescribed gabapentin in the past and is out.  We will refill his medicine for him today and have him follow-up with his primary doctor.  Final Clinical Impressions(s) / ED Diagnoses   Final diagnoses:  Radicular pain in right arm  Rash and nonspecific skin eruption    ED Discharge Orders    None       Recardo Evangelist, PA-C 12/18/17 1326    Davonna Belling, MD 12/18/17 2238

## 2017-12-18 NOTE — ED Triage Notes (Signed)
Pt arrives via POV from home with chief c/o rash and itching to lower leg. Pt also mentions he's had numbness which comes and goes in his right arm radiating to his back. Denies CP, c/o some SOB, hx of MI per patient. Pt skin warm, dry, resp even unlabored.

## 2017-12-18 NOTE — ED Notes (Signed)
Patient unable to sign discharge.

## 2017-12-18 NOTE — ED Notes (Signed)
Patient ambulated to the restroom

## 2017-12-18 NOTE — Discharge Instructions (Signed)
Continue Benadryl for itching and avoid scratching as much as possible Apply Triamcinolone cream twice daily Re-start Gabapentin for nerve pain Please follow up with your doctor

## 2018-01-03 ENCOUNTER — Ambulatory Visit: Payer: Medicaid Other | Attending: Family Medicine | Admitting: Family Medicine

## 2018-01-03 ENCOUNTER — Encounter: Payer: Self-pay | Admitting: Family Medicine

## 2018-01-03 VITALS — BP 121/70 | HR 55 | Temp 97.5°F | Ht 68.0 in | Wt 170.8 lb

## 2018-01-03 DIAGNOSIS — G8929 Other chronic pain: Secondary | ICD-10-CM | POA: Diagnosis not present

## 2018-01-03 DIAGNOSIS — L309 Dermatitis, unspecified: Secondary | ICD-10-CM | POA: Diagnosis not present

## 2018-01-03 DIAGNOSIS — F329 Major depressive disorder, single episode, unspecified: Secondary | ICD-10-CM | POA: Diagnosis not present

## 2018-01-03 DIAGNOSIS — I130 Hypertensive heart and chronic kidney disease with heart failure and stage 1 through stage 4 chronic kidney disease, or unspecified chronic kidney disease: Secondary | ICD-10-CM | POA: Diagnosis not present

## 2018-01-03 DIAGNOSIS — N183 Chronic kidney disease, stage 3 unspecified: Secondary | ICD-10-CM

## 2018-01-03 DIAGNOSIS — E7800 Pure hypercholesterolemia, unspecified: Secondary | ICD-10-CM | POA: Insufficient documentation

## 2018-01-03 DIAGNOSIS — N184 Chronic kidney disease, stage 4 (severe): Secondary | ICD-10-CM | POA: Insufficient documentation

## 2018-01-03 DIAGNOSIS — Z7982 Long term (current) use of aspirin: Secondary | ICD-10-CM | POA: Insufficient documentation

## 2018-01-03 DIAGNOSIS — I252 Old myocardial infarction: Secondary | ICD-10-CM | POA: Diagnosis not present

## 2018-01-03 DIAGNOSIS — I5042 Chronic combined systolic (congestive) and diastolic (congestive) heart failure: Secondary | ICD-10-CM | POA: Diagnosis not present

## 2018-01-03 DIAGNOSIS — I10 Essential (primary) hypertension: Secondary | ICD-10-CM

## 2018-01-03 DIAGNOSIS — F419 Anxiety disorder, unspecified: Secondary | ICD-10-CM | POA: Insufficient documentation

## 2018-01-03 DIAGNOSIS — M25511 Pain in right shoulder: Secondary | ICD-10-CM

## 2018-01-03 MED ORDER — GABAPENTIN 300 MG PO CAPS: 600.0000 mg | ORAL_CAPSULE | Freq: Two times a day (BID) | ORAL | 3 refills | Status: DC

## 2018-01-03 MED ORDER — TIZANIDINE HCL 4 MG PO TABS: 4.0000 mg | ORAL_TABLET | Freq: Four times a day (QID) | ORAL | 3 refills | Status: DC | PRN

## 2018-01-03 NOTE — Progress Notes (Signed)
Subjective:  Patient ID: [name], male    DOB: 1956/07/08  Age: 62 y.o. MRN: [id number]  CC: Hypertension   HPI [name] is a 62 year old male with a history of CHF (EF 20-25% from 2-D echo of 05/2016), chronic kidney disease stage III, Gout, dermatitis who presents today for a follow-up visit; last visit to the clinic was 10 months ago. He denies shortness of breath, pedal edema or chest pain and was last seen by his cardiologist a couple of weeks ago and he reports doing well on his antihypertensive and Lasix. He denies recent gout flares and has been compliant with allopurinol. He had an ED visit 2 weeks ago for nonspecific rash which started on his legs and spread to involve his arms.  He was treated with topical steroids and reports resolution of the rash. He complains of a 3 to 69-month history of right shoulder pain which radiates from his posterior right shoulder region down his right upper extremity with associated numbness which led him to obtaining a Vicodin tablet from a family member which improved his symptoms.  Denies recent trauma and denies dropping things from his right upper extremity.  Past Medical History:  Diagnosis Date  . Anxiety   . Arthritis    "all over" (01/07/2015)  . CKD (chronic kidney disease), stage IV ([place])   . Depression   . Headache    "probably weekly" (01/07/2015)  . Heart attack ([place]) 07/2014  . History of echocardiogram 12/2014   EF 20-25%  . Homelessness   . Hypercholesterolemia   . Hypertension   . Ischemic dilated cardiomyopathy ([place])   . Noncompliance   . NSTEMI (non-ST elevated myocardial infarction) ([place]) 01/07/2015  . Polysubstance abuse (HCC)    etoh, cocaine  . Urinary hesitancy   . Walking pneumonia 07/2014    Past Surgical History:  Procedure Laterality Date  . CARDIAC CATHETERIZATION    . LEFT HEART CATHETERIZATION WITH CORONARY ANGIOGRAM N/A 08/15/2014   Procedure: LEFT HEART CATHETERIZATION WITH CORONARY ANGIOGRAM;   Surgeon: Clent Demark, MD;  Location: [facility] CATH LAB;  Service: Cardiovascular;  Laterality: N/A;    Allergies  Allergen Reactions  . Tape Rash     Outpatient Medications Prior to Visit  Medication Sig Dispense Refill  . albuterol (PROAIR HFA) 108 (90 Base) MCG/ACT inhaler Inhale 1-2 puffs into the lungs every 6 (six) hours as needed for wheezing or shortness of breath. 8.5 g 1  . allopurinol (ZYLOPRIM) 300 MG tablet Take 0.5 tablets (150 mg total) by mouth daily. 30 tablet 3  . aspirin 81 MG EC tablet Take 1 tablet (81 mg total) by mouth daily. 30 tablet 3  . atorvastatin (LIPITOR) 80 MG tablet Take 80 mg by mouth daily.    Marland Kitchen b complex vitamins tablet Take 1 tablet by mouth daily.    . digoxin (LANOXIN) 0.125 MG tablet Take 1 tablet (0.125 mg total) by mouth daily. 30 tablet 3  . furosemide (LASIX) 80 MG tablet Take 2 tablets (160 mg total) by mouth 2 (two) times daily. 50 tablet 0  . ivabradine (CORLANOR) 5 MG TABS tablet Take 5 mg by mouth 2 (two) times daily with a meal.    . potassium chloride SA (K-DUR,KLOR-CON) 20 MEQ tablet Take 20 mEq by mouth daily.  3  . ranitidine (ZANTAC) 150 MG tablet Take 1 tablet (150 mg total) by mouth 2 (two) times daily. 60 tablet 2  . sacubitril-valsartan (ENTRESTO) 24-26 MG Take 1  tablet by mouth 2 (two) times daily. 60 tablet 3  . gabapentin (NEURONTIN) 300 MG capsule Take 1 capsule (300 mg total) by mouth 2 (two) times daily. 30 capsule 0  . betamethasone valerate (VALISONE) 0.1 % cream APPLY TOPICALLY 2 TIMES DAILY. (Patient not taking: Reported on 01/03/2018) 30 g 1  . cetirizine (ZYRTEC) 10 MG tablet Take 1 tablet (10 mg total) by mouth daily. (Patient not taking: Reported on 01/03/2018) 30 tablet 0  . clopidogrel (PLAVIX) 75 MG tablet Take 75 mg by mouth daily.  3  . clotrimazole (LOTRIMIN) 1 % cream Apply 1 application topically 2 (two) times daily. (Patient not taking: Reported on 01/03/2018) 30 g 0  . Multiple Vitamin (MULTIVITAMIN WITH  MINERALS) TABS tablet Take 1 tablet by mouth daily.    . nitroGLYCERIN (NITROSTAT) 0.4 MG SL tablet Place 1 tablet (0.4 mg total) under the tongue every 5 (five) minutes x 3 doses as needed for chest pain. (Patient not taking: Reported on 01/03/2018) 30 tablet 1  . polyethylene glycol (MIRALAX) packet Take 17 g by mouth daily. (Patient not taking: Reported on 01/03/2018) 14 each 0  . predniSONE (STERAPRED UNI-PAK 21 TAB) 10 MG (21) TBPK tablet Take by mouth daily. Take 6 tabs by mouth daily  for 2 days, then 5 tabs for 2 days, then 4 tabs for 2 days, then 3 tabs for 2 days, 2 tabs for 2 days, then 1 tab by mouth daily for 2 days (Patient not taking: Reported on 01/03/2018) 42 tablet 0  . tamsulosin (FLOMAX) 0.4 MG CAPS capsule Take 1 capsule (0.4 mg total) by mouth daily. (Patient not taking: Reported on 01/03/2018) 30 capsule 0  . triamcinolone cream (KENALOG) 0.1 % Apply 1 application topically 2 (two) times daily. (Patient not taking: Reported on 01/03/2018) 453.6 g 0  . fluticasone (FLONASE) 50 MCG/ACT nasal spray Place 2 sprays into both nostrils daily. (120/4=30) (Patient not taking: Reported on 01/03/2018) 16 g 0  . PARoxetine (PAXIL) 20 MG tablet Take 1 tablet (20 mg total) by mouth daily. (Patient not taking: Reported on 01/03/2018) 30 tablet 3   No facility-administered medications prior to visit.     ROS Review of Systems  Constitutional: Negative for activity change and appetite change.  HENT: Negative for sinus pressure and sore throat.   Eyes: Negative for visual disturbance.  Respiratory: Negative for cough, chest tightness and shortness of breath.   Cardiovascular: Negative for chest pain and leg swelling.  Gastrointestinal: Negative for abdominal distention, abdominal pain, constipation and diarrhea.  Endocrine: Negative.   Genitourinary: Negative for dysuria.  Musculoskeletal:       See hpi  Skin: Negative for rash.  Allergic/Immunologic: Negative.   Neurological: Negative for  weakness, light-headedness and numbness.  Psychiatric/Behavioral: Negative for dysphoric mood and suicidal ideas.    Objective:  BP 121/70   Pulse (!) 55   Temp (!) 97.5 F (36.4 C) (Oral)   Ht 5\' 8"  (1.727 m)   Wt 170 lb 12.8 oz (77.5 kg)   SpO2 99%   BMI 25.97 kg/m   BP/Weight 01/03/2018 12/18/2017 40/97/3532  Systolic BP 992 426 834  Diastolic BP 70 85 78  Wt. (Lbs) 170.8 175 168  BMI 25.97 26.61 25.54      Physical Exam  Constitutional: He is oriented to person, place, and time. He appears well-developed and well-nourished.  Cardiovascular: Normal rate, normal heart sounds and intact distal pulses.  No murmur heard. Pulmonary/Chest: Effort normal and breath sounds normal.  He has no wheezes. He has no rales. He exhibits no tenderness.  Abdominal: Soft. Bowel sounds are normal. He exhibits no distension and no mass. There is no tenderness.  Musculoskeletal: He exhibits tenderness (TTP of right scapular region; FROM. Positive hawkins sign).  Neurological: He is alert and oriented to person, place, and time.  Skin: Skin is warm and dry.  Psychiatric: He has a normal mood and affect.    CMP Latest Ref Rng & Units 12/18/2017 03/03/2017 02/27/2017  Glucose 65 - 99 mg/dL 84 89 95  BUN 6 - 20 mg/dL 20 18 21(H)  Creatinine 0.61 - 1.24 mg/dL 1.82(H) 1.61(H) 1.62(H)  Sodium 135 - 145 mmol/L 140 143 141  Potassium 3.5 - 5.1 mmol/L 4.4 4.1 4.4  Chloride 101 - 111 mmol/L 109 104 110  CO2 22 - 32 mmol/L 23 23 20(L)  Calcium 8.9 - 10.3 mg/dL 9.6 9.5 9.5  Total Protein 6.5 - 8.1 g/dL - - -  Total Bilirubin 0.3 - 1.2 mg/dL - - -  Alkaline Phos 38 - 126 U/L - - -  AST 15 - 41 U/L - - -  ALT 17 - 63 U/L - - -     Assessment & Plan:   1. Chronic combined systolic and diastolic congestive heart failure (HCC) EF 20 to 25% Euvolemic Continue current regimen, heart healthy diet, fluid restriction, daily weights Keep appointment with cardiology  2. CKD (chronic kidney disease) stage 3,  GFR 30-59 ml/min (HCC) Last creatinine was 1.82 Avoid nephrotoxins We will monitor and if further trending down will refer to nephrology  3. Dermatitis Currently using topical steroid  4. Chronic right shoulder pain Possible right shoulder impingement Consider imaging if persisting - tiZANidine (ZANAFLEX) 4 MG tablet; Take 1 tablet (4 mg total) by mouth every 6 (six) hours as needed for muscle spasms.  Dispense: 30 tablet; Refill: 3 - gabapentin (NEURONTIN) 300 MG capsule; Take 2 capsules (600 mg total) by mouth 2 (two) times daily.  Dispense: 120 capsule; Refill: 3  5. Benign essential HTN Controlled Counseled on blood pressure goal of less than 130/80, low-sodium, DASH diet, medication compliance, 150 minutes of moderate intensity exercise per week. Discussed medication compliance, adverse effects.   Meds ordered this encounter  Medications  . tiZANidine (ZANAFLEX) 4 MG tablet    Sig: Take 1 tablet (4 mg total) by mouth every 6 (six) hours as needed for muscle spasms.    Dispense:  30 tablet    Refill:  3  . gabapentin (NEURONTIN) 300 MG capsule    Sig: Take 2 capsules (600 mg total) by mouth 2 (two) times daily.    Dispense:  120 capsule    Refill:  3    Follow-up: Return in about 3 months (around 04/05/2018) for Follow-up of chronic medical conditions.   Charlott Rakes MD

## 2018-01-03 NOTE — Patient Instructions (Signed)
Shoulder Pain Many things can cause shoulder pain, including:  An injury.  Moving the arm in the same way again and again (overuse).  Joint pain (arthritis).  Follow these instructions at home: Take these actions to help with your pain:  Squeeze a soft ball or a foam pad as much as you can. This helps to prevent swelling. It also makes the arm stronger.  Take over-the-counter and prescription medicines only as told by your doctor.  If told, put ice on the area: ? Put ice in a plastic bag. ? Place a towel between your skin and the bag. ? Leave the ice on for 20 minutes, 2-3 times per day. Stop putting on ice if it does not help with the pain.  If you were given a shoulder sling or immobilizer: ? Wear it as told. ? Remove it to shower or bathe. ? Move your arm as little as possible. ? Keep your hand moving. This helps prevent swelling.  Contact a doctor if:  Your pain gets worse.  Medicine does not help your pain.  You have new pain in your arm, hand, or fingers. Get help right away if:  Your arm, hand, or fingers: ? Tingle. ? Are numb. ? Are swollen. ? Are painful. ? Turn white or blue. This information is not intended to replace advice given to you by your health care provider. Make sure you discuss any questions you have with your health care provider. Document Released: 12/21/2007 Document Revised: 02/28/2016 Document Reviewed: 10/27/2014 Elsevier Interactive Patient Education  2018 [employer]  

## 2018-01-24 ENCOUNTER — Other Ambulatory Visit: Payer: Self-pay | Admitting: Family Medicine

## 2018-01-24 DIAGNOSIS — G8929 Other chronic pain: Secondary | ICD-10-CM

## 2018-01-24 DIAGNOSIS — M25511 Pain in right shoulder: Principal | ICD-10-CM

## 2018-03-29 ENCOUNTER — Other Ambulatory Visit: Payer: Self-pay | Admitting: Family Medicine

## 2018-03-29 DIAGNOSIS — M25511 Pain in right shoulder: Principal | ICD-10-CM

## 2018-03-29 DIAGNOSIS — G8929 Other chronic pain: Secondary | ICD-10-CM

## 2018-04-04 ENCOUNTER — Ambulatory Visit: Payer: Medicaid Other | Admitting: Family Medicine

## 2018-04-13 MED FILL — TAMSULOSIN HCL 0.4 MG CAP: 0.4 | 30 days supply | Qty: 30 | Fill #0

## 2018-04-25 ENCOUNTER — Ambulatory Visit: Payer: Medicaid Other | Admitting: Family Medicine

## 2018-05-17 ENCOUNTER — Ambulatory Visit: Payer: Medicaid Other | Admitting: Family Medicine

## 2018-05-23 ENCOUNTER — Other Ambulatory Visit: Payer: Self-pay | Admitting: Family Medicine

## 2018-05-23 DIAGNOSIS — M25511 Pain in right shoulder: Principal | ICD-10-CM

## 2018-05-23 DIAGNOSIS — G8929 Other chronic pain: Secondary | ICD-10-CM

## 2018-06-02 ENCOUNTER — Other Ambulatory Visit: Payer: Self-pay | Admitting: Family Medicine

## 2018-06-02 DIAGNOSIS — M25511 Pain in right shoulder: Principal | ICD-10-CM

## 2018-06-02 DIAGNOSIS — G8929 Other chronic pain: Secondary | ICD-10-CM

## 2018-07-23 ENCOUNTER — Other Ambulatory Visit: Payer: Self-pay | Admitting: Family Medicine

## 2018-07-23 DIAGNOSIS — M25511 Pain in right shoulder: Principal | ICD-10-CM

## 2018-07-23 DIAGNOSIS — G8929 Other chronic pain: Secondary | ICD-10-CM

## 2018-07-26 ENCOUNTER — Other Ambulatory Visit: Payer: Self-pay | Admitting: Family Medicine

## 2018-07-26 DIAGNOSIS — G8929 Other chronic pain: Secondary | ICD-10-CM

## 2018-07-26 DIAGNOSIS — M25511 Pain in right shoulder: Principal | ICD-10-CM

## 2018-08-15 ENCOUNTER — Ambulatory Visit: Payer: Medicaid Other | Admitting: Family Medicine

## 2018-08-18 HISTORY — PX: TRANSTHORACIC ECHOCARDIOGRAM: SHX275

## 2018-09-06 ENCOUNTER — Emergency Department (HOSPITAL_COMMUNITY): Payer: Medicaid Other

## 2018-09-06 ENCOUNTER — Other Ambulatory Visit: Payer: Self-pay

## 2018-09-06 ENCOUNTER — Inpatient Hospital Stay (HOSPITAL_COMMUNITY)
Admission: EM | Admit: 2018-09-06 | Discharge: 2018-09-07 | DRG: 683 | Disposition: A | Discharge status: Home / Self Care | Payer: Medicaid Other | Attending: Internal Medicine | Admitting: Internal Medicine

## 2018-09-06 ENCOUNTER — Inpatient Hospital Stay (HOSPITAL_COMMUNITY): Payer: Medicaid Other

## 2018-09-06 ENCOUNTER — Encounter (HOSPITAL_COMMUNITY): Payer: Self-pay | Admitting: Internal Medicine

## 2018-09-06 DIAGNOSIS — N183 Chronic kidney disease, stage 3 unspecified: Secondary | ICD-10-CM | POA: Diagnosis present

## 2018-09-06 DIAGNOSIS — R55 Syncope and collapse: Secondary | ICD-10-CM

## 2018-09-06 DIAGNOSIS — I420 Dilated cardiomyopathy: Secondary | ICD-10-CM | POA: Diagnosis present

## 2018-09-06 DIAGNOSIS — M47812 Spondylosis without myelopathy or radiculopathy, cervical region: Secondary | ICD-10-CM | POA: Diagnosis present

## 2018-09-06 DIAGNOSIS — D7589 Other specified diseases of blood and blood-forming organs: Secondary | ICD-10-CM | POA: Diagnosis present

## 2018-09-06 DIAGNOSIS — I4510 Unspecified right bundle-branch block: Secondary | ICD-10-CM | POA: Diagnosis present

## 2018-09-06 DIAGNOSIS — Z87891 Personal history of nicotine dependence: Secondary | ICD-10-CM | POA: Diagnosis not present

## 2018-09-06 DIAGNOSIS — N179 Acute kidney failure, unspecified: Secondary | ICD-10-CM | POA: Diagnosis present

## 2018-09-06 DIAGNOSIS — I959 Hypotension, unspecified: Secondary | ICD-10-CM

## 2018-09-06 DIAGNOSIS — I130 Hypertensive heart and chronic kidney disease with heart failure and stage 1 through stage 4 chronic kidney disease, or unspecified chronic kidney disease: Secondary | ICD-10-CM | POA: Diagnosis present

## 2018-09-06 DIAGNOSIS — F1910 Other psychoactive substance abuse, uncomplicated: Secondary | ICD-10-CM | POA: Diagnosis present

## 2018-09-06 DIAGNOSIS — R778 Other specified abnormalities of plasma proteins: Secondary | ICD-10-CM | POA: Diagnosis present

## 2018-09-06 DIAGNOSIS — I951 Orthostatic hypotension: Secondary | ICD-10-CM | POA: Diagnosis present

## 2018-09-06 DIAGNOSIS — I252 Old myocardial infarction: Secondary | ICD-10-CM

## 2018-09-06 DIAGNOSIS — E861 Hypovolemia: Secondary | ICD-10-CM | POA: Diagnosis present

## 2018-09-06 DIAGNOSIS — Z888 Allergy status to other drugs, medicaments and biological substances status: Secondary | ICD-10-CM | POA: Diagnosis not present

## 2018-09-06 DIAGNOSIS — Z7982 Long term (current) use of aspirin: Secondary | ICD-10-CM

## 2018-09-06 DIAGNOSIS — E785 Hyperlipidemia, unspecified: Secondary | ICD-10-CM | POA: Diagnosis present

## 2018-09-06 DIAGNOSIS — Z9119 Patient's noncompliance with other medical treatment and regimen: Secondary | ICD-10-CM

## 2018-09-06 DIAGNOSIS — I255 Ischemic cardiomyopathy: Secondary | ICD-10-CM | POA: Diagnosis present

## 2018-09-06 DIAGNOSIS — I2510 Atherosclerotic heart disease of native coronary artery without angina pectoris: Secondary | ICD-10-CM | POA: Diagnosis present

## 2018-09-06 DIAGNOSIS — F1411 Cocaine abuse, in remission: Secondary | ICD-10-CM | POA: Diagnosis present

## 2018-09-06 DIAGNOSIS — E860 Dehydration: Secondary | ICD-10-CM | POA: Diagnosis present

## 2018-09-06 DIAGNOSIS — N184 Chronic kidney disease, stage 4 (severe): Secondary | ICD-10-CM | POA: Diagnosis present

## 2018-09-06 DIAGNOSIS — Z79899 Other long term (current) drug therapy: Secondary | ICD-10-CM | POA: Diagnosis not present

## 2018-09-06 DIAGNOSIS — I5042 Chronic combined systolic (congestive) and diastolic (congestive) heart failure: Secondary | ICD-10-CM | POA: Diagnosis present

## 2018-09-06 DIAGNOSIS — F1011 Alcohol abuse, in remission: Secondary | ICD-10-CM | POA: Diagnosis present

## 2018-09-06 DIAGNOSIS — R7989 Other specified abnormal findings of blood chemistry: Secondary | ICD-10-CM

## 2018-09-06 LAB — URINALYSIS, ROUTINE W REFLEX MICROSCOPIC
BACTERIA UA: NONE SEEN
BILIRUBIN URINE: NEGATIVE
Glucose, UA: NEGATIVE mg/dL
Hgb urine dipstick: NEGATIVE
KETONES UR: NEGATIVE mg/dL
Nitrite: NEGATIVE
PH: 5 (ref 5.0–8.0)
PROTEIN: NEGATIVE mg/dL
Specific Gravity, Urine: 1.009 (ref 1.005–1.030)

## 2018-09-06 LAB — BASIC METABOLIC PANEL
Anion gap: 11 (ref 5–15)
Anion gap: 8 (ref 5–15)
BUN: 44 mg/dL — AB (ref 8–23)
BUN: 42 mg/dL — ABNORMAL HIGH (ref 8–23)
CHLORIDE: 106 mmol/L (ref 98–111)
CO2: 20 mmol/L — AB (ref 22–32)
CO2: 22 mmol/L (ref 22–32)
CREATININE: 3.98 mg/dL — AB (ref 0.61–1.24)
Calcium: 8.7 mg/dL — ABNORMAL LOW (ref 8.9–10.3)
Calcium: 8.5 mg/dL — ABNORMAL LOW (ref 8.9–10.3)
Chloride: 108 mmol/L (ref 98–111)
Creatinine, Ser: 3.44 mg/dL — ABNORMAL HIGH (ref 0.61–1.24)
GFR calc Af Amer: 17 mL/min — ABNORMAL LOW (ref >60)
GFR calc Af Amer: 21 mL/min — ABNORMAL LOW (ref >60)
GFR calc non Af Amer: 15 mL/min — ABNORMAL LOW (ref >60)
GFR calc non Af Amer: 18 mL/min — ABNORMAL LOW (ref >60)
GLUCOSE: 125 mg/dL — AB (ref 70–99)
Glucose, Bld: 104 mg/dL — ABNORMAL HIGH (ref 70–99)
POTASSIUM: 4.9 mmol/L (ref 3.5–5.1)
Potassium: 5.2 mmol/L — ABNORMAL HIGH (ref 3.5–5.1)
Sodium: 137 mmol/L (ref 135–145)
Sodium: 138 mmol/L (ref 135–145)

## 2018-09-06 LAB — CBC WITH DIFFERENTIAL/PLATELET
Abs Immature Granulocytes: 0.03 10*3/uL (ref 0.00–0.07)
BASOS PCT: 1 %
Basophils Absolute: 0 10*3/uL (ref 0.0–0.1)
EOS ABS: 0.1 10*3/uL (ref 0.0–0.5)
EOS PCT: 2 %
HEMATOCRIT: 42 % (ref 39.0–52.0)
Hemoglobin: 13.8 g/dL (ref 13.0–17.0)
Immature Granulocytes: 1 %
LYMPHS ABS: 1.6 10*3/uL (ref 0.7–4.0)
Lymphocytes Relative: 30 %
MCH: 35.5 pg — ABNORMAL HIGH (ref 26.0–34.0)
MCHC: 32.9 g/dL (ref 30.0–36.0)
MCV: 108 fL — AB (ref 80.0–100.0)
Monocytes Absolute: 0.6 10*3/uL (ref 0.1–1.0)
Monocytes Relative: 12 %
NRBC: 0 % (ref 0.0–0.2)
Neutro Abs: 3 10*3/uL (ref 1.7–7.7)
Neutrophils Relative %: 54 %
PLATELETS: 165 10*3/uL (ref 150–400)
RBC: 3.89 MIL/uL — ABNORMAL LOW (ref 4.22–5.81)
RDW: 12.1 % (ref 11.5–15.5)
WBC: 5.4 10*3/uL (ref 4.0–10.5)

## 2018-09-06 LAB — ECHOCARDIOGRAM COMPLETE: Weight: 2688 oz

## 2018-09-06 LAB — DIGOXIN LEVEL: Digoxin Level: 0.7 ng/mL — ABNORMAL LOW (ref 0.8–2.0)

## 2018-09-06 LAB — TROPONIN I
TROPONIN I: 0.11 ng/mL — AB (ref <0.03)
Troponin I: 0.18 ng/mL (ref <0.03)
Troponin I: 0.08 ng/mL (ref <0.03)
Troponin I: 0.07 ng/mL (ref <0.03)

## 2018-09-06 LAB — MRSA PCR SCREENING: MRSA BY PCR: NEGATIVE

## 2018-09-06 MED ORDER — SODIUM CHLORIDE 0.9 % IV SOLN
INTRAVENOUS | Status: DC
  Administered 2018-09-06 (×2): via INTRAVENOUS

## 2018-09-06 MED ORDER — HEPARIN SODIUM (PORCINE) 5000 UNIT/ML IJ SOLN
5000.0000 [IU] | Freq: Three times a day (TID) | INTRAMUSCULAR | Status: DC
  Administered 2018-09-06 – 2018-09-07 (×3): 5000 [IU] via SUBCUTANEOUS
  Filled 2018-09-06 (×3): qty 1

## 2018-09-06 MED ORDER — ASPIRIN EC 81 MG PO TBEC
81.0000 mg | DELAYED_RELEASE_TABLET | Freq: Every day | ORAL | Status: DC
  Administered 2018-09-06 – 2018-09-07 (×2): 81 mg via ORAL
  Filled 2018-09-06 (×2): qty 1

## 2018-09-06 MED ORDER — CARVEDILOL 6.25 MG PO TABS
6.2500 mg | ORAL_TABLET | Freq: Two times a day (BID) | ORAL | Status: DC
  Administered 2018-09-06 – 2018-09-07 (×2): 6.25 mg via ORAL
  Filled 2018-09-06 (×3): qty 1

## 2018-09-06 MED ORDER — DIGOXIN 125 MCG PO TABS
0.1250 mg | ORAL_TABLET | Freq: Every day | ORAL | Status: DC
  Administered 2018-09-06 – 2018-09-07 (×2): 0.125 mg via ORAL
  Filled 2018-09-06 (×2): qty 1

## 2018-09-06 MED ORDER — SODIUM CHLORIDE 0.9 % IV BOLUS
1000.0000 mL | Freq: Once | INTRAVENOUS | Status: AC
  Administered 2018-09-06: 1000 mL via INTRAVENOUS

## 2018-09-06 MED ORDER — ACETAMINOPHEN 325 MG PO TABS: 650.0000 mg | ORAL_TABLET | Freq: Four times a day (QID) | ORAL | Status: DC | PRN

## 2018-09-06 MED ORDER — ALLOPURINOL 300 MG PO TABS
150.0000 mg | ORAL_TABLET | Freq: Every day | ORAL | Status: DC
  Administered 2018-09-07: 150 mg via ORAL
  Filled 2018-09-06: qty 1

## 2018-09-06 MED ORDER — SODIUM CHLORIDE 0.9% FLUSH
3.0000 mL | Freq: Once | INTRAVENOUS | Status: AC
  Administered 2018-09-06: 3 mL via INTRAVENOUS

## 2018-09-06 MED ORDER — ASPIRIN 81 MG PO CHEW
324.0000 mg | CHEWABLE_TABLET | Freq: Once | ORAL | Status: AC
  Administered 2018-09-06: 324 mg via ORAL
  Filled 2018-09-06: qty 4

## 2018-09-06 NOTE — H&P (Signed)
History and Physical    [name] MKL:[id number] DOB: [birth year] DOA: 09/06/2018  PCP: Charlott Rakes, MD   Patient coming from: home   I have personally briefly reviewed patient's old medical records available.   Chief Complaint: "I passed out and hurt my neck"  HPI: [name] is a 63 y.o. male with medical history significant of coronary artery disease, ischemic cardiomyopathy with ejection fraction 25%, chronic kidney disease stage III with a baseline creatinine about 1.6, crack cocaine use, hypertension and previous episodes of hypotension with syncope presenting to the emergency room with passing out spells, hitting his neck at home.  According to the patient, he follows up with cardiology, he has been consistently taking his medications including Lasix and Entresto.  Patient denies any weight gain.  Patient is stated that he had occasional shortness of breath and chest pain on exertion that will relieved with nitroglycerin.  He did not feel quite well since Sunday and  some shortness of breath.  Patient generally felt weak for last 2 days.  Last night he got up, walked to the kitchen, he was feeling somehow lightheaded and wobbly, he blacked out and hit his neck onto the wall, woke up immediately with pain in his neck.  Describes pain as both side of the neck, about 6 out of 10 and also has some headache.  Denies any chest pain.  Denies any flulike symptoms.  Denies any cough cold.  Had some nausea otherwise did not have any persistent vomiting or diarrhea.  Stated good appetite.  He was able to yell at his neighbor who was walking in the passage and they called EMS for him. ED Course: Hypotensive on arrival with systolic blood pressure 83, heart rate 47, orthostatic positive.  Creatinine 3.98.  Potassium 5.2.  Troponin 0 0.18.  EKG with some nonspecific ST changes.  Patient with no active chest pain.  Clinically dehydrated.  2 L fluid boluses were given in the ER with some improvement  in symptoms.  Due to significant symptoms, will need hospitalization and treatment. CT head and neck negative for trauma.  Review of Systems: As per HPI otherwise 10 point review of systems negative.    Past Medical History:  Diagnosis Date  . Anxiety   . Arthritis    "all over" (01/07/2015)  . CKD (chronic kidney disease), stage IV ([place])   . Depression   . Headache    "probably weekly" (01/07/2015)  . Heart attack ([place]) 07/2014  . History of echocardiogram 12/2014   EF 20-25%  . Homelessness   . Hypercholesterolemia   . Hypertension   . Ischemic dilated cardiomyopathy ([place])   . Noncompliance   . NSTEMI (non-ST elevated myocardial infarction) ([place]) 01/07/2015  . Polysubstance abuse (HCC)    etoh, cocaine  . Urinary hesitancy   . Walking pneumonia 07/2014    Past Surgical History:  Procedure Laterality Date  . CARDIAC CATHETERIZATION    . LEFT HEART CATHETERIZATION WITH CORONARY ANGIOGRAM N/A 08/15/2014   Procedure: LEFT HEART CATHETERIZATION WITH CORONARY ANGIOGRAM;  Surgeon: Clent Demark, MD;  Location: [facility] CATH LAB;  Service: Cardiovascular;  Laterality: N/A;     reports that he quit smoking about 14 years ago. His smoking use included cigarettes. He has a 45.00 pack-year smoking history. He has quit using smokeless tobacco. He reports current alcohol use of about 2.0 - 4.0 standard drinks of alcohol per week. He reports that he does not use drugs.  Allergies  Allergen Reactions  .  Tape Rash    History reviewed. No pertinent family history.   Prior to Admission medications   Medication Sig Start Date End Date Taking? Authorizing Provider  allopurinol (ZYLOPRIM) 300 MG tablet Take 0.5 tablets (150 mg total) by mouth daily. 11/15/16  Yes Charlott Rakes, MD  aspirin 81 MG EC tablet Take 1 tablet (81 mg total) by mouth daily. 01/06/17  Yes Charlott Rakes, MD  b complex vitamins tablet Take 1 tablet by mouth daily.   Yes [provider]  carvedilol (COREG) 6.25 MG  tablet Take 6.25 mg by mouth 2 (two) times daily. 05/03/18  Yes [provider]  digoxin (LANOXIN) 0.125 MG tablet Take 1 tablet (0.125 mg total) by mouth daily. 08/05/16  Yes Charolette Forward, MD  furosemide (LASIX) 80 MG tablet Take 2 tablets (160 mg total) by mouth 2 (two) times daily. 02/27/17  Yes Daleen Bo, MD  gabapentin (NEURONTIN) 300 MG capsule Take 2 capsules (600 mg total) by mouth 2 (two) times daily. 07/23/18  Yes Charlott Rakes, MD  nitroGLYCERIN (NITROSTAT) 0.4 MG SL tablet Place 1 tablet (0.4 mg total) under the tongue every 5 (five) minutes x 3 doses as needed for chest pain. 01/06/17  Yes Charlott Rakes, MD  potassium chloride SA (K-DUR,KLOR-CON) 20 MEQ tablet Take 20 mEq by mouth daily. 06/04/16  Yes [provider]  sacubitril-valsartan (ENTRESTO) 24-26 MG Take 1 tablet by mouth 2 (two) times daily. 08/04/16  Yes Charolette Forward, MD  tiZANidine (ZANAFLEX) 4 MG tablet Take 1 tablet (4 mg total) by mouth every 6 (six) hours as needed for muscle spasms. 07/30/18  Yes Charlott Rakes, MD    Physical Exam: Vitals:   09/06/18 0600 09/06/18 0630 09/06/18 0700 09/06/18 0730  BP: 108/68 123/65 116/75 124/84  Pulse: (!) 53 (!) 43 (!) 43 (!) 47  Resp: 15 15 (!) 9 14  Temp:      TempSrc:      SpO2: 97% 100% 99% 100%  Weight:        Constitutional: NAD, calm, comfortable Vitals:   09/06/18 0600 09/06/18 0630 09/06/18 0700 09/06/18 0730  BP: 108/68 123/65 116/75 124/84  Pulse: (!) 53 (!) 43 (!) 43 (!) 47  Resp: 15 15 (!) 9 14  Temp:      TempSrc:      SpO2: 97% 100% 99% 100%  Weight:       Eyes: PERRL, lids and conjunctivae normal ENMT: Mucous membranes are moist. Posterior pharynx clear of any exudate or lesions.Normal dentition.  Neck: normal, supple, no masses, no thyromegaly Respiratory: clear to auscultation bilaterally, no wheezing, no crackles. Normal respiratory effort. No accessory muscle use.  Cardiovascular: Regular rate and rhythm, no murmurs /  rubs / gallops. No extremity edema. 2+ pedal pulses. No carotid bruits.  Abdomen: no tenderness, no masses palpated. No hepatosplenomegaly. Bowel sounds positive.  Musculoskeletal: no clubbing / cyanosis. No joint deformity upper and lower extremities. Good ROM, no contractures. Normal muscle tone.  Skin: no rashes, lesions, ulcers. No induration Neurologic: CN 2-12 grossly intact. Sensation intact, DTR normal. Strength 5/5 in all 4.  Psychiatric: Normal judgment and insight. Alert and oriented x 3. Normal mood.     Labs on Admission: I have personally reviewed following labs and imaging studies  CBC: Recent Labs  Lab 09/06/18 0209  WBC 5.4  NEUTROABS 3.0  HGB 13.8  HCT 42.0  MCV 108.0*  PLT 595   Basic Metabolic Panel: Recent Labs  Lab 09/06/18 0205  NA 137  K 5.2*  CL 106  CO2 20*  GLUCOSE 125*  BUN 44*  CREATININE 3.98*  CALCIUM 8.7*   GFR: CrCl cannot be calculated (Unknown ideal weight.). Liver Function Tests: No results for input(s): AST, ALT, ALKPHOS, BILITOT, PROT, ALBUMIN in the last 168 hours. No results for input(s): LIPASE, AMYLASE in the last 168 hours. No results for input(s): AMMONIA in the last 168 hours. Coagulation Profile: No results for input(s): INR, PROTIME in the last 168 hours. Cardiac Enzymes: Recent Labs  Lab 09/06/18 0209  TROPONINI 0.18*   BNP (last 3 results) No results for input(s): PROBNP in the last 8760 hours. HbA1C: No results for input(s): HGBA1C in the last 72 hours. CBG: No results for input(s): GLUCAP in the last 168 hours. Lipid Profile: No results for input(s): CHOL, HDL, LDLCALC, TRIG, CHOLHDL, LDLDIRECT in the last 72 hours. Thyroid Function Tests: No results for input(s): TSH, T4TOTAL, FREET4, T3FREE, THYROIDAB in the last 72 hours. Anemia Panel: No results for input(s): VITAMINB12, FOLATE, FERRITIN, TIBC, IRON, RETICCTPCT in the last 72 hours. Urine analysis:    Component Value Date/Time   COLORURINE YELLOW  09/06/2018 0458   APPEARANCEUR CLEAR 09/06/2018 0458   LABSPEC 1.009 09/06/2018 0458   PHURINE 5.0 09/06/2018 0458   GLUCOSEU NEGATIVE 09/06/2018 0458   HGBUR NEGATIVE 09/06/2018 0458   BILIRUBINUR NEGATIVE 09/06/2018 0458   KETONESUR NEGATIVE 09/06/2018 0458   PROTEINUR NEGATIVE 09/06/2018 0458   UROBILINOGEN 1.0 05/13/2015 1854   NITRITE NEGATIVE 09/06/2018 0458   LEUKOCYTESUR TRACE (A) 09/06/2018 0458    Radiological Exams on Admission: Dg Chest 1 View  Result Date: 09/06/2018 CLINICAL DATA:  Weakness EXAM: CHEST  1 VIEW COMPARISON:  12/18/2017 FINDINGS: The heart size and mediastinal contours are within normal limits. Both lungs are clear. Mild aortic atherosclerosis. IMPRESSION: No active disease. Electronically Signed   By: Donavan Foil M.D.   On: 09/06/2018 03:09   Ct Head Wo Contrast  Result Date: 09/06/2018 CLINICAL DATA:  63 year old male with syncope, blunt trauma. EXAM: CT HEAD WITHOUT CONTRAST CT CERVICAL SPINE WITHOUT CONTRAST TECHNIQUE: Multidetector CT imaging of the head and cervical spine was performed following the standard protocol without intravenous contrast. Multiplanar CT image reconstructions of the cervical spine were also generated. COMPARISON:  Neck CTs 05/03/2015 and earlier. FINDINGS: CT HEAD FINDINGS Brain: No midline shift, ventriculomegaly, mass effect, evidence of mass lesion, intracranial hemorrhage or evidence of cortically based acute infarction. Gray-white matter differentiation is within normal limits throughout the brain. Vascular: Mild Calcified atherosclerosis at the skull base. No suspicious intracranial vascular hyperdensity. Skull: Intact. Sinuses/Orbits: Visualized paranasal sinuses and mastoids are stable and well pneumatized. Other: Negative orbits. No definite acute scalp finding. Multifocal scalp soft tissue scarring suspected. CT CERVICAL SPINE FINDINGS Alignment: Chronic straightening of cervical lordosis, mild reversal. Cervicothoracic  junction alignment is within normal limits. Bilateral posterior element alignment is within normal limits. Skull base and vertebrae: Visualized skull base is intact. No atlanto-occipital dissociation. No acute osseous abnormality identified. Soft tissues and spinal canal: No prevertebral fluid or swelling. No visible canal hematoma. Partially retropharyngeal course of the left carotid, normal variant. Disc levels: Progressed upper cervical facet arthropathy on the left at C2-C3 and C3-C4 since 2016. Chronic cervical disc bulging and endplate spurring appears stable. Upper chest: Visible upper thoracic levels appear intact. Negative lung apices. IMPRESSION: 1. No acute traumatic injury identified in the head or cervical spine. 2. Normal for age non contrast CT appearance of the brain. 3. Progressed C2-C3 and C3-C4  left facet degeneration since 2016. Electronically Signed   By: Genevie Ann M.D.   On: 09/06/2018 03:06   Ct Cervical Spine Wo Contrast  Result Date: 09/06/2018 CLINICAL DATA:  63 year old male with syncope, blunt trauma. EXAM: CT HEAD WITHOUT CONTRAST CT CERVICAL SPINE WITHOUT CONTRAST TECHNIQUE: Multidetector CT imaging of the head and cervical spine was performed following the standard protocol without intravenous contrast. Multiplanar CT image reconstructions of the cervical spine were also generated. COMPARISON:  Neck CTs 05/03/2015 and earlier. FINDINGS: CT HEAD FINDINGS Brain: No midline shift, ventriculomegaly, mass effect, evidence of mass lesion, intracranial hemorrhage or evidence of cortically based acute infarction. Gray-white matter differentiation is within normal limits throughout the brain. Vascular: Mild Calcified atherosclerosis at the skull base. No suspicious intracranial vascular hyperdensity. Skull: Intact. Sinuses/Orbits: Visualized paranasal sinuses and mastoids are stable and well pneumatized. Other: Negative orbits. No definite acute scalp finding. Multifocal scalp soft tissue  scarring suspected. CT CERVICAL SPINE FINDINGS Alignment: Chronic straightening of cervical lordosis, mild reversal. Cervicothoracic junction alignment is within normal limits. Bilateral posterior element alignment is within normal limits. Skull base and vertebrae: Visualized skull base is intact. No atlanto-occipital dissociation. No acute osseous abnormality identified. Soft tissues and spinal canal: No prevertebral fluid or swelling. No visible canal hematoma. Partially retropharyngeal course of the left carotid, normal variant. Disc levels: Progressed upper cervical facet arthropathy on the left at C2-C3 and C3-C4 since 2016. Chronic cervical disc bulging and endplate spurring appears stable. Upper chest: Visible upper thoracic levels appear intact. Negative lung apices. IMPRESSION: 1. No acute traumatic injury identified in the head or cervical spine. 2. Normal for age non contrast CT appearance of the brain. 3. Progressed C2-C3 and C3-C4 left facet degeneration since 2016. Electronically Signed   By: Genevie Ann M.D.   On: 09/06/2018 03:06    EKG: Independently reviewed.  Sinus rhythm.  Profound right bundle branch block associated with left anterior fascicular block.  Mostly present on previous EKG from a year ago.  Has some new T wave inversions on anterolateral leads.  Assessment/Plan Principal Problem:   Syncope and collapse Active Problems:   Hypotensive episode   CKD (chronic kidney disease) stage 3, GFR 30-59 ml/min (HCC)   Chronic combined systolic and diastolic congestive heart failure (HCC)   History of non-ST elevation myocardial infarction (NSTEMI)   Polysubstance abuse (HCC)   Renal failure (ARF), acute on chronic (HCC)   Troponin level elevated     1.  Syncope and collapse: Suspect orthostatic hypotension with ongoing use of diuretics and Entresto. Clinically dehydrated.  Received 2 L of normal saline in the ER.  Will continue on low-dose maintenance IV fluids.  Check orthostatic  blood pressures and ambulate with caution.  Hold Entresto and diuretics today. Patient with ejection fraction of 25%, currently clinically dehydrated and demands IV fluids. We will keep patient on telemetry, recheck echocardiogram, needs to rule out arrhythmias.  2.  Acute renal failure with chronic kidney disease stage III: Suspect with clinical dehydration and ongoing use of diuretics and Entresto.  Baseline creatinine 1.6 known to our records. Hydrate and monitor levels.  Hold Entresto and diuretics today. We will recheck BMP now to evaluate whether patient needs more fluid.  3.  Chronic combined heart failure with ischemic heart disease:  Recheck echocardiogram today.  Clinically dehydrated.  Will need to resume heart failure regimen once patient is resuscitated and blood pressure improved. Will resume digoxin and beta-blockers with holding parameters. We will check digoxin level.  4.  Elevated troponins: Suspect type II non-STEMI.  Resume aspirin from home.  Currently no chest pain.  We will continue to monitor, recheck troponins to ensure stabilization, recheck EKG in the morning.  Cardiology consulted from ER, will follow recommendations.  5.  Polysubstance abuse and noncompliance: Patient is stated using crack cocaine 3 weeks ago.  He says he is very compliant to medications nowadays.  I counseled him against illicit drug use with his level of heart disease.    DVT prophylaxis: Heparin subcu. Code Status: Full code. Family Communication: Patient with decision-making capacity. Disposition Plan: Home. Consults called: Cardiology. Admission status: Inpatient.   Barb Merino MD [facility] Pager [phone]  If 7PM-7AM, please contact night-coverage [email or web address] Password [facility]  09/06/2018, 7:54 AM

## 2018-09-06 NOTE — Progress Notes (Signed)
  Echocardiogram 2D Echocardiogram has been performed.  [name] 09/06/2018, 11:27 AM

## 2018-09-06 NOTE — ED Notes (Signed)
Orthostatic VS  Lying  BP 90/58  HR 60 Sitting  BP 90/61  HR 65 Standing  BP 79/64  HR 81  Patient c/o weakness when standing. States he took a muscle relaxer, benadryl and this heart pill before going to bed.

## 2018-09-06 NOTE — ED Notes (Signed)
Admitting doctor paged to make aware of pts HR in the 23s. Hr dropping to as low as 41. Pt alert, no complaints at this time

## 2018-09-06 NOTE — ED Provider Notes (Addendum)
[facility]ERGENCY DEPARTMENT Provider Note   CSN: [id number] Arrival date & time: 09/06/18  0156    History   Chief Complaint Chief Complaint  Patient presents with  . Loss of Consciousness    HPI [name] is a 63 y.o. male.  The history is provided by the patient.  Loss of Consciousness  He has history of hypertension, hyperlipidemia, substance abuse, chronic kidney disease, heart failure, coronary artery disease and comes in following a syncopal episode.  He states that he had nausea and vomiting yesterday and felt generally weak today.  He got up to walk to the kitchen and had to hold onto the wall.  He then passed out with unknown duration of loss of consciousness.  He did hit his head and jarred his neck and is complaining of pain in his neck.  He denies any weakness, numbness, tingling.  He denies chest pain, heaviness, tightness, pressure.  He denies any palpitations.  He denies nausea or vomiting today and denies diaphoresis.  He has had some dyspnea.  Past Medical History:  Diagnosis Date  . Anxiety   . Arthritis    "all over" (01/07/2015)  . CKD (chronic kidney disease), stage IV ([place])   . Depression   . Headache    "probably weekly" (01/07/2015)  . Heart attack ([place]) 07/2014  . History of echocardiogram 12/2014   EF 20-25%  . Homelessness   . Hypercholesterolemia   . Hypertension   . Ischemic dilated cardiomyopathy ([place])   . Noncompliance   . NSTEMI (non-ST elevated myocardial infarction) ([place]) 01/07/2015  . Polysubstance abuse (HCC)    etoh, cocaine  . Urinary hesitancy   . Walking pneumonia 07/2014    Patient Active Problem List   Diagnosis Date Noted  . Neuropathy 03/03/2017  . Lower urinary tract symptoms 01/06/2017  . Syncope 07/31/2016  . Acute on chronic systolic heart failure ([place]) 07/05/2016  . CHF exacerbation ([place]) 07/05/2016  . Macrocytosis without anemia 07/05/2016  . Hypotension 06/14/2016  . Generalized anxiety  disorder 06/13/2016  . Heart failure ([place]) 06/11/2016  . Dehydration 04/29/2016  . Dermatitis 01/27/2016  . Arthralgia 08/25/2015  . Hyperbilirubinemia   . Renal failure (ARF), acute on chronic (HCC)   . Dyspnea   . CKD (chronic kidney disease) stage 3, GFR 30-59 ml/min ([place]) 05/03/2015  . Chronic combined systolic and diastolic congestive heart failure ([place]) 05/03/2015  . Benign essential HTN 05/03/2015  . History of non-ST elevation myocardial infarction (NSTEMI) 05/03/2015  . Abnormal LFTs 05/03/2015  . Polysubstance abuse ([place])   . Noncompliance   . Gout 03/24/2015  . Hypotensive episode 09/03/2014    Past Surgical History:  Procedure Laterality Date  . CARDIAC CATHETERIZATION    . LEFT HEART CATHETERIZATION WITH CORONARY ANGIOGRAM N/A 08/15/2014   Procedure: LEFT HEART CATHETERIZATION WITH CORONARY ANGIOGRAM;  Surgeon: Clent Demark, MD;  Location: [facility] CATH LAB;  Service: Cardiovascular;  Laterality: N/A;        Home Medications    Prior to Admission medications   Medication Sig Start Date End Date Taking? Authorizing Provider  albuterol (PROAIR HFA) 108 (90 Base) MCG/ACT inhaler Inhale 1-2 puffs into the lungs every 6 (six) hours as needed for wheezing or shortness of breath. 01/06/17   Charlott Rakes, MD  allopurinol (ZYLOPRIM) 300 MG tablet Take 0.5 tablets (150 mg total) by mouth daily. 11/15/16   Charlott Rakes, MD  aspirin 81 MG EC tablet Take 1 tablet (81 mg total) by  mouth daily. 01/06/17   Charlott Rakes, MD  atorvastatin (LIPITOR) 80 MG tablet Take 80 mg by mouth daily.    [provider]  b complex vitamins tablet Take 1 tablet by mouth daily.    [provider]  betamethasone valerate (VALISONE) 0.1 % cream APPLY TOPICALLY 2 TIMES DAILY. Patient not taking: Reported on 01/03/2018 11/15/16   Charlott Rakes, MD  carvedilol (COREG) 6.25 MG tablet Take 6.25 mg by mouth 2 (two) times daily. 05/03/18   [provider]  cetirizine (ZYRTEC) 10  MG tablet Take 1 tablet (10 mg total) by mouth daily. Patient not taking: Reported on 01/03/2018 05/16/17   Tresa Garter, MD  clopidogrel (PLAVIX) 75 MG tablet Take 75 mg by mouth daily. 11/08/16   [provider]  clotrimazole (LOTRIMIN) 1 % cream Apply 1 application topically 2 (two) times daily. Patient not taking: Reported on 01/03/2018 01/06/17   Charlott Rakes, MD  digoxin (LANOXIN) 0.125 MG tablet Take 1 tablet (0.125 mg total) by mouth daily. 08/05/16   Charolette Forward, MD  furosemide (LASIX) 80 MG tablet Take 2 tablets (160 mg total) by mouth 2 (two) times daily. 02/27/17   Daleen Bo, MD  gabapentin (NEURONTIN) 300 MG capsule Take 2 capsules (600 mg total) by mouth 2 (two) times daily. 07/23/18   Charlott Rakes, MD  ivabradine (CORLANOR) 5 MG TABS tablet Take 5 mg by mouth 2 (two) times daily with a meal.    [provider]  Multiple Vitamin (MULTIVITAMIN WITH MINERALS) TABS tablet Take 1 tablet by mouth daily.    [provider]  nitroGLYCERIN (NITROSTAT) 0.4 MG SL tablet Place 1 tablet (0.4 mg total) under the tongue every 5 (five) minutes x 3 doses as needed for chest pain. Patient not taking: Reported on 01/03/2018 01/06/17   Charlott Rakes, MD  polyethylene glycol [facility]) packet Take 17 g by mouth daily. Patient not taking: Reported on 01/03/2018 01/06/17   Charlott Rakes, MD  potassium chloride SA (K-DUR,KLOR-CON) 20 MEQ tablet Take 20 mEq by mouth daily. 06/04/16   [provider]  predniSONE (STERAPRED UNI-PAK 21 TAB) 10 MG (21) TBPK tablet Take by mouth daily. Take 6 tabs by mouth daily  for 2 days, then 5 tabs for 2 days, then 4 tabs for 2 days, then 3 tabs for 2 days, 2 tabs for 2 days, then 1 tab by mouth daily for 2 days Patient not taking: Reported on 01/03/2018 07/02/17   Pattricia Boss, MD  ranitidine (ZANTAC) 150 MG tablet Take 1 tablet (150 mg total) by mouth 2 (two) times daily. 02/22/17   Charlott Rakes, MD  sacubitril-valsartan  (ENTRESTO) 24-26 MG Take 1 tablet by mouth 2 (two) times daily. 08/04/16   Charolette Forward, MD  tamsulosin (FLOMAX) 0.4 MG CAPS capsule Take 1 capsule (0.4 mg total) by mouth daily. Patient not taking: Reported on 01/03/2018 09/01/17   Charlott Rakes, MD  tiZANidine (ZANAFLEX) 4 MG tablet Take 1 tablet (4 mg total) by mouth every 6 (six) hours as needed for muscle spasms. 07/30/18   Charlott Rakes, MD  triamcinolone cream (KENALOG) 0.1 % Apply 1 application topically 2 (two) times daily. Patient not taking: Reported on 01/03/2018 12/18/17   Recardo Evangelist, PA-C    Family History No family history on file.  Social History Social History   Tobacco Use  . Smoking status: Former Smoker    Packs/day: 1.50    Years: 30.00    Pack years: 45.00  Types: Cigarettes    Last attempt to quit: 02/19/2004    Years since quitting: 14.5  . Smokeless tobacco: Former Systems developer  . Tobacco comment: quit 2005  Substance Use Topics  . Alcohol use: Yes    Alcohol/week: 2.0 - 4.0 standard drinks    Types: 2 - 4 Cans of beer per week    Comment: once weekly  . Drug use: No    Comment: former     Allergies   Tape   Review of Systems Review of Systems  Cardiovascular: Positive for syncope.  All other systems reviewed and are negative.    Physical Exam Updated Vital Signs BP (!) 83/69   Pulse (!) 55   Temp 97.7 F (36.5 C) (Oral)   Resp 15   Wt 76.2 kg   SpO2 100%   BMI 25.54 kg/m   Physical Exam Vitals signs and nursing note reviewed.    63 year old male, resting comfortably and in no acute distress. Vital signs are significant for low blood pressure and heart rate. Oxygen saturation is 100%, which is normal. Head is normocephalic and atraumatic. PERRLA, EOMI. Oropharynx is clear. Neck is immobilized in a stiff cervical collar.  There is mild to moderate tenderness diffusely.  There is no adenopathy or JVD. Back is nontender and there is no CVA tenderness. Lungs are clear without rales,  wheezes, or rhonchi. Chest is nontender. Heart has regular rate and rhythm without murmur. Abdomen is soft, flat, nontender without masses or hepatosplenomegaly and peristalsis is normoactive. Extremities have no cyanosis or edema, full range of motion is present. Skin is warm and dry without rash. Neurologic: Mental status is normal, cranial nerves are intact, there are no motor or sensory deficits.  ED Treatments / Results  Labs (all labs ordered are listed, but only abnormal results are displayed) Labs Reviewed  BASIC METABOLIC PANEL - Abnormal; Notable for the following components:      Result Value   Potassium 5.2 (*)    CO2 20 (*)    Glucose, Bld 125 (*)    BUN 44 (*)    Creatinine, Ser 3.98 (*)    Calcium 8.7 (*)    GFR calc non Af Amer 15 (*)    GFR calc Af Amer 17 (*)    All other components within normal limits  CBC WITH DIFFERENTIAL/PLATELET - Abnormal; Notable for the following components:   RBC 3.89 (*)    MCV 108.0 (*)    MCH 35.5 (*)    All other components within normal limits  TROPONIN I - Abnormal; Notable for the following components:   Troponin I 0.18 (*)    All other components within normal limits  URINALYSIS, ROUTINE W REFLEX MICROSCOPIC    EKG EKG Interpretation  Date/Time:  Thursday September 06 2018 02:02:44 EST Ventricular Rate:  62 PR Interval:    QRS Duration: 183 QT Interval:  550 QTC Calculation: 559 R Axis:   -102 Text Interpretation:  Sinus rhythm Prolonged PR interval RBBB and LAFB Repol abnrm, prob ischemia, anterolateral lds When compared with ECG of 12/18/2017, T wave inversion is now present Inferior leads and  Anterolateral leads Confirmed by Delora Fuel ([id number]) on 09/06/2018 2:06:24 AM   Radiology Dg Chest 1 View  Result Date: 09/06/2018 CLINICAL DATA:  Weakness EXAM: CHEST  1 VIEW COMPARISON:  12/18/2017 FINDINGS: The heart size and mediastinal contours are within normal limits. Both lungs are clear. Mild aortic atherosclerosis.  IMPRESSION: No active disease. Electronically Signed  By: Donavan Foil M.D.   On: 09/06/2018 03:09   Ct Head Wo Contrast  Result Date: 09/06/2018 CLINICAL DATA:  63 year old male with syncope, blunt trauma. EXAM: CT HEAD WITHOUT CONTRAST CT CERVICAL SPINE WITHOUT CONTRAST TECHNIQUE: Multidetector CT imaging of the head and cervical spine was performed following the standard protocol without intravenous contrast. Multiplanar CT image reconstructions of the cervical spine were also generated. COMPARISON:  Neck CTs 05/03/2015 and earlier. FINDINGS: CT HEAD FINDINGS Brain: No midline shift, ventriculomegaly, mass effect, evidence of mass lesion, intracranial hemorrhage or evidence of cortically based acute infarction. Gray-white matter differentiation is within normal limits throughout the brain. Vascular: Mild Calcified atherosclerosis at the skull base. No suspicious intracranial vascular hyperdensity. Skull: Intact. Sinuses/Orbits: Visualized paranasal sinuses and mastoids are stable and well pneumatized. Other: Negative orbits. No definite acute scalp finding. Multifocal scalp soft tissue scarring suspected. CT CERVICAL SPINE FINDINGS Alignment: Chronic straightening of cervical lordosis, mild reversal. Cervicothoracic junction alignment is within normal limits. Bilateral posterior element alignment is within normal limits. Skull base and vertebrae: Visualized skull base is intact. No atlanto-occipital dissociation. No acute osseous abnormality identified. Soft tissues and spinal canal: No prevertebral fluid or swelling. No visible canal hematoma. Partially retropharyngeal course of the left carotid, normal variant. Disc levels: Progressed upper cervical facet arthropathy on the left at C2-C3 and C3-C4 since 2016. Chronic cervical disc bulging and endplate spurring appears stable. Upper chest: Visible upper thoracic levels appear intact. Negative lung apices. IMPRESSION: 1. No acute traumatic injury identified  in the head or cervical spine. 2. Normal for age non contrast CT appearance of the brain. 3. Progressed C2-C3 and C3-C4 left facet degeneration since 2016. Electronically Signed   By: Genevie Ann M.D.   On: 09/06/2018 03:06   Ct Cervical Spine Wo Contrast  Result Date: 09/06/2018 CLINICAL DATA:  63 year old male with syncope, blunt trauma. EXAM: CT HEAD WITHOUT CONTRAST CT CERVICAL SPINE WITHOUT CONTRAST TECHNIQUE: Multidetector CT imaging of the head and cervical spine was performed following the standard protocol without intravenous contrast. Multiplanar CT image reconstructions of the cervical spine were also generated. COMPARISON:  Neck CTs 05/03/2015 and earlier. FINDINGS: CT HEAD FINDINGS Brain: No midline shift, ventriculomegaly, mass effect, evidence of mass lesion, intracranial hemorrhage or evidence of cortically based acute infarction. Gray-white matter differentiation is within normal limits throughout the brain. Vascular: Mild Calcified atherosclerosis at the skull base. No suspicious intracranial vascular hyperdensity. Skull: Intact. Sinuses/Orbits: Visualized paranasal sinuses and mastoids are stable and well pneumatized. Other: Negative orbits. No definite acute scalp finding. Multifocal scalp soft tissue scarring suspected. CT CERVICAL SPINE FINDINGS Alignment: Chronic straightening of cervical lordosis, mild reversal. Cervicothoracic junction alignment is within normal limits. Bilateral posterior element alignment is within normal limits. Skull base and vertebrae: Visualized skull base is intact. No atlanto-occipital dissociation. No acute osseous abnormality identified. Soft tissues and spinal canal: No prevertebral fluid or swelling. No visible canal hematoma. Partially retropharyngeal course of the left carotid, normal variant. Disc levels: Progressed upper cervical facet arthropathy on the left at C2-C3 and C3-C4 since 2016. Chronic cervical disc bulging and endplate spurring appears stable.  Upper chest: Visible upper thoracic levels appear intact. Negative lung apices. IMPRESSION: 1. No acute traumatic injury identified in the head or cervical spine. 2. Normal for age non contrast CT appearance of the brain. 3. Progressed C2-C3 and C3-C4 left facet degeneration since 2016. Electronically Signed   By: Genevie Ann M.D.   On: 09/06/2018 03:06    Procedures Procedures  CRITICAL CARE Performed by: Delora Fuel Total critical care time: 60 minutes Critical care time was exclusive of separately billable procedures and treating other patients. Critical care was necessary to treat or prevent imminent or life-threatening deterioration. Critical care was time spent personally by me on the following activities: development of treatment plan with patient and/or surrogate as well as nursing, discussions with consultants, evaluation of patient's response to treatment, examination of patient, obtaining history from patient or surrogate, ordering and performing treatments and interventions, ordering and review of laboratory studies, ordering and review of radiographic studies, pulse oximetry and re-evaluation of patient's condition.  Medications Ordered in ED Medications  sodium chloride 0.9 % bolus 1,000 mL (1,000 mLs Intravenous New Bag/Given 09/06/18 0459)  sodium chloride flush (NS) 0.9 % injection 3 mL (3 mLs Intravenous Given 09/06/18 4235)  sodium chloride 0.9 % bolus 1,000 mL (0 mLs Intravenous Stopped 09/06/18 0459)  aspirin chewable tablet 324 mg (324 mg Oral Given 09/06/18 0341)     Initial Impression / Assessment and Plan / ED Course  I have reviewed the triage vital signs and the nursing notes.  Pertinent labs & imaging results that were available during my care of the patient were reviewed by me and considered in my medical decision making (see chart for details).  Syncope.  Head injury and neck injury upon falling.  He is noted to be somewhat hypotensive and is given IV fluids.  He  denies any melena.  Old records are reviewed showing ED visits and hospitalizations for heart failure.  In November 2017, he did have a hospital admission for syncope related to hypotension.  ECG shows new T wave inversions worrisome for ischemia.  Screening labs are obtained including troponin.  He will be sent for CT of head and cervical spine.  CT scans showed no acute injury.  Chest x-ray showed no acute process.  Troponin is mildly elevated and will need to be trended.  Metabolic panel shows acute kidney injury with creatinine 3.98 compared with baseline of 1.82.  BUN is 44 compared with baseline of 20.  CBC shows macrocytosis without anemia.  Renal failure might partly explain elevated troponin.  Blood pressure has not increased in spite of normal saline bolus.  He is given a second bolus of normal saline.  Case is discussed with Dr. Hal Hope of [facility] hospitalist, who agrees to admit the patient.  Final Clinical Impressions(s) / ED Diagnoses   Final diagnoses:  Syncope, unspecified syncope type  Hypotension, unspecified hypotension type  Acute kidney injury (nontraumatic) (HCC)  Macrocytosis without anemia    ED Discharge Orders    None       Delora Fuel, MD 36/14/43 1540    Delora Fuel, MD 08/67/61 [phone]

## 2018-09-06 NOTE — ED Notes (Signed)
Pt ate 100% of lunch tray

## 2018-09-06 NOTE — Consult Note (Signed)
Reason for Consult: Syncope/minimally elevated troponin I Referring Physician: [facility] hospitalist  [name] is an 63 y.o. male.  HPI: 63 year old male with past medical history significant for nonischemic cardiomyopathy history of non-Q wave MI in the setting of cocaine abuse in the past, history of congestive heart failure secondary to systolic dysfunction, hypertension, chronic kidney disease stage II/III, history of EtOH and cocaine abuse, hyperlipidemia, came to ER earlier this morning following syncopal episode.  States while walking to the kitchen last night felt dizzy weak and passed out for a few minutes and hit his head complains of headache and neck pain.  Denies any palpitation or chest pain prior to passing out.  Denies any seizure activity.  Denies any weakness in the arms or legs.  Patient was noted to be hypotensive received IV fluid bolus with improvement in her blood pressure.  EKG showed normal sinus rhythm with right bundle branch block with secondary ST-T wave changes no new acute ischemic changes were noted patient also of note was noted to have minimally elevated troponin I and significantly elevated creatinine of 3.98.  Presently alert awake states has been voiding urine blood pressure has remained stable.  Past Medical History:  Diagnosis Date  . Anxiety   . Arthritis    "all over" (01/07/2015)  . CKD (chronic kidney disease), stage IV ([place])   . Depression   . Headache    "probably weekly" (01/07/2015)  . Heart attack ([facility]) 07/2014  . History of echocardiogram 12/2014   EF 20-25%  . Homelessness   . Hypercholesterolemia   . Hypertension   . Ischemic dilated cardiomyopathy ([facility])   . Noncompliance   . NSTEMI (non-ST elevated myocardial infarction) ([facility]) 01/07/2015  . Polysubstance abuse ([facility])    etoh, cocaine  . Urinary hesitancy   . Walking pneumonia 07/2014    Past Surgical History:  Procedure Laterality Date  . CARDIAC CATHETERIZATION    . LEFT HEART  CATHETERIZATION WITH CORONARY ANGIOGRAM N/A 08/15/2014   Procedure: LEFT HEART CATHETERIZATION WITH CORONARY ANGIOGRAM;  Surgeon: Clent Demark, MD;  Location: [facility] CATH LAB;  Service: Cardiovascular;  Laterality: N/A;    History reviewed. No pertinent family history.  Social History:  reports that he quit smoking about 14 years ago. His smoking use included cigarettes. He has a 45.00 pack-year smoking history. He has quit using smokeless tobacco. He reports current alcohol use of about 2.0 - 4.0 standard drinks of alcohol per week. He reports that he does not use drugs.  Allergies:  Allergies  Allergen Reactions  . Tape Rash    Medications: I have reviewed the patient's current medications.  Results for orders placed or performed during the hospital encounter of 09/06/18 (from the past 48 hour(s))  Basic metabolic panel     Status: Abnormal   Collection Time: 09/06/18  2:05 AM  Result Value Ref Range   Sodium 137 135 - 145 mmol/L   Potassium 5.2 (H) 3.5 - 5.1 mmol/L   Chloride 106 98 - 111 mmol/L   CO2 20 (L) 22 - 32 mmol/L   Glucose, Bld 125 (H) 70 - 99 mg/dL   BUN 44 (H) 8 - 23 mg/dL   Creatinine, Ser 3.98 (H) 0.61 - 1.24 mg/dL   Calcium 8.7 (L) 8.9 - 10.3 mg/dL   GFR calc non Af Amer 15 (L) >60 mL/min   GFR calc Af Amer 17 (L) >60 mL/min   Anion gap 11 5 - 15    Comment: Performed at  [facility], [place] [address], [place], [place] [address]  CBC with Differential     Status: Abnormal   Collection Time: 09/06/18  2:09 AM  Result Value Ref Range   WBC 5.4 4.0 - 10.5 K/uL   RBC 3.89 (L) 4.22 - 5.81 MIL/uL   Hemoglobin 13.8 13.0 - 17.0 g/dL   HCT 42.0 39.0 - 52.0 %   MCV 108.0 (H) 80.0 - 100.0 fL   MCH 35.5 (H) 26.0 - 34.0 pg   MCHC 32.9 30.0 - 36.0 g/dL   RDW 12.1 11.5 - 15.5 %   Platelets 165 150 - 400 K/uL   nRBC 0.0 0.0 - 0.2 %   Neutrophils Relative % 54 %   Neutro Abs 3.0 1.7 - 7.7 K/uL   Lymphocytes Relative 30 %   Lymphs Abs 1.6 0.7 - 4.0 K/uL   Monocytes  Relative 12 %   Monocytes Absolute 0.6 0.1 - 1.0 K/uL   Eosinophils Relative 2 %   Eosinophils Absolute 0.1 0.0 - 0.5 K/uL   Basophils Relative 1 %   Basophils Absolute 0.0 0.0 - 0.1 K/uL   Immature Granulocytes 1 %   Abs Immature Granulocytes 0.03 0.00 - 0.07 K/uL    Comment: Performed at [facility] [address], [place], [place] [address]  Troponin I - Once     Status: Abnormal   Collection Time: 09/06/18  2:09 AM  Result Value Ref Range   Troponin I 0.18 (HH) <0.03 ng/mL    Comment: CRITICAL RESULT CALLED TO, READ BACK BY AND VERIFIED WITH: GLOUSTER,J RN 09/06/2018 0304 JORDANS Performed at [facility], [place] [address], [place], [place] [address]   Urinalysis, Routine w reflex microscopic     Status: Abnormal   Collection Time: 09/06/18  4:58 AM  Result Value Ref Range   Color, Urine YELLOW YELLOW   APPearance CLEAR CLEAR   Specific Gravity, Urine 1.009 1.005 - 1.030   pH 5.0 5.0 - 8.0   Glucose, UA NEGATIVE NEGATIVE mg/dL   Hgb urine dipstick NEGATIVE NEGATIVE   Bilirubin Urine NEGATIVE NEGATIVE   Ketones, ur NEGATIVE NEGATIVE mg/dL   Protein, ur NEGATIVE NEGATIVE mg/dL   Nitrite NEGATIVE NEGATIVE   Leukocytes,Ua TRACE (A) NEGATIVE   RBC / HPF 0-5 0 - 5 RBC/hpf   WBC, UA 0-5 0 - 5 WBC/hpf   Bacteria, UA NONE SEEN NONE SEEN   Squamous Epithelial / LPF 0-5 0 - 5   Mucus PRESENT    Hyaline Casts, UA PRESENT     Comment: Performed at [facility], [place] [address], [place], [place] [address]  Troponin I - Now Then Q6H     Status: Abnormal   Collection Time: 09/06/18  7:59 AM  Result Value Ref Range   Troponin I 0.11 (HH) <0.03 ng/mL    Comment: CRITICAL VALUE NOTED.  VALUE IS CONSISTENT WITH PREVIOUSLY REPORTED AND CALLED VALUE. Performed at [facility], [place] [address], [place],  [address]   Basic metabolic panel     Status: Abnormal   Collection Time: 09/06/18  7:59 AM  Result Value Ref Range   Sodium 138 135 - 145 mmol/L    Potassium 4.9 3.5 - 5.1 mmol/L   Chloride 108 98 - 111 mmol/L   CO2 22 22 - 32 mmol/L   Glucose, Bld 104 (H) 70 - 99 mg/dL   BUN 42 (H) 8 - 23 mg/dL   Creatinine, Ser 3.44 (  H) 0.61 - 1.24 mg/dL   Calcium 8.5 (L) 8.9 - 10.3 mg/dL   GFR calc non Af Amer 18 (L) >60 mL/min   GFR calc Af Amer 21 (L) >60 mL/min   Anion gap 8 5 - 15    Comment: Performed at [facility] [address], [place], [place] [address]  Digoxin level     Status: Abnormal   Collection Time: 09/06/18  9:14 AM  Result Value Ref Range   Digoxin Level 0.7 (L) 0.8 - 2.0 ng/mL    Comment: Performed at [facility], [place] [address] [place], [place] [address]    Dg Chest 1 View  Result Date: 09/06/2018 CLINICAL DATA:  Weakness EXAM: CHEST  1 VIEW COMPARISON:  12/18/2017 FINDINGS: The heart size and mediastinal contours are within normal limits. Both lungs are clear. Mild aortic atherosclerosis. IMPRESSION: No active disease. Electronically Signed   By: Donavan Foil M.D.   On: 09/06/2018 03:09   Ct Head Wo Contrast  Result Date: 09/06/2018 CLINICAL DATA:  63 year old male with syncope, blunt trauma. EXAM: CT HEAD WITHOUT CONTRAST CT CERVICAL SPINE WITHOUT CONTRAST TECHNIQUE: Multidetector CT imaging of the head and cervical spine was performed following the standard protocol without intravenous contrast. Multiplanar CT image reconstructions of the cervical spine were also generated. COMPARISON:  Neck CTs 05/03/2015 and earlier. FINDINGS: CT HEAD FINDINGS Brain: No midline shift, ventriculomegaly, mass effect, evidence of mass lesion, intracranial hemorrhage or evidence of cortically based acute infarction. Gray-white matter differentiation is within normal limits throughout the brain. Vascular: Mild Calcified atherosclerosis at the skull base. No suspicious intracranial vascular hyperdensity. Skull: Intact. Sinuses/Orbits: Visualized paranasal sinuses and mastoids are stable and well pneumatized. Other: Negative  orbits. No definite acute scalp finding. Multifocal scalp soft tissue scarring suspected. CT CERVICAL SPINE FINDINGS Alignment: Chronic straightening of cervical lordosis, mild reversal. Cervicothoracic junction alignment is within normal limits. Bilateral posterior element alignment is within normal limits. Skull base and vertebrae: Visualized skull base is intact. No atlanto-occipital dissociation. No acute osseous abnormality identified. Soft tissues and spinal canal: No prevertebral fluid or swelling. No visible canal hematoma. Partially retropharyngeal course of the left carotid, normal variant. Disc levels: Progressed upper cervical facet arthropathy on the left at C2-C3 and C3-C4 since 2016. Chronic cervical disc bulging and endplate spurring appears stable. Upper chest: Visible upper thoracic levels appear intact. Negative lung apices. IMPRESSION: 1. No acute traumatic injury identified in the head or cervical spine. 2. Normal for age non contrast CT appearance of the brain. 3. Progressed C2-C3 and C3-C4 left facet degeneration since 2016. Electronically Signed   By: Genevie Ann M.D.   On: 09/06/2018 03:06   Ct Cervical Spine Wo Contrast  Result Date: 09/06/2018 CLINICAL DATA:  63 year old male with syncope, blunt trauma. EXAM: CT HEAD WITHOUT CONTRAST CT CERVICAL SPINE WITHOUT CONTRAST TECHNIQUE: Multidetector CT imaging of the head and cervical spine was performed following the standard protocol without intravenous contrast. Multiplanar CT image reconstructions of the cervical spine were also generated. COMPARISON:  Neck CTs 05/03/2015 and earlier. FINDINGS: CT HEAD FINDINGS Brain: No midline shift, ventriculomegaly, mass effect, evidence of mass lesion, intracranial hemorrhage or evidence of cortically based acute infarction. Gray-white matter differentiation is within normal limits throughout the brain. Vascular: Mild Calcified atherosclerosis at the skull base. No suspicious intracranial vascular  hyperdensity. Skull: Intact. Sinuses/Orbits: Visualized paranasal sinuses and mastoids are stable and well pneumatized. Other: Negative orbits. No definite acute scalp finding. Multifocal scalp soft tissue  scarring suspected. CT CERVICAL SPINE FINDINGS Alignment: Chronic straightening of cervical lordosis, mild reversal. Cervicothoracic junction alignment is within normal limits. Bilateral posterior element alignment is within normal limits. Skull base and vertebrae: Visualized skull base is intact. No atlanto-occipital dissociation. No acute osseous abnormality identified. Soft tissues and spinal canal: No prevertebral fluid or swelling. No visible canal hematoma. Partially retropharyngeal course of the left carotid, normal variant. Disc levels: Progressed upper cervical facet arthropathy on the left at C2-C3 and C3-C4 since 2016. Chronic cervical disc bulging and endplate spurring appears stable. Upper chest: Visible upper thoracic levels appear intact. Negative lung apices. IMPRESSION: 1. No acute traumatic injury identified in the head or cervical spine. 2. Normal for age non contrast CT appearance of the brain. 3. Progressed C2-C3 and C3-C4 left facet degeneration since 2016. Electronically Signed   By: Genevie Ann M.D.   On: 09/06/2018 03:06    Review of Systems  Constitutional: Positive for malaise/fatigue. Negative for fever.  HENT: Negative for hearing loss.   Eyes: Negative for blurred vision.  Respiratory: Negative for cough, hemoptysis and sputum production.   Cardiovascular: Negative for chest pain, palpitations, orthopnea and leg swelling.  Gastrointestinal: Negative for abdominal pain, nausea and vomiting.  Genitourinary: Negative for dysuria.  Musculoskeletal: Positive for neck pain.  Skin: Negative for rash.  Neurological: Positive for dizziness.   Blood pressure 117/65, pulse (!) 41, temperature 97.7 F (36.5 C), temperature source Oral, resp. rate 15, weight 76.2 kg, SpO2 100  %. Physical Exam  Constitutional: He is oriented to person, place, and time.  HENT:  Head: Normocephalic and atraumatic.  Eyes: Pupils are equal, round, and reactive to light. Conjunctivae are normal. Left eye exhibits no discharge. No scleral icterus.  Neck: Normal range of motion. Neck supple. No JVD present. No tracheal deviation present. No thyromegaly present.  Cardiovascular:  Bradycardic S1-S2 soft and soft systolic murmur noted  Respiratory: Effort normal and breath sounds normal. No respiratory distress. He has no wheezes. He has no rales.  GI: Soft. Bowel sounds are normal. He exhibits no distension. There is no abdominal tenderness. There is no rebound.  Musculoskeletal:        General: No tenderness, deformity or edema.  Neurological: He is alert and oriented to person, place, and time.    Assessment/Plan: Status post syncope probably secondary to hypotension/dehydration rule out cardiac arrhythmias Acute on chronic kidney injury secondary to above Minimally elevated troponin I secondary to above doubt significant MI Nonischemic cardiomyopathy History of non-Q wave MI in the setting of cocaine abuse in remote past Hypertension Hyperlipidemia Polysubstance abuse Recent cocaine abuse approximately 2 3 weeks ago Plan Agree with slow hydration Check serial enzymes and EKG Agree with holding beta-blockers cornalor digoxin and Entresto for now Check orthostatics Discussed with patient regarding complete cessation of cocaine and alcohol.  Charolette Forward 09/06/2018, 12:14 PM

## 2018-09-06 NOTE — ED Triage Notes (Signed)
Patient was up to go to the kitchen when he "blacked out". C/o head and neck pain.

## 2018-09-06 NOTE — ED Notes (Signed)
Echocardiogram in progress down in the ED Blue #35.

## 2018-09-07 LAB — COMPREHENSIVE METABOLIC PANEL
ALT: 23 U/L (ref 0–44)
AST: 35 U/L (ref 15–41)
Albumin: 3.1 g/dL — ABNORMAL LOW (ref 3.5–5.0)
Alkaline Phosphatase: 61 U/L (ref 38–126)
Anion gap: 9 (ref 5–15)
BUN: 33 mg/dL — ABNORMAL HIGH (ref 8–23)
CALCIUM: 8.3 mg/dL — AB (ref 8.9–10.3)
CO2: 21 mmol/L — AB (ref 22–32)
Chloride: 107 mmol/L (ref 98–111)
Creatinine, Ser: 2.13 mg/dL — ABNORMAL HIGH (ref 0.61–1.24)
GFR calc Af Amer: 37 mL/min — ABNORMAL LOW (ref >60)
GFR calc non Af Amer: 32 mL/min — ABNORMAL LOW (ref >60)
Glucose, Bld: 96 mg/dL (ref 70–99)
Potassium: 4.6 mmol/L (ref 3.5–5.1)
Sodium: 137 mmol/L (ref 135–145)
Total Bilirubin: 0.4 mg/dL (ref 0.3–1.2)
Total Protein: 5.7 g/dL — ABNORMAL LOW (ref 6.5–8.1)

## 2018-09-07 LAB — CBC
HCT: 39.2 % (ref 39.0–52.0)
Hemoglobin: 12.7 g/dL — ABNORMAL LOW (ref 13.0–17.0)
MCH: 35 pg — ABNORMAL HIGH (ref 26.0–34.0)
MCHC: 32.4 g/dL (ref 30.0–36.0)
MCV: 108 fL — ABNORMAL HIGH (ref 80.0–100.0)
Platelets: 127 10*3/uL — ABNORMAL LOW (ref 150–400)
RBC: 3.63 MIL/uL — ABNORMAL LOW (ref 4.22–5.81)
RDW: 12 % (ref 11.5–15.5)
WBC: 3.9 10*3/uL — ABNORMAL LOW (ref 4.0–10.5)
nRBC: 0 % (ref 0.0–0.2)

## 2018-09-07 LAB — HIV ANTIBODY (ROUTINE TESTING W REFLEX): HIV Screen 4th Generation wRfx: NONREACTIVE

## 2018-09-07 MED ORDER — CARVEDILOL 3.125 MG PO TABS: 3.1250 mg | ORAL_TABLET | Freq: Two times a day (BID) | ORAL | 0 refills | Status: DC

## 2018-09-07 MED ORDER — FUROSEMIDE 40 MG PO TABS: 40.0000 mg | ORAL_TABLET | Freq: Two times a day (BID) | ORAL | 0 refills | Status: DC

## 2018-09-07 MED ORDER — DIGOXIN 62.5 MCG PO TABS: 0.1250 mg | ORAL_TABLET | Freq: Every day | ORAL | 0 refills | Status: DC

## 2018-09-07 NOTE — Progress Notes (Signed)
Discharge and medication education was given with teach back. Peripheral iv was removed,clean dry and intact, pressure and dressing was applied. Prescriptions and belongings with patient: clothes, keys, cell phone(pt did not bring charger with him to hospital),shoes. Patients questions and concerns were answered. Bus tickets were given. Patient denies dizziness or shortness of breath, and refused wheelchair. Patient walked out of unit per pt request. CCMD called.

## 2018-09-07 NOTE — Discharge Instructions (Signed)
Syncope Syncope is when you pass out (faint) for a short time. It is caused by a sudden decrease in blood flow to the brain. Signs that you may be about to pass out include:  Feeling dizzy or light-headed.  Feeling sick to your stomach (nauseous).  Seeing all white or all black.  Having cold, clammy skin. If you pass out, get help right away. Call your local emergency services (911 in the [place] Do not drive yourself to the hospital. Follow these instructions at home: Watch for any changes in your symptoms. Take these actions to stay safe and help with your symptoms: Lifestyle  Do not drive, use machinery, or play sports until your doctor says it is okay.  Do not drink alcohol.  Do not use any products that contain nicotine or tobacco, such as cigarettes and e-cigarettes. If you need help quitting, ask your doctor.  Drink enough fluid to keep your pee (urine) pale yellow. General instructions  Take over-the-counter and prescription medicines only as told by your doctor.  If you are taking blood pressure or heart medicine, sit up and stand up slowly. Spend a few minutes getting ready to sit and then stand. This can help you feel less dizzy.  Have someone stay with you until you feel stable.  If you start to feel like you might pass out, lie down right away and raise (elevate) your feet above the level of your heart. Breathe deeply and steadily. Wait until all of the symptoms are gone.  Keep all follow-up visits as told by your doctor. This is important. Get help right away if:  You have a very bad headache.  You pass out once or more than once.  You have pain in your chest, belly, or back.  You have a very fast or uneven heartbeat (palpitations).  It hurts to breathe.  You are bleeding from your mouth or your bottom (rectum).  You have black or tarry poop (stool).  You have jerky movements that you cannot control (seizure).  You are confused.  You have trouble  walking.  You are very weak.  You have vision problems. These symptoms may be an emergency. Do not wait to see if the symptoms will go away. Get medical help right away. Call your local emergency services (911 in the [place] Do not drive yourself to the hospital. Summary  Syncope is when you pass out (faint) for a short time. It is caused by a sudden decrease in blood flow to the brain.  Signs that you may be about to faint include feeling dizzy, light-headed, or sick to your stomach, seeing all white or all black, or having cold, clammy skin.  If you start to feel like you might pass out, lie down right away and raise (elevate) your feet above the level of your heart. Breathe deeply and steadily. Wait until all of the symptoms are gone. This information is not intended to replace advice given to you by your health care provider. Make sure you discuss any questions you have with your health care provider. Document Released: 12/21/2007 Document Revised: 08/16/2017 Document Reviewed: 08/16/2017 Elsevier Interactive Patient Education  2019 [employer]  

## 2018-09-07 NOTE — Discharge Summary (Signed)
Physician Discharge Summary  [name] YYT:[id number] DOB: [birth year] DOA: 09/06/2018  PCP: Charlott Rakes, MD  Admit date: 09/06/2018 Discharge date: 09/07/2018  Admitted From: Home Disposition:  Home  Recommendations for Outpatient Follow-up:  1. Follow up with PCP in 1-2 weeks 2. Please obtain BMP/CBC in one week   Home Health: No Equipment/Devices:none  Discharge Condition: stable CODE STATUS: FULL Diet recommendation: Heart Healthy    Brief/Interim Summary:  #) Syncope/mildly elevated troponin: Patient was admitted with syncope and found to have AKI.  It is felt the patient had overdiuresis as well as dehydration in the setting of recent drug abuse.  It was also felt that patient's multiple heart failure medications were playing a role.  Cardiologist Dr. Terrence Dupont was called due to mildly elevated troponin and syncope and felt that this was likely secondary to AKI.  Patient's heart failure and blood pressure medications were adjusted.  His digoxin, furosemide, carvedilol doses were all decreased.  #) Hypertension/chronic nonischemic cardiomyopathy: This was in the setting of likely substance abuse.  Echo performed showed EF was 40 to 45% which was improved.  Patient beta-blocker carvedilol was decreased as well as his digoxin dose.  He was continued on Entresto.  His furosemide was decreased to 40 mg twice daily.  #) AKI on stage III CKD: This was attributed to hypovolemia.  His creatinine improved to baseline after IV fluids.  His furosemide dose was adjusted.  He may restart his Entresto on discharge  #) Polysubstance abuse: Counseling was provided.   Discharge Diagnoses:  Principal Problem:   Syncope and collapse Active Problems:   Hypotensive episode   CKD (chronic kidney disease) stage 3, GFR 30-59 ml/min (HCC)   Chronic combined systolic and diastolic congestive heart failure (HCC)   History of non-ST elevation myocardial infarction (NSTEMI)   Polysubstance abuse  (HCC)   Renal failure (ARF), acute on chronic (HCC)   Troponin level elevated    Discharge Instructions  Discharge Instructions    Call MD for:  difficulty breathing, headache or visual disturbances   Complete by:  As directed    Call MD for:  hives   Complete by:  As directed    Call MD for:  persistant dizziness or light-headedness   Complete by:  As directed    Call MD for:  persistant nausea and vomiting   Complete by:  As directed    Call MD for:  redness, tenderness, or signs of infection (pain, swelling, redness, odor or green/yellow discharge around incision site)   Complete by:  As directed    Call MD for:  severe uncontrolled pain   Complete by:  As directed    Call MD for:  temperature >100.4   Complete by:  As directed    Diet - low sodium heart healthy   Complete by:  As directed    Discharge instructions   Complete by:  As directed    Please follow-up with your primary care doctor in 1 week.  Please follow-up with your cardiologist in 1 to 2 weeks.   Increase activity slowly   Complete by:  As directed      Allergies as of 09/07/2018      Reactions   Tape Rash      Medication List    STOP taking these medications   predniSONE 10 MG (21) Tbpk tablet Commonly known as:  STERAPRED UNI-PAK 21 TAB     TAKE these medications   allopurinol 300 MG tablet Commonly known as:  ZYLOPRIM Take 0.5 tablets (150 mg total) by mouth daily.   aspirin 81 MG EC tablet Take 1 tablet (81 mg total) by mouth daily.   b complex vitamins tablet Take 1 tablet by mouth daily.   carvedilol 3.125 MG tablet Commonly known as:  COREG Take 1 tablet (3.125 mg total) by mouth 2 (two) times daily for 30 days. What changed:    medication strength  how much to take   Digoxin 62.5 MCG Tabs Take 0.125 mg by mouth daily for 30 days. What changed:  medication strength   furosemide 40 MG tablet Commonly known as:  LASIX Take 1 tablet (40 mg total) by mouth 2 (two) times daily  for 30 days. What changed:    medication strength  how much to take   gabapentin 300 MG capsule Commonly known as:  NEURONTIN Take 2 capsules (600 mg total) by mouth 2 (two) times daily.   nitroGLYCERIN 0.4 MG SL tablet Commonly known as:  NITROSTAT Place 1 tablet (0.4 mg total) under the tongue every 5 (five) minutes x 3 doses as needed for chest pain.   potassium chloride SA 20 MEQ tablet Commonly known as:  K-DUR,KLOR-CON Take 20 mEq by mouth daily.   sacubitril-valsartan 24-26 MG Commonly known as:  ENTRESTO Take 1 tablet by mouth 2 (two) times daily.   tiZANidine 4 MG tablet Commonly known as:  ZANAFLEX Take 1 tablet (4 mg total) by mouth every 6 (six) hours as needed for muscle spasms.      Follow-up Information    Charlott Rakes, MD.   Specialty:  Family Medicine Why:  please call for appointment Contact information: [address] [place] [address] [phone]          Allergies  Allergen Reactions  . Tape Rash    Consultations:  Cardiology, Dr Terrence Dupont   Procedures/Studies: Dg Chest 1 View  Result Date: 09/06/2018 CLINICAL DATA:  Weakness EXAM: CHEST  1 VIEW COMPARISON:  12/18/2017 FINDINGS: The heart size and mediastinal contours are within normal limits. Both lungs are clear. Mild aortic atherosclerosis. IMPRESSION: No active disease. Electronically Signed   By: Donavan Foil M.D.   On: 09/06/2018 03:09   Ct Head Wo Contrast  Result Date: 09/06/2018 CLINICAL DATA:  63 year old male with syncope, blunt trauma. EXAM: CT HEAD WITHOUT CONTRAST CT CERVICAL SPINE WITHOUT CONTRAST TECHNIQUE: Multidetector CT imaging of the head and cervical spine was performed following the standard protocol without intravenous contrast. Multiplanar CT image reconstructions of the cervical spine were also generated. COMPARISON:  Neck CTs 05/03/2015 and earlier. FINDINGS: CT HEAD FINDINGS Brain: No midline shift, ventriculomegaly, mass effect, evidence of mass  lesion, intracranial hemorrhage or evidence of cortically based acute infarction. Gray-white matter differentiation is within normal limits throughout the brain. Vascular: Mild Calcified atherosclerosis at the skull base. No suspicious intracranial vascular hyperdensity. Skull: Intact. Sinuses/Orbits: Visualized paranasal sinuses and mastoids are stable and well pneumatized. Other: Negative orbits. No definite acute scalp finding. Multifocal scalp soft tissue scarring suspected. CT CERVICAL SPINE FINDINGS Alignment: Chronic straightening of cervical lordosis, mild reversal. Cervicothoracic junction alignment is within normal limits. Bilateral posterior element alignment is within normal limits. Skull base and vertebrae: Visualized skull base is intact. No atlanto-occipital dissociation. No acute osseous abnormality identified. Soft tissues and spinal canal: No prevertebral fluid or swelling. No visible canal hematoma. Partially retropharyngeal course of the left carotid, normal variant. Disc levels: Progressed upper cervical facet arthropathy on the left at C2-C3 and C3-C4 since 2016.  Chronic cervical disc bulging and endplate spurring appears stable. Upper chest: Visible upper thoracic levels appear intact. Negative lung apices. IMPRESSION: 1. No acute traumatic injury identified in the head or cervical spine. 2. Normal for age non contrast CT appearance of the brain. 3. Progressed C2-C3 and C3-C4 left facet degeneration since 2016. Electronically Signed   By: Genevie Ann M.D.   On: 09/06/2018 03:06   Ct Cervical Spine Wo Contrast  Result Date: 09/06/2018 CLINICAL DATA:  63 year old male with syncope, blunt trauma. EXAM: CT HEAD WITHOUT CONTRAST CT CERVICAL SPINE WITHOUT CONTRAST TECHNIQUE: Multidetector CT imaging of the head and cervical spine was performed following the standard protocol without intravenous contrast. Multiplanar CT image reconstructions of the cervical spine were also generated. COMPARISON:   Neck CTs 05/03/2015 and earlier. FINDINGS: CT HEAD FINDINGS Brain: No midline shift, ventriculomegaly, mass effect, evidence of mass lesion, intracranial hemorrhage or evidence of cortically based acute infarction. Gray-white matter differentiation is within normal limits throughout the brain. Vascular: Mild Calcified atherosclerosis at the skull base. No suspicious intracranial vascular hyperdensity. Skull: Intact. Sinuses/Orbits: Visualized paranasal sinuses and mastoids are stable and well pneumatized. Other: Negative orbits. No definite acute scalp finding. Multifocal scalp soft tissue scarring suspected. CT CERVICAL SPINE FINDINGS Alignment: Chronic straightening of cervical lordosis, mild reversal. Cervicothoracic junction alignment is within normal limits. Bilateral posterior element alignment is within normal limits. Skull base and vertebrae: Visualized skull base is intact. No atlanto-occipital dissociation. No acute osseous abnormality identified. Soft tissues and spinal canal: No prevertebral fluid or swelling. No visible canal hematoma. Partially retropharyngeal course of the left carotid, normal variant. Disc levels: Progressed upper cervical facet arthropathy on the left at C2-C3 and C3-C4 since 2016. Chronic cervical disc bulging and endplate spurring appears stable. Upper chest: Visible upper thoracic levels appear intact. Negative lung apices. IMPRESSION: 1. No acute traumatic injury identified in the head or cervical spine. 2. Normal for age non contrast CT appearance of the brain. 3. Progressed C2-C3 and C3-C4 left facet degeneration since 2016. Electronically Signed   By: Genevie Ann M.D.   On: 09/06/2018 03:06   Echo 09/06/2018: 1. The left ventricle has moderately reduced systolic function, with an ejection fraction of 35-40%. The cavity size was normal. Left ventricular diastolic parameters were normal Left ventrical global hypokinesis without regional wall motion  abnormalities.  2. The mitral  valve is normal in structure. No evidence of mitral valve stenosis.  3. The tricuspid valve is normal in structure.  4. The aortic valve is normal in structure. no stenosis of the aortic valve.  5. The pulmonic valve was normal in structure.  FINDINGS  Left Ventricle: The left ventricle has moderately reduced systolic function, with an ejection fraction of 35-40%. The cavity size was normal. There is no increase in left ventricular wall thickness. Left ventricular diastolic parameters were normal Left  ventrical global hypokinesis without regional wall motion abnormalities. Right Ventricle: The right ventricle has mildly reduced systolic function. The cavity was normal. There is no increase in right ventricular wall thickness. Left Atrium: left atrial size was normal in size Right Atrium: right atrial size was normal in size. Right atrial pressure is estimated at 3 mmHg. Interatrial Septum: No atrial level shunt detected by color flow Doppler. Pericardium: There is no evidence of pericardial effusion. Mitral Valve: The mitral valve is normal in structure. Mitral valve regurgitation is trivial by color flow Doppler. No evidence of mitral valve stenosis. Tricuspid Valve: The tricuspid valve is normal in structure.  Tricuspid valve regurgitation is trivial by color flow Doppler. Aortic Valve: The aortic valve is normal in structure. Aortic valve regurgitation was not visualized by color flow Doppler. There is no stenosis of the aortic valve, with a calculated valve area of 1.85 cm. Pulmonic Valve: The pulmonic valve was normal in structure. Pulmonic valve regurgitation is not visualized by color flow Doppler.      Subjective:   Discharge Exam: Vitals:   09/07/18 0024 09/07/18 0404  BP: 140/74 134/73  Pulse: (!) 55 (!) 53  Resp: 18 18  Temp: 98.7 F (37.1 C) 98.4 F (36.9 C)  SpO2: 100% 100%   Vitals:   09/06/18 2017 09/07/18 0024 09/07/18 0404 09/07/18 0418  BP: 113/76 140/74  134/73   Pulse: 75 (!) 55 (!) 53   Resp:  18 18   Temp:  98.7 F (37.1 C) 98.4 F (36.9 C)   TempSrc:  Oral Oral   SpO2: 100% 100% 100%   Weight:    85.4 kg  Height:        General: Pt is alert, awake, not in acute distress Cardiovascular: RRR, S1/S2 +, no rubs, no gallops Respiratory: CTA bilaterally, no wheezing, no rhonchi Abdominal: Soft, NT, ND, bowel sounds + Extremities: no edema, no cyanosis    The results of significant diagnostics from this hospitalization (including imaging, microbiology, ancillary and laboratory) are listed below for reference.     Microbiology: Recent Results (from the past 240 hour(s))  MRSA PCR Screening     Status: None   Collection Time: 09/06/18  8:39 PM  Result Value Ref Range Status   MRSA by PCR NEGATIVE NEGATIVE Final    Comment:        The GeneXpert MRSA Assay (FDA approved for NASAL specimens only), is one component of a comprehensive MRSA colonization surveillance program. It is not intended to diagnose MRSA infection nor to guide or monitor treatment for MRSA infections. Performed at [facility], [place] [address] [place], [place] [address]      Labs: BNP (last 3 results) No results for input(s): BNP in the last 8760 hours. Basic Metabolic Panel: Recent Labs  Lab 09/06/18 0205 09/06/18 0759 09/07/18 0508  NA 137 138 137  K 5.2* 4.9 4.6  CL 106 108 107  CO2 20* 22 21*  GLUCOSE 125* 104* 96  BUN 44* 42* 33*  CREATININE 3.98* 3.44* 2.13*  CALCIUM 8.7* 8.5* 8.3*   Liver Function Tests: Recent Labs  Lab 09/07/18 0508  AST 35  ALT 23  ALKPHOS 61  BILITOT 0.4  PROT 5.7*  ALBUMIN 3.1*   No results for input(s): LIPASE, AMYLASE in the last 168 hours. No results for input(s): AMMONIA in the last 168 hours. CBC: Recent Labs  Lab 09/06/18 0209 09/07/18 0508  WBC 5.4 3.9*  NEUTROABS 3.0  --   HGB 13.8 12.7*  HCT 42.0 39.2  MCV 108.0* 108.0*  PLT 165 127*   Cardiac Enzymes: Recent Labs  Lab  09/06/18 0209 09/06/18 0759 09/06/18 1413 09/06/18 1837  TROPONINI 0.18* 0.11* 0.08* 0.07*   BNP: Invalid input(s): POCBNP CBG: No results for input(s): GLUCAP in the last 168 hours. D-Dimer No results for input(s): DDIMER in the last 72 hours. Hgb A1c No results for input(s): HGBA1C in the last 72 hours. Lipid Profile No results for input(s): CHOL, HDL, LDLCALC, TRIG, CHOLHDL, LDLDIRECT in the last 72 hours. Thyroid function studies No results for input(s): TSH, T4TOTAL, T3FREE, THYROIDAB in the last 72  hours.  Invalid input(s): FREET3 Anemia work up No results for input(s): VITAMINB12, FOLATE, FERRITIN, TIBC, IRON, RETICCTPCT in the last 72 hours. Urinalysis    Component Value Date/Time   COLORURINE YELLOW 09/06/2018 [place] 09/06/2018 0458   LABSPEC 1.009 09/06/2018 0458   PHURINE 5.0 09/06/2018 0458   GLUCOSEU NEGATIVE 09/06/2018 0458   HGBUR NEGATIVE 09/06/2018 0458   BILIRUBINUR NEGATIVE 09/06/2018 0458   KETONESUR NEGATIVE 09/06/2018 0458   PROTEINUR NEGATIVE 09/06/2018 0458   UROBILINOGEN 1.0 05/13/2015 1854   NITRITE NEGATIVE 09/06/2018 0458   LEUKOCYTESUR TRACE (A) 09/06/2018 0458   Sepsis Labs Invalid input(s): PROCALCITONIN,  WBC,  LACTICIDVEN Microbiology Recent Results (from the past 240 hour(s))  MRSA PCR Screening     Status: None   Collection Time: 09/06/18  8:39 PM  Result Value Ref Range Status   MRSA by PCR NEGATIVE NEGATIVE Final    Comment:        The GeneXpert MRSA Assay (FDA approved for NASAL specimens only), is one component of a comprehensive MRSA colonization surveillance program. It is not intended to diagnose MRSA infection nor to guide or monitor treatment for MRSA infections. Performed at [facility], [place] [address], [place], [place] [address]      Time coordinating discharge: 24  SIGNED:   Cristy Folks, MD  [facility] 09/07/2018, 9:43 AM  If 7PM-7AM, please contact  night-coverage [email or web address] Password [email or web address]

## 2018-09-07 NOTE — Progress Notes (Signed)
Subjective:  Doing well denies any chest pain no shortness of breath.  Denies any palpitations.  Denies dizziness or lightheadedness.  No further hypotension renal function improved back towards  its baseline.  Objective:  Vital Signs in the last 24 hours: Temp:  [98 F (36.7 C)-98.7 F (37.1 C)] 98.4 F (36.9 C) (02/21 0404) Pulse Rate:  [40-75] 53 (02/21 0404) Resp:  [11-22] 18 (02/21 0404) BP: (96-140)/(58-86) 134/73 (02/21 0404) SpO2:  [96 %-100 %] 100 % (02/21 0404) Weight:  [84.9 kg-85.4 kg] 85.4 kg (02/21 0418)  Intake/Output from previous day: 02/20 0701 - 02/21 0700 In: 1050 [I.V.:1050] Out: 1550 [Urine:1550] Intake/Output from this shift: No intake/output data recorded.  Physical Exam: Neck: no adenopathy, no carotid bruit, no JVD and supple, symmetrical, trachea midline Lungs: clear to auscultation bilaterally Heart: regular rate and rhythm, S1, S2 normal and Soft systolic murmur noted Abdomen: soft, non-tender; bowel sounds normal; no masses,  no organomegaly Extremities: extremities normal, atraumatic, no cyanosis or edema  Lab Results: Recent Labs    09/06/18 0209 09/07/18 0508  WBC 5.4 3.9*  HGB 13.8 12.7*  PLT 165 127*   Recent Labs    09/06/18 0759 09/07/18 0508  NA 138 137  K 4.9 4.6  CL 108 107  CO2 22 21*  GLUCOSE 104* 96  BUN 42* 33*  CREATININE 3.44* 2.13*   Recent Labs    09/06/18 1413 09/06/18 1837  TROPONINI 0.08* 0.07*   Hepatic Function Panel Recent Labs    09/07/18 0508  PROT 5.7*  ALBUMIN 3.1*  AST 35  ALT 23  ALKPHOS 61  BILITOT 0.4   No results for input(s): CHOL in the last 72 hours. No results for input(s): PROTIME in the last 72 hours.  Imaging: Imaging results have been reviewed and Dg Chest 1 View  Result Date: 09/06/2018 CLINICAL DATA:  Weakness EXAM: CHEST  1 VIEW COMPARISON:  12/18/2017 FINDINGS: The heart size and mediastinal contours are within normal limits. Both lungs are clear. Mild aortic  atherosclerosis. IMPRESSION: No active disease. Electronically Signed   By: Donavan Foil M.D.   On: 09/06/2018 03:09   Ct Head Wo Contrast  Result Date: 09/06/2018 CLINICAL DATA:  63 year old male with syncope, blunt trauma. EXAM: CT HEAD WITHOUT CONTRAST CT CERVICAL SPINE WITHOUT CONTRAST TECHNIQUE: Multidetector CT imaging of the head and cervical spine was performed following the standard protocol without intravenous contrast. Multiplanar CT image reconstructions of the cervical spine were also generated. COMPARISON:  Neck CTs 05/03/2015 and earlier. FINDINGS: CT HEAD FINDINGS Brain: No midline shift, ventriculomegaly, mass effect, evidence of mass lesion, intracranial hemorrhage or evidence of cortically based acute infarction. Gray-white matter differentiation is within normal limits throughout the brain. Vascular: Mild Calcified atherosclerosis at the skull base. No suspicious intracranial vascular hyperdensity. Skull: Intact. Sinuses/Orbits: Visualized paranasal sinuses and mastoids are stable and well pneumatized. Other: Negative orbits. No definite acute scalp finding. Multifocal scalp soft tissue scarring suspected. CT CERVICAL SPINE FINDINGS Alignment: Chronic straightening of cervical lordosis, mild reversal. Cervicothoracic junction alignment is within normal limits. Bilateral posterior element alignment is within normal limits. Skull base and vertebrae: Visualized skull base is intact. No atlanto-occipital dissociation. No acute osseous abnormality identified. Soft tissues and spinal canal: No prevertebral fluid or swelling. No visible canal hematoma. Partially retropharyngeal course of the left carotid, normal variant. Disc levels: Progressed upper cervical facet arthropathy on the left at C2-C3 and C3-C4 since 2016. Chronic cervical disc bulging and endplate spurring appears stable. Upper chest:  Visible upper thoracic levels appear intact. Negative lung apices. IMPRESSION: 1. No acute traumatic  injury identified in the head or cervical spine. 2. Normal for age non contrast CT appearance of the brain. 3. Progressed C2-C3 and C3-C4 left facet degeneration since 2016. Electronically Signed   By: Genevie Ann M.D.   On: 09/06/2018 03:06   Ct Cervical Spine Wo Contrast  Result Date: 09/06/2018 CLINICAL DATA:  63 year old male with syncope, blunt trauma. EXAM: CT HEAD WITHOUT CONTRAST CT CERVICAL SPINE WITHOUT CONTRAST TECHNIQUE: Multidetector CT imaging of the head and cervical spine was performed following the standard protocol without intravenous contrast. Multiplanar CT image reconstructions of the cervical spine were also generated. COMPARISON:  Neck CTs 05/03/2015 and earlier. FINDINGS: CT HEAD FINDINGS Brain: No midline shift, ventriculomegaly, mass effect, evidence of mass lesion, intracranial hemorrhage or evidence of cortically based acute infarction. Gray-white matter differentiation is within normal limits throughout the brain. Vascular: Mild Calcified atherosclerosis at the skull base. No suspicious intracranial vascular hyperdensity. Skull: Intact. Sinuses/Orbits: Visualized paranasal sinuses and mastoids are stable and well pneumatized. Other: Negative orbits. No definite acute scalp finding. Multifocal scalp soft tissue scarring suspected. CT CERVICAL SPINE FINDINGS Alignment: Chronic straightening of cervical lordosis, mild reversal. Cervicothoracic junction alignment is within normal limits. Bilateral posterior element alignment is within normal limits. Skull base and vertebrae: Visualized skull base is intact. No atlanto-occipital dissociation. No acute osseous abnormality identified. Soft tissues and spinal canal: No prevertebral fluid or swelling. No visible canal hematoma. Partially retropharyngeal course of the left carotid, normal variant. Disc levels: Progressed upper cervical facet arthropathy on the left at C2-C3 and C3-C4 since 2016. Chronic cervical disc bulging and endplate spurring  appears stable. Upper chest: Visible upper thoracic levels appear intact. Negative lung apices. IMPRESSION: 1. No acute traumatic injury identified in the head or cervical spine. 2. Normal for age non contrast CT appearance of the brain. 3. Progressed C2-C3 and C3-C4 left facet degeneration since 2016. Electronically Signed   By: Genevie Ann M.D.   On: 09/06/2018 03:06    Cardiac Studies:  Assessment/Plan:  Status post syncope probably secondary to hypotension/dehydration Acute on chronic kidney injury secondary to above Minimally elevated troponin I secondary to above doubt significant MI Nonischemic cardiomyopathy History of non-Q wave MI in the setting of cocaine abuse in remote past Hypertension Hyperlipidemia Polysubstance abuse Recent cocaine abuse approximately 2 3 weeks ago Plan Reduce Coreg to 3.125 mg twice daily and reduce digoxin to 0.0625 mg daily Reduce Lasix to 40 mg twice daily. Patient has stopped using cornalor. Okay to discharge from cardiac point of view Follow-up with me in 1 week Discussed with patient at length regarding lifestyle changes compliance with medications follow-up and alcohol and cocaine cessation.  LOS: 1 day    Charolette Forward 09/07/2018, 8:52 AM

## 2018-09-10 ENCOUNTER — Other Ambulatory Visit: Payer: Self-pay | Admitting: Family Medicine

## 2018-09-10 DIAGNOSIS — M25511 Pain in right shoulder: Principal | ICD-10-CM

## 2018-09-10 DIAGNOSIS — G8929 Other chronic pain: Secondary | ICD-10-CM

## 2018-09-19 NOTE — Progress Notes (Signed)
Patient ID: [name], male   DOB: 1955/10/28, 64 y.o.   MRN: [id number]      [name], is a 63 y.o. male  HDQ:[id number]  [id number]  DOB - 1955/12/30  Subjective:  Chief Complaint and HPI: [name] is a 63 y.o. male here today to for a follow up visit After hospitalization for syncopal episode 2/20-2/21/20.  Doing much better.  No further syncopal episodes.  Needs cardiology referral.  Also needs RF on cream for rash.  From discharge summary: Brief/Interim Summary:  #) Syncope/mildly elevated troponin: Patient was admitted with syncope and found to have AKI.  It is felt the patient had overdiuresis as well as dehydration in the setting of recent drug abuse.  It was also felt that patient's multiple heart failure medications were playing a role.  Cardiologist Dr. Terrence Dupont was called due to mildly elevated troponin and syncope and felt that this was likely secondary to AKI.  Patient's heart failure and blood pressure medications were adjusted.  His digoxin, furosemide, carvedilol doses were all decreased.  #) Hypertension/chronic nonischemic cardiomyopathy: This was in the setting of likely substance abuse.  Echo performed showed EF was 40 to 45% which was improved.  Patient beta-blocker carvedilol was decreased as well as his digoxin dose.  He was continued on Entresto.  His furosemide was decreased to 40 mg twice daily.  #) AKI on stage III CKD: This was attributed to hypovolemia.  His creatinine improved to baseline after IV fluids.  His furosemide dose was adjusted.  He may restart his Entresto on discharge  #) Polysubstance abuse: Counseling was provided.   Discharge Diagnoses:  Principal Problem:   Syncope and collapse Active Problems:   Hypotensive episode   CKD (chronic kidney disease) stage 3, GFR 30-59 ml/min (HCC)   Chronic combined systolic and diastolic congestive heart failure (HCC)   History of non-ST elevation myocardial infarction  (NSTEMI)   Polysubstance abuse (HCC)   Renal failure (ARF), acute on chronic (HCC)   Troponin level elevated  ED/Hospital notes reviewed.    ROS:   Constitutional:  No f/c, No night sweats, No unexplained weight loss. EENT:  No vision changes, No blurry vision, No hearing changes. No mouth, throat, or ear problems.  Respiratory: No cough, No SOB Cardiac: No CP, no palpitations GI:  No abd pain, No N/V/D. GU: No Urinary s/sx Musculoskeletal: No joint pain Neuro: No headache, no dizziness, no motor weakness.  Skin: + rash Endocrine:  No polydipsia. No polyuria.  Psych: Denies SI/HI  No problems updated.  ALLERGIES: Allergies  Allergen Reactions  . Tape Rash    PAST MEDICAL HISTORY: Past Medical History:  Diagnosis Date  . Anxiety   . Arthritis    "all over" (01/07/2015)  . CKD (chronic kidney disease), stage IV ([place])   . Depression   . Headache    "probably weekly" (01/07/2015)  . Heart attack ([place]) 07/2014  . History of echocardiogram 12/2014   EF 20-25%  . Homelessness   . Hypercholesterolemia   . Hypertension   . Ischemic dilated cardiomyopathy ([place])   . Noncompliance   . NSTEMI (non-ST elevated myocardial infarction) ([place]) 01/07/2015  . Polysubstance abuse (HCC)    etoh, cocaine  . Urinary hesitancy   . Walking pneumonia 07/2014    MEDICATIONS AT HOME: Prior to Admission medications   Medication Sig Start Date End Date Taking? Authorizing Provider  aspirin 81 MG EC tablet Take 1 tablet (81 mg total) by mouth daily. 01/06/17  Yes Charlott Rakes, MD  b complex vitamins tablet Take 1 tablet by mouth daily.   Yes [provider]  carvedilol (COREG) 3.125 MG tablet Take 1 tablet (3.125 mg total) by mouth 2 (two) times daily for 30 days. 09/20/18 10/20/18 Yes Tristy Udovich, Dionne Bucy, PA-C  Digoxin 62.5 MCG TABS Take 0.125 mg by mouth daily for 30 days. 09/20/18 10/20/18 Yes Kandi Brusseau, Dionne Bucy, PA-C  furosemide (LASIX) 40 MG tablet Take 1 tablet (40 mg total) by mouth daily  for 30 days. 09/20/18 10/20/18 Yes Ferrel Simington, Dionne Bucy, PA-C  gabapentin (NEURONTIN) 300 MG capsule Take 2 capsules (600 mg total) by mouth 2 (two) times daily. 07/23/18  Yes Charlott Rakes, MD  nitroGLYCERIN (NITROSTAT) 0.4 MG SL tablet Place 1 tablet (0.4 mg total) under the tongue every 5 (five) minutes x 3 doses as needed for chest pain. 01/06/17  Yes Charlott Rakes, MD  potassium chloride SA (K-DUR,KLOR-CON) 20 MEQ tablet Take 20 mEq by mouth daily. 06/04/16  Yes [provider]  sacubitril-valsartan (ENTRESTO) 24-26 MG Take 1 tablet by mouth 2 (two) times daily. 09/20/18  Yes Freeman Caldron M, PA-C  tiZANidine (ZANAFLEX) 4 MG tablet Take 1 tablet (4 mg total) by mouth every 6 (six) hours as needed for muscle spasms. 09/20/18  Yes Freeman Caldron M, PA-C  allopurinol (ZYLOPRIM) 300 MG tablet Take 0.5 tablets (150 mg total) by mouth daily. 09/20/18   Argentina Donovan, PA-C  clotrimazole-betamethasone (LOTRISONE) cream Apply 1 application topically 2 (two) times daily. 09/20/18   Argentina Donovan, PA-C     Objective:  EXAM:   Vitals:   09/20/18 0936  BP: (!) 164/84  Pulse: (!) 58  Temp: 98.7 F (37.1 C)  TempSrc: Oral  SpO2: 97%  Weight: 192 lb (87.1 kg)  Height: 5\' 8"  (1.727 m)    General appearance : A&OX3. NAD. Non-toxic-appearing HEENT: Atraumatic and Normocephalic.  PERRLA. EOM intact.   Neck: supple, no JVD. No cervical lymphadenopathy. No thyromegaly Chest/Lungs:  Breathing-non-labored, Good air entry bilaterally, breath sounds normal without rales, rhonchi, or wheezing  CVS: S1 S2 regular, no murmurs, gallops, rubs  Extremities: Bilateral Lower Ext shows no edema, both legs are warm to touch with = pulse throughout Neurology:  CN II-XII grossly intact, Non focal.   Psych:  TP linear. J/I WNL. Normal speech. Appropriate eye contact and affect.  Skin:  Dry eczematous rash L ankle and L calf  Data Review Lab Results  Component Value Date   HGBA1C 5.4 08/14/2014   HGBA1C  5.4 08/13/2014     Assessment & Plan   1. Chronic right shoulder pain - tiZANidine (ZANAFLEX) 4 MG tablet; Take 1 tablet (4 mg total) by mouth every 6 (six) hours as needed for muscle spasms.  Dispense: 30 tablet; Refill: 0  2. Chronic combined systolic and diastolic congestive heart failure (HCC) - Digoxin 62.5 MCG TABS; Take 0.125 mg by mouth daily for 30 days.  Dispense: 30 tablet; Refill: 2 - furosemide (LASIX) 40 MG tablet; Take 1 tablet (40 mg total) by mouth bid for 30 days.  Dispense: 30 tablet; Refill: 2 - Ambulatory referral to Cardiology  3. Benign essential HTN Not at goal - Basic metabolic panel - CBC with Differential/Platelet - carvedilol (COREG) 3.125 MG tablet; Take 1 tablet (3.125 mg total) by mouth 2 (two) times daily for 30 days.  Dispense: 60 tablet; Refill: 2 - Ambulatory referral to Cardiology  4. Other secondary acute gout of left ankle - allopurinol (ZYLOPRIM) 300  MG tablet; Take 0.5 tablets (150 mg total) by mouth daily.  Dispense: 30 tablet; Refill: 3  5. Hospital discharge follow-up No further syncopal episodes - Ambulatory referral to Cardiology  6. Syncope, unspecified syncope type No further episodes.   - Basic metabolic panel - CBC with Differential/Platelet - Ambulatory referral to Cardiology  7. Dermatitis - clotrimazole-betamethasone (LOTRISONE) cream; Apply 1 application topically 2 (two) times daily.  Dispense: 45 g; Refill: 3     Patient have been counseled extensively about nutrition and exercise  Return in about 1 month (around 10/21/2018) for Dr Margarita Rana for BP/labs/heart failure.  The patient was given clear instructions to go to ER or return to medical center if symptoms don't improve, worsen or new problems develop. The patient verbalized understanding. The patient was told to call to get lab results if they haven't heard anything in the next week.    Freeman Caldron, PA-C [facility] and [place], [place]   09/20/2018, 9:57 AM

## 2018-09-20 ENCOUNTER — Other Ambulatory Visit: Payer: Self-pay | Admitting: Physician Assistant

## 2018-09-20 ENCOUNTER — Ambulatory Visit: Payer: Medicaid Other | Attending: Family Medicine | Admitting: Physician Assistant

## 2018-09-20 VITALS — BP 164/84 | HR 58 | Temp 98.7°F | Ht 68.0 in | Wt 192.0 lb

## 2018-09-20 DIAGNOSIS — I420 Dilated cardiomyopathy: Secondary | ICD-10-CM | POA: Insufficient documentation

## 2018-09-20 DIAGNOSIS — I255 Ischemic cardiomyopathy: Secondary | ICD-10-CM | POA: Insufficient documentation

## 2018-09-20 DIAGNOSIS — Z590 Homelessness: Secondary | ICD-10-CM | POA: Diagnosis not present

## 2018-09-20 DIAGNOSIS — G8929 Other chronic pain: Secondary | ICD-10-CM

## 2018-09-20 DIAGNOSIS — I252 Old myocardial infarction: Secondary | ICD-10-CM | POA: Insufficient documentation

## 2018-09-20 DIAGNOSIS — L309 Dermatitis, unspecified: Secondary | ICD-10-CM | POA: Diagnosis not present

## 2018-09-20 DIAGNOSIS — I5042 Chronic combined systolic (congestive) and diastolic (congestive) heart failure: Secondary | ICD-10-CM

## 2018-09-20 DIAGNOSIS — M10472 Other secondary gout, left ankle and foot: Secondary | ICD-10-CM | POA: Diagnosis not present

## 2018-09-20 DIAGNOSIS — E861 Hypovolemia: Secondary | ICD-10-CM | POA: Diagnosis not present

## 2018-09-20 DIAGNOSIS — I130 Hypertensive heart and chronic kidney disease with heart failure and stage 1 through stage 4 chronic kidney disease, or unspecified chronic kidney disease: Secondary | ICD-10-CM | POA: Insufficient documentation

## 2018-09-20 DIAGNOSIS — E7800 Pure hypercholesterolemia, unspecified: Secondary | ICD-10-CM | POA: Insufficient documentation

## 2018-09-20 DIAGNOSIS — R55 Syncope and collapse: Secondary | ICD-10-CM | POA: Diagnosis not present

## 2018-09-20 DIAGNOSIS — I10 Essential (primary) hypertension: Secondary | ICD-10-CM | POA: Diagnosis not present

## 2018-09-20 DIAGNOSIS — Z7982 Long term (current) use of aspirin: Secondary | ICD-10-CM | POA: Diagnosis not present

## 2018-09-20 DIAGNOSIS — M1990 Unspecified osteoarthritis, unspecified site: Secondary | ICD-10-CM | POA: Diagnosis not present

## 2018-09-20 DIAGNOSIS — N184 Chronic kidney disease, stage 4 (severe): Secondary | ICD-10-CM | POA: Insufficient documentation

## 2018-09-20 DIAGNOSIS — M109 Gout, unspecified: Secondary | ICD-10-CM | POA: Diagnosis not present

## 2018-09-20 DIAGNOSIS — Z7901 Long term (current) use of anticoagulants: Secondary | ICD-10-CM | POA: Diagnosis not present

## 2018-09-20 DIAGNOSIS — Z79899 Other long term (current) drug therapy: Secondary | ICD-10-CM | POA: Diagnosis not present

## 2018-09-20 DIAGNOSIS — M25511 Pain in right shoulder: Secondary | ICD-10-CM | POA: Insufficient documentation

## 2018-09-20 DIAGNOSIS — Z09 Encounter for follow-up examination after completed treatment for conditions other than malignant neoplasm: Secondary | ICD-10-CM

## 2018-09-20 MED ORDER — FUROSEMIDE 40 MG PO TABS: 40.0000 mg | ORAL_TABLET | Freq: Every day | ORAL | 2 refills | Status: DC

## 2018-09-20 MED ORDER — CLOTRIMAZOLE-BETAMETHASONE 1-0.05 % EX CREA: 1.0000 "application " | TOPICAL_CREAM | Freq: Two times a day (BID) | CUTANEOUS | 3 refills | Status: DC

## 2018-09-20 MED ORDER — SACUBITRIL-VALSARTAN 24-26 MG PO TABS: 1.0000 | ORAL_TABLET | Freq: Two times a day (BID) | ORAL | 3 refills | Status: DC

## 2018-09-20 MED ORDER — FUROSEMIDE 40 MG PO TABS: 40.0000 mg | ORAL_TABLET | Freq: Two times a day (BID) | ORAL | 2 refills | Status: DC

## 2018-09-20 MED ORDER — DIGOXIN 62.5 MCG PO TABS: 0.1250 mg | ORAL_TABLET | Freq: Every day | ORAL | 2 refills | Status: DC

## 2018-09-20 MED ORDER — CARVEDILOL 3.125 MG PO TABS: 3.1250 mg | ORAL_TABLET | Freq: Two times a day (BID) | ORAL | 2 refills | Status: DC

## 2018-09-20 MED ORDER — ALLOPURINOL 300 MG PO TABS: 150.0000 mg | ORAL_TABLET | Freq: Every day | ORAL | 3 refills | Status: DC

## 2018-09-20 MED ORDER — TIZANIDINE HCL 4 MG PO TABS: 4.0000 mg | ORAL_TABLET | Freq: Four times a day (QID) | ORAL | 0 refills | Status: DC | PRN

## 2018-09-21 ENCOUNTER — Telehealth: Payer: Self-pay

## 2018-09-21 LAB — BASIC METABOLIC PANEL
BUN/Creatinine Ratio: 13 (ref 10–24)
BUN: 27 mg/dL (ref 8–27)
CO2: 20 mmol/L (ref 20–29)
Calcium: 9.6 mg/dL (ref 8.6–10.2)
Chloride: 106 mmol/L (ref 96–106)
Creatinine, Ser: 2.15 mg/dL — ABNORMAL HIGH (ref 0.76–1.27)
GFR calc non Af Amer: 31 mL/min/{1.73_m2} — ABNORMAL LOW (ref >59)
GFR, EST AFRICAN AMERICAN: 36 mL/min/{1.73_m2} — AB (ref >59)
Glucose: 86 mg/dL (ref 65–99)
POTASSIUM: 4.5 mmol/L (ref 3.5–5.2)
Sodium: 139 mmol/L (ref 134–144)

## 2018-09-21 LAB — CBC WITH DIFFERENTIAL/PLATELET
Basophils Absolute: 0.1 x10E3/uL (ref 0.0–0.2)
Basos: 1 %
EOS (ABSOLUTE): 0.3 x10E3/uL (ref 0.0–0.4)
Eos: 5 %
Hematocrit: 40.3 % (ref 37.5–51.0)
Hemoglobin: 13.4 g/dL (ref 13.0–17.7)
Immature Grans (Abs): 0 x10E3/uL (ref 0.0–0.1)
Immature Granulocytes: 0 %
Lymphocytes Absolute: 1.5 x10E3/uL (ref 0.7–3.1)
Lymphs: 27 %
MCH: 34.6 pg — ABNORMAL HIGH (ref 26.6–33.0)
MCHC: 33.3 g/dL (ref 31.5–35.7)
MCV: 104 fL — ABNORMAL HIGH (ref 79–97)
Monocytes Absolute: 0.6 x10E3/uL (ref 0.1–0.9)
Monocytes: 11 %
Neutrophils Absolute: 3.1 x10E3/uL (ref 1.4–7.0)
Neutrophils: 56 %
Platelets: 191 x10E3/uL (ref 150–450)
RBC: 3.87 x10E6/uL — ABNORMAL LOW (ref 4.14–5.80)
RDW: 12.4 % (ref 11.6–15.4)
WBC: 5.6 x10E3/uL (ref 3.4–10.8)

## 2018-09-21 NOTE — Telephone Encounter (Signed)
-----   Message from Argentina Donovan, [place] sent at 09/21/2018 11:02 AM EST ----- Please call patient.  Kidney function is impaired.  He should drink water to stay hydrated and avoid drugs and alcohol.  We will continue to check this for improvement.  Follow-up as planned.  Thanks, Freeman Caldron, PA-C.

## 2018-09-21 NOTE — Telephone Encounter (Signed)
CMA attempt to reach patient to inform on results.  No answer and left a VM.  

## 2018-10-20 ENCOUNTER — Other Ambulatory Visit: Payer: Self-pay | Admitting: Physician Assistant

## 2018-10-20 DIAGNOSIS — G8929 Other chronic pain: Secondary | ICD-10-CM

## 2018-10-20 DIAGNOSIS — M25511 Pain in right shoulder: Principal | ICD-10-CM

## 2018-11-01 ENCOUNTER — Encounter: Payer: Self-pay | Admitting: Family Medicine

## 2018-11-01 ENCOUNTER — Other Ambulatory Visit: Payer: Self-pay

## 2018-11-01 ENCOUNTER — Ambulatory Visit: Payer: Medicaid Other | Attending: Family Medicine | Admitting: Family Medicine

## 2018-11-01 DIAGNOSIS — I5042 Chronic combined systolic (congestive) and diastolic (congestive) heart failure: Secondary | ICD-10-CM | POA: Diagnosis not present

## 2018-11-01 DIAGNOSIS — M10472 Other secondary gout, left ankle and foot: Secondary | ICD-10-CM

## 2018-11-01 DIAGNOSIS — I10 Essential (primary) hypertension: Secondary | ICD-10-CM | POA: Diagnosis not present

## 2018-11-01 DIAGNOSIS — G8929 Other chronic pain: Secondary | ICD-10-CM

## 2018-11-01 DIAGNOSIS — M25511 Pain in right shoulder: Secondary | ICD-10-CM | POA: Diagnosis not present

## 2018-11-01 MED ORDER — ALLOPURINOL 300 MG PO TABS: 150.0000 mg | ORAL_TABLET | Freq: Every day | ORAL | 1 refills | Status: DC

## 2018-11-01 MED ORDER — DIGOXIN 62.5 MCG PO TABS: 0.1250 mg | ORAL_TABLET | Freq: Every day | ORAL | 1 refills | Status: DC

## 2018-11-01 MED ORDER — SACUBITRIL-VALSARTAN 24-26 MG PO TABS: 1.0000 | ORAL_TABLET | Freq: Two times a day (BID) | ORAL | 1 refills | Status: DC

## 2018-11-01 MED ORDER — CARVEDILOL 3.125 MG PO TABS: 3.1250 mg | ORAL_TABLET | Freq: Two times a day (BID) | ORAL | 1 refills | Status: DC

## 2018-11-01 MED ORDER — MISC. DEVICES MISC: 0 refills | Status: DC

## 2018-11-01 MED ORDER — GABAPENTIN 300 MG PO CAPS: 600.0000 mg | ORAL_CAPSULE | Freq: Two times a day (BID) | ORAL | 1 refills | Status: DC

## 2018-11-01 MED ORDER — FUROSEMIDE 40 MG PO TABS: 40.0000 mg | ORAL_TABLET | Freq: Two times a day (BID) | ORAL | 1 refills | Status: DC

## 2018-11-01 NOTE — Progress Notes (Signed)
Virtual Visit via Telephone Note  I connected with [name], on 11/01/2018 at 10:30am by telephone and verified that I am speaking with the correct person using two identifiers.   Consent: I discussed the limitations, risks, security and privacy concerns of performing an evaluation and management service by telephone and the availability of in person appointments. I also discussed with the patient that there may be a patient responsible charge related to this service. The patient expressed understanding and agreed to proceed.    Location of Patient: Patient's home  Location of Provider: Clinic  Persons participating in Telemedicine visit: [place] Djibril Glogowski - CMA Dr Margarita Rana - PCP     History of Present Illness: [name] is a 63 year old male with a history of CHF (EF 35-40% in 08/2018 improved from 20-25% from 2-D echo of 05/2016), chronic kidney disease stage III, Gout, dermatitis who presents today for a follow-up visit. He is requesting refill of his steroid cream which he uses for a chronic generalized rash which he has had. Denies dyspnea, weight gain, pedal edema, chest pain but does have dyspnea on going up stairs. States he recently had a visit with his Cardiologist- Dr Terrence Dupont. He has had no Gout flares  He has no acute concerns today.  Observations/Objective:  CMP Latest Ref Rng & Units 09/20/2018 09/07/2018 09/06/2018  Glucose 65 - 99 mg/dL 86 96 104(H)  BUN 8 - 27 mg/dL 27 33(H) 42(H)  Creatinine 0.76 - 1.27 mg/dL 2.15(H) 2.13(H) 3.44(H)  Sodium 134 - 144 mmol/L 139 137 138  Potassium 3.5 - 5.2 mmol/L 4.5 4.6 4.9  Chloride 96 - 106 mmol/L 106 107 108  CO2 20 - 29 mmol/L 20 21(L) 22  Calcium 8.6 - 10.2 mg/dL 9.6 8.3(L) 8.5(L)  Total Protein 6.5 - 8.1 g/dL - 5.7(L) -  Total Bilirubin 0.3 - 1.2 mg/dL - 0.4 -  Alkaline Phos 38 - 126 U/L - 61 -  AST 15 - 41 U/L - 35 -  ALT 0 - 44 U/L - 23 -    Assessment and Plan: 1. Other secondary acute  gout of left ankle No acute flare - allopurinol (ZYLOPRIM) 300 MG tablet; Take 0.5 tablets (150 mg total) by mouth daily.  Dispense: 90 tablet; Refill: 1  2. Benign essential HTN Advised to keep a log of home blood pressures Written prescription for BP monitor Counseled on blood pressure goal of less than 130/80, low-sodium, DASH diet, medication compliance, 150 minutes of moderate intensity exercise per week. Discussed medication compliance, adverse effects. - carvedilol (COREG) 3.125 MG tablet; Take 1 tablet (3.125 mg total) by mouth 2 (two) times daily for 30 days.  Dispense: 180 tablet; Refill: 1  3. Chronic combined systolic and diastolic congestive heart failure (HCC) Euvolemic EF 35-40% - Digoxin 62.5 MCG TABS; Take 0.125 mg by mouth daily for 30 days.  Dispense: 90 tablet; Refill: 1 - furosemide (LASIX) 40 MG tablet; Take 1 tablet (40 mg total) by mouth 2 (two) times daily for 30 days.  Dispense: 180 tablet; Refill: 1  4. Chronic right shoulder pain Stable - gabapentin (NEURONTIN) 300 MG capsule; Take 2 capsules (600 mg total) by mouth 2 (two) times daily.  Dispense: 360 capsule; Refill: 1   Follow Up Instructions: Return in about 3 months (around 01/31/2019).    I discussed the assessment and treatment plan with the patient. The patient was provided an opportunity to ask questions and all were answered. The patient agreed with the  plan and demonstrated an understanding of the instructions.   The patient was advised to call back or seek an in-person evaluation if the symptoms worsen or if the condition fails to improve as anticipated.     I provided 15 minutes total of non-face-to-face time during this encounter including median intraservice time, reviewing previous notes, labs, imaging, medications and explaining diagnosis and management.     Charlott Rakes, MD, FAAFP. [facility] and [place], [place]   11/01/2018, 5:00  PM

## 2018-11-01 NOTE — Progress Notes (Signed)
Patient has been called and DOB has been verified. Patient has been screened and transferred to PCP to start phone visit.  C/C: CHF  Refills: all medications.

## 2018-11-12 ENCOUNTER — Telehealth: Payer: Self-pay

## 2018-11-12 NOTE — Telephone Encounter (Signed)

## 2018-11-13 ENCOUNTER — Other Ambulatory Visit: Payer: Self-pay | Admitting: Family Medicine

## 2018-11-13 DIAGNOSIS — G8929 Other chronic pain: Secondary | ICD-10-CM

## 2018-11-13 DIAGNOSIS — M25511 Pain in right shoulder: Principal | ICD-10-CM

## 2018-11-16 ENCOUNTER — Telehealth: Payer: Self-pay | Admitting: Family Medicine

## 2018-11-16 NOTE — Telephone Encounter (Signed)
Can you please perform a PA for [facility] for this patient? I received a letter indicating medicaid denial. Thank you.

## 2018-11-17 ENCOUNTER — Other Ambulatory Visit: Payer: Self-pay | Admitting: Family Medicine

## 2018-11-17 DIAGNOSIS — M25511 Pain in right shoulder: Principal | ICD-10-CM

## 2018-11-17 DIAGNOSIS — G8929 Other chronic pain: Secondary | ICD-10-CM

## 2018-11-20 ENCOUNTER — Telehealth: Payer: Self-pay | Admitting: Cardiology

## 2018-11-21 ENCOUNTER — Telehealth (INDEPENDENT_AMBULATORY_CARE_PROVIDER_SITE_OTHER): Payer: Medicaid Other | Admitting: Cardiology

## 2018-11-21 ENCOUNTER — Encounter: Payer: Self-pay | Admitting: Cardiology

## 2018-11-21 ENCOUNTER — Telehealth: Payer: Self-pay | Admitting: *Deleted

## 2018-11-21 VITALS — Ht 68.0 in | Wt 191.0 lb

## 2018-11-21 DIAGNOSIS — I5042 Chronic combined systolic (congestive) and diastolic (congestive) heart failure: Secondary | ICD-10-CM

## 2018-11-21 DIAGNOSIS — I428 Other cardiomyopathies: Secondary | ICD-10-CM

## 2018-11-21 DIAGNOSIS — R55 Syncope and collapse: Secondary | ICD-10-CM

## 2018-11-21 DIAGNOSIS — F1910 Other psychoactive substance abuse, uncomplicated: Secondary | ICD-10-CM

## 2018-11-21 NOTE — Patient Instructions (Addendum)
Medication Instructions:  Not needed If you need a refill on your cardiac medications before your next appointment, please call your pharmacy.   Lab work: Not needed If you have labs (blood work) drawn today and your tests are completely normal, you will receive your results only by: Marland Kitchen MyChart Message (if you have MyChart) OR . A paper copy in the mail If you have any lab test that is abnormal or we need to change your treatment, we will call you to review the results.  Testing/Procedures:   Follow-Up: At [facility], you and your health needs are our priority.  As part of our continuing mission to provide you with exceptional heart care, we have created designated Provider Care Teams.  These Care Teams include your primary Cardiologist (physician) and Advanced Practice Providers (APPs -  Physician Assistants and Nurse Practitioners) who all work together to provide you with the care you need, when you need it. You will need a follow up appointment on an as needed basis- continue to see Dr Terrence Dupont          .   Any Other Special Instructions Will Be Listed Below (If Applicable).

## 2018-11-21 NOTE — Progress Notes (Signed)
Virtual Visit via Telephone Note   This visit type was conducted due to national recommendations for restrictions regarding the COVID-19 Pandemic (e.g. social distancing) in an effort to limit this patient's exposure and mitigate transmission in our community.  Due to his co-morbid illnesses, this patient is at least at moderate risk for complications without adequate follow up.  This format is felt to be most appropriate for this patient at this time.  The patient did not have access to video technology/had technical difficulties with video requiring transitioning to audio format only (telephone).  All issues noted in this document were discussed and addressed.  No physical exam could be performed with this format.  Please refer to the patient's chart for his  consent to telehealth for [facility].   Patient does not have video technology  Patient has given verbal permission to conduct this visit via virtual appointment and to bill insurance 11/21/2018 2:23 PM     Evaluation Performed:  Follow-up visit  Date:  11/21/2018   ID:  [name], DOB 07/01/1956, MRN [id number]  Patient Location: Home Provider Location: Home  PCP:  Charlott Rakes, MD  Cardiologist:  No primary care provider on file.  Continues to be followed by Dr. Terrence Dupont Electrophysiologist:  None   Chief Complaint: Hospital follow-up, nonischemic cardiomyopathy --> This appointment was scheduled in error.  History of Present Illness:    [name] is a 63 y.o. male with PMH notable for nonischemic cardiomyopathy (thought to be related to cocaine with history of ACS from cocaine use in 2016) EF improved from 20-25% of 35-40 % who presents via audio/video conferencing for a telehealth visit today, presumably to establish new cardiology care.Marland Kitchen  [name] was recently admitted to [facility] for syncope and mild troponin elevation found to have acute renal insufficiency.   -- >Was seen by Dr. Terrence Dupont in  hospital.  [name] continued.  Furosemide down to 40 twice daily (but taking 20 mg BID), digoxin to 0.0625 mg daily and Coreg 3.125 twice daily.  [name] was stopped. -- Renal insufficiency was thought to be related to hypovolemia and he was gently hydrated.    Interval History:  I am talking to [name] today via telehealth medicine.  He seems to be doing relatively well.  Relatively poor historian overall however.  He actually has seen Dr. Terrence Dupont since his hospital stay and was mostly seeing him either this week or next.  He says that he did not make it to his visit last week because the bus was not running.  [name] says that he continues to drink beer and has used cocaine albeit not recently.  He says that every now and then he will have some [name] feelings in his chest is noted to be probably needs to drink less beer.  He says that he gets short of breath walking longer distances or walking up hills but not with routine activity around the house.  No real chest pain or pressure with rest or exertion. He says couple times a month he will have some symptoms orthopnea or or PND, but usually sleeps on 2 pillows.  Edema seems to be good pretty well controlled only notes it if he wears tight socks. Occasionally has some palpitations but nothing prolonged.    No recurrent symptoms of syncope or near syncope.  The patient does not have symptoms concerning for COVID-19 infection (fever, chills, cough, or new shortness of breath).  The patient is practicing social distancing.  ROS:  Please see the history of present illness.    Occasional cough.  Off & on wheezing. Otherwise review of symptoms is negative.  Past Medical History:  Diagnosis Date  . Anxiety   . Arthritis    "all over" (01/07/2015)  . CKD (chronic kidney disease), stage IV ([place])   . Depression   . Headache    "probably weekly" (01/07/2015)  . History of echocardiogram 12/2014   EF 20-25% -> improved up to EF 35 and 40% February  2020:  Marland Kitchen Homelessness   . Hypercholesterolemia   . Hypertension   . Ischemic dilated cardiomyopathy ([place])   . Noncompliance   . Nonischemic dilated cardiomyopathy ([place]) 07/2014   Initial EF was 2025% with global hypokinesis.  As of February 2020 EF up to 35-40%, GRII DD.  Global hypokinesis.  No WMA.  Relatively normal valves.  . NSTEMI (non-ST elevated myocardial infarction) [facility]) 2016   2 separate episodes in January and June.  Normal coronary arteries by cardiac catheterization.  Thought to be related to.  . Polysubstance abuse ([place])    etoh, cocaine  . Urinary hesitancy   . Walking pneumonia 07/2014   Past Surgical History:  Procedure Laterality Date  . LEFT HEART CATHETERIZATION WITH CORONARY ANGIOGRAM N/A 08/15/2014   Procedure: LEFT HEART CATHETERIZATION WITH CORONARY ANGIOGRAM;  Surgeon: Clent Demark, MD;  Location: [facility] CATH LAB;  Service: Cardiovascular;; normal coronary arteries.  Severely elevated LVEDP of 35 mmHg.  Marland Kitchen TRANSTHORACIC ECHOCARDIOGRAM  08/2018   EF 35-40% (up from 20-25% in 2017).  No MR WMA.  Global HK.  GRII DD.  Normal valves.     Current Meds  Medication Sig  . allopurinol (ZYLOPRIM) 300 MG tablet Take 0.5 tablets (150 mg total) by mouth daily.  Marland Kitchen aspirin 81 MG EC tablet Take 1 tablet (81 mg total) by mouth daily.  Marland Kitchen b complex vitamins tablet Take 1 tablet by mouth daily.  . carvedilol (COREG) 3.125 MG tablet Take 1 tablet (3.125 mg total) by mouth 2 (two) times daily for 30 days.  . clotrimazole-betamethasone (LOTRISONE) cream Apply 1 application topically 2 (two) times daily.  . furosemide (LASIX) 40 MG tablet Take 1 tablet (40 mg total) by mouth 2 (two) times daily for 30 days.  Marland Kitchen gabapentin (NEURONTIN) 300 MG capsule Take 2 capsules (600 mg total) by mouth 2 (two) times daily.  . Misc. Devices MISC Blood pressure monitor Dx: Hypertension, CHF  . nitroGLYCERIN (NITROSTAT) 0.4 MG SL tablet Place 1 tablet (0.4 mg total) under the tongue every 5 (five)  minutes x 3 doses as needed for chest pain.  . potassium chloride SA (K-DUR,KLOR-CON) 20 MEQ tablet Take 20 mEq by mouth daily.  . sacubitril-valsartan (ENTRESTO) 24-26 MG Take 1 tablet by mouth 2 (two) times daily.  Marland Kitchen tiZANidine (ZANAFLEX) 4 MG tablet Take 1 tablet (4 mg total) by mouth every 6 (six) hours as needed for muscle spasms.  . [DISCONTINUED] Digoxin 62.5 MCG TABS Take 0.125 mg by mouth daily for 30 days.     Allergies:   Tape   Social History   Tobacco Use  . Smoking status: Former Smoker    Packs/day: 1.50    Years: 30.00    Pack years: 45.00    Types: Cigarettes    Last attempt to quit: 02/19/2004    Years since quitting: 14.7  . Smokeless tobacco: Former Systems developer  . Tobacco comment: quit 2005  Substance Use Topics  . Alcohol use: Yes  Alcohol/week: 2.0 - 4.0 standard drinks    Types: 2 - 4 Cans of beer per week    Comment: once weekly  . Drug use: No    Comment: former     Family Hx: The patient's family history is not on file.   Prior CV studies:   The following studies were reviewed today: . Echo 08/2018: EF improved up to 35-40% from 20-25% in 2017.  No regional wall motion normality.  Global hypokinesis.  Pseudo-normal diastolic function (GRII DD).  Relatively normal valves.  Labs/Other Tests and Data Reviewed:    EKG:  No ECG reviewed.  Recent Labs: 09/07/2018: ALT 23 09/20/2018: BUN 27; Creatinine, Ser 2.15; Hemoglobin 13.4; Platelets 191; Potassium 4.5; Sodium 139   Recent Lipid Panel Lab Results  Component Value Date/Time   CHOL 111 07/31/2016 03:05 PM   TRIG 37 07/31/2016 03:05 PM   HDL 51 07/31/2016 03:05 PM   CHOLHDL 2.2 07/31/2016 03:05 PM   LDLCALC 53 07/31/2016 03:05 PM    Wt Readings from Last 3 Encounters:  11/21/18 191 lb (86.6 kg)  09/20/18 192 lb (87.1 kg)  09/07/18 188 lb 4.8 oz (85.4 kg)     Objective:    Vital Signs:  Ht 5\' 8"  (1.727 m)   Wt 191 lb (86.6 kg)   BMI 29.04 kg/m   No acute distress. A&O x 3.  Normal Mood &  Affect Non-labored respirations Poor historian  ASSESSMENT & PLAN:    Problem List Items Addressed This Visit    Chronic combined systolic and diastolic congestive heart failure (HCC) (Chronic)   Non-ischemic cardiomyopathy (HCC) - Primary (Chronic)   Polysubstance abuse (HCC) (Chronic)   Syncope and collapse (Chronic)     [place] was scheduled for this visit probably erroneously.  He indicated that he is still following up with Dr. Terrence Dupont who is his primary cardiologist.  I think he is on a good regimen from a cardiomyopathy standpoint with Entresto and carvedilol along with low-dose digoxin.  He is on a stable dose of Lasix, I think he is only taking 20 mg twice daily which in light of his recent hypotension dehydration related syncopal episode, it is probably reasonable.  Probably he is having class II CHF symptoms, and may need some instruction on sliding scale Lasix.  I think a lot of his symptoms however are driven by continued substance abuse both alcohol and cocaine.  I talked to him about the importance of minimizing alcohol use and abstaining from cocaine.  I talked about the potential adverse interactions with his cardiac medications including carvedilol.  I am not sure how much of this he was able to acknowledge and understand.  I simply think he needs to continue to follow-up with Dr. Terrence Dupont who clearly has an established relationship with the patient.  I would not make any changes to the regimen that he is currently on.   COVID-19 Education: The signs and symptoms of COVID-19 were discussed with the patient and how to seek care for testing (follow up with PCP or arrange E-visit).   The importance of social distancing was discussed today.  Time:   Today, I have spent 14 minutes with the patient with telehealth technology discussing the above problems.     Medication Adjustments/Labs and Tests Ordered: Current medicines are reviewed at length with the patient today.   Concerns regarding medicines are outlined above.  Medication Instructions:    No changes.  Is on good medication.  We discussed possibly  taking additional PRN dose of Lasix.  Tests Ordered: No orders of the defined types were placed in this encounter. None  Medication Changes: No orders of the defined types were placed in this encounter. None  Disposition:  Follow up As planned with Dr. Terrence Dupont.  No need to follow-up with [place].    Signed, Glenetta Hew, MD  11/21/2018 2:23 PM    [place]

## 2018-11-21 NOTE — Telephone Encounter (Signed)
SPOKE TO PATIENT INSTRUCTION OF TELE-VISIT WITH DR HARDING . NO CHANGES - PATIENT WILL FOLLOW UP WITH DR Terrence Dupont ( ALREADY ESTABLISH )  AVS SUMMARY WILL BE MAILED PATIENT VOICED UNDERSTANDING

## 2018-12-11 ENCOUNTER — Other Ambulatory Visit: Payer: Self-pay | Admitting: Physician Assistant

## 2018-12-11 DIAGNOSIS — L309 Dermatitis, unspecified: Secondary | ICD-10-CM

## 2018-12-12 ENCOUNTER — Other Ambulatory Visit: Payer: Self-pay | Admitting: Family Medicine

## 2018-12-12 DIAGNOSIS — I5042 Chronic combined systolic (congestive) and diastolic (congestive) heart failure: Secondary | ICD-10-CM

## 2019-01-07 ENCOUNTER — Other Ambulatory Visit: Payer: Self-pay | Admitting: Family Medicine

## 2019-01-07 DIAGNOSIS — G8929 Other chronic pain: Secondary | ICD-10-CM

## 2019-01-14 NOTE — Telephone Encounter (Signed)
Opened in error

## 2019-02-04 ENCOUNTER — Other Ambulatory Visit: Payer: Self-pay | Admitting: Family Medicine

## 2019-02-04 DIAGNOSIS — L309 Dermatitis, unspecified: Secondary | ICD-10-CM

## 2019-02-22 ENCOUNTER — Other Ambulatory Visit: Payer: Self-pay | Admitting: Family Medicine

## 2019-02-22 DIAGNOSIS — G8929 Other chronic pain: Secondary | ICD-10-CM

## 2019-03-16 ENCOUNTER — Other Ambulatory Visit: Payer: Self-pay | Admitting: Family Medicine

## 2019-03-16 DIAGNOSIS — I10 Essential (primary) hypertension: Secondary | ICD-10-CM

## 2019-05-07 ENCOUNTER — Other Ambulatory Visit: Payer: Self-pay | Admitting: Family Medicine

## 2019-05-07 DIAGNOSIS — G8929 Other chronic pain: Secondary | ICD-10-CM

## 2019-05-07 DIAGNOSIS — M10472 Other secondary gout, left ankle and foot: Secondary | ICD-10-CM

## 2019-05-07 DIAGNOSIS — M25511 Pain in right shoulder: Secondary | ICD-10-CM

## 2019-05-14 DIAGNOSIS — I5042 Chronic combined systolic (congestive) and diastolic (congestive) heart failure: Secondary | ICD-10-CM | POA: Diagnosis not present

## 2019-05-14 DIAGNOSIS — Z79899 Other long term (current) drug therapy: Secondary | ICD-10-CM | POA: Diagnosis not present

## 2019-05-14 DIAGNOSIS — I25118 Atherosclerotic heart disease of native coronary artery with other forms of angina pectoris: Secondary | ICD-10-CM | POA: Diagnosis not present

## 2019-05-14 DIAGNOSIS — J449 Chronic obstructive pulmonary disease, unspecified: Secondary | ICD-10-CM | POA: Diagnosis not present

## 2019-05-14 DIAGNOSIS — M545 Low back pain: Secondary | ICD-10-CM | POA: Diagnosis not present

## 2019-05-14 DIAGNOSIS — Z23 Encounter for immunization: Secondary | ICD-10-CM | POA: Diagnosis not present

## 2019-05-14 DIAGNOSIS — Z0000 Encounter for general adult medical examination without abnormal findings: Secondary | ICD-10-CM | POA: Diagnosis not present

## 2019-05-20 DIAGNOSIS — I5020 Unspecified systolic (congestive) heart failure: Secondary | ICD-10-CM | POA: Diagnosis not present

## 2019-05-20 DIAGNOSIS — I10 Essential (primary) hypertension: Secondary | ICD-10-CM | POA: Diagnosis not present

## 2019-05-20 DIAGNOSIS — N189 Chronic kidney disease, unspecified: Secondary | ICD-10-CM | POA: Diagnosis not present

## 2019-05-20 DIAGNOSIS — I420 Dilated cardiomyopathy: Secondary | ICD-10-CM | POA: Diagnosis not present

## 2019-05-20 DIAGNOSIS — I2510 Atherosclerotic heart disease of native coronary artery without angina pectoris: Secondary | ICD-10-CM | POA: Diagnosis not present

## 2019-05-27 ENCOUNTER — Other Ambulatory Visit: Payer: Self-pay | Admitting: Family Medicine

## 2019-05-27 DIAGNOSIS — G8929 Other chronic pain: Secondary | ICD-10-CM

## 2019-06-17 ENCOUNTER — Other Ambulatory Visit: Payer: Self-pay | Admitting: Family Medicine

## 2019-06-17 DIAGNOSIS — M25511 Pain in right shoulder: Secondary | ICD-10-CM

## 2019-06-17 DIAGNOSIS — G8929 Other chronic pain: Secondary | ICD-10-CM

## 2019-07-06 ENCOUNTER — Other Ambulatory Visit: Payer: Self-pay

## 2019-07-06 ENCOUNTER — Emergency Department (HOSPITAL_COMMUNITY)
Admission: EM | Admit: 2019-07-06 | Discharge: 2019-07-06 | Disposition: A | Discharge status: Home / Self Care | Payer: Medicare Other | Attending: Emergency Medicine | Admitting: Emergency Medicine

## 2019-07-06 ENCOUNTER — Encounter (HOSPITAL_COMMUNITY): Payer: Self-pay | Admitting: Emergency Medicine

## 2019-07-06 DIAGNOSIS — N184 Chronic kidney disease, stage 4 (severe): Secondary | ICD-10-CM | POA: Insufficient documentation

## 2019-07-06 DIAGNOSIS — U071 COVID-19: Secondary | ICD-10-CM | POA: Diagnosis not present

## 2019-07-06 DIAGNOSIS — I130 Hypertensive heart and chronic kidney disease with heart failure and stage 1 through stage 4 chronic kidney disease, or unspecified chronic kidney disease: Secondary | ICD-10-CM | POA: Diagnosis not present

## 2019-07-06 DIAGNOSIS — I5042 Chronic combined systolic (congestive) and diastolic (congestive) heart failure: Secondary | ICD-10-CM | POA: Insufficient documentation

## 2019-07-06 DIAGNOSIS — R531 Weakness: Secondary | ICD-10-CM | POA: Diagnosis present

## 2019-07-06 DIAGNOSIS — Z7982 Long term (current) use of aspirin: Secondary | ICD-10-CM | POA: Diagnosis not present

## 2019-07-06 DIAGNOSIS — Z79899 Other long term (current) drug therapy: Secondary | ICD-10-CM | POA: Diagnosis not present

## 2019-07-06 LAB — URINALYSIS, ROUTINE W REFLEX MICROSCOPIC
Bilirubin Urine: NEGATIVE
Glucose, UA: NEGATIVE mg/dL
Hgb urine dipstick: NEGATIVE
Ketones, ur: NEGATIVE mg/dL
Nitrite: NEGATIVE
Protein, ur: NEGATIVE mg/dL
Specific Gravity, Urine: 1.012 (ref 1.005–1.030)
pH: 5 (ref 5.0–8.0)

## 2019-07-06 LAB — CBC
HCT: 38.8 % — ABNORMAL LOW (ref 39.0–52.0)
Hemoglobin: 12.7 g/dL — ABNORMAL LOW (ref 13.0–17.0)
MCH: 35.8 pg — ABNORMAL HIGH (ref 26.0–34.0)
MCHC: 32.7 g/dL (ref 30.0–36.0)
MCV: 109.3 fL — ABNORMAL HIGH (ref 80.0–100.0)
Platelets: 133 10*3/uL — ABNORMAL LOW (ref 150–400)
RBC: 3.55 MIL/uL — ABNORMAL LOW (ref 4.22–5.81)
RDW: 12 % (ref 11.5–15.5)
WBC: 3.7 10*3/uL — ABNORMAL LOW (ref 4.0–10.5)
nRBC: 0 % (ref 0.0–0.2)

## 2019-07-06 LAB — BASIC METABOLIC PANEL
Anion gap: 11 (ref 5–15)
BUN: 31 mg/dL — ABNORMAL HIGH (ref 8–23)
CO2: 20 mmol/L — ABNORMAL LOW (ref 22–32)
Calcium: 8.8 mg/dL — ABNORMAL LOW (ref 8.9–10.3)
Chloride: 107 mmol/L (ref 98–111)
Creatinine, Ser: 3.09 mg/dL — ABNORMAL HIGH (ref 0.61–1.24)
GFR calc Af Amer: 23 mL/min — ABNORMAL LOW (ref >60)
GFR calc non Af Amer: 20 mL/min — ABNORMAL LOW (ref >60)
Glucose, Bld: 110 mg/dL — ABNORMAL HIGH (ref 70–99)
Potassium: 4.1 mmol/L (ref 3.5–5.1)
Sodium: 138 mmol/L (ref 135–145)

## 2019-07-06 LAB — POC SARS CORONAVIRUS 2 AG -  ED: SARS Coronavirus 2 Ag: NEGATIVE

## 2019-07-06 MED ORDER — SODIUM CHLORIDE 0.9 % IV BOLUS
1000.0000 mL | Freq: Once | INTRAVENOUS | Status: AC
  Administered 2019-07-06: 1000 mL via INTRAVENOUS

## 2019-07-06 NOTE — Discharge Instructions (Addendum)
Take entresto ONE tablet twice a day.

## 2019-07-06 NOTE — ED Triage Notes (Signed)
Per [facility] pt coming from home c/o weakness and diarrhea over the past week. EMS states pt was hypotensive and given 300CC NS. C/o left upper quadrant abdominal pain. Patient adds he was having chest pain earlier today and took 1 nitro and 81mg  aspirin. No chest pain at this time.

## 2019-07-06 NOTE — ED Provider Notes (Signed)
[facility] EMERGENCY DEPARTMENT Provider Note   CSN: [id number] Arrival date & time: 07/06/19  1730     History Chief Complaint  Patient presents with  . Weakness    [name] is a 63 y.o. male.  HPI   63 year old male with primarily generalized weakness. Reports some intermittent loose stools. Symptoms began about a week ago. He feels like it is has no energy. Some achy pain in his upper abdomen. No fevers or chills. No acute respiratory complaints. No nausea or vomiting. No urinary complaints. No sick contacts he is aware of.  Past Medical History:  Diagnosis Date  . Anxiety   . Arthritis    "all over" (01/07/2015)  . CKD (chronic kidney disease), stage IV ([place])   . Depression   . Headache    "probably weekly" (01/07/2015)  . History of echocardiogram 12/2014   EF 20-25% -> improved up to EF 35 and 40% February 2020:  Marland Kitchen Homelessness   . Hypercholesterolemia   . Hypertension   . Ischemic dilated cardiomyopathy ([place])   . Noncompliance   . Nonischemic dilated cardiomyopathy ([place]) 07/2014   Initial EF was 2025% with global hypokinesis.  As of February 2020 EF up to 35-40%, GRII DD.  Global hypokinesis.  No WMA.  Relatively normal valves.  . NSTEMI (non-ST elevated myocardial infarction) [facility]) 2016   2 separate episodes in January and June.  Normal coronary arteries by cardiac catheterization.  Thought to be related to.  . Polysubstance abuse ([place])    etoh, cocaine  . Urinary hesitancy   . Walking pneumonia 07/2014    Patient Active Problem List   Diagnosis Date Noted  . Non-ischemic cardiomyopathy ([place]) 11/21/2018  . Syncope and collapse 09/06/2018  . Neuropathy 03/03/2017  . Lower urinary tract symptoms 01/06/2017  . Acute on chronic systolic heart failure ([place]) 07/05/2016  . Macrocytosis without anemia 07/05/2016  . Hypotension 06/14/2016  . Generalized anxiety disorder 06/13/2016  . Dermatitis 01/27/2016  . Troponin level elevated   .  CKD (chronic kidney disease) stage 3, GFR 30-59 ml/min ([place]) 05/03/2015  . Chronic combined systolic and diastolic congestive heart failure ([place]) 05/03/2015  . Benign essential HTN 05/03/2015  . Polysubstance abuse ([place])   . Noncompliance   . Gout 03/24/2015    Past Surgical History:  Procedure Laterality Date  . LEFT HEART CATHETERIZATION WITH CORONARY ANGIOGRAM N/A 08/15/2014   Procedure: LEFT HEART CATHETERIZATION WITH CORONARY ANGIOGRAM;  Surgeon: Clent Demark, MD;  Location: [facility] CATH LAB;  Service: Cardiovascular;; normal coronary arteries.  Severely elevated LVEDP of 35 mmHg.  Marland Kitchen TRANSTHORACIC ECHOCARDIOGRAM  08/2018   EF 35-40% (up from 20-25% in 2017).  No MR WMA.  Global HK.  GRII DD.  Normal valves.       No family history on file.  Social History   Tobacco Use  . Smoking status: Former Smoker    Packs/day: 1.50    Years: 30.00    Pack years: 45.00    Types: Cigarettes    Quit date: 02/19/2004    Years since quitting: 15.3  . Smokeless tobacco: Former Systems developer  . Tobacco comment: quit 2005  Substance Use Topics  . Alcohol use: Yes    Alcohol/week: 2.0 - 4.0 standard drinks    Types: 2 - 4 Cans of beer per week    Comment: once weekly  . Drug use: No    Comment: former    Home Medications Prior to Admission medications  Medication Sig Start Date End Date Taking? Authorizing Provider  allopurinol (ZYLOPRIM) 300 MG tablet Take 0.5 tablets (150 mg total) by mouth daily. 05/08/19  Yes Charlott Rakes, MD  aspirin 81 MG EC tablet Take 1 tablet (81 mg total) by mouth daily. 01/06/17  Yes Charlott Rakes, MD  b complex vitamins tablet Take 1 tablet by mouth daily.   Yes [provider]  tiZANidine (ZANAFLEX) 4 MG tablet Take 1 tablet (4 mg total) by mouth every 6 (six) hours as needed for muscle spasms. MUST MAKE APPT FOR FURTHER REFILLS 02/22/19  Yes Charlott Rakes, MD  carvedilol (COREG) 3.125 MG tablet Take 1 tablet (3.125 mg total) by mouth 2 (two) times daily.  Must have office visit for refills 03/18/19 04/17/19  Charlott Rakes, MD  clotrimazole-betamethasone (LOTRISONE) cream Apply 1 application topically 2 (two) times daily. 02/04/19   Charlott Rakes, MD  ENTRESTO 49-51 MG Take 1 tablet by mouth 2 (two) times daily. 07/01/19   [provider]  furosemide (LASIX) 40 MG tablet Take 1 tablet (40 mg total) by mouth 2 (two) times daily for 30 days. 11/01/18 12/01/18  Charlott Rakes, MD  furosemide (LASIX) 80 MG tablet Take 40 mg by mouth 2 (two) times daily. 06/06/19   [provider]  gabapentin (NEURONTIN) 300 MG capsule Take 2 capsules (600 mg total) by mouth 2 (two) times daily. 05/08/19   Charlott Rakes, MD  Misc. Devices MISC Blood pressure monitor Dx: Hypertension, CHF 11/01/18   Charlott Rakes, MD  nitroGLYCERIN (NITROSTAT) 0.4 MG SL tablet Place 1 tablet (0.4 mg total) under the tongue every 5 (five) minutes x 3 doses as needed for chest pain. 01/06/17   Charlott Rakes, MD  pantoprazole (PROTONIX) 40 MG tablet Take 40 mg by mouth daily. 05/14/19   [provider]  potassium chloride SA (K-DUR,KLOR-CON) 20 MEQ tablet Take 20 mEq by mouth daily. 06/04/16   [provider]  sacubitril-valsartan (ENTRESTO) 24-26 MG Take 1 tablet by mouth 2 (two) times daily. 11/01/18   Charlott Rakes, MD    Allergies    Tape  Review of Systems   Review of Systems All systems reviewed and negative, other than as noted in HPI.  Physical Exam Updated Vital Signs BP 97/68   Pulse 65   Temp 98.6 F (37 C) (Oral)   Resp (!) 25   SpO2 99%   Physical Exam Vitals and nursing note reviewed.  Constitutional:      General: He is not in acute distress.    Appearance: He is well-developed.  HENT:     Head: Normocephalic and atraumatic.  Eyes:     General:        Right eye: No discharge.        Left eye: No discharge.     Conjunctiva/sclera: Conjunctivae normal.  Cardiovascular:     Rate and Rhythm: Normal rate and regular  rhythm.     Heart sounds: Normal heart sounds. No murmur. No friction rub. No gallop.   Pulmonary:     Effort: Pulmonary effort is normal. No respiratory distress.     Breath sounds: Normal breath sounds.  Abdominal:     General: There is no distension.     Palpations: Abdomen is soft.     Tenderness: There is no abdominal tenderness.  Musculoskeletal:        General: No tenderness.     Cervical back: Neck supple.  Skin:    General: Skin is warm and dry.  Neurological:     Mental Status: He is alert and oriented to person, place, and time.     Cranial Nerves: No cranial nerve deficit.     Sensory: No sensory deficit.     Motor: No weakness.     Coordination: Coordination normal.  Psychiatric:        Behavior: Behavior normal.        Thought Content: Thought content normal.     ED Results / Procedures / Treatments   Labs (all labs ordered are listed, but only abnormal results are displayed) Labs Reviewed  BASIC METABOLIC PANEL - Abnormal; Notable for the following components:      Result Value   CO2 20 (*)    Glucose, Bld 110 (*)    BUN 31 (*)    Creatinine, Ser 3.09 (*)    Calcium 8.8 (*)    GFR calc non Af Amer 20 (*)    GFR calc Af Amer 23 (*)    All other components within normal limits  CBC - Abnormal; Notable for the following components:   WBC 3.7 (*)    RBC 3.55 (*)    Hemoglobin 12.7 (*)    HCT 38.8 (*)    MCV 109.3 (*)    MCH 35.8 (*)    Platelets 133 (*)    All other components within normal limits  URINALYSIS, ROUTINE W REFLEX MICROSCOPIC - Abnormal; Notable for the following components:   Color, Urine AMBER (*)    APPearance HAZY (*)    Leukocytes,Ua TRACE (*)    Bacteria, UA RARE (*)    All other components within normal limits  NOVEL CORONAVIRUS, NAA (HOSP ORDER, SEND-OUT TO REF LAB; TAT 18-24 HRS)  POC SARS CORONAVIRUS 2 AG -  ED    EKG EKG Interpretation  Date/Time:  Saturday July 06 2019 17:36:10 EST Ventricular Rate:  67 PR  Interval:    QRS Duration: 194 QT Interval:  425 QTC Calculation: 449 R Axis:   -80 Text Interpretation: Sinus rhythm Borderline prolonged PR interval Right bundle branch block Confirmed by Virgel Manifold [phone]) on 07/06/2019 6:46:12 PM   Radiology No results found.   No results found.  Procedures Procedures (including critical care time)  Medications Ordered in ED Medications  sodium chloride 0.9 % bolus 1,000 mL (1,000 mLs Intravenous New Bag/Given 07/06/19 1830)    ED Course  I have reviewed the triage vital signs and the nursing notes.  Pertinent labs & imaging results that were available during my care of the patient were reviewed by me and considered in my medical decision making (see chart for details).    MDM Rules/Calculators/A&P                      63 year old male with generalized weakness. No acute focal deficits on neuro exam. Initially hypotensive for EMS but this responded to IV fluids. He is feeling somewhat better. Exam is fairly reassuring. Advised to push fluids. Outpatient follow-up otherwise. Return precautions discussed.  Of note, when reviewing patients medications pharmacy tech noted that he is actually taking two tablets of entresto twice a day. He was wondering why he was running out early. Pt advised that need to take one pill twice daily.  Final Clinical Impression(s) / ED Diagnoses Final diagnoses:  Weakness    Rx / DC Orders ED Discharge Orders    None       Virgel Manifold, MD 07/14/19 [phone]

## 2019-07-09 ENCOUNTER — Telehealth: Payer: Self-pay | Admitting: Nurse Practitioner

## 2019-07-09 ENCOUNTER — Telehealth (HOSPITAL_COMMUNITY): Payer: Self-pay

## 2019-07-09 LAB — NOVEL CORONAVIRUS, NAA (HOSP ORDER, SEND-OUT TO REF LAB; TAT 18-24 HRS): SARS-CoV-2, NAA: DETECTED — AB

## 2019-07-09 NOTE — Telephone Encounter (Signed)
Called to discuss with patient about Covid symptoms and the use of bamlanivimab, a monoclonal antibody infusion for those with mild to moderate Covid symptoms and at a high risk of hospitalization.  Pt is qualified for this infusion at the [facility] infusion center due to Chronic kidney disease   Message left to call back.

## 2019-07-13 ENCOUNTER — Other Ambulatory Visit: Payer: Self-pay | Admitting: Family Medicine

## 2019-07-13 DIAGNOSIS — I10 Essential (primary) hypertension: Secondary | ICD-10-CM

## 2019-07-26 ENCOUNTER — Other Ambulatory Visit: Payer: Self-pay | Admitting: Family Medicine

## 2019-07-26 DIAGNOSIS — I10 Essential (primary) hypertension: Secondary | ICD-10-CM

## 2019-07-29 ENCOUNTER — Other Ambulatory Visit: Payer: Self-pay | Admitting: Physician Assistant

## 2019-07-29 DIAGNOSIS — M10472 Other secondary gout, left ankle and foot: Secondary | ICD-10-CM

## 2019-09-02 ENCOUNTER — Other Ambulatory Visit: Payer: Self-pay | Admitting: Family Medicine

## 2019-09-02 DIAGNOSIS — I10 Essential (primary) hypertension: Secondary | ICD-10-CM

## 2019-10-22 ENCOUNTER — Other Ambulatory Visit: Payer: Self-pay | Admitting: Family Medicine

## 2019-10-22 DIAGNOSIS — G8929 Other chronic pain: Secondary | ICD-10-CM

## 2020-01-11 ENCOUNTER — Other Ambulatory Visit: Payer: Self-pay | Admitting: Family Medicine

## 2020-01-11 DIAGNOSIS — M25511 Pain in right shoulder: Secondary | ICD-10-CM

## 2020-02-21 ENCOUNTER — Other Ambulatory Visit: Payer: Self-pay | Admitting: Family Medicine

## 2020-02-21 DIAGNOSIS — M10472 Other secondary gout, left ankle and foot: Secondary | ICD-10-CM

## 2020-02-21 NOTE — Telephone Encounter (Signed)
Patient called, left VM to return the call to the office to schedule physical in order to continue to receive medication refills.

## 2020-02-21 NOTE — Telephone Encounter (Signed)
Requested medication (s) are due for refill today: Yes  Requested medication (s) are on the active medication list: Yes  Last refill:  05/08/19  Future visit scheduled: No  Notes to clinic:  Unable to refill due to failed appointments per protocol, no appointment in 1 year.     Requested Prescriptions  Pending Prescriptions Disp Refills   allopurinol (ZYLOPRIM) 300 MG tablet [Pharmacy Med Name: allopurinol 300 mg tablet] 90 tablet 1    Sig: Take 0.5 tablets (150 mg total) by mouth daily.      Endocrinology:  Gout Agents Failed - 02/21/2020 11:40 AM      Failed - Uric Acid in normal range and within 360 days    Uric Acid, Serum  Date Value Ref Range Status  08/25/2015 5.1 4.0 - 7.8 mg/dL Final          Failed - Cr in normal range and within 360 days    Creat  Date Value Ref Range Status  10/14/2015 1.70 (H) 0.70 - 1.25 mg/dL Final   Creatinine, Ser  Date Value Ref Range Status  07/06/2019 3.09 (H) 0.61 - 1.24 mg/dL Final   Creatinine, Urine  Date Value Ref Range Status  05/13/2015 236.29 mg/dL Final    Comment:    Performed at [facility]          Failed - Valid encounter within last 12 months    Recent Outpatient Visits           1 year ago Other secondary acute gout of left ankle   [place], Charlane Ferretti, MD   1 year ago Chronic combined systolic and diastolic congestive heart failure [facility])   [place], [place], [place]   2 years ago Chronic combined systolic and diastolic congestive heart failure [facility])   [place], Charlane Ferretti, MD   2 years ago Acute on chronic systolic heart failure [facility])   [place], Charlane Ferretti, MD   3 years ago CKD (chronic kidney disease) stage 3, GFR 30-59 ml/min   [facility] Charlott Rakes, MD

## 2020-03-11 ENCOUNTER — Other Ambulatory Visit: Payer: Self-pay | Admitting: Family Medicine

## 2020-03-11 DIAGNOSIS — M10472 Other secondary gout, left ankle and foot: Secondary | ICD-10-CM

## 2020-03-11 NOTE — Telephone Encounter (Signed)
Requested medication (s) are due for refill today: yes  Requested medication (s) are on the active medication list:yes  Future visit scheduled: no  Notes to clinic:  overdue for follow up    Requested Prescriptions  Pending Prescriptions Disp Refills   allopurinol (ZYLOPRIM) 300 MG tablet [Pharmacy Med Name: allopurinol 300 mg tablet] 90 tablet 1    Sig: Take 0.5 tablets (150 mg total) by mouth daily.      Endocrinology:  Gout Agents Failed - 03/11/2020 10:06 AM      Failed - Uric Acid in normal range and within 360 days    Uric Acid, Serum  Date Value Ref Range Status  08/25/2015 5.1 4.0 - 7.8 mg/dL Final          Failed - Cr in normal range and within 360 days    Creat  Date Value Ref Range Status  10/14/2015 1.70 (H) 0.70 - 1.25 mg/dL Final   Creatinine, Ser  Date Value Ref Range Status  07/06/2019 3.09 (H) 0.61 - 1.24 mg/dL Final   Creatinine, Urine  Date Value Ref Range Status  05/13/2015 236.29 mg/dL Final    Comment:    Performed at [facility]          Failed - Valid encounter within last 12 months    Recent Outpatient Visits           1 year ago Other secondary acute gout of left ankle   [place], Charlane Ferretti, MD   1 year ago Chronic combined systolic and diastolic congestive heart failure [facility])   [place], [place], [place]   2 years ago Chronic combined systolic and diastolic congestive heart failure [facility])   [place], Charlane Ferretti, MD   3 years ago Acute on chronic systolic heart failure [facility])   [place], Charlane Ferretti, MD   3 years ago CKD (chronic kidney disease) stage 3, GFR 30-59 ml/min   [facility] Charlott Rakes, MD

## 2020-04-06 ENCOUNTER — Other Ambulatory Visit: Payer: Self-pay | Admitting: Family Medicine

## 2020-04-06 DIAGNOSIS — M10472 Other secondary gout, left ankle and foot: Secondary | ICD-10-CM

## 2020-04-17 ENCOUNTER — Emergency Department (HOSPITAL_COMMUNITY)
Admission: EM | Admit: 2020-04-17 | Discharge: 2020-04-18 | Disposition: A | Discharge status: Home / Self Care | Payer: Medicare Other | Attending: Emergency Medicine | Admitting: Emergency Medicine

## 2020-04-17 ENCOUNTER — Encounter (HOSPITAL_COMMUNITY): Payer: Self-pay | Admitting: Emergency Medicine

## 2020-04-17 ENCOUNTER — Emergency Department (HOSPITAL_COMMUNITY): Payer: Medicare Other

## 2020-04-17 DIAGNOSIS — J189 Pneumonia, unspecified organism: Secondary | ICD-10-CM

## 2020-04-17 DIAGNOSIS — I5042 Chronic combined systolic (congestive) and diastolic (congestive) heart failure: Secondary | ICD-10-CM | POA: Diagnosis not present

## 2020-04-17 DIAGNOSIS — Z7982 Long term (current) use of aspirin: Secondary | ICD-10-CM | POA: Insufficient documentation

## 2020-04-17 DIAGNOSIS — R55 Syncope and collapse: Secondary | ICD-10-CM | POA: Insufficient documentation

## 2020-04-17 DIAGNOSIS — J181 Lobar pneumonia, unspecified organism: Secondary | ICD-10-CM | POA: Insufficient documentation

## 2020-04-17 DIAGNOSIS — N184 Chronic kidney disease, stage 4 (severe): Secondary | ICD-10-CM | POA: Insufficient documentation

## 2020-04-17 DIAGNOSIS — Z79899 Other long term (current) drug therapy: Secondary | ICD-10-CM | POA: Insufficient documentation

## 2020-04-17 DIAGNOSIS — Z87891 Personal history of nicotine dependence: Secondary | ICD-10-CM | POA: Insufficient documentation

## 2020-04-17 DIAGNOSIS — Z955 Presence of coronary angioplasty implant and graft: Secondary | ICD-10-CM | POA: Diagnosis not present

## 2020-04-17 DIAGNOSIS — I130 Hypertensive heart and chronic kidney disease with heart failure and stage 1 through stage 4 chronic kidney disease, or unspecified chronic kidney disease: Secondary | ICD-10-CM | POA: Insufficient documentation

## 2020-04-17 LAB — CBC
HCT: 45.8 % (ref 39.0–52.0)
Hemoglobin: 15.2 g/dL (ref 13.0–17.0)
MCH: 34.4 pg — ABNORMAL HIGH (ref 26.0–34.0)
MCHC: 33.2 g/dL (ref 30.0–36.0)
MCV: 103.6 fL — ABNORMAL HIGH (ref 80.0–100.0)
Platelets: 194 10*3/uL (ref 150–400)
RBC: 4.42 MIL/uL (ref 4.22–5.81)
RDW: 12.2 % (ref 11.5–15.5)
WBC: 4.4 10*3/uL (ref 4.0–10.5)
nRBC: 0 % (ref 0.0–0.2)

## 2020-04-17 LAB — BASIC METABOLIC PANEL
Anion gap: 13 (ref 5–15)
BUN: 30 mg/dL — ABNORMAL HIGH (ref 8–23)
CO2: 18 mmol/L — ABNORMAL LOW (ref 22–32)
Calcium: 9.7 mg/dL (ref 8.9–10.3)
Chloride: 105 mmol/L (ref 98–111)
Creatinine, Ser: 2.84 mg/dL — ABNORMAL HIGH (ref 0.61–1.24)
GFR calc Af Amer: 26 mL/min — ABNORMAL LOW (ref >60)
GFR calc non Af Amer: 22 mL/min — ABNORMAL LOW (ref >60)
Glucose, Bld: 112 mg/dL — ABNORMAL HIGH (ref 70–99)
Potassium: 4.2 mmol/L (ref 3.5–5.1)
Sodium: 136 mmol/L (ref 135–145)

## 2020-04-17 LAB — TROPONIN I (HIGH SENSITIVITY): Troponin I (High Sensitivity): 28 ng/L — ABNORMAL HIGH (ref <18)

## 2020-04-17 NOTE — ED Triage Notes (Signed)
Pt transported by [facility] from home after experiencing chest pressure, shob, diaphoresis, pt took nitro x 2 and had syncopal episode after bending over. Pt A & O on arrival, no CP at this time. +cardiac hx, RBBB

## 2020-04-18 DIAGNOSIS — R55 Syncope and collapse: Secondary | ICD-10-CM | POA: Diagnosis not present

## 2020-04-18 LAB — TROPONIN I (HIGH SENSITIVITY): Troponin I (High Sensitivity): 25 ng/L — ABNORMAL HIGH (ref <18)

## 2020-04-18 MED ORDER — AZITHROMYCIN 250 MG PO TABS: 250.0000 mg | ORAL_TABLET | Freq: Every day | ORAL | 0 refills | Status: AC

## 2020-04-18 MED ORDER — AMOXICILLIN-POT CLAVULANATE 875-125 MG PO TABS: 1.0000 | ORAL_TABLET | Freq: Two times a day (BID) | ORAL | 0 refills | Status: DC

## 2020-04-18 MED ORDER — AMOXICILLIN-POT CLAVULANATE 875-125 MG PO TABS
1.0000 | ORAL_TABLET | Freq: Once | ORAL | Status: AC
  Administered 2020-04-18: 1 via ORAL
  Filled 2020-04-18: qty 1

## 2020-04-18 MED ORDER — AZITHROMYCIN 250 MG PO TABS
500.0000 mg | ORAL_TABLET | Freq: Once | ORAL | Status: AC
  Administered 2020-04-18: 500 mg via ORAL
  Filled 2020-04-18: qty 2

## 2020-04-18 NOTE — ED Provider Notes (Signed)
[facility] EMERGENCY DEPARTMENT Provider Note   CSN: [id number] Arrival date & time: 04/17/20  2207     History Chief Complaint  Patient presents with  . Chest Pain    [name] is a 64 y.o. male.  HPI    Patient presents after episode of syncope that occurred yesterday. Patient currently has no lightheadedness, no chest pain, no dyspnea. Acknowledges multiple medical problems, states that he takes his medication regularly. Yesterday, the patient notes that after drinking alcohol in the morning, taking his medication, he had a prodromal episode of syncope, feeling lightheaded, woozy, then losing consciousness for some time. He notes mild ongoing cough as his only review of system positive. He has been in contact with his physician regularly as scheduled for his next visit next month. Patient notes that he drinks daily.  Past Medical History:  Diagnosis Date  . Anxiety   . Arthritis    "all over" (01/07/2015)  . CKD (chronic kidney disease), stage IV ([place])   . Depression   . Headache    "probably weekly" (01/07/2015)  . History of echocardiogram 12/2014   EF 20-25% -> improved up to EF 35 and 40% February 2020:  [place] [facility] Homelessness   . Hypercholesterolemia   . Hypertension   . Ischemic dilated cardiomyopathy ([place])   . Noncompliance   . Nonischemic dilated cardiomyopathy ([place]) 07/2014   Initial EF was 2025% with global hypokinesis.  As of February 2020 EF up to 35-40%, GRII DD.  Global hypokinesis.  No WMA.  Relatively normal valves.  . NSTEMI (non-ST elevated myocardial infarction) [facility]) 2016   2 separate episodes in January and June.  Normal coronary arteries by cardiac catheterization.  Thought to be related to.  . Polysubstance abuse ([place])    etoh, cocaine  . Urinary hesitancy   . Walking pneumonia 07/2014    Patient Active Problem List   Diagnosis Date Noted  . Non-ischemic cardiomyopathy ([place]) 11/21/2018  . Syncope and collapse 09/06/2018    . Neuropathy 03/03/2017  . Lower urinary tract symptoms 01/06/2017  . Acute on chronic systolic heart failure ([place]) 07/05/2016  . Macrocytosis without anemia 07/05/2016  . Hypotension 06/14/2016  . Generalized anxiety disorder 06/13/2016  . Dermatitis 01/27/2016  . Troponin level elevated   . CKD (chronic kidney disease) stage 3, GFR 30-59 ml/min ([place]) 05/03/2015  . Chronic combined systolic and diastolic congestive heart failure ([place]) 05/03/2015  . Benign essential HTN 05/03/2015  . Polysubstance abuse ([place])   . Noncompliance   . Gout 03/24/2015    Past Surgical History:  Procedure Laterality Date  . LEFT HEART CATHETERIZATION WITH CORONARY ANGIOGRAM N/A 08/15/2014   Procedure: LEFT HEART CATHETERIZATION WITH CORONARY ANGIOGRAM;  Surgeon: Clent Demark, MD;  Location: [facility] CATH LAB;  Service: Cardiovascular;; normal coronary arteries.  Severely elevated LVEDP of 35 mmHg.  Marland Kitchen TRANSTHORACIC ECHOCARDIOGRAM  08/2018   EF 35-40% (up from 20-25% in 2017).  No MR WMA.  Global HK.  GRII DD.  Normal valves.       No family history on file.  Social History   Tobacco Use  . Smoking status: Former Smoker    Packs/day: 1.50    Years: 30.00    Pack years: 45.00    Types: Cigarettes    Quit date: 02/19/2004    Years since quitting: 16.1  . Smokeless tobacco: Former Systems developer  . Tobacco comment: quit 2005  Substance Use Topics  . Alcohol use: Yes    Alcohol/week:  2.0 - 4.0 standard drinks    Types: 2 - 4 Cans of beer per week    Comment: once weekly  . Drug use: No    Comment: former    Home Medications Prior to Admission medications   Medication Sig Start Date End Date Taking? Authorizing Provider  allopurinol (ZYLOPRIM) 300 MG tablet Take 0.5 tablets (150 mg total) by mouth daily. 05/08/19   Charlott Rakes, MD  aspirin 81 MG EC tablet Take 1 tablet (81 mg total) by mouth daily. 01/06/17   Charlott Rakes, MD  b complex vitamins tablet Take 1 tablet by mouth daily.    [provider]  carvedilol (COREG) 3.125 MG tablet Take 1 tablet (3.125 mg total) by mouth 2 (two) times daily. Please call to make appointment. 07/15/19 08/14/19  Charlott Rakes, MD  clotrimazole-betamethasone (LOTRISONE) cream Apply 1 application topically 2 (two) times daily. 02/04/19   Charlott Rakes, MD  Doxylamine-DM (VICKS DAYQUIL/NYQUIL COUGH PO) Take 2 tablets by mouth 2 (two) times daily as needed (cough/congestion).    [provider]  furosemide (LASIX) 40 MG tablet Take 1 tablet (40 mg total) by mouth 2 (two) times daily for 30 days. Patient not taking: Reported on 07/06/2019 11/01/18 12/01/18  Charlott Rakes, MD  furosemide (LASIX) 80 MG tablet Take 40 mg by mouth 2 (two) times daily. 06/06/19   [provider]  gabapentin (NEURONTIN) 300 MG capsule Take 2 capsules (600 mg total) by mouth 2 (two) times daily. 01/13/20   Charlott Rakes, MD  guaiFENesin (MUCINEX PO) Take 1 Dose by mouth 2 (two) times daily as needed (cough/congestion).    [provider]  guaifenesin (ROBITUSSIN) 100 MG/5ML syrup Take 200 mg by mouth at bedtime as needed for cough.    [provider]  Misc. Devices MISC Blood pressure monitor Dx: Hypertension, CHF 11/01/18   Charlott Rakes, MD  nitroGLYCERIN (NITROSTAT) 0.4 MG SL tablet Place 1 tablet (0.4 mg total) under the tongue every 5 (five) minutes x 3 doses as needed for chest pain. 01/06/17   Charlott Rakes, MD  pantoprazole (PROTONIX) 40 MG tablet Take 40 mg by mouth daily. 05/14/19   [provider]  Phenyleph-CPM-DM-APAP (ALKA-SELTZER PLUS COLD & FLU PO) Take 1 Dose by mouth at bedtime as needed (cough/congestion).    [provider]  potassium chloride SA (K-DUR,KLOR-CON) 20 MEQ tablet Take 20 mEq by mouth daily. 06/04/16   [provider]  sacubitril-valsartan (ENTRESTO) 24-26 MG Take 1 tablet by mouth 2 (two) times daily. Patient not taking: Reported on 07/06/2019 11/01/18   Charlott Rakes, MD    sacubitril-valsartan (ENTRESTO) 49-51 MG Take 2 tablets by mouth 2 (two) times daily.    [provider]  tiZANidine (ZANAFLEX) 4 MG tablet Take 1 tablet (4 mg total) by mouth every 6 (six) hours as needed for muscle spasms. MUST MAKE APPT FOR FURTHER REFILLS 02/22/19   Charlott Rakes, MD    Allergies    Tape  Review of Systems   Review of Systems  Constitutional:       Per HPI, otherwise negative  HENT:       Per HPI, otherwise negative  Respiratory:       Per HPI, otherwise negative  Cardiovascular:       Per HPI, otherwise negative  Gastrointestinal: Negative for vomiting.  Endocrine:       Negative aside from HPI  Genitourinary:       Neg aside from HPI   Musculoskeletal:  Per HPI, otherwise negative  Skin: Negative.   Neurological: Positive for syncope.    Physical Exam Updated Vital Signs BP 132/83 (BP Location: Left Arm)   Pulse 73   Temp 97.8 F (36.6 C) (Oral)   Resp 15   SpO2 99%   Physical Exam Vitals and nursing note reviewed.  Constitutional:      General: He is not in acute distress.    Appearance: He is well-developed.  HENT:     Head: Normocephalic and atraumatic.  Eyes:     Conjunctiva/sclera: Conjunctivae normal.  Cardiovascular:     Rate and Rhythm: Normal rate and regular rhythm.  Pulmonary:     Effort: Pulmonary effort is normal. No respiratory distress.     Breath sounds: No stridor.  Abdominal:     General: There is no distension.  Skin:    General: Skin is warm and dry.  Neurological:     Mental Status: He is alert and oriented to person, place, and time.     ED Results / Procedures / Treatments   Labs (all labs ordered are listed, but only abnormal results are displayed) Labs Reviewed  BASIC METABOLIC PANEL - Abnormal; Notable for the following components:      Result Value   CO2 18 (*)    Glucose, Bld 112 (*)    BUN 30 (*)    Creatinine, Ser 2.84 (*)    GFR calc non Af Amer 22 (*)    GFR calc Af Amer 26  (*)    All other components within normal limits  CBC - Abnormal; Notable for the following components:   MCV 103.6 (*)    MCH 34.4 (*)    All other components within normal limits  TROPONIN I (HIGH SENSITIVITY) - Abnormal; Notable for the following components:   Troponin I (High Sensitivity) 28 (*)    All other components within normal limits  TROPONIN I (HIGH SENSITIVITY) - Abnormal; Notable for the following components:   Troponin I (High Sensitivity) 25 (*)    All other components within normal limits    EKG EKG Interpretation  Date/Time:  Friday April 17 2020 22:21:39 EDT Ventricular Rate:  85 PR Interval:  208 QRS Duration: 198 QT Interval:  430 QTC Calculation: 511 R Axis:   -95 Text Interpretation: Normal sinus rhythm Right bundle branch block Septal infarct , age undetermined No significant change since last tracing Abnormal ECG Confirmed by Carmin Muskrat [phone]) on 04/18/2020 9:33:21 AM   Radiology DG Chest 2 View  Result Date: 04/17/2020 CLINICAL DATA:  Chest pain, syncope EXAM: CHEST - 2 VIEW COMPARISON:  Radiograph 09/06/2018 FINDINGS: Streaky opacity present in the right lung base/cardiophrenic sulcus could reflect atelectasis or early airspace disease. Features of edema, pneumothorax, or effusion. Pulmonary vascularity is normally distributed. The aorta is calcified. The remaining cardiomediastinal contours are unremarkable. No acute osseous or soft tissue abnormality. IMPRESSION: Streaky opacity in the right lung base/cardiophrenic sulcus could reflect atelectasis or early airspace disease. No other acute cardiopulmonary abnormality. Aortic Atherosclerosis (ICD10-I70.0). Electronically Signed   By: Lovena Le M.D.   On: 04/17/2020 22:33    Procedures Procedures (including critical care time)  Medications Ordered in ED Medications - No data to display  ED Course  I have reviewed the triage vital signs and the nursing notes.  Pertinent labs & imaging  results that were available during my care of the patient were reviewed by me and considered in my medical decision making (see chart for  details).     X-ray reviewed, concerning for right lung base opacification. Given the patient's acknowledgment of alcohol use, episode of syncope, some suspicion for pneumonia.  Patient will start antibiotics per EKG unchanged, and though the patient has elevated troponins, they have decreased since arrival, and with his history of chronic kidney disease, there are some suspicion for this being related to his renal dysfunction. With no ongoing complaints, no evidence for ischemia, no sustained arrhythmia, the patient is appropriate for initiation of antibiotics, following up with his physician in the clinic. MDM Rules/Calculators/A&P MDM Number of Diagnoses or Management Options Community acquired pneumonia of right lower lobe of lung: new, needed workup Syncope and collapse: new, needed workup   Amount and/or Complexity of Data Reviewed Clinical lab tests: reviewed Tests in the radiology section of CPT: reviewed Tests in the medicine section of CPT: reviewed Decide to obtain previous medical records or to obtain history from someone other than the patient: yes Review and summarize past medical records: yes Independent visualization of images, tracings, or specimens: yes  Risk of Complications, Morbidity, and/or Mortality Presenting problems: high Diagnostic procedures: high Management options: high  Critical Care Total time providing critical care: < 30 minutes  Patient Progress Patient progress: stable  Final Clinical Impression(s) / ED Diagnoses Final diagnoses:  Syncope and collapse  Community acquired pneumonia of right lower lobe of lung    Rx / DC Orders ED Discharge Orders         Ordered    azithromycin (ZITHROMAX) 250 MG tablet  Daily        04/18/20 0938    amoxicillin-clavulanate (AUGMENTIN) 875-125 MG tablet  Every 12 hours         04/18/20 0938           Carmin Muskrat, MD 04/18/20 [phone]

## 2020-04-18 NOTE — Discharge Instructions (Signed)
As discussed, today's evaluation has been generally reassuring. However, on x-ray there are some findings that are concerning for early pneumonia.  It is important to take all medication as directed and follow-up with your physician.  Return here for concerning changes in your condition.

## 2020-07-04 ENCOUNTER — Other Ambulatory Visit: Payer: Self-pay | Admitting: Family Medicine

## 2020-07-04 DIAGNOSIS — M10472 Other secondary gout, left ankle and foot: Secondary | ICD-10-CM

## 2020-07-06 ENCOUNTER — Other Ambulatory Visit: Payer: Self-pay | Admitting: Family Medicine

## 2020-07-06 DIAGNOSIS — M25511 Pain in right shoulder: Secondary | ICD-10-CM

## 2020-07-06 NOTE — Telephone Encounter (Signed)
Requested medication (s) are due for refill today:  Yes  Requested medication (s) are on the active medication list:  Yes  Future visit scheduled:  No  Last Refill: 01/13/20; #360/ RF x 1  Notes to clinic: pt. Was due for f/u in July 2021.  Attempted to call pt.; rec'd message that call could not be completed at this time.  Please advise.   Requested Prescriptions  Pending Prescriptions Disp Refills   gabapentin (NEURONTIN) 300 MG capsule [Pharmacy Med Name: gabapentin 300 mg capsule] 120 capsule 1    Sig: Take 2 capsules (600 mg total) by mouth 2 (two) times daily.      Neurology: Anticonvulsants - gabapentin Failed - 07/06/2020  2:14 PM      Failed - Valid encounter within last 12 months    Recent Outpatient Visits           1 year ago Other secondary acute gout of left ankle   [place], [place], MD   1 year ago Chronic combined systolic and diastolic congestive heart failure [facility])   [place], [place], [place]   2 years ago Chronic combined systolic and diastolic congestive heart failure [facility])   [place], Charlane Ferretti, MD   3 years ago Acute on chronic systolic heart failure [facility])   [place], Charlane Ferretti, MD   3 years ago CKD (chronic kidney disease) stage 3, GFR 30-59 ml/min   [place] [facility] Charlott Rakes, MD

## 2021-04-16 ENCOUNTER — Encounter (HOSPITAL_COMMUNITY): Payer: Self-pay | Admitting: *Deleted

## 2021-04-16 ENCOUNTER — Emergency Department (HOSPITAL_COMMUNITY)
Admission: EM | Admit: 2021-04-16 | Discharge: 2021-04-17 | Disposition: A | Discharge status: Home / Self Care | Payer: Medicare Other | Attending: Physician Assistant | Admitting: Physician Assistant

## 2021-04-16 ENCOUNTER — Emergency Department (HOSPITAL_COMMUNITY): Payer: Medicare Other

## 2021-04-16 ENCOUNTER — Other Ambulatory Visit: Payer: Self-pay

## 2021-04-16 DIAGNOSIS — R079 Chest pain, unspecified: Secondary | ICD-10-CM | POA: Diagnosis not present

## 2021-04-16 DIAGNOSIS — Z79899 Other long term (current) drug therapy: Secondary | ICD-10-CM | POA: Insufficient documentation

## 2021-04-16 DIAGNOSIS — I5042 Chronic combined systolic (congestive) and diastolic (congestive) heart failure: Secondary | ICD-10-CM | POA: Insufficient documentation

## 2021-04-16 DIAGNOSIS — I130 Hypertensive heart and chronic kidney disease with heart failure and stage 1 through stage 4 chronic kidney disease, or unspecified chronic kidney disease: Secondary | ICD-10-CM | POA: Diagnosis not present

## 2021-04-16 DIAGNOSIS — Z87891 Personal history of nicotine dependence: Secondary | ICD-10-CM | POA: Diagnosis not present

## 2021-04-16 DIAGNOSIS — N184 Chronic kidney disease, stage 4 (severe): Secondary | ICD-10-CM | POA: Diagnosis not present

## 2021-04-16 DIAGNOSIS — R6884 Jaw pain: Secondary | ICD-10-CM | POA: Diagnosis not present

## 2021-04-16 DIAGNOSIS — I10 Essential (primary) hypertension: Secondary | ICD-10-CM
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
## 2021-04-17 LAB — BASIC METABOLIC PANEL
Anion gap: 9 (ref 5–15)
BUN: 18 mg/dL (ref 8–23)
CO2: 24 mmol/L (ref 22–32)
Calcium: 9.6 mg/dL (ref 8.9–10.3)
Chloride: 109 mmol/L (ref 98–111)
Creatinine, Ser: 1.97 mg/dL — ABNORMAL HIGH (ref 0.61–1.24)
GFR, Estimated: 37 mL/min — ABNORMAL LOW (ref >60)
Glucose, Bld: 84 mg/dL (ref 70–99)
Potassium: 4.1 mmol/L (ref 3.5–5.1)
Sodium: 142 mmol/L (ref 135–145)

## 2021-04-17 LAB — CBC
HCT: 41.2 % (ref 39.0–52.0)
Hemoglobin: 13.7 g/dL (ref 13.0–17.0)
MCH: 34.7 pg — ABNORMAL HIGH (ref 26.0–34.0)
MCHC: 33.3 g/dL (ref 30.0–36.0)
MCV: 104.3 fL — ABNORMAL HIGH (ref 80.0–100.0)
Platelets: 186 10*3/uL (ref 150–400)
RBC: 3.95 MIL/uL — ABNORMAL LOW (ref 4.22–5.81)
RDW: 12 % (ref 11.5–15.5)
WBC: 5.4 10*3/uL (ref 4.0–10.5)
nRBC: 0 % (ref 0.0–0.2)

## 2021-04-17 LAB — TROPONIN I (HIGH SENSITIVITY)
Troponin I (High Sensitivity): 12 ng/L (ref <18)
Troponin I (High Sensitivity): 9 ng/L (ref <18)

## 2021-04-17 NOTE — Discharge Instructions (Addendum)
You were evaluated in the Emergency Department and after careful evaluation, we did not find any emergent condition requiring admission or further testing in the hospital.  Work-up today was overall reassuring.  Please make sure to follow-up with cardiology regarding her chest pain.  You may use Tylenol/ibuprofen/ice for your pain.  Please return to the Emergency Department if you experience any worsening of your condition.   Thank you for allowing [place] to be a part of your care.
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
## 2021-08-25 ENCOUNTER — Encounter: Payer: Self-pay | Admitting: Cardiovascular Disease

## 2022-06-17 DEATH — deceased

## 2022-08-31 ENCOUNTER — Emergency Department (HOSPITAL_COMMUNITY)
Admission: EM | Admit: 2022-08-31 | Discharge: 2022-08-31 | Disposition: A | Discharge status: Home / Self Care | Payer: 59 | Attending: Emergency Medicine | Admitting: Emergency Medicine

## 2022-08-31 ENCOUNTER — Emergency Department (HOSPITAL_COMMUNITY): Payer: 59

## 2022-08-31 ENCOUNTER — Other Ambulatory Visit: Payer: Self-pay

## 2022-08-31 DIAGNOSIS — Z7982 Long term (current) use of aspirin: Secondary | ICD-10-CM | POA: Diagnosis not present

## 2022-08-31 DIAGNOSIS — R000 Tachycardia, unspecified: Secondary | ICD-10-CM | POA: Insufficient documentation

## 2022-08-31 DIAGNOSIS — I509 Heart failure, unspecified: Secondary | ICD-10-CM | POA: Diagnosis not present

## 2022-08-31 DIAGNOSIS — N184 Chronic kidney disease, stage 4 (severe): Secondary | ICD-10-CM | POA: Insufficient documentation

## 2022-08-31 DIAGNOSIS — J209 Acute bronchitis, unspecified: Secondary | ICD-10-CM | POA: Insufficient documentation

## 2022-08-31 DIAGNOSIS — Z5900 Homelessness unspecified: Secondary | ICD-10-CM | POA: Diagnosis not present

## 2022-08-31 DIAGNOSIS — Z1152 Encounter for screening for COVID-19: Secondary | ICD-10-CM | POA: Insufficient documentation

## 2022-08-31 DIAGNOSIS — I130 Hypertensive heart and chronic kidney disease with heart failure and stage 1 through stage 4 chronic kidney disease, or unspecified chronic kidney disease: Secondary | ICD-10-CM | POA: Diagnosis not present

## 2022-08-31 DIAGNOSIS — F17290 Nicotine dependence, other tobacco product, uncomplicated: Secondary | ICD-10-CM | POA: Insufficient documentation

## 2022-08-31 DIAGNOSIS — R7989 Other specified abnormal findings of blood chemistry: Secondary | ICD-10-CM | POA: Diagnosis not present

## 2022-08-31 DIAGNOSIS — I2510 Atherosclerotic heart disease of native coronary artery without angina pectoris: Secondary | ICD-10-CM | POA: Diagnosis not present

## 2022-08-31 DIAGNOSIS — R059 Cough, unspecified: Secondary | ICD-10-CM | POA: Diagnosis present

## 2022-08-31 LAB — COMPREHENSIVE METABOLIC PANEL
ALT: 38 U/L (ref 0–44)
AST: 38 U/L (ref 15–41)
Albumin: 3.8 g/dL (ref 3.5–5.0)
Alkaline Phosphatase: 73 U/L (ref 38–126)
Anion gap: 12 (ref 5–15)
BUN: 17 mg/dL (ref 8–23)
CO2: 22 mmol/L (ref 22–32)
Calcium: 9.6 mg/dL (ref 8.9–10.3)
Chloride: 104 mmol/L (ref 98–111)
Creatinine, Ser: 1.79 mg/dL — ABNORMAL HIGH (ref 0.61–1.24)
GFR, Estimated: 41 mL/min — ABNORMAL LOW (ref >60)
Glucose, Bld: 124 mg/dL — ABNORMAL HIGH (ref 70–99)
Potassium: 4.6 mmol/L (ref 3.5–5.1)
Sodium: 138 mmol/L (ref 135–145)
Total Bilirubin: 1.1 mg/dL (ref 0.3–1.2)
Total Protein: 6.8 g/dL (ref 6.5–8.1)

## 2022-08-31 LAB — TROPONIN I (HIGH SENSITIVITY)
Troponin I (High Sensitivity): 35 ng/L — ABNORMAL HIGH (ref <18)
Troponin I (High Sensitivity): 34 ng/L — ABNORMAL HIGH (ref <18)

## 2022-08-31 LAB — CBC WITH DIFFERENTIAL/PLATELET
Abs Immature Granulocytes: 0.02 10*3/uL (ref 0.00–0.07)
Basophils Absolute: 0.1 10*3/uL (ref 0.0–0.1)
Basophils Relative: 1 %
Eosinophils Absolute: 0.2 10*3/uL (ref 0.0–0.5)
Eosinophils Relative: 3 %
HCT: 46.9 % (ref 39.0–52.0)
Hemoglobin: 15.2 g/dL (ref 13.0–17.0)
Immature Granulocytes: 0 %
Lymphocytes Relative: 25 %
Lymphs Abs: 1.7 10*3/uL (ref 0.7–4.0)
MCH: 34.2 pg — ABNORMAL HIGH (ref 26.0–34.0)
MCHC: 32.4 g/dL (ref 30.0–36.0)
MCV: 105.4 fL — ABNORMAL HIGH (ref 80.0–100.0)
Monocytes Absolute: 0.7 10*3/uL (ref 0.1–1.0)
Monocytes Relative: 10 %
Neutro Abs: 4.2 10*3/uL (ref 1.7–7.7)
Neutrophils Relative %: 61 %
Platelets: 189 10*3/uL (ref 150–400)
RBC: 4.45 MIL/uL (ref 4.22–5.81)
RDW: 12.3 % (ref 11.5–15.5)
WBC: 6.9 10*3/uL (ref 4.0–10.5)
nRBC: 0 % (ref 0.0–0.2)

## 2022-08-31 LAB — BRAIN NATRIURETIC PEPTIDE: B Natriuretic Peptide: 1495.8 pg/mL — ABNORMAL HIGH (ref 0.0–100.0)

## 2022-08-31 LAB — RESP PANEL BY RT-PCR (RSV, FLU A&B, COVID)  RVPGX2
Influenza A by PCR: NEGATIVE
Influenza B by PCR: NEGATIVE
Resp Syncytial Virus by PCR: NEGATIVE
SARS Coronavirus 2 by RT PCR: NEGATIVE

## 2022-08-31 LAB — D-DIMER, QUANTITATIVE: D-Dimer, Quant: 0.45 ug/mL-FEU (ref 0.00–0.50)

## 2022-08-31 MED ORDER — SODIUM CHLORIDE 0.9 % IV BOLUS
500.0000 mL | Freq: Once | INTRAVENOUS | Status: AC
  Administered 2022-08-31: 500 mL via INTRAVENOUS

## 2022-08-31 MED ORDER — DOXYCYCLINE HYCLATE 100 MG PO CAPS: 100.0000 mg | ORAL_CAPSULE | Freq: Two times a day (BID) | ORAL | 0 refills | Status: DC

## 2022-08-31 MED ORDER — ALBUTEROL SULFATE HFA 108 (90 BASE) MCG/ACT IN AERS
2.0000 | INHALATION_SPRAY | Freq: Once | RESPIRATORY_TRACT | Status: AC
  Administered 2022-08-31: 2 via RESPIRATORY_TRACT
  Filled 2022-08-31: qty 6.7

## 2022-08-31 MED ORDER — DEXAMETHASONE SODIUM PHOSPHATE 10 MG/ML IJ SOLN
10.0000 mg | Freq: Once | INTRAMUSCULAR | Status: AC
  Administered 2022-08-31: 10 mg via INTRAVENOUS
  Filled 2022-08-31: qty 1

## 2022-08-31 MED ORDER — ALBUTEROL SULFATE HFA 108 (90 BASE) MCG/ACT IN AERS
2.0000 | INHALATION_SPRAY | Freq: Once | RESPIRATORY_TRACT | Status: AC
  Administered 2022-08-31: 2 via RESPIRATORY_TRACT

## 2022-08-31 MED ORDER — ALBUTEROL SULFATE HFA 108 (90 BASE) MCG/ACT IN AERS: 2.0000 | INHALATION_SPRAY | RESPIRATORY_TRACT | 0 refills | Status: DC | PRN

## 2022-08-31 MED ORDER — ACETAMINOPHEN 325 MG PO TABS
650.0000 mg | ORAL_TABLET | Freq: Once | ORAL | Status: AC
  Administered 2022-08-31: 650 mg via ORAL
  Filled 2022-08-31: qty 2

## 2022-08-31 NOTE — ED Notes (Addendum)
Patient ambulated around nursing station with Pulse Ox HR 113, he maintained PsO2 97% on room air

## 2022-08-31 NOTE — Discharge Instructions (Signed)
You were seen today for respiratory complaints.  Your workup is largely reassuring.  You likely have some acute bronchitis.  The cough from bronchitis can linger for 4 to 6 weeks.  You should avoid smoking.  Your chest x-ray did not show an obvious pneumonia but given the productivity of your sputum and chills at home, you will be started on doxycycline.  If you have any new or worsening symptoms, you should be reevaluated.

## 2022-08-31 NOTE — ED Triage Notes (Signed)
Patient C/O generalized weakness, flu like SX, body aches, SHOB, productive cough

## 2022-08-31 NOTE — ED Provider Notes (Signed)
[name] Provider Note   CSN: [id number] Arrival date & time:        History  Chief Complaint  Patient presents with   Weakness    [name] is a 67 y.o. male.  HPI     This is a 67 year old male who presents with several day history of shortness of breath, cough, body aches.  Patient reports cough productive of green sputum.  He reports congestion and shortness of breath.  He reports intermittent chest discomfort.  He also has had some nausea, vomiting, diarrhea.  No sick contacts.  Has had chills without fevers.  He is a non-smoker but does occasionally smoke crack.  Last smoked 1 week ago.  Home Medications Prior to Admission medications   Medication Sig Start Date End Date Taking? Authorizing Provider  albuterol (VENTOLIN HFA) 108 (90 Base) MCG/ACT inhaler Inhale 2 puffs into the lungs every 4 (four) hours as needed for wheezing or shortness of breath. 08/31/22  Yes Edword Cu, Barbette Hair, MD  doxycycline (VIBRAMYCIN) 100 MG capsule Take 1 capsule (100 mg total) by mouth 2 (two) times daily. 08/31/22  Yes Allycia Pitz, Barbette Hair, MD  allopurinol (ZYLOPRIM) 300 MG tablet Take 0.5 tablets (150 mg total) by mouth daily. 05/08/19   Charlott Rakes, MD  aspirin 81 MG EC tablet Take 1 tablet (81 mg total) by mouth daily. 01/06/17   Charlott Rakes, MD  b complex vitamins tablet Take 1 tablet by mouth daily.    [provider]  carvedilol (COREG) 3.125 MG tablet Take 1 tablet (3.125 mg total) by mouth 2 (two) times daily. Please call to make appointment. 07/15/19 08/14/19  Charlott Rakes, MD  clotrimazole-betamethasone (LOTRISONE) cream Apply 1 application topically 2 (two) times daily. 02/04/19   Charlott Rakes, MD  Doxylamine-DM (VICKS DAYQUIL/NYQUIL COUGH PO) Take 2 tablets by mouth 2 (two) times daily as needed (cough/congestion).    [provider]  furosemide (LASIX) 40 MG tablet Take 1 tablet (40 mg total) by mouth 2  (two) times daily for 30 days. Patient not taking: Reported on 07/06/2019 11/01/18 12/01/18  Charlott Rakes, MD  furosemide (LASIX) 80 MG tablet Take 40 mg by mouth 2 (two) times daily. 06/06/19   [provider]  gabapentin (NEURONTIN) 300 MG capsule Take 2 capsules (600 mg total) by mouth 2 (two) times daily. 01/13/20   Charlott Rakes, MD  guaiFENesin (MUCINEX PO) Take 1 Dose by mouth 2 (two) times daily as needed (cough/congestion).    [provider]  guaifenesin (ROBITUSSIN) 100 MG/5ML syrup Take 200 mg by mouth at bedtime as needed for cough.    [provider]  Misc. Devices MISC Blood pressure monitor Dx: Hypertension, CHF 11/01/18   Charlott Rakes, MD  nitroGLYCERIN (NITROSTAT) 0.4 MG SL tablet Place 1 tablet (0.4 mg total) under the tongue every 5 (five) minutes x 3 doses as needed for chest pain. 01/06/17   Charlott Rakes, MD  pantoprazole (PROTONIX) 40 MG tablet Take 40 mg by mouth daily. 05/14/19   [provider]  Phenyleph-CPM-DM-APAP (ALKA-SELTZER PLUS COLD & FLU PO) Take 1 Dose by mouth at bedtime as needed (cough/congestion).    [provider]  potassium chloride SA (K-DUR,KLOR-CON) 20 MEQ tablet Take 20 mEq by mouth daily. 06/04/16   [provider]  sacubitril-valsartan (ENTRESTO) 24-26 MG Take 1 tablet by mouth 2 (two) times daily. Patient not taking: Reported on 07/06/2019 11/01/18   Charlott Rakes, MD  sacubitril-valsartan [facility]) [phone]  MG Take 2 tablets by mouth 2 (two) times daily.    [provider]  tiZANidine (ZANAFLEX) 4 MG tablet Take 1 tablet (4 mg total) by mouth every 6 (six) hours as needed for muscle spasms. MUST MAKE APPT FOR FURTHER REFILLS 02/22/19   Charlott Rakes, MD      Allergies    Tape    Review of Systems   Review of Systems  Constitutional:  Positive for chills. Negative for fever.  HENT:  Positive for congestion.   Respiratory:  Positive for cough and shortness of breath.    Cardiovascular:  Positive for chest pain.  Gastrointestinal:  Negative for diarrhea, nausea and vomiting.  All other systems reviewed and are negative.   Physical Exam Updated Vital Signs BP (!) 174/105   Pulse 85   Temp 97.7 F (36.5 C) (Oral)   Resp 19   Ht 1.727 m ('5\' 8"'$ )   Wt 89.8 kg   SpO2 94%   BMI 30.11 kg/m  Physical Exam Vitals and nursing note reviewed.  Constitutional:      Appearance: He is well-developed. He is not ill-appearing.  HENT:     Head: Normocephalic and atraumatic.  Eyes:     Pupils: Pupils are equal, round, and reactive to light.  Cardiovascular:     Rate and Rhythm: Regular rhythm. Tachycardia present.     Heart sounds: Normal heart sounds. No murmur heard. Pulmonary:     Effort: Pulmonary effort is normal. No respiratory distress.     Breath sounds: Normal breath sounds. No wheezing.     Comments: Cough with wheezing noted Abdominal:     Palpations: Abdomen is soft.     Tenderness: There is no abdominal tenderness. There is no rebound.  Musculoskeletal:     Cervical back: Neck supple.  Lymphadenopathy:     Cervical: No cervical adenopathy.  Skin:    General: Skin is warm and dry.  Neurological:     Mental Status: He is alert and oriented to person, place, and time.  Psychiatric:        Mood and Affect: Mood normal.     ED Results / Procedures / Treatments   Labs (all labs ordered are listed, but only abnormal results are displayed) Labs Reviewed  CBC WITH DIFFERENTIAL/PLATELET - Abnormal; Notable for the following components:      Result Value   MCV 105.4 (*)    MCH 34.2 (*)    All other components within normal limits  COMPREHENSIVE METABOLIC PANEL - Abnormal; Notable for the following components:   Glucose, Bld 124 (*)    Creatinine, Ser 1.79 (*)    GFR, Estimated 41 (*)    All other components within normal limits  BRAIN NATRIURETIC PEPTIDE - Abnormal; Notable for the following components:   B Natriuretic Peptide 1,495.8  (*)    All other components within normal limits  TROPONIN I (HIGH SENSITIVITY) - Abnormal; Notable for the following components:   Troponin I (High Sensitivity) 35 (*)    All other components within normal limits  TROPONIN I (HIGH SENSITIVITY) - Abnormal; Notable for the following components:   Troponin I (High Sensitivity) 34 (*)    All other components within normal limits  RESP PANEL BY RT-PCR (RSV, FLU A&B, COVID)  RVPGX2  D-DIMER, QUANTITATIVE    EKG EKG Interpretation  Date/Time:  Wednesday August 31 2022 01:46:35 EST Ventricular Rate:  111 PR Interval:  152 QRS Duration: 188 QT Interval:  422 QTC Calculation: 574  R Axis:   219 Text Interpretation: Sinus tachycardia Right bundle branch block No significant change since last tracing Confirmed by Thayer Jew [phone]) on 08/31/2022 1:49:18 AM  Radiology DG Chest Portable 1 View  Result Date: 08/31/2022 CLINICAL DATA:  Shortness of breath and cough. EXAM: PORTABLE CHEST 1 VIEW COMPARISON:  04/17/2020. FINDINGS: The heart is enlarged and mediastinal contours are within normal limits. There is atherosclerotic calcification of the aorta. Mild airspace disease is present in the medial aspect of the right lung base and retrocardiac region on the left. No effusion or pneumothorax. No acute osseous abnormality. IMPRESSION: Mild atelectasis or infiltrate at the lung bases. Electronically Signed   By: Brett Fairy M.D.   On: 08/31/2022 02:05    Procedures Procedures    Medications Ordered in ED Medications  albuterol (VENTOLIN HFA) 108 (90 Base) MCG/ACT inhaler 2 puff (2 puffs Inhalation Given 08/31/22 0215)  sodium chloride 0.9 % bolus 500 mL (0 mLs Intravenous Stopped 08/31/22 0426)  acetaminophen (TYLENOL) tablet 650 mg (650 mg Oral Given 08/31/22 0333)  dexamethasone (DECADRON) injection 10 mg (10 mg Intravenous Given 08/31/22 0523)  albuterol (VENTOLIN HFA) 108 (90 Base) MCG/ACT inhaler 2 puff (2 puffs Inhalation Given 08/31/22  0523)    ED Course/ Medical Decision Making/ A&P Clinical Course as of 08/31/22 0603  Wed Aug 31, 2022  0515 Improvement noted within the alar.  He does have still some occasional wheezing.  Given a dose of Decadron.  Suspect acute bronchitis. [CH]    Clinical Course User Index [CH] Delman Goshorn, Barbette Hair, MD                             Medical Decision Making Amount and/or Complexity of Data Reviewed Labs: ordered. Radiology: ordered.  Risk OTC drugs. Prescription drug management.   This patient presents to the ED for concern of shortness of breath, cough, congestion, this involves an extensive number of treatment options, and is a complaint that carries with it a high risk of complications and morbidity.  I considered the following differential and admission for this acute, potentially life threatening condition.  The differential diagnosis includes viral illness such as COVID or influenza, bronchitis, pneumonia, CHF  MDM:    This is a 67 year old male with a history of hypertension, coronary artery disease, polysubstance abuse, CHF who presents with congestion, cough, shortness of breath.  He is nontoxic-appearing.  Initially tachycardic.  EKG shows no evidence of acute ischemia or arrhythmia.  He is also slightly hypertensive.  Temperature 97.7.  Workup initiated.  Chest x-ray shows mild atelectasis or infiltrates at the lung bases.  COVID and influenza negative.  Initial troponin 35.  This is in the setting of chronic renal insufficiency with creatinine of 1.79.  Will repeat.  No active chest pain at this time and EKG is reassuring.  Doubt primary ACS.  Repeat troponin is 34.  D-dimer is negative.  BNP is slightly elevated at 1495.  Patient does not appear volume overloaded.  On recheck he had some improvement with albuterol.  He was given a dose of Decadron.  Suspect acute bronchitis and possibly early pneumonia.  Will discharge with an albuterol inhaler and doxycycline.  He was able to  ambulate maintain his pulse ox.  He was mildly tachycardic but this is in the setting of recent albuterol use.  (Labs, imaging, consults)  Labs: I Ordered, and personally interpreted labs.  The pertinent results include: CBC BMP,  BNP, troponin x 2, D-dimer, COVID and influenza testing  Imaging Studies ordered: I ordered imaging studies including chest x-ray I independently visualized and interpreted imaging. I agree with the radiologist interpretation  Additional history obtained from chart review.  External records from outside source obtained and reviewed including prior evaluations  Cardiac Monitoring: The patient was maintained on a cardiac monitor.  If on the cardiac monitor, I personally viewed and interpreted the cardiac monitored which showed an underlying rhythm of: Sinus rhythm  Reevaluation: After the interventions noted above, I reevaluated the patient and found that they have :improved  Social Determinants of Health:  lives independently, history of substance abuse  Disposition: Discharge  Co morbidities that complicate the patient evaluation  Past Medical History:  Diagnosis Date   Anxiety    Arthritis    "all over" (01/07/2015)   CKD (chronic kidney disease), stage IV ([place])    Depression    Headache    "probably weekly" (01/07/2015)   History of echocardiogram 12/2014   EF 20-25% -> improved up to EF 35 and 40% February 2020:   Homelessness    Hypercholesterolemia    Hypertension    Ischemic dilated cardiomyopathy ([place])    Noncompliance    Nonischemic dilated cardiomyopathy ([place]) 07/2014   Initial EF was 2025% with global hypokinesis.  As of February 2020 EF up to 35-40%, GRII DD.  Global hypokinesis.  No WMA.  Relatively normal valves.   NSTEMI (non-ST elevated myocardial infarction) [facility]) 2016   2 separate episodes in January and June.  Normal coronary arteries by cardiac catheterization.  Thought to be related to.   Polysubstance abuse (HCC)    etoh,  cocaine   Urinary hesitancy    Walking pneumonia 07/2014     Medicines Meds ordered this encounter  Medications   albuterol (VENTOLIN HFA) 108 (90 Base) MCG/ACT inhaler 2 puff   sodium chloride 0.9 % bolus 500 mL   acetaminophen (TYLENOL) tablet 650 mg   dexamethasone (DECADRON) injection 10 mg   albuterol (VENTOLIN HFA) 108 (90 Base) MCG/ACT inhaler 2 puff   albuterol (VENTOLIN HFA) 108 (90 Base) MCG/ACT inhaler    Sig: Inhale 2 puffs into the lungs every 4 (four) hours as needed for wheezing or shortness of breath.    Dispense:  1 each    Refill:  0   doxycycline (VIBRAMYCIN) 100 MG capsule    Sig: Take 1 capsule (100 mg total) by mouth 2 (two) times daily.    Dispense:  20 capsule    Refill:  0    I have reviewed the patients home medicines and have made adjustments as needed  Problem List / ED Course: Problem List Items Addressed This Visit   None Visit Diagnoses     Acute bronchitis, unspecified organism    -  Primary                   Final Clinical Impression(s) / ED Diagnoses Final diagnoses:  Acute bronchitis, unspecified organism    Rx / DC Orders ED Discharge Orders          Ordered    albuterol (VENTOLIN HFA) 108 (90 Base) MCG/ACT inhaler  Every 4 hours PRN        08/31/22 0558    doxycycline (VIBRAMYCIN) 100 MG capsule  2 times daily        08/31/22 0558              Aamari Strawderman, Barbette Hair, MD 08/31/22  0608  

## 2022-10-06 ENCOUNTER — Other Ambulatory Visit: Payer: Self-pay

## 2022-10-06 ENCOUNTER — Encounter (HOSPITAL_COMMUNITY): Payer: Self-pay

## 2022-10-06 ENCOUNTER — Emergency Department (HOSPITAL_COMMUNITY): Payer: 59

## 2022-10-06 ENCOUNTER — Emergency Department (HOSPITAL_COMMUNITY)
Admission: EM | Admit: 2022-10-06 | Discharge: 2022-10-06 | Disposition: A | Discharge status: Home / Self Care | Payer: 59 | Attending: Emergency Medicine | Admitting: Emergency Medicine

## 2022-10-06 DIAGNOSIS — R7989 Other specified abnormal findings of blood chemistry: Secondary | ICD-10-CM | POA: Diagnosis not present

## 2022-10-06 DIAGNOSIS — R0602 Shortness of breath: Secondary | ICD-10-CM | POA: Diagnosis not present

## 2022-10-06 DIAGNOSIS — R1084 Generalized abdominal pain: Secondary | ICD-10-CM

## 2022-10-06 DIAGNOSIS — Z1152 Encounter for screening for COVID-19: Secondary | ICD-10-CM | POA: Insufficient documentation

## 2022-10-06 DIAGNOSIS — R0789 Other chest pain: Secondary | ICD-10-CM | POA: Diagnosis present

## 2022-10-06 DIAGNOSIS — I129 Hypertensive chronic kidney disease with stage 1 through stage 4 chronic kidney disease, or unspecified chronic kidney disease: Secondary | ICD-10-CM | POA: Insufficient documentation

## 2022-10-06 DIAGNOSIS — N184 Chronic kidney disease, stage 4 (severe): Secondary | ICD-10-CM | POA: Diagnosis not present

## 2022-10-06 DIAGNOSIS — I2510 Atherosclerotic heart disease of native coronary artery without angina pectoris: Secondary | ICD-10-CM | POA: Diagnosis not present

## 2022-10-06 DIAGNOSIS — Z5900 Homelessness unspecified: Secondary | ICD-10-CM | POA: Diagnosis not present

## 2022-10-06 DIAGNOSIS — R072 Precordial pain: Secondary | ICD-10-CM | POA: Diagnosis not present

## 2022-10-06 LAB — CBC WITH DIFFERENTIAL/PLATELET
Abs Immature Granulocytes: 0.02 10*3/uL (ref 0.00–0.07)
Basophils Absolute: 0.1 10*3/uL (ref 0.0–0.1)
Basophils Relative: 1 %
Eosinophils Absolute: 0.1 10*3/uL (ref 0.0–0.5)
Eosinophils Relative: 1 %
HCT: 44.7 % (ref 39.0–52.0)
Hemoglobin: 14.8 g/dL (ref 13.0–17.0)
Immature Granulocytes: 0 %
Lymphocytes Relative: 31 %
Lymphs Abs: 1.5 10*3/uL (ref 0.7–4.0)
MCH: 34.5 pg — ABNORMAL HIGH (ref 26.0–34.0)
MCHC: 33.1 g/dL (ref 30.0–36.0)
MCV: 104.2 fL — ABNORMAL HIGH (ref 80.0–100.0)
Monocytes Absolute: 0.5 10*3/uL (ref 0.1–1.0)
Monocytes Relative: 11 %
Neutro Abs: 2.7 10*3/uL (ref 1.7–7.7)
Neutrophils Relative %: 56 %
Platelets: 196 10*3/uL (ref 150–400)
RBC: 4.29 MIL/uL (ref 4.22–5.81)
RDW: 13.2 % (ref 11.5–15.5)
WBC: 4.9 10*3/uL (ref 4.0–10.5)
nRBC: 0 % (ref 0.0–0.2)

## 2022-10-06 LAB — COMPREHENSIVE METABOLIC PANEL
ALT: 48 U/L — ABNORMAL HIGH (ref 0–44)
AST: 35 U/L (ref 15–41)
Albumin: 4.2 g/dL (ref 3.5–5.0)
Alkaline Phosphatase: 82 U/L (ref 38–126)
Anion gap: 11 (ref 5–15)
BUN: 26 mg/dL — ABNORMAL HIGH (ref 8–23)
CO2: 21 mmol/L — ABNORMAL LOW (ref 22–32)
Calcium: 9.9 mg/dL (ref 8.9–10.3)
Chloride: 109 mmol/L (ref 98–111)
Creatinine, Ser: 2.3 mg/dL — ABNORMAL HIGH (ref 0.61–1.24)
GFR, Estimated: 30 mL/min — ABNORMAL LOW (ref >60)
Glucose, Bld: 100 mg/dL — ABNORMAL HIGH (ref 70–99)
Potassium: 4.3 mmol/L (ref 3.5–5.1)
Sodium: 141 mmol/L (ref 135–145)
Total Bilirubin: 1.9 mg/dL — ABNORMAL HIGH (ref 0.3–1.2)
Total Protein: 7.2 g/dL (ref 6.5–8.1)

## 2022-10-06 LAB — RESP PANEL BY RT-PCR (RSV, FLU A&B, COVID)  RVPGX2
Influenza A by PCR: NEGATIVE
Influenza B by PCR: NEGATIVE
Resp Syncytial Virus by PCR: NEGATIVE
SARS Coronavirus 2 by RT PCR: NEGATIVE

## 2022-10-06 LAB — BRAIN NATRIURETIC PEPTIDE: B Natriuretic Peptide: >4500 pg/mL — ABNORMAL HIGH (ref 0.0–100.0)

## 2022-10-06 LAB — TROPONIN I (HIGH SENSITIVITY)
Troponin I (High Sensitivity): 50 ng/L — ABNORMAL HIGH (ref <18)
Troponin I (High Sensitivity): 47 ng/L — ABNORMAL HIGH (ref <18)
Troponin I (High Sensitivity): 54 ng/L — ABNORMAL HIGH (ref <18)

## 2022-10-06 LAB — LIPASE, BLOOD: Lipase: 23 U/L (ref 11–51)

## 2022-10-06 MED ORDER — ONDANSETRON HCL 4 MG/2ML IJ SOLN
4.0000 mg | Freq: Once | INTRAMUSCULAR | Status: AC
  Administered 2022-10-06: 4 mg via INTRAVENOUS
  Filled 2022-10-06: qty 2

## 2022-10-06 MED ORDER — ASPIRIN 81 MG PO CHEW
324.0000 mg | CHEWABLE_TABLET | Freq: Once | ORAL | Status: AC
  Administered 2022-10-06: 324 mg via ORAL
  Filled 2022-10-06: qty 4

## 2022-10-06 MED ORDER — FUROSEMIDE 10 MG/ML IJ SOLN
60.0000 mg | Freq: Once | INTRAMUSCULAR | Status: AC
  Administered 2022-10-06: 60 mg via INTRAVENOUS
  Filled 2022-10-06: qty 6

## 2022-10-06 MED ORDER — SODIUM CHLORIDE 0.9 % IV BOLUS: 500.0000 mL | Freq: Once | INTRAVENOUS | Status: DC

## 2022-10-06 NOTE — Discharge Instructions (Signed)
You were seen for your chest pain in the emergency department.   At home, please continue your medications including your Lasix.    Follow-up with your primary doctor in 2-3 days regarding your visit.  Cardiology will be calling you regarding an appointment within the next 72 hours.  You may contact them if you do not hear from them in that time using the information in this packet.  Return immediately to the emergency department if you experience any of the following: Worsening pain, difficulty breathing, unexplained vomiting or sweating, or any other concerning symptoms.    Thank you for visiting our Emergency Department. It was a pleasure taking care of you today.

## 2022-10-06 NOTE — ED Provider Notes (Signed)
Emergency Department Provider Note   I have reviewed the triage vital signs and the nursing notes.   HISTORY  Chief Complaint Chest Pain   HPI [name] is a 67 y.o. male past history of CKD, HLD, ischemic cardiomyopathy, and CAD presents emergency department valuation of pressure/pain through his mid abdomen and radiating up into the center of his chest.  Some radiation to the jaw as well with nausea and vomiting.  Symptoms have been intermittent over the past week with more severe chest discomfort this morning along with dizziness starting at around 8 AM.  Pain at this time is improved somewhat.  He is compliant with his medications although did not take them this morning.  He is feeling slightly short of breath as well.  Does not appreciate any fluid retention although does not weigh himself daily.  No fevers or chills.  No ripping/tearing pain in the abdomen or chest.  No pleuritic pain.   Past Medical History:  Diagnosis Date   Anxiety    Arthritis    "all over" (01/07/2015)   CKD (chronic kidney disease), stage IV ([place])    Depression    Headache    "probably weekly" (01/07/2015)   History of echocardiogram 12/2014   EF 20-25% -> improved up to EF 35 and 40% February 2020:   Homelessness    Hypercholesterolemia    Hypertension    Ischemic dilated cardiomyopathy ([place])    Noncompliance    Nonischemic dilated cardiomyopathy ([place]) 07/2014   Initial EF was 2025% with global hypokinesis.  As of February 2020 EF up to 35-40%, GRII DD.  Global hypokinesis.  No WMA.  Relatively normal valves.   NSTEMI (non-ST elevated myocardial infarction) [facility]) 2016   2 separate episodes in January and June.  Normal coronary arteries by cardiac catheterization.  Thought to be related to.   Polysubstance abuse (HCC)    etoh, cocaine   Urinary hesitancy    Walking pneumonia 07/2014    Review of Systems  Constitutional: No fever/chills Eyes: No visual changes. ENT: No sore  throat. Cardiovascular: Positive chest pain. Respiratory: Denies shortness of breath. Gastrointestinal: Positive abdominal pain. Positive nausea, vomiting, and intermittent diarrhea.  No constipation. Genitourinary: Negative for dysuria. Musculoskeletal: Negative for back pain. Skin: Negative for rash. Neurological: Negative for headaches.  ____________________________________________   PHYSICAL EXAM:  VITAL SIGNS: ED Triage Vitals  Enc Vitals Group     BP 10/06/22 1028 (!) 153/86     Pulse Rate 10/06/22 1028 (!) 102     Resp 10/06/22 1028 (!) 28     Temp 10/06/22 1028 (!) 97.4 F (36.3 C)     Temp Source 10/06/22 1028 Oral     SpO2 10/06/22 1028 100 %     Weight 10/06/22 1034 190 lb (86.2 kg)     Height 10/06/22 1034 5\' 8"  (1.727 m)   Constitutional: Alert and oriented. Well appearing and in no acute distress. Eyes: Conjunctivae are normal. Head: Atraumatic. Nose: No congestion/rhinnorhea. Mouth/Throat: Mucous membranes are moist.   Neck: No stridor.   Cardiovascular: Tachycardia. Good peripheral circulation. Grossly normal heart sounds.   Respiratory: Normal respiratory effort.  No retractions. Lungs CTAB. Gastrointestinal: Soft with mild diffuse tenderness throughout. No focal tenderness or peritonitis. No distention.  Musculoskeletal: No lower extremity tenderness with trace pitting edema in the B/L LEs. No gross deformities of extremities. Neurologic:  Normal speech and language. No gross focal neurologic deficits are appreciated.  Skin:  Skin is warm, dry and  intact. No rash noted.  ____________________________________________   LABS (all labs ordered are listed, but only abnormal results are displayed)  Labs Reviewed  COMPREHENSIVE METABOLIC PANEL - Abnormal; Notable for the following components:      Result Value   CO2 21 (*)    Glucose, Bld 100 (*)    BUN 26 (*)    Creatinine, Ser 2.30 (*)    ALT 48 (*)    Total Bilirubin 1.9 (*)    GFR, Estimated 30 (*)     All other components within normal limits  CBC WITH DIFFERENTIAL/PLATELET - Abnormal; Notable for the following components:   MCV 104.2 (*)    MCH 34.5 (*)    All other components within normal limits  BRAIN NATRIURETIC PEPTIDE - Abnormal; Notable for the following components:   B Natriuretic Peptide >4,500.0 (*)    All other components within normal limits  TROPONIN I (HIGH SENSITIVITY) - Abnormal; Notable for the following components:   Troponin I (High Sensitivity) 50 (*)    All other components within normal limits  TROPONIN I (HIGH SENSITIVITY) - Abnormal; Notable for the following components:   Troponin I (High Sensitivity) 47 (*)    All other components within normal limits  RESP PANEL BY RT-PCR (RSV, FLU A&B, COVID)  RVPGX2  LIPASE, BLOOD  URINALYSIS, ROUTINE W REFLEX MICROSCOPIC  TROPONIN I (HIGH SENSITIVITY)   ____________________________________________  EKG   EKG Interpretation  Date/Time:  Thursday October 06 2022 10:30:12 EDT Ventricular Rate:  102 PR Interval:  153 QRS Duration: 198 QT Interval:  498 QTC Calculation: 649 R Axis:   255 Text Interpretation: Sinus tachycardia RBBB and LAFB Similar to prior tracing Confirmed by Nanda Quinton [phone]) on 10/06/2022 10:36:09 AM        ____________________________________________  RADIOLOGY  DG Chest 2 View  Result Date: 10/06/2022 CLINICAL DATA:  Chest pain and abdominal swelling for 2 days. EXAM: CHEST - 2 VIEW COMPARISON:  Chest radiographs 08/31/2022 and 04/17/2020 FINDINGS: Cardiac silhouette is again mildly enlarged. Mediastinal contours are within normal limits. Mild calcification within aortic arch. Unchanged subtle left midlung horizontal linear scarring. No acute airspace opacity. No pleural effusion pneumothorax. Minimal multilevel degenerative disc changes of the thoracic spine. IMPRESSION: No active cardiopulmonary disease. Electronically Signed   By: Yvonne Kendall M.D.   On: 10/06/2022 11:57     ____________________________________________   PROCEDURES  Procedure(s) performed:   Procedures  None  ____________________________________________   INITIAL IMPRESSION / ASSESSMENT AND PLAN / ED COURSE  Pertinent labs & imaging results that were available during my care of the patient were reviewed by me and considered in my medical decision making (see chart for details).   This patient is Presenting for Evaluation of CP, which does require a range of treatment options, and is a complaint that involves a high risk of morbidity and mortality.  The Differential Diagnoses includes but is not exclusive to acute coronary syndrome, aortic dissection, pulmonary embolism, cardiac tamponade, community-acquired pneumonia, pericarditis, musculoskeletal chest wall pain, etc.   Critical Interventions-    Medications  aspirin chewable tablet 324 mg (324 mg Oral Given 10/06/22 1224)  ondansetron (ZOFRAN) injection 4 mg (4 mg Intravenous Given 10/06/22 1459)  furosemide (LASIX) injection 60 mg (60 mg Intravenous Given 10/06/22 1514)    Reassessment after intervention: Symptoms slightly improved.    I decided to review pertinent External Data, and in summary patient's most recent ECHO in our system is from 09/06/2018 showing EF improved to 35-40%.  Clinical Laboratory Tests Ordered, included mild elevated troponin from 50-47.  Kidney function in range with prior values and known CKD.  BNP greater than 4500.  COVID and flu negative.  Radiologic Tests Ordered, included CXR. I independently interpreted the images and agree with radiology interpretation.   Cardiac Monitor Tracing which shows NSR.    Social Determinants of Health Risk patient is not an active smoker.   Medical Decision Making: Summary:  Patient presents to the emergency department for evaluation of chest pain and some abdominal discomfort as well.  Seems most of the symptoms are emanating from the abdomen based on his  description.  Mild tenderness on exam.  He definitely needs an ACS evaluation.  I do not appreciate any acute ischemic process on his initial EKG but will follow troponins.  Overall my suspicion for PE or dissection are very low.  Will assess kidney function, troponins, LFTs, lipase, reassess.   Reevaluation with update and discussion with patient.  CT of the chest, abdomen, pelvis pending.  Plan for Lasix and Zofran.  No hypoxemia.  Question if some of his abdominal discomfort could be from fluid retention in the abdomen.  CT pending.  Case signed out to Dr. Sharlett Iles pending follow-up after imaging.   Considered admission but workup pending.   Patient's presentation is most consistent with acute presentation with potential threat to life or bodily function.   Disposition: pending   ____________________________________________  FINAL CLINICAL IMPRESSION(S) / ED DIAGNOSES  Final diagnoses:  Precordial chest pain  Generalized abdominal pain    Note:  This document was prepared using Dragon voice recognition software and may include unintentional dictation errors.  Nanda Quinton, MD, [facility] Emergency Medicine    Tzippy Testerman, [facility], MD 10/06/22 [phone]

## 2022-10-06 NOTE — ED Provider Notes (Signed)
  Physical Exam  BP (!) 158/113 (BP Location: Right Arm)   Pulse 96   Temp 98.3 F (36.8 C) (Oral)   Resp 20   Ht 5\' 8"  (1.727 m)   Wt 86.2 kg   SpO2 95%   BMI 28.89 kg/m   Physical Exam  Procedures  Procedures  ED Course / MDM   Clinical Course as of 10/06/22 2047  Thu Oct 06, 2022  1551 67 yo M with hx of cardiomyopathy and CKD not on iHD who presents with epigastric abdominal pain radiating to his chest and n/v persistent for 1 week. Mild ttp on abdominal exam.  But not in any respiratory distress and is overall well-appearing.  Does have mild abdominal tenderness to palpation on exam.  Getting CT chest, abdomen, and pelvis wo contrast.  Labs show worsening creatinine with elevated but stable troponin and BNP that is undetectably high.  Felt that his symptoms are likely due to volume overload and potentially demand ischemia rather than ACS.  Pt requesting to go home. Plan on walking pulse ox if CT is unremarkable. [RP]  1959 Patient walked around the room for 2 minutes. Patient's pulse ox dropped from 98 to 94 but stayed around 96 the whole time. [RP]  2038 CT without acute findings.  Patient reassessed.  Says that his chest pain is much improved.  Discussed possible admission for his chest pain and diuresis versus ambulatory referral to cardiology and patient is requesting to follow-up with cardiology as an outpatient.  Instructed him to continue his Lasix at home.  Discussed return precautions prior to discharge and will also have him follow-up with his primary doctor in several days. [RP]    Clinical Course User Index [RP] Fransico Meadow, MD   Medical Decision Making Amount and/or Complexity of Data Reviewed Labs: ordered. Radiology: ordered.  Risk OTC drugs. Prescription drug management.      Fransico Meadow, MD 10/06/22 [phone]

## 2022-10-06 NOTE — ED Triage Notes (Signed)
CP radiating into jaw and abdomen with N/V. Pt reports intermittent CP x1 week but sudden onset of severe CP with dizziness occurring at approximately 0800.   Hx of NSTEMI in 2016

## 2022-10-06 NOTE — ED Notes (Signed)
Patient transported to X-ray 

## 2022-10-06 NOTE — ED Notes (Signed)
Patient walked around the room for 2 minutes. Patient's pulse ox dropped from 98 to 94 but stayed around 96 the whole time.

## 2022-11-15 ENCOUNTER — Inpatient Hospital Stay (HOSPITAL_COMMUNITY)
Admission: EM | Admit: 2022-11-15 | Discharge: 2022-11-20 | DRG: 682 | Disposition: A | Discharge status: Home / Self Care | Payer: 59 | Attending: Family Medicine | Admitting: Family Medicine

## 2022-11-15 DIAGNOSIS — Z79899 Other long term (current) drug therapy: Secondary | ICD-10-CM

## 2022-11-15 DIAGNOSIS — Z7982 Long term (current) use of aspirin: Secondary | ICD-10-CM

## 2022-11-15 DIAGNOSIS — W06XXXA Fall from bed, initial encounter: Secondary | ICD-10-CM | POA: Diagnosis present

## 2022-11-15 DIAGNOSIS — R531 Weakness: Principal | ICD-10-CM

## 2022-11-15 DIAGNOSIS — E860 Dehydration: Secondary | ICD-10-CM | POA: Diagnosis present

## 2022-11-15 DIAGNOSIS — N179 Acute kidney failure, unspecified: Secondary | ICD-10-CM | POA: Diagnosis not present

## 2022-11-15 DIAGNOSIS — F32A Depression, unspecified: Secondary | ICD-10-CM | POA: Diagnosis present

## 2022-11-15 DIAGNOSIS — M19072 Primary osteoarthritis, left ankle and foot: Secondary | ICD-10-CM | POA: Diagnosis present

## 2022-11-15 DIAGNOSIS — R4781 Slurred speech: Secondary | ICD-10-CM | POA: Diagnosis present

## 2022-11-15 DIAGNOSIS — N1832 Chronic kidney disease, stage 3b: Secondary | ICD-10-CM | POA: Diagnosis present

## 2022-11-15 DIAGNOSIS — R739 Hyperglycemia, unspecified: Secondary | ICD-10-CM | POA: Diagnosis present

## 2022-11-15 DIAGNOSIS — R001 Bradycardia, unspecified: Secondary | ICD-10-CM | POA: Diagnosis present

## 2022-11-15 DIAGNOSIS — I10 Essential (primary) hypertension: Secondary | ICD-10-CM | POA: Diagnosis present

## 2022-11-15 DIAGNOSIS — N189 Chronic kidney disease, unspecified: Secondary | ICD-10-CM | POA: Diagnosis present

## 2022-11-15 DIAGNOSIS — R2990 Unspecified symptoms and signs involving the nervous system: Secondary | ICD-10-CM | POA: Diagnosis present

## 2022-11-15 DIAGNOSIS — Z8673 Personal history of transient ischemic attack (TIA), and cerebral infarction without residual deficits: Secondary | ICD-10-CM

## 2022-11-15 DIAGNOSIS — I252 Old myocardial infarction: Secondary | ICD-10-CM

## 2022-11-15 DIAGNOSIS — Z91048 Other nonmedicinal substance allergy status: Secondary | ICD-10-CM

## 2022-11-15 DIAGNOSIS — I255 Ischemic cardiomyopathy: Secondary | ICD-10-CM | POA: Diagnosis present

## 2022-11-15 DIAGNOSIS — I5023 Acute on chronic systolic (congestive) heart failure: Secondary | ICD-10-CM | POA: Diagnosis present

## 2022-11-15 DIAGNOSIS — M7989 Other specified soft tissue disorders: Secondary | ICD-10-CM | POA: Diagnosis not present

## 2022-11-15 DIAGNOSIS — M109 Gout, unspecified: Secondary | ICD-10-CM | POA: Diagnosis present

## 2022-11-15 DIAGNOSIS — R0789 Other chest pain: Secondary | ICD-10-CM | POA: Diagnosis not present

## 2022-11-15 DIAGNOSIS — F1410 Cocaine abuse, uncomplicated: Secondary | ICD-10-CM | POA: Diagnosis present

## 2022-11-15 DIAGNOSIS — I130 Hypertensive heart and chronic kidney disease with heart failure and stage 1 through stage 4 chronic kidney disease, or unspecified chronic kidney disease: Secondary | ICD-10-CM | POA: Diagnosis present

## 2022-11-15 DIAGNOSIS — I959 Hypotension, unspecified: Secondary | ICD-10-CM | POA: Diagnosis present

## 2022-11-15 DIAGNOSIS — Z87891 Personal history of nicotine dependence: Secondary | ICD-10-CM

## 2022-11-15 DIAGNOSIS — I420 Dilated cardiomyopathy: Secondary | ICD-10-CM | POA: Diagnosis present

## 2022-11-15 DIAGNOSIS — E872 Acidosis, unspecified: Secondary | ICD-10-CM | POA: Diagnosis present

## 2022-11-15 DIAGNOSIS — G9341 Metabolic encephalopathy: Secondary | ICD-10-CM | POA: Diagnosis present

## 2022-11-15 DIAGNOSIS — E7800 Pure hypercholesterolemia, unspecified: Secondary | ICD-10-CM | POA: Diagnosis present

## 2022-11-15 DIAGNOSIS — I2510 Atherosclerotic heart disease of native coronary artery without angina pectoris: Secondary | ICD-10-CM | POA: Diagnosis present

## 2022-11-15 NOTE — ED Triage Notes (Signed)
Pt arrives via [facility] from home. Per report by EMS, pt said he fell out of bed, does not remember falling out of bed, woke up on the floor.  c/o weakness and sob. En route, brady 40-60, with BBB. Slurred speech is normal and facial droop is normal for him (reported previous stroke). 100/72, hr 40-60, 94% ra, placed on 2 liters, lower extremity edema. A/o x 4. Able to walk with assistance with his cane at the scene.

## 2022-11-16 ENCOUNTER — Emergency Department (HOSPITAL_COMMUNITY): Payer: 59

## 2022-11-16 ENCOUNTER — Encounter (HOSPITAL_COMMUNITY): Payer: Self-pay | Admitting: Emergency Medicine

## 2022-11-16 ENCOUNTER — Other Ambulatory Visit: Payer: Self-pay

## 2022-11-16 ENCOUNTER — Inpatient Hospital Stay (HOSPITAL_COMMUNITY): Payer: 59

## 2022-11-16 DIAGNOSIS — R2990 Unspecified symptoms and signs involving the nervous system: Secondary | ICD-10-CM | POA: Diagnosis not present

## 2022-11-16 DIAGNOSIS — M109 Gout, unspecified: Secondary | ICD-10-CM | POA: Diagnosis present

## 2022-11-16 DIAGNOSIS — M19072 Primary osteoarthritis, left ankle and foot: Secondary | ICD-10-CM | POA: Diagnosis present

## 2022-11-16 DIAGNOSIS — N179 Acute kidney failure, unspecified: Secondary | ICD-10-CM

## 2022-11-16 DIAGNOSIS — I10 Essential (primary) hypertension: Secondary | ICD-10-CM

## 2022-11-16 DIAGNOSIS — E7800 Pure hypercholesterolemia, unspecified: Secondary | ICD-10-CM | POA: Diagnosis present

## 2022-11-16 DIAGNOSIS — F1410 Cocaine abuse, uncomplicated: Secondary | ICD-10-CM | POA: Diagnosis present

## 2022-11-16 DIAGNOSIS — I252 Old myocardial infarction: Secondary | ICD-10-CM | POA: Diagnosis not present

## 2022-11-16 DIAGNOSIS — Z8673 Personal history of transient ischemic attack (TIA), and cerebral infarction without residual deficits: Secondary | ICD-10-CM | POA: Diagnosis not present

## 2022-11-16 DIAGNOSIS — N189 Chronic kidney disease, unspecified: Secondary | ICD-10-CM | POA: Diagnosis not present

## 2022-11-16 DIAGNOSIS — R739 Hyperglycemia, unspecified: Secondary | ICD-10-CM | POA: Diagnosis present

## 2022-11-16 DIAGNOSIS — F32A Depression, unspecified: Secondary | ICD-10-CM | POA: Diagnosis present

## 2022-11-16 DIAGNOSIS — I130 Hypertensive heart and chronic kidney disease with heart failure and stage 1 through stage 4 chronic kidney disease, or unspecified chronic kidney disease: Secondary | ICD-10-CM | POA: Diagnosis present

## 2022-11-16 DIAGNOSIS — I5023 Acute on chronic systolic (congestive) heart failure: Secondary | ICD-10-CM

## 2022-11-16 DIAGNOSIS — E860 Dehydration: Secondary | ICD-10-CM | POA: Diagnosis present

## 2022-11-16 DIAGNOSIS — E872 Acidosis, unspecified: Secondary | ICD-10-CM | POA: Diagnosis present

## 2022-11-16 DIAGNOSIS — I959 Hypotension, unspecified: Secondary | ICD-10-CM | POA: Diagnosis present

## 2022-11-16 DIAGNOSIS — G9341 Metabolic encephalopathy: Secondary | ICD-10-CM | POA: Diagnosis present

## 2022-11-16 DIAGNOSIS — N1832 Chronic kidney disease, stage 3b: Secondary | ICD-10-CM | POA: Diagnosis present

## 2022-11-16 DIAGNOSIS — I420 Dilated cardiomyopathy: Secondary | ICD-10-CM | POA: Diagnosis present

## 2022-11-16 DIAGNOSIS — Z79899 Other long term (current) drug therapy: Secondary | ICD-10-CM | POA: Diagnosis not present

## 2022-11-16 DIAGNOSIS — R079 Chest pain, unspecified: Secondary | ICD-10-CM | POA: Diagnosis not present

## 2022-11-16 DIAGNOSIS — R4781 Slurred speech: Secondary | ICD-10-CM | POA: Diagnosis present

## 2022-11-16 DIAGNOSIS — R7989 Other specified abnormal findings of blood chemistry: Secondary | ICD-10-CM | POA: Diagnosis not present

## 2022-11-16 DIAGNOSIS — I255 Ischemic cardiomyopathy: Secondary | ICD-10-CM | POA: Diagnosis present

## 2022-11-16 DIAGNOSIS — R001 Bradycardia, unspecified: Secondary | ICD-10-CM | POA: Diagnosis not present

## 2022-11-16 DIAGNOSIS — I2510 Atherosclerotic heart disease of native coronary artery without angina pectoris: Secondary | ICD-10-CM | POA: Diagnosis present

## 2022-11-16 DIAGNOSIS — Z7982 Long term (current) use of aspirin: Secondary | ICD-10-CM | POA: Diagnosis not present

## 2022-11-16 DIAGNOSIS — W06XXXA Fall from bed, initial encounter: Secondary | ICD-10-CM | POA: Diagnosis present

## 2022-11-16 DIAGNOSIS — Z87891 Personal history of nicotine dependence: Secondary | ICD-10-CM | POA: Diagnosis not present

## 2022-11-16 LAB — BASIC METABOLIC PANEL
Anion gap: 10 (ref 5–15)
BUN: 55 mg/dL — ABNORMAL HIGH (ref 8–23)
CO2: 18 mmol/L — ABNORMAL LOW (ref 22–32)
Calcium: 9 mg/dL (ref 8.9–10.3)
Chloride: 109 mmol/L (ref 98–111)
Creatinine, Ser: 3.07 mg/dL — ABNORMAL HIGH (ref 0.61–1.24)
GFR, Estimated: 21 mL/min — ABNORMAL LOW (ref >60)
Glucose, Bld: 154 mg/dL — ABNORMAL HIGH (ref 70–99)
Potassium: 5 mmol/L (ref 3.5–5.1)
Sodium: 137 mmol/L (ref 135–145)

## 2022-11-16 LAB — COMPREHENSIVE METABOLIC PANEL
ALT: 25 U/L (ref 0–44)
AST: 19 U/L (ref 15–41)
Albumin: 3.5 g/dL (ref 3.5–5.0)
Alkaline Phosphatase: 98 U/L (ref 38–126)
Anion gap: 6 (ref 5–15)
BUN: 48 mg/dL — ABNORMAL HIGH (ref 8–23)
CO2: 20 mmol/L — ABNORMAL LOW (ref 22–32)
Calcium: 9.1 mg/dL (ref 8.9–10.3)
Chloride: 113 mmol/L — ABNORMAL HIGH (ref 98–111)
Creatinine, Ser: 2.86 mg/dL — ABNORMAL HIGH (ref 0.61–1.24)
GFR, Estimated: 23 mL/min — ABNORMAL LOW (ref >60)
Glucose, Bld: 125 mg/dL — ABNORMAL HIGH (ref 70–99)
Potassium: 5.4 mmol/L — ABNORMAL HIGH (ref 3.5–5.1)
Sodium: 139 mmol/L (ref 135–145)
Total Bilirubin: 1.2 mg/dL (ref 0.3–1.2)
Total Protein: 5.8 g/dL — ABNORMAL LOW (ref 6.5–8.1)

## 2022-11-16 LAB — CBC
HCT: 45 % (ref 39.0–52.0)
Hemoglobin: 14.6 g/dL (ref 13.0–17.0)
MCH: 34.8 pg — ABNORMAL HIGH (ref 26.0–34.0)
MCHC: 32.4 g/dL (ref 30.0–36.0)
MCV: 107.1 fL — ABNORMAL HIGH (ref 80.0–100.0)
Platelets: 110 10*3/uL — ABNORMAL LOW (ref 150–400)
RBC: 4.2 MIL/uL — ABNORMAL LOW (ref 4.22–5.81)
RDW: 14.4 % (ref 11.5–15.5)
WBC: 4.9 10*3/uL (ref 4.0–10.5)
nRBC: 0 % (ref 0.0–0.2)

## 2022-11-16 LAB — RAPID URINE DRUG SCREEN, HOSP PERFORMED
Amphetamines: NOT DETECTED
Barbiturates: NOT DETECTED
Benzodiazepines: NOT DETECTED
Cocaine: POSITIVE — AB
Opiates: NOT DETECTED
Tetrahydrocannabinol: NOT DETECTED

## 2022-11-16 LAB — URINALYSIS, ROUTINE W REFLEX MICROSCOPIC
Bacteria, UA: NONE SEEN
Bilirubin Urine: NEGATIVE
Glucose, UA: NEGATIVE mg/dL
Hgb urine dipstick: NEGATIVE
Ketones, ur: NEGATIVE mg/dL
Leukocytes,Ua: NEGATIVE
Nitrite: NEGATIVE
Protein, ur: 100 mg/dL — AB
Specific Gravity, Urine: 1.009 (ref 1.005–1.030)
pH: 5 (ref 5.0–8.0)

## 2022-11-16 LAB — BRAIN NATRIURETIC PEPTIDE: B Natriuretic Peptide: 4270.9 pg/mL — ABNORMAL HIGH (ref 0.0–100.0)

## 2022-11-16 LAB — CREATININE, URINE, RANDOM: Creatinine, Urine: 154 mg/dL

## 2022-11-16 LAB — APTT: aPTT: 27 seconds (ref 24–36)

## 2022-11-16 LAB — TSH: TSH: 4.717 u[IU]/mL — ABNORMAL HIGH (ref 0.350–4.500)

## 2022-11-16 LAB — HIV ANTIBODY (ROUTINE TESTING W REFLEX): HIV Screen 4th Generation wRfx: NONREACTIVE

## 2022-11-16 LAB — LACTIC ACID, PLASMA: Lactic Acid, Venous: 1.9 mmol/L (ref 0.5–1.9)

## 2022-11-16 LAB — CBG MONITORING, ED
Glucose-Capillary: 140 mg/dL — ABNORMAL HIGH (ref 70–99)
Glucose-Capillary: 163 mg/dL — ABNORMAL HIGH (ref 70–99)

## 2022-11-16 LAB — TROPONIN I (HIGH SENSITIVITY)
Troponin I (High Sensitivity): 36 ng/L — ABNORMAL HIGH (ref <18)
Troponin I (High Sensitivity): 39 ng/L — ABNORMAL HIGH (ref <18)

## 2022-11-16 LAB — AMMONIA: Ammonia: 29 umol/L (ref 9–35)

## 2022-11-16 LAB — SODIUM, URINE, RANDOM: Sodium, Ur: 48 mmol/L

## 2022-11-16 LAB — PROTIME-INR
INR: 1.4 — ABNORMAL HIGH (ref 0.8–1.2)
Prothrombin Time: 17 seconds — ABNORMAL HIGH (ref 11.4–15.2)

## 2022-11-16 LAB — ETHANOL: Alcohol, Ethyl (B): <10 mg/dL (ref <10)

## 2022-11-16 MED ORDER — SODIUM CHLORIDE 0.9 % IV SOLN: INTRAVENOUS | Status: DC

## 2022-11-16 MED ORDER — ASPIRIN 81 MG PO TBEC
81.0000 mg | DELAYED_RELEASE_TABLET | Freq: Every day | ORAL | Status: DC
  Administered 2022-11-16 – 2022-11-20 (×5): 81 mg via ORAL
  Filled 2022-11-16 (×5): qty 1

## 2022-11-16 MED ORDER — HEPARIN SODIUM (PORCINE) 5000 UNIT/ML IJ SOLN
5000.0000 [IU] | Freq: Three times a day (TID) | INTRAMUSCULAR | Status: DC
  Administered 2022-11-16 – 2022-11-19 (×12): 5000 [IU] via SUBCUTANEOUS
  Filled 2022-11-16 (×12): qty 1

## 2022-11-16 MED ORDER — SODIUM CHLORIDE 0.9% FLUSH
3.0000 mL | Freq: Two times a day (BID) | INTRAVENOUS | Status: DC
  Administered 2022-11-16 – 2022-11-20 (×9): 3 mL via INTRAVENOUS

## 2022-11-16 MED ORDER — SODIUM CHLORIDE 0.9 % IV BOLUS (SEPSIS)
500.0000 mL | Freq: Once | INTRAVENOUS | Status: AC
  Administered 2022-11-16: 500 mL via INTRAVENOUS

## 2022-11-16 MED ORDER — FUROSEMIDE 10 MG/ML IJ SOLN
60.0000 mg | Freq: Two times a day (BID) | INTRAMUSCULAR | Status: DC
  Administered 2022-11-16 – 2022-11-20 (×8): 60 mg via INTRAVENOUS
  Filled 2022-11-16 (×9): qty 6

## 2022-11-16 NOTE — ED Notes (Signed)
ED TO INPATIENT HANDOFF REPORT  ED Nurse Name and Phone #: Etheleen Nicks Name/Age/Gender Jetta Lout 67 y.o. male Room/Bed: 032C/032C  Code Status   Code Status: Prior  Home/SNF/Other Home Patient oriented to: self, place, time, and situation Is this baseline? Yes   Triage Complete: Triage complete  Chief Complaint Acute kidney injury superimposed on chronic kidney disease (HCC) [N17.9, N18.9]  Triage Note Pt arrives via [facility] from home. Per report by EMS, pt said he fell out of bed, does not remember falling out of bed, woke up on the floor.  c/o weakness and sob. En route, brady 40-60, with BBB. Slurred speech is normal and facial droop is normal for him (reported previous stroke). 100/72, hr 40-60, 94% ra, placed on 2 liters, lower extremity edema. A/o x 4. Able to walk with assistance with his cane at the scene.   Pt says that he fell asleep and he fell out of the bed. He feels short of breath. Denies blood thinners, denies pain. Reports swelling in his legs. Says he feels like he has been "talking out the side of my mouth" for a couple of days. Denies hx of stroke, does report hx of MI.    Allergies Allergies  Allergen Reactions   Tape Rash    Please use paper tape    Level of Care/Admitting Diagnosis ED Disposition     ED Disposition  Admit   Condition  --   Comment  Hospital Area: [facility] [[id number]]  Level of Care: Telemetry Medical [[id number]]  May admit patient to [facility] or [facility] if equivalent level of care is available:: No  Covid Evaluation: Asymptomatic - no recent exposure (last 10 days) testing not required  Diagnosis: Acute kidney injury superimposed on chronic kidney disease [facility]) [[id number]]  Admitting Physician: Clydie Braun [[id number]]  Attending Physician: Clydie Braun [[id number]]  Certification:: I certify this patient will need inpatient services for at least 2 midnights  Estimated Length of Stay:  2          B Medical/Surgery History Past Medical History:  Diagnosis Date   Anxiety    Arthritis    "all over" (01/07/2015)   CKD (chronic kidney disease), stage IV (HCC)    Depression    Headache    "probably weekly" (01/07/2015)   History of echocardiogram 12/2014   EF 20-25% -> improved up to EF 35 and 40% February 2020:   Homelessness    Hypercholesterolemia    Hypertension    Ischemic dilated cardiomyopathy (HCC)    Noncompliance    Nonischemic dilated cardiomyopathy (HCC) 07/2014   Initial EF was 2025% with global hypokinesis.  As of February 2020 EF up to 35-40%, GRII DD.  Global hypokinesis.  No WMA.  Relatively normal valves.   NSTEMI (non-ST elevated myocardial infarction) [facility]) 2016   2 separate episodes in January and June.  Normal coronary arteries by cardiac catheterization.  Thought to be related to.   Polysubstance abuse (HCC)    etoh, cocaine   Urinary hesitancy    Walking pneumonia 07/2014   Past Surgical History:  Procedure Laterality Date   LEFT HEART CATHETERIZATION WITH CORONARY ANGIOGRAM N/A 08/15/2014   Procedure: LEFT HEART CATHETERIZATION WITH CORONARY ANGIOGRAM;  Surgeon: Robynn Pane, MD;  Location: [facility] CATH LAB;  Service: Cardiovascular;; normal coronary arteries.  Severely elevated LVEDP of 35 mmHg.   TRANSTHORACIC ECHOCARDIOGRAM  08/2018   EF 35-40% (up from 20-25% in 2017).  No MR WMA.  Global HK.  GRII DD.  Normal valves.     A IV Location/Drains/Wounds Patient Lines/Drains/Airways Status     Active Line/Drains/Airways     Name Placement date Placement time Site Days   Peripheral IV 11/16/22 20 G Right Antecubital 11/16/22  0059  Antecubital  less than 1   Peripheral IV 11/16/22 20 G Anterior;Distal;Right Forearm 11/16/22  0137  Forearm  less than 1            Intake/Output Last 24 hours No intake or output data in the 24 hours ending 11/16/22 0729  Labs/Imaging Results for orders placed or performed during the hospital  encounter of 11/15/22 (from the past 48 hour(s))  Basic metabolic panel     Status: Abnormal   Collection Time: 11/15/22 11:58 PM  Result Value Ref Range   Sodium 137 135 - 145 mmol/L   Potassium 5.0 3.5 - 5.1 mmol/L   Chloride 109 98 - 111 mmol/L   CO2 18 (L) 22 - 32 mmol/L   Glucose, Bld 154 (H) 70 - 99 mg/dL    Comment: Glucose reference range applies only to samples taken after fasting for at least 8 hours.   BUN 55 (H) 8 - 23 mg/dL   Creatinine, Ser 9.60 (H) 0.61 - 1.24 mg/dL   Calcium 9.0 8.9 - 45.4 mg/dL   GFR, Estimated 21 (L) >60 mL/min    Comment: (NOTE) Calculated using the CKD-EPI Creatinine Equation (2021)    Anion gap 10 5 - 15    Comment: Performed at [facility], [address], [place], [place] [address]  CBC     Status: Abnormal   Collection Time: 11/15/22 11:58 PM  Result Value Ref Range   WBC 4.9 4.0 - 10.5 K/uL   RBC 4.20 (L) 4.22 - 5.81 MIL/uL   Hemoglobin 14.6 13.0 - 17.0 g/dL   HCT 91.4 78.2 - 95.6 %   MCV 107.1 (H) 80.0 - 100.0 fL   MCH 34.8 (H) 26.0 - 34.0 pg   MCHC 32.4 30.0 - 36.0 g/dL   RDW 21.3 08.6 - 57.8 %   Platelets 110 (L) 150 - 400 K/uL    Comment: REPEATED TO VERIFY   nRBC 0.0 0.0 - 0.2 %    Comment: Performed at [facility], [address], [place], [place] [address]  Troponin I (High Sensitivity)     Status: Abnormal   Collection Time: 11/15/22 11:58 PM  Result Value Ref Range   Troponin I (High Sensitivity) 36 (H) <18 ng/L    Comment: (NOTE) Elevated high sensitivity troponin I (hsTnI) values and significant  changes across serial measurements may suggest ACS but many other  chronic and acute conditions are known to elevate hsTnI results.  Refer to the "Links" section for chest pain algorithms and additional  guidance. Performed at [facility], [address], [place], [place] [address]   CBG monitoring, ED     Status: Abnormal   Collection Time: 11/16/22 12:05 AM  Result Value Ref Range   Glucose-Capillary  163 (H) 70 - 99 mg/dL    Comment: Glucose reference range applies only to samples taken after fasting for at least 8 hours.  CBG monitoring, ED     Status: Abnormal   Collection Time: 11/16/22 12:34 AM  Result Value Ref Range   Glucose-Capillary 140 (H) 70 - 99 mg/dL    Comment: Glucose reference range applies only to samples taken after fasting  for at least 8 hours.  Urinalysis, Routine w reflex microscopic -Urine, Clean Catch     Status: Abnormal   Collection Time: 11/16/22  1:25 AM  Result Value Ref Range   Color, Urine YELLOW YELLOW   APPearance CLEAR CLEAR   Specific Gravity, Urine 1.009 1.005 - 1.030   pH 5.0 5.0 - 8.0   Glucose, UA NEGATIVE NEGATIVE mg/dL   Hgb urine dipstick NEGATIVE NEGATIVE   Bilirubin Urine NEGATIVE NEGATIVE   Ketones, ur NEGATIVE NEGATIVE mg/dL   Protein, ur 161 (A) NEGATIVE mg/dL   Nitrite NEGATIVE NEGATIVE   Leukocytes,Ua NEGATIVE NEGATIVE   RBC / HPF 0-5 0 - 5 RBC/hpf   WBC, UA 0-5 0 - 5 WBC/hpf   Bacteria, UA NONE SEEN NONE SEEN   Squamous Epithelial / HPF 0-5 0 - 5 /HPF   Mucus PRESENT    Hyaline Casts, UA PRESENT     Comment: Performed at [facility], [address] [place], [place] [address]  Lactic acid, plasma     Status: None   Collection Time: 11/16/22  1:37 AM  Result Value Ref Range   Lactic Acid, Venous 1.9 0.5 - 1.9 mmol/L    Comment: Performed at [facility], [address], [place], [place] [address]  Protime-INR     Status: Abnormal   Collection Time: 11/16/22  1:37 AM  Result Value Ref Range   Prothrombin Time 17.0 (H) 11.4 - 15.2 seconds   INR 1.4 (H) 0.8 - 1.2    Comment: (NOTE) INR goal varies based on device and disease states. Performed at [facility], [address] [place], [place] [address]   APTT     Status: None   Collection Time: 11/16/22  1:37 AM  Result Value Ref Range   aPTT 27 24 - 36 seconds    Comment: Performed at [facility], [address], [place], [place] [address]   Troponin I (High Sensitivity)     Status: Abnormal   Collection Time: 11/16/22  1:37 AM  Result Value Ref Range   Troponin I (High Sensitivity) 39 (H) <18 ng/L    Comment: (NOTE) Elevated high sensitivity troponin I (hsTnI) values and significant  changes across serial measurements may suggest ACS but many other  chronic and acute conditions are known to elevate hsTnI results.  Refer to the "Links" section for chest pain algorithms and additional  guidance. Performed at [facility], [address], [place], [place] [address]   Ethanol     Status: None   Collection Time: 11/16/22  2:00 AM  Result Value Ref Range   Alcohol, Ethyl (B) <10 <10 mg/dL    Comment: (NOTE) Lowest detectable limit for serum alcohol is 10 mg/dL.  For medical purposes only. Performed at [facility], [address], [place], [place] [address]    CT Head Wo Contrast  Result Date: 11/16/2022 CLINICAL DATA:  Recent fall with headaches, initial encounter EXAM: CT HEAD WITHOUT CONTRAST TECHNIQUE: Contiguous axial images were obtained from the base of the skull through the vertex without intravenous contrast. RADIATION DOSE REDUCTION: This exam was performed according to the departmental dose-optimization program which includes automated exposure control, adjustment of the mA and/or kV according to patient size and/or use of iterative reconstruction technique. COMPARISON:  None Available. FINDINGS: Brain: No evidence of acute infarction, hemorrhage, hydrocephalus, extra-axial collection or mass lesion/mass effect. Mild atrophic changes are noted. Vascular: No hyperdense vessel  or unexpected calcification. Skull: Normal. Negative for fracture or focal lesion. Sinuses/Orbits: No acute finding. Other: None IMPRESSION: Mild atrophic changes without acute abnormality. Electronically Signed   By: Alcide Clever M.D.   On: 11/16/2022 02:35   DG CHEST PORT 1 VIEW  Result Date: 11/16/2022 CLINICAL DATA:  Shortness of  breath. EXAM: PORTABLE CHEST 1 VIEW COMPARISON:  October 06, 2022 FINDINGS: The cardiac silhouette is moderately enlarged and mildly increased in size when compared to the prior study. This may be, in part, technical in origin. Mild to moderate severity calcification of the aortic arch is seen. Low lung volumes are noted. Both lungs are clear. The visualized skeletal structures are unremarkable. IMPRESSION: Cardiomegaly and low lung volumes without evidence of acute or active cardiopulmonary disease. Electronically Signed   By: Aram Candela M.D.   On: 11/16/2022 01:00    Pending Labs Unresulted Labs (From admission, onward)     Start     Ordered   11/16/22 0338  Rapid urine drug screen (hospital performed)  ONCE - STAT,   STAT        11/16/22 0337   11/16/22 0046  Blood Culture (routine x 2)  (Undifferentiated presentation (screening labs and basic nursing orders))  BLOOD CULTURE X 2,   STAT      11/16/22 0045            Vitals/Pain Today's Vitals   11/16/22 0615 11/16/22 0645 11/16/22 0700 11/16/22 0708  BP:  126/82 109/83   Pulse: (!) 46  (!) 39   Resp:  16 16   Temp:    98 F (36.7 C)  TempSrc:    Oral  SpO2:  100% 98%   Weight:      Height:      PainSc:        Isolation Precautions No active isolations  Medications Medications  0.9 %  sodium chloride infusion (has no administration in time range)  sodium chloride 0.9 % bolus 500 mL (0 mLs Intravenous Stopped 11/16/22 0141)    Mobility walks     Focused Assessments Neuro Assessment Handoff:  Swallow screen pass? Yes    NIH Stroke Scale  Dizziness Present: No Headache Present: No Interval: Initial Level of Consciousness (1a.)   : Alert, keenly responsive LOC Questions (1b. )   : Answers both questions correctly LOC Commands (1c. )   : Performs both tasks correctly Best Gaze (2. )  : Normal Visual (3. )  : No visual loss Facial Palsy (4. )    : Normal symmetrical movements Motor Arm, Left (5a. )   : No  drift Motor Arm, Right (5b. ) : No drift Motor Leg, Left (6a. )  : No drift Motor Leg, Right (6b. ) : No drift Limb Ataxia (7. ): Absent Sensory (8. )  : Normal, no sensory loss Best Language (9. )  : No aphasia Dysarthria (10. ): Mild-to-moderate dysarthria, patient slurs at least some words and, at worst, can be understood with some difficulty Extinction/Inattention (11.)   : No Abnormality Complete NIHSS TOTAL: 1     Neuro Assessment: Exceptions to WDL Neuro Checks:   Initial (11/16/22 0353)  Has TPA been given? No If patient is a Neuro Trauma and patient is going to OR before floor call report to 4N Charge nurse: [phone] or [phone]   R Recommendations: See Admitting Provider Note  Report given to:   Additional Notes:

## 2022-11-16 NOTE — ED Provider Notes (Signed)
[place] EMERGENCY DEPARTMENT AT [facility] Provider Note   CSN: [id number] Arrival date & time: 11/15/22  2351     History  Chief Complaint  Patient presents with   Weakness   Level 5 caveat due to acuity of condition [name] is a 67 y.o. male.  The history is provided by the patient.  Patient with history of anxiety, chronic kidney disease, cardiomyopathy presents with generalized weakness Patient reports he woke up in the floor after going to bed earlier in the night.  He denies any traumatic injury but he may have hit his head. Patient reports just having generalized weakness.  No new medicines  He does not have any new pain complaints at this time Past Medical History:  Diagnosis Date   Anxiety    Arthritis    "all over" (01/07/2015)   CKD (chronic kidney disease), stage IV (HCC)    Depression    Headache    "probably weekly" (01/07/2015)   History of echocardiogram 12/2014   EF 20-25% -> improved up to EF 35 and 40% February 2020:   Homelessness    Hypercholesterolemia    Hypertension    Ischemic dilated cardiomyopathy (HCC)    Noncompliance    Nonischemic dilated cardiomyopathy (HCC) 07/2014   Initial EF was 2025% with global hypokinesis.  As of February 2020 EF up to 35-40%, GRII DD.  Global hypokinesis.  No WMA.  Relatively normal valves.   NSTEMI (non-ST elevated myocardial infarction) [facility]) 2016   2 separate episodes in January and June.  Normal coronary arteries by cardiac catheterization.  Thought to be related to.   Polysubstance abuse (HCC)    etoh, cocaine   Urinary hesitancy    Walking pneumonia 07/2014    Home Medications Prior to Admission medications   Medication Sig Start Date End Date Taking? Authorizing Provider  aspirin 81 MG EC tablet Take 1 tablet (81 mg total) by mouth daily. 01/06/17  Yes Hoy Register, MD  albuterol (VENTOLIN HFA) 108 (90 Base) MCG/ACT inhaler Inhale 2 puffs into the lungs every 4 (four) hours as  needed for wheezing or shortness of breath. 08/31/22   Horton, Mayer Masker, MD  allopurinol (ZYLOPRIM) 300 MG tablet Take 0.5 tablets (150 mg total) by mouth daily. 05/08/19   Hoy Register, MD  b complex vitamins tablet Take 1 tablet by mouth daily.    [provider]  carvedilol (COREG) 3.125 MG tablet Take 1 tablet (3.125 mg total) by mouth 2 (two) times daily. Please call to make appointment. 07/15/19 10/06/22  Hoy Register, MD  clotrimazole-betamethasone (LOTRISONE) cream Apply 1 application topically 2 (two) times daily. 02/04/19   Hoy Register, MD  doxycycline (VIBRAMYCIN) 100 MG capsule Take 1 capsule (100 mg total) by mouth 2 (two) times daily. 08/31/22   Horton, Mayer Masker, MD  Doxylamine-DM (VICKS DAYQUIL/NYQUIL COUGH PO) Take 2 tablets by mouth 2 (two) times daily as needed (cough/congestion).    [provider]  furosemide (LASIX) 40 MG tablet Take 1 tablet (40 mg total) by mouth 2 (two) times daily for 30 days. Patient not taking: Reported on 07/06/2019 11/01/18 12/01/18  Hoy Register, MD  furosemide (LASIX) 80 MG tablet Take 40 mg by mouth 2 (two) times daily. 06/06/19   [provider]  gabapentin (NEURONTIN) 300 MG capsule Take 2 capsules (600 mg total) by mouth 2 (two) times daily. 01/13/20   Hoy Register, MD  guaiFENesin (MUCINEX PO) Take 1 Dose by mouth 2 (two) times daily  as needed (cough/congestion).    [provider]  guaifenesin (ROBITUSSIN) 100 MG/5ML syrup Take 200 mg by mouth at bedtime as needed for cough.    [provider]  Misc. Devices MISC Blood pressure monitor Dx: Hypertension, CHF 11/01/18   Hoy Register, MD  nitroGLYCERIN (NITROSTAT) 0.4 MG SL tablet Place 1 tablet (0.4 mg total) under the tongue every 5 (five) minutes x 3 doses as needed for chest pain. 01/06/17   Hoy Register, MD  pantoprazole (PROTONIX) 40 MG tablet Take 40 mg by mouth daily. 05/14/19   [provider]  Phenyleph-CPM-DM-APAP  (ALKA-SELTZER PLUS COLD & FLU PO) Take 1 Dose by mouth at bedtime as needed (cough/congestion).    [provider]  potassium chloride SA (K-DUR,KLOR-CON) 20 MEQ tablet Take 20 mEq by mouth daily. 06/04/16   [provider]  sacubitril-valsartan (ENTRESTO) 24-26 MG Take 1 tablet by mouth 2 (two) times daily. Patient not taking: Reported on 07/06/2019 11/01/18   Hoy Register, MD  sacubitril-valsartan (ENTRESTO) 49-51 MG Take 2 tablets by mouth 2 (two) times daily.    [provider]  tiZANidine (ZANAFLEX) 4 MG tablet Take 1 tablet (4 mg total) by mouth every 6 (six) hours as needed for muscle spasms. MUST MAKE APPT FOR FURTHER REFILLS 02/22/19   Hoy Register, MD      Allergies    Tape    Review of Systems   Review of Systems  Unable to perform ROS: Acuity of condition    Physical Exam Updated Vital Signs BP 118/80 (BP Location: Right Arm)   Pulse 66   Temp 97.9 F (36.6 C) (Oral)   Resp (!) 28   Ht 1.727 m (5\' 8" )   Wt 86.2 kg   SpO2 99%   BMI 28.89 kg/m  Physical Exam CONSTITUTIONAL: Chronically ill-appearing HEAD: Normocephalic/atraumatic, no visible trauma EYES: EOMI/PERRL ENMT: Mucous membranes dry, missing upper teeth NECK: supple no meningeal signs No bruising/crepitance/stepoffs noted to spine CV: S1/S2 noted, no murmurs/rubs/gallops noted LUNGS: Lungs are clear to auscultation bilaterally, no apparent distress ABDOMEN: soft, nontender, no rebound or guarding, bowel sounds noted throughout abdomen GU:no cva tenderness NEURO: Pt is awake/alert/appropriate, moves all extremitiesx4.  No facial droop.  No arm or leg drift EXTREMITIES: pulses normal/equal, full ROM All extremities/joints palpated/ranged and nontender SKIN: warm, color normal  ED Results / Procedures / Treatments   Labs (all labs ordered are listed, but only abnormal results are displayed) Labs Reviewed  BASIC METABOLIC PANEL - Abnormal; Notable for the following  components:      Result Value   CO2 18 (*)    Glucose, Bld 154 (*)    BUN 55 (*)    Creatinine, Ser 3.07 (*)    GFR, Estimated 21 (*)    All other components within normal limits  CBC - Abnormal; Notable for the following components:   RBC 4.20 (*)    MCV 107.1 (*)    MCH 34.8 (*)    Platelets 110 (*)    All other components within normal limits  PROTIME-INR - Abnormal; Notable for the following components:   Prothrombin Time 17.0 (*)    INR 1.4 (*)    All other components within normal limits  URINALYSIS, ROUTINE W REFLEX MICROSCOPIC - Abnormal; Notable for the following components:   Protein, ur 100 (*)    All other components within normal limits  CBG MONITORING, ED - Abnormal; Notable for the following components:   Glucose-Capillary 163 (*)  All other components within normal limits  CBG MONITORING, ED - Abnormal; Notable for the following components:   Glucose-Capillary 140 (*)    All other components within normal limits  TROPONIN I (HIGH SENSITIVITY) - Abnormal; Notable for the following components:   Troponin I (High Sensitivity) 36 (*)    All other components within normal limits  TROPONIN I (HIGH SENSITIVITY) - Abnormal; Notable for the following components:   Troponin I (High Sensitivity) 39 (*)    All other components within normal limits  CULTURE, BLOOD (ROUTINE X 2)  CULTURE, BLOOD (ROUTINE X 2)  LACTIC ACID, PLASMA  APTT  ETHANOL  RAPID URINE DRUG SCREEN, HOSP PERFORMED    EKG EKG Interpretation  Date/Time:  Tuesday November 15 2022 23:46:47 EDT Ventricular Rate:  61 PR Interval:  222 QRS Duration: 192 QT Interval:  552 QTC Calculation: 555 R Axis:   245 Text Interpretation: Sinus rhythm with 1st degree A-V block Right bundle branch block Inferior infarct , age undetermined Anteroseptal infarct , age undetermined Abnormal ECG When compared with ECG of 06-Oct-2022 10:30, PREVIOUS ECG IS PRESENT Confirmed by Zadie Rhine ([id number]) on 11/16/2022 12:46:53  AM  Radiology CT Head Wo Contrast  Result Date: 11/16/2022 CLINICAL DATA:  Recent fall with headaches, initial encounter EXAM: CT HEAD WITHOUT CONTRAST TECHNIQUE: Contiguous axial images were obtained from the base of the skull through the vertex without intravenous contrast. RADIATION DOSE REDUCTION: This exam was performed according to the departmental dose-optimization program which includes automated exposure control, adjustment of the mA and/or kV according to patient size and/or use of iterative reconstruction technique. COMPARISON:  None Available. FINDINGS: Brain: No evidence of acute infarction, hemorrhage, hydrocephalus, extra-axial collection or mass lesion/mass effect. Mild atrophic changes are noted. Vascular: No hyperdense vessel or unexpected calcification. Skull: Normal. Negative for fracture or focal lesion. Sinuses/Orbits: No acute finding. Other: None IMPRESSION: Mild atrophic changes without acute abnormality. Electronically Signed   By: Alcide Clever M.D.   On: 11/16/2022 02:35   DG CHEST PORT 1 VIEW  Result Date: 11/16/2022 CLINICAL DATA:  Shortness of breath. EXAM: PORTABLE CHEST 1 VIEW COMPARISON:  October 06, 2022 FINDINGS: The cardiac silhouette is moderately enlarged and mildly increased in size when compared to the prior study. This may be, in part, technical in origin. Mild to moderate severity calcification of the aortic arch is seen. Low lung volumes are noted. Both lungs are clear. The visualized skeletal structures are unremarkable. IMPRESSION: Cardiomegaly and low lung volumes without evidence of acute or active cardiopulmonary disease. Electronically Signed   By: Aram Candela M.D.   On: 11/16/2022 01:00    Procedures .Critical Care  Performed by: Zadie Rhine, MD Authorized by: Zadie Rhine, MD   Critical care provider statement:    Critical care time (minutes):  45   Critical care start time:  11/16/2022 1:45 AM   Critical care end time:  11/16/2022 2:30  AM   Critical care time was exclusive of:  Separately billable procedures and treating other patients   Critical care was necessary to treat or prevent imminent or life-threatening deterioration of the following conditions:  Metabolic crisis, sepsis, shock and dehydration   Critical care was time spent personally by me on the following activities:  Examination of patient, development of treatment plan with patient or surrogate, ordering and review of laboratory studies, ordering and review of radiographic studies, pulse oximetry, re-evaluation of patient's condition, ordering and performing treatments and interventions, review of old charts and evaluation of  patient's response to treatment   I assumed direction of critical care for this patient from another provider in my specialty: no     Care discussed with: admitting provider       Medications Ordered in ED Medications  sodium chloride 0.9 % bolus 500 mL (0 mLs Intravenous Stopped 11/16/22 0141)    ED Course/ Medical Decision Making/ A&P Clinical Course as of 11/16/22 0435  Wed Nov 16, 2022  0145 Creatinine(!): 3.07 Acute kidney injury [DW]  0145 Glucose(!): 154 Hyperglycemia [DW]  0151 I saw patient initially when he was hypotensive and was a difficult historian.  Blood pressure is now improving.  He appears dehydrated.  There was some reports of a previous stroke by nursing, but there is no signs of acute CVA at this time.  Workup pending at this time [DW]  0214 Patient with some improvement of his blood pressure, resting comfortably. [DW]  0347 Blood pressure is improving, but still unclear cause of his hypotension.  Patient denies history of CVA, but reports generalized weakness.  He has no obvious facial droop, no arm or leg drift.  Patient does report that his speech has been off for at least a day and a half.  He is a poor historian.  He will be admitted for further workup [DW]  [phone] Discussed with Dr. Loney Loh for admission [DW]   [phone] Patient has had episodes of bradycardia, though appears to be sinus without AV block.  His heart rate does drop some when sleeping but easily corrects. [DW]    Clinical Course User Index [DW] Zadie Rhine, MD                             Medical Decision Making Amount and/or Complexity of Data Reviewed Labs: ordered. Decision-making details documented in ED Course. Radiology: ordered.  Risk Decision regarding hospitalization.   This patient presents to the ED for concern of weakness, this involves an extensive number of treatment options, and is a complaint that carries with it a high risk of complications and morbidity.  The differential diagnosis includes but is not limited to CVA, intracranial hemorrhage, acute coronary syndrome, renal failure, urinary tract infection, electrolyte disturbance, pneumonia   Comorbidities that complicate the patient evaluation: Patient's presentation is complicated by their history of cardiomyopathy  Social Determinants of Health: Patient's  previous history of substance use disorder   increases the complexity of managing their presentation  Additional history obtained: Records reviewed  outpatient records reviewed  Lab Tests: I Ordered, and personally interpreted labs.  The pertinent results include: Acute kidney injury, hyperglycemia  Imaging Studies ordered: I ordered imaging studies including CT scan head and X-ray chest   I independently visualized and interpreted imaging which showed no acute findings I agree with the radiologist interpretation  Cardiac Monitoring: The patient was maintained on a cardiac monitor.  I personally viewed and interpreted the cardiac monitor which showed an underlying rhythm of:  sinus rhythm  Medicines ordered and prescription drug management: I ordered medication including IV fluids for dehydration Reevaluation of the patient after these medicines showed that the patient    improved   Critical  Interventions:   IV fluids and admission  Consultations Obtained: I requested consultation with the admitting physician [facility] , and discussed  findings as well as pertinent plan - they recommend: Admit  Reevaluation: After the interventions noted above, I reevaluated the patient and found that they have :improved  Complexity of problems addressed: Patient's presentation is most consistent with  acute presentation with potential threat to life or bodily function  Disposition: After consideration of the diagnostic results and the patient's response to treatment,  I feel that the patent would benefit from admission   .           Final Clinical Impression(s) / ED Diagnoses Final diagnoses:  Weakness  Dehydration  AKI (acute kidney injury) [facility])    Rx / DC Orders ED Discharge Orders     None         Zadie Rhine, MD 11/16/22 [phone]

## 2022-11-16 NOTE — ED Notes (Signed)
Pt resting comfortably at this time.

## 2022-11-16 NOTE — Plan of Care (Signed)

## 2022-11-16 NOTE — ED Triage Notes (Signed)
Pt says that he fell asleep and he fell out of the bed. He feels short of breath. Denies blood thinners, denies pain. Reports swelling in his legs. Says he feels like he has been "talking out the side of my mouth" for a couple of days. Denies hx of stroke, does report hx of MI.

## 2022-11-16 NOTE — Progress Notes (Signed)
Rapid Response Note  Called for NIHSS assistance. Patient arrived from ED overnight, orders for NIH (3), see flowsheets. Patient A&Ox4, in bed just finished eating breakfast and talking on phone with significant other Marcelino Duster. Patient and significant other Marcelino Duster (over phone) states slurred speech and facial droop started last week as well as transient tingling in left finger. Patient also developed bilateral +1 pitting edema in lower extremities and began to feel poorly. He arrived to ED 4/30 evening after falling out of bed.   Q4h NIHSS checks per MD R. Lind Covert Narayan Scull Rapid Response Nurse

## 2022-11-16 NOTE — H&P (Signed)
History and Physical    Patient: [name] ZOX:[id number] DOB: [birth year] DOA: 11/15/2022 DOS: the patient was seen and examined on 11/16/2022 PCP: Raymon Mutton., FNP  Patient coming from: Home lives alone  Chief Complaint:  Chief Complaint  Patient presents with   Weakness   HPI: [name] is a 67 y.o. male with medical history significant of hypertension, hyperlipidemia, systolic CHF, ischemic cardiomyopathy, CAD,  CKD stage IIIb, polysubstance abuse who presents after falling out of the bed last night.  He recalls is waking up on the floor.  He thinks that he hit his head as he had some head and neck soreness after waking up.  Over the last week patient had reported complaints of weakness, shortness of breath, and slurred speech.  He had also reported having some intermittent right-sided chest pain, and lower extremity swelling which is new.  He reports that normally his speech is not as slurred and his friend had told him that his face was drooping on the right-hand side.  He reports that he ambulates with use of a cane.  He did admit to using cocaine 2 days ago.  Patient also reports that sometimes he gets confused with what medicines he is supposed to take  In the emergency department patient was noted to be afebrile with pulse 43-66, respirations 15-29, and all other vital signs relatively maintained.  Labs noted elevated MCV and MCH, platelets 110, CO2 18, BUN 55, creatinine 3.07, glucose 154, anion gap 10, alcohol level undetectable, and high-sensitivity troponin 36.  Chest x-ray noted cardiomegaly without any acute abnormality.  Urinalysis showed no acute abnormality.  CT scan of the head noted mild atrophic changes without acute abnormality.  Patient has been bolused 500 mL of normal saline IV fluids.  Review of Systems: As mentioned in the history of present illness. All other systems reviewed and are negative. Past Medical History:  Diagnosis Date   Anxiety    Arthritis     "all over" (01/07/2015)   CKD (chronic kidney disease), stage IV (HCC)    Depression    Headache    "probably weekly" (01/07/2015)   History of echocardiogram 12/2014   EF 20-25% -> improved up to EF 35 and 40% February 2020:   Homelessness    Hypercholesterolemia    Hypertension    Ischemic dilated cardiomyopathy (HCC)    Noncompliance    Nonischemic dilated cardiomyopathy (HCC) 07/2014   Initial EF was 2025% with global hypokinesis.  As of February 2020 EF up to 35-40%, GRII DD.  Global hypokinesis.  No WMA.  Relatively normal valves.   NSTEMI (non-ST elevated myocardial infarction) [facility]) 2016   2 separate episodes in January and June.  Normal coronary arteries by cardiac catheterization.  Thought to be related to.   Polysubstance abuse (HCC)    etoh, cocaine   Urinary hesitancy    Walking pneumonia 07/2014   Past Surgical History:  Procedure Laterality Date   LEFT HEART CATHETERIZATION WITH CORONARY ANGIOGRAM N/A 08/15/2014   Procedure: LEFT HEART CATHETERIZATION WITH CORONARY ANGIOGRAM;  Surgeon: Robynn Pane, MD;  Location: [facility] CATH LAB;  Service: Cardiovascular;; normal coronary arteries.  Severely elevated LVEDP of 35 mmHg.   TRANSTHORACIC ECHOCARDIOGRAM  08/2018   EF 35-40% (up from 20-25% in 2017).  No MR WMA.  Global HK.  GRII DD.  Normal valves.   Social History:  reports that he quit smoking about 18 years ago. His smoking use included cigarettes. He has a 45.00  pack-year smoking history. He has quit using smokeless tobacco. He reports current alcohol use of about 2.0 - 4.0 standard drinks of alcohol per week. He reports that he does not use drugs.  Allergies  Allergen Reactions   Tape Rash    Please use paper tape    History reviewed. No pertinent family history.  Prior to Admission medications   Medication Sig Start Date End Date Taking? Authorizing Provider  aspirin 81 MG EC tablet Take 1 tablet (81 mg total) by mouth daily. 01/06/17  Yes Hoy Register, MD   albuterol (VENTOLIN HFA) 108 (90 Base) MCG/ACT inhaler Inhale 2 puffs into the lungs every 4 (four) hours as needed for wheezing or shortness of breath. 08/31/22   Horton, Mayer Masker, MD  allopurinol (ZYLOPRIM) 300 MG tablet Take 0.5 tablets (150 mg total) by mouth daily. 05/08/19   Hoy Register, MD  b complex vitamins tablet Take 1 tablet by mouth daily.    [provider]  carvedilol (COREG) 3.125 MG tablet Take 1 tablet (3.125 mg total) by mouth 2 (two) times daily. Please call to make appointment. 07/15/19 10/06/22  Hoy Register, MD  clotrimazole-betamethasone (LOTRISONE) cream Apply 1 application topically 2 (two) times daily. 02/04/19   Hoy Register, MD  doxycycline (VIBRAMYCIN) 100 MG capsule Take 1 capsule (100 mg total) by mouth 2 (two) times daily. 08/31/22   Horton, Mayer Masker, MD  Doxylamine-DM (VICKS DAYQUIL/NYQUIL COUGH PO) Take 2 tablets by mouth 2 (two) times daily as needed (cough/congestion).    [provider]  furosemide (LASIX) 40 MG tablet Take 1 tablet (40 mg total) by mouth 2 (two) times daily for 30 days. Patient not taking: Reported on 07/06/2019 11/01/18 12/01/18  Hoy Register, MD  furosemide (LASIX) 80 MG tablet Take 40 mg by mouth 2 (two) times daily. 06/06/19   [provider]  gabapentin (NEURONTIN) 300 MG capsule Take 2 capsules (600 mg total) by mouth 2 (two) times daily. 01/13/20   Hoy Register, MD  guaiFENesin (MUCINEX PO) Take 1 Dose by mouth 2 (two) times daily as needed (cough/congestion).    [provider]  guaifenesin (ROBITUSSIN) 100 MG/5ML syrup Take 200 mg by mouth at bedtime as needed for cough.    [provider]  Misc. Devices MISC Blood pressure monitor Dx: Hypertension, CHF 11/01/18   Hoy Register, MD  nitroGLYCERIN (NITROSTAT) 0.4 MG SL tablet Place 1 tablet (0.4 mg total) under the tongue every 5 (five) minutes x 3 doses as needed for chest pain. 01/06/17   Hoy Register, MD  pantoprazole  (PROTONIX) 40 MG tablet Take 40 mg by mouth daily. 05/14/19   [provider]  Phenyleph-CPM-DM-APAP (ALKA-SELTZER PLUS COLD & FLU PO) Take 1 Dose by mouth at bedtime as needed (cough/congestion).    [provider]  potassium chloride SA (K-DUR,KLOR-CON) 20 MEQ tablet Take 20 mEq by mouth daily. 06/04/16   [provider]  sacubitril-valsartan (ENTRESTO) 24-26 MG Take 1 tablet by mouth 2 (two) times daily. Patient not taking: Reported on 07/06/2019 11/01/18   Hoy Register, MD  sacubitril-valsartan (ENTRESTO) 49-51 MG Take 2 tablets by mouth 2 (two) times daily.    [provider]  tiZANidine (ZANAFLEX) 4 MG tablet Take 1 tablet (4 mg total) by mouth every 6 (six) hours as needed for muscle spasms. MUST MAKE APPT FOR FURTHER REFILLS 02/22/19   Hoy Register, MD    Physical Exam: Vitals:   11/16/22 0645 11/16/22 0700 11/16/22 0708 11/16/22 0815  BP: 126/82  109/83  92/71  Pulse:  (!) 39  (!) 57  Resp: 16 16  19   Temp:   98 F (36.7 C) 98 F (36.7 C)  TempSrc:   Oral Oral  SpO2: 100% 98%  95%  Weight:      Height:       Exam  Constitutional: Elderly male who appears to be in no acute distress Eyes: PERRL, lids and conjunctivae normal ENMT: Mucous membranes are moist.  Fair dentition with missing upper teeth. Neck: normal, supple,   Respiratory: clear to auscultation bilaterally, no wheezing, no crackles. Normal respiratory effort. No accessory muscle use.  Cardiovascular: Regular rate and rhythm, no murmurs / rubs / gallops.  At least 2+ pitting bilateral lower extremity edema. Abdomen: no tenderness, no masses palpated.   Bowel sounds positive.  Musculoskeletal: no clubbing / cyanosis. No joint deformity upper and lower extremities. Good ROM, no contractures. Normal muscle tone.  Skin: no rashes, lesions, ulcers. No induration Neurologic: CN 2-12 grossly intact. Sensation intact, DTR normal. Strength 5/5 in all 4.  Psychiatric: Normal judgment  and insight. Alert and oriented x 3. Normal mood.   Data Reviewed:  reviewed labs, imaging, and pertinent records as noted above in HPI  Assessment and Plan:  Acute kidney injury superimposed on chronic kidney disease chronic kidney disease stage IIIb Patient presents with creatinine elevated up to 3.07 with BUN 55.  Baseline creatinine previously noted to be 1.79-2.3 earlier this year.  Urinalysis did not note any acute abnormality.  Exact cause of patient's symptoms is not totally clear. -Admit to medical telemetry bed -Strict I&O's and daily weights -Avoid nephrotoxic agents -Check urine creatinine and urine urea. -Continue to monitor kidney function daily  Acute metabolic encephalopathy Patient was noted to be acutely lethargic and difficult to arouse at times.  Patient with prior history of polysubstance abuse admitted  to using cocaine 2 days ago.  CT scan of the head noted mild atrophic changes without acute abnormality. -Neurochecks -Follow-up UDS(positive for cocaine) -Check TSH and ammonia level -Check MRI of the brain  Strokelike symptoms On physical exam patient with slurred speech and concern for right-sided facial droop.  No prior reported history of stroke.  Symptoms may be secondary to patient missing his upper teeth. -Follow-up MRI of the brain -PT/OT to eval and treat  Elevated troponin Acute.  Patient reports intermittently having complaints of mostly right-sided chest pain.  High-sensitivity troponin 39.  -Check echocardiogram.  Bradycardia Acute. Patient's initial heart rates were noted to be as low as 39 -Held home medication regimen of Coreg  Systolic congestive heart failure Acute on chronic.  Patient with at least 2+ pitting edema lower extremity.  Chest x-ray had noted cardiomegaly without signs of pulmonary edema.  Last echocardiogram noted EF to be 35-40% with normal diastolic parameters. -Strict I&Os -Daily weights -Check BNP(4270.9) -Lasix 60 mg  IV BID -Check echocardiogram -Consider need to formally consult cardiology based off echo  Essential hypertension On admission initial blood pressures noted to be soft 84/63. -Hold Entresto due to AKI  -Hold Coreg due to bradycardia  Cocaine abuse Patient admitted to recently using cocaine. -Continue to counsel on need of cessation drug use  DVT prophylaxis: Heparin Advance Care Planning:   Code Status: Full Code  Consults: none  Family Communication: Patient's parent was updated over the phone.  Severity of Illness: The appropriate patient status for this patient is INPATIENT. Inpatient status is judged to be reasonable and necessary in order to provide  the required intensity of service to ensure the patient's safety. The patient's presenting symptoms, physical exam findings, and initial radiographic and laboratory data in the context of their chronic comorbidities is felt to place them at high risk for further clinical deterioration. Furthermore, it is not anticipated that the patient will be medically stable for discharge from the hospital within 2 midnights of admission.   * I certify that at the point of admission it is my clinical judgment that the patient will require inpatient hospital care spanning beyond 2 midnights from the point of admission due to high intensity of service, high risk for further deterioration and high frequency of surveillance required.*  Author: Clydie Braun, MD 11/16/2022 8:54 AM  For on call review [email or web address].

## 2022-11-17 ENCOUNTER — Inpatient Hospital Stay (HOSPITAL_COMMUNITY): Payer: 59

## 2022-11-17 ENCOUNTER — Other Ambulatory Visit (HOSPITAL_COMMUNITY): Payer: Self-pay

## 2022-11-17 DIAGNOSIS — R7989 Other specified abnormal findings of blood chemistry: Secondary | ICD-10-CM

## 2022-11-17 DIAGNOSIS — N189 Chronic kidney disease, unspecified: Secondary | ICD-10-CM | POA: Diagnosis not present

## 2022-11-17 DIAGNOSIS — N179 Acute kidney failure, unspecified: Secondary | ICD-10-CM | POA: Diagnosis not present

## 2022-11-17 DIAGNOSIS — R079 Chest pain, unspecified: Secondary | ICD-10-CM | POA: Diagnosis not present

## 2022-11-17 LAB — BASIC METABOLIC PANEL
Anion gap: 10 (ref 5–15)
BUN: 50 mg/dL — ABNORMAL HIGH (ref 8–23)
CO2: 19 mmol/L — ABNORMAL LOW (ref 22–32)
Calcium: 8.9 mg/dL (ref 8.9–10.3)
Chloride: 107 mmol/L (ref 98–111)
Creatinine, Ser: 2.53 mg/dL — ABNORMAL HIGH (ref 0.61–1.24)
GFR, Estimated: 27 mL/min — ABNORMAL LOW (ref >60)
Glucose, Bld: 86 mg/dL (ref 70–99)
Potassium: 4.6 mmol/L (ref 3.5–5.1)
Sodium: 136 mmol/L (ref 135–145)

## 2022-11-17 LAB — ECHOCARDIOGRAM COMPLETE
Calc EF: 23.2 %
Height: 68 in
Single Plane A2C EF: 27.9 %
Single Plane A4C EF: 23.4 %
Weight: 3040.58 oz

## 2022-11-17 LAB — CULTURE, BLOOD (ROUTINE X 2)
Special Requests: ADEQUATE
Special Requests: ADEQUATE

## 2022-11-17 LAB — CBC
HCT: 43.5 % (ref 39.0–52.0)
Hemoglobin: 14.4 g/dL (ref 13.0–17.0)
MCH: 34.4 pg — ABNORMAL HIGH (ref 26.0–34.0)
MCHC: 33.1 g/dL (ref 30.0–36.0)
MCV: 103.8 fL — ABNORMAL HIGH (ref 80.0–100.0)
Platelets: 118 10*3/uL — ABNORMAL LOW (ref 150–400)
RBC: 4.19 MIL/uL — ABNORMAL LOW (ref 4.22–5.81)
RDW: 14.1 % (ref 11.5–15.5)
WBC: 5.5 10*3/uL (ref 4.0–10.5)
nRBC: 0.4 % — ABNORMAL HIGH (ref 0.0–0.2)

## 2022-11-17 MED ORDER — PERFLUTREN LIPID MICROSPHERE
1.0000 mL | INTRAVENOUS | Status: AC | PRN
  Administered 2022-11-17: 4 mL via INTRAVENOUS

## 2022-11-17 MED ORDER — DIPHENHYDRAMINE HCL 25 MG PO CAPS
25.0000 mg | ORAL_CAPSULE | Freq: Once | ORAL | Status: AC | PRN
  Administered 2022-11-17: 25 mg via ORAL
  Filled 2022-11-17: qty 1

## 2022-11-17 MED ORDER — DIPHENHYDRAMINE HCL 50 MG/ML IJ SOLN
12.5000 mg | Freq: Three times a day (TID) | INTRAMUSCULAR | Status: DC | PRN
  Administered 2022-11-17 – 2022-11-18 (×2): 12.5 mg via INTRAVENOUS
  Filled 2022-11-17 (×2): qty 1

## 2022-11-17 NOTE — Progress Notes (Signed)
   Heart Failure Stewardship Pharmacist Progress Note   PCP: Raymon Mutton., FNP PCP-Cardiologist: None    HPI:  67 YO male with a PMH of HTN, HLD, systolic CHF, ischemic cardiomyopathy, CAD, CKD stage IIIb, polysubstance abuse   Patient presenting to ED 4/30 with weakness and shortness of breath. Chest x-ray noted cardiomegaly without signs of pulmonary edema. BNP 4270 with 2+ pitting edema of lower extremity noted on admission. Last echo done in 08/2018 with EF noted to be 35-40%. Repeat echo to be done today, 5/2. The patient reports compliance with his HF medications, but denies compliance with various pain medications (tizanidine, gabapentin).    Current HF Medications:  Diuretic: furosemide 60 mg IV BID  Prior to admission HF Medications: Diuretic: furosemide 60 mg BID Beta blocker: carvedilol 12.5 mg BID ACE/ARB/ARNI: Entresto 49/51 mg BID  Pertinent Lab Values: Serum creatinine 2.53, BUN 50, Potassium 4.6, Sodium 136, BNP 4270    Vital Signs: Weight: 190 lbs (admission weight: 190 lbs) Blood pressure: 110s/80s  Heart rate: 50s-80s  I/O: - yesterday; net -1.2L  Medication Assistance / Insurance Benefits Check: Does the patient have prescription insurance?  Yes Type of insurance plan: Medicare, Medicaid   Does the patient qualify for medication assistance through manufacturers or grants?   No Eligible grants and/or patient assistance programs: None Medication assistance applications in progress: None  Medication assistance applications approved: None  Outpatient Pharmacy:  Prior to admission outpatient pharmacy: [facility] Is the patient willing to use [facility] TOC pharmacy at discharge? Pending Is the patient willing to transition their outpatient pharmacy to utilize a [facility] outpatient pharmacy?   Pending    Assessment: 1. Acute on chronic systolic CHF (LVEF 35-40% from 08/2018). NYHA class III symptoms. - Scr continuing to improve 3.07>>2.53 from  admission while on IV furosemide 60 mg BID. Strict I's and O's. Daily weights.  - Recommend continuing IV furosemide given positive urine output and continued improvement in Scr  - If HR continues to improve, consider adding PTA carvedilol tomorrow as patient reports taking this medication consistently outpatient  - Would continue holding Entresto given the patient's soft-normal BP and re-evaluate tomorrow  - Could consider ARB + spironolactone if cannot tolerate Entresto with BP    Plan: 1) Medication changes recommended at this time: - No changes at this time   2) Patient assistance: Sherryll Burger, [place], and Farxiga copays are all $0   3)  Education  - Patient has been educated on current HF medications and potential additions to HF medication regimen - Patient verbalizes understanding that over the next few months, these medication doses may change and more medications may be added to optimize HF regimen - Patient has been educated on basic disease state pathophysiology and goals of therapy   Cherylin Mylar, PharmD PGY1 Pharmacy Resident 5/2/202410:57 AM

## 2022-11-17 NOTE — Progress Notes (Signed)
Echocardiogram 2D Echocardiogram has been performed.  [name] 11/17/2022, 3:48 PM

## 2022-11-17 NOTE — Progress Notes (Signed)
PROGRESS NOTE    [name]  NFA:[id number] DOB: 02/22/1956 DOA: 11/15/2022 PCP: Raymon Mutton., FNP   Brief Narrative:  HPI: [name] is a 67 y.o. male with medical history significant of hypertension, hyperlipidemia, systolic CHF, ischemic cardiomyopathy, CAD,  CKD stage IIIb, polysubstance abuse who presents after falling out of the bed last night.  He recalls is waking up on the floor.  He thinks that he hit his head as he had some head and neck soreness after waking up.  Over the last week patient had reported complaints of weakness, shortness of breath, and slurred speech.  He had also reported having some intermittent right-sided chest pain, and lower extremity swelling which is new.  He reports that normally his speech is not as slurred and his friend had told him that his face was drooping on the right-hand side.  He reports that he ambulates with use of a cane.  He did admit to using cocaine 2 days ago.  Patient also reports that sometimes he gets confused with what medicines he is supposed to take   In the emergency department patient was noted to be afebrile with pulse 43-66, respirations 15-29, and all other vital signs relatively maintained.  Labs noted elevated MCV and MCH, platelets 110, CO2 18, BUN 55, creatinine 3.07, glucose 154, anion gap 10, alcohol level undetectable, and high-sensitivity troponin 36.  Chest x-ray noted cardiomegaly without any acute abnormality.  Urinalysis showed no acute abnormality.  CT scan of the head noted mild atrophic changes without acute abnormality.  Patient has been bolused 500 mL of normal saline IV fluids.  Assessment & Plan:   Principal Problem:   Acute kidney injury superimposed on chronic kidney disease (HCC) Active Problems:   Acute metabolic encephalopathy   Stroke-like symptoms   Bradycardia   Acute on chronic systolic heart failure (HCC)   Benign essential HTN   Cocaine abuse (HCC)   Acute kidney injury superimposed  on chronic kidney disease chronic kidney disease stage IIIb with mild metabolic acidosis. Patient presents with creatinine elevated up to 3.07 with BUN 55.  Baseline creatinine previously noted to be 1.79-2.3 earlier this year.  Urinalysis did not note any acute abnormality.  Avoid nephrotoxic agents.  He appears fluid overloaded so he is going to need Lasix.  He has been on Lasix and despite of that his creatinine improved a little and is 2.53 today.   Acute metabolic encephalopathy/strokelike symptoms Patient was noted to be acutely lethargic and difficult to arouse at times.  He also had some slurred speech.  Patient with prior history of polysubstance abuse admitted  to using cocaine 2 days ago.  CT scan of the head noted mild atrophic changes without acute abnormality.  He is now fully alert and oriented.  He tells me that the night that he fell, he took tizanidine as well as Benadryl both.  That can explain the change in mental status.  MRI brain negative for stroke.   Elevated troponin Only slightly elevated and flat and interestingly he has chronically elevated troponins and current elevation is at baseline.  Do not suspect ACS.   Bradycardia Acute. Patient's initial heart rates were noted to be as low as 39 -Held home medication regimen of Coreg.  Heart rate in.  He is asymptomatic.   Acute on chronic systolic congestive heart failure Acute on chronic.  Patient with at least 2+ pitting edema lower extremity.  Chest x-ray had noted cardiomegaly without signs of pulmonary  edema.  Last echocardiogram done in February 2020 noted EF to be 35-40% with normal diastolic parameters. -Strict I&Os, daily weight, continue Lasix.  Echo pending.   Essential hypertension On admission initial blood pressures noted to be soft 84/63.  Blood pressure improving.  Continue to hold Entresto due to AKI and Coreg due to bradycardia.  Monitor closely.   Cocaine abuse Patient admitted to recently using cocaine.   He tells me that he uses once to 2 times a month.  Counseled regarding cessation.  He understands that.  DVT prophylaxis: heparin injection 5,000 Units Start: 11/16/22 0915   Code Status: Full Code  Family Communication:  None present at bedside.  Plan of care discussed with patient in length and he/she verbalized understanding and agreed with it.  Status is: Inpatient Remains inpatient appropriate because: Creatinine elevated, needs more IV diuresis.   Estimated body mass index is 28.89 kg/m as calculated from the following:   Height as of this encounter: 5\' 8"  (1.727 m).   Weight as of this encounter: 86.2 kg.    Nutritional Assessment: Body mass index is 28.89 kg/m.Marland Kitchen Seen by dietician.  I agree with the assessment and plan as outlined below: Nutrition Status:        . Skin Assessment: I have examined the patient's skin and I agree with the wound assessment as performed by the wound care RN as outlined below:    Consultants:  None  Procedures:  None  Antimicrobials:  Anti-infectives (From admission, onward)    None         Subjective: Seen and examined.  Feels much better.  Only complains of itching.  He is fully alert and oriented.  He tells me that he has been having this exertional shortness of breath for about 2 to 3 years and his bilateral lower extremity edema is almost 69 years old as well.  Objective: Vitals:   11/16/22 1722 11/16/22 2024 11/17/22 0438 11/17/22 0500  BP: 103/81 (!) 118/103 129/82   Pulse: 75 (!) 105 82   Resp: 17 18 18    Temp: 98.3 F (36.8 C) 99 F (37.2 C) (!) 97.4 F (36.3 C)   TempSrc:  Oral Oral   SpO2: 100% 100% 100%   Weight:    86.2 kg  Height:        Intake/Output Summary (Last 24 hours) at 11/17/2022 0943 Last data filed at 11/17/2022 0500 Gross per 24 hour  Intake 0 ml  Output 1000 ml  Net -1000 ml   Filed Weights   11/16/22 0246 11/17/22 0500  Weight: 86.2 kg 86.2 kg    Examination:  General exam: Appears  calm and comfortable  Respiratory system: Clear to auscultation. Respiratory effort normal. Cardiovascular system: S1 & S2 heard, RRR. No JVD, murmurs, rubs, gallops or clicks.  +2 pitting edema bilateral lower extremity. Gastrointestinal system: Abdomen is nondistended, soft and nontender. No organomegaly or masses felt. Normal bowel sounds heard. Central nervous system: Alert and oriented. No focal neurological deficits. Extremities: Symmetric 5 x 5 power. Skin: No rashes, lesions or ulcers Psychiatry: Judgement and insight appear normal. Mood & affect appropriate.    Data Reviewed: I have personally reviewed following labs and imaging studies  CBC: Recent Labs  Lab 11/15/22 2358 11/17/22 0412  WBC 4.9 5.5  HGB 14.6 14.4  HCT 45.0 43.5  MCV 107.1* 103.8*  PLT 110* 118*   Basic Metabolic Panel: Recent Labs  Lab 11/15/22 2358 11/16/22 1016 11/17/22 0412  NA 137 139  136  K 5.0 5.4* 4.6  CL 109 113* 107  CO2 18* 20* 19*  GLUCOSE 154* 125* 86  BUN 55* 48* 50*  CREATININE 3.07* 2.86* 2.53*  CALCIUM 9.0 9.1 8.9   GFR: Estimated Creatinine Clearance: 29.8 mL/min (A) (by C-G formula based on SCr of 2.53 mg/dL (H)). Liver Function Tests: Recent Labs  Lab 11/16/22 1016  AST 19  ALT 25  ALKPHOS 98  BILITOT 1.2  PROT 5.8*  ALBUMIN 3.5   No results for input(s): "LIPASE", "AMYLASE" in the last 168 hours. Recent Labs  Lab 11/16/22 1016  AMMONIA 29   Coagulation Profile: Recent Labs  Lab 11/16/22 0137  INR 1.4*   Cardiac Enzymes: No results for input(s): "CKTOTAL", "CKMB", "CKMBINDEX", "TROPONINI" in the last 168 hours. BNP (last 3 results) No results for input(s): "PROBNP" in the last 8760 hours. HbA1C: No results for input(s): "HGBA1C" in the last 72 hours. CBG: Recent Labs  Lab 11/16/22 0005 11/16/22 0034  GLUCAP 163* 140*   Lipid Profile: No results for input(s): "CHOL", "HDL", "LDLCALC", "TRIG", "CHOLHDL", "LDLDIRECT" in the last 72 hours. Thyroid  Function Tests: Recent Labs    11/16/22 1016  TSH 4.717*   Anemia Panel: No results for input(s): "VITAMINB12", "FOLATE", "FERRITIN", "TIBC", "IRON", "RETICCTPCT" in the last 72 hours. Sepsis Labs: Recent Labs  Lab 11/16/22 0137  LATICACIDVEN 1.9    No results found for this or any previous visit (from the past 240 hour(s)).   Radiology Studies: MR BRAIN WO CONTRAST  Result Date: 11/16/2022 CLINICAL DATA:  Neuro deficit, acute, stroke suspected. Mental status change, unknown cause. EXAM: MRI HEAD WITHOUT CONTRAST TECHNIQUE: Multiplanar, multiecho pulse sequences of the brain and surrounding structures were obtained without intravenous contrast. COMPARISON:  Head CT 11/16/2022. FINDINGS: Brain: Motion degraded study. No acute infarct or hemorrhage. Mild chronic small-vessel disease. No hydrocephalus or extra-axial collection. No mass or midline shift. No foci of abnormal susceptibility. Vascular: Normal flow voids. Skull and upper cervical spine: Normal marrow signal. Sinuses/Orbits: Unremarkable. Other: None. IMPRESSION: No acute intracranial process. Electronically Signed   By: Orvan Falconer M.D.   On: 11/16/2022 14:19   CT Head Wo Contrast  Result Date: 11/16/2022 CLINICAL DATA:  Recent fall with headaches, initial encounter EXAM: CT HEAD WITHOUT CONTRAST TECHNIQUE: Contiguous axial images were obtained from the base of the skull through the vertex without intravenous contrast. RADIATION DOSE REDUCTION: This exam was performed according to the departmental dose-optimization program which includes automated exposure control, adjustment of the mA and/or kV according to patient size and/or use of iterative reconstruction technique. COMPARISON:  None Available. FINDINGS: Brain: No evidence of acute infarction, hemorrhage, hydrocephalus, extra-axial collection or mass lesion/mass effect. Mild atrophic changes are noted. Vascular: No hyperdense vessel or unexpected calcification. Skull: Normal.  Negative for fracture or focal lesion. Sinuses/Orbits: No acute finding. Other: None IMPRESSION: Mild atrophic changes without acute abnormality. Electronically Signed   By: Alcide Clever M.D.   On: 11/16/2022 02:35   DG CHEST PORT 1 VIEW  Result Date: 11/16/2022 CLINICAL DATA:  Shortness of breath. EXAM: PORTABLE CHEST 1 VIEW COMPARISON:  October 06, 2022 FINDINGS: The cardiac silhouette is moderately enlarged and mildly increased in size when compared to the prior study. This may be, in part, technical in origin. Mild to moderate severity calcification of the aortic arch is seen. Low lung volumes are noted. Both lungs are clear. The visualized skeletal structures are unremarkable. IMPRESSION: Cardiomegaly and low lung volumes without evidence of acute or  active cardiopulmonary disease. Electronically Signed   By: Aram Candela M.D.   On: 11/16/2022 01:00    Scheduled Meds:  aspirin EC  81 mg Oral Daily   furosemide  60 mg Intravenous BID   heparin  5,000 Units Subcutaneous Q8H   sodium chloride flush  3 mL Intravenous Q12H   Continuous Infusions:   LOS: 1 day   Hughie Closs, MD [facility]  11/17/2022, 9:43 AM   *Please note that this is a verbal dictation therefore any spelling or grammatical errors are due to the "Dragon Medical One" system interpretation.  Please page via Amion and do not message via secure chat for urgent patient care matters. Secure chat can be used for non urgent patient care matters.  How to contact the [facility] Attending or Consulting provider 7A - 7P or covering provider during after hours 7P -7A, for this patient?  Check the care team in [facility] and look for a) attending/consulting TRH provider listed and b) the [facility] team listed. Page or secure chat 7A-7P. Log into [email or web address] and use [facility] universal password to access. If you do not have the password, please contact the hospital operator. Locate the [facility] provider you are looking for under [facility] and page to a number that you can be directly reached. If you still have difficulty reaching the provider, please page the [facility] (Director on Call) for the Hospitalists listed on amion for assistance.

## 2022-11-17 NOTE — TOC Initial Note (Signed)
Transition of Care [facility]) - Initial/Assessment Note    Patient Details  Name: [name] MRN: [id number] Date of Birth: [birth year]  [facility] CM/SW Contact:    Tom-Pennick, Hershal Coria, RN Phone Number: 11/17/2022, 2:39 PM  Clinical Narrative:                  CM spoke with patient at bedside about needs for post hospital transition. Patient states he fell out of bed night prior to admission and found to have AKI on CKD. Stroke assessment was done.  Patient is from home alone, does not have children. Has three siblings that are supportive. Does not drive, states he has a friend that transports him to and from his appointments.  Has a cane that belongs to his sister and grab bars at home.  PT recommended a cane, order called in to [employer] and Barbara Cower to deliver to patient at bedside.  PCP is Raymon Mutton., FNP and uses [facility] pharmacy on [address]. No PT f/u noted. CM will continue to follow as patient progresses with care towards discharge.           Expected Discharge Plan: Home/Self Care Barriers to Discharge: Continued Medical Work up   Patient Goals and CMS Choice Patient states their goals for this hospitalization and ongoing recovery are:: To return home CMS Medicare.gov Compare Post Acute Care list provided to:: Patient Choice offered to / list presented to : Patient      Expected Discharge Plan and Services   Discharge Planning Services: CM Consult Post Acute Care Choice: NA Living arrangements for the past 2 months: Apartment                 DME Arranged: Gilmer Mor DME Agency: [employer] Date DME Agency Contacted: 11/17/22 Time DME Agency Contacted: [id number] Representative spoke with at DME Agency: Barbara Cower HH Arranged: NA HH Agency: NA        Prior Living Arrangements/Services Living arrangements for the past 2 months: Apartment Lives with:: Self Patient language and need for interpreter reviewed:: Yes Do you feel safe going back to the  place where you live?: Yes      Need for Family Participation in Patient Care: Yes (Comment) Care giver support system in place?: Yes (comment) Current home services: DME (Cane from family and grab bars.) Criminal Activity/Legal Involvement Pertinent to Current Situation/Hospitalization: No - Comment as needed  Activities of Daily Living Home Assistive Devices/Equipment: Cane (specify quad or straight) ADL Screening (condition at time of admission) Patient's cognitive ability adequate to safely complete daily activities?: Yes Is the patient deaf or have difficulty hearing?: No Does the patient have difficulty seeing, even when wearing glasses/contacts?: No Does the patient have difficulty concentrating, remembering, or making decisions?: No Patient able to express need for assistance with ADLs?: No Does the patient have difficulty dressing or bathing?: No Independently performs ADLs?: Yes (appropriate for developmental age) Does the patient have difficulty walking or climbing stairs?: Yes Weakness of Legs: Both Weakness of Arms/Hands: None  Permission Sought/Granted Permission sought to share information with : Case Manager, Magazine features editor, Other (comment)                Emotional Assessment Appearance:: Appears stated age Attitude/Demeanor/Rapport: Engaged, Gracious Affect (typically observed): Accepting, Appropriate, Calm, Hopeful, Pleasant Orientation: : Oriented to Self, Oriented to Place, Oriented to  Time, Oriented to Situation Alcohol / Substance Use: Illicit Drugs (Cocaine use) Psych Involvement: No (comment)  Admission diagnosis:  Dehydration [E86.0] Weakness [R53.1] AKI (acute kidney injury) (HCC) [N17.9] Acute kidney injury superimposed on chronic kidney disease (HCC) [N17.9, N18.9] Patient Active Problem List   Diagnosis Date Noted   Acute kidney injury superimposed on chronic kidney disease (HCC) 11/16/2022   Acute metabolic encephalopathy  11/16/2022   Stroke-like symptoms 11/16/2022   Bradycardia 11/16/2022   Cocaine abuse (HCC) 11/16/2022   Non-ischemic cardiomyopathy (HCC) 11/21/2018   Syncope and collapse 09/06/2018   Neuropathy 03/03/2017   Lower urinary tract symptoms 01/06/2017   Acute on chronic systolic heart failure (HCC) 07/05/2016   Macrocytosis without anemia 07/05/2016   Hypotension 06/14/2016   Generalized anxiety disorder 06/13/2016   Dermatitis 01/27/2016   Troponin level elevated    CKD (chronic kidney disease) stage 3, GFR 30-59 ml/min (HCC) 05/03/2015   Chronic combined systolic and diastolic congestive heart failure (HCC) 05/03/2015   Benign essential HTN 05/03/2015   Polysubstance abuse (HCC)    Noncompliance    Gout 03/24/2015   PCP:  Raymon Mutton., FNP Pharmacy:   [facility]- [place], [place] - [address] [place] [address] Phone: [phone] Fax: [phone]  [facility] - [place], [place] - [address] [place] [address] Phone: [phone] Fax: [phone]  [facility] [address] [place] [address] Phone: [phone] Fax: [phone]     Social Determinants of Health (SDOH) Social History: SDOH Screenings   Food Insecurity: No Food Insecurity (11/16/2022)  Housing: Low Risk  (11/16/2022)  Transportation Needs: No Transportation Needs (11/16/2022)  Utilities: Not At Risk (11/16/2022)  Depression (PHQ2-9): Low Risk  (09/20/2018)  Tobacco Use: Medium Risk (11/16/2022)   SDOH Interventions: Transportation Interventions: Intervention Not Indicated, Inpatient TOC, Patient Resources (Friends/Family)   Readmission Risk Interventions    11/17/2022    2:32 PM  Readmission Risk Prevention Plan  Transportation Screening Complete  PCP or Specialist Appt within 3-5 Days Complete  HRI or Home Care Consult Complete  Social Work Consult for Recovery  Care Planning/Counseling Complete  Palliative Care Screening Not Applicable  Medication Review Oceanographer) Referral to Pharmacy

## 2022-11-17 NOTE — Progress Notes (Signed)
OT Cancellation Note  Patient Details Name: [name] MRN: [id number] DOB: Jan 11, 1956   Cancelled Treatment:    Reason Eval/Treat Not Completed: Patient at procedure or test/ unavailable (Pt receiving echocardiogram upon OT arrival. OT will attempt to see pt for skilled OT evaluation later today as time allows.)  [name] "Ronaldo Miyamoto" M., OTR/L, MA Acute Rehab [phone]   [name] 11/17/2022, 4:40 PM

## 2022-11-17 NOTE — Evaluation (Signed)
Physical Therapy Evaluation  Patient Details Name: [name] MRN: [id number] DOB: 07-27-1955 Today's Date: 11/17/2022  History of Present Illness  Pt is a 67 y/o male who presents s/p fall OOB at home. He was admitted 11/15/22 with AKI superimposed on CKD. PMH significant for CKD IV, HTN, NSTEMI, polysubstance abuse.   Clinical Impression  Pt admitted with above diagnosis. Pt currently with functional limitations due to the deficits listed below (see PT Problem List). At the time of PT eval pt was able to perform transfers with modified independence and ambulation with gross min guard assist and SPC for support. Pt on RA throughout session and SpO2 >95% throughout functional mobility and at 98% at rest at end of session. Pt reports improvement in breathing compared to PTA. Of note, pt reports several falls at home however states it is usually when he's "had a beer or two". Pt is likely near baseline of function. Acutely, pt will benefit from acute skilled PT to increase their independence and safety with mobility to allow discharge.          Recommendations for follow up therapy are one component of a multi-disciplinary discharge planning process, led by the attending physician.  Recommendations may be updated based on patient status, additional functional criteria and insurance authorization.  Follow Up Recommendations       Assistance Recommended at Discharge PRN  Patient can return home with the following  A little help with walking and/or transfers;A little help with bathing/dressing/bathroom;Assistance with cooking/housework;Help with stairs or ramp for entrance;Assist for transportation    Equipment Recommendations Cane (Single point)  Recommendations for Other Services       Functional Status Assessment Patient has had a recent decline in their functional status and demonstrates the ability to make significant improvements in function in a reasonable and predictable amount of  time.     Precautions / Restrictions Precautions Precautions: Fall Restrictions Weight Bearing Restrictions: No      Mobility  Bed Mobility Overal bed mobility: Modified Independent             General bed mobility comments: No assist required to transition to EOB. Increased time but HOB slightly elevated and no use of rails required.    Transfers Overall transfer level: Needs assistance Equipment used: Straight cane Transfers: Sit to/from Stand Sit to Stand: Supervision           General transfer comment: Pt demonstrated proper hand placement on seated surface for safety. No assist required.    Ambulation/Gait Ambulation/Gait assistance: Min guard Gait Distance (Feet): 400 Feet Assistive device: Straight cane Gait Pattern/deviations: Step-through pattern, Decreased stride length, Trunk flexed Gait velocity: Decreased Gait velocity interpretation: 1.31 - 2.62 ft/sec, indicative of limited community ambulator   General Gait Details: VC's throughout for improved posture. Pt on RA and sats >95% throughout gait training. No overt LOB noted. Overall good sequencing with the SPC.  Stairs            Wheelchair Mobility    Modified Rankin (Stroke Patients Only)       Balance Overall balance assessment: Needs assistance Sitting-balance support: Feet supported, No upper extremity supported Sitting balance-Leahy Scale: Good     Standing balance support: Single extremity supported, During functional activity, Reliant on assistive device for balance Standing balance-Leahy Scale: Fair                               Pertinent Vitals/Pain  Pain Assessment Pain Assessment: No/denies pain    Home Living Family/patient expects to be discharged to:: Private residence Living Arrangements: Alone Available Help at Discharge: Personal care attendant ("about an hour every day" per pt report. Unsure if this is a friend or an Government social research officer.) Type of Home:  Apartment Home Access: Level entry       Home Layout: One level Home Equipment: Cane - single point (borrowed)      Prior Function Prior Level of Function : Independent/Modified Independent             Mobility Comments: Using SPC that is borrowed       Hand Dominance   Dominant Hand: Right    Extremity/Trunk Assessment   Upper Extremity Assessment Upper Extremity Assessment: Defer to OT evaluation    Lower Extremity Assessment Lower Extremity Assessment: LLE deficits/detail;RLE deficits/detail RLE Deficits / Details: MMT 4+/5 strength in quads, hamstrings, ankle DF; 4/5 strength in hip flexors. LLE Deficits / Details: 4-/5 strength in quads, hamstrings, hip flexors; 4+/5 strength in ankle DF    Cervical / Trunk Assessment Cervical / Trunk Assessment: Other exceptions Cervical / Trunk Exceptions: Forward head posture with rounded shoulders  Communication   Communication: No difficulties  Cognition Arousal/Alertness: Awake/alert Behavior During Therapy: WFL for tasks assessed/performed Overall Cognitive Status: Within Functional Limits for tasks assessed                                          General Comments      Exercises     Assessment/Plan    PT Assessment Patient needs continued PT services  PT Problem List Decreased strength;Decreased activity tolerance;Decreased balance;Decreased mobility;Decreased knowledge of use of DME;Decreased safety awareness;Decreased knowledge of precautions;Cardiopulmonary status limiting activity       PT Treatment Interventions DME instruction;Gait training;Functional mobility training;Therapeutic activities;Therapeutic exercise;Balance training;Patient/family education    PT Goals (Current goals can be found in the Care Plan section)  Acute Rehab PT Goals Patient Stated Goal: Return to his apartment; improve breathing PT Goal Formulation: With patient Time For Goal Achievement: 11/24/22 Potential  to Achieve Goals: Good    Frequency Min 2X/week     Co-evaluation               AM-PAC PT "6 Clicks" Mobility  Outcome Measure Help needed turning from your back to your side while in a flat bed without using bedrails?: None Help needed moving from lying on your back to sitting on the side of a flat bed without using bedrails?: None Help needed moving to and from a bed to a chair (including a wheelchair)?: A Little Help needed standing up from a chair using your arms (e.g., wheelchair or bedside chair)?: A Little Help needed to walk in hospital room?: A Little Help needed climbing 3-5 steps with a railing? : A Little 6 Click Score: 20    End of Session Equipment Utilized During Treatment: Gait belt Activity Tolerance: Patient tolerated treatment well Patient left: with call bell/phone within reach;in chair;with chair alarm set Nurse Communication: Mobility status PT Visit Diagnosis: Repeated falls (R29.6);Difficulty in walking, not elsewhere classified (R26.2)    Time: 1610-9604 PT Time Calculation (min) (ACUTE ONLY): 22 min   Charges:   PT Evaluation $PT Eval Low Complexity: 1 Low          Conni Slipper, PT, DPT Acute Rehabilitation Services Secure Chat Preferred  Office: [phone]   Marylynn Pearson 11/17/2022, 11:50 AM

## 2022-11-18 ENCOUNTER — Encounter (HOSPITAL_COMMUNITY): Payer: Self-pay | Admitting: Internal Medicine

## 2022-11-18 DIAGNOSIS — N179 Acute kidney failure, unspecified: Secondary | ICD-10-CM | POA: Diagnosis not present

## 2022-11-18 DIAGNOSIS — N189 Chronic kidney disease, unspecified: Secondary | ICD-10-CM | POA: Diagnosis not present

## 2022-11-18 LAB — CBC WITH DIFFERENTIAL/PLATELET
Abs Immature Granulocytes: 0.02 10*3/uL (ref 0.00–0.07)
Basophils Absolute: 0 10*3/uL (ref 0.0–0.1)
Basophils Relative: 1 %
Eosinophils Absolute: 0.4 10*3/uL (ref 0.0–0.5)
Eosinophils Relative: 7 %
HCT: 46 % (ref 39.0–52.0)
Hemoglobin: 14.7 g/dL (ref 13.0–17.0)
Immature Granulocytes: 0 %
Lymphocytes Relative: 21 %
Lymphs Abs: 1.3 10*3/uL (ref 0.7–4.0)
MCH: 33.9 pg (ref 26.0–34.0)
MCHC: 32 g/dL (ref 30.0–36.0)
MCV: 106 fL — ABNORMAL HIGH (ref 80.0–100.0)
Monocytes Absolute: 0.6 10*3/uL (ref 0.1–1.0)
Monocytes Relative: 10 %
Neutro Abs: 3.6 10*3/uL (ref 1.7–7.7)
Neutrophils Relative %: 61 %
Platelets: 122 10*3/uL — ABNORMAL LOW (ref 150–400)
RBC: 4.34 MIL/uL (ref 4.22–5.81)
RDW: 14 % (ref 11.5–15.5)
WBC: 5.8 10*3/uL (ref 4.0–10.5)
nRBC: 0 % (ref 0.0–0.2)

## 2022-11-18 LAB — BASIC METABOLIC PANEL
Anion gap: 9 (ref 5–15)
BUN: 47 mg/dL — ABNORMAL HIGH (ref 8–23)
CO2: 23 mmol/L (ref 22–32)
Calcium: 9.1 mg/dL (ref 8.9–10.3)
Chloride: 108 mmol/L (ref 98–111)
Creatinine, Ser: 2.39 mg/dL — ABNORMAL HIGH (ref 0.61–1.24)
GFR, Estimated: 29 mL/min — ABNORMAL LOW (ref >60)
Glucose, Bld: 87 mg/dL (ref 70–99)
Potassium: 4.6 mmol/L (ref 3.5–5.1)
Sodium: 140 mmol/L (ref 135–145)

## 2022-11-18 LAB — CULTURE, BLOOD (ROUTINE X 2): Culture: NO GROWTH

## 2022-11-18 MED ORDER — FAMOTIDINE 20 MG PO TABS
20.0000 mg | ORAL_TABLET | Freq: Two times a day (BID) | ORAL | Status: DC
  Administered 2022-11-18 – 2022-11-20 (×5): 20 mg via ORAL
  Filled 2022-11-18 (×5): qty 1

## 2022-11-18 MED ORDER — DIPHENHYDRAMINE HCL 12.5 MG/5ML PO ELIX: 12.5000 mg | ORAL_SOLUTION | Freq: Three times a day (TID) | ORAL | Status: DC | PRN

## 2022-11-18 MED ORDER — PANTOPRAZOLE SODIUM 40 MG PO TBEC
40.0000 mg | DELAYED_RELEASE_TABLET | Freq: Every day | ORAL | Status: DC
  Administered 2022-11-18 – 2022-11-20 (×3): 40 mg via ORAL
  Filled 2022-11-18 (×3): qty 1

## 2022-11-18 MED ORDER — GABAPENTIN 300 MG PO CAPS
300.0000 mg | ORAL_CAPSULE | Freq: Two times a day (BID) | ORAL | Status: DC
  Administered 2022-11-18 – 2022-11-20 (×5): 300 mg via ORAL
  Filled 2022-11-18 (×5): qty 1

## 2022-11-18 MED ORDER — MONTELUKAST SODIUM 10 MG PO TABS
10.0000 mg | ORAL_TABLET | Freq: Every day | ORAL | Status: DC
  Administered 2022-11-18 – 2022-11-19 (×2): 10 mg via ORAL
  Filled 2022-11-18 (×2): qty 1

## 2022-11-18 MED ORDER — TIZANIDINE HCL 4 MG PO TABS
4.0000 mg | ORAL_TABLET | Freq: Four times a day (QID) | ORAL | Status: DC | PRN
  Administered 2022-11-19: 4 mg via ORAL
  Filled 2022-11-18: qty 1

## 2022-11-18 MED ORDER — CARVEDILOL 6.25 MG PO TABS
6.2500 mg | ORAL_TABLET | Freq: Two times a day (BID) | ORAL | Status: DC
  Administered 2022-11-18 – 2022-11-20 (×5): 6.25 mg via ORAL
  Filled 2022-11-18 (×5): qty 1

## 2022-11-18 MED ORDER — TAMSULOSIN HCL 0.4 MG PO CAPS
0.4000 mg | ORAL_CAPSULE | Freq: Every day | ORAL | Status: DC
  Administered 2022-11-18 – 2022-11-20 (×3): 0.4 mg via ORAL
  Filled 2022-11-18 (×3): qty 1

## 2022-11-18 MED ORDER — DIPHENHYDRAMINE HCL 50 MG/ML IJ SOLN: 12.5000 mg | Freq: Three times a day (TID) | INTRAMUSCULAR | Status: DC | PRN

## 2022-11-18 MED ORDER — ATORVASTATIN CALCIUM 40 MG PO TABS
40.0000 mg | ORAL_TABLET | Freq: Every day | ORAL | Status: DC
  Administered 2022-11-18 – 2022-11-19 (×2): 40 mg via ORAL
  Filled 2022-11-18 (×2): qty 1

## 2022-11-18 MED ORDER — ALBUTEROL SULFATE (2.5 MG/3ML) 0.083% IN NEBU: 2.5000 mg | INHALATION_SOLUTION | RESPIRATORY_TRACT | Status: DC | PRN

## 2022-11-18 NOTE — Evaluation (Signed)
Occupational Therapy Evaluation Patient Details Name: [name] MRN: [id number] DOB: [birth year] Today's Date: 11/18/2022   History of Present Illness Pt is a 67 y/o male who presents s/p fall OOB at home. He was admitted 11/15/22 with AKI superimposed on CKD. PMH significant for CKD IV, HTN, NSTEMI, polysubstance abuse.   Clinical Impression   Pt supine in bed upon OT arrival. Pt was agreeable to skilled OT evaluation. At baseline, pt performs ADLs Independent to Mod I and functional transfers and functional mobility with SPC with Mod I. At baseline, pt receives daily assistance from a friend for assistance with meal prep and home management tasks. Pt currently demonstrates ability to complete self feeding Independent. Pt currently demonstrates ability to complete grooming in standing, UB/LB dressing in sit/stand, UB/LB bathing in sit/stand, and all steps of toileting task with Mod I, which is pt's baseline PLOF. Pt current requires Mod I to Supervision for functional transfers and functional mobility with SPC, which is near pt's baseline. Pt currently receiving acute skilled PT to address transfers and mobility. No additional acute skilled OT services are indicated at this time. No post acute skilled OT follow up is indicated at this time.      Recommendations for follow up therapy are one component of a multi-disciplinary discharge planning process, led by the attending physician.  Recommendations may be updated based on patient status, additional functional criteria and insurance authorization.   Assistance Recommended at Discharge PRN  Patient can return home with the following Assist for transportation;Direct supervision/assist for medications management;Help with stairs or ramp for entrance;Assistance with cooking/housework    Functional Status Assessment  Patient has not had a recent decline in their functional status (Pt had a recent decline at time of admission on 11/15/22 and appears  to now be back at baseline PLOF.)  Equipment Recommendations  None recommended by OT    Recommendations for Other Services       Precautions / Restrictions Precautions Precautions: Fall Restrictions Weight Bearing Restrictions: No      Mobility Bed Mobility Overal bed mobility: Modified Independent                  Transfers Overall transfer level: Needs assistance Equipment used: Straight cane Transfers: Sit to/from Stand, Bed to chair/wheelchair/BSC Sit to Stand: Modified independent (Device/Increase time)     Step pivot transfers: Supervision     General transfer comment: Mod I to Supervision for functional transfers and functional mobility with use of SPC in room.      Balance Overall balance assessment: Needs assistance Sitting-balance support: Feet supported, No upper extremity supported Sitting balance-Leahy Scale: Good     Standing balance support: Single extremity supported, No upper extremity supported, During functional activity Standing balance-Leahy Scale: Fair                             ADL either performed or assessed with clinical judgement   ADL Overall ADL's : Modified independent;At baseline                                       General ADL Comments: Requires extra time. Supervision encouraged during bathing for safety.     Vision Baseline Vision/History: 1 Wears glasses Ability to See in Adequate Light: 0 Adequate Patient Visual Report: No change from baseline Vision Assessment?: Yes Eye Alignment: Within Functional  Limits Ocular Range of Motion: Within Functional Limits Alignment/Gaze Preference: Within Defined Limits Tracking/Visual Pursuits: Able to track stimulus in all quads without difficulty Saccades: Within functional limits Convergence: Within functional limits Visual Fields: No apparent deficits Additional Comments: Pt vision appears WFL with glasses.     Perception  Perception Perception Tested?: Yes Comments: Perception appears WFL.   Praxis Praxis Praxis tested?: Within functional limits    Pertinent Vitals/Pain Pain Assessment Pain Assessment: No/denies pain     Hand Dominance Right   Extremity/Trunk Assessment Upper Extremity Assessment Upper Extremity Assessment: Overall WFL for tasks assessed   Lower Extremity Assessment Lower Extremity Assessment: Defer to PT evaluation   Cervical / Trunk Assessment Cervical / Trunk Assessment: Other exceptions (Forward head posture, rounded shoulders)   Communication Communication Communication: No difficulties   Cognition Arousal/Alertness: Awake/alert Behavior During Therapy: WFL for tasks assessed/performed Overall Cognitive Status: Within Functional Limits for tasks assessed                                 General Comments: Pt reports at time of hospital admission and for a few days before he felt mentally "foggy" and mildly confused. Pt reports now feeling "clear" and no signs of confusion noted this day.     General Comments  VSS on RA    Exercises     Shoulder Instructions      Home Living Family/patient expects to be discharged to:: Private residence Living Arrangements: Alone Available Help at Discharge: Friend(s) (Pt reports daily assistance from a close friend.) Type of Home: Apartment Home Access: Level entry     Home Layout: One level     Bathroom Shower/Tub: Chief Strategy Officer: Handicapped height     Home Equipment: Cane - single point          Prior Functioning/Environment Prior Level of Function : Independent/Modified Independent             Mobility Comments: At baseline, pt performs functional mobility Mod I with SPC. ADLs Comments: Pt previously Independent to Mod I with ADLs and receives daily assistance from friend for meal prep and assistance with home management tasks.        OT Problem List:        OT  Treatment/Interventions:      OT Goals(Current goals can be found in the care plan section) Acute Rehab OT Goals Patient Stated Goal: Pt wants to return home. OT Goal Formulation: All assessment and education complete, DC therapy  OT Frequency:      Co-evaluation              AM-PAC OT "6 Clicks" Daily Activity     Outcome Measure Help from another person eating meals?: None Help from another person taking care of personal grooming?: None Help from another person toileting, which includes using toliet, bedpan, or urinal?: None Help from another person bathing (including washing, rinsing, drying)?: None Help from another person to put on and taking off regular upper body clothing?: None Help from another person to put on and taking off regular lower body clothing?: None 6 Click Score: 24   End of Session Equipment Utilized During Treatment: Other (comment) [facility]) Nurse Communication: Mobility status  Activity Tolerance: Patient tolerated treatment well Patient left: in chair;with call bell/phone within reach;Other (comment) (MD in room)  Time: 5409-8119 OT Time Calculation (min): 16 min Charges:  OT General Charges $OT Visit: 1 Visit OT Evaluation $OT Eval Low Complexity: 1 Low  Sravya Grissom "Kyle" M., OTR/L, MA Acute Rehab [phone]   [name] 11/18/2022, 1:02 PM

## 2022-11-18 NOTE — Progress Notes (Signed)
Heart Failure Nurse Navigator Progress Note  PCP: Raymon Mutton., FNP PCP-Cardiologist: None Admission Diagnosis: Weakness, Dehydration, AKI Admitted from: Home via EMS  Presentation:   [name] presented with shortness of breath, weakness, BLE edema, reported fell out of bed, but doesn't remember. Sinus brady in the 40's. BNP 4,220, Creat 3.07, positive for cocaine on admission, CXR showed cardiomegaly without pulmonary edema, patient stated he usually takes all his medications most of the time. CT scan of the head noted mild atrophic changes without acute abnormality. IV diuresis started, Last EF 2020 was 35-40% , new EF worsened to 20-25%.   Patient was educated on the sign and symptoms of heart failure,daily weights, when to call his doctor or go to the ED, Navigator brought a scale to bedside for patient to use at home, Education on diet/ fluid restrictions, patient reported to the use of salt, and daily soda) taking all medication as prescribed and attending all medical appointments. Patient verbalized his understanding of education. A HF TOC appointment was scheduled for 12/07/2022 @ 11 am.   ECHO/ LVEF: 20-25%  Clinical Course:  Past Medical History:  Diagnosis Date   Anxiety    Arthritis    "all over" (01/07/2015)   CKD (chronic kidney disease), stage IV (HCC)    Depression    Headache    "probably weekly" (01/07/2015)   History of echocardiogram 12/2014   EF 20-25% -> improved up to EF 35 and 40% February 2020:   Homelessness    Hypercholesterolemia    Hypertension    Ischemic dilated cardiomyopathy (HCC)    Noncompliance    Nonischemic dilated cardiomyopathy (HCC) 07/2014   Initial EF was 2025% with global hypokinesis.  As of February 2020 EF up to 35-40%, GRII DD.  Global hypokinesis.  No WMA.  Relatively normal valves.   NSTEMI (non-ST elevated myocardial infarction) [facility]) 2016   2 separate episodes in January and June.  Normal coronary arteries by cardiac  catheterization.  Thought to be related to.   Polysubstance abuse (HCC)    etoh, cocaine   Urinary hesitancy    Walking pneumonia 07/2014     Social History   Socioeconomic History   Marital status: Single    Spouse name: Not on file   Number of children: Not on file   Years of education: Not on file   Highest education level: Not on file  Occupational History   Not on file  Tobacco Use   Smoking status: Former    Packs/day: 1.50    Years: 30.00    Additional pack years: 0.00    Total pack years: 45.00    Types: Cigarettes    Quit date: 02/19/2004    Years since quitting: 18.7   Smokeless tobacco: Former   Tobacco comments:    quit 2005  Substance and Sexual Activity   Alcohol use: Yes    Alcohol/week: 2.0 - 4.0 standard drinks of alcohol    Types: 2 - 4 Cans of beer per week    Comment: once weekly   Drug use: No    Comment: former   Sexual activity: Never    Comment: crack  Other Topics Concern   Not on file  Social History Narrative   Not on file   Social Determinants of Health   Financial Resource Strain: Not on file  Food Insecurity: No Food Insecurity (11/16/2022)   Hunger Vital Sign    Worried About Running Out of Food in the Last  Year: Never true    Ran Out of Food in the Last Year: Never true  Transportation Needs: No Transportation Needs (11/16/2022)   PRAPARE - Administrator, Civil Service (Medical): No    Lack of Transportation (Non-Medical): No  Physical Activity: Not on file  Stress: Not on file  Social Connections: Not on file   Education Assessment and Provision:  Detailed education and instructions provided on heart failure disease management including the following:  Signs and symptoms of Heart Failure When to call the physician Importance of daily weights Low sodium diet Fluid restriction Medication management Anticipated future follow-up appointments  Patient education given on each of the above topics.  Patient  acknowledges understanding via teach back method and acceptance of all instructions.  Education Materials:  "Living Better With Heart Failure" Booklet, HF zone tool, & Daily Weight Tracker Tool.  Patient has scale at home: No, Navigator brought one to bedside for home use.  Patient has pill box at home: NA    High Risk Criteria for Readmission and/or Poor Patient Outcomes: Heart failure hospital admissions (last 6 months): 0  No Show rate: 17% Difficult social situation: No, lives with family Demonstrates medication adherence: No Primary Language: English Literacy level: Reading, writing, and comprehension  Barriers of Care:   Medication compliance Diet/ fluid restrictions ( salt, soda) Daily weights  Considerations/Referrals:   Referral made to Heart Failure Pharmacist Stewardship: Yes Referral made to Heart Failure CSW/NCM TOC: No Referral made to Heart & Vascular TOC clinic: Yes, 12/07/2022 @ 11 am.   Items for Follow-up on DC/TOC: Medication compliance Diet/ fluids restrictions ( salt/ soda) Daily weights Continued HF education   Rhae Hammock, BSN, RN Heart Failure Print production planner Chat Only

## 2022-11-18 NOTE — Progress Notes (Signed)
PROGRESS NOTE    [name]  QIH:[id number] DOB: [birth year] DOA: 11/15/2022 PCP: Raymon Mutton., FNP   Brief Narrative:  HPI: [name] is a 67 y.o. male with medical history significant of hypertension, hyperlipidemia, systolic CHF, ischemic cardiomyopathy, CAD,  CKD stage IIIb, polysubstance abuse who presents after falling out of the bed last night.  He recalls is waking up on the floor.  He thinks that he hit his head as he had some head and neck soreness after waking up.  Over the last week patient had reported complaints of weakness, shortness of breath, and slurred speech.  He had also reported having some intermittent right-sided chest pain, and lower extremity swelling which is new.  He reports that normally his speech is not as slurred and his friend had told him that his face was drooping on the right-hand side.  He reports that he ambulates with use of a cane.  He did admit to using cocaine 2 days ago.  Patient also reports that sometimes he gets confused with what medicines he is supposed to take   In the emergency department patient was noted to be afebrile with pulse 43-66, respirations 15-29, and all other vital signs relatively maintained.  Labs noted elevated MCV and MCH, platelets 110, CO2 18, BUN 55, creatinine 3.07, glucose 154, anion gap 10, alcohol level undetectable, and high-sensitivity troponin 36.  Chest x-ray noted cardiomegaly without any acute abnormality.  Urinalysis showed no acute abnormality.  CT scan of the head noted mild atrophic changes without acute abnormality.  Patient has been bolused 500 mL of normal saline IV fluids.  Assessment & Plan:   Principal Problem:   Acute kidney injury superimposed on chronic kidney disease (HCC) Active Problems:   Acute metabolic encephalopathy   Stroke-like symptoms   Bradycardia   Acute on chronic systolic heart failure (HCC)   Benign essential HTN   Cocaine abuse (HCC)   Acute kidney injury superimposed  on chronic kidney disease chronic kidney disease stage IIIb with mild metabolic acidosis. Patient presents with creatinine elevated up to 3.07 with BUN 55.  Baseline creatinine previously noted to be 1.79-2.3 earlier this year.  Urinalysis did not note any acute abnormality.  Avoid nephrotoxic agents.  Fluid overload is improving but is still there is some fluid that he needs diuresis for.  Creatinine improving.  Continue current Lasix.  Reassess in the morning.   Acute metabolic encephalopathy/strokelike symptoms Patient was noted to be acutely lethargic and difficult to arouse at times.  He also had some slurred speech.  Patient with prior history of polysubstance abuse admitted  to using cocaine 2 days ago.  CT scan of the head noted mild atrophic changes without acute abnormality.  He is now fully alert and oriented.  He tells me that the night that he fell, he took tizanidine as well as Benadryl both.  That can explain the change in mental status.  MRI brain negative for stroke.   Elevated troponin Only slightly elevated and flat and interestingly he has chronically elevated troponins and current elevation is at baseline.  Do not suspect ACS.   Bradycardia Improved.  Continue Coreg.   Acute on chronic systolic congestive heart failure Acute on chronic.  Patient with at least 2+ pitting edema lower extremity.  Chest x-ray had noted cardiomegaly without signs of pulmonary edema.  Last echocardiogram done in February 2020 noted EF to be 35-40% with normal diastolic parameters.  Repeat echo at this time shows slightly  worsened EF 20 to 25%.  Will need follow-up closely with cardiology as outpatient.   Essential hypertension On admission initial blood pressures noted to be soft 84/63.  Blood pressure improving.  Continue to hold Entresto due to AKI.  Continue Coreg.   Cocaine abuse Patient admitted to recently using cocaine.  He tells me that he uses once to 2 times a month.  Counseled regarding  cessation.  He understands that.  DVT prophylaxis: heparin injection 5,000 Units Start: 11/16/22 0915   Code Status: Full Code  Family Communication:  None present at bedside.  Plan of care discussed with patient in length and he/she verbalized understanding and agreed with it.  Status is: Inpatient Remains inpatient appropriate because: Creatinine elevated, still fluid overloaded, needs more IV diuresis.   Estimated body mass index is 28.79 kg/m as calculated from the following:   Height as of this encounter: 5\' 8"  (1.727 m).   Weight as of this encounter: 85.9 kg.    Nutritional Assessment: Body mass index is 28.79 kg/m.Marland Kitchen Seen by dietician.  I agree with the assessment and plan as outlined below: Nutrition Status:        . Skin Assessment: I have examined the patient's skin and I agree with the wound assessment as performed by the wound care RN as outlined below:    Consultants:  None  Procedures:  None  Antimicrobials:  Anti-infectives (From admission, onward)    None         Subjective: Patient seen and went.  He says that he feels much better but still appears to be fluid overloaded and he would feel more comfortable getting IV diuresis here and discharging home tomorrow.  Objective: Vitals:   11/17/22 2112 11/18/22 0500 11/18/22 0542 11/18/22 0913  BP: 130/89  (!) 131/93 125/76  Pulse: 95  96 84  Resp: 18  18 18   Temp: 98.6 F (37 C)  98.5 F (36.9 C) 98.5 F (36.9 C)  TempSrc: Oral  Oral Oral  SpO2: 100%  93% 96%  Weight:  85.9 kg    Height:        Intake/Output Summary (Last 24 hours) at 11/18/2022 1145 Last data filed at 11/18/2022 0900 Gross per 24 hour  Intake 840 ml  Output 900 ml  Net -60 ml    Filed Weights   11/16/22 0246 11/17/22 0500 11/18/22 0500  Weight: 86.2 kg 86.2 kg 85.9 kg    Examination:  General exam: Appears calm and comfortable  Respiratory system: Clear to auscultation. Respiratory effort normal. Cardiovascular  system: S1 & S2 heard, RRR. No JVD, murmurs, rubs, gallops or clicks.  +1 pitting edema bilateral lower extremity. Gastrointestinal system: Abdomen is nondistended, soft and nontender. No organomegaly or masses felt. Normal bowel sounds heard. Central nervous system: Alert and oriented. No focal neurological deficits. Extremities: Symmetric 5 x 5 power. Skin: No rashes, lesions or ulcers.  Psychiatry: Judgement and insight appear normal. Mood & affect appropriate.    Data Reviewed: I have personally reviewed following labs and imaging studies  CBC: Recent Labs  Lab 11/15/22 2358 11/17/22 0412 11/18/22 0103  WBC 4.9 5.5 5.8  NEUTROABS  --   --  3.6  HGB 14.6 14.4 14.7  HCT 45.0 43.5 46.0  MCV 107.1* 103.8* 106.0*  PLT 110* 118* 122*    Basic Metabolic Panel: Recent Labs  Lab 11/15/22 2358 11/16/22 1016 11/17/22 0412 11/18/22 0103  NA 137 139 136 140  K 5.0 5.4* 4.6 4.6  CL  109 113* 107 108  CO2 18* 20* 19* 23  GLUCOSE 154* 125* 86 87  BUN 55* 48* 50* 47*  CREATININE 3.07* 2.86* 2.53* 2.39*  CALCIUM 9.0 9.1 8.9 9.1    GFR: Estimated Creatinine Clearance: 31.5 mL/min (A) (by C-G formula based on SCr of 2.39 mg/dL (H)). Liver Function Tests: Recent Labs  Lab 11/16/22 1016  AST 19  ALT 25  ALKPHOS 98  BILITOT 1.2  PROT 5.8*  ALBUMIN 3.5    No results for input(s): "LIPASE", "AMYLASE" in the last 168 hours. Recent Labs  Lab 11/16/22 1016  AMMONIA 29    Coagulation Profile: Recent Labs  Lab 11/16/22 0137  INR 1.4*    Cardiac Enzymes: No results for input(s): "CKTOTAL", "CKMB", "CKMBINDEX", "TROPONINI" in the last 168 hours. BNP (last 3 results) No results for input(s): "PROBNP" in the last 8760 hours. HbA1C: No results for input(s): "HGBA1C" in the last 72 hours. CBG: Recent Labs  Lab 11/16/22 0005 11/16/22 0034  GLUCAP 163* 140*    Lipid Profile: No results for input(s): "CHOL", "HDL", "LDLCALC", "TRIG", "CHOLHDL", "LDLDIRECT" in the last  72 hours. Thyroid Function Tests: Recent Labs    11/16/22 1016  TSH 4.717*    Anemia Panel: No results for input(s): "VITAMINB12", "FOLATE", "FERRITIN", "TIBC", "IRON", "RETICCTPCT" in the last 72 hours. Sepsis Labs: Recent Labs  Lab 11/16/22 0137  LATICACIDVEN 1.9     Recent Results (from the past 240 hour(s))  Blood Culture (routine x 2)     Status: None (Preliminary result)   Collection Time: 11/16/22  1:37 AM   Specimen: BLOOD  Result Value Ref Range Status   Specimen Description BLOOD BLOOD RIGHT WRIST  Final   Special Requests   Final    BOTTLES DRAWN AEROBIC AND ANAEROBIC Blood Culture adequate volume   Culture   Final    NO GROWTH 2 DAYS Performed at [facility], [address], [place], [place] [address]    Report Status PENDING  Incomplete  Blood Culture (routine x 2)     Status: None (Preliminary result)   Collection Time: 11/16/22  2:00 AM   Specimen: BLOOD LEFT ARM  Result Value Ref Range Status   Specimen Description BLOOD LEFT ARM  Final   Special Requests   Final    BOTTLES DRAWN AEROBIC AND ANAEROBIC Blood Culture adequate volume   Culture   Final    NO GROWTH 2 DAYS Performed at [facility], [address], [place], [place] [address]    Report Status PENDING  Incomplete     Radiology Studies: ECHOCARDIOGRAM COMPLETE  Result Date: 11/17/2022    ECHOCARDIOGRAM REPORT   Patient Name:   ANATOLI NOLA Date of Exam: 11/17/2022 Medical Rec #:  [id number]      Height:       68.0 in Accession #:    [id number]     Weight:       190.0 lb Date of Birth:  May 27, 1956     BSA:          2.000 m Patient Age:    66 years       BP:           113/71 mmHg Patient Gender: M              HR:           88 bpm. Exam Location:  Inpatient Procedure: 2D Echo, Cardiac Doppler, Color Doppler and Intracardiac  Opacification Agent Indications:    Elevated Troponin  History:        Patient has prior history of Echocardiogram examinations, most                  recent 09/06/2020. CHF and Cardiomyopathy,                 Arrythmias:Bradycardia, Signs/Symptoms:Hypotension and Syncope;                 Risk Factors:Hypertension. CKD, stage 3.  Sonographer:    Lucendia Herrlich Referring Phys: [id number] RONDELL A SMITH IMPRESSIONS  1. No LV apical filling defects with Definity contrast. Left ventricular ejection fraction, by estimation, is 20 to 25%. Left ventricular ejection fraction by 2D MOD biplane is 23.2 %. The left ventricle has severely decreased function. The left ventricle demonstrates global hypokinesis. The left ventricular internal cavity size was moderately dilated. Left ventricular diastolic parameters are consistent with Grade II diastolic dysfunction (pseudonormalization). Elevated left ventricular end-diastolic pressure. There is the interventricular septum is flattened in systole and diastole, consistent with right ventricular pressure and volume overload.  2. There appears to be an 0.5 x 1.5 cm RV apical filling defect with Definity contrast, which is suggestive of thrombus. Right ventricular systolic function is severely reduced. The right ventricular size is moderately enlarged.  3. Left atrial size was moderately dilated.  4. Right atrial size was moderately dilated.  5. The mitral valve is abnormal. Severe mitral valve regurgitation.  6. The aortic valve is tricuspid. Aortic valve regurgitation is not visualized.  7. The inferior vena cava is dilated in size with <50% respiratory variability, suggesting right atrial pressure of 15 mmHg. Comparison(s): Changes from prior study are noted. 09/06/2018: LVEF 35-40%, global hypokinesis. FINDINGS  Left Ventricle: No LV apical filling defects with Definity contrast. Left ventricular ejection fraction, by estimation, is 20 to 25%. Left ventricular ejection fraction by 2D MOD biplane is 23.2 %. The left ventricle has severely decreased function. The  left ventricle demonstrates global hypokinesis. Definity contrast  agent was given IV to delineate the left ventricular endocardial borders. The left ventricular internal cavity size was moderately dilated. There is no left ventricular hypertrophy. The interventricular septum is flattened in systole and diastole, consistent with right ventricular pressure and volume overload. Left ventricular diastolic parameters are consistent with Grade II diastolic dysfunction (pseudonormalization). Elevated left ventricular end-diastolic pressure. Right Ventricle: There appears to be an 0.5 x 1.5 cm RV apical filling defect with Definity contrast, which is suggestive of thrombus. The right ventricular size is moderately enlarged. No increase in right ventricular wall thickness. Right ventricular systolic function is severely reduced. Left Atrium: Left atrial size was moderately dilated. Right Atrium: Right atrial size was moderately dilated. Pericardium: There is no evidence of pericardial effusion. Mitral Valve: The mitral valve is abnormal. Severe mitral valve regurgitation, with centrally-directed jet. Tricuspid Valve: The tricuspid valve is grossly normal. Tricuspid valve regurgitation is mild. Aortic Valve: The aortic valve is tricuspid. Aortic valve regurgitation is not visualized. Pulmonic Valve: The pulmonic valve was normal in structure. Pulmonic valve regurgitation is not visualized. Aorta: The aortic root and ascending aorta are structurally normal, with no evidence of dilitation. Venous: The inferior vena cava is dilated in size with less than 50% respiratory variability, suggesting right atrial pressure of 15 mmHg. IAS/Shunts: No atrial level shunt detected by color flow Doppler.   LV Volumes (MOD)  Biplane EF (MOD) LV vol d, MOD    111.7 ml      LV Biplane EF:   Left A2C:                                            ventricular LV vol d, MOD    201.2 ml                       ejection A4C:                                            fraction by LV vol s, MOD    80.5 ml                         2D MOD A2C:                                            biplane is LV vol s, MOD    154.2 ml                       23.2 %. A4C: LV SV MOD A2C:   31.2 ml LV SV MOD A4C:   201.2 ml LV SV MOD BP:    35.3 ml Zoila Shutter MD Electronically signed by Zoila Shutter MD Signature Date/Time: 11/17/2022/4:58:53 PM    Final    MR BRAIN WO CONTRAST  Result Date: 11/16/2022 CLINICAL DATA:  Neuro deficit, acute, stroke suspected. Mental status change, unknown cause. EXAM: MRI HEAD WITHOUT CONTRAST TECHNIQUE: Multiplanar, multiecho pulse sequences of the brain and surrounding structures were obtained without intravenous contrast. COMPARISON:  Head CT 11/16/2022. FINDINGS: Brain: Motion degraded study. No acute infarct or hemorrhage. Mild chronic small-vessel disease. No hydrocephalus or extra-axial collection. No mass or midline shift. No foci of abnormal susceptibility. Vascular: Normal flow voids. Skull and upper cervical spine: Normal marrow signal. Sinuses/Orbits: Unremarkable. Other: None. IMPRESSION: No acute intracranial process. Electronically Signed   By: Orvan Falconer M.D.   On: 11/16/2022 14:19    Scheduled Meds:  aspirin EC  81 mg Oral Daily   atorvastatin  40 mg Oral QHS   carvedilol  6.25 mg Oral BID   famotidine  20 mg Oral BID   furosemide  60 mg Intravenous BID   gabapentin  300 mg Oral BID   heparin  5,000 Units Subcutaneous Q8H   montelukast  10 mg Oral QHS   pantoprazole  40 mg Oral Daily   sodium chloride flush  3 mL Intravenous Q12H   tamsulosin  0.4 mg Oral Daily   Continuous Infusions:   LOS: 2 days   Hughie Closs, MD [facility]  11/18/2022, 11:45 AM   *Please note that this is a verbal dictation therefore any spelling or grammatical errors are due to the "Dragon [employer] One" system interpretation.  Please page via Amion and do not message via secure chat for urgent patient care matters. Secure chat can be used for non urgent patient care matters.  How  to contact the [facility] Attending or Consulting provider 7A - 7P or  covering provider during after hours [place], for this patient?  Check the care team in [facility] and look for a) attending/consulting [facility] provider listed and b) the [facility] team listed. Page or secure chat 7A-7P. Log into [email or web address] and use [place]'s universal password to access. If you do not have the password, please contact the hospital operator. Locate the [facility] provider you are looking for under [facility] and page to a number that you can be directly reached. If you still have difficulty reaching the provider, please page the [facility] (Director on Call) for the Hospitalists listed on amion for assistance.

## 2022-11-18 NOTE — Care Management Important Message (Signed)
Important Message  Patient Details  Name: [name] MRN: [id number] Date of Birth: 1955/07/22   Medicare Important Message Given:  Yes     Tomma Ehinger Stefan Church 11/18/2022, 2:55 PM

## 2022-11-19 DIAGNOSIS — N179 Acute kidney failure, unspecified: Secondary | ICD-10-CM | POA: Diagnosis not present

## 2022-11-19 DIAGNOSIS — N189 Chronic kidney disease, unspecified: Secondary | ICD-10-CM | POA: Diagnosis not present

## 2022-11-19 LAB — BASIC METABOLIC PANEL
Anion gap: 9 (ref 5–15)
BUN: 40 mg/dL — ABNORMAL HIGH (ref 8–23)
CO2: 31 mmol/L (ref 22–32)
Calcium: 8.6 mg/dL — ABNORMAL LOW (ref 8.9–10.3)
Chloride: 99 mmol/L (ref 98–111)
Creatinine, Ser: 2.24 mg/dL — ABNORMAL HIGH (ref 0.61–1.24)
GFR, Estimated: 31 mL/min — ABNORMAL LOW (ref >60)
Glucose, Bld: 104 mg/dL — ABNORMAL HIGH (ref 70–99)
Potassium: 3.9 mmol/L (ref 3.5–5.1)
Sodium: 139 mmol/L (ref 135–145)

## 2022-11-19 LAB — CULTURE, BLOOD (ROUTINE X 2): Culture: NO GROWTH

## 2022-11-19 NOTE — Progress Notes (Signed)
PROGRESS NOTE    [name]  ZOX:[id number] DOB: 1956-03-09 DOA: 11/15/2022 PCP: Raymon Mutton., FNP   Brief Narrative:  HPI: [name] is a 67 y.o. male with medical history significant of hypertension, hyperlipidemia, systolic CHF, ischemic cardiomyopathy, CAD,  CKD stage IIIb, polysubstance abuse who presents after falling out of the bed last night.  He recalls is waking up on the floor.  He thinks that he hit his head as he had some head and neck soreness after waking up.  Over the last week patient had reported complaints of weakness, shortness of breath, and slurred speech.  He had also reported having some intermittent right-sided chest pain, and lower extremity swelling which is new.  He reports that normally his speech is not as slurred and his friend had told him that his face was drooping on the right-hand side.  He reports that he ambulates with use of a cane.  He did admit to using cocaine 2 days ago.  Patient also reports that sometimes he gets confused with what medicines he is supposed to take   In the emergency department patient was noted to be afebrile with pulse 43-66, respirations 15-29, and all other vital signs relatively maintained.  Labs noted elevated MCV and MCH, platelets 110, CO2 18, BUN 55, creatinine 3.07, glucose 154, anion gap 10, alcohol level undetectable, and high-sensitivity troponin 36.  Chest x-ray noted cardiomegaly without any acute abnormality.  Urinalysis showed no acute abnormality.  CT scan of the head noted mild atrophic changes without acute abnormality.  Patient has been bolused 500 mL of normal saline IV fluids.  Assessment & Plan:   Principal Problem:   Acute kidney injury superimposed on chronic kidney disease (HCC) Active Problems:   Acute metabolic encephalopathy   Stroke-like symptoms   Bradycardia   Acute on chronic systolic heart failure (HCC)   Benign essential HTN   Cocaine abuse (HCC)   Acute kidney injury superimposed  on chronic kidney disease chronic kidney disease stage IIIb with mild metabolic acidosis. Patient presents with creatinine elevated up to 3.07 with BUN 55.  Baseline creatinine previously noted to be 1.79-2.3 earlier this year.  Urinalysis did not note any acute abnormality.  Avoid nephrotoxic agents.  Creatinine improved slightly more.  Will continue IV Lasix and reassess tomorrow morning.  Hopefully he will be tuned up by tomorrow for discharge.  Unfortunately, output is not charted well.  But clinically he is improving.   Acute metabolic encephalopathy/strokelike symptoms Patient was noted to be acutely lethargic and difficult to arouse at times.  He also had some slurred speech.  Patient with prior history of polysubstance abuse admitted  to using cocaine 2 days ago.  CT scan of the head noted mild atrophic changes without acute abnormality.  He is now fully alert and oriented.  He tells me that the night that he fell, he took tizanidine as well as Benadryl both.  That can explain the change in mental status.  MRI brain negative for stroke.   Elevated troponin Only slightly elevated and flat and interestingly he has chronically elevated troponins and current elevation is at baseline.  Do not suspect ACS.   Bradycardia Improved.  Continue Coreg.   Acute on chronic systolic congestive heart failure Acute on chronic.  Patient with at least 2+ pitting edema lower extremity.  Chest x-ray had noted cardiomegaly without signs of pulmonary edema.  Last echocardiogram done in February 2020 noted EF to be 35-40% with normal diastolic  parameters.  Repeat echo at this time shows slightly worsened EF 20 to 25%.  Will need follow-up closely with cardiology as outpatient.   Essential hypertension On admission initial blood pressures noted to be soft 84/63.  Blood pressure improving.  Continue to hold Entresto due to AKI.  Continue Coreg.   Cocaine abuse Patient admitted to recently using cocaine.  He tells  me that he uses once to 2 times a month.  Counseled regarding cessation.  He understands that.  DVT prophylaxis: heparin injection 5,000 Units Start: 11/16/22 0915   Code Status: Full Code  Family Communication:  None present at bedside.  Plan of care discussed with patient in length and he/she verbalized understanding and agreed with it.  Status is: Inpatient Remains inpatient appropriate because: Creatinine elevated, still fluid overloaded, needs more IV diuresis.   Estimated body mass index is 27.32 kg/m as calculated from the following:   Height as of this encounter: 5\' 8"  (1.727 m).   Weight as of this encounter: 81.5 kg.    Nutritional Assessment: Body mass index is 27.32 kg/m.Marland Kitchen Seen by dietician.  I agree with the assessment and plan as outlined below: Nutrition Status:        . Skin Assessment: I have examined the patient's skin and I agree with the wound assessment as performed by the wound care RN as outlined below:    Consultants:  None  Procedures:  None  Antimicrobials:  Anti-infectives (From admission, onward)    None         Subjective: Seen and examined.  No complaints.  Objective: Vitals:   11/18/22 2152 11/18/22 2252 11/19/22 0510 11/19/22 0841  BP: 111/79 113/71 104/68 103/76  Pulse: 100 96 66 98  Resp: 18 18 17 19   Temp: 98.3 F (36.8 C) 98.8 F (37.1 C) 97.7 F (36.5 C) 97.7 F (36.5 C)  TempSrc: Oral  Oral Oral  SpO2: 100% 98% 96% 98%  Weight:   81.5 kg   Height:        Intake/Output Summary (Last 24 hours) at 11/19/2022 1035 Last data filed at 11/19/2022 0800 Gross per 24 hour  Intake 600 ml  Output 350 ml  Net 250 ml    Filed Weights   11/17/22 0500 11/18/22 0500 11/19/22 0510  Weight: 86.2 kg 85.9 kg 81.5 kg    Examination:  General exam: Appears calm and comfortable  Respiratory system: Clear to auscultation. Respiratory effort normal. Cardiovascular system: S1 & S2 heard, RRR. No JVD, murmurs, rubs, gallops or  clicks.  Trace pitting edema bilateral lower extremity. Gastrointestinal system: Abdomen is nondistended, soft and nontender. No organomegaly or masses felt. Normal bowel sounds heard. Central nervous system: Alert and oriented. No focal neurological deficits. Extremities: Symmetric 5 x 5 power. Skin: No rashes, lesions or ulcers.  Psychiatry: Judgement and insight appear normal. Mood & affect appropriate.   Data Reviewed: I have personally reviewed following labs and imaging studies  CBC: Recent Labs  Lab 11/15/22 2358 11/17/22 0412 11/18/22 0103  WBC 4.9 5.5 5.8  NEUTROABS  --   --  3.6  HGB 14.6 14.4 14.7  HCT 45.0 43.5 46.0  MCV 107.1* 103.8* 106.0*  PLT 110* 118* 122*    Basic Metabolic Panel: Recent Labs  Lab 11/15/22 2358 11/16/22 1016 11/17/22 0412 11/18/22 0103 11/19/22 0201  NA 137 139 136 140 139  K 5.0 5.4* 4.6 4.6 3.9  CL 109 113* 107 108 99  CO2 18* 20* 19* 23 31  GLUCOSE 154* 125* 86 87 104*  BUN 55* 48* 50* 47* 40*  CREATININE 3.07* 2.86* 2.53* 2.39* 2.24*  CALCIUM 9.0 9.1 8.9 9.1 8.6*    GFR: Estimated Creatinine Clearance: 30.5 mL/min (A) (by C-G formula based on SCr of 2.24 mg/dL (H)). Liver Function Tests: Recent Labs  Lab 11/16/22 1016  AST 19  ALT 25  ALKPHOS 98  BILITOT 1.2  PROT 5.8*  ALBUMIN 3.5    No results for input(s): "LIPASE", "AMYLASE" in the last 168 hours. Recent Labs  Lab 11/16/22 1016  AMMONIA 29    Coagulation Profile: Recent Labs  Lab 11/16/22 0137  INR 1.4*    Cardiac Enzymes: No results for input(s): "CKTOTAL", "CKMB", "CKMBINDEX", "TROPONINI" in the last 168 hours. BNP (last 3 results) No results for input(s): "PROBNP" in the last 8760 hours. HbA1C: No results for input(s): "HGBA1C" in the last 72 hours. CBG: Recent Labs  Lab 11/16/22 0005 11/16/22 0034  GLUCAP 163* 140*    Lipid Profile: No results for input(s): "CHOL", "HDL", "LDLCALC", "TRIG", "CHOLHDL", "LDLDIRECT" in the last 72  hours. Thyroid Function Tests: No results for input(s): "TSH", "T4TOTAL", "FREET4", "T3FREE", "THYROIDAB" in the last 72 hours.  Anemia Panel: No results for input(s): "VITAMINB12", "FOLATE", "FERRITIN", "TIBC", "IRON", "RETICCTPCT" in the last 72 hours. Sepsis Labs: Recent Labs  Lab 11/16/22 0137  LATICACIDVEN 1.9     Recent Results (from the past 240 hour(s))  Blood Culture (routine x 2)     Status: None (Preliminary result)   Collection Time: 11/16/22  1:37 AM   Specimen: BLOOD  Result Value Ref Range Status   Specimen Description BLOOD BLOOD RIGHT WRIST  Final   Special Requests   Final    BOTTLES DRAWN AEROBIC AND ANAEROBIC Blood Culture adequate volume   Culture   Final    NO GROWTH 3 DAYS Performed at [facility], [address] [place], [place] [address]    Report Status PENDING  Incomplete  Blood Culture (routine x 2)     Status: None (Preliminary result)   Collection Time: 11/16/22  2:00 AM   Specimen: BLOOD LEFT ARM  Result Value Ref Range Status   Specimen Description BLOOD LEFT ARM  Final   Special Requests   Final    BOTTLES DRAWN AEROBIC AND ANAEROBIC Blood Culture adequate volume   Culture   Final    NO GROWTH 3 DAYS Performed at [facility], [address], [place], [place] [address]    Report Status PENDING  Incomplete     Radiology Studies: ECHOCARDIOGRAM COMPLETE  Result Date: 11/17/2022    ECHOCARDIOGRAM REPORT   Patient Name:   [name] Date of Exam: 11/17/2022 Medical Rec #:  [id number]      Height:       68.0 in Accession #:    [id number]     Weight:       190.0 lb Date of Birth:  [birth year]     BSA:          2.000 m Patient Age:    66 years       BP:           113/71 mmHg Patient Gender: M              HR:           88 bpm. Exam Location:  Inpatient Procedure: 2D Echo, Cardiac Doppler, Color Doppler and Intracardiac  Opacification Agent Indications:    Elevated Troponin  History:        Patient has prior history of  Echocardiogram examinations, most                 recent 09/06/2020. CHF and Cardiomyopathy,                 Arrythmias:Bradycardia, Signs/Symptoms:Hypotension and Syncope;                 Risk Factors:Hypertension. CKD, stage 3.  Sonographer:    Lucendia Herrlich Referring Phys: [id number] RONDELL A SMITH IMPRESSIONS  1. No LV apical filling defects with Definity contrast. Left ventricular ejection fraction, by estimation, is 20 to 25%. Left ventricular ejection fraction by 2D MOD biplane is 23.2 %. The left ventricle has severely decreased function. The left ventricle demonstrates global hypokinesis. The left ventricular internal cavity size was moderately dilated. Left ventricular diastolic parameters are consistent with Grade II diastolic dysfunction (pseudonormalization). Elevated left ventricular end-diastolic pressure. There is the interventricular septum is flattened in systole and diastole, consistent with right ventricular pressure and volume overload.  2. There appears to be an 0.5 x 1.5 cm RV apical filling defect with Definity contrast, which is suggestive of thrombus. Right ventricular systolic function is severely reduced. The right ventricular size is moderately enlarged.  3. Left atrial size was moderately dilated.  4. Right atrial size was moderately dilated.  5. The mitral valve is abnormal. Severe mitral valve regurgitation.  6. The aortic valve is tricuspid. Aortic valve regurgitation is not visualized.  7. The inferior vena cava is dilated in size with <50% respiratory variability, suggesting right atrial pressure of 15 mmHg. Comparison(s): Changes from prior study are noted. 09/06/2018: LVEF 35-40%, global hypokinesis. FINDINGS  Left Ventricle: No LV apical filling defects with Definity contrast. Left ventricular ejection fraction, by estimation, is 20 to 25%. Left ventricular ejection fraction by 2D MOD biplane is 23.2 %. The left ventricle has severely decreased function. The  left ventricle  demonstrates global hypokinesis. Definity contrast agent was given IV to delineate the left ventricular endocardial borders. The left ventricular internal cavity size was moderately dilated. There is no left ventricular hypertrophy. The interventricular septum is flattened in systole and diastole, consistent with right ventricular pressure and volume overload. Left ventricular diastolic parameters are consistent with Grade II diastolic dysfunction (pseudonormalization). Elevated left ventricular end-diastolic pressure. Right Ventricle: There appears to be an 0.5 x 1.5 cm RV apical filling defect with Definity contrast, which is suggestive of thrombus. The right ventricular size is moderately enlarged. No increase in right ventricular wall thickness. Right ventricular systolic function is severely reduced. Left Atrium: Left atrial size was moderately dilated. Right Atrium: Right atrial size was moderately dilated. Pericardium: There is no evidence of pericardial effusion. Mitral Valve: The mitral valve is abnormal. Severe mitral valve regurgitation, with centrally-directed jet. Tricuspid Valve: The tricuspid valve is grossly normal. Tricuspid valve regurgitation is mild. Aortic Valve: The aortic valve is tricuspid. Aortic valve regurgitation is not visualized. Pulmonic Valve: The pulmonic valve was normal in structure. Pulmonic valve regurgitation is not visualized. Aorta: The aortic root and ascending aorta are structurally normal, with no evidence of dilitation. Venous: The inferior vena cava is dilated in size with less than 50% respiratory variability, suggesting right atrial pressure of 15 mmHg. IAS/Shunts: No atrial level shunt detected by color flow Doppler.   LV Volumes (MOD)  Biplane EF (MOD) LV vol d, MOD    111.7 ml      LV Biplane EF:   Left A2C:                                            ventricular LV vol d, MOD    201.2 ml                       ejection A4C:                                             fraction by LV vol s, MOD    80.5 ml                        2D MOD A2C:                                            biplane is LV vol s, MOD    154.2 ml                       23.2 %. A4C: LV SV MOD A2C:   31.2 ml LV SV MOD A4C:   201.2 ml LV SV MOD BP:    35.3 ml Zoila Shutter MD Electronically signed by Zoila Shutter MD Signature Date/Time: 11/17/2022/4:58:53 PM    Final     Scheduled Meds:  aspirin EC  81 mg Oral Daily   atorvastatin  40 mg Oral QHS   carvedilol  6.25 mg Oral BID   famotidine  20 mg Oral BID   furosemide  60 mg Intravenous BID   gabapentin  300 mg Oral BID   heparin  5,000 Units Subcutaneous Q8H   montelukast  10 mg Oral QHS   pantoprazole  40 mg Oral Daily   sodium chloride flush  3 mL Intravenous Q12H   tamsulosin  0.4 mg Oral Daily   Continuous Infusions:   LOS: 3 days   Hughie Closs, MD [facility]  11/19/2022, 10:35 AM   *Please note that this is a verbal dictation therefore any spelling or grammatical errors are due to the "[employer] One" system interpretation.  Please page via Amion and do not message via secure chat for urgent patient care matters. Secure chat can be used for non urgent patient care matters.  How to contact the [facility] Attending or Consulting provider 7A - 7P or covering provider during after hours 7P -7A, for this patient?  Check the care team in [facility] and look for a) attending/consulting [facility] provider listed and b) the [facility] team listed. Page or secure chat 7A-7P. Log into [email or web address] and use [place]'s universal password to access. If you do not have the password, please contact the hospital operator. Locate the [facility] provider you are looking for under [facility] and page to a number that you can be directly reached. If you still have difficulty reaching the provider, please page the [facility] (Director on Call) for the Hospitalists listed on amion for assistance.

## 2022-11-20 DIAGNOSIS — N179 Acute kidney failure, unspecified: Secondary | ICD-10-CM | POA: Diagnosis not present

## 2022-11-20 DIAGNOSIS — N189 Chronic kidney disease, unspecified: Secondary | ICD-10-CM | POA: Diagnosis not present

## 2022-11-20 LAB — BASIC METABOLIC PANEL
Anion gap: 11 (ref 5–15)
BUN: 38 mg/dL — ABNORMAL HIGH (ref 8–23)
CO2: 30 mmol/L (ref 22–32)
Calcium: 8.9 mg/dL (ref 8.9–10.3)
Chloride: 99 mmol/L (ref 98–111)
Creatinine, Ser: 2.3 mg/dL — ABNORMAL HIGH (ref 0.61–1.24)
GFR, Estimated: 30 mL/min — ABNORMAL LOW (ref >60)
Glucose, Bld: 109 mg/dL — ABNORMAL HIGH (ref 70–99)
Potassium: 4.3 mmol/L (ref 3.5–5.1)
Sodium: 140 mmol/L (ref 135–145)

## 2022-11-20 LAB — CULTURE, BLOOD (ROUTINE X 2)

## 2022-11-20 MED ORDER — CARVEDILOL 6.25 MG PO TABS: 6.2500 mg | ORAL_TABLET | Freq: Two times a day (BID) | ORAL | 0 refills | Status: DC

## 2022-11-20 MED ORDER — METHYLPREDNISOLONE SODIUM SUCC 125 MG IJ SOLR
125.0000 mg | Freq: Once | INTRAMUSCULAR | Status: AC
  Administered 2022-11-20: 125 mg via INTRAVENOUS
  Filled 2022-11-20: qty 2

## 2022-11-20 MED ORDER — PREDNISONE 50 MG PO TABS: 50.0000 mg | ORAL_TABLET | Freq: Every day | ORAL | 0 refills | Status: AC

## 2022-11-20 NOTE — Plan of Care (Signed)

## 2022-11-20 NOTE — Progress Notes (Signed)
DISCHARGE NOTE HOME [name] to be discharged Home per MD order. Discussed prescriptions and follow up appointments with the patient. Prescriptions given to patient; medication list explained in detail. Patient verbalized understanding.  Skin clean, dry and intact without evidence of skin break down, no evidence of skin tears noted. IV catheter discontinued intact. Site without signs and symptoms of complications. Dressing and pressure applied. Pt denies pain at the site currently. No complaints noted.  Patient free of lines, drains, and wounds.   An After Visit Summary (AVS) was printed and given to the patient. Patient escorted via wheelchair, and discharged home via private auto.  Margarita Grizzle, RN

## 2022-11-20 NOTE — Discharge Summary (Signed)
Physician Discharge Summary  [name] ZOX:[id number] DOB: [birth year] DOA: 11/15/2022  PCP: Raymon Mutton., FNP  Admit date: 11/15/2022 Discharge date: 11/20/2022 30 Day Unplanned Readmission Risk Score    Flowsheet Row ED to Hosp-Admission (Current) from 11/15/2022 in [facility] 23M KIDNEY UNIT  30 Day Unplanned Readmission Risk Score (%) 23.66 Filed at 11/20/2022 0801       This score is the patient's risk of an unplanned readmission within 30 days of being discharged (0 -100%). The score is based on dignosis, age, lab data, medications, orders, and past utilization.   Low:  0-14.9   Medium: 15-21.9   High: 22-29.9   Extreme: 30 and above          Admitted From: Home Disposition: Home  Recommendations for Outpatient Follow-up:  Follow up with PCP in 1-2 weeks Please obtain BMP/CBC in one week Please follow up with your PCP on the following pending results: Unresulted Labs (From admission, onward)     Start     Ordered   11/16/22 1846  Urea nitrogen, urine  Once,   R        11/16/22 1845              Home Health: None Equipment/Devices: None  Discharge Condition: Stable CODE STATUS: Full code  Diet recommendation: Cardiac  Subjective: Seen and examined.  Breathing feels well.  He is complaining of left great toe with pain and has swelling.  No other complaint.  This appears to be gout based on the clinical examination.  Patient is still willing to go home.  Brief/Interim Summary: [name] is a 67 y.o. male with medical history significant of hypertension, hyperlipidemia, systolic CHF, ischemic cardiomyopathy, CAD,  CKD stage IIIb, polysubstance abuse who presented after falling out of the bed. He recalls is waking up on the floor.  He thinks that he hit his head as he had some head and neck soreness after waking up.  Over the last week patient had reported complaints of weakness, shortness of breath, and slurred speech.  He had also reported having  some intermittent right-sided chest pain, and lower extremity swelling which is new.  He reports that normally his speech is not as slurred and his friend had told him that his face was drooping on the right-hand side. he ambulates with use of a cane.  He did admit to using cocaine 2 days ago.  Patient also reports that sometimes he gets confused with what medicines he is supposed to take   In the emergency department patient was noted to be afebrile with pulse 43-66, respirations 15-29, and all other vital signs relatively maintained.  Labs noted elevated MCV and MCH, platelets 110, CO2 18, BUN 55, creatinine 3.07, glucose 154, anion gap 10, alcohol level undetectable, and high-sensitivity troponin 36.  Chest x-ray noted cardiomegaly without any acute abnormality.  Urinalysis showed no acute abnormality.  CT scan of the head noted mild atrophic changes without acute abnormality.  Patient has been bolused 500 mL of normal saline IV fluids.  He was admitted to hospital service with following.  Acute kidney injury superimposed on chronic kidney disease chronic kidney disease stage IIIb with mild metabolic acidosis. Patient presented with creatinine elevated up to 3.07 with BUN 55.  Baseline creatinine previously noted to be 1.79-2.3 earlier this year.  Urinalysis did not note any acute abnormality.  He was started on IV diuresis, creatinine improved and down to 2.5 which is close to his  baseline and perhaps this is his baseline.    Acute metabolic encephalopathy/strokelike symptoms Patient was noted to be acutely lethargic and difficult to arouse at times.  He also had some slurred speech.  Patient with prior history of polysubstance abuse admitted  to using cocaine 2 days ago.  CT scan of the head noted mild atrophic changes without acute abnormality.  He is now fully alert and oriented.  He tells me that the night that he fell, he took tizanidine as well as Benadryl both.  That can explain the change in  mental status.  MRI brain negative for stroke.   Elevated troponin Only slightly elevated and flat and interestingly he has chronically elevated troponins and current elevation is at baseline.  Do not suspect ACS.   Bradycardia Improved.  He reduced his Coreg from 12.5 mg dose 6.25 mg p.o. twice daily.   Acute on chronic systolic congestive heart failure Acute on chronic.  Patient with at least 2+ pitting edema lower extremity.  Chest x-ray had noted cardiomegaly without signs of pulmonary edema.  Last echocardiogram done in February 2020 noted EF to be 35-40% with normal diastolic parameters.  Repeat echo at this time shows slightly worsened EF 20 to 25%.  Patient was diuresed here.  Improved significantly.  He had +2 edema upon admission and currently no edema at all.   Essential hypertension On admission initial blood pressures noted to be soft 84/63.  Entresto and Coreg was held initially.  Blood pressure improved.   Cocaine abuse Patient admitted to recently using cocaine.  He tells me that he uses once to 2 times a month.  Counseled regarding cessation.  He understands that.  Left great toe gout: Received 1 dose of Solu-Medrol 125 mg today.  Discharging him on prednisone 50 mg p.o. daily for next 4 days starting tomorrow.  Discharge plan was discussed with patient and/or family member and they verbalized understanding and agreed with it.  Discharge Diagnoses:  Principal Problem:   Acute kidney injury superimposed on chronic kidney disease (HCC) Active Problems:   Acute metabolic encephalopathy   Stroke-like symptoms   Bradycardia   Acute on chronic systolic heart failure (HCC)   Benign essential HTN   Cocaine abuse (HCC)   Gouty arthritis of left great toe    Discharge Instructions   Allergies as of 11/20/2022       Reactions   Tape Rash   Please use paper tape        Medication List     STOP taking these medications    allopurinol 300 MG tablet Commonly known  as: ZYLOPRIM   doxycycline 100 MG capsule Commonly known as: VIBRAMYCIN       TAKE these medications    albuterol 108 (90 Base) MCG/ACT inhaler Commonly known as: VENTOLIN HFA Inhale 2 puffs into the lungs every 4 (four) hours as needed for wheezing or shortness of breath.   aspirin EC 81 MG tablet Take 1 tablet (81 mg total) by mouth daily.   atorvastatin 40 MG tablet Commonly known as: LIPITOR Take 40 mg by mouth at bedtime.   b complex vitamins tablet Take 1 tablet by mouth daily.   carvedilol 6.25 MG tablet Commonly known as: COREG Take 1 tablet (6.25 mg total) by mouth 2 (two) times daily. What changed:  medication strength how much to take Another medication with the same name was removed. Continue taking this medication, and follow the directions you see here.   clotrimazole-betamethasone cream  Commonly known as: LOTRISONE Apply 1 application topically 2 (two) times daily.   Entresto 49-51 MG Generic drug: sacubitril-valsartan Take 2 tablets by mouth 2 (two) times daily.   famotidine 20 MG tablet Commonly known as: PEPCID Take 20 mg by mouth 2 (two) times daily.   furosemide 80 MG tablet Commonly known as: LASIX Take 40 mg by mouth 2 (two) times daily. What changed: Another medication with the same name was removed. Continue taking this medication, and follow the directions you see here.   gabapentin 600 MG tablet Commonly known as: NEURONTIN Take 600 mg by mouth 2 (two) times daily. What changed: Another medication with the same name was removed. Continue taking this medication, and follow the directions you see here.   Misc. Devices Misc Blood pressure monitor Dx: Hypertension, CHF   montelukast 10 MG tablet Commonly known as: SINGULAIR Take 10 mg by mouth at bedtime.   nitroGLYCERIN 0.4 MG SL tablet Commonly known as: NITROSTAT Place 1 tablet (0.4 mg total) under the tongue every 5 (five) minutes x 3 doses as needed for chest pain.    pantoprazole 40 MG tablet Commonly known as: PROTONIX Take 40 mg by mouth daily.   potassium chloride SA 20 MEQ tablet Commonly known as: KLOR-CON M Take 20 mEq by mouth daily.   predniSONE 50 MG tablet Commonly known as: DELTASONE Take 1 tablet (50 mg total) by mouth daily with breakfast for 4 days. Start taking on: Nov 21, 2022   tamsulosin 0.4 MG Caps capsule Commonly known as: FLOMAX Take 0.4 mg by mouth daily.   tiZANidine 4 MG tablet Commonly known as: ZANAFLEX Take 1 tablet (4 mg total) by mouth every 6 (six) hours as needed for muscle spasms. MUST MAKE APPT FOR FURTHER REFILLS               Durable Medical Equipment  (From admission, onward)           Start     Ordered   11/17/22 1423  For home use only DME Cane  Once       Comments: Single Point   11/17/22 1422            Follow-up Information     [facility]. Go in 18 day(s).   Specialty: Cardiology Why: Hospital follow up 12/07/2022 @ 11 am PLEASE bring a current medication list to appointment FREE valet parking, Entrance C, off [employer] information: [address] [id number] mc [place] [address] [phone]        Raymon Mutton., FNP Follow up in 1 week(s).   Specialty: Family Medicine Contact information: [address] [place] [address] [phone]                Allergies  Allergen Reactions   Tape Rash    Please use paper tape    Consultations: None   Procedures/Studies: ECHOCARDIOGRAM COMPLETE  Result Date: 11/17/2022    ECHOCARDIOGRAM REPORT   Patient Name:   DONTERIOUS BATTS Date of Exam: 11/17/2022 Medical Rec #:  [id number]      Height:       68.0 in Accession #:    [id number]     Weight:       190.0 lb Date of Birth:  10-Nov-1955     BSA:          2.000 m Patient Age:    25 years  BP:           113/71 mmHg Patient Gender: M              HR:           88 bpm. Exam  Location:  Inpatient Procedure: 2D Echo, Cardiac Doppler, Color Doppler and Intracardiac            Opacification Agent Indications:    Elevated Troponin  History:        Patient has prior history of Echocardiogram examinations, most                 recent 09/06/2020. CHF and Cardiomyopathy,                 Arrythmias:Bradycardia, Signs/Symptoms:Hypotension and Syncope;                 Risk Factors:Hypertension. CKD, stage 3.  Sonographer:    Lucendia Herrlich Referring Phys: [id number] RONDELL A SMITH IMPRESSIONS  1. No LV apical filling defects with Definity contrast. Left ventricular ejection fraction, by estimation, is 20 to 25%. Left ventricular ejection fraction by 2D MOD biplane is 23.2 %. The left ventricle has severely decreased function. The left ventricle demonstrates global hypokinesis. The left ventricular internal cavity size was moderately dilated. Left ventricular diastolic parameters are consistent with Grade II diastolic dysfunction (pseudonormalization). Elevated left ventricular end-diastolic pressure. There is the interventricular septum is flattened in systole and diastole, consistent with right ventricular pressure and volume overload.  2. There appears to be an 0.5 x 1.5 cm RV apical filling defect with Definity contrast, which is suggestive of thrombus. Right ventricular systolic function is severely reduced. The right ventricular size is moderately enlarged.  3. Left atrial size was moderately dilated.  4. Right atrial size was moderately dilated.  5. The mitral valve is abnormal. Severe mitral valve regurgitation.  6. The aortic valve is tricuspid. Aortic valve regurgitation is not visualized.  7. The inferior vena cava is dilated in size with <50% respiratory variability, suggesting right atrial pressure of 15 mmHg. Comparison(s): Changes from prior study are noted. 09/06/2018: LVEF 35-40%, global hypokinesis. FINDINGS  Left Ventricle: No LV apical filling defects with Definity contrast. Left  ventricular ejection fraction, by estimation, is 20 to 25%. Left ventricular ejection fraction by 2D MOD biplane is 23.2 %. The left ventricle has severely decreased function. The  left ventricle demonstrates global hypokinesis. Definity contrast agent was given IV to delineate the left ventricular endocardial borders. The left ventricular internal cavity size was moderately dilated. There is no left ventricular hypertrophy. The interventricular septum is flattened in systole and diastole, consistent with right ventricular pressure and volume overload. Left ventricular diastolic parameters are consistent with Grade II diastolic dysfunction (pseudonormalization). Elevated left ventricular end-diastolic pressure. Right Ventricle: There appears to be an 0.5 x 1.5 cm RV apical filling defect with Definity contrast, which is suggestive of thrombus. The right ventricular size is moderately enlarged. No increase in right ventricular wall thickness. Right ventricular systolic function is severely reduced. Left Atrium: Left atrial size was moderately dilated. Right Atrium: Right atrial size was moderately dilated. Pericardium: There is no evidence of pericardial effusion. Mitral Valve: The mitral valve is abnormal. Severe mitral valve regurgitation, with centrally-directed jet. Tricuspid Valve: The tricuspid valve is grossly normal. Tricuspid valve regurgitation is mild. Aortic Valve: The aortic valve is tricuspid. Aortic valve regurgitation is not visualized. Pulmonic Valve: The pulmonic valve was normal in structure. Pulmonic valve regurgitation is not visualized.  Aorta: The aortic root and ascending aorta are structurally normal, with no evidence of dilitation. Venous: The inferior vena cava is dilated in size with less than 50% respiratory variability, suggesting right atrial pressure of 15 mmHg. IAS/Shunts: No atrial level shunt detected by color flow Doppler.   LV Volumes (MOD)               Biplane EF (MOD) LV vol d,  MOD    111.7 ml      LV Biplane EF:   Left A2C:                                            ventricular LV vol d, MOD    201.2 ml                       ejection A4C:                                            fraction by LV vol s, MOD    80.5 ml                        2D MOD A2C:                                            biplane is LV vol s, MOD    154.2 ml                       23.2 %. A4C: LV SV MOD A2C:   31.2 ml LV SV MOD A4C:   201.2 ml LV SV MOD BP:    35.3 ml Zoila Shutter MD Electronically signed by Zoila Shutter MD Signature Date/Time: 11/17/2022/4:58:53 PM    Final    MR BRAIN WO CONTRAST  Result Date: 11/16/2022 CLINICAL DATA:  Neuro deficit, acute, stroke suspected. Mental status change, unknown cause. EXAM: MRI HEAD WITHOUT CONTRAST TECHNIQUE: Multiplanar, multiecho pulse sequences of the brain and surrounding structures were obtained without intravenous contrast. COMPARISON:  Head CT 11/16/2022. FINDINGS: Brain: Motion degraded study. No acute infarct or hemorrhage. Mild chronic small-vessel disease. No hydrocephalus or extra-axial collection. No mass or midline shift. No foci of abnormal susceptibility. Vascular: Normal flow voids. Skull and upper cervical spine: Normal marrow signal. Sinuses/Orbits: Unremarkable. Other: None. IMPRESSION: No acute intracranial process. Electronically Signed   By: Orvan Falconer M.D.   On: 11/16/2022 14:19   CT Head Wo Contrast  Result Date: 11/16/2022 CLINICAL DATA:  Recent fall with headaches, initial encounter EXAM: CT HEAD WITHOUT CONTRAST TECHNIQUE: Contiguous axial images were obtained from the base of the skull through the vertex without intravenous contrast. RADIATION DOSE REDUCTION: This exam was performed according to the departmental dose-optimization program which includes automated exposure control, adjustment of the mA and/or kV according to patient size and/or use of iterative reconstruction technique. COMPARISON:  None Available. FINDINGS: Brain:  No evidence of acute infarction, hemorrhage, hydrocephalus, extra-axial collection or mass lesion/mass effect. Mild atrophic changes are noted. Vascular: No hyperdense vessel or unexpected calcification. Skull: Normal. Negative for fracture or focal lesion. Sinuses/Orbits:  No acute finding. Other: None IMPRESSION: Mild atrophic changes without acute abnormality. Electronically Signed   By: Alcide Clever M.D.   On: 11/16/2022 02:35   DG CHEST PORT 1 VIEW  Result Date: 11/16/2022 CLINICAL DATA:  Shortness of breath. EXAM: PORTABLE CHEST 1 VIEW COMPARISON:  October 06, 2022 FINDINGS: The cardiac silhouette is moderately enlarged and mildly increased in size when compared to the prior study. This may be, in part, technical in origin. Mild to moderate severity calcification of the aortic arch is seen. Low lung volumes are noted. Both lungs are clear. The visualized skeletal structures are unremarkable. IMPRESSION: Cardiomegaly and low lung volumes without evidence of acute or active cardiopulmonary disease. Electronically Signed   By: Aram Candela M.D.   On: 11/16/2022 01:00     Discharge Exam: Vitals:   11/19/22 2105 11/20/22 0750  BP: 127/84 110/76  Pulse: (!) 101 (!) 102  Resp: 17 17  Temp: 98.9 F (37.2 C) 98.8 F (37.1 C)  SpO2: 99% 96%   Vitals:   11/19/22 2052 11/19/22 2105 11/20/22 0710 11/20/22 0750  BP: (!) 177/84 127/84  110/76  Pulse: 61 (!) 101  (!) 102  Resp: 18 17  17   Temp: 98.5 F (36.9 C) 98.9 F (37.2 C)  98.8 F (37.1 C)  TempSrc: Oral Oral  Oral  SpO2: 100% 99%  96%  Weight:   79.1 kg   Height:        General: Pt is alert, awake, not in acute distress Cardiovascular: RRR, S1/S2 +, no rubs, no gallops Respiratory: CTA bilaterally, no wheezing, no rhonchi Abdominal: Soft, NT, ND, bowel sounds + Extremities: no edema, no cyanosis, left great toe base swelling with tenderness.    The results of significant diagnostics from this hospitalization (including imaging,  microbiology, ancillary and laboratory) are listed below for reference.     Microbiology: Recent Results (from the past 240 hour(s))  Blood Culture (routine x 2)     Status: None (Preliminary result)   Collection Time: 11/16/22  1:37 AM   Specimen: BLOOD  Result Value Ref Range Status   Specimen Description BLOOD BLOOD RIGHT WRIST  Final   Special Requests   Final    BOTTLES DRAWN AEROBIC AND ANAEROBIC Blood Culture adequate volume   Culture   Final    NO GROWTH 4 DAYS Performed at [facility], [address] [place], [place] [address]    Report Status PENDING  Incomplete  Blood Culture (routine x 2)     Status: None (Preliminary result)   Collection Time: 11/16/22  2:00 AM   Specimen: BLOOD LEFT ARM  Result Value Ref Range Status   Specimen Description BLOOD LEFT ARM  Final   Special Requests   Final    BOTTLES DRAWN AEROBIC AND ANAEROBIC Blood Culture adequate volume   Culture   Final    NO GROWTH 4 DAYS Performed at [facility], [address] [place], [place] [address]    Report Status PENDING  Incomplete     Labs: BNP (last 3 results) Recent Labs    08/31/22 0335 10/06/22 1057 11/16/22 1016  BNP 1,495.8* >4,500.0* 4,270.9*   Basic Metabolic Panel: Recent Labs  Lab 11/16/22 1016 11/17/22 0412 11/18/22 0103 11/19/22 0201 11/20/22 0017  NA 139 136 140 139 140  K 5.4* 4.6 4.6 3.9 4.3  CL 113* 107 108 99 99  CO2 20* 19* 23 31 30   GLUCOSE 125* 86 87 104* 109*  BUN 48* 50* 47* 40* 38*  CREATININE 2.86* 2.53* 2.39* 2.24* 2.30*  CALCIUM 9.1 8.9 9.1 8.6* 8.9   Liver Function Tests: Recent Labs  Lab 11/16/22 1016  AST 19  ALT 25  ALKPHOS 98  BILITOT 1.2  PROT 5.8*  ALBUMIN 3.5   No results for input(s): "LIPASE", "AMYLASE" in the last 168 hours. Recent Labs  Lab 11/16/22 1016  AMMONIA 29   CBC: Recent Labs  Lab 11/15/22 2358 11/17/22 0412 11/18/22 0103  WBC 4.9 5.5 5.8  NEUTROABS  --   --  3.6  HGB 14.6 14.4 14.7  HCT 45.0  43.5 46.0  MCV 107.1* 103.8* 106.0*  PLT 110* 118* 122*   Cardiac Enzymes: No results for input(s): "CKTOTAL", "CKMB", "CKMBINDEX", "TROPONINI" in the last 168 hours. BNP: Invalid input(s): "POCBNP" CBG: Recent Labs  Lab 11/16/22 0005 11/16/22 0034  GLUCAP 163* 140*   D-Dimer No results for input(s): "DDIMER" in the last 72 hours. Hgb A1c No results for input(s): "HGBA1C" in the last 72 hours. Lipid Profile No results for input(s): "CHOL", "HDL", "LDLCALC", "TRIG", "CHOLHDL", "LDLDIRECT" in the last 72 hours. Thyroid function studies No results for input(s): "TSH", "T4TOTAL", "T3FREE", "THYROIDAB" in the last 72 hours.  Invalid input(s): "FREET3" Anemia work up No results for input(s): "VITAMINB12", "FOLATE", "FERRITIN", "TIBC", "IRON", "RETICCTPCT" in the last 72 hours. Urinalysis    Component Value Date/Time   COLORURINE YELLOW 11/16/2022 0125   APPEARANCEUR CLEAR 11/16/2022 0125   LABSPEC 1.009 11/16/2022 0125   PHURINE 5.0 11/16/2022 0125   GLUCOSEU NEGATIVE 11/16/2022 0125   HGBUR NEGATIVE 11/16/2022 0125   BILIRUBINUR NEGATIVE 11/16/2022 0125   KETONESUR NEGATIVE 11/16/2022 0125   PROTEINUR 100 (A) 11/16/2022 0125   UROBILINOGEN 1.0 05/13/2015 1854   NITRITE NEGATIVE 11/16/2022 0125   LEUKOCYTESUR NEGATIVE 11/16/2022 0125   Sepsis Labs Recent Labs  Lab 11/15/22 2358 11/17/22 0412 11/18/22 0103  WBC 4.9 5.5 5.8   Microbiology Recent Results (from the past 240 hour(s))  Blood Culture (routine x 2)     Status: None (Preliminary result)   Collection Time: 11/16/22  1:37 AM   Specimen: BLOOD  Result Value Ref Range Status   Specimen Description BLOOD BLOOD RIGHT WRIST  Final   Special Requests   Final    BOTTLES DRAWN AEROBIC AND ANAEROBIC Blood Culture adequate volume   Culture   Final    NO GROWTH 4 DAYS Performed at [facility], [address] [place], [place] [address]    Report Status PENDING  Incomplete  Blood Culture (routine x 2)      Status: None (Preliminary result)   Collection Time: 11/16/22  2:00 AM   Specimen: BLOOD LEFT ARM  Result Value Ref Range Status   Specimen Description BLOOD LEFT ARM  Final   Special Requests   Final    BOTTLES DRAWN AEROBIC AND ANAEROBIC Blood Culture adequate volume   Culture   Final    NO GROWTH 4 DAYS Performed at [facility], [address], [place], [place] [address]    Report Status PENDING  Incomplete     Time coordinating discharge: Over 30 minutes  SIGNED:   Hughie Closs, MD  [facility] 11/20/2022, 10:26 AM *Please note that this is a verbal dictation therefore any spelling or grammatical errors are due to the "Dragon [employer] One" system interpretation. If 7PM-7AM, please contact night-coverage [email or web address]

## 2022-11-20 NOTE — Progress Notes (Signed)
Went to patient's room to check why he was no longer showing on monitoring for Telemetry.  He had taken his box off.  He stated he no longer wanted to wear it especially since he was going home on Sunday.  I explained why we needed to continue to monitor his heart rhythm but he continued to politely refuse.  Dr. Antionette Char made aware.  CCMD made aware and patient put on stand by.  Bernie Covey RN

## 2022-12-06 ENCOUNTER — Telehealth (HOSPITAL_COMMUNITY): Payer: Self-pay

## 2022-12-06 NOTE — Progress Notes (Addendum)
HEART & VASCULAR TRANSITION OF CARE CONSULT NOTE     Referring Physician: Dr. Karrie Doffing with [facility] Primary Care: Fatima Sanger, NP Primary Cardiologist: Dr. Sharyn Lull  HPI: Referred to clinic by Dr. Karrie Doffing with [facility] for heart failure consultation. 67 y.o. male with history of chronic systolic CHF/NICM, prior NSTEMI in setting of cocaine use, HTN, HLD, CKD IIIb, polysubstance abuse (cocaine, ETOH).    Admitted 08/2014 with ACS in setting of cocaine use. Had also been off BP medications as well. Coronaries clean on LHC. Echo with EF 20-25%.   He's had numerous admissions over the years for a/c CHF after stopping his medications and using cocaine.   Echo 04/2015: EF 25-30%  Echo 05/2016: EF 20-25%  Echo 02/20: EF 35-40%  Admitted 11/15/22 after he fell out of bed and woke up on the floor. Had used cocaine 2 days prior and not sure what meds he was taking. He was admitted for AKI, change in mental status (stroke ruled out, possibly d/t polypharmacy), and a/c CHF.  Also treated with steroids for left great toe gout. He was diuresed. Discharged on coreg 6.25 BID (reduced d/t bradycardia), entresto 49/51 mg BID and lasix 40 BID. Echo during admit with EF 20-25%, grade II DD, RV apical filling defect with contrast suggestive of thrombus, RV severely reduced, moderate BAE, severe MR.  He is here today for hospital follow-up.  He has been taking all medications as prescribed.  Reports he has been more short of breath the last few days and recently developed abdominal bloating and orthopnea. His weight in hospital day of discharge was 174 lb, up to 192 lb today (clothes and shoes on).   Reports one beer a week, does not use tobacco. Last used cocaine 4 days ago.  Lives in an apartment in [place]. No food insecurity. Does have trouble with transportation.    Past Medical History:  Diagnosis Date   Anxiety    Arthritis    "all over" (01/07/2015)   CKD (chronic kidney disease), stage IV (HCC)     Depression    Headache    "probably weekly" (01/07/2015)   History of echocardiogram 12/2014   EF 20-25% -> improved up to EF 35 and 40% February 2020:   Homelessness    Hypercholesterolemia    Hypertension    Ischemic dilated cardiomyopathy (HCC)    Noncompliance    Nonischemic dilated cardiomyopathy (HCC) 07/2014   Initial EF was 2025% with global hypokinesis.  As of February 2020 EF up to 35-40%, GRII DD.  Global hypokinesis.  No WMA.  Relatively normal valves.   NSTEMI (non-ST elevated myocardial infarction) [facility]) 2016   2 separate episodes in January and June.  Normal coronary arteries by cardiac catheterization.  Thought to be related to.   Polysubstance abuse (HCC)    etoh, cocaine   Urinary hesitancy    Walking pneumonia 07/2014    Current Outpatient Medications  Medication Sig Dispense Refill   albuterol (VENTOLIN HFA) 108 (90 Base) MCG/ACT inhaler Inhale 2 puffs into the lungs every 4 (four) hours as needed for wheezing or shortness of breath. 1 each 0   apixaban (ELIQUIS) 5 MG TABS tablet Take 1 tablet (5 mg total) by mouth 2 (two) times daily. 60 tablet 5   atorvastatin (LIPITOR) 40 MG tablet Take 40 mg by mouth at bedtime.     carvedilol (COREG) 6.25 MG tablet Take 1 tablet (6.25 mg total) by mouth 2 (two) times daily. 60 tablet 0  clotrimazole-betamethasone (LOTRISONE) cream Apply 1 application topically 2 (two) times daily. 45 g 0   dapagliflozin propanediol (FARXIGA) 10 MG TABS tablet Take 1 tablet (10 mg total) by mouth daily before breakfast. 30 tablet 11   famotidine (PEPCID) 20 MG tablet Take 20 mg by mouth 2 (two) times daily.     furosemide (LASIX) 80 MG tablet Take 40 mg by mouth 2 (two) times daily.     gabapentin (NEURONTIN) 600 MG tablet Take 600 mg by mouth 2 (two) times daily.     Misc. Devices MISC Blood pressure monitor Dx: Hypertension, CHF 1 each 0   montelukast (SINGULAIR) 10 MG tablet Take 10 mg by mouth at bedtime.     nitroGLYCERIN (NITROSTAT)  0.4 MG SL tablet Place 1 tablet (0.4 mg total) under the tongue every 5 (five) minutes x 3 doses as needed for chest pain. 30 tablet 1   potassium chloride SA (K-DUR,KLOR-CON) 20 MEQ tablet Take 20 mEq by mouth daily.  3   sacubitril-valsartan (ENTRESTO) 49-51 MG Take 1 tablet by mouth 2 (two) times daily.     tamsulosin (FLOMAX) 0.4 MG CAPS capsule Take 0.4 mg by mouth daily.     tiZANidine (ZANAFLEX) 4 MG tablet Take 1 tablet (4 mg total) by mouth every 6 (six) hours as needed for muscle spasms. MUST MAKE APPT FOR FURTHER REFILLS 30 tablet 0   No current facility-administered medications for this encounter.    Allergies  Allergen Reactions   Tape Rash    Please use paper tape      Social History   Socioeconomic History   Marital status: Single    Spouse name: Not on file   Number of children: 0   Years of education: Not on file   Highest education level: 11th grade  Occupational History   Occupation: retired  Tobacco Use   Smoking status: Former    Packs/day: 1.50    Years: 30.00    Additional pack years: 0.00    Total pack years: 45.00    Types: Cigarettes    Quit date: 02/19/2004    Years since quitting: 18.8   Smokeless tobacco: Former   Tobacco comments:    quit 2005  Vaping Use   Vaping Use: Never used  Substance and Sexual Activity   Alcohol use: Yes    Alcohol/week: 2.0 - 4.0 standard drinks of alcohol    Types: 2 - 4 Cans of beer per week    Comment: once weekly   Drug use: Yes    Types: "Crack" cocaine    Comment: 2-3 weeks ago   Sexual activity: Never    Comment: crack  Other Topics Concern   Not on file  Social History Narrative   Not on file   Social Determinants of Health   Financial Resource Strain: Low Risk  (11/18/2022)   Overall Financial Resource Strain (CARDIA)    Difficulty of Paying Living Expenses: Not very hard  Food Insecurity: No Food Insecurity (11/16/2022)   Hunger Vital Sign    Worried About Running Out of Food in the Last Year:  Never true    Ran Out of Food in the Last Year: Never true  Transportation Needs: Unmet Transportation Needs (12/07/2022)   PRAPARE - Administrator, Civil Service (Medical): Yes    Lack of Transportation (Non-Medical): No  Physical Activity: Not on file  Stress: Not on file  Social Connections: Not on file  Intimate Partner Violence: Not At Risk (  11/16/2022)   Humiliation, Afraid, Rape, and Kick questionnaire    Fear of Current or Ex-Partner: No    Emotionally Abused: No    Physically Abused: No    Sexually Abused: No     No family history on file.  Vitals:   12/07/22 1228  BP: (!) 124/92  Pulse: 95  SpO2: 97%  Weight: 87.1 kg (192 lb)    PHYSICAL EXAM: General:  Well appearing. Ambulated into clinic HEENT: normal Neck: supple. Jvp 10 cm. Carotids 2+ bilat; no bruits.  Cor: PMI nondisplaced. Regular rate & rhythm. No rubs, gallops or murmurs. Lungs: clear Abdomen: soft, nontender, + distended.  Extremities: no cyanosis, clubbing, rash, edema Neuro: alert & oriented x 3, cranial nerves grossly intact. moves all 4 extremities w/o difficulty. Affect pleasant.  ECG: SR 94 bpm, RBBB with very wide QRS 198 ms   ASSESSMENT & PLAN: HFrEF/NICM -Suspect d/t cocaine +/- hypertension. No significant CAD on LHC in 2016. -Echo 08/2014: EF 20-25% -Echo 04/2015: EF 25-30% -Echo 05/2016: EF 20-25% -Echo 02/20: EF 35-40% -Echo 05/24: EF 20-25%, grade II DD, RV apical filling defect with contrast suggestive of thrombus, RV severely reduced, moderate BAE, severe MR. NYHA II/early III GDMT  Diuretic-He is volume up on exam and has gained weight. Continue Lasix 40 mg BID (refilled). Adding Farxiga 10 mg daily which will help with diuresis. BB-Coreg 6.25 mg BID, refilled Ace/ARB/ARNI-Entresto 49/51 mg BID MRA-No, CKD IV SGLT2i-Add Farxiga 10 mg daily Could consider Bidil next -Discussed critical importance of discontinuing cocaine use and compliance with medical therapy. If  he continues down this trajectory, I worry that his overall prognosis will be poor.  Severe MR -Could consider MitraClip in the future if he is able to demonstrate compliance  RV thrombus -Noted on echo. Images reviewed with Dr. Shirlee Latch. -Start Eliquis 5 mg BID. Stop aspirin (no recent MI, stroke, or stent)  CKD IIIb/IV -Scr baseline seems to be low 2s -Add farxiga as above  Polysubstance abuse -No longer smokes cigarettes -Has used cocaine for years. Last used 4 days ago. Offered substance abuse resources. He believes he can quit on his own. I'm not sure he is ready.  HTN -Meds as above  SDOH: has stable housing and denies food insecurity. Needs assistance with transportation. HF CSW assisted with arranging Taxi today.   Referred to HFSW (PCP, Medications, Transportation, ETOH Abuse, Drug Abuse, Insurance, Financial ): Yes, transportation Refer to Pharmacy: Yes, meds Refer to Home Health: No Refer to Advanced Heart Failure Clinic: Yes  Refer to General Cardiology: No  Follow up  2 weeks with APP to assess volume, consider Bidil next visit depending on blood pressure. 4 weeks to establish with Dr. Shirlee Latch. Will follow along with Dr. Sharyn Lull

## 2022-12-06 NOTE — Progress Notes (Signed)
   [facility] Transitions of Care Clinic Heart Failure Pharmacist Encounter  PCP: Raymon Mutton., FNP PCP-Cardiologist: None  HPI:  67 YO male with a PMH of HTN, HLD, systolic CHF, ischemic cardiomyopathy, CAD, CKD stage IIIb, polysubstance abuse    Patient presented to the ED 4/30 with weakness and shortness of breath. Chest x-ray noted cardiomegaly without signs of pulmonary edema. BNP 4270 with 2+ pitting edema of lower extremity noted on admission. Last echo done in 08/2018 with EF noted to be 35-40%. Repeat echo done on 5/2 showed EF 20-25%, global hypokinesis, G2DD, severe MVR, RAP 15 mmHg. The patient reports compliance with his HF medications, but denies compliance with various pain medications (tizanidine, gabapentin).   Patient has not had any new encounters since his discharge from the hospital on 5/5.   Today, Allon Helming presents to the Heart Failure TOC Clinic for follow up.   Shortness of breath/dyspnea on exertion? {YES NO:22349}  Orthopnea/PND? {YES NO:22349} Edema? {YES NO:22349} Lightheadedness/dizziness? {YES NO:22349} Daily weights at home? {YES NO:22349} Blood pressure/heart rate monitoring at home? {YES [id number] Following low-sodium/fluid-restricted diet? {YES NO:22349} Taking medications as prescribed? {YES NO:22349}  HF Medications: Diuretic: furosemide 40 mg BID Beta Blocker: carvedilol 6.25 mg BID ACE/ARB/ARNI: Entresto 49/51 mg BID  Has the patient been experiencing any side effects to the medications prescribed?  {YES NO:22349}  Does the patient have any problems obtaining medications due to transportation or finances?   {YES NO:22349}  Understanding of regimen: {excellent/good/fair/poor:19665} Understanding of indications: {excellent/good/fair/poor:19665} Potential of compliance: {excellent/good/fair/poor:19665} Patient understands to avoid NSAIDs. Patient understands to avoid decongestants.   Pertinent Lab Values: Serum  creatinine ***, BUN ***, Potassium ***, Sodium ***, BNP ***, Magnesium ***, Digoxin ***   Vital Signs: Weight: *** lbs (discharge weight: *** lbs) Blood pressure: ***  Heart rate: ***   Medication Assistance / Insurance Benefits Check: Does the patient have prescription insurance?  Yes Type of insurance plan: Medicare, Medicaid  Does the patient qualify for medication assistance through manufacturers or grants?   No Eligible grants and/or patient assistance programs: None Medication assistance applications in progress: None  Medication assistance applications approved: None  Outpatient Pharmacy:  Current outpatient pharmacy: *** Was the [facility] pharmacy used to supply discharge medications? {YES NO:22349}  If TOC pharmacy was used, were the refills transferred out to current pharmacy yet? {YES NO:22349}  Is the patient willing to transition their outpatient pharmacy to utilize a [facility] outpatient pharmacy with or without mail order?   {Yes/No/Pending:24180}  Assessment: 1) Chronic ***systolic CHF (EF ***), due to ***. NYHA class *** symptoms. -   Plan: 1) Medication changes: -   2) Patient Assistance: -  3) Follow up: - Next appointment with *** on ***   Cherylin Mylar, PharmD PGY1 Pharmacy Resident 5/21/202410:43 PM

## 2022-12-06 NOTE — Telephone Encounter (Signed)
Called to confirm Heart & Vascular Transitions of Care appointment at 12/07/22. Patient reminded to bring all medications and pill box organizer with them. Gave directions, instructed to utilize valet parking.  Left message to confirm appointment 

## 2022-12-07 ENCOUNTER — Encounter (HOSPITAL_COMMUNITY): Payer: Self-pay

## 2022-12-07 ENCOUNTER — Ambulatory Visit (HOSPITAL_COMMUNITY)
Admit: 2022-12-07 | Discharge: 2022-12-07 | Disposition: A | Discharge status: Home / Self Care | Payer: 59 | Attending: Physician Assistant | Admitting: Physician Assistant

## 2022-12-07 ENCOUNTER — Other Ambulatory Visit (HOSPITAL_COMMUNITY): Payer: Self-pay

## 2022-12-07 VITALS — BP 124/92 | HR 95 | Wt 192.0 lb

## 2022-12-07 DIAGNOSIS — I130 Hypertensive heart and chronic kidney disease with heart failure and stage 1 through stage 4 chronic kidney disease, or unspecified chronic kidney disease: Secondary | ICD-10-CM | POA: Insufficient documentation

## 2022-12-07 DIAGNOSIS — Z7984 Long term (current) use of oral hypoglycemic drugs: Secondary | ICD-10-CM | POA: Diagnosis not present

## 2022-12-07 DIAGNOSIS — I5022 Chronic systolic (congestive) heart failure: Secondary | ICD-10-CM | POA: Insufficient documentation

## 2022-12-07 DIAGNOSIS — N1832 Chronic kidney disease, stage 3b: Secondary | ICD-10-CM

## 2022-12-07 DIAGNOSIS — Z79899 Other long term (current) drug therapy: Secondary | ICD-10-CM | POA: Diagnosis not present

## 2022-12-07 DIAGNOSIS — I340 Nonrheumatic mitral (valve) insufficiency: Secondary | ICD-10-CM

## 2022-12-07 DIAGNOSIS — I428 Other cardiomyopathies: Secondary | ICD-10-CM

## 2022-12-07 DIAGNOSIS — Z87891 Personal history of nicotine dependence: Secondary | ICD-10-CM | POA: Diagnosis not present

## 2022-12-07 DIAGNOSIS — I255 Ischemic cardiomyopathy: Secondary | ICD-10-CM | POA: Insufficient documentation

## 2022-12-07 DIAGNOSIS — Z7901 Long term (current) use of anticoagulants: Secondary | ICD-10-CM | POA: Insufficient documentation

## 2022-12-07 DIAGNOSIS — I5020 Unspecified systolic (congestive) heart failure: Secondary | ICD-10-CM

## 2022-12-07 DIAGNOSIS — F1410 Cocaine abuse, uncomplicated: Secondary | ICD-10-CM | POA: Insufficient documentation

## 2022-12-07 DIAGNOSIS — Z5982 Transportation insecurity: Secondary | ICD-10-CM | POA: Diagnosis not present

## 2022-12-07 DIAGNOSIS — F1010 Alcohol abuse, uncomplicated: Secondary | ICD-10-CM | POA: Insufficient documentation

## 2022-12-07 DIAGNOSIS — I252 Old myocardial infarction: Secondary | ICD-10-CM | POA: Diagnosis not present

## 2022-12-07 DIAGNOSIS — I513 Intracardiac thrombosis, not elsewhere classified: Secondary | ICD-10-CM | POA: Diagnosis not present

## 2022-12-07 DIAGNOSIS — I420 Dilated cardiomyopathy: Secondary | ICD-10-CM | POA: Insufficient documentation

## 2022-12-07 DIAGNOSIS — N184 Chronic kidney disease, stage 4 (severe): Secondary | ICD-10-CM | POA: Diagnosis not present

## 2022-12-07 LAB — COMPREHENSIVE METABOLIC PANEL
ALT: 34 U/L (ref 0–44)
AST: 27 U/L (ref 15–41)
Albumin: 3.5 g/dL (ref 3.5–5.0)
Alkaline Phosphatase: 93 U/L (ref 38–126)
Anion gap: 6 (ref 5–15)
BUN: 29 mg/dL — ABNORMAL HIGH (ref 8–23)
CO2: 26 mmol/L (ref 22–32)
Calcium: 8.7 mg/dL — ABNORMAL LOW (ref 8.9–10.3)
Chloride: 108 mmol/L (ref 98–111)
Creatinine, Ser: 2.21 mg/dL — ABNORMAL HIGH (ref 0.61–1.24)
GFR, Estimated: 32 mL/min — ABNORMAL LOW (ref >60)
Glucose, Bld: 129 mg/dL — ABNORMAL HIGH (ref 70–99)
Potassium: 4.2 mmol/L (ref 3.5–5.1)
Sodium: 140 mmol/L (ref 135–145)
Total Bilirubin: 1.8 mg/dL — ABNORMAL HIGH (ref 0.3–1.2)
Total Protein: 5.9 g/dL — ABNORMAL LOW (ref 6.5–8.1)

## 2022-12-07 LAB — BRAIN NATRIURETIC PEPTIDE: B Natriuretic Peptide: 2959.2 pg/mL — ABNORMAL HIGH (ref 0.0–100.0)

## 2022-12-07 MED ORDER — FUROSEMIDE 80 MG PO TABS: 40.0000 mg | ORAL_TABLET | Freq: Two times a day (BID) | ORAL | 2 refills | Status: DC

## 2022-12-07 MED ORDER — CARVEDILOL 6.25 MG PO TABS: 6.2500 mg | ORAL_TABLET | Freq: Two times a day (BID) | ORAL | 2 refills | Status: DC

## 2022-12-07 MED ORDER — APIXABAN 5 MG PO TABS: 5.0000 mg | ORAL_TABLET | Freq: Two times a day (BID) | ORAL | 5 refills | Status: DC

## 2022-12-07 MED ORDER — DAPAGLIFLOZIN PROPANEDIOL 10 MG PO TABS: 10.0000 mg | ORAL_TABLET | Freq: Every day | ORAL | 11 refills | Status: DC

## 2022-12-07 NOTE — Progress Notes (Signed)
H&V Care Navigation CSW Progress Note  Clinical Social Worker consulted by provider to reach out to patient who was late to appt to confirm he knew he had an appt and had transport.  Pt had forgotten appt and did not have a way to get here.  CSW able to arrange taxi to bring to appt.  Pt reports transport can be an issue for him.  Has rides to [employer] (his PCP) but otherwise has to depend on friends to bring him places and it is very unreliable.  CSW informed pt of [employer] and his Medicaid transportation benefit- he will plan on reaching out to discuss transportation.  CSW then talked with pt about current substance use.  Pt admits to weekly cocaine use but states this is an improvement from recent years.  States he sometimes uses when he is stressed but more often when he is around people he normally uses with.  States that he moved to senior housing about 6 months ago and that has made a big difference in the frequency of use.  Also states he attends virtual NA meetings daily and has a sponsor who he can call when needed- does not want any further resources at this time.  Is motivated to quit with new knowledge of his health condition.   SDOH Screenings   Food Insecurity: No Food Insecurity (11/16/2022)  Housing: Low Risk  (11/18/2022)  Transportation Needs: Unmet Transportation Needs (12/07/2022)  Utilities: Not At Risk (11/16/2022)  Alcohol Screen: Low Risk  (11/18/2022)  Depression (PHQ2-9): Low Risk  (09/20/2018)  Financial Resource Strain: Low Risk  (11/18/2022)  Tobacco Use: Medium Risk (12/07/2022)   Burna Sis, LCSW Clinical Social Worker Advanced [facility] Failure Clinic Desk#: [phone] Cell#: [phone]

## 2022-12-07 NOTE — Patient Instructions (Signed)
EKG done today.  Labs done today. We will contact you only if your labs are abnormal.  STOP taking Aspirin  START Farxiga 10mg  (1 tablet) by mouth daily.   START Eliquis 5mg  (1 tablet) by mouth 2 times daily.   No other medication changes were made. Please continue all current medications as prescribed.  Your physician recommends that you schedule a follow-up appointment in: 2 weeks with our NP/PA Clinic and in 1 month with Dr. Shirlee Latch both appointments here in our office.   If you have any questions or concerns before your next appointment please send [place] a message through [place] or call our office at [phone].    TO LEAVE A MESSAGE FOR THE NURSE SELECT OPTION 2, PLEASE LEAVE A MESSAGE INCLUDING: YOUR NAME DATE OF BIRTH CALL BACK NUMBER REASON FOR CALL**this is important as we prioritize the call backs  YOU WILL RECEIVE A CALL BACK THE SAME DAY AS LONG AS YOU CALL BEFORE 4:00 PM   Do the following things EVERYDAY: Weigh yourself in the morning before breakfast. Write it down and keep it in a log. Take your medicines as prescribed Eat low salt foods--Limit salt (sodium) to 2000 mg per day.  Stay as active as you can everyday Limit all fluids for the day to less than 2 liters   At the [facility], you and your health needs are our priority. As part of our continuing mission to provide you with exceptional heart care, we have created designated Provider Care Teams. These Care Teams include your primary Cardiologist (physician) and Advanced Practice Providers (APPs- Physician Assistants and Nurse Practitioners) who all work together to provide you with the care you need, when you need it.   You may see any of the following providers on your designated Care Team at your next follow up: Dr Arvilla Meres Dr Marca Ancona Dr. Marcos Eke, NP Robbie Lis, [place] [facility] [place], [place] Brynda Peon, NP Karle Plumber,  PharmD   Please be sure to bring in all your medications bottles to every appointment.    Thank you for choosing [facility]

## 2022-12-17 ENCOUNTER — Emergency Department (HOSPITAL_COMMUNITY): Payer: 59

## 2022-12-17 ENCOUNTER — Other Ambulatory Visit: Payer: Self-pay

## 2022-12-17 ENCOUNTER — Emergency Department (HOSPITAL_COMMUNITY)
Admission: EM | Admit: 2022-12-17 | Discharge: 2022-12-17 | Disposition: A | Discharge status: Home / Self Care | Payer: 59 | Attending: Emergency Medicine | Admitting: Emergency Medicine

## 2022-12-17 ENCOUNTER — Encounter (HOSPITAL_COMMUNITY): Payer: Self-pay

## 2022-12-17 DIAGNOSIS — L03114 Cellulitis of left upper limb: Secondary | ICD-10-CM | POA: Diagnosis not present

## 2022-12-17 DIAGNOSIS — I5042 Chronic combined systolic (congestive) and diastolic (congestive) heart failure: Secondary | ICD-10-CM | POA: Insufficient documentation

## 2022-12-17 DIAGNOSIS — I130 Hypertensive heart and chronic kidney disease with heart failure and stage 1 through stage 4 chronic kidney disease, or unspecified chronic kidney disease: Secondary | ICD-10-CM | POA: Insufficient documentation

## 2022-12-17 DIAGNOSIS — N184 Chronic kidney disease, stage 4 (severe): Secondary | ICD-10-CM | POA: Diagnosis not present

## 2022-12-17 DIAGNOSIS — Z7901 Long term (current) use of anticoagulants: Secondary | ICD-10-CM | POA: Insufficient documentation

## 2022-12-17 DIAGNOSIS — Z79899 Other long term (current) drug therapy: Secondary | ICD-10-CM | POA: Insufficient documentation

## 2022-12-17 LAB — BASIC METABOLIC PANEL
Anion gap: 15 (ref 5–15)
BUN: 19 mg/dL (ref 8–23)
CO2: 20 mmol/L — ABNORMAL LOW (ref 22–32)
Calcium: 9.5 mg/dL (ref 8.9–10.3)
Chloride: 102 mmol/L (ref 98–111)
Creatinine, Ser: 1.91 mg/dL — ABNORMAL HIGH (ref 0.61–1.24)
GFR, Estimated: 38 mL/min — ABNORMAL LOW (ref >60)
Glucose, Bld: 117 mg/dL — ABNORMAL HIGH (ref 70–99)
Potassium: 4 mmol/L (ref 3.5–5.1)
Sodium: 137 mmol/L (ref 135–145)

## 2022-12-17 LAB — CBC WITH DIFFERENTIAL/PLATELET
Abs Immature Granulocytes: 0.01 10*3/uL (ref 0.00–0.07)
Basophils Absolute: 0.1 10*3/uL (ref 0.0–0.1)
Basophils Relative: 1 %
Eosinophils Absolute: 0.2 10*3/uL (ref 0.0–0.5)
Eosinophils Relative: 3 %
HCT: 43.5 % (ref 39.0–52.0)
Hemoglobin: 13.8 g/dL (ref 13.0–17.0)
Immature Granulocytes: 0 %
Lymphocytes Relative: 24 %
Lymphs Abs: 1.5 10*3/uL (ref 0.7–4.0)
MCH: 33.7 pg (ref 26.0–34.0)
MCHC: 31.7 g/dL (ref 30.0–36.0)
MCV: 106.1 fL — ABNORMAL HIGH (ref 80.0–100.0)
Monocytes Absolute: 0.6 10*3/uL (ref 0.1–1.0)
Monocytes Relative: 10 %
Neutro Abs: 3.9 10*3/uL (ref 1.7–7.7)
Neutrophils Relative %: 62 %
Platelets: 183 10*3/uL (ref 150–400)
RBC: 4.1 MIL/uL — ABNORMAL LOW (ref 4.22–5.81)
RDW: 13.8 % (ref 11.5–15.5)
WBC: 6.2 10*3/uL (ref 4.0–10.5)
nRBC: 0 % (ref 0.0–0.2)

## 2022-12-17 LAB — TROPONIN I (HIGH SENSITIVITY)
Troponin I (High Sensitivity): 25 ng/L — ABNORMAL HIGH (ref <18)
Troponin I (High Sensitivity): 19 ng/L — ABNORMAL HIGH (ref <18)

## 2022-12-17 LAB — BRAIN NATRIURETIC PEPTIDE: B Natriuretic Peptide: >4500 pg/mL — ABNORMAL HIGH (ref 0.0–100.0)

## 2022-12-17 MED ORDER — HYDROMORPHONE HCL 1 MG/ML IJ SOLN
1.0000 mg | Freq: Once | INTRAMUSCULAR | Status: AC
  Administered 2022-12-17: 1 mg via INTRAVENOUS
  Filled 2022-12-17: qty 1

## 2022-12-17 MED ORDER — SODIUM CHLORIDE 0.9 % IV BOLUS
1000.0000 mL | Freq: Once | INTRAVENOUS | Status: AC
  Administered 2022-12-17: 1000 mL via INTRAVENOUS

## 2022-12-17 MED ORDER — CEFUROXIME AXETIL 500 MG PO TABS: 500.0000 mg | ORAL_TABLET | Freq: Two times a day (BID) | ORAL | 0 refills | Status: DC

## 2022-12-17 MED ORDER — OXYCODONE HCL 5 MG PO TABS: 5.0000 mg | ORAL_TABLET | Freq: Four times a day (QID) | ORAL | 0 refills | Status: DC | PRN

## 2022-12-17 MED ORDER — ONDANSETRON HCL 4 MG/2ML IJ SOLN
4.0000 mg | Freq: Once | INTRAMUSCULAR | Status: AC
  Administered 2022-12-17: 4 mg via INTRAVENOUS
  Filled 2022-12-17: qty 2

## 2022-12-17 MED ORDER — ACETAMINOPHEN 325 MG PO TABS: 650.0000 mg | ORAL_TABLET | Freq: Four times a day (QID) | ORAL | 0 refills | Status: DC | PRN

## 2022-12-17 MED ORDER — MORPHINE SULFATE (PF) 2 MG/ML IV SOLN
2.0000 mg | Freq: Once | INTRAVENOUS | Status: AC
  Administered 2022-12-17: 2 mg via INTRAVENOUS
  Filled 2022-12-17: qty 1

## 2022-12-17 MED ORDER — SODIUM CHLORIDE 0.9 % IV SOLN
1.0000 g | Freq: Once | INTRAVENOUS | Status: AC
  Administered 2022-12-17: 1 g via INTRAVENOUS
  Filled 2022-12-17: qty 10

## 2022-12-17 MED ORDER — FENTANYL CITRATE PF 50 MCG/ML IJ SOSY
50.0000 ug | PREFILLED_SYRINGE | Freq: Once | INTRAMUSCULAR | Status: AC
  Administered 2022-12-17: 50 ug via INTRAVENOUS
  Filled 2022-12-17: qty 1

## 2022-12-17 NOTE — ED Notes (Signed)
PT ao x 4.  Given coke.  Tolerating fluids well.

## 2022-12-17 NOTE — Discharge Instructions (Signed)
It was a pleasure caring for you today in the emergency department.  Please return to the emergency department for any worsening or worrisome symptoms.  Please return to ED if you develop nausea/vomiting, fever, worsening pain/swelling to your left forearm or any worsening/worrisome symptoms.

## 2022-12-17 NOTE — ED Triage Notes (Signed)
Patient BIB [facility] from home for hand swelling and pain x3 days. EMS reports swelling, redness, tenderness to palpation and movement, and warmth to the patient's L hand. VSS. NAD. 10/10 pain

## 2022-12-17 NOTE — ED Provider Notes (Signed)
[place] [facility]ERGENCY DEPARTMENT AT [facility] Provider Note  CSN: [id number] Arrival date & time: 12/17/22 1214  Chief Complaint(s) Hand Pain  HPI [name] is a 67 y.o. male with past medical history as below, significant for HFrEF ef 20-25%, dilated cardiomyopathy, polysubstance abuse, CKD, arthritis, HLD, HTN who presents to the ED with complaint of left forearm cellulitis/rash/pain.  Patient noticed pain and swelling to his left forearm, left wrist over the past 2 days.  Gradually worsening.  No fevers, chills, nausea or vomiting.  Reduced range of motion to his left wrist in conjunction with the swelling.  Feels the swelling has worsened over the past 24 hours.  No IV drug use, no fevers in the past 24 hours, no nausea or vomiting.  Patient also reports some intermittent chest pain over the past 24 hours he attributes to being unable to sleep last night due to the discomfort in his left forearm.  No dyspnea.  Past Medical History Past Medical History:  Diagnosis Date   Anxiety    Arthritis    "all over" (01/07/2015)   CKD (chronic kidney disease), stage IV (HCC)    Depression    Headache    "probably weekly" (01/07/2015)   History of echocardiogram 12/2014   EF 20-25% -> improved up to EF 35 and 40% February 2020:   Homelessness    Hypercholesterolemia    Hypertension    Ischemic dilated cardiomyopathy (HCC)    Noncompliance    Nonischemic dilated cardiomyopathy (HCC) 07/2014   Initial EF was 2025% with global hypokinesis.  As of February 2020 EF up to 35-40%, GRII DD.  Global hypokinesis.  No WMA.  Relatively normal valves.   NSTEMI (non-ST elevated myocardial infarction) [facility]) 2016   2 separate episodes in January and June.  Normal coronary arteries by cardiac catheterization.  Thought to be related to.   Polysubstance abuse (HCC)    etoh, cocaine   Urinary hesitancy    Walking pneumonia 07/2014   Patient Active Problem List   Diagnosis Date Noted   Acute  kidney injury superimposed on chronic kidney disease (HCC) 11/16/2022   Acute metabolic encephalopathy 11/16/2022   Stroke-like symptoms 11/16/2022   Bradycardia 11/16/2022   Cocaine abuse (HCC) 11/16/2022   Non-ischemic cardiomyopathy (HCC) 11/21/2018   Syncope and collapse 09/06/2018   Neuropathy 03/03/2017   Lower urinary tract symptoms 01/06/2017   Acute on chronic systolic heart failure (HCC) 07/05/2016   Macrocytosis without anemia 07/05/2016   Hypotension 06/14/2016   Generalized anxiety disorder 06/13/2016   Dermatitis 01/27/2016   Troponin level elevated    CKD (chronic kidney disease) stage 3, GFR 30-59 ml/min (HCC) 05/03/2015   Chronic combined systolic and diastolic congestive heart failure (HCC) 05/03/2015   Benign essential HTN 05/03/2015   Polysubstance abuse (HCC)    Noncompliance    Gouty arthritis of left great toe 03/24/2015   Home Medication(s) Prior to Admission medications   Medication Sig Start Date End Date Taking? Authorizing Provider  acetaminophen (TYLENOL) 325 MG tablet Take 2 tablets (650 mg total) by mouth every 6 (six) hours as needed. 12/17/22  Yes Tanda Rockers A, DO  cefUROXime (CEFTIN) 500 MG tablet Take 1 tablet (500 mg total) by mouth 2 (two) times daily with a meal for 7 days. 12/17/22 12/24/22 Yes Tanda Rockers A, DO  oxyCODONE (ROXICODONE) 5 MG immediate release tablet Take 1 tablet (5 mg total) by mouth every 6 (six) hours as needed for severe pain. 12/17/22  Yes  Tanda Rockers A, DO  albuterol (VENTOLIN HFA) 108 (90 Base) MCG/ACT inhaler Inhale 2 puffs into the lungs every 4 (four) hours as needed for wheezing or shortness of breath. 08/31/22   Horton, Mayer Masker, MD  apixaban (ELIQUIS) 5 MG TABS tablet Take 1 tablet (5 mg total) by mouth 2 (two) times daily. 12/07/22   Andrey Farmer, PA-C  atorvastatin (LIPITOR) 40 MG tablet Take 40 mg by mouth at bedtime. 08/25/22   [provider]  carvedilol (COREG) 6.25 MG tablet Take 1 tablet (6.25 mg  total) by mouth 2 (two) times daily. 12/07/22   Andrey Farmer, PA-C  clotrimazole-betamethasone (LOTRISONE) cream Apply 1 application topically 2 (two) times daily. 02/04/19   Hoy Register, MD  dapagliflozin propanediol (FARXIGA) 10 MG TABS tablet Take 1 tablet (10 mg total) by mouth daily before breakfast. 12/07/22   Andrey Farmer, PA-C  famotidine (PEPCID) 20 MG tablet Take 20 mg by mouth 2 (two) times daily. 11/08/22   [provider]  furosemide (LASIX) 80 MG tablet Take 0.5 tablets (40 mg total) by mouth 2 (two) times daily. 12/07/22   Andrey Farmer, PA-C  gabapentin (NEURONTIN) 600 MG tablet Take 600 mg by mouth 2 (two) times daily. 11/07/22   [provider]  Misc. Devices MISC Blood pressure monitor Dx: Hypertension, CHF 11/01/18   Newlin, Enobong, MD  montelukast (SINGULAIR) 10 MG tablet Take 10 mg by mouth at bedtime. 08/27/22   [provider]  nitroGLYCERIN (NITROSTAT) 0.4 MG SL tablet Place 1 tablet (0.4 mg total) under the tongue every 5 (five) minutes x 3 doses as needed for chest pain. 01/06/17   Hoy Register, MD  potassium chloride SA (K-DUR,KLOR-CON) 20 MEQ tablet Take 20 mEq by mouth daily. 06/04/16   [provider]  sacubitril-valsartan (ENTRESTO) 49-51 MG Take 1 tablet by mouth 2 (two) times daily.    [provider]  tamsulosin (FLOMAX) 0.4 MG CAPS capsule Take 0.4 mg by mouth daily. 11/07/22   [provider]  tiZANidine (ZANAFLEX) 4 MG tablet Take 1 tablet (4 mg total) by mouth every 6 (six) hours as needed for muscle spasms. MUST MAKE APPT FOR FURTHER REFILLS 02/22/19   Hoy Register, MD                                                                                                                                    Past Surgical History Past Surgical History:  Procedure Laterality Date   LEFT HEART CATHETERIZATION WITH CORONARY ANGIOGRAM N/A 08/15/2014   Procedure: LEFT HEART CATHETERIZATION WITH  CORONARY ANGIOGRAM;  Surgeon: Robynn Pane, MD;  Location: [facility] CATH LAB;  Service: Cardiovascular;; normal coronary arteries.  Severely elevated LVEDP of 35 mmHg.   TRANSTHORACIC ECHOCARDIOGRAM  08/2018   EF 35-40% (up from 20-25% in 2017).  No MR WMA.  Global HK.  GRII DD.  Normal valves.   Family History  History reviewed. No pertinent family history.  Social History Social History   Tobacco Use   Smoking status: Former    Packs/day: 1.50    Years: 30.00    Additional pack years: 0.00    Total pack years: 45.00    Types: Cigarettes    Quit date: 02/19/2004    Years since quitting: 18.8   Smokeless tobacco: Former   Tobacco comments:    quit 2005  Vaping Use   Vaping Use: Never used  Substance Use Topics   Alcohol use: Yes    Alcohol/week: 2.0 - 4.0 standard drinks of alcohol    Types: 2 - 4 Cans of beer per week    Comment: once weekly   Drug use: Yes    Types: "Crack" cocaine    Comment: 2-3 weeks ago   Allergies Tape  Review of Systems Review of Systems  Constitutional:  Negative for chills and fever.  HENT:  Negative for facial swelling and trouble swallowing.   Eyes:  Negative for photophobia and visual disturbance.  Respiratory:  Negative for cough and shortness of breath.   Cardiovascular:  Positive for chest pain. Negative for palpitations.  Gastrointestinal:  Negative for abdominal pain, nausea and vomiting.  Endocrine: Negative for polydipsia and polyuria.  Genitourinary:  Negative for difficulty urinating and hematuria.  Musculoskeletal:  Positive for joint swelling and myalgias. Negative for gait problem.  Skin:  Positive for rash. Negative for pallor.  Neurological:  Negative for syncope and headaches.  Psychiatric/Behavioral:  Negative for agitation and confusion.     Physical Exam Vital Signs  I have reviewed the triage vital signs BP 117/78   Pulse 94   Temp 97.7 F (36.5 C) (Oral)   Resp 15   Ht 5\' 8"  (1.727 m)   Wt 87.1 kg   SpO2 98%    BMI 29.19 kg/m  Physical Exam Vitals and nursing note reviewed.  Constitutional:      General: He is not in acute distress.    Appearance: Normal appearance. He is well-developed.  HENT:     Head: Normocephalic and atraumatic.     Right Ear: External ear normal.     Left Ear: External ear normal.     Mouth/Throat:     Mouth: Mucous membranes are moist.  Eyes:     General: No scleral icterus. Cardiovascular:     Rate and Rhythm: Normal rate and regular rhythm.     Pulses: Normal pulses.     Heart sounds: Normal heart sounds.  Pulmonary:     Effort: Pulmonary effort is normal. No respiratory distress.     Breath sounds: Normal breath sounds.  Abdominal:     General: Abdomen is flat.     Palpations: Abdomen is soft.     Tenderness: There is no abdominal tenderness.  Musculoskeletal:        General: Normal range of motion.     Right lower leg: Edema present.     Left lower leg: Edema present.  Skin:    General: Skin is warm and dry.     Capillary Refill: Capillary refill takes less than 2 seconds.          Comments: Swelling, warmth to left forearm/left wrist.  Wrist range of motion of left wrist secondary to swelling and pain.  Equal radial pulses.  Capillary refill is brisk.  Neurological:     Mental Status: He is alert and oriented to person, place, and time.  GCS: GCS eye subscore is 4. GCS verbal subscore is 5. GCS motor subscore is 6.  Psychiatric:        Mood and Affect: Mood normal.        Behavior: Behavior normal.     ED Results and Treatments Labs (all labs ordered are listed, but only abnormal results are displayed) Labs Reviewed  CBC WITH DIFFERENTIAL/PLATELET - Abnormal; Notable for the following components:      Result Value   RBC 4.10 (*)    MCV 106.1 (*)    All other components within normal limits  BASIC METABOLIC PANEL - Abnormal; Notable for the following components:   CO2 20 (*)    Glucose, Bld 117 (*)    Creatinine, Ser 1.91 (*)    GFR,  Estimated 38 (*)    All other components within normal limits  BRAIN NATRIURETIC PEPTIDE - Abnormal; Notable for the following components:   B Natriuretic Peptide >4,500.0 (*)    All other components within normal limits  TROPONIN I (HIGH SENSITIVITY) - Abnormal; Notable for the following components:   Troponin I (High Sensitivity) 25 (*)    All other components within normal limits  TROPONIN I (HIGH SENSITIVITY) - Abnormal; Notable for the following components:   Troponin I (High Sensitivity) 19 (*)    All other components within normal limits  CULTURE, BLOOD (SINGLE)                                                                                                                          Radiology DG Chest Portable 1 View  Result Date: 12/17/2022 CLINICAL DATA:  Chest pain EXAM: PORTABLE CHEST 1 VIEW COMPARISON:  Chest x-ray dated Nov 16, 2022 FINDINGS: Unchanged cardiomegaly. Lungs are clear. No evidence of pleural effusion or pneumothorax. Moderate degenerative changes of the bilateral shoulders. IMPRESSION: No active disease. Electronically Signed   By: Allegra Lai M.D.   On: 12/17/2022 13:37   DG Wrist Complete Left  Result Date: 12/17/2022 CLINICAL DATA:  Wrist pain, swelling, redness. EXAM: LEFT WRIST - COMPLETE 3 VIEW COMPARISON:  None Available. FINDINGS: Soft tissue swelling is present over the dorsum of the wrist and hand. No radiopaque foreign body is present. No acute or focal osseous abnormality is present. IMPRESSION: Soft tissue swelling over the dorsum of the wrist and hand without underlying fracture or dislocation. Electronically Signed   By: Marin Roberts M.D.   On: 12/17/2022 13:07    Pertinent labs & imaging results that were available during my care of the patient were reviewed by me and considered in my medical decision making (see MDM for details).  Medications Ordered in ED Medications  fentaNYL (SUBLIMAZE) injection 50 mcg (50 mcg Intravenous Given 12/17/22  1250)  sodium chloride 0.9 % bolus 1,000 mL (0 mLs Intravenous Stopped 12/17/22 1620)  morphine (PF) 2 MG/ML injection 2 mg (2 mg Intravenous Given 12/17/22 1413)  ondansetron (ZOFRAN) injection 4 mg (4 mg Intravenous Given 12/17/22  1413)  cefTRIAXone (ROCEPHIN) 1 g in sodium chloride 0.9 % 100 mL IVPB (0 g Intravenous Stopped 12/17/22 1502)  HYDROmorphone (DILAUDID) injection 1 mg (1 mg Intravenous Given 12/17/22 2041)  ondansetron (ZOFRAN) injection 4 mg (4 mg Intravenous Given 12/17/22 2040)                                                                                                                                     Procedures Procedures  (including critical care time)  Medical Decision Making / ED Course    Medical Decision Making:    Byren Ast is a 67 y.o. male with past medical history as below, significant for HFrEF ef 20-25%, dilated cardiomyopathy, polysubstance abuse, CKD, arthritis, HLD, HTN who presents to the ED with complaint of left forearm cellulitis/rash/pain.. The complaint involves an extensive differential diagnosis and also carries with it a high risk of complications and morbidity.  Serious etiology was considered. Ddx includes but is not limited to: Cellulitis, osteomyelitis, superficial infection, allergy, etc.  Complete initial physical exam performed, notably the patient  was feeling a pain to his left wrist, otherwise he is not currently having any chest pain.  No dyspnea.  NAD.    Reviewed and confirmed nursing documentation for past medical history, family history, social history.  Vital signs reviewed.    Clinical Course as of 12/17/22 2134  Sat Dec 17, 2022  1628 Creatinine(!): 1.91 Similar to baseline  [SG]  1628 WBC: 6.2 [SG]  1628 Temp: 97.7 F (36.5 C) [SG]  1628 Pulse Rate: 91 He has tachycardia, o/w no other SIRS criteria, this may be 2/2 pain [SG]  1915 Troponin I (High Sensitivity)(!): 19 Downtrending [SG]  1915 Creatinine(!): 1.91 Improved  from baseline  [SG]  2048 B Natriuretic Peptide(!): >4,500.0 Chronically elevated BNP, he has no respiratory complaints, CXR w/o sig edema or effusion  [SG]  2056 Pt having some mild improvement to his pain after further analgesia  [SG]    Clinical Course User Index [SG] Sloan Leiter, DO   Was admitted and April of this month and discharged 5/5 following fall, weakness, dehydration and AKI.  Fortunately on recheck he is feeling much better. He is afebrile, no resp difficulty.   Concern for cellulitis to his left forearm, low suspicion for osteomyelitis or septic joint.   He is no longer having any chest pain, no chest pain while in the ED. His trop has downtrended and is improved from his baseline. His BNP is elevated but no acute respiratory complaints. Does have some leg swelling but is similar to his baseline per pt.  Recommend he follow up with cardiology as outpatient, he has hx HFrEF. Reports he has been compliant with his medications. EKG is stable No longer using cocaine or illicit drugs, no longer drinking etoh per pt Not septic, pain well controlled, able to tolerate PO w/o difficulty. Observation was offered but he prefers  to go home at this time Reasonable to trial o/p abx to treat cellulitis at this time, advised pt upon strict return precautions. F/u with cardiologist Pt agreeable to plan for discharge at this time.   The patient improved significantly and was discharged in stable condition. Detailed discussions were had with the patient regarding current findings, and need for close f/u with PCP or on call doctor. The patient has been instructed to return immediately if the symptoms worsen in any way for re-evaluation. Patient verbalized understanding and is in agreement with current care plan. All questions answered prior to discharge.             Additional history obtained: -Additional history obtained from na -External records from outside source obtained and  reviewed including: Chart review including previous notes, labs, imaging, consultation notes including prior admission, home meds, prior labs/imaging    Lab Tests: -I ordered, reviewed, and interpreted labs.   The pertinent results include:   Labs Reviewed  CBC WITH DIFFERENTIAL/PLATELET - Abnormal; Notable for the following components:      Result Value   RBC 4.10 (*)    MCV 106.1 (*)    All other components within normal limits  BASIC METABOLIC PANEL - Abnormal; Notable for the following components:   CO2 20 (*)    Glucose, Bld 117 (*)    Creatinine, Ser 1.91 (*)    GFR, Estimated 38 (*)    All other components within normal limits  BRAIN NATRIURETIC PEPTIDE - Abnormal; Notable for the following components:   B Natriuretic Peptide >4,500.0 (*)    All other components within normal limits  TROPONIN I (HIGH SENSITIVITY) - Abnormal; Notable for the following components:   Troponin I (High Sensitivity) 25 (*)    All other components within normal limits  TROPONIN I (HIGH SENSITIVITY) - Abnormal; Notable for the following components:   Troponin I (High Sensitivity) 19 (*)    All other components within normal limits  CULTURE, BLOOD (SINGLE)    Notable for BNP +, trop +, delta improved   EKG   EKG Interpretation  Date/Time:  Saturday December 17 2022 14:05:28 EDT Ventricular Rate:  99 PR Interval:  183 QRS Duration: 203 QT Interval:  410 QTC Calculation: 527 R Axis:   247 Text Interpretation: Sinus rhythm Probable left atrial enlargement Right bundle branch block similar to prior no stemi Confirmed by Tanda Rockers ([id number]) on 12/17/2022 9:01:34 PM         Imaging Studies ordered: I ordered imaging studies including CXR, wrist XR I independently visualized the following imaging with scope of interpretation limited to determining acute life threatening conditions related to emergency care; findings noted above, significant for soft tissue swelling on forearm xr, CXR stable  I  independently visualized and interpreted imaging. I agree with the radiologist interpretation   Medicines ordered and prescription drug management: Meds ordered this encounter  Medications   fentaNYL (SUBLIMAZE) injection 50 mcg   sodium chloride 0.9 % bolus 1,000 mL   morphine (PF) 2 MG/ML injection 2 mg   ondansetron (ZOFRAN) injection 4 mg   cefTRIAXone (ROCEPHIN) 1 g in sodium chloride 0.9 % 100 mL IVPB    Order Specific Question:   Antibiotic Indication:    Answer:   Cellulitis   HYDROmorphone (DILAUDID) injection 1 mg   ondansetron (ZOFRAN) injection 4 mg   cefUROXime (CEFTIN) 500 MG tablet    Sig: Take 1 tablet (500 mg total) by mouth 2 (two) times daily with  a meal for 7 days.    Dispense:  14 tablet    Refill:  0   oxyCODONE (ROXICODONE) 5 MG immediate release tablet    Sig: Take 1 tablet (5 mg total) by mouth every 6 (six) hours as needed for severe pain.    Dispense:  10 tablet    Refill:  0   acetaminophen (TYLENOL) 325 MG tablet    Sig: Take 2 tablets (650 mg total) by mouth every 6 (six) hours as needed.    Dispense:  36 tablet    Refill:  0    -I have reviewed the patients home medicines and have made adjustments as needed   Consultations Obtained: na   Cardiac Monitoring: The patient was maintained on a cardiac monitor.  I personally viewed and interpreted the cardiac monitored which showed an underlying rhythm of: NSR  Social Determinants of Health:  Diagnosis or treatment significantly limited by social determinants of health: former smoker prior polysub abuse   Reevaluation: After the interventions noted above, I reevaluated the patient and found that they have improved  Co morbidities that complicate the patient evaluation  Past Medical History:  Diagnosis Date   Anxiety    Arthritis    "all over" (01/07/2015)   CKD (chronic kidney disease), stage IV (HCC)    Depression    Headache    "probably weekly" (01/07/2015)   History of echocardiogram  12/2014   EF 20-25% -> improved up to EF 35 and 40% February 2020:   Homelessness    Hypercholesterolemia    Hypertension    Ischemic dilated cardiomyopathy (HCC)    Noncompliance    Nonischemic dilated cardiomyopathy (HCC) 07/2014   Initial EF was 2025% with global hypokinesis.  As of February 2020 EF up to 35-40%, GRII DD.  Global hypokinesis.  No WMA.  Relatively normal valves.   NSTEMI (non-ST elevated myocardial infarction) [facility]) 2016   2 separate episodes in January and June.  Normal coronary arteries by cardiac catheterization.  Thought to be related to.   Polysubstance abuse (HCC)    etoh, cocaine   Urinary hesitancy    Walking pneumonia 07/2014      Dispostion: Disposition decision including need for hospitalization was considered, and patient discharged from emergency department.    Final Clinical Impression(s) / ED Diagnoses Final diagnoses:  Cellulitis of left upper extremity     This chart was dictated using voice recognition software.  Despite best efforts to proofread,  errors can occur which can change the documentation meaning.    Sloan Leiter, DO 12/17/22 2134

## 2022-12-18 LAB — CULTURE, BLOOD (SINGLE)

## 2022-12-22 ENCOUNTER — Emergency Department (HOSPITAL_COMMUNITY): Payer: 59

## 2022-12-22 ENCOUNTER — Encounter (HOSPITAL_COMMUNITY): Payer: Self-pay

## 2022-12-22 ENCOUNTER — Inpatient Hospital Stay (HOSPITAL_COMMUNITY)
Admission: EM | Admit: 2022-12-22 | Discharge: 2022-12-28 | DRG: 595 | Disposition: A | Discharge status: Other Acute Inpatient Hospital | Payer: 59 | Attending: Internal Medicine | Admitting: Internal Medicine

## 2022-12-22 ENCOUNTER — Other Ambulatory Visit: Payer: Self-pay

## 2022-12-22 DIAGNOSIS — Z91048 Other nonmedicinal substance allergy status: Secondary | ICD-10-CM

## 2022-12-22 DIAGNOSIS — I255 Ischemic cardiomyopathy: Secondary | ICD-10-CM | POA: Diagnosis present

## 2022-12-22 DIAGNOSIS — Z1152 Encounter for screening for COVID-19: Secondary | ICD-10-CM

## 2022-12-22 DIAGNOSIS — Z91199 Patient's noncompliance with other medical treatment and regimen due to unspecified reason: Secondary | ICD-10-CM

## 2022-12-22 DIAGNOSIS — I130 Hypertensive heart and chronic kidney disease with heart failure and stage 1 through stage 4 chronic kidney disease, or unspecified chronic kidney disease: Secondary | ICD-10-CM | POA: Diagnosis present

## 2022-12-22 DIAGNOSIS — F1010 Alcohol abuse, uncomplicated: Secondary | ICD-10-CM | POA: Diagnosis present

## 2022-12-22 DIAGNOSIS — I340 Nonrheumatic mitral (valve) insufficiency: Secondary | ICD-10-CM | POA: Diagnosis present

## 2022-12-22 DIAGNOSIS — Z881 Allergy status to other antibiotic agents status: Secondary | ICD-10-CM

## 2022-12-22 DIAGNOSIS — E7800 Pure hypercholesterolemia, unspecified: Secondary | ICD-10-CM | POA: Diagnosis present

## 2022-12-22 DIAGNOSIS — L513 Stevens-Johnson syndrome-toxic epidermal necrolysis overlap syndrome: Secondary | ICD-10-CM | POA: Diagnosis not present

## 2022-12-22 DIAGNOSIS — I5043 Acute on chronic combined systolic (congestive) and diastolic (congestive) heart failure: Secondary | ICD-10-CM | POA: Diagnosis not present

## 2022-12-22 DIAGNOSIS — I420 Dilated cardiomyopathy: Secondary | ICD-10-CM | POA: Diagnosis present

## 2022-12-22 DIAGNOSIS — Z79899 Other long term (current) drug therapy: Secondary | ICD-10-CM

## 2022-12-22 DIAGNOSIS — F419 Anxiety disorder, unspecified: Secondary | ICD-10-CM | POA: Diagnosis present

## 2022-12-22 DIAGNOSIS — E669 Obesity, unspecified: Secondary | ICD-10-CM | POA: Diagnosis present

## 2022-12-22 DIAGNOSIS — Z6828 Body mass index (BMI) 28.0-28.9, adult: Secondary | ICD-10-CM

## 2022-12-22 DIAGNOSIS — N179 Acute kidney failure, unspecified: Secondary | ICD-10-CM | POA: Diagnosis present

## 2022-12-22 DIAGNOSIS — I513 Intracardiac thrombosis, not elsewhere classified: Secondary | ICD-10-CM | POA: Diagnosis present

## 2022-12-22 DIAGNOSIS — Z87891 Personal history of nicotine dependence: Secondary | ICD-10-CM

## 2022-12-22 DIAGNOSIS — I5042 Chronic combined systolic (congestive) and diastolic (congestive) heart failure: Secondary | ICD-10-CM | POA: Diagnosis present

## 2022-12-22 DIAGNOSIS — I252 Old myocardial infarction: Secondary | ICD-10-CM

## 2022-12-22 DIAGNOSIS — N184 Chronic kidney disease, stage 4 (severe): Secondary | ICD-10-CM | POA: Diagnosis present

## 2022-12-22 DIAGNOSIS — L03114 Cellulitis of left upper limb: Secondary | ICD-10-CM | POA: Diagnosis not present

## 2022-12-22 DIAGNOSIS — N183 Chronic kidney disease, stage 3 unspecified: Secondary | ICD-10-CM | POA: Diagnosis present

## 2022-12-22 DIAGNOSIS — F32A Depression, unspecified: Secondary | ICD-10-CM | POA: Diagnosis present

## 2022-12-22 DIAGNOSIS — I2510 Atherosclerotic heart disease of native coronary artery without angina pectoris: Secondary | ICD-10-CM | POA: Diagnosis present

## 2022-12-22 DIAGNOSIS — F1410 Cocaine abuse, uncomplicated: Secondary | ICD-10-CM | POA: Diagnosis present

## 2022-12-22 DIAGNOSIS — Z7901 Long term (current) use of anticoagulants: Secondary | ICD-10-CM

## 2022-12-22 LAB — CBC WITH DIFFERENTIAL/PLATELET
Abs Immature Granulocytes: 0.04 10*3/uL (ref 0.00–0.07)
Basophils Absolute: 0 10*3/uL (ref 0.0–0.1)
Basophils Relative: 1 %
Eosinophils Absolute: 0.6 10*3/uL — ABNORMAL HIGH (ref 0.0–0.5)
Eosinophils Relative: 12 %
HCT: 42.1 % (ref 39.0–52.0)
Hemoglobin: 13.3 g/dL (ref 13.0–17.0)
Immature Granulocytes: 1 %
Lymphocytes Relative: 23 %
Lymphs Abs: 1.1 10*3/uL (ref 0.7–4.0)
MCH: 33.8 pg (ref 26.0–34.0)
MCHC: 31.6 g/dL (ref 30.0–36.0)
MCV: 106.9 fL — ABNORMAL HIGH (ref 80.0–100.0)
Monocytes Absolute: 0.5 10*3/uL (ref 0.1–1.0)
Monocytes Relative: 10 %
Neutro Abs: 2.7 10*3/uL (ref 1.7–7.7)
Neutrophils Relative %: 53 %
Platelets: 211 10*3/uL (ref 150–400)
RBC: 3.94 MIL/uL — ABNORMAL LOW (ref 4.22–5.81)
RDW: 13.4 % (ref 11.5–15.5)
WBC: 5 10*3/uL (ref 4.0–10.5)
nRBC: 0 % (ref 0.0–0.2)

## 2022-12-22 LAB — COMPREHENSIVE METABOLIC PANEL
ALT: 19 U/L (ref 0–44)
AST: 21 U/L (ref 15–41)
Albumin: 3.8 g/dL (ref 3.5–5.0)
Alkaline Phosphatase: 86 U/L (ref 38–126)
Anion gap: 12 (ref 5–15)
BUN: 21 mg/dL (ref 8–23)
CO2: 20 mmol/L — ABNORMAL LOW (ref 22–32)
Calcium: 9.5 mg/dL (ref 8.9–10.3)
Chloride: 107 mmol/L (ref 98–111)
Creatinine, Ser: 1.71 mg/dL — ABNORMAL HIGH (ref 0.61–1.24)
GFR, Estimated: 43 mL/min — ABNORMAL LOW (ref >60)
Glucose, Bld: 79 mg/dL (ref 70–99)
Potassium: 4.8 mmol/L (ref 3.5–5.1)
Sodium: 139 mmol/L (ref 135–145)
Total Bilirubin: 0.8 mg/dL (ref 0.3–1.2)
Total Protein: 6.9 g/dL (ref 6.5–8.1)

## 2022-12-22 LAB — CULTURE, BLOOD (SINGLE): Culture: NO GROWTH

## 2022-12-22 LAB — LACTIC ACID, PLASMA: Lactic Acid, Venous: 1.4 mmol/L (ref 0.5–1.9)

## 2022-12-22 MED ORDER — SODIUM CHLORIDE 0.9 % IV BOLUS: 1000.0000 mL | Freq: Once | INTRAVENOUS | Status: DC

## 2022-12-22 MED ORDER — SODIUM CHLORIDE 0.9 % IV SOLN: 1.0000 g | INTRAVENOUS | Status: DC

## 2022-12-22 MED ORDER — CARVEDILOL 6.25 MG PO TABS
6.2500 mg | ORAL_TABLET | Freq: Two times a day (BID) | ORAL | Status: DC
  Administered 2022-12-22 – 2022-12-26 (×8): 6.25 mg via ORAL
  Filled 2022-12-22 (×5): qty 1
  Filled 2022-12-22: qty 2
  Filled 2022-12-22 (×2): qty 1

## 2022-12-22 MED ORDER — FUROSEMIDE 10 MG/ML IJ SOLN
40.0000 mg | Freq: Two times a day (BID) | INTRAMUSCULAR | Status: DC
  Administered 2022-12-22 – 2022-12-25 (×6): 40 mg via INTRAVENOUS
  Filled 2022-12-22 (×6): qty 4

## 2022-12-22 MED ORDER — APIXABAN 5 MG PO TABS
5.0000 mg | ORAL_TABLET | Freq: Two times a day (BID) | ORAL | Status: DC
  Administered 2022-12-22 – 2022-12-27 (×11): 5 mg via ORAL
  Filled 2022-12-22 (×11): qty 1

## 2022-12-22 MED ORDER — SODIUM CHLORIDE 0.9 % IV SOLN
2.0000 g | Freq: Once | INTRAVENOUS | Status: AC
  Administered 2022-12-22: 2 g via INTRAVENOUS
  Filled 2022-12-22: qty 20

## 2022-12-22 MED ORDER — VANCOMYCIN HCL 1750 MG/350ML IV SOLN
1750.0000 mg | Freq: Once | INTRAVENOUS | Status: AC
  Administered 2022-12-22: 1750 mg via INTRAVENOUS
  Filled 2022-12-22: qty 350

## 2022-12-22 MED ORDER — ATORVASTATIN CALCIUM 40 MG PO TABS
40.0000 mg | ORAL_TABLET | Freq: Every day | ORAL | Status: DC
  Administered 2022-12-22 – 2022-12-27 (×6): 40 mg via ORAL
  Filled 2022-12-22 (×6): qty 1

## 2022-12-22 MED ORDER — TAMSULOSIN HCL 0.4 MG PO CAPS
0.4000 mg | ORAL_CAPSULE | Freq: Every day | ORAL | Status: DC
  Administered 2022-12-23 – 2022-12-27 (×5): 0.4 mg via ORAL
  Filled 2022-12-22 (×5): qty 1

## 2022-12-22 MED ORDER — SODIUM CHLORIDE 0.9 % IV BOLUS
500.0000 mL | Freq: Once | INTRAVENOUS | Status: AC
  Administered 2022-12-22: 500 mL via INTRAVENOUS

## 2022-12-22 NOTE — ED Notes (Signed)
Sandwich, applesauce and cranberry juice provided at this time. Pt states no further needs.

## 2022-12-22 NOTE — ED Notes (Signed)
Patient starting to develop hives to R forearm at this time. Vanco paused. Provider notified.

## 2022-12-22 NOTE — ED Notes (Signed)
Pt ambulatory with steady gait to bathroom at this time.  Currently c/o itching to L forearm. Arm appears slightly reddened, more than previously. No other redness/rash to indicate red man syndrome - vanco infusion continues at this time. Provider notified.

## 2022-12-22 NOTE — H&P (Addendum)
[facility] History and Physical  Wynn Pociask ZOX:[id number] DOB: [birth year] DOA: 12/22/2022 PCP: Raymon Mutton., FNP  Admitted from: Home Chief Complaint: Left hand swelling  History of Present Illness: [name] is a 67 y.o. male with PMH significant for HTN, HLD, CAD, NSTEMI, nonischemic dilated cardiomyopathy, chronic systolic CHF (EF 20 to 25%), CKD 4, polysubstance abuse (cocaine, alcohol), anxiety, depression. 6/1, patient was seen in the ED with complaint of pain, redness and swelling of right hand for 2 days.  X-ray showed soft tissue swelling without osseous abnormality.  Patient was given 1 dose of IV Rocephin and discharged on oral cefuroxime and oxycodone Today 6/6, patient returned back to the ED with complaint of worsening redness, and pain of left hand despite compliance to antibiotics and pain meds.  In the ED, patient was afebrile, heart rate 109, blood pressure 159/103, breathing on room air Labs with WC count 5, hemoglobin 13.3, platelet 211, lactic acid 1.4, BUN/creatinine 21/1.71 Left hand x-ray showed marked soft tissue swelling of the hand and wrist, increased from prior without acute osseous abnormality. Blood culture sent. Patient was given IV Rocephin, IV vancomycin Hospitalist service was consulted for inpatient admission and management.   At the time of my evaluation, patient was lying on bed.  Alert, awake, oriented x 3.  No family at bedside. Left hand and forearm warm and swollen.  Unable to see redness due to dark complexion.  Left wrist ROM decreased because of swelling but no significant pain on movement Also has 1-2+ bilateral lower extremity edema Last cocaine use 2 weeks ago.  She he drinks occasionally, last drank a beer 2 nights ago. Lives alone at home.  Ambulates independently Reports compliance to CHF regimen.   Review of Systems:  All systems were reviewed and were negative unless otherwise mentioned in the HPI   Past  medical history: Past Medical History:  Diagnosis Date   Anxiety    Arthritis    "all over" (01/07/2015)   CKD (chronic kidney disease), stage IV (HCC)    Depression    Headache    "probably weekly" (01/07/2015)   History of echocardiogram 12/2014   EF 20-25% -> improved up to EF 35 and 40% February 2020:   Homelessness    Hypercholesterolemia    Hypertension    Ischemic dilated cardiomyopathy (HCC)    Noncompliance    Nonischemic dilated cardiomyopathy (HCC) 07/2014   Initial EF was 2025% with global hypokinesis.  As of February 2020 EF up to 35-40%, GRII DD.  Global hypokinesis.  No WMA.  Relatively normal valves.   NSTEMI (non-ST elevated myocardial infarction) [facility]) 2016   2 separate episodes in January and June.  Normal coronary arteries by cardiac catheterization.  Thought to be related to.   Polysubstance abuse (HCC)    etoh, cocaine   Urinary hesitancy    Walking pneumonia 07/2014    Past surgical history: Past Surgical History:  Procedure Laterality Date   LEFT HEART CATHETERIZATION WITH CORONARY ANGIOGRAM N/A 08/15/2014   Procedure: LEFT HEART CATHETERIZATION WITH CORONARY ANGIOGRAM;  Surgeon: Robynn Pane, MD;  Location: [facility] CATH LAB;  Service: Cardiovascular;; normal coronary arteries.  Severely elevated LVEDP of 35 mmHg.   TRANSTHORACIC ECHOCARDIOGRAM  08/2018   EF 35-40% (up from 20-25% in 2017).  No MR WMA.  Global HK.  GRII DD.  Normal valves.    Social History:  reports that he quit smoking about 18 years ago. His smoking use included cigarettes. He  has a 45.00 pack-year smoking history. He has quit using smokeless tobacco. He reports current alcohol use of about 2.0 - 4.0 standard drinks of alcohol per week. He reports current drug use. Drug: "Crack" cocaine.  Allergies:  Allergies  Allergen Reactions   Tape Rash    Please use paper tape   Tape   Family history:  History reviewed. No pertinent family history.   Home Meds: Prior to Admission  medications   Medication Sig Start Date End Date Taking? Authorizing Provider  acetaminophen (TYLENOL) 325 MG tablet Take 2 tablets (650 mg total) by mouth every 6 (six) hours as needed. 12/17/22   Sloan Leiter, DO  albuterol (VENTOLIN HFA) 108 (90 Base) MCG/ACT inhaler Inhale 2 puffs into the lungs every 4 (four) hours as needed for wheezing or shortness of breath. 08/31/22   Horton, Mayer Masker, MD  apixaban (ELIQUIS) 5 MG TABS tablet Take 1 tablet (5 mg total) by mouth 2 (two) times daily. 12/07/22   Andrey Farmer, PA-C  atorvastatin (LIPITOR) 40 MG tablet Take 40 mg by mouth at bedtime. 08/25/22   [provider]  carvedilol (COREG) 6.25 MG tablet Take 1 tablet (6.25 mg total) by mouth 2 (two) times daily. 12/07/22   Andrey Farmer, PA-C  cefUROXime (CEFTIN) 500 MG tablet Take 1 tablet (500 mg total) by mouth 2 (two) times daily with a meal for 7 days. 12/17/22 12/24/22  Sloan Leiter, DO  clotrimazole-betamethasone (LOTRISONE) cream Apply 1 application topically 2 (two) times daily. 02/04/19   Hoy Register, MD  dapagliflozin propanediol (FARXIGA) 10 MG TABS tablet Take 1 tablet (10 mg total) by mouth daily before breakfast. 12/07/22   Andrey Farmer, PA-C  famotidine (PEPCID) 20 MG tablet Take 20 mg by mouth 2 (two) times daily. 11/08/22   [provider]  furosemide (LASIX) 80 MG tablet Take 0.5 tablets (40 mg total) by mouth 2 (two) times daily. 12/07/22   Andrey Farmer, PA-C  gabapentin (NEURONTIN) 600 MG tablet Take 600 mg by mouth 2 (two) times daily. 11/07/22   [provider]  Misc. Devices MISC Blood pressure monitor Dx: Hypertension, CHF 11/01/18   Newlin, Enobong, MD  montelukast (SINGULAIR) 10 MG tablet Take 10 mg by mouth at bedtime. 08/27/22   [provider]  nitroGLYCERIN (NITROSTAT) 0.4 MG SL tablet Place 1 tablet (0.4 mg total) under the tongue every 5 (five) minutes x 3 doses as needed for chest pain. 01/06/17   Hoy Register, MD  oxyCODONE (ROXICODONE) 5 MG immediate release tablet Take 1 tablet (5 mg total) by mouth every 6 (six) hours as needed for severe pain. 12/17/22   Sloan Leiter, DO  potassium chloride SA (K-DUR,KLOR-CON) 20 MEQ tablet Take 20 mEq by mouth daily. 06/04/16   [provider]  sacubitril-valsartan (ENTRESTO) 49-51 MG Take 1 tablet by mouth 2 (two) times daily.    [provider]  tamsulosin (FLOMAX) 0.4 MG CAPS capsule Take 0.4 mg by mouth daily. 11/07/22   [provider]  tiZANidine (ZANAFLEX) 4 MG tablet Take 1 tablet (4 mg total) by mouth every 6 (six) hours as needed for muscle spasms. MUST MAKE APPT FOR FURTHER REFILLS 02/22/19   Hoy Register, MD    Physical Exam: Vitals:   12/22/22 1811 12/22/22 1820 12/22/22 2055 12/22/22 2215  BP: (!) 159/103     Pulse: (!) 109   (!) 101  Resp:   (!) 21   Temp: 98.2 F (36.8  C)     TempSrc: Oral     SpO2: 98%   97%  Weight:  86.2 kg    Height:  5\' 8"  (1.727 m)     Wt Readings from Last 3 Encounters:  12/22/22 86.2 kg  12/17/22 87.1 kg  12/07/22 87.1 kg   Body mass index is 28.89 kg/m.  General exam: Pleasant elderly African-American male. Skin: No rashes, lesions or ulcers. HEENT: Atraumatic, normocephalic, no obvious bleeding Lungs: Clear to auscultation bilaterally CVS: Regular rate and rhythm, no murmur GI/Abd soft, nontender, nondistended, bowel sound present CNS: Alert, awake, oriented x 3 Psychiatry: Mood appropriate Extremities: Left upper extremity swelling in the hand and forearm.  Range of movement limited by swelling but no joint pain.  Bilateral lower extremity 1+ to 2+ pedal edema.  No calf tenderness   ------------------------------------------------------------------------------------------------------ Assessment/Plan: Principal Problem:   Cellulitis of left hand Active Problems:   CKD (chronic kidney disease) stage 3, GFR 30-59 ml/min (HCC)   Chronic combined systolic and  diastolic congestive heart failure (HCC)  Left hand cellulitis Ongoing redness, pain of left hand for last more than a week not responding to outpatient oral antibiotics Not septic X-ray without evidence of abscess or bone involvement Has been started on IV Rocephin and IV vancomycin No history of MRSA.  Given poor kidney infection, I would avoid vancomycin at this time.  Continue IV Rocephin only. Continue to monitor Pain control with Percocet. Also need to rule out left lower extremity DVT.  Obtain ultrasound duplex. Recent Labs  Lab 12/17/22 1253 12/22/22 2027 12/22/22 2028  WBC 6.2 5.0  --   LATICACIDVEN  --   --  1.4   Nonischemic cardiomyopathy Chronic systolic CHF RV thrombus During his most recent hospitalization 4/30 to 5/5, echo showed EF of 2025%, RV thrombus, severe MR, severe RV function impairment.  He was diuresed and discharged on CHF regimen. Per cardiology office note 5/22, CHF regimen as below: Coreg 6.25 mg twice daily, Entresto 49-51 mg twice daily, Lasix 40 mg twice daily, Farxiga 10 mg daily Given his significant bilateral lower extremity edema, I doubt compliance. Resume Coreg 6.25 mg twice daily.  Start on Lasix IV 40 mg twice daily. Resume Eliquis for LV thrombus Monitor hemodynamics, renal function and electrolytes and gradually resume other chest meds..  CKD 4 Which is baseline creatinine is 2-2.5. Creatinine better than baseline range currently.  Continue to monitor. Recent Labs    10/06/22 1040 11/15/22 2358 11/16/22 1016 11/17/22 0412 11/18/22 0103 11/19/22 0201 11/20/22 0017 12/07/22 1238 12/17/22 1253 12/22/22 2027  BUN 26* 55* 48* 50* 47* 40* 38* 29* 19 21  CREATININE 2.30* 3.07* 2.86* 2.53* 2.39* 2.24* 2.30* 2.21* 1.91* 1.71*   HLD Lipitor  polysubstance abuse (cocaine, alcohol) Last cocaine use 2 weeks ago.  She he drinks occasionally, last drank a beer 2 nights ago. counseled to quit  anxiety, depression   Mobility:  Encourage ambulation  Goals of care   Code Status: Full Code    DVT prophylaxis:   apixaban (ELIQUIS) tablet 5 mg   Antimicrobials: IV Rocephin Fluid: None Consultants: None Family Communication: None at bedside  Dispo: The patient is from: Home, lives alone              Anticipated d/c is to:  Pending clinical course  Diet: Diet Order     None       ------------------------------------------------------------------------------------- Severity of Illness: The appropriate patient status for this patient is OBSERVATION. Observation status is  judged to be reasonable and necessary in order to provide the required intensity of service to ensure the patient's safety. The patient's presenting symptoms, physical exam findings, and initial radiographic and laboratory data in the context of their medical condition is felt to place them at decreased risk for further clinical deterioration. Furthermore, it is anticipated that the patient will be medically stable for discharge from the hospital within 2 midnights of admission.  -------------------------------------------------------------------------------------   Labs on Admission:   CBC: Recent Labs  Lab 12/17/22 1253 12/22/22 2027  WBC 6.2 5.0  NEUTROABS 3.9 2.7  HGB 13.8 13.3  HCT 43.5 42.1  MCV 106.1* 106.9*  PLT 183 211    Basic Metabolic Panel: Recent Labs  Lab 12/17/22 1253 12/22/22 2027  NA 137 139  K 4.0 4.8  CL 102 107  CO2 20* 20*  GLUCOSE 117* 79  BUN 19 21  CREATININE 1.91* 1.71*  CALCIUM 9.5 9.5    Liver Function Tests: Recent Labs  Lab 12/22/22 2027  AST 21  ALT 19  ALKPHOS 86  BILITOT 0.8  PROT 6.9  ALBUMIN 3.8   No results for input(s): "LIPASE", "AMYLASE" in the last 168 hours. No results for input(s): "AMMONIA" in the last 168 hours.  Cardiac Enzymes: No results for input(s): "CKTOTAL", "CKMB", "CKMBINDEX", "TROPONINI" in the last 168 hours.  BNP (last 3 results) Recent Labs     11/16/22 1016 12/07/22 1238 12/17/22 1300  BNP 4,270.9* 2,959.2* >4,500.0*    ProBNP (last 3 results) No results for input(s): "PROBNP" in the last 8760 hours.  CBG: No results for input(s): "GLUCAP" in the last 168 hours.  Lipase     Component Value Date/Time   LIPASE 23 10/06/2022 1040     Urinalysis    Component Value Date/Time   COLORURINE YELLOW 11/16/2022 0125   APPEARANCEUR CLEAR 11/16/2022 0125   LABSPEC 1.009 11/16/2022 0125   PHURINE 5.0 11/16/2022 0125   GLUCOSEU NEGATIVE 11/16/2022 0125   HGBUR NEGATIVE 11/16/2022 0125   BILIRUBINUR NEGATIVE 11/16/2022 0125   KETONESUR NEGATIVE 11/16/2022 0125   PROTEINUR 100 (A) 11/16/2022 0125   UROBILINOGEN 1.0 05/13/2015 1854   NITRITE NEGATIVE 11/16/2022 0125   LEUKOCYTESUR NEGATIVE 11/16/2022 0125     Drugs of Abuse     Component Value Date/Time   LABOPIA NONE DETECTED 11/16/2022 0125   COCAINSCRNUR POSITIVE (A) 11/16/2022 0125   LABBENZ NONE DETECTED 11/16/2022 0125   AMPHETMU NONE DETECTED 11/16/2022 0125   THCU NONE DETECTED 11/16/2022 0125   LABBARB NONE DETECTED 11/16/2022 0125      Radiological Exams on Admission: DG Hand Complete Left  Result Date: 12/22/2022 CLINICAL DATA:  Swelling EXAM: LEFT HAND - COMPLETE 3+ VIEW COMPARISON:  Left wrist x-ray 12/20/2022 FINDINGS: There is marked soft tissue swelling of the hand and wrist, increased from prior. There is no radiopaque foreign body. There is no cortical erosion. There is no acute fracture or dislocation. IMPRESSION: Marked soft tissue swelling of the hand and wrist, increased from prior. No acute osseous abnormality. Electronically Signed   By: Darliss Cheney M.D.   On: 12/22/2022 19:58     Signed, Lorin Glass, MD [facility] 12/22/2022

## 2022-12-22 NOTE — Progress Notes (Signed)
A consult was received from an ED physician for vancomycin per pharmacy dosing.  The patient's profile has been reviewed for ht/wt/allergies/indication/available labs.    Allergies  Allergen Reactions   Tape Rash    Please use paper tape    A one time order has been placed for vancomycin 1750 mg IV once.  Further antibiotics/pharmacy consults should be ordered by admitting physician if indicated.                       Thank you, Cindi Carbon, PharmD 12/22/2022  7:41 PM

## 2022-12-22 NOTE — ED Provider Notes (Signed)
[place] EMERGENCY DEPARTMENT AT [facility] Provider Note  CSN: [id number] Arrival date & time: 12/22/22 1805  Chief Complaint(s) Hand Pain  HPI [name] is a 67 y.o. male history of CKD, CHF, polysubstance abuse, coronary artery disease, presenting to the emergency department with left hand swelling.  Patient was seen at [facility] [place] around 5 days ago.  He reports that he was having swelling in his hand and received antibiotics.  He reports he has been compliant.  He reports that he is continue have swelling which is worsening.  No fevers or chills.  He is able to move his wrist and fingers but somewhat limited due to swelling.  Denies similar episode in the past.  Denies trauma.  Denies any animal bites or wounds.   Past Medical History Past Medical History:  Diagnosis Date   Anxiety    Arthritis    "all over" (01/07/2015)   CKD (chronic kidney disease), stage IV (HCC)    Depression    Headache    "probably weekly" (01/07/2015)   History of echocardiogram 12/2014   EF 20-25% -> improved up to EF 35 and 40% February 2020:   Homelessness    Hypercholesterolemia    Hypertension    Ischemic dilated cardiomyopathy (HCC)    Noncompliance    Nonischemic dilated cardiomyopathy (HCC) 07/2014   Initial EF was 2025% with global hypokinesis.  As of February 2020 EF up to 35-40%, GRII DD.  Global hypokinesis.  No WMA.  Relatively normal valves.   NSTEMI (non-ST elevated myocardial infarction) [facility]) 2016   2 separate episodes in January and June.  Normal coronary arteries by cardiac catheterization.  Thought to be related to.   Polysubstance abuse (HCC)    etoh, cocaine   Urinary hesitancy    Walking pneumonia 07/2014   Patient Active Problem List   Diagnosis Date Noted   Cellulitis of left hand 12/22/2022   Acute kidney injury superimposed on chronic kidney disease (HCC) 11/16/2022   Acute metabolic encephalopathy 11/16/2022   Stroke-like symptoms 11/16/2022   Bradycardia  11/16/2022   Cocaine abuse (HCC) 11/16/2022   Non-ischemic cardiomyopathy (HCC) 11/21/2018   Syncope and collapse 09/06/2018   Neuropathy 03/03/2017   Lower urinary tract symptoms 01/06/2017   Acute on chronic systolic heart failure (HCC) 07/05/2016   Macrocytosis without anemia 07/05/2016   Hypotension 06/14/2016   Generalized anxiety disorder 06/13/2016   Dermatitis 01/27/2016   Troponin level elevated    CKD (chronic kidney disease) stage 3, GFR 30-59 ml/min (HCC) 05/03/2015   Chronic combined systolic and diastolic congestive heart failure (HCC) 05/03/2015   Benign essential HTN 05/03/2015   Polysubstance abuse (HCC)    Noncompliance    Gouty arthritis of left great toe 03/24/2015   Home Medication(s) Prior to Admission medications   Medication Sig Start Date End Date Taking? Authorizing Provider  acetaminophen (TYLENOL) 325 MG tablet Take 2 tablets (650 mg total) by mouth every 6 (six) hours as needed. Patient taking differently: Take 650 mg by mouth every 6 (six) hours as needed for moderate pain. 12/17/22  Yes Tanda Rockers A, DO  albuterol (VENTOLIN HFA) 108 (90 Base) MCG/ACT inhaler Inhale 2 puffs into the lungs every 4 (four) hours as needed for wheezing or shortness of breath. 08/31/22  Yes Horton, Mayer Masker, MD  apixaban (ELIQUIS) 5 MG TABS tablet Take 1 tablet (5 mg total) by mouth 2 (two) times daily. 12/07/22  Yes Andrey Farmer, PA-C  atorvastatin (LIPITOR) 40 MG tablet  Take 40 mg by mouth at bedtime. 08/25/22  Yes [provider]  carvedilol (COREG) 6.25 MG tablet Take 1 tablet (6.25 mg total) by mouth 2 (two) times daily. 12/07/22  Yes Andrey Farmer, PA-C  cefUROXime (CEFTIN) 500 MG tablet Take 1 tablet (500 mg total) by mouth 2 (two) times daily with a meal for 7 days. 12/17/22 12/24/22 Yes Sloan Leiter, DO  clotrimazole-betamethasone (LOTRISONE) cream Apply 1 application topically 2 (two) times daily. 02/04/19  Yes Hoy Register, MD  dapagliflozin  propanediol (FARXIGA) 10 MG TABS tablet Take 1 tablet (10 mg total) by mouth daily before breakfast. 12/07/22  Yes Andrey Farmer, PA-C  famotidine (PEPCID) 20 MG tablet Take 20 mg by mouth 2 (two) times daily. 11/08/22  Yes [provider]  furosemide (LASIX) 80 MG tablet Take 0.5 tablets (40 mg total) by mouth 2 (two) times daily. 12/07/22  Yes Andrey Farmer, PA-C  gabapentin (NEURONTIN) 600 MG tablet Take 600 mg by mouth 2 (two) times daily. 11/07/22  Yes [provider]  montelukast (SINGULAIR) 10 MG tablet Take 10 mg by mouth at bedtime. 08/27/22  Yes [provider]  nitroGLYCERIN (NITROSTAT) 0.4 MG SL tablet Place 1 tablet (0.4 mg total) under the tongue every 5 (five) minutes x 3 doses as needed for chest pain. 01/06/17  Yes Hoy Register, MD  oxyCODONE (ROXICODONE) 5 MG immediate release tablet Take 1 tablet (5 mg total) by mouth every 6 (six) hours as needed for severe pain. 12/17/22  Yes Tanda Rockers A, DO  potassium chloride SA (K-DUR,KLOR-CON) 20 MEQ tablet Take 20 mEq by mouth daily. 06/04/16  Yes [provider]  sacubitril-valsartan (ENTRESTO) 49-51 MG Take 1 tablet by mouth 2 (two) times daily.   Yes [provider]  tamsulosin (FLOMAX) 0.4 MG CAPS capsule Take 0.4 mg by mouth daily. 11/07/22  Yes [provider]  tiZANidine (ZANAFLEX) 4 MG tablet Take 1 tablet (4 mg total) by mouth every 6 (six) hours as needed for muscle spasms. MUST MAKE APPT FOR FURTHER REFILLS 02/22/19  Yes Hoy Register, MD  Misc. Devices MISC Blood pressure monitor Dx: Hypertension, CHF 11/01/18   Hoy Register, MD                                                                                                                                    Past Surgical History Past Surgical History:  Procedure Laterality Date   LEFT HEART CATHETERIZATION WITH CORONARY ANGIOGRAM N/A 08/15/2014   Procedure: LEFT HEART CATHETERIZATION WITH CORONARY ANGIOGRAM;   Surgeon: Robynn Pane, MD;  Location: [facility] CATH LAB;  Service: Cardiovascular;; normal coronary arteries.  Severely elevated LVEDP of 35 mmHg.   TRANSTHORACIC ECHOCARDIOGRAM  08/2018   EF 35-40% (up from 20-25% in 2017).  No MR WMA.  Global HK.  GRII DD.  Normal valves.   Family History History reviewed. No pertinent family history.  Social History Social History   Tobacco Use   Smoking status: Former    Packs/day: 1.50    Years: 30.00    Additional pack years: 0.00    Total pack years: 45.00    Types: Cigarettes    Quit date: 02/19/2004    Years since quitting: 18.8   Smokeless tobacco: Former   Tobacco comments:    quit 2005  Vaping Use   Vaping Use: Never used  Substance Use Topics   Alcohol use: Yes    Alcohol/week: 2.0 - 4.0 standard drinks of alcohol    Types: 2 - 4 Cans of beer per week    Comment: once weekly   Drug use: Yes    Types: "Crack" cocaine    Comment: 2-3 weeks ago   Allergies Tape  Review of Systems Review of Systems  All other systems reviewed and are negative.   Physical Exam Vital Signs  I have reviewed the triage vital signs BP 126/89 (BP Location: Right Arm)   Pulse 100   Temp 98.6 F (37 C)   Resp 16   Ht 5\' 8"  (1.727 m)   Wt 86.2 kg   SpO2 99%   BMI 28.89 kg/m  Physical Exam Vitals and nursing note reviewed.  Constitutional:      General: He is not in acute distress.    Appearance: Normal appearance.  HENT:     Mouth/Throat:     Mouth: Mucous membranes are moist.  Eyes:     Conjunctiva/sclera: Conjunctivae normal.  Cardiovascular:     Rate and Rhythm: Normal rate and regular rhythm.     Pulses:          Radial pulses are 2+ on the right side and 2+ on the left side.  Pulmonary:     Effort: Pulmonary effort is normal. No respiratory distress.     Breath sounds: Normal breath sounds.  Abdominal:     General: Abdomen is flat.     Palpations: Abdomen is soft.     Tenderness: There is no abdominal tenderness.   Musculoskeletal:     Right lower leg: No edema.     Left lower leg: No edema.     Comments: Significant swelling to the left hand extending up to the forearm but able to range at the left wrist and fingers of the left hand without significant pain.  Swelling primarily over the dorsum with no significant swelling to the palmar aspect of the hand or specific swelling to the fingers.  No tenderness over the ulnar hand.  Mild tenderness to the dorsal hand.  Skin:    General: Skin is warm and dry.     Capillary Refill: Capillary refill takes less than 2 seconds.     Comments: Erythema and warmth over the dorsal hand on the left extending up the forearm  Neurological:     Mental Status: He is alert and oriented to person, place, and time. Mental status is at baseline.  Psychiatric:        Mood and Affect: Mood normal.        Behavior: Behavior normal.     ED Results and Treatments Labs (all labs ordered are listed, but only abnormal results are displayed) Labs Reviewed  COMPREHENSIVE METABOLIC PANEL - Abnormal; Notable for the following components:      Result Value   CO2 20 (*)    Creatinine, Ser 1.71 (*)    GFR, Estimated 43 (*)    All other  components within normal limits  CBC WITH DIFFERENTIAL/PLATELET - Abnormal; Notable for the following components:   RBC 3.94 (*)    MCV 106.9 (*)    Eosinophils Absolute 0.6 (*)    All other components within normal limits  CULTURE, BLOOD (ROUTINE X 2)  CULTURE, BLOOD (ROUTINE X 2)  LACTIC ACID, PLASMA                                                                                                                          Radiology DG Hand Complete Left  Result Date: 12/22/2022 CLINICAL DATA:  Swelling EXAM: LEFT HAND - COMPLETE 3+ VIEW COMPARISON:  Left wrist x-ray 12/20/2022 FINDINGS: There is marked soft tissue swelling of the hand and wrist, increased from prior. There is no radiopaque foreign body. There is no cortical erosion. There is no  acute fracture or dislocation. IMPRESSION: Marked soft tissue swelling of the hand and wrist, increased from prior. No acute osseous abnormality. Electronically Signed   By: Darliss Cheney M.D.   On: 12/22/2022 19:58    Pertinent labs & imaging results that were available during my care of the patient were reviewed by me and considered in my medical decision making (see MDM for details).  Medications Ordered in ED Medications  apixaban (ELIQUIS) tablet 5 mg (5 mg Oral Given 12/22/22 2248)  atorvastatin (LIPITOR) tablet 40 mg (40 mg Oral Given 12/22/22 2248)  carvedilol (COREG) tablet 6.25 mg (6.25 mg Oral Given 12/22/22 2248)  tamsulosin (FLOMAX) capsule 0.4 mg (has no administration in time range)  furosemide (LASIX) injection 40 mg (40 mg Intravenous Given 12/22/22 2249)  cefTRIAXone (ROCEPHIN) 1 g in sodium chloride 0.9 % 100 mL IVPB (has no administration in time range)  cefTRIAXone (ROCEPHIN) 2 g in sodium chloride 0.9 % 100 mL IVPB (0 g Intravenous Stopped 12/22/22 2058)  sodium chloride 0.9 % bolus 500 mL (0 mLs Intravenous Stopped 12/22/22 2251)  vancomycin (VANCOREADY) IVPB 1750 mg/350 mL (0 mg Intravenous Stopped 12/22/22 2251)                                                                                                                                     Procedures Procedures  (including critical care time)  Medical Decision Making / ED Course   MDM:  67 year old male presenting to the emergency department with left hand swelling.  On exam, patient has swelling  to the left hand extending up the forearm.  He looks well overall, vitals with mild tachycardia.  No fever.  Suspect worsening cellulitis.  Patient will need to be admitted for further antibiotic therapy as inpatient.  No focal swelling, fluctuance to suggest underlying abscess.  X-ray on my interpretation without evidence of osteomyelitis.  No wounds.  Will treat with ceftriaxone and vancomycin.  Will obtain labs.  Clinical  Course as of 12/22/22 2318  Thu Dec 22, 2022  2131 Discussed with Dr. Pola Corn, hospitalist who will admit for cellulitis [WS]    Clinical Course User Index [WS] Lonell Grandchild, MD     Additional history obtained:  -External records from outside source obtained and reviewed including: Chart review including previous notes, labs, imaging, consultation notes including ER note for recent visit for cellulitis   Lab Tests: -I ordered, reviewed, and interpreted labs.   The pertinent results include:   Labs Reviewed  COMPREHENSIVE METABOLIC PANEL - Abnormal; Notable for the following components:      Result Value   CO2 20 (*)    Creatinine, Ser 1.71 (*)    GFR, Estimated 43 (*)    All other components within normal limits  CBC WITH DIFFERENTIAL/PLATELET - Abnormal; Notable for the following components:   RBC 3.94 (*)    MCV 106.9 (*)    Eosinophils Absolute 0.6 (*)    All other components within normal limits  CULTURE, BLOOD (ROUTINE X 2)  CULTURE, BLOOD (ROUTINE X 2)  LACTIC ACID, PLASMA    Notable for no leukocytosis   Imaging Studies ordered: I ordered imaging studies including XR hand On my interpretation imaging demonstrates swelling I independently visualized and interpreted imaging. I agree with the radiologist interpretation   Medicines ordered and prescription drug management: Meds ordered this encounter  Medications   DISCONTD: sodium chloride 0.9 % bolus 1,000 mL   cefTRIAXone (ROCEPHIN) 2 g in sodium chloride 0.9 % 100 mL IVPB    Order Specific Question:   Antibiotic Indication:    Answer:   Cellulitis   sodium chloride 0.9 % bolus 500 mL   vancomycin (VANCOREADY) IVPB 1750 mg/350 mL    Order Specific Question:   Indication:    Answer:   Cellulitis   apixaban (ELIQUIS) tablet 5 mg   atorvastatin (LIPITOR) tablet 40 mg   carvedilol (COREG) tablet 6.25 mg   tamsulosin (FLOMAX) capsule 0.4 mg   furosemide (LASIX) injection 40 mg   cefTRIAXone (ROCEPHIN)  1 g in sodium chloride 0.9 % 100 mL IVPB    Order Specific Question:   Antibiotic Indication:    Answer:   Cellulitis    -I have reviewed the patients home medicines and have made adjustments as needed   Consultations Obtained: I requested consultation with the hospitalist,  and discussed lab and imaging findings as well as pertinent plan - they recommend: admission   Cardiac Monitoring: The patient was maintained on a cardiac monitor.  I personally viewed and interpreted the cardiac monitored which showed an underlying rhythm of: NSR  Social Determinants of Health:  Diagnosis or treatment significantly limited by social determinants of health: polysubstance abuse   Reevaluation: After the interventions noted above, I reevaluated the patient and found that their symptoms have improved  Co morbidities that complicate the patient evaluation  Past Medical History:  Diagnosis Date   Anxiety    Arthritis    "all over" (01/07/2015)   CKD (chronic kidney disease), stage IV (HCC)  Depression    Headache    "probably weekly" (01/07/2015)   History of echocardiogram 12/2014   EF 20-25% -> improved up to EF 35 and 40% February 2020:   Homelessness    Hypercholesterolemia    Hypertension    Ischemic dilated cardiomyopathy (HCC)    Noncompliance    Nonischemic dilated cardiomyopathy (HCC) 07/2014   Initial EF was 2025% with global hypokinesis.  As of February 2020 EF up to 35-40%, GRII DD.  Global hypokinesis.  No WMA.  Relatively normal valves.   NSTEMI (non-ST elevated myocardial infarction) [facility]) 2016   2 separate episodes in January and June.  Normal coronary arteries by cardiac catheterization.  Thought to be related to.   Polysubstance abuse (HCC)    etoh, cocaine   Urinary hesitancy    Walking pneumonia 07/2014      Dispostion: Disposition decision including need for hospitalization was considered, and patient admitted to the hospital.    Final Clinical Impression(s) /  ED Diagnoses Final diagnoses:  Cellulitis of hand, left     This chart was dictated using voice recognition software.  Despite best efforts to proofread,  errors can occur which can change the documentation meaning.    Lonell Grandchild, MD 12/22/22 [phone]

## 2022-12-22 NOTE — ED Triage Notes (Signed)
Pt recently seen for left hand swelling at [facility]. Pt dx with cellulitis and sent home with prescription. Pt has been taking medications as prescribed, but states the hand has not improved, and the pain medication was not effective.

## 2022-12-23 ENCOUNTER — Observation Stay (HOSPITAL_COMMUNITY): Payer: 59

## 2022-12-23 ENCOUNTER — Encounter (HOSPITAL_COMMUNITY): Payer: Self-pay | Admitting: Internal Medicine

## 2022-12-23 DIAGNOSIS — Z7901 Long term (current) use of anticoagulants: Secondary | ICD-10-CM | POA: Diagnosis not present

## 2022-12-23 DIAGNOSIS — F1410 Cocaine abuse, uncomplicated: Secondary | ICD-10-CM | POA: Diagnosis present

## 2022-12-23 DIAGNOSIS — F419 Anxiety disorder, unspecified: Secondary | ICD-10-CM | POA: Diagnosis present

## 2022-12-23 DIAGNOSIS — F1010 Alcohol abuse, uncomplicated: Secondary | ICD-10-CM | POA: Diagnosis present

## 2022-12-23 DIAGNOSIS — I5042 Chronic combined systolic (congestive) and diastolic (congestive) heart failure: Secondary | ICD-10-CM | POA: Diagnosis not present

## 2022-12-23 DIAGNOSIS — N183 Chronic kidney disease, stage 3 unspecified: Secondary | ICD-10-CM | POA: Diagnosis not present

## 2022-12-23 DIAGNOSIS — Z881 Allergy status to other antibiotic agents status: Secondary | ICD-10-CM | POA: Diagnosis not present

## 2022-12-23 DIAGNOSIS — Z79899 Other long term (current) drug therapy: Secondary | ICD-10-CM | POA: Diagnosis not present

## 2022-12-23 DIAGNOSIS — Z87891 Personal history of nicotine dependence: Secondary | ICD-10-CM | POA: Diagnosis not present

## 2022-12-23 DIAGNOSIS — I252 Old myocardial infarction: Secondary | ICD-10-CM | POA: Diagnosis not present

## 2022-12-23 DIAGNOSIS — M7989 Other specified soft tissue disorders: Secondary | ICD-10-CM | POA: Diagnosis not present

## 2022-12-23 DIAGNOSIS — N179 Acute kidney failure, unspecified: Secondary | ICD-10-CM | POA: Diagnosis present

## 2022-12-23 DIAGNOSIS — Z91199 Patient's noncompliance with other medical treatment and regimen due to unspecified reason: Secondary | ICD-10-CM | POA: Diagnosis not present

## 2022-12-23 DIAGNOSIS — Z1152 Encounter for screening for COVID-19: Secondary | ICD-10-CM | POA: Diagnosis not present

## 2022-12-23 DIAGNOSIS — I5043 Acute on chronic combined systolic (congestive) and diastolic (congestive) heart failure: Secondary | ICD-10-CM | POA: Diagnosis not present

## 2022-12-23 DIAGNOSIS — E669 Obesity, unspecified: Secondary | ICD-10-CM | POA: Diagnosis present

## 2022-12-23 DIAGNOSIS — L513 Stevens-Johnson syndrome-toxic epidermal necrolysis overlap syndrome: Secondary | ICD-10-CM | POA: Diagnosis present

## 2022-12-23 DIAGNOSIS — L03114 Cellulitis of left upper limb: Secondary | ICD-10-CM | POA: Diagnosis present

## 2022-12-23 DIAGNOSIS — R609 Edema, unspecified: Secondary | ICD-10-CM

## 2022-12-23 DIAGNOSIS — N184 Chronic kidney disease, stage 4 (severe): Secondary | ICD-10-CM | POA: Diagnosis present

## 2022-12-23 DIAGNOSIS — I513 Intracardiac thrombosis, not elsewhere classified: Secondary | ICD-10-CM | POA: Diagnosis present

## 2022-12-23 DIAGNOSIS — E7800 Pure hypercholesterolemia, unspecified: Secondary | ICD-10-CM | POA: Diagnosis present

## 2022-12-23 DIAGNOSIS — F32A Depression, unspecified: Secondary | ICD-10-CM | POA: Diagnosis present

## 2022-12-23 DIAGNOSIS — I340 Nonrheumatic mitral (valve) insufficiency: Secondary | ICD-10-CM | POA: Diagnosis present

## 2022-12-23 DIAGNOSIS — I2510 Atherosclerotic heart disease of native coronary artery without angina pectoris: Secondary | ICD-10-CM | POA: Diagnosis present

## 2022-12-23 DIAGNOSIS — I130 Hypertensive heart and chronic kidney disease with heart failure and stage 1 through stage 4 chronic kidney disease, or unspecified chronic kidney disease: Secondary | ICD-10-CM | POA: Diagnosis present

## 2022-12-23 DIAGNOSIS — I420 Dilated cardiomyopathy: Secondary | ICD-10-CM | POA: Diagnosis present

## 2022-12-23 DIAGNOSIS — I255 Ischemic cardiomyopathy: Secondary | ICD-10-CM | POA: Diagnosis present

## 2022-12-23 DIAGNOSIS — N1832 Chronic kidney disease, stage 3b: Secondary | ICD-10-CM | POA: Diagnosis not present

## 2022-12-23 DIAGNOSIS — L139 Bullous disorder, unspecified: Secondary | ICD-10-CM | POA: Diagnosis not present

## 2022-12-23 LAB — CULTURE, BLOOD (ROUTINE X 2): Special Requests: ADEQUATE

## 2022-12-23 LAB — CBC
HCT: 42.6 % (ref 39.0–52.0)
Hemoglobin: 13.3 g/dL (ref 13.0–17.0)
MCH: 33.5 pg (ref 26.0–34.0)
MCHC: 31.2 g/dL (ref 30.0–36.0)
MCV: 107.3 fL — ABNORMAL HIGH (ref 80.0–100.0)
Platelets: 211 10*3/uL (ref 150–400)
RBC: 3.97 MIL/uL — ABNORMAL LOW (ref 4.22–5.81)
RDW: 13.5 % (ref 11.5–15.5)
WBC: 6.7 10*3/uL (ref 4.0–10.5)
nRBC: 0 % (ref 0.0–0.2)

## 2022-12-23 LAB — BASIC METABOLIC PANEL
Anion gap: 11 (ref 5–15)
BUN: 22 mg/dL (ref 8–23)
CO2: 20 mmol/L — ABNORMAL LOW (ref 22–32)
Calcium: 9 mg/dL (ref 8.9–10.3)
Chloride: 107 mmol/L (ref 98–111)
Creatinine, Ser: 1.78 mg/dL — ABNORMAL HIGH (ref 0.61–1.24)
GFR, Estimated: 41 mL/min — ABNORMAL LOW (ref >60)
Glucose, Bld: 88 mg/dL (ref 70–99)
Potassium: 4.5 mmol/L (ref 3.5–5.1)
Sodium: 138 mmol/L (ref 135–145)

## 2022-12-23 LAB — MAGNESIUM: Magnesium: 2.1 mg/dL (ref 1.7–2.4)

## 2022-12-23 LAB — PROCALCITONIN: Procalcitonin: 0.13 ng/mL

## 2022-12-23 MED ORDER — ENOXAPARIN SODIUM 40 MG/0.4ML IJ SOSY: 40.0000 mg | PREFILLED_SYRINGE | INTRAMUSCULAR | Status: DC

## 2022-12-23 MED ORDER — ACETAMINOPHEN 325 MG PO TABS
650.0000 mg | ORAL_TABLET | Freq: Four times a day (QID) | ORAL | Status: DC | PRN
  Administered 2022-12-23 – 2022-12-26 (×4): 650 mg via ORAL
  Filled 2022-12-23 (×4): qty 2

## 2022-12-23 MED ORDER — SODIUM CHLORIDE 0.9 % IV SOLN
2.0000 g | INTRAVENOUS | Status: DC
  Administered 2022-12-23 – 2022-12-26 (×4): 2 g via INTRAVENOUS
  Filled 2022-12-23 (×4): qty 20

## 2022-12-23 MED ORDER — METHYLPREDNISOLONE SODIUM SUCC 40 MG IJ SOLR
40.0000 mg | INTRAMUSCULAR | Status: AC
  Administered 2022-12-23: 40 mg via INTRAVENOUS
  Filled 2022-12-23: qty 1

## 2022-12-23 MED ORDER — ALBUTEROL SULFATE (2.5 MG/3ML) 0.083% IN NEBU
2.5000 mg | INHALATION_SOLUTION | Freq: Four times a day (QID) | RESPIRATORY_TRACT | Status: DC
  Administered 2022-12-23: 2.5 mg via RESPIRATORY_TRACT
  Filled 2022-12-23: qty 3

## 2022-12-23 MED ORDER — ACETAMINOPHEN 650 MG RE SUPP: 650.0000 mg | Freq: Four times a day (QID) | RECTAL | Status: DC | PRN

## 2022-12-23 MED ORDER — ALBUTEROL SULFATE (2.5 MG/3ML) 0.083% IN NEBU
2.5000 mg | INHALATION_SOLUTION | Freq: Two times a day (BID) | RESPIRATORY_TRACT | Status: DC
  Administered 2022-12-23: 2.5 mg via RESPIRATORY_TRACT
  Filled 2022-12-23: qty 3

## 2022-12-23 MED ORDER — OXYCODONE HCL 5 MG PO TABS
5.0000 mg | ORAL_TABLET | ORAL | Status: DC | PRN
  Administered 2022-12-27 (×2): 5 mg via ORAL
  Filled 2022-12-23 (×2): qty 1

## 2022-12-23 MED ORDER — ALBUTEROL SULFATE (2.5 MG/3ML) 0.083% IN NEBU
2.5000 mg | INHALATION_SOLUTION | RESPIRATORY_TRACT | Status: DC | PRN
  Administered 2022-12-24: 2.5 mg via RESPIRATORY_TRACT

## 2022-12-23 MED ORDER — HYDROCORTISONE 0.5 % EX CREA
TOPICAL_CREAM | Freq: Four times a day (QID) | CUTANEOUS | Status: DC | PRN
  Administered 2022-12-23: 1 via TOPICAL
  Filled 2022-12-23: qty 28.35

## 2022-12-23 MED ORDER — HYDRALAZINE HCL 20 MG/ML IJ SOLN: 10.0000 mg | Freq: Four times a day (QID) | INTRAMUSCULAR | Status: DC | PRN

## 2022-12-23 MED ORDER — SODIUM CHLORIDE 0.9 % IV SOLN: INTRAVENOUS | Status: DC | PRN

## 2022-12-23 MED ORDER — FAMOTIDINE IN NACL 20-0.9 MG/50ML-% IV SOLN
20.0000 mg | Freq: Once | INTRAVENOUS | Status: AC
  Administered 2022-12-23: 20 mg via INTRAVENOUS
  Filled 2022-12-23: qty 50

## 2022-12-23 NOTE — Progress Notes (Signed)
Bilateral lower extremity and left upper extremity venous studies completed.   Preliminary results relayed to MD and RN.  Please see CV Procedures for preliminary results.  Kiven Vangilder, RVT  9:55 AM 12/23/22

## 2022-12-23 NOTE — Progress Notes (Signed)
Mobility Specialist - Progress Note   12/23/22 1349  Mobility  Activity Ambulated with assistance in hallway  Level of Assistance Modified independent, requires aide device or extra time  Assistive Device Cane  Distance Ambulated (ft) 150 ft  Range of Motion/Exercises Active  Activity Response Tolerated well  Mobility Referral Yes  $Mobility charge 1 Mobility  Mobility Specialist Start Time (ACUTE ONLY) 1330  Mobility Specialist Stop Time (ACUTE ONLY) 1348  Mobility Specialist Time Calculation (min) (ACUTE ONLY) 18 min   Pt was found in bed and agreeable to ambulate. No complaints during session and at EOS returned to recliner chair with call bell and necessities in reach. NT notified.  Billey Chang Mobility Specialist

## 2022-12-23 NOTE — Progress Notes (Signed)
[facility]                                                                              [name], is a 67 y.o. male, DOB - [birth year], WUJ:[id number] Admit date - 12/22/2022    Outpatient Primary MD for the patient is [name]., FNP  LOS - 0  days  Chief Complaint  Patient presents with   Hand Pain       Brief summary   Patient is a 67 year old male with HTN, HLD, CAD, NSTEMI, nonischemic dilated cardiomyopathy, chronic systolic CHF (EF 20 to 25%), CKD 4, polysubstance abuse (cocaine, alcohol), anxiety, depression. 6/1, patient was seen in the ED with complaint of pain, redness and swelling of right hand for 2 days.  X-ray showed soft tissue swelling without osseous abnormality.  Patient was given 1 dose of IV Rocephin and discharged on oral cefuroxime and oxycodone. Patient returned back to the ED on 6/6 with worsening redness, pain in the left hand despite compliance with antibiotics.  Patient was noted to have left hand, forearm warm and swollen, decreased ROM left wrist, 1-2+ bilateral lower extremity edema, left worse than right  Afebrile, HR 109, BP 159/103 Left hand x-ray showed marked soft tissue swelling of the hand, wrist, increased from prior without acute osseous abnormality.   Assessment & Plan    Principal Problem:   Cellulitis of left hand -Obtain venous Doppler to rule out any DVT in the left hand/forearm -Continue IV Rocephin, increase to 2 g IV daily -Per patient swelling and redness decreased today and able to move the wrist better.    Active Problems: Nonischemic cardiomyopathy, acute on chronic combined systolic and diastolic CHF exacerbation, severe mitral regurgitation -Bilateral lower extremity edema noted, left worse than right -Obtain venous Dopplers lower extremity to rule out DVT -Recent 2D echo 11/17/2022 had shown EF 20 to 25%, global hypokinesis, G2 DD, severe MR, apical thrombus (was discharged home on Lasix 40 mg  twice a day on 5/5) -Continue Lasix 40 mg IV twice daily -Strict I's and O's and daily weights  Apical thrombus -Recently seen on the 2D echo on 11/17/2022, continue eliquis    CKD (chronic kidney disease) stage 3b, GFR 30-59 ml/min (HCC) -Baseline creatinine 1.9-2.3 -Creatinine currently stable, follow closely with diuresis  History of cocaine use -Counseled strongly on cocaine cessation  Essential hypertension -BP currently stable   Obesity Estimated body mass index is 30.4 kg/m as calculated from the following:   Height as of this encounter: 5\' 8"  (1.727 m).   Weight as of this encounter: 90.7 kg.  Code Status: Full CODE STATUS DVT Prophylaxis:   apixaban (ELIQUIS) tablet 5 mg   Level of Care: Level of care: Telemetry Family Communication: Updated patient Disposition Plan:      Remains inpatient appropriate: Workup in progress   Procedures:  None  Consultants:   None  Antimicrobials:   Anti-infectives (From admission, onward)    Start     Dose/Rate Route Frequency Ordered Stop   12/23/22 2000  cefTRIAXone (ROCEPHIN) 1 g in sodium chloride 0.9 % 100 mL IVPB  Status:  Discontinued        1 g 200 mL/hr over 30 Minutes Intravenous Every 24 hours 12/22/22 2224 12/23/22 0909   12/23/22 2000  cefTRIAXone (ROCEPHIN) 2 g in sodium chloride 0.9 % 100 mL IVPB        2 g 200 mL/hr over 30 Minutes Intravenous Every 24 hours 12/23/22 0909 12/30/22 1959   12/22/22 1945  vancomycin (VANCOREADY) IVPB 1750 mg/350 mL        1,750 mg 175 mL/hr over 120 Minutes Intravenous  Once 12/22/22 1941 12/22/22 2251   12/22/22 1915  cefTRIAXone (ROCEPHIN) 2 g in sodium chloride 0.9 % 100 mL IVPB        2 g 200 mL/hr over 30 Minutes Intravenous  Once 12/22/22 1913 12/22/22 2058          Medications  apixaban  5 mg Oral BID   atorvastatin  40 mg Oral QHS   carvedilol  6.25 mg Oral BID   furosemide  40 mg Intravenous BID   tamsulosin  0.4 mg Oral Daily      Subjective:    Dameian Lozo was seen and examined today.  Feels his left hand and forearm swelling is improving, still has swelling in the legs.  No chest pain, acute shortness of breath, nausea vomiting or abdominal pain.    Objective:   Vitals:   12/23/22 0204 12/23/22 0259 12/23/22 0637 12/23/22 0822  BP: 136/83 139/82 119/69   Pulse: (!) 103 100 85   Resp: 17 17 17    Temp: (!) 100.5 F (38.1 C) (!) 100.5 F (38.1 C) 99.1 F (37.3 C)   TempSrc: Oral Oral Oral   SpO2: 99% 98% 97% 98%  Weight:      Height:        Intake/Output Summary (Last 24 hours) at 12/23/2022 0945 Last data filed at 12/23/2022 0200 Gross per 24 hour  Intake 1114.4 ml  Output --  Net 1114.4 ml     Wt Readings from Last 3 Encounters:  12/23/22 90.7 kg  12/17/22 87.1 kg  12/07/22 87.1 kg     Exam General: Alert and oriented x 3, NAD Cardiovascular: S1 S2 auscultated,  RRR Respiratory: CTAB Gastrointestinal: Soft, nontender, nondistended, + bowel sounds Ext: 2+ pedal edema bilaterally L>R Neuro: no new deficits Skin: Left hand and forearm swelling with redness, tenderness at the wrist, ROM decreased Psych: Normal affect     Data Reviewed:  I have personally reviewed following labs    CBC Lab Results  Component Value Date   WBC 6.7 12/23/2022   RBC 3.97 (L) 12/23/2022   HGB 13.3 12/23/2022   HCT 42.6 12/23/2022   MCV 107.3 (H) 12/23/2022   MCH 33.5 12/23/2022   PLT 211 12/23/2022   MCHC 31.2 12/23/2022   RDW 13.5 12/23/2022   LYMPHSABS 1.1 12/22/2022   MONOABS 0.5 12/22/2022   EOSABS 0.6 (H) 12/22/2022   BASOSABS 0.0 12/22/2022     Last metabolic panel Lab Results  Component Value Date   NA 138 12/23/2022   K 4.5 12/23/2022   CL 107 12/23/2022   CO2 20 (L) 12/23/2022   BUN 22 12/23/2022   CREATININE 1.78 (H) 12/23/2022   GLUCOSE 88 12/23/2022   GFRNONAA 41 (L) 12/23/2022   GFRAA 26 (L) 04/17/2020   CALCIUM 9.0 12/23/2022   PHOS 3.2 07/31/2016   PROT 6.9 12/22/2022   ALBUMIN 3.8  12/22/2022   LABGLOB 2.9 05/11/2015   AGRATIO 1.0 05/11/2015   BILITOT 0.8 12/22/2022  ALKPHOS 86 12/22/2022   AST 21 12/22/2022   ALT 19 12/22/2022   ANIONGAP 11 12/23/2022    CBG (last 3)  No results for input(s): "GLUCAP" in the last 72 hours.    Coagulation Profile: No results for input(s): "INR", "PROTIME" in the last 168 hours.   Radiology Studies: I have personally reviewed the imaging studies  DG Hand Complete Left  Result Date: 12/22/2022 CLINICAL DATA:  Swelling EXAM: LEFT HAND - COMPLETE 3+ VIEW COMPARISON:  Left wrist x-ray 12/20/2022 FINDINGS: There is marked soft tissue swelling of the hand and wrist, increased from prior. There is no radiopaque foreign body. There is no cortical erosion. There is no acute fracture or dislocation. IMPRESSION: Marked soft tissue swelling of the hand and wrist, increased from prior. No acute osseous abnormality. Electronically Signed   By: Darliss Cheney M.D.   On: 12/22/2022 19:58       Morganna Styles M.D. [facility] 12/23/2022, 9:45 AM  Available via Epic secure chat 7am-7pm After 7 pm, please refer to night coverage provider listed on amion.

## 2022-12-23 NOTE — Progress Notes (Addendum)
    Patient Name: Haiden Rawlinson           DOB: 08/18/1955  MRN: [id number]      Admission Date: 12/22/2022  Attending Provider: Lorin Glass, MD  Primary Diagnosis: Cellulitis of left hand   Level of care: Telemetry    CROSS COVER NOTE   Date of Service   12/23/2022   Costas Sena, 67 y.o. male, was admitted on 12/22/2022 for Cellulitis of left hand.    HPI/Events of Note   RN reports allergy/ sensitivity to vancomycin  Symptoms include itching, mild hives, cough. Airway patent, no respiratory distress.   RN requesting benadryl, but pt has Hx of prolong QTc. EKG ordered.  Hold off on benadryl/ antihistamine for now, ok to give pepcid, topical hydrocotisone, albuterol.  EKG interpreted as ST with RBB, HR 110's, QTc prolonged 536, 549, 606 ms Obtain lab/ electrolytes and will replenish if needed Level of care changed to telemetry for QTc monitoring   Interventions/ Plan   Above        Anthoney Harada, DNP, ACNPC- AG [facility] [place]

## 2022-12-23 NOTE — Plan of Care (Signed)

## 2022-12-23 NOTE — Progress Notes (Addendum)
   12/23/22 0110  Assess: MEWS Score  Pulse Rate (!) 116  ECG Heart Rate (!) 116  Assess: MEWS Score  MEWS Temp 0  MEWS Systolic 0  MEWS Pulse 2  MEWS RR 0  MEWS LOC 0  MEWS Score 2  MEWS Score Color Yellow  Assess: if the MEWS score is Yellow or Red  Were vital signs taken at a resting state? Yes  Focused Assessment No change from prior assessment  Does the patient meet 2 or more of the SIRS criteria? No  Does the patient have a confirmed or suspected source of infection? Yes  MEWS guidelines implemented  Yes, yellow  Treat  MEWS Interventions Considered administering scheduled or prn medications/treatments as ordered  Take Vital Signs  Increase Vital Sign Frequency  Yellow: Q2hr x1, continue Q4hrs until patient remains green for 12hrs  Escalate  MEWS: Escalate Yellow: Discuss with charge nurse and consider notifying provider and/or RRT  Notify: Charge Nurse/RN  Name of Charge Nurse/RN Notified Sondra Barges RN  Provider Notification  Provider Name/Title Anthoney Harada NP  Date Provider Notified 12/23/22  Time Provider Notified 0110  Method of Notification Page (text)  Notification Reason Change in status;Other (Comment) (Abnormal EKG)  Provider response See new orders;En route  Date of Provider Response 12/23/22  Time of Provider Response 0110  Assess: SIRS CRITERIA  SIRS Temperature  0  SIRS Pulse 1  SIRS Respirations  0  SIRS WBC 0  SIRS Score Sum  1   Received Patient from ED with known reaction to Vancomycin of hives.  Patient arrived 00:15 coughing, breath sounds clear, hives evident L arm, Patient settled in room and used BR, VS obtained.  Comfort measures provided.  00:45 Text to On-Call requesting Benadryl.  Received order for EKG, abnormal results text back to On-Call.   0106 requested Pepcid from pharmacy and reported drug reaction, requested it be added to allergy list. 0110 Yellow MEWS R/T EKG pulse rate 116 text to On-Call.   0116 Pepcid IV administered. 0130  Tylenol PO, 0135 Albuterol, 0153 Solu-medrol, 0155 Transferred to 1435 for telemetry monitoring.

## 2022-12-24 DIAGNOSIS — L03114 Cellulitis of left upper limb: Secondary | ICD-10-CM | POA: Diagnosis not present

## 2022-12-24 DIAGNOSIS — N183 Chronic kidney disease, stage 3 unspecified: Secondary | ICD-10-CM

## 2022-12-24 DIAGNOSIS — I5042 Chronic combined systolic (congestive) and diastolic (congestive) heart failure: Secondary | ICD-10-CM

## 2022-12-24 LAB — CBC
HCT: 36.9 % — ABNORMAL LOW (ref 39.0–52.0)
Hemoglobin: 11.9 g/dL — ABNORMAL LOW (ref 13.0–17.0)
MCH: 33.6 pg (ref 26.0–34.0)
MCHC: 32.2 g/dL (ref 30.0–36.0)
MCV: 104.2 fL — ABNORMAL HIGH (ref 80.0–100.0)
Platelets: 198 10*3/uL (ref 150–400)
RBC: 3.54 MIL/uL — ABNORMAL LOW (ref 4.22–5.81)
RDW: 13.6 % (ref 11.5–15.5)
WBC: 10.2 10*3/uL (ref 4.0–10.5)
nRBC: 0 % (ref 0.0–0.2)

## 2022-12-24 LAB — CULTURE, BLOOD (ROUTINE X 2)
Culture: NO GROWTH
Special Requests: ADEQUATE

## 2022-12-24 LAB — BASIC METABOLIC PANEL
Anion gap: 11 (ref 5–15)
BUN: 33 mg/dL — ABNORMAL HIGH (ref 8–23)
CO2: 20 mmol/L — ABNORMAL LOW (ref 22–32)
Calcium: 8.9 mg/dL (ref 8.9–10.3)
Chloride: 105 mmol/L (ref 98–111)
Creatinine, Ser: 2.03 mg/dL — ABNORMAL HIGH (ref 0.61–1.24)
GFR, Estimated: 35 mL/min — ABNORMAL LOW (ref >60)
Glucose, Bld: 133 mg/dL — ABNORMAL HIGH (ref 70–99)
Potassium: 4.4 mmol/L (ref 3.5–5.1)
Sodium: 136 mmol/L (ref 135–145)

## 2022-12-24 NOTE — Progress Notes (Signed)
Mobility Specialist - Progress Note   12/24/22 1058  Mobility  Activity Ambulated with assistance in hallway  Level of Assistance Modified independent, requires aide device or extra time  Assistive Device Cane  Distance Ambulated (ft) 350 ft  Range of Motion/Exercises Active  Activity Response Tolerated well  Mobility Referral Yes  $Mobility charge 1 Mobility  Mobility Specialist Start Time (ACUTE ONLY) 1045  Mobility Specialist Stop Time (ACUTE ONLY) 1057  Mobility Specialist Time Calculation (min) (ACUTE ONLY) 12 min   Pt was found sitting EOB and agreeable to ambulate. Had no complaints during session and at EOS returned to sit EOB. Call bell and necessities I reach.  Billey Chang Mobility Specialist

## 2022-12-24 NOTE — Progress Notes (Signed)
[facility]                                                                              [name], is a 67 y.o. male, DOB - 1956-01-10, ZOX:[id number] Admit date - 12/22/2022    Outpatient Primary MD for the patient is [name], FNP  LOS - 1  days  Chief Complaint  Patient presents with   Hand Pain       Brief summary   Patient is a 67 year old male with HTN, HLD, CAD, NSTEMI, nonischemic dilated cardiomyopathy, chronic systolic CHF (EF 20 to 25%), CKD 4, polysubstance abuse (cocaine, alcohol), anxiety, depression. 6/1, patient was seen in the ED with complaint of pain, redness and swelling of right hand for 2 days.  X-ray showed soft tissue swelling without osseous abnormality.  Patient was given 1 dose of IV Rocephin and discharged on oral cefuroxime and oxycodone. Patient returned back to the ED on 6/6 with worsening redness, pain in the left hand despite compliance with antibiotics.  Patient was noted to have left hand, forearm warm and swollen, decreased ROM left wrist, 1-2+ bilateral lower extremity edema, left worse than right  Afebrile, HR 109, BP 159/103 Left hand x-ray showed marked soft tissue swelling of the hand, wrist, increased from prior without acute osseous abnormality.   Assessment & Plan    Principal Problem:   Cellulitis of left hand -Venous Dopplers LUE negative for any DVT or SVT.   -Continue IV Rocephin 2 g IV daily.   -Left forearm and hand swelling much better today, ROM improving    Active Problems: Nonischemic cardiomyopathy, acute on chronic combined systolic and diastolic CHF exacerbation, severe mitral regurgitation -Bilateral lower extremity edema noted, left worse than right -Venous Doppler lower extremities negative for DVT -Recent 2D echo 11/17/2022 had shown EF 20 to 25%, global hypokinesis, G2 DD, severe MR, apical thrombus (was discharged home on Lasix 40 mg twice a day on 5/5) -Improving, continue Lasix 40 mg  IV twice daily, negative balance of 85.6 L  Apical thrombus -Recently seen on the 2D echo on 11/17/2022, continue eliquis    CKD (chronic kidney disease) stage 3b, GFR 30-59 ml/min (HCC) -Baseline creatinine 1.9-2.3 -Creatinine trended up to 2.0, still at baseline, will continue IV Lasix today  History of cocaine use -Counseled strongly on cocaine cessation  Essential hypertension -BP currently stable   Obesity Estimated body mass index is 31.68 kg/m as calculated from the following:   Height as of this encounter: 5\' 8"  (1.727 m).   Weight as of this encounter: 94.5 kg.  Code Status: Full CODE STATUS DVT Prophylaxis:   apixaban (ELIQUIS) tablet 5 mg   Level of Care: Level of care: Med-Surg Family Communication: Updated patient Disposition Plan:      Remains inpatient appropriate: Workup in progress, improving, hopefully DC home tomorrow   Procedures:  None  Consultants:   None  Antimicrobials:   Anti-infectives (From admission, onward)    Start     Dose/Rate Route Frequency Ordered Stop   12/23/22 2000  cefTRIAXone (ROCEPHIN) 1 g in sodium chloride 0.9 % 100 mL IVPB  Status:  Discontinued        1 g 200 mL/hr over 30 Minutes Intravenous Every 24 hours 12/22/22 2224 12/23/22 0909   12/23/22 2000  cefTRIAXone (ROCEPHIN) 2 g in sodium chloride 0.9 % 100 mL IVPB        2 g 200 mL/hr over 30 Minutes Intravenous Every 24 hours 12/23/22 0909 12/30/22 1959   12/22/22 1945  vancomycin (VANCOREADY) IVPB 1750 mg/350 mL        1,750 mg 175 mL/hr over 120 Minutes Intravenous  Once 12/22/22 1941 12/22/22 2251   12/22/22 1915  cefTRIAXone (ROCEPHIN) 2 g in sodium chloride 0.9 % 100 mL IVPB        2 g 200 mL/hr over 30 Minutes Intravenous  Once 12/22/22 1913 12/22/22 2058          Medications  apixaban  5 mg Oral BID   atorvastatin  40 mg Oral QHS   carvedilol  6.25 mg Oral BID   furosemide  40 mg Intravenous BID   tamsulosin  0.4 mg Oral Daily      Subjective:    [name] was seen and examined today.  Left forearm and hand swelling much improved today, decreased redness, improving ROM.  Lower extremity swelling also improving.,  No chest pain or shortness of breath.  No fevers   Objective:   Vitals:   12/23/22 1944 12/24/22 0418 12/24/22 0511 12/24/22 0820  BP: 115/78 111/78  112/84  Pulse: 96 95  91  Resp: 16 18  20   Temp: 98.8 F (37.1 C) 98.9 F (37.2 C)    TempSrc: Oral Oral    SpO2: 98% 97%  97%  Weight:   94.5 kg   Height:        Intake/Output Summary (Last 24 hours) at 12/24/2022 1158 Last data filed at 12/24/2022 0900 Gross per 24 hour  Intake 360 ml  Output 1800 ml  Net -1440 ml     Wt Readings from Last 3 Encounters:  12/24/22 94.5 kg  12/17/22 87.1 kg  12/07/22 87.1 kg   Physical Exam General: Alert and oriented x 3, NAD Cardiovascular: S1 S2 clear, RRR.  Respiratory: CTAB, no wheezing Gastrointestinal: Soft, nontender, nondistended, NBS Ext: 1+ pedal edema bilaterally Neuro: no new deficits Skin: Left hand and forearm swelling with redness improving, no TTP, ROM improving Psych: Normal affect     Data Reviewed:  I have personally reviewed following labs    CBC Lab Results  Component Value Date   WBC 10.2 12/24/2022   RBC 3.54 (L) 12/24/2022   HGB 11.9 (L) 12/24/2022   HCT 36.9 (L) 12/24/2022   MCV 104.2 (H) 12/24/2022   MCH 33.6 12/24/2022   PLT 198 12/24/2022   MCHC 32.2 12/24/2022   RDW 13.6 12/24/2022   LYMPHSABS 1.1 12/22/2022   MONOABS 0.5 12/22/2022   EOSABS 0.6 (H) 12/22/2022   BASOSABS 0.0 12/22/2022     Last metabolic panel Lab Results  Component Value Date   NA 136 12/24/2022   K 4.4 12/24/2022   CL 105 12/24/2022   CO2 20 (L) 12/24/2022   BUN 33 (H) 12/24/2022   CREATININE 2.03 (H) 12/24/2022   GLUCOSE 133 (H) 12/24/2022   GFRNONAA 35 (L) 12/24/2022   GFRAA 26 (L) 04/17/2020   CALCIUM 8.9 12/24/2022   PHOS 3.2 07/31/2016   PROT 6.9 12/22/2022   ALBUMIN 3.8 12/22/2022    LABGLOB 2.9 05/11/2015   AGRATIO 1.0 05/11/2015   BILITOT 0.8 12/22/2022  ALKPHOS 86 12/22/2022   AST 21 12/22/2022   ALT 19 12/22/2022   ANIONGAP 11 12/24/2022    CBG (last 3)  No results for input(s): "GLUCAP" in the last 72 hours.    Coagulation Profile: No results for input(s): "INR", "PROTIME" in the last 168 hours.   Radiology Studies: I have personally reviewed the imaging studies  VAS [place] UPPER EXTREMITY VENOUS DUPLEX  Result Date: 12/24/2022 UPPER VENOUS STUDY  Patient Name:  [name]  Date of Exam:   12/23/2022 Medical Rec #: [id number]       Accession #:    [id number] Date of Birth: Dec 20, 1955      Patient Gender: M Patient Age:   46 years Exam Location:  [facility] Procedure:      VAS [place] UPPER EXTREMITY VENOUS DUPLEX Referring Phys: Lorin Glass --------------------------------------------------------------------------------  Indications: Swelling, Pain, and Erythema Comparison Study: No previous study. Performing Technologist: McKayla Maag RVT, [place]  Examination Guidelines: A complete evaluation includes B-mode imaging, spectral Doppler, color Doppler, and power Doppler as needed of all accessible portions of each vessel. Bilateral testing is considered an integral part of a complete examination. Limited examinations for reoccurring indications may be performed as noted.  Right Findings: +----------+------------+---------+-----------+----------+-------+ RIGHT     CompressiblePhasicitySpontaneousPropertiesSummary +----------+------------+---------+-----------+----------+-------+ Subclavian    Full       Yes       Yes                      +----------+------------+---------+-----------+----------+-------+  Left Findings: +----------+------------+---------+-----------+----------+-------+ LEFT      CompressiblePhasicitySpontaneousPropertiesSummary +----------+------------+---------+-----------+----------+-------+ IJV           Full       Yes       Yes                       +----------+------------+---------+-----------+----------+-------+ Subclavian    Full       Yes       Yes                      +----------+------------+---------+-----------+----------+-------+ Axillary      Full       Yes       Yes                      +----------+------------+---------+-----------+----------+-------+ Brachial      Full       Yes       Yes                      +----------+------------+---------+-----------+----------+-------+ Radial        Full                                          +----------+------------+---------+-----------+----------+-------+ Ulnar         Full                                          +----------+------------+---------+-----------+----------+-------+ Cephalic      Full                                          +----------+------------+---------+-----------+----------+-------+  Basilic       Full                                          +----------+------------+---------+-----------+----------+-------+  Summary:  Right: No evidence of thrombosis in the subclavian.  Left: No evidence of deep vein thrombosis in the upper extremity. No evidence of superficial vein thrombosis in the upper extremity.  *See table(s) above for measurements and observations.  Diagnosing physician: Gerarda Fraction Electronically signed by Gerarda Fraction on 12/24/2022 at 9:35:15 AM.    Final    VAS [place] LOWER EXTREMITY VENOUS (DVT)  Result Date: 12/24/2022  Lower Venous DVT Study Patient Name:  [name]  Date of Exam:   12/23/2022 Medical Rec #: [id number]       Accession #:    [id number] Date of Birth: September 05, 1955      Patient Gender: M Patient Age:   83 years Exam Location:  [facility] Procedure:      VAS [place] LOWER EXTREMITY VENOUS (DVT) Referring Phys: Samer Dutton --------------------------------------------------------------------------------  Indications: Edema.  Comparison Study: No previous study. Performing  Technologist: McKayla Maag RVT, [place]  Examination Guidelines: A complete evaluation includes B-mode imaging, spectral Doppler, color Doppler, and power Doppler as needed of all accessible portions of each vessel. Bilateral testing is considered an integral part of a complete examination. Limited examinations for reoccurring indications may be performed as noted. The reflux portion of the exam is performed with the patient in reverse Trendelenburg.  +---------+---------------+---------+-----------+----------+--------------+ RIGHT    CompressibilityPhasicitySpontaneityPropertiesThrombus Aging +---------+---------------+---------+-----------+----------+--------------+ CFV      Full           Yes      Yes                                 +---------+---------------+---------+-----------+----------+--------------+ SFJ      Full                                                        +---------+---------------+---------+-----------+----------+--------------+ FV Prox  Full                                                        +---------+---------------+---------+-----------+----------+--------------+ FV Mid   Full                                                        +---------+---------------+---------+-----------+----------+--------------+ FV DistalFull                                                        +---------+---------------+---------+-----------+----------+--------------+ PFV      Full                                                        +---------+---------------+---------+-----------+----------+--------------+  POP      Full           Yes      Yes                                 +---------+---------------+---------+-----------+----------+--------------+ PTV      Full                                                        +---------+---------------+---------+-----------+----------+--------------+ PERO     Full                                                         +---------+---------------+---------+-----------+----------+--------------+   +---------+---------------+---------+-----------+----------+--------------+ LEFT     CompressibilityPhasicitySpontaneityPropertiesThrombus Aging +---------+---------------+---------+-----------+----------+--------------+ CFV      Full           Yes      Yes                                 +---------+---------------+---------+-----------+----------+--------------+ SFJ      Full                                                        +---------+---------------+---------+-----------+----------+--------------+ FV Prox  Full                                                        +---------+---------------+---------+-----------+----------+--------------+ FV Mid   Full                                                        +---------+---------------+---------+-----------+----------+--------------+ FV DistalFull                                                        +---------+---------------+---------+-----------+----------+--------------+ PFV      Full                                                        +---------+---------------+---------+-----------+----------+--------------+ POP      Full           Yes      Yes                                 +---------+---------------+---------+-----------+----------+--------------+  PTV      Full                                                        +---------+---------------+---------+-----------+----------+--------------+ PERO     Full                                                        +---------+---------------+---------+-----------+----------+--------------+     Summary: BILATERAL: - No evidence of deep vein thrombosis seen in the lower extremities, bilaterally. - No evidence of superficial venous thrombosis in the lower extremities, bilaterally. -No evidence of popliteal cyst, bilaterally.   *See table(s)  above for measurements and observations. Electronically signed by Gerarda Fraction on 12/24/2022 at 9:35:02 AM.    Final    DG Hand Complete Left  Result Date: 12/22/2022 CLINICAL DATA:  Swelling EXAM: LEFT HAND - COMPLETE 3+ VIEW COMPARISON:  Left wrist x-ray 12/20/2022 FINDINGS: There is marked soft tissue swelling of the hand and wrist, increased from prior. There is no radiopaque foreign body. There is no cortical erosion. There is no acute fracture or dislocation. IMPRESSION: Marked soft tissue swelling of the hand and wrist, increased from prior. No acute osseous abnormality. Electronically Signed   By: Darliss Cheney M.D.   On: 12/22/2022 19:58       Pranav Lince M.D. [facility] 12/24/2022, 11:58 AM  Available via Epic secure chat 7am-7pm After 7 pm, please refer to night coverage provider listed on amion.

## 2022-12-25 DIAGNOSIS — N183 Chronic kidney disease, stage 3 unspecified: Secondary | ICD-10-CM | POA: Diagnosis not present

## 2022-12-25 DIAGNOSIS — L03114 Cellulitis of left upper limb: Secondary | ICD-10-CM | POA: Diagnosis not present

## 2022-12-25 DIAGNOSIS — I5042 Chronic combined systolic (congestive) and diastolic (congestive) heart failure: Secondary | ICD-10-CM | POA: Diagnosis not present

## 2022-12-25 LAB — CBC
HCT: 39.7 % (ref 39.0–52.0)
Hemoglobin: 12.7 g/dL — ABNORMAL LOW (ref 13.0–17.0)
MCH: 33.8 pg (ref 26.0–34.0)
MCHC: 32 g/dL (ref 30.0–36.0)
MCV: 105.6 fL — ABNORMAL HIGH (ref 80.0–100.0)
Platelets: 213 10*3/uL (ref 150–400)
RBC: 3.76 MIL/uL — ABNORMAL LOW (ref 4.22–5.81)
RDW: 14.1 % (ref 11.5–15.5)
WBC: 7.2 10*3/uL (ref 4.0–10.5)
nRBC: 0.6 % — ABNORMAL HIGH (ref 0.0–0.2)

## 2022-12-25 LAB — BASIC METABOLIC PANEL
Anion gap: 11 (ref 5–15)
BUN: 38 mg/dL — ABNORMAL HIGH (ref 8–23)
CO2: 20 mmol/L — ABNORMAL LOW (ref 22–32)
Calcium: 9 mg/dL (ref 8.9–10.3)
Chloride: 106 mmol/L (ref 98–111)
Creatinine, Ser: 1.9 mg/dL — ABNORMAL HIGH (ref 0.61–1.24)
GFR, Estimated: 38 mL/min — ABNORMAL LOW (ref >60)
Glucose, Bld: 92 mg/dL (ref 70–99)
Potassium: 4.1 mmol/L (ref 3.5–5.1)
Sodium: 137 mmol/L (ref 135–145)

## 2022-12-25 MED ORDER — FUROSEMIDE 10 MG/ML IJ SOLN
60.0000 mg | Freq: Two times a day (BID) | INTRAMUSCULAR | Status: DC
  Administered 2022-12-25 – 2022-12-26 (×2): 60 mg via INTRAVENOUS
  Filled 2022-12-25 (×2): qty 6

## 2022-12-25 MED ORDER — DIPHENHYDRAMINE HCL 25 MG PO CAPS
25.0000 mg | ORAL_CAPSULE | Freq: Four times a day (QID) | ORAL | Status: DC | PRN
  Administered 2022-12-25 – 2022-12-27 (×4): 25 mg via ORAL
  Filled 2022-12-25 (×4): qty 1

## 2022-12-25 NOTE — Progress Notes (Signed)
[facility]                                                                              [name], is a 67 y.o. male, DOB - 1956-07-04, ZOX:[id number] Admit date - 12/22/2022    Outpatient Primary MD for the patient is [name], FNP  LOS - 2  days  Chief Complaint  Patient presents with   Hand Pain       Brief summary   Patient is a 67 year old male with HTN, HLD, CAD, NSTEMI, nonischemic dilated cardiomyopathy, chronic systolic CHF (EF 20 to 25%), CKD 4, polysubstance abuse (cocaine, alcohol), anxiety, depression. 6/1, patient was seen in the ED with complaint of pain, redness and swelling of right hand for 2 days.  X-ray showed soft tissue swelling without osseous abnormality.  Patient was given 1 dose of IV Rocephin and discharged on oral cefuroxime and oxycodone. Patient returned back to the ED on 6/6 with worsening redness, pain in the left hand despite compliance with antibiotics.  Patient was noted to have left hand, forearm warm and swollen, decreased ROM left wrist, 1-2+ bilateral lower extremity edema, left worse than right  Afebrile, HR 109, BP 159/103 Left hand x-ray showed marked soft tissue swelling of the hand, wrist, increased from prior without acute osseous abnormality.   Assessment & Plan    Principal Problem:   Cellulitis of left hand -Venous Dopplers LUE negative for any DVT or SVT.   -Continue IV Rocephin 2 g IV daily.   -Left hand swelling improving   Active Problems: Nonischemic cardiomyopathy, acute on chronic combined systolic and diastolic CHF exacerbation, severe mitral regurgitation -Bilateral lower extremity edema noted, left worse than right -Venous Doppler lower extremities negative for DVT -Recent 2D echo 11/17/2022 had shown EF 20 to 25%, global hypokinesis, G2 DD, severe MR, apical thrombus (was discharged home on Lasix 40 mg twice a day on 5/5) -Still has significant fluid overload with lower extremity edema,  increase Lasix to 60 mg IV twice daily -Will consult cardiology (follows Dr. Sharyn Lull)  Apical thrombus -Recently seen on the 2D echo on 11/17/2022, continue eliquis    CKD (chronic kidney disease) stage 3b, GFR 30-59 ml/min (HCC) -Baseline creatinine 1.9-2.3 -Creatinine improving, 1.9, continue to monitor closely with IV diuresis  History of cocaine use -Counseled strongly on cocaine cessation -Urine drug screen was not obtained this admission  Essential hypertension -BP currently stable   Obesity Estimated body mass index is 30.5 kg/m as calculated from the following:   Height as of this encounter: 5\' 8"  (1.727 m).   Weight as of this encounter: 91 kg.  Code Status: Full CODE STATUS DVT Prophylaxis:   apixaban (ELIQUIS) tablet 5 mg   Level of Care: Level of care: Med-Surg Family Communication: Updated patient Disposition Plan:      Remains inpatient appropriate: Workup in progress   Procedures:  None  Consultants:   Cardiology Antimicrobials:   Anti-infectives (From admission, onward)    Start     Dose/Rate Route Frequency Ordered Stop   12/23/22 2000  cefTRIAXone (ROCEPHIN) 1 g in sodium chloride 0.9 % 100  mL IVPB  Status:  Discontinued        1 g 200 mL/hr over 30 Minutes Intravenous Every 24 hours 12/22/22 2224 12/23/22 0909   12/23/22 2000  cefTRIAXone (ROCEPHIN) 2 g in sodium chloride 0.9 % 100 mL IVPB        2 g 200 mL/hr over 30 Minutes Intravenous Every 24 hours 12/23/22 0909 12/30/22 1959   12/22/22 1945  vancomycin (VANCOREADY) IVPB 1750 mg/350 mL        1,750 mg 175 mL/hr over 120 Minutes Intravenous  Once 12/22/22 1941 12/22/22 2251   12/22/22 1915  cefTRIAXone (ROCEPHIN) 2 g in sodium chloride 0.9 % 100 mL IVPB        2 g 200 mL/hr over 30 Minutes Intravenous  Once 12/22/22 1913 12/22/22 2058          Medications  apixaban  5 mg Oral BID   atorvastatin  40 mg Oral QHS   carvedilol  6.25 mg Oral BID   furosemide  60 mg Intravenous BID    tamsulosin  0.4 mg Oral Daily      Subjective:   [name] was seen and examined today.  Left hand and forearm swelling improved however has significant lower extremity swelling.  Decreased redness and ROM close to normal in the left forearm.  No chest pain, shortness of breath fevers or chills.  Objective:   Vitals:   12/24/22 1937 12/25/22 0500 12/25/22 0832 12/25/22 1243  BP: (!) 131/93  110/71 104/70  Pulse:   80 (!) 106  Resp: 18  20 16   Temp: 98.2 F (36.8 C)   99.4 F (37.4 C)  TempSrc: Oral   Oral  SpO2: 100%  98% 100%  Weight:  91 kg    Height:        Intake/Output Summary (Last 24 hours) at 12/25/2022 1429 Last data filed at 12/25/2022 1142 Gross per 24 hour  Intake 240 ml  Output 1300 ml  Net -1060 ml     Wt Readings from Last 3 Encounters:  12/25/22 91 kg  12/17/22 87.1 kg  12/07/22 87.1 kg    Physical Exam General: Alert and oriented x 3, NAD Cardiovascular: S1 S2 clear, RRR.  Respiratory: CTAB Gastrointestinal: Soft, nontender, nondistended, NBS Ext: 2+ pedal edema bilaterally Neuro: no new deficits Skin: Left forearm, hand redness, swelling improving no TTP Psych: Normal affect     Data Reviewed:  I have personally reviewed following labs    CBC Lab Results  Component Value Date   WBC 7.2 12/25/2022   RBC 3.76 (L) 12/25/2022   HGB 12.7 (L) 12/25/2022   HCT 39.7 12/25/2022   MCV 105.6 (H) 12/25/2022   MCH 33.8 12/25/2022   PLT 213 12/25/2022   MCHC 32.0 12/25/2022   RDW 14.1 12/25/2022   LYMPHSABS 1.1 12/22/2022   MONOABS 0.5 12/22/2022   EOSABS 0.6 (H) 12/22/2022   BASOSABS 0.0 12/22/2022     Last metabolic panel Lab Results  Component Value Date   NA 137 12/25/2022   K 4.1 12/25/2022   CL 106 12/25/2022   CO2 20 (L) 12/25/2022   BUN 38 (H) 12/25/2022   CREATININE 1.90 (H) 12/25/2022   GLUCOSE 92 12/25/2022   GFRNONAA 38 (L) 12/25/2022   GFRAA 26 (L) 04/17/2020   CALCIUM 9.0 12/25/2022   PHOS 3.2 07/31/2016   PROT  6.9 12/22/2022   ALBUMIN 3.8 12/22/2022   LABGLOB 2.9 05/11/2015   AGRATIO 1.0 05/11/2015   BILITOT 0.8  12/22/2022   ALKPHOS 86 12/22/2022   AST 21 12/22/2022   ALT 19 12/22/2022   ANIONGAP 11 12/25/2022    CBG (last 3)  No results for input(s): "GLUCAP" in the last 72 hours.    Coagulation Profile: No results for input(s): "INR", "PROTIME" in the last 168 hours.   Radiology Studies: I have personally reviewed the imaging studies  No results found.     Thad Ranger M.D. [facility] 12/25/2022, 2:29 PM  Available via Epic secure chat 7am-7pm After 7 pm, please refer to night coverage provider listed on amion.

## 2022-12-25 NOTE — Consult Note (Signed)
Reason for Consult Decompensated congestive heart failure Referring Physician: [facility]  [name] is an 67 y.o. male.  HPI: Patient patient is 67 year old male with past medical history significant for nonischemic congestive cardiomyopathy, history of non-Q wave MI in the setting of cocaine abuse in the past, history of recurrent congestive heart failure secondary to depressed LV systolic function, recently had 2D echo done which showed EF of 20 to 25% with severe MR and grade 2 diastolic dysfunction and RV apical thrombus, hypertension, chronic kidney disease stage 4, history of EtOH and cocaine abuse, hyperlipidemia, was admitted on 12/22/2022 because of progressive swelling and redness of left arm and hand and was treated for cellulitis as outpatient without improvement.  Patient also complains of leg swelling and also gives history of PND orthopnea.  Patient denies any chest pain nausea vomiting diaphoresis.  Denies palpitation lightheadedness or syncope states has been compliant with medications has quit drinking approximately 4 months ago.  Denies any recent cocaine abuse.  Denies any excessive salty food intake or excessive fluid intake.  Denies any fever or chills.  Patient was noted to have markedly elevated BNP.  EKG showed no new acute ischemic changes.  States since admission has left arm and hand and also leg swelling has improved after receiving increased doses of IV Lasix.  Of note his recent echo showed worsening LV systolic function and RV apical thrombus and worsening MR  Past Medical History:  Diagnosis Date   Anxiety    Arthritis    "all over" (01/07/2015)   CKD (chronic kidney disease), stage IV (HCC)    Depression    Headache    "probably weekly" (01/07/2015)   History of echocardiogram 12/2014   EF 20-25% -> improved up to EF 35 and 40% February 2020:   Homelessness    Hypercholesterolemia    Hypertension    Ischemic dilated cardiomyopathy (HCC)    Noncompliance     Nonischemic dilated cardiomyopathy (HCC) 07/2014   Initial EF was 2025% with global hypokinesis.  As of February 2020 EF up to 35-40%, GRII DD.  Global hypokinesis.  No WMA.  Relatively normal valves.   NSTEMI (non-ST elevated myocardial infarction) [facility]) 2016   2 separate episodes in January and June.  Normal coronary arteries by cardiac catheterization.  Thought to be related to.   Polysubstance abuse (HCC)    etoh, cocaine   Urinary hesitancy    Walking pneumonia 07/2014    Past Surgical History:  Procedure Laterality Date   LEFT HEART CATHETERIZATION WITH CORONARY ANGIOGRAM N/A 08/15/2014   Procedure: LEFT HEART CATHETERIZATION WITH CORONARY ANGIOGRAM;  Surgeon: Robynn Pane, MD;  Location: [facility] CATH LAB;  Service: Cardiovascular;; normal coronary arteries.  Severely elevated LVEDP of 35 mmHg.   TRANSTHORACIC ECHOCARDIOGRAM  08/2018   EF 35-40% (up from 20-25% in 2017).  No MR WMA.  Global HK.  GRII DD.  Normal valves.    History reviewed. No pertinent family history.  Social History:  reports that he quit smoking about 18 years ago. His smoking use included cigarettes. He has a 45.00 pack-year smoking history. He has quit using smokeless tobacco. He reports current alcohol use of about 2.0 - 4.0 standard drinks of alcohol per week. He reports current drug use. Drug: "Crack" cocaine.  Allergies:  Allergies  Allergen Reactions   Vancomycin Hives and Cough    And restlessness, agitation   Tape Rash    Please use paper tape    Medications: I have  reviewed the patient's current medications.  Results for orders placed or performed during the hospital encounter of 12/22/22 (from the past 48 hour(s))  CBC     Status: Abnormal   Collection Time: 12/24/22  4:53 AM  Result Value Ref Range   WBC 10.2 4.0 - 10.5 K/uL   RBC 3.54 (L) 4.22 - 5.81 MIL/uL   Hemoglobin 11.9 (L) 13.0 - 17.0 g/dL   HCT 16.1 (L) 09.6 - 04.5 %   MCV 104.2 (H) 80.0 - 100.0 fL   MCH 33.6 26.0 - 34.0 pg    MCHC 32.2 30.0 - 36.0 g/dL   RDW 40.9 81.1 - 91.4 %   Platelets 198 150 - 400 K/uL   nRBC 0.0 0.0 - 0.2 %    Comment: Performed at [facility], [address], [place], [place] [address]  Basic metabolic panel     Status: Abnormal   Collection Time: 12/24/22  4:53 AM  Result Value Ref Range   Sodium 136 135 - 145 mmol/L   Potassium 4.4 3.5 - 5.1 mmol/L   Chloride 105 98 - 111 mmol/L   CO2 20 (L) 22 - 32 mmol/L   Glucose, Bld 133 (H) 70 - 99 mg/dL    Comment: Glucose reference range applies only to samples taken after fasting for at least 8 hours.   BUN 33 (H) 8 - 23 mg/dL   Creatinine, Ser 6.21 (H) 0.61 - 1.24 mg/dL   Calcium 8.9 8.9 - 30.8 mg/dL   GFR, Estimated 35 (L) >60 mL/min    Comment: (NOTE) Calculated using the CKD-EPI Creatinine Equation (2021)    Anion gap 11 5 - 15    Comment: Performed at [facility], [address] [place], [place] [address]  CBC     Status: Abnormal   Collection Time: 12/25/22  4:45 AM  Result Value Ref Range   WBC 7.2 4.0 - 10.5 K/uL   RBC 3.76 (L) 4.22 - 5.81 MIL/uL   Hemoglobin 12.7 (L) 13.0 - 17.0 g/dL   HCT 69.6 29.5 - 28.4 %   MCV 105.6 (H) 80.0 - 100.0 fL   MCH 33.8 26.0 - 34.0 pg   MCHC 32.0 30.0 - 36.0 g/dL   RDW 13.2 44.0 - 10.2 %   Platelets 213 150 - 400 K/uL   nRBC 0.6 (H) 0.0 - 0.2 %    Comment: Performed at [facility], [address], [place], [place] [address]  Basic metabolic panel     Status: Abnormal   Collection Time: 12/25/22  4:45 AM  Result Value Ref Range   Sodium 137 135 - 145 mmol/L   Potassium 4.1 3.5 - 5.1 mmol/L   Chloride 106 98 - 111 mmol/L   CO2 20 (L) 22 - 32 mmol/L   Glucose, Bld 92 70 - 99 mg/dL    Comment: Glucose reference range applies only to samples taken after fasting for at least 8 hours.   BUN 38 (H) 8 - 23 mg/dL   Creatinine, Ser 6.44 (H) 0.61 - 1.24 mg/dL   Calcium 9.0 8.9 - 03.4 mg/dL   GFR, Estimated 38 (L) >60 mL/min     Comment: (NOTE) Calculated using the CKD-EPI Creatinine Equation (2021)    Anion gap 11 5 - 15    Comment: Performed at [facility], [address] [place], [place] [address]    No results found.  Review of Systems  Constitutional:  Negative for chills  and fever.  HENT:  Negative for sore throat.   Eyes:  Negative for discharge.  Respiratory:  Negative for shortness of breath and wheezing.   Cardiovascular:  Positive for leg swelling. Negative for chest pain and palpitations.  Gastrointestinal:  Negative for abdominal distention.  Genitourinary:  Negative for difficulty urinating.  Neurological:  Positive for dizziness and syncope.   Blood pressure 104/70, pulse (!) 106, temperature 99.4 F (37.4 C), temperature source Oral, resp. rate 16, height 5\' 8"  (1.727 m), weight 91 kg, SpO2 100 %. Physical Exam Constitutional:      Appearance: Normal appearance.  HENT:     Head: Normocephalic and atraumatic.  Eyes:     Pupils: Pupils are equal, round, and reactive to light.  Neck:     Comments: Positive JVD Cardiovascular:     Rate and Rhythm: Tachycardia present.     Heart sounds: Murmur (2/6 systolic murmur and S3 gallop noted) heard.  Pulmonary:     Comments: Decreased breath sounds at bases with faint rales noted Abdominal:     General: There is no distension.     Palpations: Abdomen is soft.     Tenderness: There is no abdominal tenderness.  Musculoskeletal:     Cervical back: Normal range of motion and neck supple.     Comments: No clubbing cyanosis 1+ edema noted in the legs  dorsum of left hand.  No obvious erythema noted  Neurological:     General: No focal deficit present.     Mental Status: He is alert and oriented to person, place, and time.     Assessment/Plan: Resolving acute on chronic systolic/diastolic congestive heart failure Minimally elevated high-sensitivity troponin I secondary to above Nonischemic dilated congestive cardiomyopathy  with RV apical thrombus Severe mitral regurgitation History of non-Q wave MI in the past in the setting of cocaine abuse in the past Hypertension Hyperlipidemia Resolving cellulitis left hand Acute on chronic kidney injury stage IV Plan Hold carvedilol for now and will restart once fully compensated Agree with increasing Lasix dose Will restart GDMT as blood pressure and renal function tolerates Restart Entresto and add Jardiance from tomorrow if renal function remains stable Restrict beta agonist With start cornalor if still remains tachycardic after being fully compensated and switch carvedilol to metoprolol succinate. Discussed with patient regarding compliance with diet, medications follow-up and complete cessation of alcohol and cocaine Strict I&O's/daily weights Consider advanced heart failure team consultation.  Rinaldo Cloud 12/25/2022, 4:30 PM

## 2022-12-26 ENCOUNTER — Inpatient Hospital Stay (HOSPITAL_COMMUNITY): Payer: 59

## 2022-12-26 DIAGNOSIS — L03114 Cellulitis of left upper limb: Secondary | ICD-10-CM | POA: Diagnosis not present

## 2022-12-26 DIAGNOSIS — N183 Chronic kidney disease, stage 3 unspecified: Secondary | ICD-10-CM | POA: Diagnosis not present

## 2022-12-26 DIAGNOSIS — I5042 Chronic combined systolic (congestive) and diastolic (congestive) heart failure: Secondary | ICD-10-CM | POA: Diagnosis not present

## 2022-12-26 LAB — RESPIRATORY PANEL BY PCR

## 2022-12-26 LAB — URINALYSIS, W/ REFLEX TO CULTURE (INFECTION SUSPECTED)
Bilirubin Urine: NEGATIVE
Glucose, UA: NEGATIVE mg/dL
Ketones, ur: NEGATIVE mg/dL
Leukocytes,Ua: NEGATIVE
Nitrite: NEGATIVE
Protein, ur: NEGATIVE mg/dL
Specific Gravity, Urine: 1.009 (ref 1.005–1.030)
pH: 5 (ref 5.0–8.0)

## 2022-12-26 LAB — HEPATIC FUNCTION PANEL
ALT: 20 U/L (ref 0–44)
AST: 42 U/L — ABNORMAL HIGH (ref 15–41)
Albumin: 3.4 g/dL — ABNORMAL LOW (ref 3.5–5.0)
Alkaline Phosphatase: 58 U/L (ref 38–126)
Bilirubin, Direct: 0.5 mg/dL — ABNORMAL HIGH (ref 0.0–0.2)
Indirect Bilirubin: 0.9 mg/dL (ref 0.3–0.9)
Total Bilirubin: 1.4 mg/dL — ABNORMAL HIGH (ref 0.3–1.2)
Total Protein: 6.4 g/dL — ABNORMAL LOW (ref 6.5–8.1)

## 2022-12-26 LAB — CBC WITH DIFFERENTIAL/PLATELET
Abs Immature Granulocytes: 0.12 10*3/uL — ABNORMAL HIGH (ref 0.00–0.07)
Basophils Absolute: 0.1 10*3/uL (ref 0.0–0.1)
Basophils Relative: 1 %
Eosinophils Absolute: 0.8 10*3/uL — ABNORMAL HIGH (ref 0.0–0.5)
Eosinophils Relative: 8 %
HCT: 48.1 % (ref 39.0–52.0)
Hemoglobin: 15.6 g/dL (ref 13.0–17.0)
Immature Granulocytes: 1 %
Lymphocytes Relative: 13 %
Lymphs Abs: 1.4 10*3/uL (ref 0.7–4.0)
MCH: 33.7 pg (ref 26.0–34.0)
MCHC: 32.4 g/dL (ref 30.0–36.0)
MCV: 103.9 fL — ABNORMAL HIGH (ref 80.0–100.0)
Monocytes Absolute: 0.3 10*3/uL (ref 0.1–1.0)
Monocytes Relative: 3 %
Neutro Abs: 8 10*3/uL — ABNORMAL HIGH (ref 1.7–7.7)
Neutrophils Relative %: 74 %
Platelets: 255 10*3/uL (ref 150–400)
RBC: 4.63 MIL/uL (ref 4.22–5.81)
RDW: 14.3 % (ref 11.5–15.5)
WBC: 10.7 10*3/uL — ABNORMAL HIGH (ref 4.0–10.5)
nRBC: 0.7 % — ABNORMAL HIGH (ref 0.0–0.2)

## 2022-12-26 LAB — BASIC METABOLIC PANEL
Anion gap: 13 (ref 5–15)
BUN: 40 mg/dL — ABNORMAL HIGH (ref 8–23)
CO2: 20 mmol/L — ABNORMAL LOW (ref 22–32)
Calcium: 8.9 mg/dL (ref 8.9–10.3)
Chloride: 105 mmol/L (ref 98–111)
Creatinine, Ser: 2.19 mg/dL — ABNORMAL HIGH (ref 0.61–1.24)
GFR, Estimated: 32 mL/min — ABNORMAL LOW (ref >60)
Glucose, Bld: 76 mg/dL (ref 70–99)
Potassium: 4.4 mmol/L (ref 3.5–5.1)
Sodium: 138 mmol/L (ref 135–145)

## 2022-12-26 LAB — CULTURE, BLOOD (ROUTINE X 2)

## 2022-12-26 LAB — PROCALCITONIN: Procalcitonin: 0.45 ng/mL

## 2022-12-26 LAB — RESP PANEL BY RT-PCR (RSV, FLU A&B, COVID)  RVPGX2
Influenza A by PCR: NEGATIVE
Influenza B by PCR: NEGATIVE
Resp Syncytial Virus by PCR: NEGATIVE
SARS Coronavirus 2 by RT PCR: NEGATIVE

## 2022-12-26 MED ORDER — METOPROLOL SUCCINATE ER 25 MG PO TB24: 12.5000 mg | ORAL_TABLET | Freq: Every day | ORAL | Status: DC

## 2022-12-26 MED ORDER — IBUPROFEN 200 MG PO TABS
400.0000 mg | ORAL_TABLET | Freq: Four times a day (QID) | ORAL | Status: DC | PRN
  Administered 2022-12-26: 400 mg via ORAL
  Filled 2022-12-26: qty 2

## 2022-12-26 MED ORDER — ACETAMINOPHEN 500 MG PO TABS
1000.0000 mg | ORAL_TABLET | Freq: Once | ORAL | Status: AC
  Administered 2022-12-26: 1000 mg via ORAL
  Filled 2022-12-26: qty 2

## 2022-12-26 MED ORDER — LINEZOLID 600 MG PO TABS
600.0000 mg | ORAL_TABLET | Freq: Two times a day (BID) | ORAL | Status: DC
  Administered 2022-12-26 (×2): 600 mg via ORAL
  Filled 2022-12-26 (×3): qty 1

## 2022-12-26 MED ORDER — FUROSEMIDE 10 MG/ML IJ SOLN
40.0000 mg | Freq: Two times a day (BID) | INTRAMUSCULAR | Status: DC
  Administered 2022-12-26: 40 mg via INTRAVENOUS
  Filled 2022-12-26: qty 4

## 2022-12-26 MED ORDER — SACUBITRIL-VALSARTAN 24-26 MG PO TABS
1.0000 | ORAL_TABLET | Freq: Two times a day (BID) | ORAL | Status: DC
  Administered 2022-12-26: 1 via ORAL
  Filled 2022-12-26 (×3): qty 1

## 2022-12-26 NOTE — Consult Note (Signed)
[facility]                                                                                        Patient Identification: Patient Name: [name] MRN: [id number] Admit Date: 12/22/2022  6:09 PM Today's Date: 12/26/2022 Reason for consult: fevers, cellulitis Requesting provider: Dr Isidoro Donning  Principal Problem:   Cellulitis of hand, left Active Problems:   CKD (chronic kidney disease) stage 3, GFR 30-59 ml/min (HCC)   Chronic combined systolic and diastolic congestive heart failure (HCC)   Antibiotics:  Vancomycin 6/6 Ceftriaxone 4/6-c  Lines/Hardware:  Assessment 67 year old male with PMH as below including CKD, CHF ( EF 20-25%)/nonischemic dilated cardiomyopathy,CAD/NSTEMI, polysubstance abuse, anxiety and depression, enterobacter cloacae bacteremia in 2016 admitted with   # Left hand/wrist cellulitis - seems to be clinically improving  # Fevers/oral blisters  ? HSV stomatitis, no ulcers Complains of diarrhea for a while  6/10 UA unremarkable   Recommendations  Continue ceftriaxone, will add linezolid pending blood cultures  Respiratory viral panel full, hepatic function panel added on Valacyclovir 1g po bid for 7-10 days for possible oral herpes.  Following   Rest of the management as per the primary team. Please call with questions or concerns.  Thank you for the consult  __________________________________________________________________________________________________________ HPI and Hospital Course: 67 year old male with PMH as below including CKD, CHF ( EF 20-25%)/nonischemic dilated cardiomyopathy,CAD/NSTEMI, polysubstance abuse, anxiety and depression, enterobacter cloacae bacteremia in 2016 who presented to the ED on 6/6 with persistent left hand swelling after being seen in the ED on 6/1 with left hand swelling, received IV ceftriaxone for cellulitis/analgesics, Xray left  wrist  Soft tissue swelling over the dorsum of the wrist and hand without underlying fracture or dislocation, was discharged on cefuroxime/analgesics.  Denies fever, chills.  Denies any known trauma or insect or animal bite or wounds.   At ED, T max 100.5 Labs remarkable for CR 1.71, WBC 5 Was given IV vancomycin and ceftriaxone VAS [place] left UE and Bilateral LE negative for DVT  Poor historian and difficult to get reliable h/o Complains of generalized pain and soreness all over the body including bilateral legs and feet. Left hand, wrist and forearm swelling improving  Blisters in his mouth esp at the roof of mouth since yesterday Complains of nausea, diarrhea ongoing for a while, not sure how long, denies blood. Denies vomiting, non specific abdominal pain  Denies GU symptoms, rashes  Denies any symptoms of phlebitis   ROS: General- Denies loss of appetite and loss of weight, fevers and chills + HEENT - Denies headache, blurry vision, neck pain, sinus pain Chest - Denies any chest pain, mild SOB and cough + CVS- Denies any dizziness/lightheadedness, syncopal attacks, palpitations Abdomen- Denies any  vomiting, hematochezia Neuro - Denies any weakness, numbness, tingling sensation Psych - Denies any changes in mood irritability or depressive symptoms GU- Denies any burning, dysuria, hematuria or increased frequency of urination Skin - blisters as above  MSK - denies localized any joint pain/swelling or restricted ROM   Past Medical History:  Diagnosis Date   Anxiety    Arthritis    "  all over" (01/07/2015)   CKD (chronic kidney disease), stage IV (HCC)    Depression    Headache    "probably weekly" (01/07/2015)   History of echocardiogram 12/2014   EF 20-25% -> improved up to EF 35 and 40% February 2020:   Homelessness    Hypercholesterolemia    Hypertension    Ischemic dilated cardiomyopathy (HCC)    Noncompliance    Nonischemic dilated cardiomyopathy (HCC) 07/2014   Initial  EF was 2025% with global hypokinesis.  As of February 2020 EF up to 35-40%, GRII DD.  Global hypokinesis.  No WMA.  Relatively normal valves.   NSTEMI (non-ST elevated myocardial infarction) [facility]) 2016   2 separate episodes in January and June.  Normal coronary arteries by cardiac catheterization.  Thought to be related to.   Polysubstance abuse (HCC)    etoh, cocaine   Urinary hesitancy    Walking pneumonia 07/2014   Past Surgical History:  Procedure Laterality Date   LEFT HEART CATHETERIZATION WITH CORONARY ANGIOGRAM N/A 08/15/2014   Procedure: LEFT HEART CATHETERIZATION WITH CORONARY ANGIOGRAM;  Surgeon: Robynn Pane, MD;  Location: [facility] CATH LAB;  Service: Cardiovascular;; normal coronary arteries.  Severely elevated LVEDP of 35 mmHg.   TRANSTHORACIC ECHOCARDIOGRAM  08/2018   EF 35-40% (up from 20-25% in 2017).  No MR WMA.  Global HK.  GRII DD.  Normal valves.    Scheduled Meds:  apixaban  5 mg Oral BID   atorvastatin  40 mg Oral QHS   carvedilol  6.25 mg Oral BID   furosemide  60 mg Intravenous BID   tamsulosin  0.4 mg Oral Daily   Continuous Infusions:  sodium chloride 10 mL/hr at 12/23/22 0104   cefTRIAXone (ROCEPHIN)  IV 2 g (12/25/22 2103)   PRN Meds:.sodium chloride, acetaminophen **OR** acetaminophen, albuterol, diphenhydrAMINE, hydrALAZINE, hydrocortisone cream, oxyCODONE  Allergies  Allergen Reactions   Vancomycin Hives and Cough    And restlessness, agitation   Tape Rash    Please use paper tape   Social History   Socioeconomic History   Marital status: Single    Spouse name: Not on file   Number of children: 0   Years of education: Not on file   Highest education level: 11th grade  Occupational History   Occupation: retired  Tobacco Use   Smoking status: Former    Packs/day: 1.50    Years: 30.00    Additional pack years: 0.00    Total pack years: 45.00    Types: Cigarettes    Quit date: 02/19/2004    Years since quitting: 18.8   Smokeless tobacco:  Former   Tobacco comments:    quit 2005  Vaping Use   Vaping Use: Never used  Substance and Sexual Activity   Alcohol use: Yes    Alcohol/week: 2.0 - 4.0 standard drinks of alcohol    Types: 2 - 4 Cans of beer per week    Comment: once weekly   Drug use: Yes    Types: "Crack" cocaine    Comment: 2-3 weeks ago   Sexual activity: Never    Comment: crack  Other Topics Concern   Not on file  Social History Narrative   Not on file   Social Determinants of Health   Financial Resource Strain: Low Risk  (11/18/2022)   Overall Financial Resource Strain (CARDIA)    Difficulty of Paying Living Expenses: Not very hard  Food Insecurity: No Food Insecurity (12/23/2022)   Hunger Vital Sign  Worried About Programme researcher, broadcasting/film/video in the Last Year: Never true    [employer] of Food in the Last Year: Never true  Transportation Needs: Unmet Transportation Needs (12/23/2022)   PRAPARE - Administrator, Civil Service (Medical): Yes    Lack of Transportation (Non-Medical): No  Physical Activity: Not on file  Stress: Not on file  Social Connections: Not on file  Intimate Partner Violence: Not At Risk (12/23/2022)   Humiliation, Afraid, Rape, and Kick questionnaire    Fear of Current or Ex-Partner: No    Emotionally Abused: No    Physically Abused: No    Sexually Abused: No   History reviewed. No pertinent family history.  Vitals BP (!) 111/90 (BP Location: Left Arm)   Pulse (!) 119   Temp (!) 101.4 F (38.6 C) (Oral)   Resp 18   Ht 5\' 8"  (1.727 m)   Wt 87.5 kg   SpO2 98%   BMI 29.33 kg/m    Physical Exam Constitutional:  adult male lying in the bed, appears comfortable     Comments: blisters in the roof of the mouth, no ulcers/bleeding   Cardiovascular:     Rate and Rhythm: Normal rate and regular rhythm.     Heart sounds: s1s2  Pulmonary:     Effort: Pulmonary effort is normal on room air     Comments: Normal lung sounds   Abdominal:     Palpations: Abdomen is soft.      Tenderness: non distended and non tender   Musculoskeletal:        General: bilateral leg swelling but no erythema/fluctuance/crepitus or heat. Left hand and wrist swelling seems to be improving with no erythema, warmth and tenderness, good rom in the left wrist and elbow.   Skin:    Comments: No rashes   Neurological:     General: awake, alert and oriented, following commands   Psychiatric:        Mood and Affect: Mood normal.    Pertinent Microbiology Results for orders placed or performed during the hospital encounter of 12/22/22  Culture, blood (routine x 2)     Status: None (Preliminary result)   Collection Time: 12/22/22  7:59 PM   Specimen: BLOOD  Result Value Ref Range Status   Specimen Description   Final    BLOOD RIGHT ANTECUBITAL Performed at [facility], [address], [place], [place] [address]    Special Requests   Final    BOTTLES DRAWN AEROBIC AND ANAEROBIC Blood Culture adequate volume Performed at [facility], [address], [place], [place] [address]    Culture   Final    NO GROWTH 4 DAYS Performed at [facility], [address], [place], [place] [address]    Report Status PENDING  Incomplete  Culture, blood (routine x 2)     Status: None (Preliminary result)   Collection Time: 12/22/22  8:22 PM   Specimen: BLOOD  Result Value Ref Range Status   Specimen Description   Final    BLOOD RIGHT ANTECUBITAL Performed at [facility], [address] [place], [place] [address]    Special Requests   Final    BOTTLES DRAWN AEROBIC AND ANAEROBIC Blood Culture adequate volume Performed at [facility], [address], [place], [place] [address]    Culture   Final    NO GROWTH 4 DAYS Performed at [facility], [address] [place], [place] [address]    Report Status PENDING  Incomplete   Pertinent Lab seen by me:    Latest Ref Rng & Units 12/25/2022    4:45 AM 12/24/2022     4:53 AM 12/23/2022    1:43 AM  CBC  WBC 4.0 - 10.5 K/uL 7.2  10.2  6.7   Hemoglobin 13.0 - 17.0 g/dL 19.1  47.8  29.5   Hematocrit 39.0 - 52.0 % 39.7  36.9  42.6   Platelets 150 - 400 K/uL 213  198  211       Latest Ref Rng & Units 12/26/2022    4:25 AM 12/25/2022    4:45 AM 12/24/2022    4:53 AM  CMP  Glucose 70 - 99 mg/dL 76  92  621   BUN 8 - 23 mg/dL 40  38  33   Creatinine 0.61 - 1.24 mg/dL 3.08  6.57  8.46   Sodium 135 - 145 mmol/L 138  137  136   Potassium 3.5 - 5.1 mmol/L 4.4  4.1  4.4   Chloride 98 - 111 mmol/L 105  106  105   CO2 22 - 32 mmol/L 20  20  20    Calcium 8.9 - 10.3 mg/dL 8.9  9.0  8.9      Pertinent Imagings/Other Imagings Plain films and CT images have been personally visualized and interpreted; radiology reports have been reviewed. Decision making incorporated into the Impression / Recommendations.  VAS [place] UPPER EXTREMITY VENOUS DUPLEX  Result Date: 12/24/2022 UPPER VENOUS STUDY  Patient Name:  EXTON SUHRE  Date of Exam:   12/23/2022 Medical Rec #: [id number]       Accession #:    [id number] Date of Birth: 02-20-1956      Patient Gender: M Patient Age:   67 years Exam Location:  [facility] Procedure:      VAS [place] UPPER EXTREMITY VENOUS DUPLEX Referring Phys: Lorin Glass --------------------------------------------------------------------------------  Indications: Swelling, Pain, and Erythema Comparison Study: No previous study. Performing Technologist: McKayla Maag RVT, [place]  Examination Guidelines: A complete evaluation includes B-mode imaging, spectral Doppler, color Doppler, and power Doppler as needed of all accessible portions of each vessel. Bilateral testing is considered an integral part of a complete examination. Limited examinations for reoccurring indications may be performed as noted.  Right Findings: +----------+------------+---------+-----------+----------+-------+ RIGHT     CompressiblePhasicitySpontaneousPropertiesSummary  +----------+------------+---------+-----------+----------+-------+ Subclavian    Full       Yes       Yes                      +----------+------------+---------+-----------+----------+-------+  Left Findings: +----------+------------+---------+-----------+----------+-------+ LEFT      CompressiblePhasicitySpontaneousPropertiesSummary +----------+------------+---------+-----------+----------+-------+ IJV           Full       Yes       Yes                      +----------+------------+---------+-----------+----------+-------+ Subclavian    Full       Yes       Yes                      +----------+------------+---------+-----------+----------+-------+ Axillary      Full       Yes       Yes                      +----------+------------+---------+-----------+----------+-------+ Brachial  Full       Yes       Yes                      +----------+------------+---------+-----------+----------+-------+ Radial        Full                                          +----------+------------+---------+-----------+----------+-------+ Ulnar         Full                                          +----------+------------+---------+-----------+----------+-------+ Cephalic      Full                                          +----------+------------+---------+-----------+----------+-------+ Basilic       Full                                          +----------+------------+---------+-----------+----------+-------+  Summary:  Right: No evidence of thrombosis in the subclavian.  Left: No evidence of deep vein thrombosis in the upper extremity. No evidence of superficial vein thrombosis in the upper extremity.  *See table(s) above for measurements and observations.  Diagnosing physician: Gerarda Fraction Electronically signed by Gerarda Fraction on 12/24/2022 at 9:35:15 AM.    Final    VAS [place] LOWER EXTREMITY VENOUS (DVT)  Result Date: 12/24/2022  Lower Venous DVT Study  Patient Name:  ROLEN OSE  Date of Exam:   12/23/2022 Medical Rec #: [id number]       Accession #:    [id number] Date of Birth: [birth year]      Patient Gender: M Patient Age:   84 years Exam Location:  [facility] Procedure:      VAS [place] LOWER EXTREMITY VENOUS (DVT) Referring Phys: RIPUDEEP RAI --------------------------------------------------------------------------------  Indications: Edema.  Comparison Study: No previous study. Performing Technologist: McKayla Maag RVT, [place]  Examination Guidelines: A complete evaluation includes B-mode imaging, spectral Doppler, color Doppler, and power Doppler as needed of all accessible portions of each vessel. Bilateral testing is considered an integral part of a complete examination. Limited examinations for reoccurring indications may be performed as noted. The reflux portion of the exam is performed with the patient in reverse Trendelenburg.  +---------+---------------+---------+-----------+----------+--------------+ RIGHT    CompressibilityPhasicitySpontaneityPropertiesThrombus Aging +---------+---------------+---------+-----------+----------+--------------+ CFV      Full           Yes      Yes                                 +---------+---------------+---------+-----------+----------+--------------+ SFJ      Full                                                        +---------+---------------+---------+-----------+----------+--------------+ FV Prox  Full                                                        +---------+---------------+---------+-----------+----------+--------------+  FV Mid   Full                                                        +---------+---------------+---------+-----------+----------+--------------+ FV DistalFull                                                        +---------+---------------+---------+-----------+----------+--------------+ PFV      Full                                                         +---------+---------------+---------+-----------+----------+--------------+ POP      Full           Yes      Yes                                 +---------+---------------+---------+-----------+----------+--------------+ PTV      Full                                                        +---------+---------------+---------+-----------+----------+--------------+ PERO     Full                                                        +---------+---------------+---------+-----------+----------+--------------+   +---------+---------------+---------+-----------+----------+--------------+ LEFT     CompressibilityPhasicitySpontaneityPropertiesThrombus Aging +---------+---------------+---------+-----------+----------+--------------+ CFV      Full           Yes      Yes                                 +---------+---------------+---------+-----------+----------+--------------+ SFJ      Full                                                        +---------+---------------+---------+-----------+----------+--------------+ FV Prox  Full                                                        +---------+---------------+---------+-----------+----------+--------------+ FV Mid   Full                                                        +---------+---------------+---------+-----------+----------+--------------+  FV DistalFull                                                        +---------+---------------+---------+-----------+----------+--------------+ PFV      Full                                                        +---------+---------------+---------+-----------+----------+--------------+ POP      Full           Yes      Yes                                 +---------+---------------+---------+-----------+----------+--------------+ PTV      Full                                                         +---------+---------------+---------+-----------+----------+--------------+ PERO     Full                                                        +---------+---------------+---------+-----------+----------+--------------+     Summary: BILATERAL: - No evidence of deep vein thrombosis seen in the lower extremities, bilaterally. - No evidence of superficial venous thrombosis in the lower extremities, bilaterally. -No evidence of popliteal cyst, bilaterally.   *See table(s) above for measurements and observations. Electronically signed by Gerarda Fraction on 12/24/2022 at 9:35:02 AM.    Final    DG Hand Complete Left  Result Date: 12/22/2022 CLINICAL DATA:  Swelling EXAM: LEFT HAND - COMPLETE 3+ VIEW COMPARISON:  Left wrist x-ray 12/20/2022 FINDINGS: There is marked soft tissue swelling of the hand and wrist, increased from prior. There is no radiopaque foreign body. There is no cortical erosion. There is no acute fracture or dislocation. IMPRESSION: Marked soft tissue swelling of the hand and wrist, increased from prior. No acute osseous abnormality. Electronically Signed   By: Darliss Cheney M.D.   On: 12/22/2022 19:58   DG Chest Portable 1 View  Result Date: 12/17/2022 CLINICAL DATA:  Chest pain EXAM: PORTABLE CHEST 1 VIEW COMPARISON:  Chest x-ray dated Nov 16, 2022 FINDINGS: Unchanged cardiomegaly. Lungs are clear. No evidence of pleural effusion or pneumothorax. Moderate degenerative changes of the bilateral shoulders. IMPRESSION: No active disease. Electronically Signed   By: Allegra Lai M.D.   On: 12/17/2022 13:37   DG Wrist Complete Left  Result Date: 12/17/2022 CLINICAL DATA:  Wrist pain, swelling, redness. EXAM: LEFT WRIST - COMPLETE 3 VIEW COMPARISON:  None Available. FINDINGS: Soft tissue swelling is present over the dorsum of the wrist and hand. No radiopaque foreign body is present. No acute or focal osseous abnormality is present. IMPRESSION: Soft tissue swelling over the dorsum of the wrist  and hand without underlying fracture or dislocation. Electronically Signed  By: Marin Roberts M.D.   On: 12/17/2022 13:07    I have personally spent 76 minutes involved in face-to-face and non-face-to-face activities for this patient on the day of the visit. Professional time spent includes the following activities: Preparing to see the patient (review of tests), Obtaining and/or reviewing separately obtained history (admission/discharge record), Performing a medically appropriate examination and/or evaluation , Ordering medications/tests/procedures, referring and communicating with other health care professionals, Documenting clinical information in the EMR, Independently interpreting results (not separately reported), Communicating results to the patient/family/caregiver, Counseling and educating the patient/family/caregiver and Care coordination (not separately reported).  Electronically signed by:   Plan d/w requesting provider as well as ID pharm D  Note: This document was prepared using dragon voice recognition software and may include unintentional dictation errors.   Odette Fraction, MD Infectious Disease Physician [facility] Pager: [phone]

## 2022-12-26 NOTE — Progress Notes (Signed)
Attempted to page on-call ID [id number]. Awaiting response. Temp 103.4 rectally 1 hour after Tylenol given. MD Rai made aware.

## 2022-12-26 NOTE — TOC Initial Note (Signed)
Transition of Care [facility]) - Initial/Assessment Note    Patient Details  Name: [name] MRN: [id number] Date of Birth: 25-Nov-1955  Transition of Care [facility]) CM/SW Contact:    Larrie Kass, LCSW Phone Number: 12/26/2022, 12:49 PM  Clinical Narrative:                 CSW spoke with pt regarding SDOH insecurities. Pt reports issues with transportation. Pt is already aware of Medicaid transport services. Pt reports he is working on setting this up. Pt reports needing assistance at time of d/c. CSW informed pt he can receive a bus pass at time of d/c.pt is agreeable to this. Pt has no DME needs. TOC to follow   Expected Discharge Plan: Home/Self Care Barriers to Discharge: Continued Medical Work up   Patient Goals and CMS Choice Patient states their goals for this hospitalization and ongoing recovery are:: retunr home          Expected Discharge Plan and Services In-house Referral: Clinical Social Work     Living arrangements for the past 2 months: Apartment                                      Prior Living Arrangements/Services Living arrangements for the past 2 months: Apartment Lives with:: Self Patient language and need for interpreter reviewed:: Yes Do you feel safe going back to the place where you live?: Yes      Need for Family Participation in Patient Care: No (Comment) Care giver support system in place?: No (comment) Current home services: DME Criminal Activity/Legal Involvement Pertinent to Current Situation/Hospitalization: No - Comment as needed  Activities of Daily Living Home Assistive Devices/Equipment: Eyeglasses, Cane (specify quad or straight) ADL Screening (condition at time of admission) Patient's cognitive ability adequate to safely complete daily activities?: Yes Is the patient deaf or have difficulty hearing?: No Does the patient have difficulty seeing, even when wearing glasses/contacts?: No Does the patient have difficulty  concentrating, remembering, or making decisions?: No Patient able to express need for assistance with ADLs?: Yes Does the patient have difficulty dressing or bathing?: No Independently performs ADLs?: Yes (appropriate for developmental age) Does the patient have difficulty walking or climbing stairs?: Yes Weakness of Legs: Left Weakness of Arms/Hands: Left  Permission Sought/Granted                  Emotional Assessment Appearance:: Appears stated age Attitude/Demeanor/Rapport: Gracious Affect (typically observed): Accepting Orientation: : Oriented to  Time, Oriented to Place, Oriented to Self, Oriented to Situation Alcohol / Substance Use: Illicit Drugs Psych Involvement: No (comment)  Admission diagnosis:  Cellulitis of left hand [L03.114] Cellulitis of hand, left [L03.114] Patient Active Problem List   Diagnosis Date Noted   Cellulitis of hand, left 12/22/2022   Acute kidney injury superimposed on chronic kidney disease (HCC) 11/16/2022   Acute metabolic encephalopathy 11/16/2022   Stroke-like symptoms 11/16/2022   Bradycardia 11/16/2022   Cocaine abuse (HCC) 11/16/2022   Non-ischemic cardiomyopathy (HCC) 11/21/2018   Syncope and collapse 09/06/2018   Neuropathy 03/03/2017   Lower urinary tract symptoms 01/06/2017   Acute on chronic systolic heart failure (HCC) 07/05/2016   Macrocytosis without anemia 07/05/2016   Hypotension 06/14/2016   Generalized anxiety disorder 06/13/2016   Dermatitis 01/27/2016   Troponin level elevated    CKD (chronic kidney disease) stage 3, GFR 30-59 ml/min (HCC) 05/03/2015   Chronic  combined systolic and diastolic congestive heart failure (HCC) 05/03/2015   Benign essential HTN 05/03/2015   Polysubstance abuse (HCC)    Noncompliance    Gouty arthritis of left great toe 03/24/2015   PCP:  Raymon Mutton., FNP Pharmacy:   [facility]- [place], [place] - [address] [place]  [address] Phone: [phone] Fax: [phone]  [facility] - [place], [place] - [address] [place] [address] Phone: [phone] Fax: [phone]  [facility] [address] [place] [address] Phone: [phone] Fax: [phone]     Social Determinants of Health (SDOH) Social History: SDOH Screenings   Food Insecurity: No Food Insecurity (12/23/2022)  Housing: Low Risk  (12/23/2022)  Transportation Needs: Unmet Transportation Needs (12/23/2022)  Utilities: Not At Risk (12/23/2022)  Alcohol Screen: Low Risk  (11/18/2022)  Depression (PHQ2-9): Low Risk  (09/20/2018)  Financial Resource Strain: Low Risk  (11/18/2022)  Tobacco Use: Medium Risk (12/23/2022)   SDOH Interventions:     Readmission Risk Interventions    11/17/2022    2:32 PM  Readmission Risk Prevention Plan  Transportation Screening Complete  PCP or Specialist Appt within 3-5 Days Complete  HRI or Home Care Consult Complete  Social Work Consult for Recovery Care Planning/Counseling Complete  Palliative Care Screening Not Applicable  Medication Review Oceanographer) Referral to Pharmacy

## 2022-12-26 NOTE — Progress Notes (Signed)
   12/26/22 0439  Assess: MEWS Score  Temp (!) 101.1 F (38.4 C)  BP 125/82  MAP (mmHg) 95  Pulse Rate (!) 114  Resp (!) 22  SpO2 98 %  Assess: MEWS Score  MEWS Temp 1  MEWS Systolic 0  MEWS Pulse 2  MEWS RR 1  MEWS LOC 0  MEWS Score 4  MEWS Score Color Red  Assess: if the MEWS score is Yellow or Red  Were vital signs taken at a resting state? Yes  Focused Assessment Change from prior assessment (see assessment flowsheet)  Does the patient meet 2 or more of the SIRS criteria? Yes  Does the patient have a confirmed or suspected source of infection? Yes  MEWS guidelines implemented  Yes, red  Treat  MEWS Interventions Considered administering scheduled or prn medications/treatments as ordered  Take Vital Signs  Increase Vital Sign Frequency  Red: Q1hr x2, continue Q4hrs until patient remains green for 12hrs  Escalate  MEWS: Escalate Red: Discuss with charge nurse and notify provider. Consider notifying RRT. If remains red for 2 hours consider need for higher level of care  Notify: Charge Nurse/RN  Name of Charge Nurse/RN Notified Lauren RN  Provider Notification  Provider Name/Title Chinita Greenland, NP  Date Provider Notified 12/26/22  Time Provider Notified [phone]  Method of Notification Page  Notification Reason Change in status  Provider response No new orders  Date of Provider Response 12/26/22  Time of Provider Response 0452  Assess: SIRS CRITERIA  SIRS Temperature  1  SIRS Pulse 1  SIRS Respirations  1  SIRS WBC 0  SIRS Score Sum  3

## 2022-12-26 NOTE — Progress Notes (Signed)
MD Rai aware of BP. Gave order to hold ordered metoprolol and Entresto. Cardiology Dr. Sharyn Lull also aware. Will continue top monitor.

## 2022-12-26 NOTE — Care Management Important Message (Signed)
Important Message  Patient Details IM Letter given. Name: [name] MRN: [id number] Date of Birth: [birth year]   Medicare Important Message Given:  Yes     Caren Macadam 12/26/2022, 12:58 PM

## 2022-12-26 NOTE — Progress Notes (Signed)
Patient has small, red/white blisters to inside roof and sides of his mouth. Per patient, this is new this AM. MD Rai made aware.

## 2022-12-26 NOTE — Progress Notes (Signed)
Subjective:  Patient denies any chest pain or shortness of breath and leg swelling markedly improved.  Diuresing well  Objective:  Vital Signs in the last 24 hours: Temp:  [99 F (37.2 C)-101.4 F (38.6 C)] 99.5 F (37.5 C) (06/10 1118) Pulse Rate:  [93-119] 93 (06/10 1118) Resp:  [16-24] 20 (06/10 1118) BP: (96-125)/(60-90) 97/60 (06/10 1118) SpO2:  [93 %-100 %] 99 % (06/10 1118) Weight:  [87.5 kg] 87.5 kg (06/10 0500)  Intake/Output from previous day: 06/09 0701 - 06/10 0700 In: 660 [P.O.:660] Out: 500 [Urine:500] Intake/Output from this shift: Total I/O In: 240 [P.O.:240] Out: 550 [Urine:550]  Physical Exam: Neck: moderate anterior cervical adenopathy, no adenopathy, no carotid bruit, no JVD, supple, symmetrical, trachea midline, and thyroid not enlarged, symmetric, no tenderness/mass/nodules Heart: regular rate and rhythm, S1, S2 normal, and 2/6 systolic murmur noted Abdomen: soft, non-tender; bowel sounds normal; no masses,  no organomegaly Extremities: no clubbing cyanosis trace edema lower extremities left forearm and dorsum of the hand edema persist no erythema noted  Lab Results: Recent Labs    12/25/22 0445 12/26/22 1046  WBC 7.2 10.7*  HGB 12.7* 15.6  PLT 213 255   Recent Labs    12/25/22 0445 12/26/22 0425  NA 137 138  K 4.1 4.4  CL 106 105  CO2 20* 20*  GLUCOSE 92 76  BUN 38* 40*  CREATININE 1.90* 2.19*   No results for input(s): "TROPONINI" in the last 72 hours.  Invalid input(s): "CK", "MB" Hepatic Function Panel No results for input(s): "PROT", "ALBUMIN", "AST", "ALT", "ALKPHOS", "BILITOT", "BILIDIR", "IBILI" in the last 72 hours. No results for input(s): "CHOL" in the last 72 hours. No results for input(s): "PROTIME" in the last 72 hours.  Imaging: Imaging results have been reviewed and No results found.  Cardiac Studies:  Assessment/Plan:  Compensated .chronic systolic/diastolic congestive heart failure Minimally elevated  high-sensitivity troponin I secondary to above Nonischemic dilated congestive cardiomyopathy with RV apical thrombus Severe mitral regurgitation History of non-Q wave MI in the past in the setting of cocaine abuse in the past Hypertension Hyperlipidemia Resolving cellulitis left hand Acute on chronic kidney injury stage IV Plan Reduce Lasix to 40 mg every 12 hours Start Entresto 24/26 mg twice daily Will switch carvedilol to metoprolol succinate and up titrate as blood pressure tolerates in view of tachycardia Patient is not a candidate for right heart catheter in view of right RV apical thrombus.  Patient will benefit from advanced heart failure team consult and can be followed up as outpatient. Add Aldactone and jardiance as blood pressure and renal function tolerates I will sign off please call if needed  LOS: 3 days    Rinaldo Cloud 12/26/2022, 11:28 AM

## 2022-12-26 NOTE — Progress Notes (Signed)
   12/26/22 1724  Vitals  Temp (!) 103.1 F (39.5 C)  Temp Source Oral  BP (!) 123/97  MAP (mmHg) 104  BP Location Left Arm  BP Method Automatic  Patient Position (if appropriate) Lying  Pulse Rate (!) 117  Pulse Rate Source Monitor  Resp 20  MEWS COLOR  MEWS Score Color Red  Oxygen Therapy  SpO2 99 %  O2 Device Room Air  MEWS Score  MEWS Temp 2  MEWS Systolic 0  MEWS Pulse 2  MEWS RR 0  MEWS LOC 0  MEWS Score 4   MD Rai made aware of VS. Rectal temp 103.9. Pt with tremors but no signs of distress. Tylenol given as ordered. Multiple ice packs applied to patient. Blankets removed and room temp turned down to assist with cooling patient. RR RN Britta Mccreedy notified to follow along with patient's status. ID MD paged to be made aware also. Will continue to assess and follow any new orders.   Side note- patient has only eaten 1 cup of pudding this shift after multiple attempts of encouragement from RN. RN encouraging pt to drink frequently, but pt c/o no appetite or desire.

## 2022-12-26 NOTE — Progress Notes (Signed)
[facility]                                                                              [name], is a 67 y.o. male, DOB - 10-05-1955, NFA:[id number] Admit date - 12/22/2022    Outpatient Primary MD for the patient is Joseph Blazing Marlynn Perking., FNP  LOS - 3  days  Chief Complaint  Patient presents with   Hand Pain       Brief summary   Patient is a 67 year old male with HTN, HLD, CAD, NSTEMI, nonischemic dilated cardiomyopathy, chronic systolic CHF (EF 20 to 25%), CKD 4, polysubstance abuse (cocaine, alcohol), anxiety, depression. 6/1, patient was seen in the ED with complaint of pain, redness and swelling of right hand for 2 days.  X-ray showed soft tissue swelling without osseous abnormality.  Patient was given 1 dose of IV Rocephin and discharged on oral cefuroxime and oxycodone. Patient returned back to the ED on 6/6 with worsening redness, pain in the left hand despite compliance with antibiotics.  Patient was noted to have left hand, forearm warm and swollen, decreased ROM left wrist, 1-2+ bilateral lower extremity edema, left worse than right  Afebrile, HR 109, BP 159/103 Left hand x-ray showed marked soft tissue swelling of the hand, wrist, increased from prior without acute osseous abnormality.   Assessment & Plan    Principal Problem:   Cellulitis of left hand -Venous Dopplers LUE negative for any DVT or SVT.   -Left hand forearm swelling improving -Currently on IV Rocephin 2 g IV daily -Since last night, spiking fevers, unclear etiology. Tmax 101.4 F this morning -White count slightly up to 10.7 today from 7.2 yesterday, procalcitonin 0.45.  UA negative for UTI.  Blood cultures repeated -Chest x-ray today showed no acute abnormality -ID consulted, will follow recommendations   Active Problems: Nonischemic cardiomyopathy, acute on chronic combined systolic and diastolic CHF exacerbation, severe mitral regurgitation -Bilateral lower extremity edema  noted, left worse than right -Venous Doppler lower extremities negative for DVT -Recent 2D echo 11/17/2022 had shown EF 20 to 25%, global hypokinesis, G2 DD, severe MR, apical thrombus (was discharged home on Lasix 40 mg twice a day on 5/5) -Lasix was increased to 60 mg IV twice daily on 6/9, with improvement in the lower extremity edema, however creatinine trended up to 2.19 -Negative balance of 1.2 L -Cardiology following, recommending reduce Lasix to 40 mg IV twice daily and start Entresto 24/26mg  twice daily -Coreg changed to Toprol-XL 12.5 mg daily and titrate up as tolerated with BP - CHF team consulted, discussed with Dr. Sharyn Lull, patient is not a candidate for right heart cath due to apical thrombus, recommended outpatient referral  Apical thrombus -Recently seen on the 2D echo on 11/17/2022, continue eliquis    CKD (chronic kidney disease) stage 3b, GFR 30-59 ml/min (HCC) -Baseline creatinine 1.9-2.3 -Creatinine trended up to 2.19, still at baseline, Lasix decreased to 40 mg twice daily  History of cocaine use -Counseled strongly on cocaine cessation -Urine drug screen was not obtained this admission  Essential hypertension -BP currently soft   Obesity Estimated body mass index is 29.33 kg/m  as calculated from the following:   Height as of this encounter: 5\' 8"  (1.727 m).   Weight as of this encounter: 87.5 kg.  Code Status: Full CODE STATUS DVT Prophylaxis:   apixaban (ELIQUIS) tablet 5 mg   Level of Care: Level of care: Telemetry Family Communication: Updated patient Disposition Plan:      Remains inpatient appropriate: Workup in progress   Procedures:  None  Consultants:   Cardiology Antimicrobials:   Anti-infectives (From admission, onward)    Start     Dose/Rate Route Frequency Ordered Stop   12/26/22 1415  linezolid (ZYVOX) tablet 600 mg        600 mg Oral Every 12 hours 12/26/22 1316     12/23/22 2000  cefTRIAXone (ROCEPHIN) 1 g in sodium chloride 0.9 %  100 mL IVPB  Status:  Discontinued        1 g 200 mL/hr over 30 Minutes Intravenous Every 24 hours 12/22/22 2224 12/23/22 0909   12/23/22 2000  cefTRIAXone (ROCEPHIN) 2 g in sodium chloride 0.9 % 100 mL IVPB        2 g 200 mL/hr over 30 Minutes Intravenous Every 24 hours 12/23/22 0909 12/30/22 1959   12/22/22 1945  vancomycin (VANCOREADY) IVPB 1750 mg/350 mL        1,750 mg 175 mL/hr over 120 Minutes Intravenous  Once 12/22/22 1941 12/22/22 2251   12/22/22 1915  cefTRIAXone (ROCEPHIN) 2 g in sodium chloride 0.9 % 100 mL IVPB        2 g 200 mL/hr over 30 Minutes Intravenous  Once 12/22/22 1913 12/22/22 2058          Medications  apixaban  5 mg Oral BID   atorvastatin  40 mg Oral QHS   furosemide  40 mg Intravenous BID   linezolid  600 mg Oral Q12H   metoprolol succinate  12.5 mg Oral Daily   sacubitril-valsartan  1 tablet Oral BID   tamsulosin  0.4 mg Oral Daily      Subjective:   [name] was seen and examined today.  Left hand, forearm swelling improving, still has some left hand edema.  Having fevers and chills, Tmax during my encounter 101.4 F.  No nausea vomiting, abdominal pain or diarrhea. No cough..  Objective:   Vitals:   12/26/22 0628 12/26/22 0959 12/26/22 1118 12/26/22 1239  BP: 105/72 (!) 111/90 97/60 101/61  Pulse: (!) 103 (!) 119 93 89  Resp: (!) 22 18 20    Temp: 99 F (37.2 C) (!) 101.4 F (38.6 C) 99.5 F (37.5 C)   TempSrc: Oral Oral Oral   SpO2: 97% 98% 99%   Weight:      Height:        Intake/Output Summary (Last 24 hours) at 12/26/2022 1359 Last data filed at 12/26/2022 1043 Gross per 24 hour  Intake 600 ml  Output 550 ml  Net 50 ml     Wt Readings from Last 3 Encounters:  12/26/22 87.5 kg  12/17/22 87.1 kg  12/07/22 87.1 kg   Physical Exam General: Alert and oriented x 3, NAD Cardiovascular: S1 S2 clear, RRR.  Respiratory: Diminished BS at the bases otherwise clear, no wheezing Gastrointestinal: Soft, nontender,  nondistended, NBS Ext: 1+ lower extremity edema Neuro: no new deficits Skin: Left forearm swelling is improving, still has left hand edema. Psych: Normal affect    Data Reviewed:  I have personally reviewed following labs    CBC Lab Results  Component Value Date  WBC 10.7 (H) 12/26/2022   RBC 4.63 12/26/2022   HGB 15.6 12/26/2022   HCT 48.1 12/26/2022   MCV 103.9 (H) 12/26/2022   MCH 33.7 12/26/2022   PLT 255 12/26/2022   MCHC 32.4 12/26/2022   RDW 14.3 12/26/2022   LYMPHSABS 1.4 12/26/2022   MONOABS 0.3 12/26/2022   EOSABS 0.8 (H) 12/26/2022   BASOSABS 0.1 12/26/2022     Last metabolic panel Lab Results  Component Value Date   NA 138 12/26/2022   K 4.4 12/26/2022   CL 105 12/26/2022   CO2 20 (L) 12/26/2022   BUN 40 (H) 12/26/2022   CREATININE 2.19 (H) 12/26/2022   GLUCOSE 76 12/26/2022   GFRNONAA 32 (L) 12/26/2022   GFRAA 26 (L) 04/17/2020   CALCIUM 8.9 12/26/2022   PHOS 3.2 07/31/2016   PROT 6.9 12/22/2022   ALBUMIN 3.8 12/22/2022   LABGLOB 2.9 05/11/2015   AGRATIO 1.0 05/11/2015   BILITOT 0.8 12/22/2022   ALKPHOS 86 12/22/2022   AST 21 12/22/2022   ALT 19 12/22/2022   ANIONGAP 13 12/26/2022    CBG (last 3)  No results for input(s): "GLUCAP" in the last 72 hours.    Coagulation Profile: No results for input(s): "INR", "PROTIME" in the last 168 hours.   Radiology Studies: I have personally reviewed the imaging studies  DG CHEST PORT 1 VIEW  Result Date: 12/26/2022 CLINICAL DATA:  Fevers EXAM: PORTABLE CHEST 1 VIEW COMPARISON:  12/17/2022 FINDINGS: Cardiac shadow is enlarged but accentuated by the portable technique. Lungs are clear bilaterally. No bony abnormality is noted. IMPRESSION: No acute abnormality noted. Electronically Signed   By: Alcide Clever M.D.   On: 12/26/2022 11:48       Manju Kulkarni M.D. [facility] 12/26/2022, 1:59 PM  Available via Epic secure chat 7am-7pm After 7 pm, please refer to night coverage provider listed on  amion.

## 2022-12-27 ENCOUNTER — Other Ambulatory Visit: Payer: Self-pay | Admitting: General Surgery

## 2022-12-27 DIAGNOSIS — L139 Bullous disorder, unspecified: Secondary | ICD-10-CM

## 2022-12-27 DIAGNOSIS — I5042 Chronic combined systolic (congestive) and diastolic (congestive) heart failure: Secondary | ICD-10-CM | POA: Diagnosis not present

## 2022-12-27 DIAGNOSIS — L03114 Cellulitis of left upper limb: Secondary | ICD-10-CM | POA: Diagnosis not present

## 2022-12-27 DIAGNOSIS — N183 Chronic kidney disease, stage 3 unspecified: Secondary | ICD-10-CM | POA: Diagnosis not present

## 2022-12-27 LAB — BASIC METABOLIC PANEL
Anion gap: 13 (ref 5–15)
BUN: 50 mg/dL — ABNORMAL HIGH (ref 8–23)
CO2: 20 mmol/L — ABNORMAL LOW (ref 22–32)
Calcium: 8.2 mg/dL — ABNORMAL LOW (ref 8.9–10.3)
Chloride: 102 mmol/L (ref 98–111)
Creatinine, Ser: 2.41 mg/dL — ABNORMAL HIGH (ref 0.61–1.24)
GFR, Estimated: 29 mL/min — ABNORMAL LOW (ref >60)
Glucose, Bld: 112 mg/dL — ABNORMAL HIGH (ref 70–99)
Potassium: 4.2 mmol/L (ref 3.5–5.1)
Sodium: 135 mmol/L (ref 135–145)

## 2022-12-27 LAB — CBC
HCT: 47.9 % (ref 39.0–52.0)
Hemoglobin: 15.8 g/dL (ref 13.0–17.0)
MCH: 33.8 pg (ref 26.0–34.0)
MCHC: 33 g/dL (ref 30.0–36.0)
MCV: 102.6 fL — ABNORMAL HIGH (ref 80.0–100.0)
Platelets: 192 10*3/uL (ref 150–400)
RBC: 4.67 MIL/uL (ref 4.22–5.81)
RDW: 14.2 % (ref 11.5–15.5)
WBC: 8.1 10*3/uL (ref 4.0–10.5)
nRBC: 0.4 % — ABNORMAL HIGH (ref 0.0–0.2)

## 2022-12-27 LAB — MRSA NEXT GEN BY PCR, NASAL: MRSA by PCR Next Gen: NOT DETECTED

## 2022-12-27 LAB — CULTURE, BLOOD (ROUTINE X 2): Culture: NO GROWTH

## 2022-12-27 MED ORDER — MIDODRINE HCL 5 MG PO TABS
10.0000 mg | ORAL_TABLET | Freq: Three times a day (TID) | ORAL | Status: DC
  Administered 2022-12-27: 10 mg via ORAL
  Filled 2022-12-27: qty 2

## 2022-12-27 MED ORDER — MAGIC MOUTHWASH
5.0000 mL | Freq: Four times a day (QID) | ORAL | Status: DC
  Administered 2022-12-27 (×3): 5 mL via ORAL
  Filled 2022-12-27 (×4): qty 5

## 2022-12-27 MED ORDER — FUROSEMIDE 10 MG/ML IJ SOLN: 40.0000 mg | Freq: Every day | INTRAMUSCULAR | Status: DC

## 2022-12-27 MED ORDER — METHYLPREDNISOLONE SODIUM SUCC 40 MG IJ SOLR
40.0000 mg | Freq: Three times a day (TID) | INTRAMUSCULAR | Status: DC
  Administered 2022-12-27 (×2): 40 mg via INTRAVENOUS
  Filled 2022-12-27 (×2): qty 1

## 2022-12-27 MED ORDER — POLYVINYL ALCOHOL 1.4 % OP SOLN
1.0000 [drp] | OPHTHALMIC | Status: DC | PRN
  Administered 2022-12-27: 1 [drp] via OPHTHALMIC
  Filled 2022-12-27: qty 15

## 2022-12-27 MED ORDER — MAGIC MOUTHWASH
5.0000 mL | Freq: Three times a day (TID) | ORAL | Status: DC
  Administered 2022-12-27: 5 mL via ORAL
  Filled 2022-12-27 (×2): qty 5

## 2022-12-27 MED ORDER — ARTIFICIAL TEARS OPHTHALMIC OINT
TOPICAL_OINTMENT | Freq: Every day | OPHTHALMIC | Status: DC
  Filled 2022-12-27: qty 3.5

## 2022-12-27 MED ORDER — METHYLPREDNISOLONE SODIUM SUCC 125 MG IJ SOLR
125.0000 mg | Freq: Once | INTRAMUSCULAR | Status: AC
  Administered 2022-12-27: 125 mg via INTRAVENOUS
  Filled 2022-12-27: qty 2

## 2022-12-27 MED ORDER — SODIUM CHLORIDE 0.9 % IV BOLUS
500.0000 mL | Freq: Once | INTRAVENOUS | Status: AC
  Administered 2022-12-27: 500 mL via INTRAVENOUS

## 2022-12-27 MED ORDER — TRAMADOL HCL 50 MG PO TABS
25.0000 mg | ORAL_TABLET | Freq: Once | ORAL | Status: AC
  Administered 2022-12-27: 25 mg via ORAL
  Filled 2022-12-27: qty 1

## 2022-12-27 MED ORDER — HYDROMORPHONE HCL 1 MG/ML IJ SOLN
1.0000 mg | INTRAMUSCULAR | Status: DC | PRN
  Administered 2022-12-27 (×3): 1 mg via INTRAVENOUS
  Filled 2022-12-27 (×3): qty 1

## 2022-12-27 MED ORDER — SALINE SPRAY 0.65 % NA SOLN
1.0000 | NASAL | Status: DC | PRN
  Administered 2022-12-27: 1 via NASAL
  Filled 2022-12-27: qty 44

## 2022-12-27 NOTE — Progress Notes (Signed)
Spoke with [facility] patient placement coordinator and reviewed patient's status, as well as current Vitals. Patient placement at [facility] confirmed that patient is going to require an ICU bed and is on the wait list currently pending transfer. Unable to provide an estimated time for transfer at this time, but stated they will contact us with updates when a Bed becomes available.   Patient resting in bed at this time. Disposition is pleasant and his biggest concern right now are the discomfort in his mouth, and his lips. Urinal and PO fluids in reach. Call light in reach. Bed in low position, and bed alarm on.

## 2022-12-27 NOTE — Progress Notes (Addendum)
Consult Note (Consideration for Transfer)    NAME:  [name], MRN:  [id number], DOB:  09/11/1955, LOS: 4 ADMISSION DATE:  12/22/2022,    Current DATE:  12/27/22 REFERRING :  Johann Capers [facility] CHIEF COMPLAINT:  Possible Reaction to Medication  (potentially worsening)    Photos are available in media at the approximate time of solumedrol administration.   Brief History   67 year old male with hypertension, hyper lipid, CAD, NSTEMI nonischemic dilated cardiomyopathy, chronic systolic CHF(EF 20 to 25%), CKD 4, polysubstance abuse(cocaine, EtOH) anxiety, depression.  History of present illness   6 1 patient was seen in the ER with complaint of pain, redness and swelling of the right hand for 2 days soft tissue swelling without osseous abnormality confirmed by x-ray. Patient was given a dose of Rocephin IV and discharged with cefuroxime and oxycodone. Patient return to the ER on 6 6 with worsening redness, pain in the left hand. Patient was found to have left hand, forearm both warm and swollen with decreased range of motion of the left wrist, 1-2+ bilateral lower extremity edema left worse than right.   Significant Hospital Events   Overnight 12/27/2022-notified by RN for concern of increased swelling spread to trunk chest and blistering noticed on abdomen and chest. Swelling to eyes/neck noted No airway compromise at this time Patient is awake and oriented conversation  Notified attending with concerns and requested bedside visit which is now completed.  Consults:  Notifying local burn units for possible transfer.  Will not transfer w/o biopsy confirmation  Procedures:  Solu-Medrol 125 mg IV x 1 QTc prolonged  Antimicrobials:  Rocephin 2 g IV every 24 hours Zyvox tablet 600 mg every 12 hours  Both on hold currently    Objective   Blood pressure (!) 92/59, pulse 88, temperature 100.1 F (37.8 C), temperature source Rectal, resp. rate 18, height 5\' 8"  (1.727 m), weight 87.5 kg, SpO2 97 %.        Intake/Output Summary (Last 24 hours) at 12/27/2022 0204 Last data filed at 12/26/2022 1756 Gross per 24 hour  Intake 240 ml  Output 750 ml  Net -510 ml   Filed Weights   12/24/22 0511 12/25/22 0500 12/26/22 0500  Weight: 94.5 kg 91 kg 87.5 kg    Examination: General: alert/oriented x3 HENT: Swelling around eyes Lungs: CTAB, normal work of breathing, airway clear, resolving ulcerations in the mouth from previous Cardiovascular: RRR, no murmur, no JVD  Abdomen: soft, nontender, normal bowel sounds, blistering is seen Extremities: warm, well-perfused without cyanosis, redness and edema present on admission with some blistering Neuro:  grossly nonfocal, follows commands   Assessment & Plan:  Contact to outside facilities for possible transfer  Consideration for Stevens-Mandato syndrome Consideration for Toxic Epidermal Necrolysis Consideration for Severe allergic reaction  Best practice:  Diet: Cardiac Diet Pain/Anxiety/Delirium protocol (if indicated): Pain DVT prophylaxis: Eliquis Mobility: As tolerated Code Status: Full Family Communication: By nursing staff and patient Disposition:  Pending  Labs   CBC: Recent Labs  Lab 12/22/22 2027 12/23/22 0143 12/24/22 0453 12/25/22 0445 12/26/22 1046  WBC 5.0 6.7 10.2 7.2 10.7*  NEUTROABS 2.7  --   --   --  8.0*  HGB 13.3 13.3 11.9* 12.7* 15.6  HCT 42.1 42.6 36.9* 39.7 48.1  MCV 106.9* 107.3* 104.2* 105.6* 103.9*  PLT 211 211 198 213 255    Basic Metabolic Panel: Recent Labs  Lab 12/22/22 2027 12/23/22 0143 12/24/22 0453 12/25/22 0445 12/26/22 0425  NA 139 138 136 137 138  K 4.8 4.5 4.4 4.1 4.4  CL 107 107 105 106 105  CO2 20* 20* 20* 20* 20*  GLUCOSE 79 88 133* 92 76  BUN 21 22 33* 38* 40*  CREATININE 1.71* 1.78* 2.03* 1.90*  2.19*  CALCIUM 9.5 9.0 8.9 9.0 8.9  MG  --  2.1  --   --   --    GFR: Estimated Creatinine Clearance: 34.7 mL/min (A) (by C-G formula based on SCr of 2.19 mg/dL (H)). Recent Labs  Lab 12/22/22 2028 12/23/22 0142 12/23/22 0143 12/24/22 0453 12/25/22 0445 12/26/22 0425 12/26/22 1046  PROCALCITON  --  0.13  --   --   --  0.45  --   WBC  --   --  6.7 10.2 7.2  --  10.7*  LATICACIDVEN 1.4  --   --   --   --   --   --     Liver Function Tests: Recent Labs  Lab 12/22/22 2027 12/26/22 1607  AST 21 42*  ALT 19 20  ALKPHOS 86 58  BILITOT 0.8 1.4*  PROT 6.9 6.4*  ALBUMIN 3.8 3.4*   No results for input(s): "LIPASE", "AMYLASE" in the last 168 hours. No results for input(s): "AMMONIA" in the last 168 hours.  ABG    Component Value Date/Time   PHART 7.427 05/16/2015 1821   PCO2ART 34.5 (L) 05/16/2015 1821   PO2ART 122 (H) 05/16/2015 1821   HCO3 22.7 05/16/2015 1821   TCO2 25 06/14/2016 1706   ACIDBASEDEF 1.1 05/16/2015 1821   O2SAT 98.7 05/16/2015 1821     Coagulation Profile: No results for input(s): "INR", "PROTIME" in the last 168 hours.  Cardiac Enzymes: No results for input(s): "CKTOTAL", "CKMB", "CKMBINDEX", "TROPONINI" in the last 168 hours.  HbA1C: Hgb A1c MFr Bld  Date/Time Value Ref Range Status  08/14/2014 06:56 PM 5.4 4.8 - 5.6 % Final    Comment:    (NOTE)         Pre-diabetes: 5.7 - 6.4         Diabetes: >6.4         Glycemic control for adults with diabetes: <7.0   08/13/2014 06:43 PM 5.4 4.8 - 5.6 % Final    Comment:    (NOTE)         Pre-diabetes: 5.7 - 6.4         Diabetes: >6.4         Glycemic control for adults with diabetes: <7.0     CBG: No results for input(s): "GLUCAP" in the last 168 hours.  Past Medical History  He,  has a past medical history of Anxiety, Arthritis, CKD (chronic kidney disease), stage IV (HCC), Depression, Headache, History of echocardiogram (12/2014), Homelessness, Hypercholesterolemia, Hypertension,  Ischemic dilated cardiomyopathy (HCC), Noncompliance, Nonischemic dilated cardiomyopathy (HCC) (07/2014), NSTEMI (non-ST elevated myocardial infarction) (HCC) (2016), Polysubstance abuse (HCC), Urinary hesitancy, and Walking pneumonia (07/2014).   Surgical History    Past Surgical History:  Procedure Laterality Date   LEFT HEART CATHETERIZATION WITH CORONARY ANGIOGRAM N/A 08/15/2014   Procedure: LEFT HEART CATHETERIZATION WITH CORONARY ANGIOGRAM;  Surgeon: Robynn Pane, MD;  Location: [facility] CATH LAB;  Service: Cardiovascular;; normal coronary arteries.  Severely elevated LVEDP of 35 mmHg.   TRANSTHORACIC ECHOCARDIOGRAM  08/2018   EF 35-40% (up from 20-25% in 2017).  No MR WMA.  Global HK.  GRII DD.  Normal valves.     Social History   reports that he quit smoking about 18 years ago. His smoking use included cigarettes. He has a 45.00 pack-year smoking history. He has quit using smokeless tobacco. He reports current alcohol use of about 2.0 - 4.0 standard drinks of alcohol per week. He reports current drug use. Drug: "Crack" cocaine.   Family History   His family history is not on file.   Allergies Allergies  Allergen Reactions   Vancomycin Hives and Cough    And restlessness, agitation   Tape Rash    Please use paper tape     Home Medications  Prior to Admission medications   Medication Sig Start Date End Date Taking? Authorizing Provider  acetaminophen (TYLENOL) 325 MG tablet Take 2 tablets (650 mg total) by mouth every 6 (six) hours as needed. Patient taking differently: Take 650 mg by mouth every 6 (six) hours as needed for moderate pain. 12/17/22  Yes Tanda Rockers A, DO  albuterol (VENTOLIN HFA) 108 (90 Base) MCG/ACT inhaler Inhale 2 puffs into the lungs every 4 (four) hours as needed for wheezing or shortness of breath. 08/31/22  Yes Horton, Mayer Masker, MD  apixaban (ELIQUIS) 5 MG TABS tablet Take 1 tablet (5 mg total) by mouth 2 (two) times daily. 12/07/22  Yes Andrey Farmer,  PA-C  atorvastatin (LIPITOR) 40 MG tablet Take 40 mg by mouth at bedtime. 08/25/22  Yes [provider]  carvedilol (COREG) 6.25 MG tablet Take 1 tablet (6.25 mg total) by mouth 2 (two) times daily. 12/07/22  Yes Andrey Farmer, PA-C  clotrimazole-betamethasone (LOTRISONE) cream Apply 1 application topically 2 (two) times daily. 02/04/19  Yes Hoy Register, MD  dapagliflozin propanediol (FARXIGA) 10 MG TABS tablet Take 1 tablet (10 mg total) by mouth daily before breakfast. 12/07/22  Yes Andrey Farmer, PA-C  famotidine (PEPCID) 20 MG tablet Take 20 mg by mouth 2 (two) times daily. 11/08/22  Yes [provider]  furosemide (LASIX) 80 MG tablet Take 0.5 tablets (40 mg total) by mouth 2 (two) times daily. 12/07/22  Yes Andrey Farmer, PA-C  gabapentin (NEURONTIN) 600 MG tablet Take 600 mg by mouth 2 (two) times daily. 11/07/22  Yes [provider]  montelukast (SINGULAIR) 10 MG tablet Take 10 mg by mouth at bedtime. 08/27/22  Yes [provider]  nitroGLYCERIN (NITROSTAT) 0.4 MG SL tablet Place 1 tablet (0.4 mg total) under the tongue every 5 (five) minutes x 3 doses as needed for chest pain. 01/06/17  Yes Newlin, Odette Horns, MD  oxyCODONE (ROXICODONE) 5 MG immediate release tablet Take 1 tablet (5 mg total) by mouth every 6 (six) hours as  needed for severe pain. 12/17/22  Yes Tanda Rockers A, DO  potassium chloride SA (K-DUR,KLOR-CON) 20 MEQ tablet Take 20 mEq by mouth daily. 06/04/16  Yes [provider]  sacubitril-valsartan (ENTRESTO) 49-51 MG Take 1 tablet by mouth 2 (two) times daily.   Yes [provider]  tamsulosin (FLOMAX) 0.4 MG CAPS capsule Take 0.4 mg by mouth daily. 11/07/22  Yes [provider]  tiZANidine (ZANAFLEX) 4 MG tablet Take 1 tablet (4 mg total) by mouth every 6 (six) hours as needed for muscle spasms. MUST MAKE APPT FOR FURTHER REFILLS 02/22/19  Yes Hoy Register, MD  Misc. Devices MISC Blood pressure  monitor Dx: Hypertension, CHF 11/01/18   Hoy Register, MD    Update: 0335 hrs Possible slight improvement of facial swelling after solumedrol dose.   Update   0348 hrs Marked improvement in facial swelling. Patient is now in SDU/ICU for monitoring in consideration of potential for worsening.     Update    0430 Hrs  Currently in SDU  HR 78 RR  14 117/50 (73) SPO2 97%  Update  0611 Hrs  QTc monitoring is in use , Current QTc is 590 +/-  Protective precautions in use.  Antibiotics are discontinued for now.  Curbside recommendation from [facility] - ordered.  Chinita Greenland MSNA MSN [facility] Acute Care Nurse Practitioner [facility]

## 2022-12-27 NOTE — Procedures (Signed)
Procedures * No surgery found *  9:32 AM  PATIENT:  [name]  67 y.o. male  PRE-OPERATIVE DIAGNOSIS:  rash  POST-OPERATIVE DIAGNOSIS:  rash  PROCEDURE:  skin punch biopsy of abdominal wall  SURGEON:  Barnetta Chapel, PA-C  ASSISTANTS: none   ANESTHESIA:   local  LOCAL MEDICATIONS USED:  LIDOCAINE  and Amount: 5 ml  SPECIMEN:  Biopsy / Limited Resection  DISPOSITION OF SPECIMEN:  PATHOLOGY  INDICATION FOR PROCEDURE: This is a patient being treated for hand cellulitis who was started on Linezolid yesterday and developed a significant full body response of erythema, skin sloughing, and bullae.  Request is made for punch biopsy.  PROCEDURE: The procedure was explained the patient along with risks and complications.  Written consent was obtained.  The abdominal wall was prepped with betadine and anesthetized with 5cc of 1% lidocaine.  A 4mm punch biopsy was then used to obtain the specimen.  Pressure was held for hemostasis.  A clean dressing was placed over this site.  The specimen was personally taken to pathology with a rush order placed.  The patient tolerated the patient well.  PATIENT DISPOSITION:  ICU - extubated and stable.  Letha Cape 9:36 AM 12/27/2022

## 2022-12-27 NOTE — Progress Notes (Signed)
[facility]Link Physician-Brief Progress Note Patient Name: [name] DOB: 1955-07-20 MRN: [id number]   Date of Service  12/27/2022  HPI/Events of Note  67 year old male with a history of essential hypertension, dyslipidemia, coronary artery disease NSTEMI dilated cardiomyopathy and polysubstance abuse.  Initially brought in with right hand erythema and cellulitis.  Initially given Rocephin and discharge but returned with worsening redness in the left hand.  Transferred to the ICU on 6/11 with increased swelling with spread to the trunk/chest/blistering on the abdomen and chest.  Unable to visualize nasal patient is covering himself from head to toe, but he is received Solu-Medrol, Benadryl, and swelling seems to be improving.  Not scheduled for any further antibiotics today.  eICU Interventions  Could consider scheduled steroids Methylpred 40 3 times daily.  QT prolongation is equivocal in the setting of widened QRS complex-would not hold famotidine for this.  Continue with scheduled Benadryl.      Intervention Category Minor Interventions: Clinical assessment - ordering diagnostic tests  Ahtziry Saathoff 12/27/2022, 5:31 AM

## 2022-12-27 NOTE — Progress Notes (Signed)
[facility] Infectious Diseases Follow Up Note  Patient Identification: Patient Name: [name] MRN: [id number] Admit Date: 12/22/2022  6:09 PM Age: 67 y.o.Today's Date: 12/27/2022  Reason for Visit: generalized bullae  Principal Problem:   Cellulitis of hand, left Active Problems:   CKD (chronic kidney disease) stage 3, GFR 30-59 ml/min (HCC)   Chronic combined systolic and diastolic congestive heart failure (HCC)  Antibiotics:  Vancomycin 6/6 Ceftriaxone 4/6-c   Lines/Hardware:  Interval Events: started having generalized bullous lesions/swelling starting at 1: 37 am today involving upper and lower extremities, chest and abdomen, trunk.  T max 103.9, all antibiotics have been stopped for concerns of SJS. Transferred to step down. Started on steroids, benadryl. S/p biopsy by surgery and plan for transfer to burn center. 6/10 blood cx 2/2 sets NGTD, RVP including influenza A and B, SARScov2 and RSV negative, UA with mild hb, chest xray unremarkable    Assessment 67 year old male with PMH as below including CKD, CHF ( EF 20-25%)/nonischemic dilated cardiomyopathy,CAD/NSTEMI, polysubstance abuse, anxiety and depression, enterobacter cloacae bacteremia in 2016 admitted with    # Left hand/wrist cellulitis  - s/p 5 days and IV ceftriaxone and was on cefuroxime prior to that, improving   # Fevers/Generalized bullous lesions/Concerns for TENS/SJS - He had started blistering in his mouth on exam yesterday afternoon even before linezolid/Valcyclovir  was added which flared up overnight. Doubt linezolid/valacyclovir to be the culprit but agree with holding currently  - He was on ceftriaxone for last few days but did receive ceftriaxone back in 2016 - ? Possibly related to cefuroxime   Recommendations Fu skin biopsy results   No need for further abtx for left hand/wrist cellulitis currently since completed short course of abtx and  clinically improving Continue supportive management for possible SJS per primary and PCCM, plan to transfer to burn center ID available as needed, please recall if needed.   Rest of the management as per the primary team. Thank you for the consult. Please page with pertinent questions or concerns.  ______________________________________________________________________ Subjective patient seen and examined at the bedside. Generalized body pain   Vitals BP 99/66   Pulse 93   Temp (!) 97.5 F (36.4 C) (Oral)   Resp 15   Ht 5\' 8"  (1.727 m)   Wt 86.3 kg   SpO2 97%   BMI 28.93 kg/m     Physical Exam Constitutional:  elderly male sitting in the bed with soup in front of the table     Comments: no erythema in eye, mucosal defect  Cardiovascular:     Rate and Rhythm: Normal rate and regular rhythm.     Heart sounds: s1s2  Pulmonary:     Effort: Pulmonary effort is normal.     Comments: Normal lung sounds   Abdominal:     Palpations: Abdomen is soft.     Tenderness: non distended and non tender   Musculoskeletal:        General: bilateral leg swelling  Skin:    Comments: generalized bullous rashes in the upper and lower extremities, chest, abdomen, back, inner thighs, scrotum and head of penis ( GU exam findings reported by RN)  Neurological:     General: awake, alert and oriented, following commands  Psychiatric:        Mood and Affect: Mood normal.   Pertinent Microbiology Results for orders placed or performed during the hospital encounter of 12/22/22  Culture, blood (routine x 2)     Status: None  Collection Time: 12/22/22  7:59 PM   Specimen: BLOOD  Result Value Ref Range Status   Specimen Description   Final    BLOOD RIGHT ANTECUBITAL Performed at [facility], [address] [place], [place] [address]    Special Requests   Final    BOTTLES DRAWN AEROBIC AND ANAEROBIC Blood Culture adequate volume Performed at [facility], [address], [place], [place] [address]    Culture   Final    NO GROWTH 5 DAYS Performed at [facility], [address] [place], [place] [address]    Report Status 12/27/2022 FINAL  Final  Culture, blood (routine x 2)     Status: None   Collection Time: 12/22/22  8:22 PM   Specimen: BLOOD  Result Value Ref Range Status   Specimen Description   Final    BLOOD RIGHT ANTECUBITAL Performed at [facility], [address] [place], [place] [address]    Special Requests   Final    BOTTLES DRAWN AEROBIC AND ANAEROBIC Blood Culture adequate volume Performed at [facility], [address] [place], [place] [address]    Culture   Final    NO GROWTH 5 DAYS Performed at [facility], [address], [place], [place] [address]    Report Status 12/27/2022 FINAL  Final  Culture, blood (Routine X 2) w Reflex to ID Panel     Status: None (Preliminary result)   Collection Time: 12/26/22 10:46 AM   Specimen: BLOOD  Result Value Ref Range Status   Specimen Description   Final    BLOOD BLOOD LEFT ARM AEROBIC BOTTLE ONLY ANAEROBIC BOTTLE ONLY Performed at [facility], [address], [place], [place] [address]    Special Requests   Final    BOTTLES DRAWN AEROBIC AND ANAEROBIC Blood Culture adequate volume Performed at [facility], [address], [place], [place] [address]    Culture   Final    NO GROWTH < 24 HOURS Performed at [facility], [address], [place], [place] [address]    Report Status PENDING  Incomplete  Culture, blood (Routine X 2) w Reflex to ID Panel     Status: None (Preliminary result)   Collection Time: 12/26/22 10:46 AM   Specimen: BLOOD  Result Value Ref Range Status   Specimen Description   Final    BLOOD BLOOD LEFT HAND AEROBIC BOTTLE ONLY ANAEROBIC BOTTLE ONLY Performed at [facility], [address], [place], [place] [address]    Special Requests    Final    BOTTLES DRAWN AEROBIC AND ANAEROBIC Blood Culture results may not be optimal due to an inadequate volume of blood received in culture bottles Performed at [facility], [address], [place], [place] [address]    Culture   Final    NO GROWTH < 24 HOURS Performed at [facility], [address], [place], [place] [address]    Report Status PENDING  Incomplete  Resp panel by RT-PCR (RSV, Flu A&B, Covid) Anterior Nasal Swab     Status: None   Collection Time: 12/26/22  1:38 PM   Specimen: Anterior Nasal Swab  Result Value Ref Range Status   SARS Coronavirus 2 by RT PCR NEGATIVE NEGATIVE Final    Comment: (NOTE) SARS-CoV-2 target nucleic acids are NOT DETECTED.  The SARS-CoV-2 RNA is generally detectable in upper respiratory specimens  during the acute phase of infection. The lowest concentration of SARS-CoV-2 viral copies this assay can detect is 138 copies/mL. A negative result does not preclude SARS-Cov-2 infection and should not be used as the sole basis for treatment or other patient management decisions. A negative result may occur with  improper specimen collection/handling, submission of specimen other than nasopharyngeal swab, presence of viral mutation(s) within the areas targeted by this assay, and inadequate number of viral copies(<138 copies/mL). A negative result must be combined with clinical observations, patient history, and epidemiological information. The expected result is Negative.  Fact Sheet for Patients:  BloggerCourse.com  Fact Sheet for Healthcare Providers:  SeriousBroker.it  This test is no t yet approved or cleared by the [place] FDA and  has been authorized for detection and/or diagnosis of SARS-CoV-2 by FDA under an Emergency Use Authorization (EUA). This EUA will remain  in effect (meaning this test can be used) for the duration of the COVID-19 declaration under  Section 564(b)(1) of the Act, 21 U.S.C.section 360bbb-3(b)(1), unless the authorization is terminated  or revoked sooner.       Influenza A by PCR NEGATIVE NEGATIVE Final   Influenza B by PCR NEGATIVE NEGATIVE Final    Comment: (NOTE) The Xpert Xpress SARS-CoV-2/FLU/RSV plus assay is intended as an aid in the diagnosis of influenza from Nasopharyngeal swab specimens and should not be used as a sole basis for treatment. Nasal washings and aspirates are unacceptable for Xpert Xpress SARS-CoV-2/FLU/RSV testing.  Fact Sheet for Patients: BloggerCourse.com  Fact Sheet for Healthcare Providers: SeriousBroker.it  This test is not yet approved or cleared by the [place] FDA and has been authorized for detection and/or diagnosis of SARS-CoV-2 by FDA under an Emergency Use Authorization (EUA). This EUA will remain in effect (meaning this test can be used) for the duration of the COVID-19 declaration under Section 564(b)(1) of the Act, 21 U.S.C. section 360bbb-3(b)(1), unless the authorization is terminated or revoked.     Resp Syncytial Virus by PCR NEGATIVE NEGATIVE Final    Comment: (NOTE) Fact Sheet for Patients: BloggerCourse.com  Fact Sheet for Healthcare Providers: SeriousBroker.it  This test is not yet approved or cleared by the [place] FDA and has been authorized for detection and/or diagnosis of SARS-CoV-2 by FDA under an Emergency Use Authorization (EUA). This EUA will remain in effect (meaning this test can be used) for the duration of the COVID-19 declaration under Section 564(b)(1) of the Act, 21 U.S.C. section 360bbb-3(b)(1), unless the authorization is terminated or revoked.  Performed at [facility], [address], [place], [place] [address]   Respiratory (~20 pathogens) panel by PCR     Status: None   Collection Time: 12/26/22  1:38  PM   Specimen: Nasopharyngeal Swab; Respiratory  Result Value Ref Range Status   Adenovirus NOT DETECTED NOT DETECTED Final   Coronavirus 229E NOT DETECTED NOT DETECTED Final    Comment: (NOTE) The Coronavirus on the Respiratory Panel, DOES NOT test for the novel  Coronavirus (2019 nCoV)    Coronavirus HKU1 NOT DETECTED NOT DETECTED Final   Coronavirus NL63 NOT DETECTED NOT DETECTED Final   Coronavirus OC43 NOT DETECTED NOT DETECTED Final   Metapneumovirus NOT DETECTED NOT DETECTED Final   Rhinovirus / Enterovirus NOT DETECTED NOT DETECTED Final   Influenza A NOT DETECTED NOT DETECTED Final   Influenza B NOT DETECTED NOT DETECTED Final   Parainfluenza Virus 1 NOT DETECTED NOT DETECTED Final   Parainfluenza Virus 2 NOT  DETECTED NOT DETECTED Final   Parainfluenza Virus 3 NOT DETECTED NOT DETECTED Final   Parainfluenza Virus 4 NOT DETECTED NOT DETECTED Final   Respiratory Syncytial Virus NOT DETECTED NOT DETECTED Final   Bordetella pertussis NOT DETECTED NOT DETECTED Final   Bordetella Parapertussis NOT DETECTED NOT DETECTED Final   Chlamydophila pneumoniae NOT DETECTED NOT DETECTED Final   Mycoplasma pneumoniae NOT DETECTED NOT DETECTED Final    Comment: Performed at [facility], [address], [place], [place] [address]  MRSA Next Gen by PCR, Nasal     Status: None   Collection Time: 12/27/22  4:58 AM   Specimen: Nasal Mucosa; Nasal Swab  Result Value Ref Range Status   MRSA by PCR Next Gen NOT DETECTED NOT DETECTED Final    Comment: (NOTE) The GeneXpert MRSA Assay (FDA approved for NASAL specimens only), is one component of a comprehensive MRSA colonization surveillance program. It is not intended to diagnose MRSA infection nor to guide or monitor treatment for MRSA infections. Test performance is not FDA approved in patients less than 35 years old. Performed at [facility], [address], [place], [place] [address]    Pertinent Lab.    Latest  Ref Rng & Units 12/27/2022    6:06 AM 12/26/2022   10:46 AM 12/25/2022    4:45 AM  CBC  WBC 4.0 - 10.5 K/uL 8.1  10.7  7.2   Hemoglobin 13.0 - 17.0 g/dL 56.2  13.0  86.5   Hematocrit 39.0 - 52.0 % 47.9  48.1  39.7   Platelets 150 - 400 K/uL 192  255  213       Latest Ref Rng & Units 12/27/2022    6:06 AM 12/26/2022    4:07 PM 12/26/2022    4:25 AM  CMP  Glucose 70 - 99 mg/dL 784   76   BUN 8 - 23 mg/dL 50   40   Creatinine 6.96 - 1.24 mg/dL 2.95   2.84   Sodium 132 - 145 mmol/L 135   138   Potassium 3.5 - 5.1 mmol/L 4.2   4.4   Chloride 98 - 111 mmol/L 102   105   CO2 22 - 32 mmol/L 20   20   Calcium 8.9 - 10.3 mg/dL 8.2   8.9   Total Protein 6.5 - 8.1 g/dL  6.4    Total Bilirubin 0.3 - 1.2 mg/dL  1.4    Alkaline Phos 38 - 126 U/L  58    AST 15 - 41 U/L  42    ALT 0 - 44 U/L  20       Pertinent Imaging today Plain films and CT images have been personally visualized and interpreted; radiology reports have been reviewed. Decision making incorporated into the Impression /   DG CHEST PORT 1 VIEW  Result Date: 12/26/2022 CLINICAL DATA:  Fevers EXAM: PORTABLE CHEST 1 VIEW COMPARISON:  12/17/2022 FINDINGS: Cardiac shadow is enlarged but accentuated by the portable technique. Lungs are clear bilaterally. No bony abnormality is noted. IMPRESSION: No acute abnormality noted. Electronically Signed   By: Alcide Clever M.D.   On: 12/26/2022 11:48    I have personally spent 52  minutes involved in face-to-face and non-face-to-face activities for this patient on the day of the visit. Professional time spent includes the following activities: Preparing to see the patient (review of tests), Obtaining and/or reviewing separately obtained history (admission/discharge record), Performing a medically appropriate examination and/or evaluation ,  Ordering medications/tests/procedures, referring and communicating with other health care professionals, Documenting clinical information in the EMR, Independently  interpreting results (not separately reported), Communicating results to the patient/family/caregiver, Counseling and educating the patient/family/caregiver and Care coordination (not separately reported).   Plan d/w requesting provider as well as ID pharm D  Note: This document was prepared using dragon voice recognition software and may include unintentional dictation errors.   Electronically signed by:   Odette Fraction, MD Infectious Disease Physician [facility] Pager: [phone]

## 2022-12-27 NOTE — Progress Notes (Signed)
Patient placed on monitors, large amounts of epidermal sloughing to chest area/ neck and upper arms noted. Dressed with xeroform and covered with ABD. Patient noted to have mulitple blisters to bilateral feet, quite large in size as well as buttocks and back. Scattered abdominal blistering noted as well.

## 2022-12-27 NOTE — Consult Note (Addendum)
  Ophthalmology Consult  This is a 67 yo gentleman who was being treated for hand cellulitis with linezolid who developed presumed SJS.  He has full body involvement including eyelids and ophthalmology was consulted to look for ocular involvement. Pt has no visual complaints except for occasional blurriness and tearing secondary to eyelid swelling in both upper and lower eyelids. Pt has no past ocular history.    On exam pt was found to be 20/30 in both eyes with a near card.  IOP was 14 OD and 15 OS.  PERRL.  Extraocular motility intact.  Full ductions and versions.    L/L/L:  Mild swelling of upper and lower eyelids and sloughing of skin of Right upper eyelid C/S: Melanosis but no injection. No symblephoron and no pseudomembranes K: Clear OU Arcus OU A/C: Deep/Quiet OU I: R/R OU L: 2+NS OU  DFE Vitreous clear with no cells seen.  Retina flat and intact with no retinal breaks or tears  A/P 1.Presumed SJS:   No conjunctival involvement or iritis at this time.  Dry eye symptoms secondary to eyelid swelling.  Pt to use AT'S qid and Lacrilube at night.  Will check back tomorrow if pt is not transferred.   2. Cataracts OU: not visually significant  Thank you for allowing me to participate in the care of this patient.  Please feel free to contact me if you have any concerns.  Mia Creek, M.D,. Cell [phone] Office [phone]

## 2022-12-27 NOTE — Progress Notes (Addendum)
       Overnight   NAME: [name] MRN: [id number] DOB : 27-Oct-1955    Date of Service   12/27/2022   HPI/Events of Note    Notified by RN that bed is available at [facility], transport has been called([facility]). Pending transfer, awaiting arrival of transport  Notified that accepting physician Dr. Ermalinda Memos, Hospitalist    Interventions/ Plan   Bed secured at [facility], transfer as previously ordered. Continue previous orders.      Chinita Greenland BSN MSNA MSN ACNPC-AG Acute Care Nurse Practitioner [facility]

## 2022-12-27 NOTE — Progress Notes (Addendum)
Patient was seen and examined at his bedside.  He is alert and responding to questions appropriately.  Plan is to transfer to [facility] for dermatology consultation due to concern for possible Stevens-Gobin syndrome.  Discussed the case with hospitalist at [facility] Dr. Lorin Picket Amrhern who has accepted the patient.    Highly appreciate [facility] hospitalist service and dermatology service assistance.   Time: 15 minutes.

## 2022-12-27 NOTE — Progress Notes (Signed)
NAME:  [name], MRN:  [id number], DOB:  [birth year], LOS: 4 ADMISSION DATE:  12/22/2022, CONSULTATION DATE:  6/11 REFERRING MD:  Dr. Isidoro Donning, CHIEF COMPLAINT:  Rash, facial swelling  History of Present Illness:  67 year old male with complex PMH as below, which is significant for Stage IV CKD, dilated cardiomyopathy with EF 20-25%, severe MR, and right apical thrombus on AC, and substance abuse history. He was initially seen in the [facility] ED 6/1 with complaints of left forearm pain and redness. He was treated for cellulitis and discharged to home with oral cefuroxime.  Then 6/6 he presented to [facility] ED with complaints of worsening pain and swelling of the left hand and forearm. Imaging of the area was unremarkable. He was admitted for treatment of cellulitis with IV antibiotics. The concerning area had been improving, but 6/10 he developed fevers. Origin was felt to be unclear. ID was consulted and the patient was ultimately started on linezolid. Later that night in the early hours of 6/11 he was transferred to the ICU for bullous rash with edema originating from that left arm, but now spread to the trunk and neck. The patient was started on steroids, which did improve the edema, but the bullous rash persisted. 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 [facility] transfer center was contacted and requested biopsy. PCCM consulted for further recommendations.   Pertinent  Medical History   has a past medical history of Anxiety, Arthritis, CKD (chronic kidney disease), stage IV (HCC), Depression, Headache, History of echocardiogram (12/2014), Homelessness, Hypercholesterolemia, Hypertension, Ischemic dilated cardiomyopathy (HCC), Noncompliance, Nonischemic dilated cardiomyopathy (HCC) (07/2014), NSTEMI (non-ST elevated myocardial infarction) (HCC) (2016), Polysubstance abuse (HCC), Urinary hesitancy, and Walking pneumonia (07/2014).   Significant Hospital Events: Including procedures, antibiotic start and stop dates in addition to  other pertinent events   6/2 presented for left arm cellutlitis. D/c on cefuroxime 6/6 admit for worsening cellulitis  6/10 fevers despite improving cellulitis. ID consult. Linezolid added 6/11 developed bullous rash. Tx to ICU. Surgery consulted for biopsy  Interim History / Subjective:    Objective   Blood pressure 112/72, pulse 77, temperature 97.8 F (36.6 C), temperature source Oral, resp. rate (!) 0, height 5\' 8"  (1.727 m), weight 86.3 kg, SpO2 98 %.        Intake/Output Summary (Last 24 hours) at 12/27/2022 0733 Last data filed at 12/27/2022 0131 Gross per 24 hour  Intake 240 ml  Output 1050 ml  Net -810 ml   Filed Weights   12/25/22 0500 12/26/22 0500 12/27/22 0424  Weight: 91 kg 87.5 kg 86.3 kg    Examination: General: Overweight elderly appearing male in NAD HENT: [place]/AT, no JVD Lungs: Clear bilateral breath sounds Cardiovascular: RRR Abdomen: Soft, NT, ND Extremities: No acute deformity or ROM limitation Neuro: Alert, oriented, non-focal Skin: Bullous rash primarily affecting the trunk, but with extension to both upper an lower extremities.     Resolved Hospital Problem list     Assessment & Plan:   Bullous rash: timeline would suggest this is a reaction to linezolid infusion. Concern for TEN/SJS. Neck swelling: soft tissue. Improves with steroids - Appreciate WOC RN recommendations - [facility] has been consulted for biopsy - [facility] is reportedly aware of the patient, but will nee biopsy prior to consideration of transfer. - Zyvox discontinued - Continue solumedrol  Cellulitis of the left upper extremity:  Fever: Started 6/10 in the setting of improving cellulitis. CXR and UA from 6/10 unremarkable.  Oral blisters - Continue ceftriaxone - Appreciate ID  recs - Magic mouthwash  HFrEF Severe MR Right atrial thrombus - Dr. Sharyn Lull following - Responded well to diuresis - Known to heart failure clinic - Management per primary/cardiology - GDMT with  entresto, metoprolol, lasix - Eliquis  CKD - per primary  Substance abuse: cocaine and alcohol. Both used relatively recently - cessation counseling when appropriate    Best Practice (right click and "Reselect all SmartList Selections" daily)   Diet/type: Regular consistency (see orders) DVT prophylaxis: DOAC GI prophylaxis: H2B Lines: N/A Foley:  N/A Code Status:  full code Last date of multidisciplinary goals of care discussion [ patient updated bedside 6/11]  Labs   CBC: Recent Labs  Lab 12/22/22 2027 12/23/22 0143 12/24/22 0453 12/25/22 0445 12/26/22 1046 12/27/22 0606  WBC 5.0 6.7 10.2 7.2 10.7* 8.1  NEUTROABS 2.7  --   --   --  8.0*  --   HGB 13.3 13.3 11.9* 12.7* 15.6 15.8  HCT 42.1 42.6 36.9* 39.7 48.1 47.9  MCV 106.9* 107.3* 104.2* 105.6* 103.9* 102.6*  PLT 211 211 198 213 255 192    Basic Metabolic Panel: Recent Labs  Lab 12/23/22 0143 12/24/22 0453 12/25/22 0445 12/26/22 0425 12/27/22 0606  NA 138 136 137 138 135  K 4.5 4.4 4.1 4.4 4.2  CL 107 105 106 105 102  CO2 20* 20* 20* 20* 20*  GLUCOSE 88 133* 92 76 112*  BUN 22 33* 38* 40* 50*  CREATININE 1.78* 2.03* 1.90* 2.19* 2.41*  CALCIUM 9.0 8.9 9.0 8.9 8.2*  MG 2.1  --   --   --   --    GFR: Estimated Creatinine Clearance: 31.4 mL/min (A) (by C-G formula based on SCr of 2.41 mg/dL (H)). Recent Labs  Lab 12/22/22 2028 12/23/22 0142 12/23/22 0143 12/24/22 0453 12/25/22 0445 12/26/22 0425 12/26/22 1046 12/27/22 0606  PROCALCITON  --  0.13  --   --   --  0.45  --   --   WBC  --   --    < > 10.2 7.2  --  10.7* 8.1  LATICACIDVEN 1.4  --   --   --   --   --   --   --    < > = values in this interval not displayed.    Liver Function Tests: Recent Labs  Lab 12/22/22 2027 12/26/22 1607  AST 21 42*  ALT 19 20  ALKPHOS 86 58  BILITOT 0.8 1.4*  PROT 6.9 6.4*  ALBUMIN 3.8 3.4*   No results for input(s): "LIPASE", "AMYLASE" in the last 168 hours. No results for input(s): "AMMONIA" in  the last 168 hours.  ABG    Component Value Date/Time   PHART 7.427 05/16/2015 1821   PCO2ART 34.5 (L) 05/16/2015 1821   PO2ART 122 (H) 05/16/2015 1821   HCO3 22.7 05/16/2015 1821   TCO2 25 06/14/2016 1706   ACIDBASEDEF 1.1 05/16/2015 1821   O2SAT 98.7 05/16/2015 1821     Coagulation Profile: No results for input(s): "INR", "PROTIME" in the last 168 hours.  Cardiac Enzymes: No results for input(s): "CKTOTAL", "CKMB", "CKMBINDEX", "TROPONINI" in the last 168 hours.  HbA1C: Hgb A1c MFr Bld  Date/Time Value Ref Range Status  08/14/2014 06:56 PM 5.4 4.8 - 5.6 % Final    Comment:    (NOTE)         Pre-diabetes: 5.7 - 6.4         Diabetes: >6.4         Glycemic control  for adults with diabetes: <7.0   08/13/2014 06:43 PM 5.4 4.8 - 5.6 % Final    Comment:    (NOTE)         Pre-diabetes: 5.7 - 6.4         Diabetes: >6.4         Glycemic control for adults with diabetes: <7.0     CBG: No results for input(s): "GLUCAP" in the last 168 hours.  Review of Systems:   Bolds are positive  Constitutional: weight loss, gain, night sweats, Fevers, chills, fatigue .  HEENT: headaches, Sore throat, sneezing, nasal congestion, post nasal drip, Difficulty swallowing, Tooth/dental problems, visual complaints visual changes, ear ache CV:  chest pain, radiates:,Orthopnea, PND, swelling in lower extremities, dizziness, palpitations, syncope.  GI  heartburn, indigestion, abdominal pain, nausea, vomiting, diarrhea, change in bowel habits, loss of appetite, bloody stools.  Resp: cough, productive: , hemoptysis, dyspnea, chest pain, pleuritic.  Skin: rash or itching or icterus GU: dysuria, change in color of urine, urgency or frequency. flank pain, hematuria  MS: joint pain or swelling. decreased range of motion  Psych: change in mood or affect. depression or anxiety.  Neuro: difficulty with speech, weakness, numbness, ataxia    Past Medical History:  He,  has a past medical history of  Anxiety, Arthritis, CKD (chronic kidney disease), stage IV (HCC), Depression, Headache, History of echocardiogram (12/2014), Homelessness, Hypercholesterolemia, Hypertension, Ischemic dilated cardiomyopathy (HCC), Noncompliance, Nonischemic dilated cardiomyopathy (HCC) (07/2014), NSTEMI (non-ST elevated myocardial infarction) (HCC) (2016), Polysubstance abuse (HCC), Urinary hesitancy, and Walking pneumonia (07/2014).   Surgical History:   Past Surgical History:  Procedure Laterality Date   LEFT HEART CATHETERIZATION WITH CORONARY ANGIOGRAM N/A 08/15/2014   Procedure: LEFT HEART CATHETERIZATION WITH CORONARY ANGIOGRAM;  Surgeon: Robynn Pane, MD;  Location: [facility] CATH LAB;  Service: Cardiovascular;; normal coronary arteries.  Severely elevated LVEDP of 35 mmHg.   TRANSTHORACIC ECHOCARDIOGRAM  08/2018   EF 35-40% (up from 20-25% in 2017).  No MR WMA.  Global HK.  GRII DD.  Normal valves.     Social History:   reports that he quit smoking about 18 years ago. His smoking use included cigarettes. He has a 45.00 pack-year smoking history. He has quit using smokeless tobacco. He reports current alcohol use of about 2.0 - 4.0 standard drinks of alcohol per week. He reports current drug use. Drug: "Crack" cocaine.   Family History:  His family history is not on file.   Allergies Allergies  Allergen Reactions   Vancomycin Hives and Cough    And restlessness, agitation   Tape Rash    Please use paper tape     Home Medications  Prior to Admission medications   Medication Sig Start Date End Date Taking? Authorizing Provider  acetaminophen (TYLENOL) 325 MG tablet Take 2 tablets (650 mg total) by mouth every 6 (six) hours as needed. Patient taking differently: Take 650 mg by mouth every 6 (six) hours as needed for moderate pain. 12/17/22  Yes Tanda Rockers A, DO  albuterol (VENTOLIN HFA) 108 (90 Base) MCG/ACT inhaler Inhale 2 puffs into the lungs every 4 (four) hours as needed for wheezing or shortness  of breath. 08/31/22  Yes Horton, Mayer Masker, MD  apixaban (ELIQUIS) 5 MG TABS tablet Take 1 tablet (5 mg total) by mouth 2 (two) times daily. 12/07/22  Yes Andrey Farmer, PA-C  atorvastatin (LIPITOR) 40 MG tablet Take 40 mg by mouth at bedtime. 08/25/22  Yes [provider]  carvedilol (  COREG) 6.25 MG tablet Take 1 tablet (6.25 mg total) by mouth 2 (two) times daily. 12/07/22  Yes Andrey Farmer, PA-C  clotrimazole-betamethasone (LOTRISONE) cream Apply 1 application topically 2 (two) times daily. 02/04/19  Yes Hoy Register, MD  dapagliflozin propanediol (FARXIGA) 10 MG TABS tablet Take 1 tablet (10 mg total) by mouth daily before breakfast. 12/07/22  Yes Andrey Farmer, PA-C  famotidine (PEPCID) 20 MG tablet Take 20 mg by mouth 2 (two) times daily. 11/08/22  Yes [provider]  furosemide (LASIX) 80 MG tablet Take 0.5 tablets (40 mg total) by mouth 2 (two) times daily. 12/07/22  Yes Andrey Farmer, PA-C  gabapentin (NEURONTIN) 600 MG tablet Take 600 mg by mouth 2 (two) times daily. 11/07/22  Yes [provider]  montelukast (SINGULAIR) 10 MG tablet Take 10 mg by mouth at bedtime. 08/27/22  Yes [provider]  nitroGLYCERIN (NITROSTAT) 0.4 MG SL tablet Place 1 tablet (0.4 mg total) under the tongue every 5 (five) minutes x 3 doses as needed for chest pain. 01/06/17  Yes Hoy Register, MD  oxyCODONE (ROXICODONE) 5 MG immediate release tablet Take 1 tablet (5 mg total) by mouth every 6 (six) hours as needed for severe pain. 12/17/22  Yes Tanda Rockers A, DO  potassium chloride SA (K-DUR,KLOR-CON) 20 MEQ tablet Take 20 mEq by mouth daily. 06/04/16  Yes [provider]  sacubitril-valsartan (ENTRESTO) 49-51 MG Take 1 tablet by mouth 2 (two) times daily.   Yes [provider]  tamsulosin (FLOMAX) 0.4 MG CAPS capsule Take 0.4 mg by mouth daily. 11/07/22  Yes [provider]  tiZANidine (ZANAFLEX) 4 MG tablet Take 1 tablet (4  mg total) by mouth every 6 (six) hours as needed for muscle spasms. MUST MAKE APPT FOR FURTHER REFILLS 02/22/19  Yes Hoy Register, MD  Misc. Devices MISC Blood pressure monitor Dx: Hypertension, CHF 11/01/18   Hoy Register, MD     Critical care time:      Joneen Roach, AGACNP-BC [facility]  See Amion for personal pager PCCM on call pager [phone] until 7pm. Please call Elink 7p-7a. [phone]  12/27/2022 8:35 AM

## 2022-12-27 NOTE — Consult Note (Addendum)
WOC Nurse Consult Note: Reason for Consult: Consult requested for rash and blisters related to possible drug reaction.  Surgical team has been consulted for punch biopsy and Critical care team has requested possible transfer to a burn center.  Pt has multiple clear fluid filled bulla to neck, bilat shoulders, anterior trunk.  Few scattered areas to buttocks; arms and legs with skin mostly intact.  Multiple areas have ruptured and evolved into full thickness skin loss; red and moist and painful to touch.  Discussed plan of care with Critical care team. Currently, the severity of skin loss is not as significant as occurs in some cases, but could certainly evolve in severity.  Dressing procedure/placement/frequency: Pt is on a low air loss mattress to decrease pressure and increase airflow.  Topical treatment orders provided for bedside nurses to perform to protect and promote healing: Apply Xeroform gauze to open blistered areas Q day and PRN. Please re-consult if further assistance is needed.  Thank-you,  Cammie Mcgee MSN, RN, CWOCN, [place], CNS [phone]

## 2022-12-27 NOTE — Progress Notes (Addendum)
Pt called d/t to complaints of burning on his chest. Upon assessment this RN found a big skin tear measuring 11cmX 7cm on pt right upper chest. RN asked pt what happened and he stated " I took the sticker off" that's when this RN came to noticed blisters all around pt body. Notified NP Reuel Boom and CN Pam. Applied nonadherent  gauze on skin tear and remove all pt tele stickers. Pt in no acute distress. Will continue to monitor pt.

## 2022-12-27 NOTE — Progress Notes (Addendum)
Informed by on call provider that this person needed to transfer to SD level of care. Assisted in transfer. No CHG baths ordered due to skin blistering.

## 2022-12-27 NOTE — Procedures (Signed)
Procedures ADDENDUM: Request by pathology for a second specimen.  The same site was once again used after re-verifying consent.  The site was still anesthetized and a new 4mm punch biopsy was used for a second sample.  This was placed in saline and taken personally to pathology.  Hemostasis was achieved and site dressed.  Letha Cape 11:02 AM 12/27/2022

## 2022-12-27 NOTE — Consult Note (Signed)
Consult Note  [name] [birth year]  [id number].    Requesting MD: Dr. Isidoro Donning Chief Complaint/Reason for Consult: rash - punch biopsy  HPI:  67 y.o. male with medical history significant for HTN, HLD, CAD, NSTEMI, nonischemic dilated cardiomyopathy, chronic systolic CHF (EF 20 to 25%), CKD 4, polysubstance abuse (cocaine, alcohol), anxiety, depression who presented to [facility] ED on 6/6 with worsening redness and swelling of left hand despite compliance with antibiotics (was seen on 6/1 for same complaint). He was admitted to the hospitalist service for further evaluation and treatment.  Since admission he has been on IV antibiotics - rocephin. He as been followed by cardiology for cardiomyopathy/CHF and apical thrombus and is on eliquis. Antibiotics changed to linezolid and overnight 6/10-6/11 he had worsening rash/swelling with bullae. IV steroids given and general surgery asked to see for punch biopsy due to concern for SJS-type rash.   ROS: Reviewed and as above  History reviewed. No pertinent family history.  Past Medical History:  Diagnosis Date   Anxiety    Arthritis    "all over" (01/07/2015)   CKD (chronic kidney disease), stage IV (HCC)    Depression    Headache    "probably weekly" (01/07/2015)   History of echocardiogram 12/2014   EF 20-25% -> improved up to EF 35 and 40% February 2020:   Homelessness    Hypercholesterolemia    Hypertension    Ischemic dilated cardiomyopathy (HCC)    Noncompliance    Nonischemic dilated cardiomyopathy (HCC) 07/2014   Initial EF was 2025% with global hypokinesis.  As of February 2020 EF up to 35-40%, GRII DD.  Global hypokinesis.  No WMA.  Relatively normal valves.   NSTEMI (non-ST elevated myocardial infarction) [facility]) 2016   2 separate episodes in January and June.  Normal coronary arteries by cardiac catheterization.  Thought to be related to.   Polysubstance abuse (HCC)    etoh, cocaine   Urinary hesitancy    Walking  pneumonia 07/2014    Past Surgical History:  Procedure Laterality Date   LEFT HEART CATHETERIZATION WITH CORONARY ANGIOGRAM N/A 08/15/2014   Procedure: LEFT HEART CATHETERIZATION WITH CORONARY ANGIOGRAM;  Surgeon: Robynn Pane, MD;  Location: [facility] CATH LAB;  Service: Cardiovascular;; normal coronary arteries.  Severely elevated LVEDP of 35 mmHg.   TRANSTHORACIC ECHOCARDIOGRAM  08/2018   EF 35-40% (up from 20-25% in 2017).  No MR WMA.  Global HK.  GRII DD.  Normal valves.    Social History:  reports that he quit smoking about 18 years ago. His smoking use included cigarettes. He has a 45.00 pack-year smoking history. He has quit using smokeless tobacco. He reports current alcohol use of about 2.0 - 4.0 standard drinks of alcohol per week. He reports current drug use. Drug: "Crack" cocaine.  Allergies:  Allergies  Allergen Reactions   Vancomycin Hives and Cough    And restlessness, agitation   Tape Rash    Please use paper tape    Medications Prior to Admission  Medication Sig Dispense Refill   acetaminophen (TYLENOL) 325 MG tablet Take 2 tablets (650 mg total) by mouth every 6 (six) hours as needed. (Patient taking differently: Take 650 mg by mouth every 6 (six) hours as needed for moderate pain.) 36 tablet 0   albuterol (VENTOLIN HFA) 108 (90 Base) MCG/ACT inhaler Inhale 2 puffs into the lungs every 4 (four) hours as needed for wheezing or shortness of breath. 1 each 0   apixaban (ELIQUIS)  5 MG TABS tablet Take 1 tablet (5 mg total) by mouth 2 (two) times daily. 60 tablet 5   atorvastatin (LIPITOR) 40 MG tablet Take 40 mg by mouth at bedtime.     carvedilol (COREG) 6.25 MG tablet Take 1 tablet (6.25 mg total) by mouth 2 (two) times daily. 60 tablet 2   [EXPIRED] cefUROXime (CEFTIN) 500 MG tablet Take 1 tablet (500 mg total) by mouth 2 (two) times daily with a meal for 7 days. 14 tablet 0   clotrimazole-betamethasone (LOTRISONE) cream Apply 1 application topically 2 (two) times daily. 45  g 0   dapagliflozin propanediol (FARXIGA) 10 MG TABS tablet Take 1 tablet (10 mg total) by mouth daily before breakfast. 30 tablet 11   famotidine (PEPCID) 20 MG tablet Take 20 mg by mouth 2 (two) times daily.     furosemide (LASIX) 80 MG tablet Take 0.5 tablets (40 mg total) by mouth 2 (two) times daily. 15 tablet 2   gabapentin (NEURONTIN) 600 MG tablet Take 600 mg by mouth 2 (two) times daily.     montelukast (SINGULAIR) 10 MG tablet Take 10 mg by mouth at bedtime.     nitroGLYCERIN (NITROSTAT) 0.4 MG SL tablet Place 1 tablet (0.4 mg total) under the tongue every 5 (five) minutes x 3 doses as needed for chest pain. 30 tablet 1   oxyCODONE (ROXICODONE) 5 MG immediate release tablet Take 1 tablet (5 mg total) by mouth every 6 (six) hours as needed for severe pain. 10 tablet 0   potassium chloride SA (K-DUR,KLOR-CON) 20 MEQ tablet Take 20 mEq by mouth daily.  3   sacubitril-valsartan (ENTRESTO) 49-51 MG Take 1 tablet by mouth 2 (two) times daily.     tamsulosin (FLOMAX) 0.4 MG CAPS capsule Take 0.4 mg by mouth daily.     tiZANidine (ZANAFLEX) 4 MG tablet Take 1 tablet (4 mg total) by mouth every 6 (six) hours as needed for muscle spasms. MUST MAKE APPT FOR FURTHER REFILLS 30 tablet 0   Misc. Devices MISC Blood pressure monitor Dx: Hypertension, CHF 1 each 0    Blood pressure 112/72, pulse 77, temperature 97.8 F (36.6 C), temperature source Oral, resp. rate (!) 0, height 5\' 8"  (1.727 m), weight 86.3 kg, SpO2 98 %. Physical Exam: General: acutely ill appearing, WD, male who is laying in bed in NAD HEENT: some sloughing of the mucosa in his mouth Heart: regular, rate, and rhythm.   Lungs:  Respiratory effort nonlabored Abd: soft, ND, bullae present here as well.  See procedure note Skin: diffuse erythema and bullae from head to toe.   Psych: A&Ox3 with an appropriate affect.    Results for orders placed or performed during the hospital encounter of 12/22/22 (from the past 48 hour(s))   Basic metabolic panel     Status: Abnormal   Collection Time: 12/26/22  4:25 AM  Result Value Ref Range   Sodium 138 135 - 145 mmol/L   Potassium 4.4 3.5 - 5.1 mmol/L   Chloride 105 98 - 111 mmol/L   CO2 20 (L) 22 - 32 mmol/L   Glucose, Bld 76 70 - 99 mg/dL    Comment: Glucose reference range applies only to samples taken after fasting for at least 8 hours.   BUN 40 (H) 8 - 23 mg/dL   Creatinine, Ser 0.45 (H) 0.61 - 1.24 mg/dL   Calcium 8.9 8.9 - 40.9 mg/dL   GFR, Estimated 32 (L) >60 mL/min    Comment: (NOTE) Calculated  using the CKD-EPI Creatinine Equation (2021)    Anion gap 13 5 - 15    Comment: Performed at [facility], [address] [place], [place] [address]  Procalcitonin     Status: None   Collection Time: 12/26/22  4:25 AM  Result Value Ref Range   Procalcitonin 0.45 ng/mL    Comment:        Interpretation: PCT (Procalcitonin) <= 0.5 ng/mL: Systemic infection (sepsis) is not likely. Local bacterial infection is possible. (NOTE)       Sepsis PCT Algorithm           Lower Respiratory Tract                                      Infection PCT Algorithm    ----------------------------     ----------------------------         PCT < 0.25 ng/mL                PCT < 0.10 ng/mL          Strongly encourage             Strongly discourage   discontinuation of antibiotics    initiation of antibiotics    ----------------------------     -----------------------------       PCT 0.25 - 0.50 ng/mL            PCT 0.10 - 0.25 ng/mL               OR       >80% decrease in PCT            Discourage initiation of                                            antibiotics      Encourage discontinuation           of antibiotics    ----------------------------     -----------------------------         PCT >= 0.50 ng/mL              PCT 0.26 - 0.50 ng/mL               AND        <80% decrease in PCT             Encourage initiation of                                              antibiotics       Encourage continuation           of antibiotics    ----------------------------     -----------------------------        PCT >= 0.50 ng/mL                  PCT > 0.50 ng/mL               AND         increase in PCT                  Strongly encourage  initiation of antibiotics    Strongly encourage escalation           of antibiotics                                     -----------------------------                                           PCT <= 0.25 ng/mL                                                 OR                                        > 80% decrease in PCT                                      Discontinue / Do not initiate                                             antibiotics  Performed at [facility], [address] [place], [place] [address]   CBC with Differential/Platelet     Status: Abnormal   Collection Time: 12/26/22 10:46 AM  Result Value Ref Range   WBC 10.7 (H) 4.0 - 10.5 K/uL   RBC 4.63 4.22 - 5.81 MIL/uL   Hemoglobin 15.6 13.0 - 17.0 g/dL   HCT 21.3 08.6 - 57.8 %   MCV 103.9 (H) 80.0 - 100.0 fL   MCH 33.7 26.0 - 34.0 pg   MCHC 32.4 30.0 - 36.0 g/dL   RDW 46.9 62.9 - 52.8 %   Platelets 255 150 - 400 K/uL   nRBC 0.7 (H) 0.0 - 0.2 %   Neutrophils Relative % 74 %   Neutro Abs 8.0 (H) 1.7 - 7.7 K/uL   Lymphocytes Relative 13 %   Lymphs Abs 1.4 0.7 - 4.0 K/uL   Monocytes Relative 3 %   Monocytes Absolute 0.3 0.1 - 1.0 K/uL   Eosinophils Relative 8 %   Eosinophils Absolute 0.8 (H) 0.0 - 0.5 K/uL   Basophils Relative 1 %   Basophils Absolute 0.1 0.0 - 0.1 K/uL   Immature Granulocytes 1 %   Abs Immature Granulocytes 0.12 (H) 0.00 - 0.07 K/uL   Reactive, Benign Lymphocytes PRESENT     Comment: Performed at [facility], [address], [place], [place] [address]  Urinalysis, w/ Reflex to Culture (Infection Suspected) -Urine, Clean Catch     Status:  Abnormal   Collection Time: 12/26/22  1:16 PM  Result Value Ref Range   Specimen Source URINE, CLEAN CATCH    Color, Urine YELLOW YELLOW   APPearance CLEAR CLEAR   Specific Gravity, Urine 1.009 1.005 - 1.030   pH 5.0 5.0 - 8.0   Glucose, UA NEGATIVE NEGATIVE mg/dL   Hgb urine  dipstick SMALL (A) NEGATIVE   Bilirubin Urine NEGATIVE NEGATIVE   Ketones, ur NEGATIVE NEGATIVE mg/dL   Protein, ur NEGATIVE NEGATIVE mg/dL   Nitrite NEGATIVE NEGATIVE   Leukocytes,Ua NEGATIVE NEGATIVE   RBC / HPF 0-5 0 - 5 RBC/hpf   WBC, UA 0-5 0 - 5 WBC/hpf    Comment:        Reflex urine culture not performed if WBC <=10, OR if Squamous epithelial cells >5. If Squamous epithelial cells >5 suggest recollection.    Bacteria, UA RARE (A) NONE SEEN   Squamous Epithelial / HPF 0-5 0 - 5 /HPF   Mucus PRESENT    Hyaline Casts, UA PRESENT     Comment: Performed at [facility], [address] [place] [address], [place], [place] [address]  Resp panel by RT-PCR (RSV, Flu A&B, Covid) Anterior Nasal Swab     Status: None   Collection Time: 12/26/22  1:38 PM   Specimen: Anterior Nasal Swab  Result Value Ref Range   SARS Coronavirus 2 by RT PCR NEGATIVE NEGATIVE    Comment: (NOTE) SARS-CoV-2 target nucleic acids are NOT DETECTED.  The SARS-CoV-2 RNA is generally detectable in upper respiratory specimens during the acute phase of infection. The lowest concentration of SARS-CoV-2 viral copies this assay can detect is 138 copies/mL. A negative result does not preclude SARS-Cov-2 infection and should not be used as the sole basis for treatment or other patient management decisions. A negative result may occur with  improper specimen collection/handling, submission of specimen other than nasopharyngeal swab, presence of viral mutation(s) within the areas targeted by this assay, and inadequate number of viral copies(<138 copies/mL). A negative result must be combined with clinical observations, patient history, and  epidemiological information. The expected result is Negative.  Fact Sheet for Patients:  BloggerCourse.com  Fact Sheet for Healthcare Providers:  SeriousBroker.it  This test is no t yet approved or cleared by the [place] FDA and  has been authorized for detection and/or diagnosis of SARS-CoV-2 by FDA under an Emergency Use Authorization (EUA). This EUA will remain  in effect (meaning this test can be used) for the duration of the COVID-19 declaration under Section 564(b)(1) of the Act, 21 U.S.C.section 360bbb-3(b)(1), unless the authorization is terminated  or revoked sooner.       Influenza A by PCR NEGATIVE NEGATIVE   Influenza B by PCR NEGATIVE NEGATIVE    Comment: (NOTE) The Xpert Xpress SARS-CoV-2/FLU/RSV plus assay is intended as an aid in the diagnosis of influenza from Nasopharyngeal swab specimens and should not be used as a sole basis for treatment. Nasal washings and aspirates are unacceptable for Xpert Xpress SARS-CoV-2/FLU/RSV testing.  Fact Sheet for Patients: BloggerCourse.com  Fact Sheet for Healthcare Providers: SeriousBroker.it  This test is not yet approved or cleared by the [place] FDA and has been authorized for detection and/or diagnosis of SARS-CoV-2 by FDA under an Emergency Use Authorization (EUA). This EUA will remain in effect (meaning this test can be used) for the duration of the COVID-19 declaration under Section 564(b)(1) of the Act, 21 U.S.C. section 360bbb-3(b)(1), unless the authorization is terminated or revoked.     Resp Syncytial Virus by PCR NEGATIVE NEGATIVE    Comment: (NOTE) Fact Sheet for Patients: BloggerCourse.com  Fact Sheet for Healthcare Providers: SeriousBroker.it  This test is not yet approved or cleared by the [place] FDA and has been authorized for  detection and/or diagnosis of SARS-CoV-2 by FDA under an Emergency Use Authorization (EUA). This  EUA will remain in effect (meaning this test can be used) for the duration of the COVID-19 declaration under Section 564(b)(1) of the Act, 21 U.S.C. section 360bbb-3(b)(1), unless the authorization is terminated or revoked.  Performed at [facility], [address] [place], [place] [address]   Respiratory (~20 pathogens) panel by PCR     Status: None   Collection Time: 12/26/22  1:38 PM   Specimen: Nasopharyngeal Swab; Respiratory  Result Value Ref Range   Adenovirus NOT DETECTED NOT DETECTED   Coronavirus 229E NOT DETECTED NOT DETECTED    Comment: (NOTE) The Coronavirus on the Respiratory Panel, DOES NOT test for the novel  Coronavirus (2019 nCoV)    Coronavirus HKU1 NOT DETECTED NOT DETECTED   Coronavirus NL63 NOT DETECTED NOT DETECTED   Coronavirus OC43 NOT DETECTED NOT DETECTED   Metapneumovirus NOT DETECTED NOT DETECTED   Rhinovirus / Enterovirus NOT DETECTED NOT DETECTED   Influenza A NOT DETECTED NOT DETECTED   Influenza B NOT DETECTED NOT DETECTED   Parainfluenza Virus 1 NOT DETECTED NOT DETECTED   Parainfluenza Virus 2 NOT DETECTED NOT DETECTED   Parainfluenza Virus 3 NOT DETECTED NOT DETECTED   Parainfluenza Virus 4 NOT DETECTED NOT DETECTED   Respiratory Syncytial Virus NOT DETECTED NOT DETECTED   Bordetella pertussis NOT DETECTED NOT DETECTED   Bordetella Parapertussis NOT DETECTED NOT DETECTED   Chlamydophila pneumoniae NOT DETECTED NOT DETECTED   Mycoplasma pneumoniae NOT DETECTED NOT DETECTED    Comment: Performed at [facility], [address], [place], [place] [address]  Hepatic function panel     Status: Abnormal   Collection Time: 12/26/22  4:07 PM  Result Value Ref Range   Total Protein 6.4 (L) 6.5 - 8.1 g/dL   Albumin 3.4 (L) 3.5 - 5.0 g/dL   AST 42 (H) 15 - 41 U/L   ALT 20 0 - 44 U/L   Alkaline Phosphatase 58 38 - 126 U/L    Total Bilirubin 1.4 (H) 0.3 - 1.2 mg/dL   Bilirubin, Direct 0.5 (H) 0.0 - 0.2 mg/dL   Indirect Bilirubin 0.9 0.3 - 0.9 mg/dL    Comment: Performed at [facility], [address] [place], [place] [address]  CBC     Status: Abnormal   Collection Time: 12/27/22  6:06 AM  Result Value Ref Range   WBC 8.1 4.0 - 10.5 K/uL   RBC 4.67 4.22 - 5.81 MIL/uL   Hemoglobin 15.8 13.0 - 17.0 g/dL   HCT 91.4 78.2 - 95.6 %   MCV 102.6 (H) 80.0 - 100.0 fL   MCH 33.8 26.0 - 34.0 pg   MCHC 33.0 30.0 - 36.0 g/dL   RDW 21.3 08.6 - 57.8 %   Platelets 192 150 - 400 K/uL   nRBC 0.4 (H) 0.0 - 0.2 %    Comment: Performed at [facility], [address], [place], [place] [address]  Basic metabolic panel     Status: Abnormal   Collection Time: 12/27/22  6:06 AM  Result Value Ref Range   Sodium 135 135 - 145 mmol/L   Potassium 4.2 3.5 - 5.1 mmol/L   Chloride 102 98 - 111 mmol/L   CO2 20 (L) 22 - 32 mmol/L   Glucose, Bld 112 (H) 70 - 99 mg/dL    Comment: Glucose reference range applies only to samples taken after fasting for at least 8 hours.   BUN 50 (H) 8 - 23 mg/dL  Creatinine, Ser 2.41 (H) 0.61 - 1.24 mg/dL   Calcium 8.2 (L) 8.9 - 10.3 mg/dL   GFR, Estimated 29 (L) >60 mL/min    Comment: (NOTE) Calculated using the CKD-EPI Creatinine Equation (2021)    Anion gap 13 5 - 15    Comment: Performed at [facility], [address] [place], [place] [address]   DG CHEST PORT 1 VIEW  Result Date: 12/26/2022 CLINICAL DATA:  Fevers EXAM: PORTABLE CHEST 1 VIEW COMPARISON:  12/17/2022 FINDINGS: Cardiac shadow is enlarged but accentuated by the portable technique. Lungs are clear bilaterally. No bony abnormality is noted. IMPRESSION: No acute abnormality noted. Electronically Signed   By: Alcide Clever M.D.   On: 12/26/2022 11:48      Assessment/Plan Rash Punch biopsy  Patient seen and examined. Rash with significant bullae noted to trunk/chest,  bilateral upper and lower extremities. Punch biopsy completed from abdominal site. See separate procedure note for further details. He is on eliquis - continue pressure dressing today for hemostasis.  Specimen sent to pathology with rush order to expedite results.  CC to Dr. Isidoro Donning.  We will sign off at this time.   I reviewed hospitalist notes, last 24 h vitals and pain scores, last 48 h intake and output, last 24 h labs and trends, and last 24 h imaging results.   Letha Cape, [facility] Surgery 12/27/2022, 8:32 AM Please see Amion for pager number during day hours 7:00am-4:30pm

## 2022-12-27 NOTE — Progress Notes (Addendum)
[facility]                                                                              [name], is a 67 y.o. male, DOB - [birth year], WUJ:[id number] Admit date - 12/22/2022    Outpatient Primary MD for the patient is [name], FNP  LOS - 4  days  Chief Complaint  Patient presents with   Hand Pain       Brief summary   Patient is a 67 year old male with HTN, HLD, CAD, NSTEMI, nonischemic dilated cardiomyopathy, chronic systolic CHF (EF 20 to 25%), CKD 4, polysubstance abuse (cocaine, alcohol), anxiety, depression. 6/1, patient was seen in the ED with complaint of pain, redness and swelling of right hand for 2 days.  X-ray showed soft tissue swelling without osseous abnormality.  Patient was given 1 dose of IV Rocephin and discharged on oral cefuroxime and oxycodone. Patient returned back to the ED on 6/6 with worsening redness, pain in the left hand despite compliance with antibiotics.  Patient was noted to have left hand, forearm warm and swollen, decreased ROM left wrist, 1-2+ bilateral lower extremity edema, left worse than right Left hand x-ray showed marked soft tissue swelling of the hand, wrist, increased from prior without acute osseous abnormality.  Overnight 6/11: Patient developed increasing swelling and blisters, spreading to trunk, chest, abdomen and lower extremities.  Transferred to stepdown unit due to concern for TENS/SJS.  [facility] was consulted and requested biopsy before any transfer to burn center.  PCCM consulted. Surgery consulted for biopsy.  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  Transferred to stepdown unit due to concern for TENS/SJS.  [facility] was consulted and requested biopsy before any transfer to burn center.  PCCM consulted.  Possibly due to reaction to linezolid.   -Received Solu-Medrol IV 125 mg x 1, Benadryl, continue IV Solu-Medrol 40 mg every 8 hours -Surgery consulted for biopsy. -[facility] was consulted last night, requested biopsy before transfer to burn center, highly appreciate surgery team for expedited biopsy -ID following Addendum: 2:20 PM Surgery has completed the biopsy x2 and second specimen also sent per the request by pathology, biopsy results are pending -I contacted [facility] transfer line 1 [phone] for possible transfer to be evaluated by dermatology/burn center.  Awaiting the response back. 2:30PM: Received call back and gave full info, unit phone/fax #,  [facility] transfer coordinator is checking on the capacity. 3:25pm: Currently no beds but placed on wait list, awaiting dermatology callback  3:50pm: d/w dermatologist, Dr. Alfredo Bach at [facility], unfortunately she is not able to see the pictures on the media and pathology results are not back yet.  She is okay with the transfer, awaiting response from hospitalist service. - consulted ophthalmology, Dr Vonna Kotyk on call to r/o opthal involvement    Cellulitis of left hand -Venous Dopplers LUE negative for any DVT or SVT.   -Left hand forearm swelling had been improving -Due to overnight issue, antibiotics discontinued   Nonischemic  cardiomyopathy, acute on chronic combined systolic and diastolic CHF exacerbation, severe mitral regurgitation -Bilateral lower extremity edema noted, left worse than right -Venous Doppler lower extremities negative for DVT -Recent 2D echo 11/17/2022 had shown EF 20 to 25%, global hypokinesis, G2 DD, severe MR, apical thrombus (was discharged home on Lasix 40 mg twice a day on 5/5) -Lasix was increased to 60 mg IV twice daily on 6/9, decreased to 40 mg BID on 6/10.   -Creatinine trending up to 2.4 today, hold BB, Lasix, discontinued Entresto - CHF team was consulted 6/10 however per Dr. Sharyn Lull, patient is not a candidate for right heart cath due  to apical thrombus, recommended outpatient referral to CHF team  Apical thrombus -Recently seen on the 2D echo on 11/17/2022, continue eliquis    CKD (chronic kidney disease) stage 3b, GFR 30-59 ml/min (HCC) -Baseline creatinine 1.9-2.3 -Creatinine trended up to 2.4 today, holding Lasix Entresto and beta-blocker due to borderline BP  History of cocaine use -Counseled strongly on cocaine cessation -Urine drug screen was not obtained this admission  Essential hypertension -BP currently soft, holding BB, Lasix, Entresto   Obesity Estimated body mass index is 28.93 kg/m as calculated from the following:   Height as of this encounter: 5\' 8"  (1.727 m).   Weight as of this encounter: 86.3 kg.  Code Status: Full CODE STATUS DVT Prophylaxis:   apixaban (ELIQUIS) tablet 5 mg   Level of Care: Level of care: Stepdown Family Communication: Updated patient Disposition Plan:      Remains inpatient appropriate: Workup in progress   Procedures:  Punch biopsy by surgery 6/11  Consultants:   Cardiology PCCM ID General surgery   Antimicrobials:   Anti-infectives (From admission, onward)    Start     Dose/Rate Route Frequency Ordered Stop   12/26/22 1415  linezolid (ZYVOX) tablet 600 mg  Status:  Discontinued        600 mg Oral Every 12 hours 12/26/22 1316 12/27/22 0520   12/23/22 2000  cefTRIAXone (ROCEPHIN) 1 g in sodium chloride 0.9 % 100 mL IVPB  Status:  Discontinued        1 g 200 mL/hr over 30 Minutes Intravenous Every 24 hours 12/22/22 2224 12/23/22 0909   12/23/22 2000  cefTRIAXone (ROCEPHIN) 2 g in sodium chloride 0.9 % 100 mL IVPB  Status:  Discontinued        2 g 200 mL/hr over 30 Minutes Intravenous Every 24 hours 12/23/22 0909 12/27/22 0520   12/22/22 1945  vancomycin (VANCOREADY) IVPB 1750 mg/350 mL        1,750 mg 175 mL/hr over 120 Minutes Intravenous  Once 12/22/22 1941 12/22/22 2251   12/22/22 1915  cefTRIAXone (ROCEPHIN) 2 g in sodium chloride 0.9 % 100 mL IVPB         2 g 200 mL/hr over 30 Minutes Intravenous  Once 12/22/22 1913 12/22/22 2058          Medications  apixaban  5 mg Oral BID   atorvastatin  40 mg Oral QHS   furosemide  40 mg Intravenous BID   methylPREDNISolone (SOLU-MEDROL) injection  40 mg Intravenous Q8H   metoprolol succinate  12.5 mg Oral Daily   sacubitril-valsartan  1 tablet Oral BID   tamsulosin  0.4 mg Oral Daily      Subjective:   Deny Anzualda was seen and examined today.  Overnight events noted with blistering on trunk, chest, back, extremities and sloughing of the epithelium in some areas.  No  chest pain, fevers, nausea vomiting or diarrhea.  Objective:   Vitals:   12/27/22 0335 12/27/22 0424 12/27/22 0500 12/27/22 0600  BP: 95/65 110/76 104/70 112/72  Pulse: 77 81 81 77  Resp: 17 19 17  (!) 0  Temp: 98 F (36.7 C) 97.8 F (36.6 C)    TempSrc: Oral Oral    SpO2: 100% 95% 97% 98%  Weight:  86.3 kg    Height:  5\' 8"  (1.727 m)      Intake/Output Summary (Last 24 hours) at 12/27/2022 4540 Last data filed at 12/27/2022 0131 Gross per 24 hour  Intake 240 ml  Output 1050 ml  Net -810 ml     Wt Readings from Last 3 Encounters:  12/27/22 86.3 kg  12/17/22 87.1 kg  12/07/22 87.1 kg    Physical Exam General: Alert and oriented x 3, NAD Cardiovascular: S1 S2 clear, RRR.  Respiratory: CTAB Gastrointestinal: Soft, nontender, nondistended, NBS Ext: 1+ pedal edema bilaterally Neuro: no new deficits Skin: Blistering and bullous rash affecting the trunk, arms and lower extremities Psych: Normal affect         Data Reviewed:  I have personally reviewed following labs    CBC Lab Results  Component Value Date   WBC 8.1 12/27/2022   RBC 4.67 12/27/2022   HGB 15.8 12/27/2022   HCT 47.9 12/27/2022   MCV 102.6 (H) 12/27/2022   MCH 33.8 12/27/2022   PLT 192 12/27/2022   MCHC 33.0 12/27/2022   RDW 14.2 12/27/2022   LYMPHSABS 1.4 12/26/2022   MONOABS 0.3 12/26/2022   EOSABS 0.8 (H)  12/26/2022   BASOSABS 0.1 12/26/2022     Last metabolic panel Lab Results  Component Value Date   NA 135 12/27/2022   K 4.2 12/27/2022   CL 102 12/27/2022   CO2 20 (L) 12/27/2022   BUN 50 (H) 12/27/2022   CREATININE 2.41 (H) 12/27/2022   GLUCOSE 112 (H) 12/27/2022   GFRNONAA 29 (L) 12/27/2022   GFRAA 26 (L) 04/17/2020   CALCIUM 8.2 (L) 12/27/2022   PHOS 3.2 07/31/2016   PROT 6.4 (L) 12/26/2022   ALBUMIN 3.4 (L) 12/26/2022   LABGLOB 2.9 05/11/2015   AGRATIO 1.0 05/11/2015   BILITOT 1.4 (H) 12/26/2022   ALKPHOS 58 12/26/2022   AST 42 (H) 12/26/2022   ALT 20 12/26/2022   ANIONGAP 13 12/27/2022    CBG (last 3)  No results for input(s): "GLUCAP" in the last 72 hours.    Coagulation Profile: No results for input(s): "INR", "PROTIME" in the last 168 hours.   Radiology Studies: I have personally reviewed the imaging studies  DG CHEST PORT 1 VIEW  Result Date: 12/26/2022 CLINICAL DATA:  Fevers EXAM: PORTABLE CHEST 1 VIEW COMPARISON:  12/17/2022 FINDINGS: Cardiac shadow is enlarged but accentuated by the portable technique. Lungs are clear bilaterally. No bony abnormality is noted. IMPRESSION: No acute abnormality noted. Electronically Signed   By: Alcide Clever M.D.   On: 12/26/2022 11:48       Ithiel Liebler M.D. [facility] 12/27/2022, 7:12 AM  Available via Epic secure chat 7am-7pm After 7 pm, please refer to night coverage provider listed on amion.

## 2022-12-28 ENCOUNTER — Encounter (HOSPITAL_COMMUNITY): Payer: 59

## 2022-12-28 MED ORDER — HYDROMORPHONE HCL 1 MG/ML IJ SOLN
0.5000 mg | INTRAMUSCULAR | Status: DC | PRN
  Administered 2022-12-28: 1 mg via INTRAVENOUS
  Filled 2022-12-28: qty 1

## 2022-12-28 NOTE — Progress Notes (Signed)
[facility] transport here from [facility], report called to [facility] 604 receiving RN Maxine Glenn.   Asked patient if we can notify anyone of transport, and he refused.   All belongings in room sent with transport team.

## 2022-12-29 LAB — CULTURE, BLOOD (ROUTINE X 2): Culture: NO GROWTH

## 2022-12-30 ENCOUNTER — Other Ambulatory Visit (HOSPITAL_COMMUNITY): Payer: Self-pay

## 2022-12-30 ENCOUNTER — Telehealth: Payer: Self-pay

## 2022-12-30 LAB — CULTURE, BLOOD (ROUTINE X 2)

## 2022-12-30 NOTE — Telephone Encounter (Signed)
[facility] Patient Advocate Encounter  Insurance verification completed.    The patient is insured through [employer] and has a $0.00 copay.  We will continue to follow to see if copay assistance is needed.  Teague Goynes, CPhT [facility] Patient Advocate [facility] Phone: [phone] Fax:  [phone]  

## 2022-12-31 LAB — CULTURE, BLOOD (ROUTINE X 2)
Culture: NO GROWTH
Special Requests: ADEQUATE

## 2023-01-03 ENCOUNTER — Ambulatory Visit: Payer: 59 | Admitting: Family

## 2023-01-04 NOTE — Discharge Summary (Signed)
Physician Discharge Summary   Patient: [name] MRN: [id number] DOB: 1956/01/08  Admit date:     12/22/2022  Discharge date: 12/28/2022 AM  Discharge Physician: Thad Ranger, MD    PCP: Raymon Mutton., FNP    TRANSFER SUMMARY   [name] CSN:[id number],MRN:[id number]  is a 67 y.o. male  Outpatient Primary MD for the patient is Raymon Mutton., FNP Admission date: 12/22/2022 Transfer Date 12/28/2022 Admitting Physician Lorin Glass, MD   Place of Transfer: [facility] Accepting MD: Dr. 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 Cellulitis of the left hand Nonischemic cardiomyopathy, acute on chronic combined systolic and diastolic CHF Severe mitral regurgitation Apical thrombus AKI on CKD stage IIIb History of cocaine use Hypertension Obesity  Past Medical History:  Diagnosis Date   Anxiety    Arthritis    "all over" (01/07/2015)   CKD (chronic kidney disease), stage IV (HCC)    Depression    Headache    "probably weekly" (01/07/2015)   History of echocardiogram 12/2014   EF 20-25% -> improved up to EF 35 and 40% February 2020:   Homelessness    Hypercholesterolemia    Hypertension    Ischemic dilated cardiomyopathy (HCC)    Noncompliance    Nonischemic dilated cardiomyopathy (HCC) 07/2014   Initial EF was 2025% with global hypokinesis.  As of February 2020 EF up to 35-40%, GRII DD.  Global hypokinesis.  No WMA.  Relatively normal valves.   NSTEMI (non-ST elevated myocardial infarction) [facility]) 2016   2 separate episodes in January and June.  Normal coronary arteries by cardiac catheterization.  Thought to be related to.   Polysubstance abuse (HCC)    etoh, cocaine   Urinary hesitancy    Walking pneumonia 07/2014    Past Surgical  History:  Procedure Laterality Date   LEFT HEART CATHETERIZATION WITH CORONARY ANGIOGRAM N/A 08/15/2014   Procedure: LEFT HEART CATHETERIZATION WITH CORONARY ANGIOGRAM;  Surgeon: Robynn Pane, MD;  Location: [facility] CATH LAB;  Service: Cardiovascular;; normal coronary arteries.  Severely elevated LVEDP of 35 mmHg.   TRANSTHORACIC ECHOCARDIOGRAM  08/2018   EF 35-40% (up from 20-25% in 2017).  No MR WMA.  Global HK.  GRII DD.  Normal valves.    Consults infectious disease,                   critical care,                  Ophthalmology, Dr Vonna Kotyk                     Dermatology at [facility]  Patient is a 67 year old male with HTN, HLD, CAD, NSTEMI, nonischemic dilated cardiomyopathy, chronic systolic CHF (EF 20 to 25%), CKD 4, polysubstance abuse (cocaine, alcohol), anxiety, depression. 6/1, patient was seen in the ED with complaint of pain, redness and swelling of right hand for 2 days.  X-ray showed soft tissue swelling without osseous abnormality.  Patient was given 1 dose of IV Rocephin and discharged on oral cefuroxime and oxycodone. Patient returned back to the ED on 6/6 with worsening redness, pain in the left hand despite compliance with antibiotics.  Patient was noted to have left hand, forearm warm and swollen, decreased ROM  left wrist, 1-2+ bilateral lower extremity edema, left worse than right Left hand x-ray showed marked soft tissue swelling of the hand, wrist, increased from prior without acute osseous abnormality.   Overnight 6/11: Patient developed increasing swelling and blisters, spreading to trunk, chest, abdomen and lower extremities.  Transferred to stepdown unit due to concern for TENS/SJS.  [facility] was consulted and requested biopsy before any transfer to burn center.  Critical care consulted, Surgery consulted for biopsy.    Blistering and bullous rash, concerning for TENS/SJS - Overnight 6/11, patient developed increasing swelling and  blisters, spreading to trunk, chest, abdomen and lower extremities.  Transferred to stepdown unit due to concern for TENS/SJS.  All antibiotics were discontinued.  ID was following. -Critical care was consulted, received Solu-Medrol IV 125 mg x 1, Benadryl, continue IV Solu-Medrol 40 mg every 8 hours.  -Surgery was consulted for biopsy.  Biopsy results showed partial to full-thickness epidermal necrosis, consistent with SJS/TENS -Ophthalmology also consulted, seen by Dr.Bevis on 6/11, no conjunctival involvement or iritis at this time.  Was recommended ATS 4 times daily and Lacri-Lube at night. -[facility] was consulted emergently, discussed with dermatologist, Dr. Alfredo Bach at [facility].  Patient was accepted for transfer to [facility] for dermatology evaluation and treatment that is available there.     Cellulitis of left hand -Venous Dopplers LUE negative for any DVT or SVT.   -Left hand forearm swelling had been improving, per ID does not require any further antibiotics for cellulitis.    Nonischemic cardiomyopathy, acute on chronic combined systolic and diastolic CHF exacerbation, severe mitral regurgitation -Bilateral lower extremity edema noted, left worse than right -Venous Doppler lower extremities negative for DVT -Recent 2D echo 11/17/2022 had shown EF 20 to 25%, global hypokinesis, G2 DD, severe MR, apical thrombus (was discharged home on Lasix 40 mg twice a day on 5/5) -Lasix was increased to 60 mg IV twice daily on 6/9, decreased to 40 mg BID on 6/10.   -Creatinine trending up to 2.4 on the day of transfer with borderline BP, hence antihypertensives, Lasix, Entresto were held. - CHF team was consulted 6/10 however per Dr. Sharyn Lull, patient is not a candidate for right heart cath due to apical thrombus, recommended outpatient referral to CHF team   Apical thrombus -Recently seen on the 2D echo on 11/17/2022, continue eliquis     CKD (chronic kidney disease)  stage 3b, GFR 30-59 ml/min (HCC) -Baseline creatinine 1.9-2.3 -Creatinine trended up to 2.4, Lasix, Entresto, beta-blocker held due to borderline BP   History of cocaine use -Counseled strongly on cocaine cessation -Urine drug screen was not obtained this admission   Essential hypertension -BP soft, beta-blocker, Lasix, Entresto held     Obesity Estimated body mass index is 28.93 kg/m as calculated from the following:   Height as of this encounter: 5\' 8"  (1.727 m).   Weight as of this encounter: 86.3 kg.  Data Review  CBC w Diff:  Lab Results  Component Value Date   WBC 8.1 12/27/2022   HGB 15.8 12/27/2022   HGB 13.4 09/20/2018   HCT 47.9 12/27/2022   HCT 40.3 09/20/2018   PLT 192 12/27/2022   PLT 191 09/20/2018   LYMPHOPCT 13 12/26/2022   MONOPCT 3 12/26/2022   EOSPCT 8 12/26/2022   BASOPCT 1 12/26/2022   CMP:  Lab Results  Component Value Date   Joseph 135 12/27/2022   Joseph 139 09/20/2018   K 4.2 12/27/2022   CL  102 12/27/2022   CO2 20 (L) 12/27/2022   BUN 50 (H) 12/27/2022   BUN 27 09/20/2018   CREATININE 2.41 (H) 12/27/2022   CREATININE 1.70 (H) 10/14/2015   PROT 6.4 (L) 12/26/2022   ALBUMIN 3.4 (L) 12/26/2022   BILITOT 1.4 (H) 12/26/2022   ALKPHOS 58 12/26/2022   AST 42 (H) 12/26/2022   ALT 20 12/26/2022  .  Significant Tests   Significant Diagnostic Studies:  VAS [place] UPPER EXTREMITY VENOUS DUPLEX  Result Date: 12/24/2022 UPPER VENOUS STUDY  Patient Name:  MOHAMEDAMIN RIEDEMANN  Date of Exam:   12/23/2022 Medical Rec #: [id number]       Accession #:    [id number] Date of Birth: [birth year]      Patient Gender: M Patient Age:   43 years Exam Location:  [facility] Procedure:      VAS [place] UPPER EXTREMITY VENOUS DUPLEX Referring Phys: Lorin Glass --------------------------------------------------------------------------------  Indications: Swelling, Pain, and Erythema Comparison Study: No previous study. Performing Technologist: McKayla Maag RVT, [place]  Examination  Guidelines: A complete evaluation includes B-mode imaging, spectral Doppler, color Doppler, and power Doppler as needed of all accessible portions of each vessel. Bilateral testing is considered an integral part of a complete examination. Limited examinations for reoccurring indications may be performed as noted.  Right Findings: +----------+------------+---------+-----------+----------+-------+ RIGHT     CompressiblePhasicitySpontaneousPropertiesSummary +----------+------------+---------+-----------+----------+-------+ Subclavian    Full       Yes       Yes                      +----------+------------+---------+-----------+----------+-------+  Left Findings: +----------+------------+---------+-----------+----------+-------+ LEFT      CompressiblePhasicitySpontaneousPropertiesSummary +----------+------------+---------+-----------+----------+-------+ IJV           Full       Yes       Yes                      +----------+------------+---------+-----------+----------+-------+ Subclavian    Full       Yes       Yes                      +----------+------------+---------+-----------+----------+-------+ Axillary      Full       Yes       Yes                      +----------+------------+---------+-----------+----------+-------+ Brachial      Full       Yes       Yes                      +----------+------------+---------+-----------+----------+-------+ Radial        Full                                          +----------+------------+---------+-----------+----------+-------+ Ulnar         Full                                          +----------+------------+---------+-----------+----------+-------+ Cephalic      Full                                          +----------+------------+---------+-----------+----------+-------+  Basilic       Full                                           +----------+------------+---------+-----------+----------+-------+  Summary:  Right: No evidence of thrombosis in the subclavian.  Left: No evidence of deep vein thrombosis in the upper extremity. No evidence of superficial vein thrombosis in the upper extremity.  *See table(s) above for measurements and observations.  Diagnosing physician: Gerarda Fraction Electronically signed by Gerarda Fraction on 12/24/2022 at 9:35:15 AM.    Final    VAS [place] LOWER EXTREMITY VENOUS (DVT)  Result Date: 12/24/2022  Lower Venous DVT Study Patient Name:  SHADMAN ARMSTEAD  Date of Exam:   12/23/2022 Medical Rec #: [id number]       Accession #:    [id number] Date of Birth: [birth year]      Patient Gender: M Patient Age:   76 years Exam Location:  [facility] Procedure:      VAS [place] LOWER EXTREMITY VENOUS (DVT) Referring Phys: Cayson Kalb --------------------------------------------------------------------------------  Indications: Edema.  Comparison Study: No previous study. Performing Technologist: McKayla Maag RVT, [place]  Examination Guidelines: A complete evaluation includes B-mode imaging, spectral Doppler, color Doppler, and power Doppler as needed of all accessible portions of each vessel. Bilateral testing is considered an integral part of a complete examination. Limited examinations for reoccurring indications may be performed as noted. The reflux portion of the exam is performed with the patient in reverse Trendelenburg.  +---------+---------------+---------+-----------+----------+--------------+ RIGHT    CompressibilityPhasicitySpontaneityPropertiesThrombus Aging +---------+---------------+---------+-----------+----------+--------------+ CFV      Full           Yes      Yes                                 +---------+---------------+---------+-----------+----------+--------------+ SFJ      Full                                                         +---------+---------------+---------+-----------+----------+--------------+ FV Prox  Full                                                        +---------+---------------+---------+-----------+----------+--------------+ FV Mid   Full                                                        +---------+---------------+---------+-----------+----------+--------------+ FV DistalFull                                                        +---------+---------------+---------+-----------+----------+--------------+ PFV      Full                                                        +---------+---------------+---------+-----------+----------+--------------+  POP      Full           Yes      Yes                                 +---------+---------------+---------+-----------+----------+--------------+ PTV      Full                                                        +---------+---------------+---------+-----------+----------+--------------+ PERO     Full                                                        +---------+---------------+---------+-----------+----------+--------------+   +---------+---------------+---------+-----------+----------+--------------+ LEFT     CompressibilityPhasicitySpontaneityPropertiesThrombus Aging +---------+---------------+---------+-----------+----------+--------------+ CFV      Full           Yes      Yes                                 +---------+---------------+---------+-----------+----------+--------------+ SFJ      Full                                                        +---------+---------------+---------+-----------+----------+--------------+ FV Prox  Full                                                        +---------+---------------+---------+-----------+----------+--------------+ FV Mid   Full                                                         +---------+---------------+---------+-----------+----------+--------------+ FV DistalFull                                                        +---------+---------------+---------+-----------+----------+--------------+ PFV      Full                                                        +---------+---------------+---------+-----------+----------+--------------+ POP      Full           Yes      Yes                                 +---------+---------------+---------+-----------+----------+--------------+  PTV      Full                                                        +---------+---------------+---------+-----------+----------+--------------+ PERO     Full                                                        +---------+---------------+---------+-----------+----------+--------------+     Summary: BILATERAL: - No evidence of deep vein thrombosis seen in the lower extremities, bilaterally. - No evidence of superficial venous thrombosis in the lower extremities, bilaterally. -No evidence of popliteal cyst, bilaterally.   *See table(s) above for measurements and observations. Electronically signed by Gerarda Fraction on 12/24/2022 at 9:35:02 AM.    Final    DG Hand Complete Left  Result Date: 12/22/2022 CLINICAL DATA:  Swelling EXAM: LEFT HAND - COMPLETE 3+ VIEW COMPARISON:  Left wrist x-ray 12/20/2022 FINDINGS: There is marked soft tissue swelling of the hand and wrist, increased from prior. There is no radiopaque foreign body. There is no cortical erosion. There is no acute fracture or dislocation. IMPRESSION: Marked soft tissue swelling of the hand and wrist, increased from prior. No acute osseous abnormality. Electronically Signed   By: Darliss Cheney M.D.   On: 12/22/2022 19:58    2D ECHO:    Scheduled Meds: Continuous Infusions:     The risks and  Benefits of transporting including disability, death and ...Marland KitchenMarland KitchenMarland Kitchen were discussed with the patient or health power of  attorney and were agreeable to the plan and further management.  Total Time in preparing paper work, todays exam and data evaluation: 35 minutes  Signed: Thad Ranger M.D. [facility] 01/04/2023, 2:22 PM

## 2023-01-12 ENCOUNTER — Encounter (HOSPITAL_COMMUNITY): Payer: 59 | Admitting: Cardiology
